# Patient Record
Sex: Female | Born: 1955 | Race: Black or African American | Hispanic: No | Smoking: Current every day smoker
Health system: Southern US, Community
[De-identification: ages and names within clinical notes are randomized; demographics above are authoritative.]

## PROBLEM LIST (undated history)

## (undated) ENCOUNTER — Inpatient Hospital Stay (HOSPITAL_COMMUNITY): Payer: Self-pay

## (undated) ENCOUNTER — Emergency Department (HOSPITAL_BASED_OUTPATIENT_CLINIC_OR_DEPARTMENT_OTHER): Admission: EM | Payer: PRIVATE HEALTH INSURANCE

## (undated) DIAGNOSIS — G4730 Sleep apnea, unspecified: Secondary | ICD-10-CM

## (undated) DIAGNOSIS — M1990 Unspecified osteoarthritis, unspecified site: Secondary | ICD-10-CM

## (undated) DIAGNOSIS — K5900 Constipation, unspecified: Secondary | ICD-10-CM

## (undated) DIAGNOSIS — K219 Gastro-esophageal reflux disease without esophagitis: Secondary | ICD-10-CM

## (undated) DIAGNOSIS — D649 Anemia, unspecified: Secondary | ICD-10-CM

## (undated) DIAGNOSIS — R1310 Dysphagia, unspecified: Secondary | ICD-10-CM

## (undated) DIAGNOSIS — E119 Type 2 diabetes mellitus without complications: Secondary | ICD-10-CM

## (undated) DIAGNOSIS — M351 Other overlap syndromes: Secondary | ICD-10-CM

## (undated) DIAGNOSIS — IMO0002 Reserved for concepts with insufficient information to code with codable children: Secondary | ICD-10-CM

## (undated) DIAGNOSIS — E559 Vitamin D deficiency, unspecified: Secondary | ICD-10-CM

## (undated) DIAGNOSIS — F419 Anxiety disorder, unspecified: Secondary | ICD-10-CM

## (undated) DIAGNOSIS — M810 Age-related osteoporosis without current pathological fracture: Secondary | ICD-10-CM

## (undated) DIAGNOSIS — F329 Major depressive disorder, single episode, unspecified: Secondary | ICD-10-CM

## (undated) DIAGNOSIS — G40909 Epilepsy, unspecified, not intractable, without status epilepticus: Secondary | ICD-10-CM

## (undated) DIAGNOSIS — R079 Chest pain, unspecified: Secondary | ICD-10-CM

## (undated) DIAGNOSIS — K259 Gastric ulcer, unspecified as acute or chronic, without hemorrhage or perforation: Secondary | ICD-10-CM

## (undated) DIAGNOSIS — M549 Dorsalgia, unspecified: Secondary | ICD-10-CM

## (undated) DIAGNOSIS — E079 Disorder of thyroid, unspecified: Secondary | ICD-10-CM

## (undated) DIAGNOSIS — R600 Localized edema: Secondary | ICD-10-CM

## (undated) DIAGNOSIS — M2550 Pain in unspecified joint: Secondary | ICD-10-CM

## (undated) DIAGNOSIS — E785 Hyperlipidemia, unspecified: Secondary | ICD-10-CM

## (undated) DIAGNOSIS — T7840XA Allergy, unspecified, initial encounter: Secondary | ICD-10-CM

## (undated) DIAGNOSIS — I10 Essential (primary) hypertension: Secondary | ICD-10-CM

## (undated) DIAGNOSIS — F32A Depression, unspecified: Secondary | ICD-10-CM

## (undated) HISTORY — DX: Hyperlipidemia, unspecified: E78.5

## (undated) HISTORY — DX: Anemia, unspecified: D64.9

## (undated) HISTORY — DX: Chest pain, unspecified: R07.9

## (undated) HISTORY — DX: Epilepsy, unspecified, not intractable, without status epilepticus: G40.909

## (undated) HISTORY — DX: Age-related osteoporosis without current pathological fracture: M81.0

## (undated) HISTORY — DX: Major depressive disorder, single episode, unspecified: F32.9

## (undated) HISTORY — DX: Localized edema: R60.0

## (undated) HISTORY — DX: Disorder of thyroid, unspecified: E07.9

## (undated) HISTORY — DX: Type 2 diabetes mellitus without complications: E11.9

## (undated) HISTORY — DX: Other overlap syndromes: M35.1

## (undated) HISTORY — DX: Dorsalgia, unspecified: M54.9

## (undated) HISTORY — DX: Allergy, unspecified, initial encounter: T78.40XA

## (undated) HISTORY — DX: Sleep apnea, unspecified: G47.30

## (undated) HISTORY — PX: CERVICAL SPINE SURGERY: SHX589

## (undated) HISTORY — DX: Dysphagia, unspecified: R13.10

## (undated) HISTORY — DX: Reserved for concepts with insufficient information to code with codable children: IMO0002

## (undated) HISTORY — DX: Pain in unspecified joint: M25.50

## (undated) HISTORY — DX: Gastric ulcer, unspecified as acute or chronic, without hemorrhage or perforation: K25.9

## (undated) HISTORY — DX: Essential (primary) hypertension: I10

## (undated) HISTORY — DX: Constipation, unspecified: K59.00

## (undated) HISTORY — DX: Depression, unspecified: F32.A

## (undated) HISTORY — DX: Vitamin D deficiency, unspecified: E55.9

## (undated) HISTORY — DX: Anxiety disorder, unspecified: F41.9

## (undated) HISTORY — DX: Unspecified osteoarthritis, unspecified site: M19.90

## (undated) HISTORY — PX: CARPAL TUNNEL RELEASE: SHX101

## (undated) HISTORY — PX: LEG SURGERY: SHX1003

## (undated) HISTORY — DX: Gastro-esophageal reflux disease without esophagitis: K21.9

---

## 1998-06-06 ENCOUNTER — Ambulatory Visit (HOSPITAL_COMMUNITY): Admission: RE | Admit: 1998-06-06 | Discharge: 1998-06-06 | Payer: Self-pay | Admitting: Endocrinology

## 1998-08-01 ENCOUNTER — Ambulatory Visit (HOSPITAL_COMMUNITY): Admission: RE | Admit: 1998-08-01 | Discharge: 1998-08-01 | Payer: Self-pay

## 1998-08-03 ENCOUNTER — Observation Stay (HOSPITAL_COMMUNITY): Admission: RE | Admit: 1998-08-03 | Discharge: 1998-08-04 | Payer: Self-pay

## 1999-07-11 ENCOUNTER — Ambulatory Visit (HOSPITAL_COMMUNITY): Admission: RE | Admit: 1999-07-11 | Discharge: 1999-07-11 | Payer: Self-pay | Admitting: Orthopedic Surgery

## 1999-07-11 ENCOUNTER — Encounter: Payer: Self-pay | Admitting: Orthopedic Surgery

## 1999-08-17 ENCOUNTER — Encounter: Payer: Self-pay | Admitting: Neurosurgery

## 1999-08-17 ENCOUNTER — Ambulatory Visit (HOSPITAL_COMMUNITY): Admission: RE | Admit: 1999-08-17 | Discharge: 1999-08-17 | Payer: Self-pay | Admitting: Neurosurgery

## 1999-09-25 ENCOUNTER — Observation Stay (HOSPITAL_COMMUNITY): Admission: RE | Admit: 1999-09-25 | Discharge: 1999-09-26 | Payer: Self-pay | Admitting: Neurosurgery

## 1999-09-25 ENCOUNTER — Encounter: Payer: Self-pay | Admitting: Neurosurgery

## 1999-10-30 ENCOUNTER — Ambulatory Visit (HOSPITAL_COMMUNITY): Admission: RE | Admit: 1999-10-30 | Discharge: 1999-10-30 | Payer: Self-pay | Admitting: Neurosurgery

## 1999-10-30 ENCOUNTER — Encounter: Payer: Self-pay | Admitting: Neurosurgery

## 1999-12-03 ENCOUNTER — Ambulatory Visit (HOSPITAL_COMMUNITY): Admission: RE | Admit: 1999-12-03 | Discharge: 1999-12-03 | Payer: Self-pay | Admitting: Neurosurgery

## 1999-12-03 ENCOUNTER — Encounter: Payer: Self-pay | Admitting: Neurosurgery

## 2000-01-03 ENCOUNTER — Other Ambulatory Visit: Admission: RE | Admit: 2000-01-03 | Discharge: 2000-01-03 | Payer: Self-pay | Admitting: Family Medicine

## 2000-04-07 ENCOUNTER — Other Ambulatory Visit: Admission: RE | Admit: 2000-04-07 | Discharge: 2000-04-07 | Payer: Self-pay | Admitting: Family Medicine

## 2000-05-27 ENCOUNTER — Other Ambulatory Visit: Admission: RE | Admit: 2000-05-27 | Discharge: 2000-05-27 | Payer: Self-pay | Admitting: Obstetrics and Gynecology

## 2000-05-27 ENCOUNTER — Encounter (INDEPENDENT_AMBULATORY_CARE_PROVIDER_SITE_OTHER): Payer: Self-pay | Admitting: Specialist

## 2000-08-09 ENCOUNTER — Ambulatory Visit (HOSPITAL_COMMUNITY): Admission: RE | Admit: 2000-08-09 | Discharge: 2000-08-09 | Payer: Self-pay | Admitting: Obstetrics and Gynecology

## 2000-08-09 ENCOUNTER — Encounter (INDEPENDENT_AMBULATORY_CARE_PROVIDER_SITE_OTHER): Payer: Self-pay

## 2000-12-26 ENCOUNTER — Other Ambulatory Visit: Admission: RE | Admit: 2000-12-26 | Discharge: 2000-12-26 | Payer: Self-pay | Admitting: Family Medicine

## 2001-02-04 ENCOUNTER — Other Ambulatory Visit: Admission: RE | Admit: 2001-02-04 | Discharge: 2001-02-04 | Payer: Self-pay | Admitting: Obstetrics and Gynecology

## 2001-03-11 ENCOUNTER — Encounter: Payer: Self-pay | Admitting: Gastroenterology

## 2001-03-11 ENCOUNTER — Encounter: Admission: RE | Admit: 2001-03-11 | Discharge: 2001-03-11 | Payer: Self-pay | Admitting: Otolaryngology

## 2001-03-11 ENCOUNTER — Encounter: Payer: Self-pay | Admitting: Otolaryngology

## 2001-03-13 ENCOUNTER — Encounter: Admission: RE | Admit: 2001-03-13 | Discharge: 2001-03-13 | Payer: Self-pay | Admitting: Neurosurgery

## 2001-03-13 ENCOUNTER — Encounter: Payer: Self-pay | Admitting: Neurosurgery

## 2002-06-19 ENCOUNTER — Ambulatory Visit (HOSPITAL_COMMUNITY): Admission: RE | Admit: 2002-06-19 | Discharge: 2002-06-19 | Payer: Self-pay | Admitting: Family Medicine

## 2002-06-19 ENCOUNTER — Encounter: Payer: Self-pay | Admitting: Family Medicine

## 2003-03-10 ENCOUNTER — Encounter: Admission: RE | Admit: 2003-03-10 | Discharge: 2003-03-10 | Payer: Self-pay | Admitting: Family Medicine

## 2003-03-10 ENCOUNTER — Encounter: Payer: Self-pay | Admitting: Family Medicine

## 2003-05-11 ENCOUNTER — Encounter: Payer: Self-pay | Admitting: Orthopedic Surgery

## 2003-05-12 ENCOUNTER — Inpatient Hospital Stay (HOSPITAL_COMMUNITY): Admission: RE | Admit: 2003-05-12 | Discharge: 2003-05-13 | Payer: Self-pay | Admitting: Orthopedic Surgery

## 2004-05-18 ENCOUNTER — Encounter: Admission: RE | Admit: 2004-05-18 | Discharge: 2004-08-16 | Payer: Self-pay | Admitting: Orthopaedic Surgery

## 2004-10-03 ENCOUNTER — Ambulatory Visit (HOSPITAL_COMMUNITY): Admission: RE | Admit: 2004-10-03 | Discharge: 2004-10-03 | Payer: Self-pay | Admitting: Neurosurgery

## 2004-10-05 ENCOUNTER — Ambulatory Visit (HOSPITAL_COMMUNITY): Admission: RE | Admit: 2004-10-05 | Discharge: 2004-10-05 | Payer: Self-pay | Admitting: Neurosurgery

## 2004-10-09 ENCOUNTER — Ambulatory Visit: Payer: Self-pay | Admitting: Family Medicine

## 2004-10-15 ENCOUNTER — Ambulatory Visit: Payer: Self-pay | Admitting: Family Medicine

## 2004-11-02 ENCOUNTER — Ambulatory Visit: Payer: Self-pay | Admitting: Family Medicine

## 2004-11-13 ENCOUNTER — Ambulatory Visit (HOSPITAL_COMMUNITY): Admission: RE | Admit: 2004-11-13 | Discharge: 2004-11-13 | Payer: Self-pay | Admitting: Neurosurgery

## 2004-12-12 ENCOUNTER — Ambulatory Visit (HOSPITAL_COMMUNITY): Admission: RE | Admit: 2004-12-12 | Discharge: 2004-12-12 | Payer: Self-pay | Admitting: Neurosurgery

## 2004-12-25 ENCOUNTER — Ambulatory Visit: Payer: Self-pay | Admitting: Family Medicine

## 2004-12-26 ENCOUNTER — Ambulatory Visit (HOSPITAL_BASED_OUTPATIENT_CLINIC_OR_DEPARTMENT_OTHER): Admission: RE | Admit: 2004-12-26 | Discharge: 2004-12-26 | Payer: Self-pay | Admitting: General Surgery

## 2004-12-26 ENCOUNTER — Encounter (INDEPENDENT_AMBULATORY_CARE_PROVIDER_SITE_OTHER): Payer: Self-pay | Admitting: Specialist

## 2004-12-26 ENCOUNTER — Ambulatory Visit (HOSPITAL_COMMUNITY): Admission: RE | Admit: 2004-12-26 | Discharge: 2004-12-26 | Payer: Self-pay | Admitting: General Surgery

## 2005-03-14 ENCOUNTER — Ambulatory Visit: Payer: Self-pay | Admitting: Family Medicine

## 2005-03-22 ENCOUNTER — Ambulatory Visit: Payer: Self-pay | Admitting: Family Medicine

## 2005-04-22 ENCOUNTER — Ambulatory Visit: Payer: Self-pay | Admitting: Family Medicine

## 2005-05-22 ENCOUNTER — Inpatient Hospital Stay (HOSPITAL_COMMUNITY): Admission: RE | Admit: 2005-05-22 | Discharge: 2005-05-26 | Payer: Self-pay | Admitting: Neurosurgery

## 2005-08-29 ENCOUNTER — Encounter: Admission: RE | Admit: 2005-08-29 | Discharge: 2005-08-29 | Payer: Self-pay | Admitting: Neurosurgery

## 2005-09-18 ENCOUNTER — Ambulatory Visit: Payer: Self-pay | Admitting: Family Medicine

## 2005-10-31 ENCOUNTER — Ambulatory Visit: Payer: Self-pay | Admitting: Family Medicine

## 2005-11-11 ENCOUNTER — Encounter: Admission: RE | Admit: 2005-11-11 | Discharge: 2005-11-11 | Payer: Self-pay | Admitting: Family Medicine

## 2005-11-15 ENCOUNTER — Encounter: Admission: RE | Admit: 2005-11-15 | Discharge: 2005-12-31 | Payer: Self-pay | Admitting: Neurosurgery

## 2005-11-29 ENCOUNTER — Encounter: Admission: RE | Admit: 2005-11-29 | Discharge: 2005-11-29 | Payer: Self-pay | Admitting: Family Medicine

## 2006-01-16 ENCOUNTER — Encounter: Admission: RE | Admit: 2006-01-16 | Discharge: 2006-04-16 | Payer: Self-pay | Admitting: *Deleted

## 2006-01-23 ENCOUNTER — Ambulatory Visit: Payer: Self-pay | Admitting: Family Medicine

## 2006-02-06 ENCOUNTER — Encounter: Admission: RE | Admit: 2006-02-06 | Discharge: 2006-02-20 | Payer: Self-pay | Admitting: Family Medicine

## 2006-02-17 ENCOUNTER — Ambulatory Visit: Payer: Self-pay | Admitting: Family Medicine

## 2006-03-31 ENCOUNTER — Ambulatory Visit: Payer: Self-pay | Admitting: Family Medicine

## 2006-07-25 ENCOUNTER — Ambulatory Visit: Payer: Self-pay | Admitting: Family Medicine

## 2006-08-15 ENCOUNTER — Emergency Department (HOSPITAL_COMMUNITY): Admission: EM | Admit: 2006-08-15 | Discharge: 2006-08-15 | Payer: Self-pay | Admitting: Emergency Medicine

## 2006-10-17 ENCOUNTER — Ambulatory Visit: Payer: Self-pay | Admitting: Family Medicine

## 2006-10-20 ENCOUNTER — Ambulatory Visit: Payer: Self-pay | Admitting: Family Medicine

## 2006-10-20 LAB — CONVERTED CEMR LAB
BUN: 8 mg/dL (ref 6–23)
Basophils Absolute: 0 10*3/uL (ref 0.0–0.1)
Basophils Relative: 0.5 % (ref 0.0–1.0)
CO2: 30 meq/L (ref 19–32)
Calcium: 9.2 mg/dL (ref 8.4–10.5)
Chloride: 111 meq/L (ref 96–112)
Creatinine, Ser: 0.7 mg/dL (ref 0.4–1.2)
Eosinophil percent: 1.6 % (ref 0.0–5.0)
GFR calc non Af Amer: 94 mL/min
Glomerular Filtration Rate, Af Am: 113 mL/min/{1.73_m2}
Glucose, Bld: 90 mg/dL (ref 70–99)
HCT: 38.9 % (ref 36.0–46.0)
Hemoglobin: 12.7 g/dL (ref 12.0–15.0)
Lymphocytes Relative: 37.8 % (ref 12.0–46.0)
MCHC: 32.6 g/dL (ref 30.0–36.0)
MCV: 85.6 fL (ref 78.0–100.0)
Monocytes Absolute: 0.6 10*3/uL (ref 0.2–0.7)
Monocytes Relative: 7.9 % (ref 3.0–11.0)
Neutro Abs: 3.8 10*3/uL (ref 1.4–7.7)
Neutrophils Relative %: 52.2 % (ref 43.0–77.0)
Platelets: 312 10*3/uL (ref 150–400)
Potassium: 3.5 meq/L (ref 3.5–5.1)
RBC: 4.54 M/uL (ref 3.87–5.11)
RDW: 13.4 % (ref 11.5–14.6)
Sodium: 145 meq/L (ref 135–145)
WBC: 7.3 10*3/uL (ref 4.5–10.5)

## 2006-11-06 ENCOUNTER — Observation Stay (HOSPITAL_COMMUNITY): Admission: EM | Admit: 2006-11-06 | Discharge: 2006-11-07 | Payer: Self-pay | Admitting: Emergency Medicine

## 2006-11-06 ENCOUNTER — Ambulatory Visit: Payer: Self-pay | Admitting: Endocrinology

## 2006-11-12 ENCOUNTER — Ambulatory Visit: Payer: Self-pay | Admitting: Family Medicine

## 2006-11-14 ENCOUNTER — Ambulatory Visit: Payer: Self-pay

## 2006-12-25 ENCOUNTER — Ambulatory Visit: Payer: Self-pay | Admitting: Pulmonary Disease

## 2006-12-31 ENCOUNTER — Ambulatory Visit (HOSPITAL_COMMUNITY): Admission: RE | Admit: 2006-12-31 | Discharge: 2006-12-31 | Payer: Self-pay | Admitting: Pulmonary Disease

## 2007-01-06 ENCOUNTER — Ambulatory Visit: Payer: Self-pay | Admitting: Internal Medicine

## 2007-01-22 ENCOUNTER — Ambulatory Visit: Payer: Self-pay | Admitting: Family Medicine

## 2007-01-22 LAB — CONVERTED CEMR LAB: TSH: 0.69 microintl units/mL (ref 0.35–5.50)

## 2007-02-12 ENCOUNTER — Ambulatory Visit: Payer: Self-pay | Admitting: Family Medicine

## 2007-02-20 ENCOUNTER — Encounter: Payer: Self-pay | Admitting: Family Medicine

## 2007-02-20 ENCOUNTER — Ambulatory Visit: Payer: Self-pay | Admitting: Pulmonary Disease

## 2007-04-30 ENCOUNTER — Ambulatory Visit: Payer: Self-pay | Admitting: Family Medicine

## 2007-04-30 DIAGNOSIS — G4700 Insomnia, unspecified: Secondary | ICD-10-CM

## 2007-04-30 DIAGNOSIS — R1011 Right upper quadrant pain: Secondary | ICD-10-CM

## 2007-04-30 DIAGNOSIS — H81399 Other peripheral vertigo, unspecified ear: Secondary | ICD-10-CM | POA: Insufficient documentation

## 2007-04-30 DIAGNOSIS — Z8679 Personal history of other diseases of the circulatory system: Secondary | ICD-10-CM | POA: Insufficient documentation

## 2007-04-30 HISTORY — DX: Insomnia, unspecified: G47.00

## 2007-04-30 HISTORY — DX: Right upper quadrant pain: R10.11

## 2007-04-30 HISTORY — DX: Other peripheral vertigo, unspecified ear: H81.399

## 2007-05-08 ENCOUNTER — Encounter: Admission: RE | Admit: 2007-05-08 | Discharge: 2007-05-08 | Payer: Self-pay | Admitting: Family Medicine

## 2007-05-08 ENCOUNTER — Encounter: Payer: Self-pay | Admitting: Family Medicine

## 2007-05-11 ENCOUNTER — Telehealth (INDEPENDENT_AMBULATORY_CARE_PROVIDER_SITE_OTHER): Payer: Self-pay | Admitting: *Deleted

## 2007-05-15 ENCOUNTER — Encounter: Admission: RE | Admit: 2007-05-15 | Discharge: 2007-05-21 | Payer: Self-pay | Admitting: Family Medicine

## 2007-05-15 ENCOUNTER — Encounter: Payer: Self-pay | Admitting: Family Medicine

## 2007-07-16 ENCOUNTER — Ambulatory Visit: Payer: Self-pay | Admitting: Family Medicine

## 2007-07-16 DIAGNOSIS — M545 Low back pain, unspecified: Secondary | ICD-10-CM | POA: Insufficient documentation

## 2007-07-16 DIAGNOSIS — K219 Gastro-esophageal reflux disease without esophagitis: Secondary | ICD-10-CM

## 2007-07-16 DIAGNOSIS — M25569 Pain in unspecified knee: Secondary | ICD-10-CM

## 2007-07-16 DIAGNOSIS — R634 Abnormal weight loss: Secondary | ICD-10-CM

## 2007-07-16 DIAGNOSIS — K449 Diaphragmatic hernia without obstruction or gangrene: Secondary | ICD-10-CM

## 2007-07-16 DIAGNOSIS — M359 Systemic involvement of connective tissue, unspecified: Secondary | ICD-10-CM

## 2007-07-16 HISTORY — DX: Diaphragmatic hernia without obstruction or gangrene: K44.9

## 2007-07-16 HISTORY — DX: Pain in unspecified knee: M25.569

## 2007-07-16 HISTORY — DX: Gastro-esophageal reflux disease without esophagitis: K21.9

## 2007-07-16 HISTORY — DX: Low back pain, unspecified: M54.50

## 2007-07-17 ENCOUNTER — Ambulatory Visit: Payer: Self-pay | Admitting: Family Medicine

## 2007-07-20 ENCOUNTER — Encounter (INDEPENDENT_AMBULATORY_CARE_PROVIDER_SITE_OTHER): Payer: Self-pay | Admitting: *Deleted

## 2007-07-20 ENCOUNTER — Telehealth: Payer: Self-pay | Admitting: Family Medicine

## 2007-07-20 LAB — CONVERTED CEMR LAB
ALT: 15 units/L (ref 0–35)
AST: 20 units/L (ref 0–37)
Albumin: 4.1 g/dL (ref 3.5–5.2)
Alkaline Phosphatase: 81 units/L (ref 39–117)
BUN: 9 mg/dL (ref 6–23)
Basophils Absolute: 0 10*3/uL (ref 0.0–0.1)
Basophils Relative: 0.4 % (ref 0.0–1.0)
Bilirubin, Direct: 0.1 mg/dL (ref 0.0–0.3)
CO2: 31 meq/L (ref 19–32)
Calcium: 9.8 mg/dL (ref 8.4–10.5)
Chloride: 105 meq/L (ref 96–112)
Creatinine, Ser: 0.6 mg/dL (ref 0.4–1.2)
Eosinophils Absolute: 0.2 10*3/uL (ref 0.0–0.6)
Eosinophils Relative: 3.8 % (ref 0.0–5.0)
GFR calc Af Amer: 136 mL/min
GFR calc non Af Amer: 112 mL/min
Glucose, Bld: 83 mg/dL (ref 70–99)
HCT: 33.4 % — ABNORMAL LOW (ref 36.0–46.0)
Hemoglobin: 11.7 g/dL — ABNORMAL LOW (ref 12.0–15.0)
Lymphocytes Relative: 43.8 % (ref 12.0–46.0)
MCHC: 34.9 g/dL (ref 30.0–36.0)
MCV: 80.3 fL (ref 78.0–100.0)
Monocytes Absolute: 0.5 10*3/uL (ref 0.2–0.7)
Monocytes Relative: 9.5 % (ref 3.0–11.0)
Neutro Abs: 2 10*3/uL (ref 1.4–7.7)
Neutrophils Relative %: 42.5 % — ABNORMAL LOW (ref 43.0–77.0)
Platelets: 299 10*3/uL (ref 150–400)
Potassium: 4.5 meq/L (ref 3.5–5.1)
RBC: 4.16 M/uL (ref 3.87–5.11)
RDW: 12.6 % (ref 11.5–14.6)
Sodium: 143 meq/L (ref 135–145)
TSH: 1.32 microintl units/mL (ref 0.35–5.50)
Total Bilirubin: 0.7 mg/dL (ref 0.3–1.2)
Total Protein: 7.6 g/dL (ref 6.0–8.3)
WBC: 4.8 10*3/uL (ref 4.5–10.5)

## 2007-08-14 ENCOUNTER — Encounter: Payer: Self-pay | Admitting: Family Medicine

## 2007-08-21 ENCOUNTER — Encounter: Payer: Self-pay | Admitting: Family Medicine

## 2007-09-03 ENCOUNTER — Telehealth (INDEPENDENT_AMBULATORY_CARE_PROVIDER_SITE_OTHER): Payer: Self-pay | Admitting: *Deleted

## 2007-09-03 ENCOUNTER — Encounter: Admission: RE | Admit: 2007-09-03 | Discharge: 2007-10-01 | Payer: Self-pay | Admitting: Orthopedic Surgery

## 2007-10-01 ENCOUNTER — Ambulatory Visit: Payer: Self-pay | Admitting: Family Medicine

## 2007-10-01 ENCOUNTER — Telehealth: Payer: Self-pay | Admitting: Family Medicine

## 2008-01-08 ENCOUNTER — Telehealth (INDEPENDENT_AMBULATORY_CARE_PROVIDER_SITE_OTHER): Payer: Self-pay | Admitting: *Deleted

## 2008-01-26 ENCOUNTER — Telehealth (INDEPENDENT_AMBULATORY_CARE_PROVIDER_SITE_OTHER): Payer: Self-pay | Admitting: *Deleted

## 2008-01-29 ENCOUNTER — Ambulatory Visit: Payer: Self-pay | Admitting: Family Medicine

## 2008-01-29 DIAGNOSIS — F411 Generalized anxiety disorder: Secondary | ICD-10-CM

## 2008-02-12 ENCOUNTER — Ambulatory Visit: Payer: Self-pay | Admitting: Family Medicine

## 2008-02-12 ENCOUNTER — Telehealth (INDEPENDENT_AMBULATORY_CARE_PROVIDER_SITE_OTHER): Payer: Self-pay | Admitting: *Deleted

## 2008-02-12 DIAGNOSIS — R002 Palpitations: Secondary | ICD-10-CM | POA: Insufficient documentation

## 2008-02-12 HISTORY — DX: Palpitations: R00.2

## 2008-02-15 ENCOUNTER — Encounter (INDEPENDENT_AMBULATORY_CARE_PROVIDER_SITE_OTHER): Payer: Self-pay | Admitting: *Deleted

## 2008-02-18 ENCOUNTER — Ambulatory Visit: Payer: Self-pay

## 2008-03-02 ENCOUNTER — Encounter: Payer: Self-pay | Admitting: Family Medicine

## 2008-03-21 ENCOUNTER — Encounter: Payer: Self-pay | Admitting: Family Medicine

## 2008-03-24 ENCOUNTER — Encounter: Payer: Self-pay | Admitting: Family Medicine

## 2008-04-04 ENCOUNTER — Encounter (INDEPENDENT_AMBULATORY_CARE_PROVIDER_SITE_OTHER): Payer: Self-pay | Admitting: *Deleted

## 2008-05-06 ENCOUNTER — Ambulatory Visit: Payer: Self-pay | Admitting: Family Medicine

## 2008-05-06 DIAGNOSIS — J069 Acute upper respiratory infection, unspecified: Secondary | ICD-10-CM

## 2008-05-06 DIAGNOSIS — B070 Plantar wart: Secondary | ICD-10-CM

## 2008-05-06 LAB — CONVERTED CEMR LAB: Rapid Strep: NEGATIVE

## 2008-06-09 ENCOUNTER — Telehealth (INDEPENDENT_AMBULATORY_CARE_PROVIDER_SITE_OTHER): Payer: Self-pay | Admitting: *Deleted

## 2008-06-09 ENCOUNTER — Encounter: Payer: Self-pay | Admitting: Family Medicine

## 2008-10-14 ENCOUNTER — Telehealth (INDEPENDENT_AMBULATORY_CARE_PROVIDER_SITE_OTHER): Payer: Self-pay | Admitting: *Deleted

## 2008-10-20 ENCOUNTER — Telehealth (INDEPENDENT_AMBULATORY_CARE_PROVIDER_SITE_OTHER): Payer: Self-pay | Admitting: *Deleted

## 2008-11-17 ENCOUNTER — Ambulatory Visit: Payer: Self-pay | Admitting: Family Medicine

## 2008-11-17 DIAGNOSIS — M79609 Pain in unspecified limb: Secondary | ICD-10-CM

## 2008-11-17 HISTORY — DX: Pain in unspecified limb: M79.609

## 2008-12-01 ENCOUNTER — Ambulatory Visit: Payer: Self-pay | Admitting: Family Medicine

## 2008-12-01 DIAGNOSIS — G2581 Restless legs syndrome: Secondary | ICD-10-CM

## 2008-12-01 DIAGNOSIS — Z720 Tobacco use: Secondary | ICD-10-CM | POA: Insufficient documentation

## 2008-12-01 HISTORY — DX: Restless legs syndrome: G25.81

## 2008-12-08 ENCOUNTER — Encounter: Payer: Self-pay | Admitting: Family Medicine

## 2008-12-08 ENCOUNTER — Ambulatory Visit: Payer: Self-pay

## 2008-12-13 ENCOUNTER — Telehealth (INDEPENDENT_AMBULATORY_CARE_PROVIDER_SITE_OTHER): Payer: Self-pay | Admitting: *Deleted

## 2008-12-13 ENCOUNTER — Telehealth: Payer: Self-pay | Admitting: Family Medicine

## 2008-12-13 LAB — CONVERTED CEMR LAB
BUN: 9 mg/dL (ref 6–23)
Basophils Absolute: 0.1 10*3/uL (ref 0.0–0.1)
Basophils Relative: 2 % (ref 0.0–3.0)
CO2: 0 meq/L — CL (ref 19–32)
Calcium: 9.3 mg/dL (ref 8.4–10.5)
Chloride: 112 meq/L (ref 96–112)
Creatinine, Ser: 0.6 mg/dL (ref 0.4–1.2)
Eosinophils Absolute: 0.1 10*3/uL (ref 0.0–0.7)
Eosinophils Relative: 2.9 % (ref 0.0–5.0)
GFR calc Af Amer: 134 mL/min
GFR calc non Af Amer: 111 mL/min
Glucose, Bld: 108 mg/dL — ABNORMAL HIGH (ref 70–99)
HCT: 35.5 % — ABNORMAL LOW (ref 36.0–46.0)
Hemoglobin: 12 g/dL (ref 12.0–15.0)
Lymphocytes Relative: 40.4 % (ref 12.0–46.0)
MCHC: 33.7 g/dL (ref 30.0–36.0)
MCV: 84.3 fL (ref 78.0–100.0)
Monocytes Absolute: 0.3 10*3/uL (ref 0.1–1.0)
Monocytes Relative: 6 % (ref 3.0–12.0)
Neutro Abs: 2.2 10*3/uL (ref 1.4–7.7)
Neutrophils Relative %: 48.7 % (ref 43.0–77.0)
Platelets: 229 10*3/uL (ref 150–400)
Potassium: 4.6 meq/L (ref 3.5–5.1)
RBC: 4.22 M/uL (ref 3.87–5.11)
RDW: 12.6 % (ref 11.5–14.6)
Sodium: 143 meq/L (ref 135–145)
TSH: 2.28 microintl units/mL (ref 0.35–5.50)
WBC: 4.5 10*3/uL (ref 4.5–10.5)

## 2008-12-15 ENCOUNTER — Telehealth (INDEPENDENT_AMBULATORY_CARE_PROVIDER_SITE_OTHER): Payer: Self-pay | Admitting: *Deleted

## 2009-01-25 ENCOUNTER — Telehealth (INDEPENDENT_AMBULATORY_CARE_PROVIDER_SITE_OTHER): Payer: Self-pay | Admitting: *Deleted

## 2009-06-01 ENCOUNTER — Telehealth: Payer: Self-pay | Admitting: Family Medicine

## 2009-06-02 ENCOUNTER — Encounter: Payer: Self-pay | Admitting: Family Medicine

## 2009-06-08 ENCOUNTER — Ambulatory Visit: Payer: Self-pay | Admitting: Family Medicine

## 2009-06-08 DIAGNOSIS — R079 Chest pain, unspecified: Secondary | ICD-10-CM | POA: Insufficient documentation

## 2009-06-08 DIAGNOSIS — R0789 Other chest pain: Secondary | ICD-10-CM

## 2009-06-09 ENCOUNTER — Encounter (INDEPENDENT_AMBULATORY_CARE_PROVIDER_SITE_OTHER): Payer: Self-pay | Admitting: *Deleted

## 2009-07-13 ENCOUNTER — Ambulatory Visit: Payer: Self-pay | Admitting: Gastroenterology

## 2009-07-13 DIAGNOSIS — R198 Other specified symptoms and signs involving the digestive system and abdomen: Secondary | ICD-10-CM | POA: Insufficient documentation

## 2009-07-24 ENCOUNTER — Telehealth: Payer: Self-pay | Admitting: Family Medicine

## 2009-07-27 ENCOUNTER — Encounter: Payer: Self-pay | Admitting: Gastroenterology

## 2009-07-27 ENCOUNTER — Ambulatory Visit: Payer: Self-pay | Admitting: Gastroenterology

## 2009-07-31 ENCOUNTER — Encounter: Payer: Self-pay | Admitting: Gastroenterology

## 2009-10-30 ENCOUNTER — Telehealth: Payer: Self-pay | Admitting: Family Medicine

## 2009-11-02 ENCOUNTER — Telehealth (INDEPENDENT_AMBULATORY_CARE_PROVIDER_SITE_OTHER): Payer: Self-pay | Admitting: *Deleted

## 2009-11-24 ENCOUNTER — Telehealth: Payer: Self-pay | Admitting: Family Medicine

## 2009-11-27 ENCOUNTER — Encounter (INDEPENDENT_AMBULATORY_CARE_PROVIDER_SITE_OTHER): Payer: Self-pay | Admitting: *Deleted

## 2009-11-27 ENCOUNTER — Telehealth (INDEPENDENT_AMBULATORY_CARE_PROVIDER_SITE_OTHER): Payer: Self-pay | Admitting: *Deleted

## 2010-01-11 ENCOUNTER — Encounter: Payer: Self-pay | Admitting: Family Medicine

## 2010-01-23 ENCOUNTER — Telehealth (INDEPENDENT_AMBULATORY_CARE_PROVIDER_SITE_OTHER): Payer: Self-pay | Admitting: *Deleted

## 2010-01-25 ENCOUNTER — Ambulatory Visit: Payer: Self-pay | Admitting: Family Medicine

## 2010-01-25 DIAGNOSIS — M5410 Radiculopathy, site unspecified: Secondary | ICD-10-CM | POA: Insufficient documentation

## 2010-01-25 DIAGNOSIS — R5383 Other fatigue: Secondary | ICD-10-CM

## 2010-01-25 DIAGNOSIS — R5381 Other malaise: Secondary | ICD-10-CM

## 2010-01-25 DIAGNOSIS — IMO0002 Reserved for concepts with insufficient information to code with codable children: Secondary | ICD-10-CM | POA: Insufficient documentation

## 2010-01-25 HISTORY — DX: Radiculopathy, site unspecified: M54.10

## 2010-01-25 HISTORY — DX: Other fatigue: R53.83

## 2010-02-09 ENCOUNTER — Encounter: Payer: Self-pay | Admitting: Family Medicine

## 2010-02-15 ENCOUNTER — Ambulatory Visit: Payer: Self-pay | Admitting: Cardiology

## 2010-02-15 ENCOUNTER — Encounter: Payer: Self-pay | Admitting: Family Medicine

## 2010-02-15 ENCOUNTER — Ambulatory Visit: Payer: Self-pay

## 2010-02-15 ENCOUNTER — Ambulatory Visit (HOSPITAL_COMMUNITY): Admission: RE | Admit: 2010-02-15 | Discharge: 2010-02-15 | Payer: Self-pay | Admitting: Family Medicine

## 2010-02-15 DIAGNOSIS — I519 Heart disease, unspecified: Secondary | ICD-10-CM | POA: Insufficient documentation

## 2010-02-15 HISTORY — DX: Heart disease, unspecified: I51.9

## 2010-02-22 ENCOUNTER — Encounter (INDEPENDENT_AMBULATORY_CARE_PROVIDER_SITE_OTHER): Payer: Self-pay | Admitting: *Deleted

## 2010-04-16 ENCOUNTER — Telehealth (INDEPENDENT_AMBULATORY_CARE_PROVIDER_SITE_OTHER): Payer: Self-pay | Admitting: *Deleted

## 2010-04-19 ENCOUNTER — Telehealth (INDEPENDENT_AMBULATORY_CARE_PROVIDER_SITE_OTHER): Payer: Self-pay | Admitting: *Deleted

## 2010-04-19 ENCOUNTER — Telehealth: Payer: Self-pay | Admitting: Family Medicine

## 2010-04-20 ENCOUNTER — Ambulatory Visit: Payer: Self-pay | Admitting: Family Medicine

## 2010-04-24 LAB — CONVERTED CEMR LAB
ALT: 16 units/L (ref 0–35)
AST: 25 units/L (ref 0–37)
Albumin: 4.2 g/dL (ref 3.5–5.2)
Alkaline Phosphatase: 77 units/L (ref 39–117)
BUN: 14 mg/dL (ref 6–23)
Basophils Absolute: 0 10*3/uL (ref 0.0–0.1)
Basophils Relative: 0.7 % (ref 0.0–3.0)
Bilirubin, Direct: 0.1 mg/dL (ref 0.0–0.3)
CO2: 29 meq/L (ref 19–32)
Calcium: 9.5 mg/dL (ref 8.4–10.5)
Chloride: 110 meq/L (ref 96–112)
Cholesterol: 201 mg/dL — ABNORMAL HIGH (ref 0–200)
Creatinine, Ser: 0.7 mg/dL (ref 0.4–1.2)
Direct LDL: 112 mg/dL
Eosinophils Absolute: 0.1 10*3/uL (ref 0.0–0.7)
Eosinophils Relative: 2.5 % (ref 0.0–5.0)
GFR calc non Af Amer: 97.22 mL/min (ref >60)
Glucose, Bld: 115 mg/dL — ABNORMAL HIGH (ref 70–99)
HCT: 35.2 % — ABNORMAL LOW (ref 36.0–46.0)
HDL: 73 mg/dL (ref >39.00)
Hemoglobin: 11.9 g/dL — ABNORMAL LOW (ref 12.0–15.0)
Lymphocytes Relative: 33.9 % (ref 12.0–46.0)
Lymphs Abs: 1.8 10*3/uL (ref 0.7–4.0)
MCHC: 33.7 g/dL (ref 30.0–36.0)
MCV: 84.6 fL (ref 78.0–100.0)
Monocytes Absolute: 0.4 10*3/uL (ref 0.1–1.0)
Monocytes Relative: 7.7 % (ref 3.0–12.0)
Neutro Abs: 2.9 10*3/uL (ref 1.4–7.7)
Neutrophils Relative %: 55.2 % (ref 43.0–77.0)
Platelets: 239 10*3/uL (ref 150.0–400.0)
Potassium: 4.6 meq/L (ref 3.5–5.1)
RBC: 4.16 M/uL (ref 3.87–5.11)
RDW: 14.2 % (ref 11.5–14.6)
Sodium: 143 meq/L (ref 135–145)
TSH: 2.34 microintl units/mL (ref 0.35–5.50)
Total Bilirubin: 0.6 mg/dL (ref 0.3–1.2)
Total CHOL/HDL Ratio: 3
Total Protein: 7.4 g/dL (ref 6.0–8.3)
Triglycerides: 81 mg/dL (ref 0.0–149.0)
VLDL: 16.2 mg/dL (ref 0.0–40.0)
WBC: 5.3 10*3/uL (ref 4.5–10.5)

## 2010-04-26 ENCOUNTER — Encounter: Payer: Self-pay | Admitting: Family Medicine

## 2010-05-04 ENCOUNTER — Ambulatory Visit: Payer: Self-pay | Admitting: Family Medicine

## 2010-05-04 DIAGNOSIS — D649 Anemia, unspecified: Secondary | ICD-10-CM | POA: Insufficient documentation

## 2010-05-04 DIAGNOSIS — R7309 Other abnormal glucose: Secondary | ICD-10-CM | POA: Insufficient documentation

## 2010-05-04 DIAGNOSIS — R413 Other amnesia: Secondary | ICD-10-CM

## 2010-05-04 HISTORY — DX: Other abnormal glucose: R73.09

## 2010-05-07 LAB — CONVERTED CEMR LAB
BUN: 12 mg/dL (ref 6–23)
CO2: 28 meq/L (ref 19–32)
Calcium: 9.5 mg/dL (ref 8.4–10.5)
Chloride: 113 meq/L — ABNORMAL HIGH (ref 96–112)
Creatinine, Ser: 0.6 mg/dL (ref 0.4–1.2)
GFR calc non Af Amer: 110.41 mL/min (ref >60)
Glucose, Bld: 109 mg/dL — ABNORMAL HIGH (ref 70–99)
Hgb A1c MFr Bld: 6.3 % (ref 4.6–6.5)
Potassium: 4.7 meq/L (ref 3.5–5.1)
Sodium: 145 meq/L (ref 135–145)

## 2010-05-31 ENCOUNTER — Ambulatory Visit: Payer: Self-pay | Admitting: Internal Medicine

## 2010-05-31 DIAGNOSIS — R072 Precordial pain: Secondary | ICD-10-CM | POA: Insufficient documentation

## 2010-06-21 ENCOUNTER — Ambulatory Visit (HOSPITAL_COMMUNITY): Admission: RE | Admit: 2010-06-21 | Discharge: 2010-06-21 | Payer: Self-pay | Admitting: Internal Medicine

## 2010-06-21 ENCOUNTER — Ambulatory Visit: Payer: Self-pay | Admitting: Internal Medicine

## 2010-06-21 ENCOUNTER — Ambulatory Visit: Payer: Self-pay

## 2010-06-21 ENCOUNTER — Encounter: Payer: Self-pay | Admitting: Internal Medicine

## 2010-06-21 ENCOUNTER — Ambulatory Visit: Payer: Self-pay | Admitting: Cardiology

## 2010-07-10 ENCOUNTER — Telehealth: Payer: Self-pay | Admitting: Internal Medicine

## 2010-08-31 ENCOUNTER — Encounter: Payer: Self-pay | Admitting: Family Medicine

## 2010-08-31 ENCOUNTER — Ambulatory Visit: Payer: Self-pay | Admitting: Family Medicine

## 2010-08-31 DIAGNOSIS — I10 Essential (primary) hypertension: Secondary | ICD-10-CM | POA: Insufficient documentation

## 2010-08-31 HISTORY — DX: Essential (primary) hypertension: I10

## 2010-10-05 ENCOUNTER — Ambulatory Visit: Payer: Self-pay | Admitting: Family Medicine

## 2010-11-02 ENCOUNTER — Other Ambulatory Visit: Payer: Self-pay | Admitting: Family Medicine

## 2010-11-02 ENCOUNTER — Ambulatory Visit
Admission: RE | Admit: 2010-11-02 | Discharge: 2010-11-02 | Payer: Self-pay | Source: Home / Self Care | Attending: Family Medicine | Admitting: Family Medicine

## 2010-11-02 DIAGNOSIS — E039 Hypothyroidism, unspecified: Secondary | ICD-10-CM | POA: Insufficient documentation

## 2010-11-02 DIAGNOSIS — M546 Pain in thoracic spine: Secondary | ICD-10-CM | POA: Insufficient documentation

## 2010-11-02 HISTORY — DX: Hypothyroidism, unspecified: E03.9

## 2010-11-02 LAB — HEPATIC FUNCTION PANEL
ALT: 16 U/L (ref 0–35)
AST: 27 U/L (ref 0–37)
Albumin: 3.9 g/dL (ref 3.5–5.2)
Alkaline Phosphatase: 79 U/L (ref 39–117)
Bilirubin, Direct: 0.1 mg/dL (ref 0.0–0.3)
Total Bilirubin: 0.8 mg/dL (ref 0.3–1.2)
Total Protein: 6.8 g/dL (ref 6.0–8.3)

## 2010-11-02 LAB — BASIC METABOLIC PANEL
BUN: 9 mg/dL (ref 6–23)
CO2: 25 mEq/L (ref 19–32)
Calcium: 9.3 mg/dL (ref 8.4–10.5)
Chloride: 109 mEq/L (ref 96–112)
Creatinine, Ser: 0.7 mg/dL (ref 0.4–1.2)
GFR: 97.03 mL/min (ref >60.00)
Glucose, Bld: 93 mg/dL (ref 70–99)
Potassium: 4.2 mEq/L (ref 3.5–5.1)
Sodium: 143 mEq/L (ref 135–145)

## 2010-11-02 LAB — CBC WITH DIFFERENTIAL/PLATELET
Basophils Absolute: 0 10*3/uL (ref 0.0–0.1)
Basophils Relative: 0.2 % (ref 0.0–3.0)
Eosinophils Absolute: 0.1 10*3/uL (ref 0.0–0.7)
Eosinophils Relative: 2.4 % (ref 0.0–5.0)
HCT: 35 % — ABNORMAL LOW (ref 36.0–46.0)
Hemoglobin: 11.9 g/dL — ABNORMAL LOW (ref 12.0–15.0)
Lymphocytes Relative: 33 % (ref 12.0–46.0)
Lymphs Abs: 1.7 10*3/uL (ref 0.7–4.0)
MCHC: 34 g/dL (ref 30.0–36.0)
MCV: 84.3 fl (ref 78.0–100.0)
Monocytes Absolute: 0.4 10*3/uL (ref 0.1–1.0)
Monocytes Relative: 7.8 % (ref 3.0–12.0)
Neutro Abs: 2.9 10*3/uL (ref 1.4–7.7)
Neutrophils Relative %: 56.6 % (ref 43.0–77.0)
Platelets: 228 10*3/uL (ref 150.0–400.0)
RBC: 4.15 Mil/uL (ref 3.87–5.11)
RDW: 14.2 % (ref 11.5–14.6)
WBC: 5.1 10*3/uL (ref 4.5–10.5)

## 2010-11-02 LAB — LIPID PANEL
Cholesterol: 197 mg/dL (ref 0–200)
HDL: 59.8 mg/dL (ref >39.00)
LDL Cholesterol: 123 mg/dL — ABNORMAL HIGH (ref 0–99)
Total CHOL/HDL Ratio: 3
Triglycerides: 69 mg/dL (ref 0.0–149.0)
VLDL: 13.8 mg/dL (ref 0.0–40.0)

## 2010-11-02 LAB — IBC PANEL
Iron: 62 ug/dL (ref 42–145)
Saturation Ratios: 17.2 % — ABNORMAL LOW (ref 20.0–50.0)
Transferrin: 257.1 mg/dL (ref 212.0–360.0)

## 2010-11-02 LAB — TSH: TSH: 1.26 u[IU]/mL (ref 0.35–5.50)

## 2010-11-25 LAB — CONVERTED CEMR LAB
BUN: 15 mg/dL (ref 6–23)
Basophils Absolute: 0 10*3/uL (ref 0.0–0.1)
Basophils Relative: 0 % (ref 0–1)
CO2: 26 meq/L (ref 19–32)
Calcium: 9.8 mg/dL (ref 8.4–10.5)
Chloride: 103 meq/L (ref 96–112)
Creatinine, Ser: 0.69 mg/dL (ref 0.40–1.20)
Eosinophils Absolute: 0.1 10*3/uL (ref 0.0–0.7)
Eosinophils Relative: 2 % (ref 0–5)
Glucose, Bld: 81 mg/dL (ref 70–99)
Glucose, Bld: 87 mg/dL
HCT: 34.8 % — ABNORMAL LOW (ref 36.0–46.0)
Hemoglobin: 11.5 g/dL — ABNORMAL LOW (ref 12.0–15.0)
Hemoglobin: 12.5 g/dL
Lymphocytes Relative: 37 % (ref 12–46)
Lymphs Abs: 1.7 10*3/uL (ref 0.7–4.0)
MCHC: 33 g/dL (ref 30.0–36.0)
MCV: 83.1 fL (ref 78.0–100.0)
Monocytes Absolute: 0.4 10*3/uL (ref 0.1–1.0)
Monocytes Relative: 8 % (ref 3–12)
Neutro Abs: 2.5 10*3/uL (ref 1.7–7.7)
Neutrophils Relative %: 53 % (ref 43–77)
Platelets: 274 10*3/uL (ref 150–400)
Potassium: 4.4 meq/L (ref 3.5–5.3)
RBC: 4.19 M/uL (ref 3.87–5.11)
RDW: 13.4 % (ref 11.5–15.5)
Sodium: 143 meq/L (ref 135–145)
TSH: 1.004 microintl units/mL (ref 0.350–5.50)
WBC: 4.7 10*3/uL (ref 4.0–10.5)

## 2010-11-27 NOTE — Progress Notes (Signed)
Summary: Refill Request  Phone Note Refill Request Message from:  Pharmacy on [address] fax # [phone]  Refills Requested: Medication #1:  SYNTHROID 75 MCG TABS take 1 tab once daily   Dosage confirmed as above?Dosage Confirmed   Brand Name Necessary? No   Last Refilled: 09/19/2009   Notes: Levothyroxine 0.075mg  Initial call taken by: Harold Barban,  November 27, 2009 11:04 AM    New/Updated Medications: SYNTHROID 75 MCG TABS (LEVOTHYROXINE SODIUM) take 1 tab once daily. NEEDS LABWORK BEFORE ADDITIONAL REFILLS. Prescriptions: SYNTHROID 75 MCG TABS (LEVOTHYROXINE SODIUM) take 1 tab once daily. NEEDS LABWORK BEFORE ADDITIONAL REFILLS.  #30 x 0   Entered by:   Army Fossa CMA   Authorized by:   Loreen Freud DO   Signed by:   Army Fossa CMA on 11/27/2009   Method used:   Electronically to        [facility] [phone]* (retail)       [address]       [place], [place]  [address]       Ph: [phone]       Fax: [phone]   RxID:   [id number]

## 2010-11-27 NOTE — Assessment & Plan Note (Signed)
Summary: f3y/diastolic dysfunction/jml  Medications Added RANITIDINE HCL 150 MG  CAPS (RANITIDINE HCL) 1 by mouth two times a day -- OUT NEURONTIN 300 MG CAPS (GABAPENTIN) TAKE ONE TABLET THREE TIMES DAILY. as needed SYNTHROID 75 MCG TABS (LEVOTHYROXINE SODIUM) take 1 tab once daily. FISH OIL   OIL (FISH OIL) 1200mg  once daily ACAI BERRY 500 MG CAPS (ACAI) once daily GINKGO BILOBA   EXTR (GINKGO BILOBA) once daily CALCIUM CARBONATE-VITAMIN D 600-400 MG-UNIT  TABS (CALCIUM CARBONATE-VITAMIN D) once daily VITAMIN D 400 UNIT  TABS (CHOLECALCIFEROL) once daily FLAX   OIL (FLAXSEED (LINSEED)) once daily      Allergies Added:   Visit Type:  follow-up 3 yrs Referring Provider:  Loreen Freud, DO  Primary Provider:  Loreen Freud, DO   CC:  numb in L arm -- palpatations.  History of Present Illness: Debra [name]  is a 55 year old woman with a history of mixed connective tissue disorder, tobacco use (quit Jan 2011), hypothyroidism and chest pain.  We saw her back in early 2008 for chest pain. Had a CT scan which was negative for PE.  Had Myoview test in 2008  This showed an EF of 61%, no diagnostic perfusion abnormalities, although there was a small fixed distal anterior defect, which was thought to be breast attenuation.   Now referred back by Dr. Laury Axon for re-evaluation of palpitations and recurrent CP.   Has been having multiple somatic complaints (anxiety, neck/back pain, memory problems, fatigue, CP, palpitation, poor slep, etc). and seeing Dr. Laury Axon almost every month. Had echo in April 2011 normal LV function no wall motion abnormalities. Grade 1 diastolic dysfx. RV normal. LA size normal.  Has frequent palpitations. Probably every few days. Mostly come with stress. Says they can last for hours. No syncope. Occasionally lightheaded. Says she has pain in her chest all the time. Sometimes mild sometimes worse.    Dyspepsia History:      There is a prior history of GERD.  An H-2 blocker  medication is currently being taken.  A prior EGD has been done.     Current Medications (verified): 1)  Tramadol Hcl 50 Mg  Tabs (Tramadol Hcl) .... Take 1 To 2 Tablet By Mouth Every 6 Hours As Needed 2)  Ranitidine Hcl 150 Mg  Caps (Ranitidine Hcl) .Marland Kitchen.. 1 By Mouth Two Times A Day -- Out 3)  Paxil 20 Mg  Tabs (Paroxetine Hcl) .Marland Kitchen.. 1 By Mouth Once Daily 4)  Neurontin 300 Mg Caps (Gabapentin) .... Take One Tablet Three Times Daily. As Needed 5)  Hydroxychloroquine Sulfate 200 Mg Tabs (Hydroxychloroquine Sulfate) .... Take 1 Tab Once Daily 6)  Synthroid 75 Mcg Tabs (Levothyroxine Sodium) .... Take 1 Tab Once Daily. 7)  Prenatal Multivit-Iron  Tabs (Prenatal Vit-Fe Sulfate-Fa) .Marland Kitchen.. 1 By Mouth Once Daily 8)  Slow Fe 160 (50 Fe) Mg Cr-Tabs (Ferrous Sulfate Dried) .Marland Kitchen.. 1 By Mouth Once Daily 9)  Fish Oil   Oil (Fish Oil) .... 1200mg  Once Daily 10)  Acai Berry 500 Mg Caps (Acai) .... Once Daily 11)  Ginkgo Biloba   Extr (Ginkgo Biloba) .... Once Daily 12)  Calcium Carbonate-Vitamin D 600-400 Mg-Unit  Tabs (Calcium Carbonate-Vitamin D) .... Once Daily 13)  Vitamin D 400 Unit  Tabs (Cholecalciferol) .... Once Daily 14)  Flax   Oil (Flaxseed (Linseed)) .... Once Daily  Allergies (verified): 1)  ! Codeine  Past History:  Past Surgical History: Last updated: 07/13/2009 C spine surgery x 3 Lower Back  Right Leg Surgery Carpal  Tunnel Release Bilateral   Family History: Last updated: 07/13/2009 Family History of Colon Cancer:MGF  Family History of Breast Cancer:Mother  Family History of Heart Disease: Father   Social History: Last updated: 07/13/2009 Occupation: Engineer, materials Seperated 3 boys  Patient currently smokes.  Alcohol Use - yes: 2 weekly Daily Caffeine Use: 1 daily  Illicit Drug Use - no Patient does not get regular exercise.   Risk Factors: Exercise: no (05/04/2010)  Risk Factors: Smoking Status: quit > 6 months (05/04/2010)  Past Medical History: 1. Mixed  connective tissue disease     with Raynaud's 2. GERD 3. Anxiety 4. Degenerative disc disease 5. Chest pain      -Myoview 2008 negative 6. Palpatations 7. Dizziness 8. Hypothyroidism  Family History: Reviewed history from 07/13/2009 and no changes required. Family History of Colon Cancer:MGF  Family History of Breast Cancer:Mother  Family History of Heart Disease: Father   Social History: Reviewed history from 07/13/2009 and no changes required. 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  Alcohol Use - yes: 2 weekly Daily Caffeine Use: 1 daily  Illicit Drug Use - no Patient does not get regular exercise.   Review of Systems       As per HPI and past medical history; otherwise all systems negative.   Vital Signs:  Patient profile:   56 year old female Height:      64 inches Weight:      179 pounds BMI:     30.84 Pulse rate:   89 / minute BP sitting:   100 / 70  (right arm) Cuff size:   regular  Vitals Entered By: Hardin Negus, RMA (May 31, 2010 11:41 AM)  Physical Exam  General:  Gen: well appearing. no resp difficulty HEENT: normal Neck: supple. no JVD. Carotids 2+ bilat; no bruits. No lymphadenopathy or thryomegaly appreciated. Cor: PMI nondisplaced. Regular rate & rhythm. No rubs, gallops, murmur. Lungs: clear Abdomen: soft, nontender, nondistended. No hepatosplenomegaly. No bruits or masses. Good bowel sounds. Extremities: no cyanosis, clubbing, rash, edema Neuro: alert & orientedx3, cranial nerves grossly intact. moves all 4 extremities w/o difficulty. affect pleasant    Impression & Recommendations:  Problem # 1:  CHEST PAIN, ATYPICAL (ICD-786.59) I suspect this is non-cardiac but given risk factors will proceed with stress echo. Suspect she may have somatization disorder.   Problem # 2:  PALPITATIONS, RECURRENT (ICD-785.1) Likely due to anxiety. Will place event monitor to exclude atrial arrhythmias.  Other  Orders: EKG w/ Interpretation (93000) Event (Event) Stress Echo (Stress Echo)  Patient Instructions: 1)  Your physician has requested that you have a stress echocardiogram. For further information please visit [email or web address]  Please follow instruction sheet as given. 2)  Your physician has recommended that you wear an event monitor.  Event monitors are medical devices that record the heart's electrical activity. Doctors most often use these monitors to diagnose arrhythmias. Arrhythmias are problems with the speed or rhythm of the heartbeat. The monitor is a small, portable device. You can wear one while you do your normal daily activities. This is usually used to diagnose what is causing palpitations/syncope (passing out).

## 2010-11-27 NOTE — Assessment & Plan Note (Signed)
Summary: per dr request/cbs   Vital Signs:  Patient profile:   55 year old female Height:      64 inches Weight:      179 pounds BMI:     30.84 Temp:     97.8 degrees F oral Pulse rate:   80 / minute BP sitting:   118 / 90  (left arm)  Vitals Entered By: Jeremy Johann CMA (May 04, 2010 8:57 AM) CC: refills, discuss labs Comments REVIEWED MED LIST, PATIENT AGREED DOSE AND INSTRUCTION CORRECT    History of Present Illness: Pt here to review labs and get refills.   Pt is seeing Dr Ethelene Hal for pain and he has sent her to neuro for further eval.   Pt is taking MVI with iron.   Pt only other c/o is memory problems and fatigue-- pt believes this may be all hormonal.    Preventive Screening-Counseling & Management  Alcohol-Tobacco     Smoking Status: quit > 6 months     Smoking Cessation Counseling: yes     Smoke Cessation Stage: quit     Year Quit: 10/2009  Caffeine-Diet-Exercise     Does Patient Exercise: no  Current Medications (verified): 1)  Tramadol Hcl 50 Mg  Tabs (Tramadol Hcl) .... Take 1 To 2 Tablet By Mouth Every 6 Hours As Needed 2)  Ranitidine Hcl 150 Mg  Caps (Ranitidine Hcl) .Marland Kitchen.. 1 By Mouth Two Times A Day 3)  Paxil 20 Mg  Tabs (Paroxetine Hcl) .Marland Kitchen.. 1 By Mouth Once Daily 4)  Neurontin 300 Mg Caps (Gabapentin) .... Take One Tablet Three Times Daily. 5)  Hydroxychloroquine Sulfate 200 Mg Tabs (Hydroxychloroquine Sulfate) .... Take 1 Tab Once Daily 6)  Synthroid 75 Mcg Tabs (Levothyroxine Sodium) .... Take 1 Tab Once Daily. Needs Labwork Before Additional Refills. 7)  Prenatal Multivit-Iron  Tabs (Prenatal Vit-Fe Sulfate-Fa) .Marland Kitchen.. 1 By Mouth Once Daily 8)  Slow Fe 160 (50 Fe) Mg Cr-Tabs (Ferrous Sulfate Dried) .Marland Kitchen.. 1 By Mouth Once Daily  Allergies: 1)  ! Codeine  Past History:  Past medical, surgical, family and social histories (including risk factors) reviewed for relevance to current acute and chronic problems.  Past Medical History: Reviewed history from  07/13/2009 and no changes required. mixed connective tissue disease GERD Anxiety Degenerative disc disease  Past Surgical History: Reviewed history from 07/13/2009 and no changes required. C spine surgery x 3 Lower Back  Right Leg Surgery Carpal Tunnel Release Bilateral   Family History: Reviewed history from 07/13/2009 and no changes required. Family History of Colon Cancer:MGF  Family History of Breast Cancer:Mother  Family History of Heart Disease: Father   Social History: Reviewed history from 07/13/2009 and no changes required. 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  Alcohol Use - yes: 2 weekly Daily Caffeine Use: 1 daily  Illicit Drug Use - no Patient does not get regular exercise.  Smoking Status:  quit > 6 months  Review of Systems      See HPI  Physical Exam  General:  Well-developed,well-nourished,in no acute distress; alert,appropriate and cooperative throughout examination Lungs:  Normal respiratory effort, chest expands symmetrically. Lungs are clear to auscultation, no crackles or wheezes. Heart:  normal rate and no murmur.   Extremities:  No clubbing, cyanosis, edema, or deformity noted with normal full range of motion of all joints.   Psych:  Oriented X3, normally interactive, good eye contact, not anxious appearing, and not depressed appearing.     Impression &  Recommendations:  Problem # 1:  HYPERGLYCEMIA, FASTING (ICD-790.29) watch simple sugars and starches Orders: Venipuncture (16109) TLB-BMP (Basic Metabolic Panel-BMET) (80048-METABOL) TLB-A1C / Hgb A1C (Glycohemoglobin) (83036-A1C)  Labs Reviewed: Creat: 0.7 (04/20/2010)     Problem # 2:  FATIGUE (ICD-780.79)  Problem # 3:  DIASTOLIC DYSFUNCTION (ICD-429.9) Assessment: Unchanged per cardio  Problem # 4:  LEG PAIN, BILATERAL (ICD-729.5)  Problem # 5:  BACK PAIN WITH RADICULOPATHY (ICD-729.2) pt seeing ortho and Neuro  Problem # 6:  PALPITATIONS,  RECURRENT (ICD-785.1) Assessment: Unchanged cardio pending--- pt had to reschedule appointment encouraged pt to keep cardio appointment  Problem # 7:  ANXIETY (ICD-300.00)  The following medications were removed from the medication list:    Klonopin 0.5 Mg Tabs (Clonazepam) .Marland Kitchen... 1 by mouth two times a day Her updated medication list for this problem includes:    Paxil 20 Mg Tabs (Paroxetine hcl) .Marland Kitchen... 1 by mouth once daily  Discussed medication use and relaxation techniques.   Problem # 8:  MIXED CONNECTIVE TISSUE DISEASE (ICD-710.9)  Problem # 9:  UNSPECIFIED ANEMIA (ICD-285.9)  Her updated medication list for this problem includes:    Slow Fe 160 (50 Fe) Mg Cr-tabs (Ferrous sulfate dried) .Marland Kitchen... 1 by mouth once daily  Hgb: 11.9 (04/20/2010)   Hct: 35.2 (04/20/2010)   Platelets: 239.0 (04/20/2010) RBC: 4.16 (04/20/2010)   RDW: 14.2 (04/20/2010)   WBC: 5.3 (04/20/2010) MCV: 84.6 (04/20/2010)   MCHC: 33.7 (04/20/2010) TSH: 2.34 (04/20/2010)  Problem # 10:  MEMORY LOSS (ICD-780.93) may be hormonal related-- she should d/w GYN pt has appointment with Neuro  Problem # 11:  HYPERGLYCEMIA, FASTING (ICD-790.29)  Orders: Venipuncture (60454) TLB-BMP (Basic Metabolic Panel-BMET) (80048-METABOL) TLB-A1C / Hgb A1C (Glycohemoglobin) (83036-A1C)  Complete Medication List: 1)  Tramadol Hcl 50 Mg Tabs (Tramadol hcl) .... Take 1 to 2 tablet by mouth every 6 hours as needed 2)  Ranitidine Hcl 150 Mg Caps (Ranitidine hcl) .Marland Kitchen.. 1 by mouth two times a day 3)  Paxil 20 Mg Tabs (Paroxetine hcl) .Marland Kitchen.. 1 by mouth once daily 4)  Neurontin 300 Mg Caps (Gabapentin) .... Take one tablet three times daily. 5)  Hydroxychloroquine Sulfate 200 Mg Tabs (Hydroxychloroquine sulfate) .... Take 1 tab once daily 6)  Synthroid 75 Mcg Tabs (Levothyroxine sodium) .... Take 1 tab once daily. needs labwork before additional refills. 7)  Prenatal Multivit-iron Tabs (Prenatal vit-fe sulfate-fa) .Marland Kitchen.. 1 by mouth once  daily 8)  Slow Fe 160 (50 Fe) Mg Cr-tabs (Ferrous sulfate dried) .Marland Kitchen.. 1 by mouth once daily  Patient Instructions: 1)  Please schedule a follow-up appointment in 6 months .

## 2010-11-27 NOTE — Assessment & Plan Note (Signed)
Summary: FOLLOW UP AND FLU SHOT/KB   Vital Signs:  Patient profile:   55 year old female Weight:      175.6 pounds Temp:     97.3 degrees F oral Pulse rate:   108 / minute Pulse rhythm:   regular BP sitting:   150 / 94  (left arm) Cuff size:   regular  Vitals Entered By: Almeta Monas CMA Duncan Dull) (August 31, 2010 3:57 PM) CC: c/o headache, chest pain, rapid heartbeat and increased stress---pt discontinued some of her meds Flu Vaccine Consent Questions     Do you have a history of severe allergic reactions to this vaccine? no    Any prior history of allergic reactions to egg and/or gelatin? no    Do you have a sensitivity to the preservative Thimersol? no    Do you have a past history of Guillan-Barre Syndrome? no    Do you currently have an acute febrile illness? no    Have you ever had a severe reaction to latex? no    Vaccine information given and explained to patient? yes    Are you currently pregnant? no    Lot Number:[id number]   Exp Date:04/27/2011   Site Given  Left Deltoid IM   History of Present Illness: Pt here c/o increased stress at home and chest pains---she needs her ranitidine.  She has been having palpatations too.  Pt is in school and has a very heavy schedule and her family is stressing her out.    Current Medications (verified): 1)  Tramadol Hcl 50 Mg  Tabs (Tramadol Hcl) .... Take 1 To 2 Tablet By Mouth Every 6 Hours As Needed 2)  Ranitidine Hcl 150 Mg  Caps (Ranitidine Hcl) .Marland Kitchen.. 1 By Mouth Two Times A Day 3)  Paxil 20 Mg  Tabs (Paroxetine Hcl) .Marland Kitchen.. 1 By Mouth Once Daily 4)  Neurontin 300 Mg Caps (Gabapentin) .... Take One Tablet Three Times Daily. As Needed 5)  Synthroid 75 Mcg Tabs (Levothyroxine Sodium) .... Take 1 Tab Once Daily. 6)  Prenatal Multivit-Iron  Tabs (Prenatal Vit-Fe Sulfate-Fa) .Marland Kitchen.. 1 By Mouth Once Daily 7)  Slow Fe 160 (50 Fe) Mg Cr-Tabs (Ferrous Sulfate Dried) .Marland Kitchen.. 1 By Mouth Once Daily 8)  Fish Oil   Oil (Fish Oil) .... 1200mg  Once  Daily 9)  Acai Berry 500 Mg Caps (Acai) .... Once Daily 10)  Ginkgo Biloba   Extr (Ginkgo Biloba) .... Once Daily 11)  Calcium Carbonate-Vitamin D 600-400 Mg-Unit  Tabs (Calcium Carbonate-Vitamin D) .... Once Daily 12)  Vitamin D 400 Unit  Tabs (Cholecalciferol) .... Once Daily 13)  Flax   Oil (Flaxseed (Linseed)) .... Once Daily 14)  Toprol Xl 50 Mg Xr24h-Tab (Metoprolol Succinate) .Marland Kitchen.. 1 By Mouth Two Times A Day 15)  Ativan 0.5 Mg Tabs (Lorazepam) .Marland Kitchen.. 1 By Mouth Three Times A Day As Needed 16)  Chantix Starting Month Pak 0.5 Mg X 11 & 1 Mg X 42 Tabs (Varenicline Tartrate) .... As Directed  Allergies (verified): 1)  ! Codeine  Past History:  Past Medical History: Last updated: 05/31/2010 1. Mixed connective tissue disease     with Raynaud's 2. GERD 3. Anxiety 4. Degenerative disc disease 5. Chest pain      -Myoview 2008 negative 6. Palpatations 7. Dizziness 8. 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  Alcohol Use - yes: 2 weekly Daily Caffeine Use: 1 daily  Illicit Drug Use - no Patient does not get regular exercise.   Risk Factors: Exercise: no (05/04/2010)  Risk Factors: Smoking Status: quit > 6 months (05/04/2010)  Family History: Reviewed history from 07/13/2009 and no changes required. Family History of Colon Cancer:MGF  Family History of Breast Cancer:Mother  Family History of Heart Disease: Father   Social History: Reviewed history from 07/13/2009 and no changes required. 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  Alcohol Use - yes: 2  weekly Daily Caffeine Use: 1 daily  Illicit Drug Use - no Patient does not get regular exercise.   Review of Systems      See HPI  Physical Exam  General:  Well-developed,well-nourished,in no acute distress; alert,appropriate and cooperative throughout examination Neck:  No deformities, masses, or tenderness noted. Lungs:  Normal respiratory effort, chest expands symmetrically. Lungs are clear to auscultation, no crackles or wheezes. Heart:  normal rate and no murmur.   Extremities:  No clubbing, cyanosis, edema, or deformity noted with normal full range of motion of all joints.   Neurologic:  No cranial nerve deficits noted. Station and gait are normal. Plantar reflexes are down-going bilaterally. DTRs are symmetrical throughout. Sensory, motor and coordinative functions appear intact. Psych:  Oriented X3 and severely anxious.     Impression & Recommendations:  Problem # 1:  CHEST PAIN, PRECORDIAL (ICD-786.51)  Go to ER if symptoms return  Reviewed EKG (see interpretation) and treatment options. Patient instructed to call for worsening pain, or new symptoms.   Orders: EKG w/ Interpretation (93000)  Problem # 2:  TOBACCO USER (ICD-305.1)  Her updated medication list for this problem includes:    Chantix Starting Month Pak 0.5 Mg X 11 & 1 Mg X 42 Tabs (Varenicline tartrate) .Marland Kitchen... As directed  Encouraged smoking cessation and discussed different methods for smoking cessation.   Problem # 3:  ANXIETY (ICD-300.00)  Her updated medication list for this problem includes:    Paxil 20 Mg Tabs (Paroxetine hcl) .Marland Kitchen... 1 by mouth once daily    Ativan 0.5 Mg Tabs (Lorazepam) .Marland Kitchen... 1 by mouth three times a day as needed  Orders: EKG w/ Interpretation (93000)  Discussed medication use and relaxation techniques.   Problem # 4:  PALPITATIONS, RECURRENT (ICD-785.1)  Her updated medication list for this problem includes:    Toprol Xl 50 Mg Xr24h-tab (Metoprolol succinate) .Marland Kitchen... 1 by  mouth two times a day  Avoid caffeine, chocolate, and stimulants. Stress reduction as well as medication options discussed.   Orders: EKG w/ Interpretation (93000)  Complete Medication List: 1)  Tramadol Hcl 50 Mg Tabs (Tramadol hcl) .... Take 1 to 2 tablet by mouth every 6 hours as needed 2)  Ranitidine Hcl 150 Mg Caps (Ranitidine hcl) .Marland Kitchen.. 1 by mouth two times a day 3)  Paxil 20 Mg Tabs (Paroxetine hcl) .Marland Kitchen.. 1 by mouth once daily 4)  Neurontin 300 Mg Caps (Gabapentin) .... Take one tablet three times daily. as needed 5)  Synthroid 75 Mcg Tabs (Levothyroxine sodium) .... Take 1 tab once daily. 6)  Prenatal Multivit-iron Tabs (Prenatal vit-fe sulfate-fa) .Marland Kitchen.. 1 by mouth once daily 7)  Slow Fe 160 (50 Fe) Mg Cr-tabs (Ferrous sulfate dried) .Marland Kitchen.. 1 by mouth once daily  8)  Fish Oil Oil (Fish oil) .... 1200mg  once daily 9)  Acai Berry 500 Mg Caps (Acai) .... Once daily 10)  Ginkgo Biloba Extr (Ginkgo biloba) .... Once daily 11)  Calcium Carbonate-vitamin D 600-400 Mg-unit Tabs (Calcium carbonate-vitamin d) .... Once daily 12)  Vitamin D 400 Unit Tabs (Cholecalciferol) .... Once daily 13)  Flax Oil (Flaxseed (linseed)) .... Once daily 14)  Toprol Xl 50 Mg Xr24h-tab (Metoprolol succinate) .Marland Kitchen.. 1 by mouth two times a day 15)  Ativan 0.5 Mg Tabs (Lorazepam) .Marland Kitchen.. 1 by mouth three times a day as needed 16)  Chantix Starting Month Pak 0.5 Mg X 11 & 1 Mg X 42 Tabs (Varenicline tartrate) .... As directed  Other Orders: Admin 1st Vaccine (59563) Flu Vaccine 74yrs + (87564)  Patient Instructions: 1)  Please schedule a follow-up appointment in 1 month.  Prescriptions: CHANTIX STARTING MONTH PAK 0.5 MG X 11 & 1 MG X 42 TABS (VARENICLINE TARTRATE) as directed  #1 x 0   Entered and Authorized by:   Loreen Freud DO   Signed by:   Loreen Freud DO on 08/31/2010   Method used:   Print then Give to Patient   RxID:   [id number] RANITIDINE HCL 150 MG  CAPS (RANITIDINE HCL) 1 by mouth two times a day   #60 x 5   Entered and Authorized by:   Loreen Freud DO   Signed by:   Loreen Freud DO on 08/31/2010   Method used:   Electronically to        [facility] [address] (retail)       [phone]       [place], [place]  [address]       Ph: [phone]       Fax: [phone]   RxID:   [id number] ATIVAN 0.5 MG TABS (LORAZEPAM) 1 by mouth three times a day as needed  #60 x 0   Entered and Authorized by:   Loreen Freud DO   Signed by:   Loreen Freud DO on 08/31/2010   Method used:   Print then Give to Patient   RxID:   [id number] TOPROL XL 50 MG XR24H-TAB (METOPROLOL SUCCINATE) 1 by mouth two times a day  #30 x 2   Entered and Authorized by:   Loreen Freud DO   Signed by:   Loreen Freud DO on 08/31/2010   Method used:   Electronically to        [facility] [address] (retail)       [id number]       [place], [place]  [address]       Ph: [phone]       Fax: [phone]   RxID:   [id number]    Orders Added: 1)  Admin 1st Vaccine [90471] 2)  Flu Vaccine 68yrs + [40086] 3)  Est. Patient Level IV [76195] 4)  EKG w/ Interpretation [93000]

## 2010-11-27 NOTE — Progress Notes (Signed)
Summary: renew letter  Phone Note Call from Patient Call back at Home Phone [phone]   Caller: Patient Summary of Call: pt states that her son needs another letter (see letter (06-02-09) for her son school stating that he has to help her sometime due to her condition. dr Laury Axon pls advise if ok to renew letter...............Marland KitchenFelecia Deloach CMA  November 24, 2009 3:38 PM   Follow-up for Phone Call        ok to reprint Follow-up by: Loreen Freud DO,  November 24, 2009 5:15 PM  Additional Follow-up for Phone Call Additional follow up Details #1::        left pt detail message letter ready for pick-up...............Marland KitchenFelecia Deloach CMA  November 27, 2009 8:19 AM

## 2010-11-27 NOTE — Letter (Signed)
Summary: Primary Care Consult Scheduled Letter  [facility]  [address] [place], [place] [address]   Phone: [phone]  Fax: [phone]      02/22/2010 MRN: [id number]  Debra [name] [address] [place], [place]  [address]    Dear Ms. Sweetland,    We have scheduled an appointment for you.  At the recommendation of Dr. Loreen Freud, we have scheduled you a consult with Dr. Arvilla Meres of [facility] on 03-20-2010 at 11:00am.  Their address is [address]rd floor, [place] [address]. The office phone number is [phone].  If this appointment day and time is not convenient for you, please feel free to call the office of the doctor you are being referred to at the number listed above and reschedule the appointment.    It is important for you to keep your scheduled appointments. We are here to make sure you are given good patient care.   Thank you,    Renee, Patient Care Coordinator [facility]

## 2010-11-27 NOTE — Progress Notes (Signed)
Summary: Refill Request  Phone Note Refill Request Message from:  Patient on April 19, 2010 2:23 PM  Refills Requested: Medication #1:  SYNTHROID 75 MCG TABS take 1 tab once daily. NEEDS LABWORK BEFORE ADDITIONAL REFILLS.   Dosage confirmed as above?Dosage Confirmed   Supply Requested: 1 month [facility] on [address]   Next Appointment Scheduled: 6.24.11 (labwork) Initial call taken by: Harold Barban,  April 19, 2010 2:23 PM    New/Updated Medications: SYNTHROID 75 MCG TABS (LEVOTHYROXINE SODIUM) take 1 tab once daily. NEEDS LABWORK BEFORE ADDITIONAL REFILLS. Prescriptions: SYNTHROID 75 MCG TABS (LEVOTHYROXINE SODIUM) take 1 tab once daily. NEEDS LABWORK BEFORE ADDITIONAL REFILLS.  #30 x 0   Entered by:   Army Fossa CMA   Authorized by:   Loreen Freud DO   Signed by:   Army Fossa CMA on 04/19/2010   Method used:   Electronically to        [facility] [phone]* (retail)       [address]       [place], [place]  [address]       Ph: [phone]       Fax: [phone]   RxID:   [id number]

## 2010-11-27 NOTE — Assessment & Plan Note (Signed)
Summary: fatigue, sleepiness, ? reaction to med//fd   Vital Signs:  Patient profile:   55 year old female Weight:      179 pounds O2 Sat:      97 % on Room air Pulse rate:   86 / minute Pulse rhythm:   regular BP sitting:   122 / 80  (left arm) Cuff size:   regular  Vitals Entered By: Army Fossa CMA (January 25, 2010 1:59 PM)  O2 Flow:  Room air CC: Pt here to discuss possible reaction to chantix, has a nagging pain in her chest.    History of Present Illness:       [name] who presents with Chest Pain.  The symptoms began 4 weeks ago.  The patient reports resting chest pain and palpitations, but denies exertional chest pain, nausea, vomiting, diaphoresis, shortness of breath, dizziness, light headedness, syncope, and indigestion.  The pain is described as constant and sharp.  The pain is located in the left anterior chest and the pain does not radiate.  The pain is brought on or made worse by lying down.  The pain is relieved or improved with eating.  Pt also c/o pain in legs  and R ear.  Pt is also so fatigued.     Preventive Screening-Counseling & Management  Alcohol-Tobacco     Smoking Status: current  Caffeine-Diet-Exercise     Does Patient Exercise: no  Current Medications (verified): 1)  Tramadol Hcl 50 Mg  Tabs (Tramadol Hcl) .... Take 1 To 2 Tablet By Mouth Every 6 Hours As Needed 2)  Ranitidine Hcl 150 Mg  Caps (Ranitidine Hcl) .Marland Kitchen.. 1 By Mouth Two Times A Day 3)  Paxil 20 Mg  Tabs (Paroxetine Hcl) .Marland Kitchen.. 1 By Mouth Once Daily 4)  Neurontin 300 Mg Caps (Gabapentin) .... Take One Tablet Three Times Daily. 5)  Hydroxychloroquine Sulfate 200 Mg Tabs (Hydroxychloroquine Sulfate) .... Take 1 Tab Once Daily 6)  Synthroid 75 Mcg Tabs (Levothyroxine Sodium) .... Take 1 Tab Once Daily. Needs Labwork Before Additional Refills. 7)  Chantix Starting Month Pak 0.5 Mg X 11 & 1 Mg X 42 Tabs (Varenicline Tartrate) .... As Directed 8)  Klonopin 0.5 Mg Tabs  (Clonazepam) .Marland Kitchen.. 1 By Mouth Two Times A Day  Allergies: 1)  ! Codeine  Past History:  Past medical, surgical, family and social histories (including risk factors) reviewed for relevance to current acute and chronic problems.  Past Medical History: Reviewed history from 07/13/2009 and no changes required. mixed connective tissue disease GERD Anxiety Degenerative disc disease  Past Surgical History: Reviewed history from 07/13/2009 and no changes required. C spine surgery x 3 Lower Back  Right Leg Surgery Carpal Tunnel Release Bilateral   Family History: Reviewed history from 07/13/2009 and no changes required. Family History of Colon Cancer:MGF  Family History of Breast Cancer:Mother  Family History of Heart Disease: Father   Social History: Reviewed history from 07/13/2009 and no changes required. 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  Alcohol Use - yes: 2 weekly Daily Caffeine Use: 1 daily  Illicit Drug Use - no Patient does not get regular exercise.   Review of Systems      See HPI  Physical Exam  General:  Well-developed,well-nourished,in no acute distress; alert,appropriate and cooperative throughout examination Chest Wall:  + tenderness L chest  Lungs:  Normal respiratory effort, chest expands symmetrically. Lungs are clear to auscultation, no crackles or wheezes. Heart:  normal rate and no murmur.   Msk:  normal ROM, no joint swelling, no joint warmth, no redness over joints, and no joint deformities.   Extremities:  No clubbing, cyanosis, edema, or deformity noted with normal full range of motion of all joints.   Neurologic:  strength normal in all extremities, gait normal, and DTRs symmetrical and normal.   Skin:  Intact without suspicious lesions or rashes Cervical Nodes:  No lymphadenopathy noted Psych:  Oriented X3, normally interactive, and moderately anxious.     Impression & Recommendations:  Problem # 1:   FATIGUE (ICD-780.79)  Orders: EKG w/ Interpretation (93000)  Problem # 2:  CHEST PAIN, ATYPICAL (ICD-786.59)  Problem # 3:  ANXIETY (ICD-300.00)  rto 4-6 weeks or sooner as needed  Her updated medication list for this problem includes:    Paxil 20 Mg Tabs (Paroxetine hcl) .Marland Kitchen... 1 by mouth once daily    Klonopin 0.5 Mg Tabs (Clonazepam) .Marland Kitchen... 1 by mouth two times a day  Orders: EKG w/ Interpretation (93[address])  Problem # 4:  PALPITATIONS, RECURRENT (ICD-785.1) pt seen by cardio=== echo not done they felt it was anxiety Orders: Echo Referral (Echo) EKG w/ Interpretation (93[address])  Problem # 5:  BACK PAIN WITH RADICULOPATHY (ICD-729.2)  Orders: Orthopedic Surgeon Referral (Ortho Surgeon)  Complete Medication List: 1)  Tramadol Hcl 50 Mg Tabs (Tramadol hcl) .... Take 1 to 2 tablet by mouth every 6 hours as needed 2)  Ranitidine Hcl 150 Mg Caps (Ranitidine hcl) .Marland Kitchen.. 1 by mouth two times a day 3)  Paxil 20 Mg Tabs (Paroxetine hcl) .Marland Kitchen.. 1 by mouth once daily 4)  Neurontin 300 Mg Caps (Gabapentin) .... Take one tablet three times daily. 5)  Hydroxychloroquine Sulfate 200 Mg Tabs (Hydroxychloroquine sulfate) .... Take 1 tab once daily 6)  Synthroid 75 Mcg Tabs (Levothyroxine sodium) .... Take 1 tab once daily. needs labwork before additional refills. 7)  Chantix Starting Month Pak 0.5 Mg X 11 & 1 Mg X 42 Tabs (Varenicline tartrate) .... As directed 8)  Klonopin 0.5 Mg Tabs (Clonazepam) .Marland Kitchen.. 1 by mouth two times a day  Other Orders: Venipuncture ([address]) TLB-Cardiac Panel ([id number])  Patient Instructions: 1)  IF SYMPTOMS WORSEN--GO TO ER 2)  RTO 4-6 WEEKS Prescriptions: KLONOPIN 0.5 MG TABS (CLONAZEPAM) 1 by mouth two times a day  #60 x 0   Entered and Authorized by:   Loreen Freud DO   Signed by:   Loreen Freud DO on 01/25/2010   Method used:   Print then Give to Patient   RxID:   [id number]    EKG  Procedure date:  01/25/2010  Findings:      Normal sinus  rhythm with rate of:  73

## 2010-11-27 NOTE — Consult Note (Signed)
Summary: [facility]   Imported By: Lanelle Bal 02/28/2010 08:37:43  _____________________________________________________________________  External Attachment:    Type:   Image     Comment:   External Document

## 2010-11-27 NOTE — Consult Note (Signed)
Summary: [facility]  [facility]   Imported By: Lanelle Bal 05/11/2010 11:17:26  _____________________________________________________________________  External Attachment:    Type:   Image     Comment:   External Document

## 2010-11-27 NOTE — Letter (Signed)
Summary: Work Dietitian at [employer]  [address] [place], [place] [address]   Phone: [phone]  Fax: [phone]    Today's Date: November 27, 2009  Name of Patient: Debra [name]  .  Please take this into consideration when reviewing the time away from work/school.    Special Instructions:  [  ] None  [  ] To be off the remainder of today, returning to the normal work / school schedule tomorrow.  [  ] To be off until the next scheduled appointment on ______________________.  [X]  Other ________The patient has mixed connective disorder that causes a lot of pain.  She frequently needs help at home.  ________________________________________________________________ ________________________________________________________________________   Sincerely yours,        Loreen Freud, DO

## 2010-11-27 NOTE — Progress Notes (Signed)
Summary: REFILL  Phone Note Refill Request Message from:  Fax from Pharmacy on [facility] [address] FAX [phone]  Refills Requested: Medication #1:  RANITIDINE HCL 150 MG  CAPS 1 by mouth two times a day last filled 10/31/08, last ov-05/2009. okay to fill?   Initial call taken by: Barb Merino,  October 30, 2009 10:18 AM  Follow-up for Phone Call        ok to refill x1  5 refills Follow-up by: Loreen Freud DO,  October 30, 2009 12:03 PM    Prescriptions: RANITIDINE HCL 150 MG  CAPS (RANITIDINE HCL) 1 by mouth two times a day  #60 Each x 5   Entered by:   Army Fossa CMA   Authorized by:   Loreen Freud DO   Signed by:   Army Fossa CMA on 10/30/2009   Method used:   Electronically to        [facility] [address] (retail)       [phone]       [place], [place]  [address]       Ph: [phone]       Fax: [phone]   RxID:   [id number]

## 2010-11-27 NOTE — Progress Notes (Signed)
Summary: Fasting OV - 811914  Phone Note Outgoing Call   Call placed by: Army Fossa CMA,  April 16, 2010 8:05 AM Summary of Call: Pt needs to make an appt for Fasting OV. Army Fossa CMA  April 16, 2010 8:05 AM   Follow-up for Phone Call        appt scheduled [phone] .Marland KitchenOkey Regal [place]  April 16, 2010 3:47 PM

## 2010-11-27 NOTE — Progress Notes (Signed)
Summary: referral  Phone Note Call from Patient   Caller: Patient Summary of Call: Pt left VM that she would like for dr lowne to give her a referral to dr Nolene Bernheim. pt states that she has a appt with him today @2pm  and is unsure if insurance will cover it without referral. dr Laury Axon are you ok with refering  pt..............Marland KitchenFelecia Deloach CMA  November 02, 2009 3:20 PM   left message to call office to see if pt still needs referral.................Marland KitchenFelecia Deloach CMA  November 02, 2009 3:20 PM    Follow-up for Phone Call        yes Follow-up by: Loreen Freud DO,  November 02, 2009 3:25 PM  Additional Follow-up for Phone Call Additional follow up Details #1::        Pt called back and stated she already had appt but thank you for calling her back. Army Fossa CMA  November 03, 2009 12:04 PM

## 2010-11-27 NOTE — Assessment & Plan Note (Signed)
Summary: rto 1 month/cbs-RESCD CBS   Vital Signs:  Patient profile:   55 year old female Weight:      176.6 pounds Pulse rate:   72 / minute Pulse rhythm:   regular BP sitting:   140 / 90  (right arm) Cuff size:   regular  Vitals Entered By: Almeta Monas CMA Duncan Dull) (October 04, 2010 2:33 PM) CC: 1 mo f/u---needs refill on Tramadol   History of Present Illness: Pt here f/u anxiety and bp.  Pt is feeling better but admits to no being consistent with BP meds.    Preventive Screening-Counseling & Management  Alcohol-Tobacco     Smoking Status: quit > 6 months     Smoking Cessation Counseling: yes     Smoke Cessation Stage: quit     Year Quit: 10/2009  Caffeine-Diet-Exercise     Does Patient Exercise: no  Current Medications (verified): 1)  Tramadol Hcl 50 Mg  Tabs (Tramadol Hcl) .... Take 1 To 2 Tablet By Mouth Every 6 Hours As Needed 2)  Ranitidine Hcl 150 Mg  Caps (Ranitidine Hcl) .Marland Kitchen.. 1 By Mouth Two Times A Day 3)  Paxil 20 Mg  Tabs (Paroxetine Hcl) .Marland Kitchen.. 1 By Mouth Once Daily 4)  Neurontin 300 Mg Caps (Gabapentin) .... Take One Tablet Three Times Daily. As Needed 5)  Synthroid 75 Mcg Tabs (Levothyroxine Sodium) .... Take 1 Tab Once Daily. 6)  Prenatal Multivit-Iron  Tabs (Prenatal Vit-Fe Sulfate-Fa) .Marland Kitchen.. 1 By Mouth Once Daily 7)  Slow Fe 160 (50 Fe) Mg Cr-Tabs (Ferrous Sulfate Dried) .Marland Kitchen.. 1 By Mouth Once Daily 8)  Fish Oil   Oil (Fish Oil) .... 1200mg  Once Daily 9)  Acai Berry 500 Mg Caps (Acai) .... Once Daily 10)  Ginkgo Biloba   Extr (Ginkgo Biloba) .... Once Daily 11)  Calcium Carbonate-Vitamin D 600-400 Mg-Unit  Tabs (Calcium Carbonate-Vitamin D) .... Once Daily 12)  Vitamin D 400 Unit  Tabs (Cholecalciferol) .... Once Daily 13)  Flax   Oil (Flaxseed (Linseed)) .... Once Daily 14)  Toprol Xl 50 Mg Xr24h-Tab (Metoprolol Succinate) .Marland Kitchen.. 1 By Mouth Two Times A Day 15)  Ativan 0.5 Mg Tabs (Lorazepam) .Marland Kitchen.. 1 By Mouth Three Times A Day As Needed 16)  Chantix Starting  Month Pak 0.5 Mg X 11 & 1 Mg X 42 Tabs (Varenicline Tartrate) .... As Directed  Allergies (verified): 1)  ! Codeine  Past History:  Past Medical History: Last updated: 05/31/2010 1. Mixed connective tissue disease     with Raynaud's 2. GERD 3. Anxiety 4. Degenerative disc disease 5. Chest pain      -Myoview 2008 negative 6. Palpatations 7. Dizziness 8. 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  Alcohol Use - yes: 2 weekly Daily Caffeine Use: 1 daily  Illicit Drug Use - no Patient does not get regular exercise.   Risk Factors: Exercise: no (10/04/2010)  Risk Factors: Smoking Status: quit > 6 months (10/04/2010)  Family History: Reviewed history from 07/13/2009 and no changes required. Family History of Colon Cancer:MGF  Family History of Breast Cancer:Mother  Family History of Heart Disease: Father   Social History: Reviewed history from 07/13/2009  and no changes required. 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  Alcohol Use - yes: 2 weekly Daily Caffeine Use: 1 daily  Illicit Drug Use - no Patient does not get regular exercise.   Review of Systems      See HPI  Physical Exam  General:  Well-developed,well-nourished,in no acute distress; alert,appropriate and cooperative throughout examination Lungs:  Normal respiratory effort, chest expands symmetrically. Lungs are clear to auscultation, no crackles or wheezes. Heart:  normal rate and no murmur.   Extremities:  No clubbing, cyanosis, edema, or deformity noted with normal full range of  motion of all joints.   Psych:  Oriented X3, normally interactive, good eye contact, and slightly anxious.     Impression & Recommendations:  Problem # 1:  ESSENTIAL HYPERTENSION (ICD-401.9)  Her updated medication list for this problem includes:    Toprol Xl 50 Mg Xr24h-tab (Metoprolol succinate) .Marland Kitchen... 1 by mouth two times a day  BP today: 140/90 Prior BP: 150/94 (08/31/2010)  Labs Reviewed: K+: 4.7 (05/04/2010) Creat: : 0.6 (05/04/2010)   Chol: 201 (04/20/2010)   HDL: 73.00 (04/20/2010)   TG: 81.0 (04/20/2010)  Problem # 2:  ANXIETY (ICD-300.00)  Her updated medication list for this problem includes:    Paxil 20 Mg Tabs (Paroxetine hcl) .Marland Kitchen... 1 by mouth once daily    Ativan 0.5 Mg Tabs (Lorazepam) .Marland Kitchen... 1 by mouth three times a day as needed  Discussed medication use and relaxation techniques.   Complete Medication List: 1)  Tramadol Hcl 50 Mg Tabs (Tramadol hcl) .... Take 1 to 2 tablet by mouth every 6 hours as needed 2)  Ranitidine Hcl 150 Mg Caps (Ranitidine hcl) .Marland Kitchen.. 1 by mouth two times a day 3)  Paxil 20 Mg Tabs (Paroxetine hcl) .Marland Kitchen.. 1 by mouth once daily 4)  Neurontin 300 Mg Caps (Gabapentin) .... Take one tablet three times daily. as needed 5)  Synthroid 75 Mcg Tabs (Levothyroxine sodium) .... Take 1 tab once daily. 6)  Prenatal Multivit-iron Tabs (Prenatal vit-fe sulfate-fa) .Marland Kitchen.. 1 by mouth once daily 7)  Slow Fe 160 (50 Fe) Mg Cr-tabs (Ferrous sulfate dried) .Marland Kitchen.. 1 by mouth once daily 8)  Fish Oil Oil (Fish oil) .... 1200mg  once daily 9)  Acai Berry 500 Mg Caps (Acai) .... Once daily 10)  Ginkgo Biloba Extr (Ginkgo biloba) .... Once daily 11)  Calcium Carbonate-vitamin D 600-400 Mg-unit Tabs (Calcium carbonate-vitamin d) .... Once daily 12)  Vitamin D 400 Unit Tabs (Cholecalciferol) .... Once daily 13)  Flax Oil (Flaxseed (linseed)) .... Once daily 14)  Toprol Xl 50 Mg Xr24h-tab (Metoprolol succinate) .Marland Kitchen.. 1 by mouth two times a day 15)  Ativan 0.5 Mg Tabs  (Lorazepam) .Marland Kitchen.. 1 by mouth three times a day as needed 16)  Chantix Starting Month Pak 0.5 Mg X 11 & 1 Mg X 42 Tabs (Varenicline tartrate) .... As directed  Patient Instructions: 1)  Please schedule a follow-up appointment in 3 months .  Prescriptions: TRAMADOL HCL 50 MG  TABS (TRAMADOL HCL) TAKE 1 TO 2 TABLET BY MOUTH EVERY 6 HOURS AS NEEDED  #60 Each x 1   Entered and Authorized by:   Loreen Freud DO   Signed by:   Loreen Freud DO on 10/04/2010   Method used:   Electronically to        [facility] [address] (retail)       [phone]       [facility]  [place], [place]  [address]       Ph: [phone]       Fax: [phone]   RxID:   [id number]    Orders Added: 1)  Est. Patient Level III [[address]]

## 2010-11-27 NOTE — Progress Notes (Signed)
Summary: test results   Phone Note Outgoing Call   Call placed by: Meredith Staggers, RN,  July 10, 2010 4:37 PM Summary of Call: called pt w/test results, monitor showed NSR w/no arrhymias per Dr Gala Romney, pt is aware

## 2010-11-27 NOTE — Progress Notes (Signed)
Summary: fatigue, sleepy  Phone Note Call from Patient   Caller: Patient Summary of Call: pt state that she was taken chantix but has now d/c this med. pt now c/o constant fatigue, and sleepiness since stopping med. pt denies any dizziness or nausea. pt has pending OV to come in to discuss fatigue. pt advise if any changes in condition prior to appt to give [place] a call, pt ok............Marland KitchenFelecia Deloach CMA  January 23, 2010 10:34 AM

## 2010-11-27 NOTE — Progress Notes (Signed)
Summary: Blood Work  Phone Note [facility] back at [employer] [phone]   Caller: Patient Call placed by: Harold Barban,  April 19, 2010 2:22 PM Summary of Call: Called patient in refrence to her message she left. What blood work would you like order.  Initial call taken by: Harold Barban,  April 19, 2010 2:22 PM  Follow-up for Phone Call        v70.0  244.9  TSH, bmp,hep, lipid, cbcd Follow-up by: Loreen Freud DO,  April 19, 2010 2:36 PM

## 2010-11-27 NOTE — Letter (Signed)
Summary: [facility]   Imported By: Lanelle Bal 01/18/2010 11:13:13  _____________________________________________________________________  External Attachment:    Type:   Image     Comment:   External Document

## 2010-11-29 NOTE — Assessment & Plan Note (Signed)
Summary: RTO 6 MONTHS/CBS   Vital Signs:  Patient profile:   55 year old female Weight:      174.6 pounds Pulse rate:   72 / minute Pulse rhythm:   regular BP sitting:   136 / 84  (left arm) Cuff size:   regular  Vitals Entered By: Almeta Monas CMA Duncan Dull) (November 02, 2010 9:47 AM) CC: 2 week f/u---x6days ago pt fell and hit her head and has had headaches   History of Present Illness: Pt here because she fell 6 days ago.   She tripped over a rug and hit her head. she had a headache that day in front.   She then had pain in back of head but now it is gone.  She does have some upper back pain but she also has slept in a different bed when she went to her mothers , who is in hospice.    Pt also needs meds refilled and handicap sticker form.     Hypertension follow-up      [name] who presents for Hypertension follow-up.  The patient denies lightheadedness, urinary frequency, headaches, edema, impotence, rash, and fatigue.  The patient denies the following associated symptoms: chest pain, chest pressure, exercise intolerance, dyspnea, palpitations, syncope, leg edema, and pedal edema.  Compliance with medications (by patient report) has been sporadic.  The patient reports that dietary compliance has been good.  Adjunctive measures currently used by the patient include salt restriction.    Preventive Screening-Counseling & Management  Alcohol-Tobacco     Smoking Status: quit > 6 months     Smoking Cessation Counseling: yes     Smoke Cessation Stage: quit     Year Quit: 10/2009  Caffeine-Diet-Exercise     Does Patient Exercise: no  Current Medications (verified): 1)  Tramadol Hcl 50 Mg  Tabs (Tramadol Hcl) .... Take 1 To 2 Tablet By Mouth Every 6 Hours As Needed 2)  Ranitidine Hcl 150 Mg  Caps (Ranitidine Hcl) .Marland Kitchen.. 1 By Mouth Two Times A Day 3)  Paxil 20 Mg  Tabs (Paroxetine Hcl) .Marland Kitchen.. 1 By Mouth Once Daily 4)  Neurontin 300 Mg Caps (Gabapentin) .... Take One  Tablet Three Times Daily. As Needed 5)  Synthroid 75 Mcg Tabs (Levothyroxine Sodium) .... Take 1 Tab Once Daily. 6)  Prenatal Multivit-Iron  Tabs (Prenatal Vit-Fe Sulfate-Fa) .Marland Kitchen.. 1 By Mouth Once Daily 7)  Slow Fe 160 (50 Fe) Mg Cr-Tabs (Ferrous Sulfate Dried) .Marland Kitchen.. 1 By Mouth Once Daily 8)  Fish Oil   Oil (Fish Oil) .... 1200mg  Once Daily 9)  Acai Berry 500 Mg Caps (Acai) .... Once Daily 10)  Ginkgo Biloba   Extr (Ginkgo Biloba) .... Once Daily 11)  Calcium Carbonate-Vitamin D 600-400 Mg-Unit  Tabs (Calcium Carbonate-Vitamin D) .... Once Daily 12)  Vitamin D 400 Unit  Tabs (Cholecalciferol) .... Once Daily 13)  Flax   Oil (Flaxseed (Linseed)) .... Once Daily 14)  Toprol Xl 50 Mg Xr24h-Tab (Metoprolol Succinate) .Marland Kitchen.. 1 By Mouth Two Times A Day 15)  Ativan 0.5 Mg Tabs (Lorazepam) .Marland Kitchen.. 1 By Mouth Three Times A Day As Needed 16)  Chantix Starting Month Pak 0.5 Mg X 11 & 1 Mg X 42 Tabs (Varenicline Tartrate) .... As Directed  Allergies (verified): 1)  ! Codeine  Past History:  Past medical, surgical, family and social histories (including risk factors) reviewed for relevance to current acute and chronic problems.  Past Medical History: Reviewed history from  05/31/2010 and no changes required. 1. Mixed connective tissue disease     with Raynaud's 2. GERD 3. Anxiety 4. Degenerative disc disease 5. Chest pain      -Myoview 2008 negative 6. Palpatations 7. Dizziness 8. Hypothyroidism  Past Surgical History: Reviewed history from 07/13/2009 and no changes required. C spine surgery x 3 Lower Back  Right Leg Surgery Carpal Tunnel Release Bilateral   Family History: Reviewed history from 07/13/2009 and no changes required. Family History of Colon Cancer:MGF  Family History of Breast Cancer:Mother  Family History of Heart Disease: Father   Social History: Reviewed history from 07/13/2009 and no changes required. Occupation: Advice worker 3 boys  Patient currently  smokes.  Alcohol Use - yes: 2 weekly Daily Caffeine Use: 1 daily  Illicit Drug Use - no Patient does not get regular exercise.   Review of Systems      See HPI  Physical Exam  General:  Well-developed,well-nourished,in no acute distress; alert,appropriate and cooperative throughout examination Eyes:  pupils equal, pupils round, and pupils reactive to light.  pupils equal, pupils round, and pupils reactive to light.   Neck:  No deformities, masses, or tenderness noted. Lungs:  Normal respiratory effort, chest expands symmetrically. Lungs are clear to auscultation, no crackles or wheezes. Msk:  normal ROM, no joint swelling, no joint warmth, no redness over joints, no joint deformities, no joint instability, and no crepitation.  normal ROM, no joint swelling, no joint warmth, no redness over joints, no joint deformities, no joint instability, and no crepitation.   Extremities:  No clubbing, cyanosis, edema, or deformity noted with normal full range of motion of all joints.   Neurologic:  strength normal in all extremities, gait normal, and DTRs symmetrical and normal.  strength normal in all extremities, gait normal, and DTRs symmetrical and normal.   Cervical Nodes:  No lymphadenopathy noted Psych:  Cognition and judgment appear intact. Alert and cooperative with normal attention span and concentration. No apparent delusions, illusions, hallucinations   Impression & Recommendations:  Problem # 1:  ESSENTIAL HYPERTENSION (ICD-401.9) d/w pt importance of taking med everyday Her updated medication list for this problem includes:    Toprol Xl 50 Mg Xr24h-tab (Metoprolol succinate) .Marland Kitchen... 1 by mouth two times a day  BP today: 136/84 Prior BP: 140/90 (10/04/2010)  Labs Reviewed: K+: 4.7 (05/04/2010) Creat: : 0.6 (05/04/2010)   Chol: 201 (04/20/2010)   HDL: 73.00 (04/20/2010)   TG: 81.0 (04/20/2010)  Orders: Venipuncture (32355) TLB-IBC Pnl (Iron/FE;Transferrin) (83550-IBC) TLB-Lipid  Panel (80061-LIPID) TLB-BMP (Basic Metabolic Panel-BMET) (80048-METABOL) TLB-CBC Platelet - w/Differential (85025-CBCD) TLB-Hepatic/Liver Function Pnl (80076-HEPATIC) TLB-TSH (Thyroid Stimulating Hormone) (84443-TSH) Specimen Handling (73220)  Problem # 2:  BACK PAIN, THORACIC REGION (ICD-724.1)  Her updated medication list for this problem includes:    Tramadol Hcl 50 Mg Tabs (Tramadol hcl) .Marland Kitchen... Take 1 to 2 tablet by mouth every 6 hours as needed    Flexeril 10 Mg Tabs (Cyclobenzaprine hcl) .Marland Kitchen... 1 by mouth three times a day  Discussed use of moist heat or ice, modified activities, medications, and stretching/strengthening exercises. Back care instructions given. To be seen in 2 weeks if no improvement; sooner if worsening of symptoms.   Orders: Venipuncture (25427) TLB-IBC Pnl (Iron/FE;Transferrin) (83550-IBC) TLB-Lipid Panel (80061-LIPID) TLB-BMP (Basic Metabolic Panel-BMET) (80048-METABOL) TLB-CBC Platelet - w/Differential (85025-CBCD) TLB-Hepatic/Liver Function Pnl (80076-HEPATIC) TLB-TSH (Thyroid Stimulating Hormone) (84443-TSH) Specimen Handling (06237)  Problem # 3:  ANXIETY (ICD-300.00)  Her updated medication list for this problem includes:  Paxil 20 Mg Tabs (Paroxetine hcl) .Marland Kitchen... 1 by mouth once daily    Ativan 0.5 Mg Tabs (Lorazepam) .Marland Kitchen... 1 by mouth three times a day as needed  Discussed medication use and relaxation techniques.   Problem # 4:  HYPERGLYCEMIA, FASTING (ICD-790.29)  Orders: Venipuncture (16109) TLB-IBC Pnl (Iron/FE;Transferrin) (83550-IBC) TLB-Lipid Panel (80061-LIPID) TLB-BMP (Basic Metabolic Panel-BMET) (80048-METABOL) TLB-CBC Platelet - w/Differential (85025-CBCD) TLB-Hepatic/Liver Function Pnl (80076-HEPATIC) TLB-TSH (Thyroid Stimulating Hormone) (84443-TSH) Specimen Handling (60454)  Labs Reviewed: Creat: 0.6 (05/04/2010)     Problem # 5:  UNSPECIFIED ANEMIA (ICD-285.9)  Her updated medication list for this problem includes:     Slow Fe 160 (50 Fe) Mg Cr-tabs (Ferrous sulfate dried) .Marland Kitchen... 1 by mouth once daily  Hgb: 11.9 (04/20/2010)   Hct: 35.2 (04/20/2010)   Platelets: 239.0 (04/20/2010) RBC: 4.16 (04/20/2010)   RDW: 14.2 (04/20/2010)   WBC: 5.3 (04/20/2010) MCV: 84.6 (04/20/2010)   MCHC: 33.7 (04/20/2010) TSH: 2.34 (04/20/2010)  Orders: Venipuncture (09811) TLB-IBC Pnl (Iron/FE;Transferrin) (83550-IBC) TLB-Lipid Panel (80061-LIPID) TLB-BMP (Basic Metabolic Panel-BMET) (80048-METABOL) TLB-CBC Platelet - w/Differential (85025-CBCD) TLB-Hepatic/Liver Function Pnl (80076-HEPATIC) TLB-TSH (Thyroid Stimulating Hormone) (84443-TSH) Specimen Handling (91478)  Problem # 6:  GERD (ICD-530.81)  Her updated medication list for this problem includes:    Ranitidine Hcl 150 Mg Caps (Ranitidine hcl) .Marland Kitchen... 1 by mouth two times a day  Diagnostics Reviewed:  EGD: Location: [facility]   (07/27/2009) Discussed lifestyle modifications, diet, antacids/medications, and preventive measures. Handout provided.   Problem # 7:  HYPOTHYROIDISM (ICD-244.9)  Her updated medication list for this problem includes:    Synthroid 75 Mcg Tabs (Levothyroxine sodium) .Marland Kitchen... Take 1 tab once daily.  Labs Reviewed: TSH: 2.34 (04/20/2010)    HgBA1c: 6.3 (05/04/2010) Chol: 201 (04/20/2010)   HDL: 73.00 (04/20/2010)   TG: 81.0 (04/20/2010)  Her updated medication list for this problem includes:    Synthroid 75 Mcg Tabs (Levothyroxine sodium) .Marland Kitchen... Take 1 tab once daily.  Complete Medication List: 1)  Tramadol Hcl 50 Mg Tabs (Tramadol hcl) .... Take 1 to 2 tablet by mouth every 6 hours as needed 2)  Ranitidine Hcl 150 Mg Caps (Ranitidine hcl) .Marland Kitchen.. 1 by mouth two times a day 3)  Paxil 20 Mg Tabs (Paroxetine hcl) .Marland Kitchen.. 1 by mouth once daily 4)  Neurontin 300 Mg Caps (Gabapentin) .... Take one tablet three times daily. as needed 5)  Synthroid 75 Mcg Tabs (Levothyroxine sodium) .... Take 1 tab once daily. 6)  Prenatal  Multivit-iron Tabs (Prenatal vit-fe sulfate-fa) .Marland Kitchen.. 1 by mouth once daily 7)  Slow Fe 160 (50 Fe) Mg Cr-tabs (Ferrous sulfate dried) .Marland Kitchen.. 1 by mouth once daily 8)  Fish Oil Oil (Fish oil) .... 1200mg  once daily 9)  Acai Berry 500 Mg Caps (Acai) .... Once daily 10)  Ginkgo Biloba Extr (Ginkgo biloba) .... Once daily 11)  Calcium Carbonate-vitamin D 600-400 Mg-unit Tabs (Calcium carbonate-vitamin d) .... Once daily 12)  Vitamin D 400 Unit Tabs (Cholecalciferol) .... Once daily 13)  Flax Oil (Flaxseed (linseed)) .... Once daily 14)  Toprol Xl 50 Mg Xr24h-tab (Metoprolol succinate) .Marland Kitchen.. 1 by mouth two times a day 15)  Ativan 0.5 Mg Tabs (Lorazepam) .Marland Kitchen.. 1 by mouth three times a day as needed 16)  Chantix Starting Month Pak 0.5 Mg X 11 & 1 Mg X 42 Tabs (Varenicline tartrate) .... As directed 17)  Flexeril 10 Mg Tabs (Cyclobenzaprine hcl) .Marland Kitchen.. 1 by mouth three times a day  Other Orders: Tdap => 46yrs IM (29562) Admin 1st  Vaccine (56213)  Patient Instructions: 1)  Please schedule a follow-up appointment in 6 months -- cpe Prescriptions: FLEXERIL 10 MG TABS (CYCLOBENZAPRINE HCL) 1 by mouth three times a day  #30 x 0   Entered and Authorized by:   Loreen Freud DO   Signed by:   Loreen Freud DO on 11/02/2010   Method used:   Electronically to        [facility] [address] (retail)       [phone]       [place], [place]  [address]       Ph: [phone]       Fax: [phone]   RxID:   [id number] TOPROL XL 50 MG XR24H-TAB (METOPROLOL SUCCINATE) 1 by mouth two times a day  #60 x 2   Entered and Authorized by:   Loreen Freud DO   Signed by:   Loreen Freud DO on 11/02/2010   Method used:   Electronically to        [facility] [address] (retail)       [phone]       [place], [place]  [address]       Ph: [phone]       Fax: [phone]   RxID:   [id number]    Orders Added: 1)  Venipuncture  [76283] 2)  TLB-IBC Pnl (Iron/FE;Transferrin) [83550-IBC] 3)  TLB-Lipid Panel [80061-LIPID] 4)  TLB-BMP (Basic Metabolic Panel-BMET) [80048-METABOL] 5)  TLB-CBC Platelet - w/Differential [85025-CBCD] 6)  TLB-Hepatic/Liver Function Pnl [80076-HEPATIC] 7)  TLB-TSH (Thyroid Stimulating Hormone) [84443-TSH] 8)  Specimen Handling [99000] 9)  Est. Patient Level IV [15176] 10)  Tdap => 53yrs IM [90715] 11)  Admin 1st Vaccine [16073]   Immunizations Administered:  Tetanus Vaccine:    Vaccine Type: Tdap    Site: left deltoid    Mfr: Merck    Dose: 0.5 ml    Route: IM    Given by: Almeta Monas CMA (AAMA)    Exp. Date: 08/16/2012    Lot #: [id number]    VIS given: 09/14/08 version given November 02, 2010.   Immunizations Administered:  Tetanus Vaccine:    Vaccine Type: Tdap    Site: left deltoid    Mfr: Merck    Dose: 0.5 ml    Route: IM    Given by: Almeta Monas CMA (AAMA)    Exp. Date: 08/16/2012    Lot #: [id number]    VIS given: 09/14/08 version given November 02, 2010.

## 2011-01-03 ENCOUNTER — Ambulatory Visit (INDEPENDENT_AMBULATORY_CARE_PROVIDER_SITE_OTHER): Payer: Self-pay | Admitting: Family Medicine

## 2011-01-03 ENCOUNTER — Other Ambulatory Visit: Payer: Self-pay | Admitting: Family Medicine

## 2011-01-03 ENCOUNTER — Encounter: Payer: Self-pay | Admitting: Family Medicine

## 2011-01-03 DIAGNOSIS — R7309 Other abnormal glucose: Secondary | ICD-10-CM

## 2011-01-03 DIAGNOSIS — D649 Anemia, unspecified: Secondary | ICD-10-CM

## 2011-01-03 DIAGNOSIS — I10 Essential (primary) hypertension: Secondary | ICD-10-CM

## 2011-01-03 DIAGNOSIS — F17200 Nicotine dependence, unspecified, uncomplicated: Secondary | ICD-10-CM

## 2011-01-03 DIAGNOSIS — E039 Hypothyroidism, unspecified: Secondary | ICD-10-CM

## 2011-01-03 LAB — CONVERTED CEMR LAB
Ketones, urine, test strip: NEGATIVE
Nitrite: NEGATIVE
Urobilinogen, UA: NEGATIVE
WBC Urine, dipstick: NEGATIVE

## 2011-01-03 LAB — HEPATIC FUNCTION PANEL
ALT: 17 U/L (ref 0–35)
AST: 23 U/L (ref 0–37)
Albumin: 4.1 g/dL (ref 3.5–5.2)
Alkaline Phosphatase: 80 U/L (ref 39–117)
Bilirubin, Direct: 0.1 mg/dL (ref 0.0–0.3)
Total Protein: 7.3 g/dL (ref 6.0–8.3)

## 2011-01-03 LAB — BASIC METABOLIC PANEL
CO2: 29 mEq/L (ref 19–32)
Calcium: 9.2 mg/dL (ref 8.4–10.5)
Chloride: 107 mEq/L (ref 96–112)
Creatinine, Ser: 0.6 mg/dL (ref 0.4–1.2)
Sodium: 142 mEq/L (ref 135–145)

## 2011-01-03 LAB — CBC WITH DIFFERENTIAL/PLATELET
Basophils Relative: 0.2 % (ref 0.0–3.0)
Eosinophils Absolute: 0.1 10*3/uL (ref 0.0–0.7)
Eosinophils Relative: 1.7 % (ref 0.0–5.0)
Hemoglobin: 12.4 g/dL (ref 12.0–15.0)
Lymphocytes Relative: 40.1 % (ref 12.0–46.0)
MCHC: 33.8 g/dL (ref 30.0–36.0)
Neutro Abs: 2.4 10*3/uL (ref 1.4–7.7)
Neutrophils Relative %: 50.3 % (ref 43.0–77.0)
RBC: 4.34 Mil/uL (ref 3.87–5.11)
WBC: 4.9 10*3/uL (ref 4.5–10.5)

## 2011-01-03 LAB — LIPID PANEL
Cholesterol: 200 mg/dL (ref 0–200)
LDL Cholesterol: 128 mg/dL — ABNORMAL HIGH (ref 0–99)
Triglycerides: 77 mg/dL (ref 0.0–149.0)
VLDL: 15.4 mg/dL (ref 0.0–40.0)

## 2011-01-04 ENCOUNTER — Encounter: Payer: Self-pay | Admitting: Family Medicine

## 2011-01-08 NOTE — Assessment & Plan Note (Signed)
Summary: RTO 3 MONTHS///CBS   Vital Signs:  Patient profile:   55 year old female Weight:      177.4 pounds Pulse rate:   72 / minute Pulse rhythm:   regular BP sitting:   140 / 86  (left arm) Cuff size:   regular  Vitals Entered By: Almeta Monas CMA Duncan Dull) (January 03, 2011 11:18 AM) CC: F/U OV---pt admitted to not taking her meds regularly   History of Present Illness:  Hypertension follow-up      [name] who presents for Hypertension follow-up.  The patient denies lightheadedness, urinary frequency, headaches, edema, impotence, rash, and fatigue.  The patient denies the following associated symptoms: chest pain, chest pressure, exercise intolerance, dyspnea, palpitations, syncope, leg edema, and pedal edema.  Compliance with medications (by patient report) has been sporadic.  The patient reports that dietary compliance has been good.  The patient reports no exercise.  Adjunctive measures currently used by the patient include relaxation.      Problems Prior to Update: 1)  Hypothyroidism  (ICD-244.9) 2)  Back Pain, Thoracic Region  (ICD-724.1) 3)  Essential Hypertension  (ICD-401.9) 4)  Chest Pain, Precordial  (ICD-786.51) 5)  Memory Loss  (ICD-780.93) 6)  Unspecified Anemia  (ICD-285.9) 7)  Hyperglycemia, Fasting  (ICD-790.29) 8)  Diastolic Dysfunction  (ICD-429.9) 9)  Fatigue  (ICD-780.79) 10)  Back Pain With Radiculopathy  (ICD-729.2) 11)  Change in Bowels  (ICD-787.99) 12)  Chest Pain, Atypical  (ICD-786.59) 13)  Tobacco User  (ICD-305.1) 14)  Restless Legs Syndrome  (ICD-333.94) 15)  Leg Pain, Bilateral  (ICD-729.5) 16)  Plantar Wart  (ICD-078.12) 17)  Uri  (ICD-465.9) 18)  Palpitations, Recurrent  (ICD-785.1) 19)  Anxiety  (ICD-300.00) 20)  Gerd  (ICD-530.81) 21)  Weight Loss  (ICD-783.21) 22)  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 Take 1 To 2 Tablet By Mouth Every 6 Hours As Needed 2)  Ranitidine Hcl 150 Mg  Caps (Ranitidine Hcl) .Marland Kitchen.. 1 By Mouth Two Times A Day 3)  Paxil 20 Mg  Tabs (Paroxetine Hcl) .Marland Kitchen.. 1 By Mouth Once Daily 4)  Neurontin 300 Mg Caps (Gabapentin) .... Take One Tablet Three Times Daily. As Needed 5)  Synthroid 75 Mcg Tabs (Levothyroxine Sodium) .... Take 1 Tab Once Daily. 6)  Prenatal Multivit-Iron  Tabs (Prenatal Vit-Fe Sulfate-Fa) .Marland Kitchen.. 1 By Mouth Once Daily 7)  Slow Fe 160 (50 Fe) Mg Cr-Tabs (Ferrous Sulfate Dried) .Marland Kitchen.. 1 By Mouth Once Daily 8)  Fish Oil   Oil (Fish Oil) .... 1200mg  Once Daily 9)  Acai Berry 500 Mg Caps (Acai) .... Once Daily 10)  Ginkgo Biloba   Extr (Ginkgo Biloba) .... Once Daily 11)  Calcium Carbonate-Vitamin D 600-400 Mg-Unit  Tabs (Calcium Carbonate-Vitamin D) .... Once Daily 12)  Vitamin D 400 Unit  Tabs (Cholecalciferol) .... Once Daily 13)  Flax   Oil (Flaxseed (Linseed)) .... Once Daily 14)  Toprol Xl 50 Mg Xr24h-Tab (Metoprolol Succinate) .Marland Kitchen.. 1 By Mouth Two Times A Day 15)  Ativan 0.5 Mg Tabs (Lorazepam) .Marland Kitchen.. 1 By Mouth Three Times A Day As Needed 16)  Chantix Starting Month Pak 0.5  Mg X 11 & 1 Mg X 42 Tabs (Varenicline Tartrate) .... As Directed 17)  Flexeril 10 Mg Tabs (Cyclobenzaprine Hcl) .Marland Kitchen.. 1 By Mouth Three Times A Day  Current Medications (verified): 1)  Tramadol Hcl 50 Mg  Tabs (Tramadol Hcl) .... Take 1 To 2 Tablet By Mouth Every 6 Hours As Needed 2)  Ranitidine Hcl 150 Mg  Caps (Ranitidine Hcl) .Marland Kitchen.. 1 By Mouth Two Times A Day 3)  Paxil 20 Mg  Tabs (Paroxetine Hcl) .Marland Kitchen.. 1 By Mouth Once Daily 4)  Neurontin 300 Mg Caps (Gabapentin) .... Take One Tablet Three Times Daily. As Needed 5)  Synthroid 75 Mcg Tabs (Levothyroxine Sodium) .... Take 1 Tab Once Daily. 6)  Slow  Fe 160 (50 Fe) Mg Cr-Tabs (Ferrous Sulfate Dried) .Marland Kitchen.. 1 By Mouth Once Daily 7)  Fish Oil   Oil (Fish Oil) .... 1200mg  Once Daily 8)  Ginkgo Biloba   Extr (Ginkgo Biloba) .... Once Daily 9)  Vitamin D 400 Unit  Tabs (Cholecalciferol) .... Once Daily 10)  Toprol Xl 50 Mg Xr24h-Tab (Metoprolol Succinate) .Marland Kitchen.. 1 By Mouth Two Times A Day 11)  Ativan 0.5 Mg Tabs (Lorazepam) .Marland Kitchen.. 1 By Mouth Three Times A Day As Needed 12)  Flexeril 10 Mg Tabs (Cyclobenzaprine Hcl) .Marland Kitchen.. 1 By Mouth Three Times A Day 13)  Gnp Resveratrol Red Wine Ext  Caps 125mg  14)  Bacopa--Mental Alterness  Allergies (verified): 1)  ! Codeine  Past History:  Family History: Last updated: 07/13/2009 Family History of Colon Cancer:MGF  Family History of Breast Cancer:Mother  Family History of Heart Disease: Father   Social History: Last updated: 07/13/2009 Occupation: Engineer, materials Seperated 3 boys  Patient currently smokes.  Alcohol Use - yes: 2 weekly Daily Caffeine Use: 1 daily  Illicit Drug Use - no Patient does not get regular exercise.   Risk Factors: Exercise: no (11/02/2010)  Risk Factors: Smoking Status: quit > 6 months (11/02/2010)  Past medical, surgical, family and social histories (including risk factors) reviewed for relevance to current acute and chronic problems.  Past Medical History: Reviewed history from 05/31/2010 and no changes required. 1. Mixed connective tissue disease     with Raynaud's 2. GERD 3. Anxiety 4. Degenerative disc disease 5. Chest pain      -Myoview 2008 negative 6. Palpatations 7. Dizziness 8. Hypothyroidism  Past Surgical History: Reviewed history from 07/13/2009 and no changes required. C spine surgery x 3 Lower Back  Right Leg Surgery Carpal Tunnel Release Bilateral   Family History: Reviewed history from 07/13/2009 and no changes required. Family History of Colon Cancer:MGF  Family History of Breast Cancer:Mother  Family History of Heart Disease:  Father   Social History: Reviewed history from 07/13/2009 and no changes required. 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  Alcohol Use - yes: 2 weekly Daily Caffeine Use: 1 daily  Illicit Drug Use - no Patient does not get regular exercise.   Review of Systems      See HPI  Physical Exam  General:  Well-developed,well-nourished,in no acute distress; alert,appropriate and cooperative throughout examination Lungs:  Normal respiratory effort, chest expands symmetrically. Lungs are clear to auscultation, no crackles or wheezes. Heart:  normal rate and no murmur.   Extremities:  No clubbing, cyanosis, edema, or deformity noted with normal full range of motion of all joints.   Psych:  Cognition and judgment appear intact. Alert and cooperative with normal attention span and concentration. No apparent delusions, illusions, hallucinations  Impression & Recommendations:  Problem # 1:  HYPOTHYROIDISM (ICD-244.9)  Her updated medication list for this problem includes:    Synthroid 75 Mcg Tabs (Levothyroxine sodium) .Marland Kitchen... Take 1 tab once daily.  Orders: Venipuncture (41324) TLB-Lipid Panel (80061-LIPID) TLB-BMP (Basic Metabolic Panel-BMET) (80048-METABOL) TLB-CBC Platelet - w/Differential (85025-CBCD) TLB-Hepatic/Liver Function Pnl (80076-HEPATIC) TLB-TSH (Thyroid Stimulating Hormone) (84443-TSH) TLB-A1C / Hgb A1C (Glycohemoglobin) (83036-A1C) Specimen Handling (40102) UA Dipstick W/ Micro (manual) (81000)  Labs Reviewed: TSH: 1.26 (11/02/2010)    HgBA1c: 6.3 (05/04/2010) Chol: 197 (11/02/2010)   HDL: 59.80 (11/02/2010)   LDL: 123 (11/02/2010)   TG: 69.0 (11/02/2010)  Problem # 2:  ESSENTIAL HYPERTENSION (ICD-401.9)  Her updated medication list for this problem includes:    Toprol Xl 50 Mg Xr24h-tab (Metoprolol succinate) .Marland Kitchen... 1 by mouth two times a day  Orders: Venipuncture (72536) TLB-Lipid Panel (80061-LIPID) TLB-BMP (Basic Metabolic  Panel-BMET) (80048-METABOL) TLB-CBC Platelet - w/Differential (85025-CBCD) TLB-Hepatic/Liver Function Pnl (80076-HEPATIC) TLB-TSH (Thyroid Stimulating Hormone) (84443-TSH) TLB-A1C / Hgb A1C (Glycohemoglobin) (83036-A1C) Specimen Handling (64403) UA Dipstick W/ Micro (manual) (81000)  BP today: 140/86 Prior BP: 136/84 (11/02/2010)  Labs Reviewed: K+: 4.2 (11/02/2010) Creat: : 0.7 (11/02/2010)   Chol: 197 (11/02/2010)   HDL: 59.80 (11/02/2010)   LDL: 123 (11/02/2010)   TG: 69.0 (11/02/2010)  Problem # 3:  HYPERGLYCEMIA, FASTING (ICD-790.29)  Orders: Venipuncture (47425) TLB-Lipid Panel (80061-LIPID) TLB-BMP (Basic Metabolic Panel-BMET) (80048-METABOL) TLB-CBC Platelet - w/Differential (85025-CBCD) TLB-Hepatic/Liver Function Pnl (80076-HEPATIC) TLB-TSH (Thyroid Stimulating Hormone) (84443-TSH) TLB-A1C / Hgb A1C (Glycohemoglobin) (83036-A1C) Specimen Handling (95638)  Labs Reviewed: Creat: 0.7 (11/02/2010)     Problem # 4:  DIASTOLIC DYSFUNCTION (ICD-429.9)  Problem # 5:  TOBACCO USER (ICD-305.1)  The following medications were removed from the medication list:    Chantix Starting Month Pak 0.5 Mg X 11 & 1 Mg X 42 Tabs (Varenicline tartrate) .Marland Kitchen... As directed  Encouraged smoking cessation and discussed different methods for smoking cessation.   Orders: Tobacco use cessation intermediate 3-10 minutes (99406)  Complete Medication List: 1)  Tramadol Hcl 50 Mg Tabs (Tramadol hcl) .... Take 1 to 2 tablet by mouth every 6 hours as needed 2)  Ranitidine Hcl 150 Mg Caps (Ranitidine hcl) .Marland Kitchen.. 1 by mouth two times a day 3)  Paxil 20 Mg Tabs (Paroxetine hcl) .Marland Kitchen.. 1 by mouth once daily 4)  Neurontin 300 Mg Caps (Gabapentin) .... Take one tablet three times daily. as needed 5)  Synthroid 75 Mcg Tabs (Levothyroxine sodium) .... Take 1 tab once daily. 6)  Slow Fe 160 (50 Fe) Mg Cr-tabs (Ferrous sulfate dried) .Marland Kitchen.. 1 by mouth once daily 7)  Fish Oil Oil (Fish oil) .... 1200mg  once  daily 8)  Ginkgo Biloba Extr (Ginkgo biloba) .... Once daily 9)  Vitamin D 400 Unit Tabs (Cholecalciferol) .... Once daily 10)  Toprol Xl 50 Mg Xr24h-tab (Metoprolol succinate) .Marland Kitchen.. 1 by mouth two times a day 11)  Ativan 0.5 Mg Tabs (Lorazepam) .Marland Kitchen.. 1 by mouth three times a day as needed 12)  Flexeril 10 Mg Tabs (Cyclobenzaprine hcl) .Marland Kitchen.. 1 by mouth three times a day 13)  Gnp Resveratrol Red Wine Ext Caps 125mg   14)  Bacopa--mental Alterness   Patient Instructions: 1)  Please schedule a follow-up appointment in 6 months .  Prescriptions: TOPROL XL 50 MG XR24H-TAB (METOPROLOL SUCCINATE) 1 by mouth two times a day  #60 x 5   Entered and Authorized by:   Loreen Freud DO   Signed by:   Loreen Freud DO on 01/03/2011  Method used:   Electronically to        [facility] (retail)       [phone]       [place], [place]  [address]       Ph: [phone]       Fax: [phone]   RxID:   [id number] SYNTHROID 75 MCG TABS (LEVOTHYROXINE SODIUM) take 1 tab once daily.  #30 Each x 5   Entered and Authorized by:   Loreen Freud DO   Signed by:   Loreen Freud DO on 01/03/2011   Method used:   Electronically to        [facility] [address] (retail)       [phone]       [place], [place]  [address]       Ph: [phone]       Fax: [phone]   RxID:   [id number] NEURONTIN 300 MG CAPS (GABAPENTIN) TAKE ONE TABLET THREE TIMES DAILY. as needed  #90 x 3   Entered and Authorized by:   Loreen Freud DO   Signed by:   Loreen Freud DO on 01/03/2011   Method used:   Electronically to        [facility] [address] (retail)       [phone]       [place], [place]  [address]       Ph: [phone]       Fax: [phone]   RxID:   [id number] PAXIL 20 MG  TABS (PAROXETINE HCL) 1 by mouth once daily  #90 Each x 3   Entered and Authorized by:   Loreen Freud DO   Signed  by:   Loreen Freud DO on 01/03/2011   Method used:   Electronically to        [facility] [address] (retail)       [phone]       [place], [place]  [address]       Ph: [phone]       Fax: [phone]   RxID:   [id number] RANITIDINE HCL 150 MG  CAPS (RANITIDINE HCL) 1 by mouth two times a day  #60 x 11   Entered and Authorized by:   Loreen Freud DO   Signed by:   Loreen Freud DO on 01/03/2011   Method used:   Electronically to        [facility] [address] (retail)       [phone]       [place], [place]  [address]       Ph: [phone]       Fax: [phone]   RxID:   [id number]    Orders Added: 1)  Venipuncture [88502] 2)  TLB-Lipid Panel [80061-LIPID] 3)  TLB-BMP (Basic Metabolic Panel-BMET) [80048-METABOL] 4)  TLB-CBC Platelet - w/Differential [85025-CBCD] 5)  TLB-Hepatic/Liver Function Pnl [80076-HEPATIC] 6)  TLB-TSH (Thyroid Stimulating Hormone) [84443-TSH] 7)  TLB-A1C / Hgb A1C (Glycohemoglobin) [83036-A1C] 8)  Specimen Handling [99000] 9)  UA Dipstick W/ Micro (manual) [81000] 10)  Est. Patient Level IV [77412] 11)  Tobacco use cessation intermediate 3-10 minutes [99406]    Laboratory Results   Urine Tests   Date/Time Reported: January 03, 2011 1:36 PM   Routine Urinalysis   Color: yellow Appearance: Clear Glucose: negative   (Normal Range: Negative) Bilirubin: negative   (Normal Range: Negative) Ketone: negative   (Normal Range: Negative) Spec. Gravity: 1.020   (Normal Range: 1.003-1.035) Blood: negative   (Normal Range: Negative) pH: 5.0   (Normal Range: 5.0-8.0) Protein: negative   (Normal Range: Negative) Urobilinogen: negative   (Normal Range: 0-1) Nitrite: negative   (Normal Range: Negative) Leukocyte Esterace: negative   (Normal Range: Negative)    Comments: Floydene Flock  January 03, 2011 1:36 PM

## 2011-03-15 NOTE — Op Note (Signed)
Debra [name], SOLECKI               ACCOUNT NO.:  [id number]   MEDICAL RECORD NO.:  [id number]          PATIENT TYPE:  OIB   LOCATION:  2899                         FACILITY:  [facility]   PHYSICIAN:  Hilda Lias, M.D.   DATE OF BIRTH:  20-Oct-1956   DATE OF PROCEDURE:  12/12/2004  DATE OF DISCHARGE:                                 OPERATIVE REPORT   PREOPERATIVE DIAGNOSIS:  Left carpal tunnel syndrome.   POSTOPERATIVE DIAGNOSIS:  Left carpal tunnel syndrome.   PROCEDURE:  Decompression of the left median nerve.   SURGEON:  Hilda Lias, M.D.   ANESTHESIA:  IV sedation plus local.   CLINICAL HISTORY:  The patient is being admitted because of weakness and  pain of the left hand.  About six weeks ago, she underwent decompression of  the right median nerve.  Now she is coming to have the left one  decompressed.  The risks were explained to her, including the possibility of  infection, no improvement whatsoever and need for further surgery.   DESCRIPTION OF PROCEDURE:  The patient was taken to the OR and the left arm  and hand were prepped with Betadine.  Drapes were applied.  Infiltration  with Xylocaine was made.  IV sedation was accomplished.  Then an incision  following the base of the thumb through the skin, volar ligament and a thick  carpal ligament was achieved.  Decompression distal and proximal along the  ulnar aspect of the nerve was achieved.  At the end, we had good  decompression and the patient able to move the fingers, including the thumb.  From the on, hemostasis was done.  __________ was done.  The wound was  closed with nylon.      EB/MEDQ  D:  12/12/2004  T:  12/12/2004  Job:  [id number]

## 2011-03-15 NOTE — H&P (Signed)
[facility]  Patient:    Debra [name], Debra [name]                        MRN: [id number] Adm. Date:  08/09/00 Attending:  Janine Limbo, M.D.                         History and Physical  HISTORY OF PRESENT ILLNESS:   The patient is a 55 year old female, para 3-0-2-3, who presents for loop electrosurgical excision procedure.  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  On May 27, 2000, the patient underwent colposcopy.  No lesions were seen on the ectocervix.  we were unable to visualize the transition zone.  An ECC was performed and showed benign endocervical tissue.  koilocytes were noted.  HPV testing was done and the patient was found to have both high risk and low risk HPV.  The patient reports that, many years ago, she did have colposcopy, and she could not remember what was done at that time.  ALLERGIES:                    PENICILLIN.  PAST OBSTETRIC HISTORY:       The patient has had three term deliveries and two miscarriages.  PAST MEDICAL HISTORY:         The patient reports that she has anxiety and is currently being treated with Paxil and Nortriptyline.  She also has hypothyroidism and is being treated with Synthroid.  The patient also takes Vioxx as needed for pain.  The patient also reports that she has had seizures in the past, migraine headaches, bladder infections, anemia, intestinal problems, epilepsy, kidney infections, asthma, bleeding problems, and varicose veins.  She also says that she was told of lupus and rheumatoid arthritis. The patient says that she has had four operations on her neck.  SOCIAL HISTORY:               The patient is separated.  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  She smokes 1/2 to 1 pack of cigarettes each day.  She drinks socially.  She denies recreational drug use.  REVIEW OF SYSTEMS:            Please see the HPI.  FAMILY HISTORY:               The patient reports a family history of  hear disease, mitral valve prolapse, varicose veins, asthma, cancer, bowel problems, high blood pressure, diabetes, emotional problems and joint problems.  PHYSICAL EXAMINATION:  VITAL SIGNS:                  Weight 170 pounds, height 5 feet 4 inches.  HEENT:                        Within normal limits.  CHEST:                        Clear.  HEART:                        Regular rate and rhythm.  ABDOMEN:                      Nontender.  EXTREMITIES:  Within normal limits.  NEUROLOGIC:                   Normal.  PELVIC:                       External genitalia normal.  The vagina is normal.  The cervix is nontender.  The uterus is normal size, shape and consistency.  Adnexa no masses.  ASSESSMENT:                   1. 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                               2. Inadequate colposcopy because the transition                                  zone could not be adequately visualized.                               3. 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                               4. Anxiety disorder.  PLAN:                         The patient will undergo a loop electrosurgical excision procedure.  She understands the indications for her procedure and she accepts the risks of, but not limited to anesthesia complications; bleeding; infections and possible damage to surrounding organs. DD:  08/08/00 TD:  08/08/00 Job: [id number]

## 2011-03-15 NOTE — Op Note (Signed)
NAMENAVEAH, BRAVE               ACCOUNT NO.:  [id number]   MEDICAL RECORD NO.:  [id number]          PATIENT TYPE:  OIB   LOCATION:  2899                         FACILITY:  [facility]   PHYSICIAN:  Hilda Lias, M.D.   DATE OF BIRTH:  03-11-1956   DATE OF PROCEDURE:  11/13/2004  DATE OF DISCHARGE:                                 OPERATIVE REPORT   PREOPERATIVE DIAGNOSIS:  Bilateral carpal tunnel syndrome probably worse on  the left one.   POSTOPERATIVE DIAGNOSIS:  Bilateral carpal tunnel syndrome probably worse on  the left one.   PROCEDURE:  Decompression of the right median nerve.   ANESTHESIA:  Local plus intravenous sedation.   CLINICAL HISTORY:  The patient has an anterior and posterior cervical  fusion.  She had been complaining for pain both hands, right worse than left  one.  A nerve conduction test showed that she has a severe case of carpal  tunnel syndrome, right worse than the left one.  Patient wanted to proceed  with surgery, and the surgery was fully explained to her including the  possibility of no improvement, infection, __________  damage to the nerve.   PROCEDURE:  The patient was taken to the OR, and the right hand and arm were  prepped with Betadine.  Drapes were applied.  Infiltration with Xylocaine  was made.  An incision was along the base of the thumb was made through the  skin, volar ligament, through a thick, calcified carpal ligament.  Decompressed it proximal and distal out of the ulnar aspect of the nerve was  done.  The nerve was found to be flat and yellowish.  Having good  decompression, the area was irrigated.  The patient was able to move her  fingers including the thumb.  Hemostasis was done with bipolar.  Then, the  wound was closed with Vicryl.  The patient will be going home once she is  stable and to be seen by me in my office tomorrow.       EB/MEDQ  D:  11/13/2004  T:  11/13/2004  Job:  [id number]   cc:   Marne A. Milinda Antis, M.D. [facility]

## 2011-03-15 NOTE — H&P (Signed)
NAMEEARLYNE, Debra [name]               ACCOUNT NO.:  [id number]   MEDICAL RECORD NO.:  [id number]          PATIENT TYPE:  INP   LOCATION:  4707                         FACILITY:  [facility]   PHYSICIAN:  Debra Amel, MD    DATE OF BIRTH:  [birth year]   DATE OF ADMISSION:  11/06/2006  DATE OF DISCHARGE:                              HISTORY & PHYSICAL   PRIMARY CARE PHYSICIAN:  Dr. Debby [name].   CHIEF COMPLAINT:  Chest pain.   HISTORY OF PRESENT ILLNESS:  Patient is a 55 year old African American  female with a past medical history notable for mixed connective tissue  disease who presents for evaluation of chest pain.  The patient states  that the pain began yesterday while at work.  She states she was lifting  some heavy boxes when the pain onset.  She endorses associated shortness  of breath and dyspnea, but denies any nausea, vomiting, diaphoresis or  palpitations.  She characterizes this discomfort as sharp and substernal  in location.  She denies any palpable component, but does note that it  is worse with deep inspiration and cough.  The patient denies any  fevers; however, on presentation this evening, she does have a low grade  temp.  She denies any recent sick contacts.  Her initial cardiac markers  are negative x1.  The EKG reveals normal sinus rhythm with no evidence  of acute injury or ischemia and, otherwise, no changes compared with  previous study.   PAST MEDICAL HISTORY:  1. Mixed connective tissue disease, for which she takes Plaquenil      daily.  2. Childhood epilepsy.  3. History of neck surgery approximately 15 years ago.   ALLERGIES:  PENICILLIN.   MEDICATIONS:  Plaquenil 200 mg once daily.   SOCIAL HISTORY:  Patient lives in [place] with her 3 sons.  The  company she works for is in [place] and it is [employer].  She  has a history of smoking and is currently using 1 pack per day.  She has  occasional alcohol intake, but denies any illicit  substances.   FAMILY HISTORY:  Notable for a mother who died secondary to  complications of lupus and rheumatoid arthritis.  Her father is alive  and well, but does have coronary disease and is status post percutaneous  coronary interventions.  She has 4 siblings who are all alive and well  with no major medical problems.   REVIEW OF SYSTEMS:  CONSTITUTIONAL:  No fevers, chills, sweats or  retinopathy.  HEENT:  No headaches, sore throat, nasal discharge, change  in visual or auditory acuity.  SKIN:  No rashes or lesions.  CARDIAC/PULMONARY:  As per HPI.  GU:  No frequency, urgency or dysuria.  NEURO/PSYCH:  No weakness, numbness or mood disturbance.  MUSCULOSKELETAL:  No myalgias, arthralgias, joint swelling or deformity.  GI:  No nausea, vomiting, diarrhea, bright red blood per rectum, melena,  hematemesis, dysphagia or GERD symptoms.  ENDOCRINE:  No polyuria.  No  polydipsia.  No heat or cold intolerance.   PHYSICAL EXAMINATION:  VITAL SIGNS:  Blood pressure  is 121/84.  Pulse is  95.  O2 saturations are 98% on room air.  Temperature is 100.1.  GENERAL:  The patient is lethargic, but easily aroused after receiving  Ativan.  HEENT:  Normocephalic, atraumatic.  EOMI.  PERRL.  Nares patent.  OMP is  clear without erythema or exudate.  NECK:  Supple.  Full range of motion.  No JVD.  There is no palpable  thyromegaly.  LYMPHADENOPATHY:  There is a shotty  lymphadenopathy palpable in her  neck region.  CARDIOVASCULAR:  Normal S1 and S2 without audible murmurs, rubs or  gallops.  Her peripheral pulses are 2+ and symmetric bilaterally.  Carotid upstrokes are equal and symmetric bilaterally without audible  bruit.  LUNGS:  Clear to auscultation bilaterally.  SKIN:  Reveals no rashes or lesions.  ABDOMEN:  Soft, nontender, nondistended.  Positive bowel sounds.  No  hepatosplenomegaly.  GU:  Normal female genitalia.  EXTREMITIES:  No clubbing, cyanosis or edema.  MUSCULOSKELETAL:  No  joint deformity or effusions.  NEUROLOGIC:  Strength and sensation grossly intact throughout.  Otherwise, exam is not focal.   Chest x-ray:  No acute cardiopulmonary process.  CT:  No evidence of  pulmonary embolism; however, there was mention of right hilar adenopathy  and scattered small axillary and subpectoral lymph nodes noted.  She did  have changes consistent with emphysema as well.   EKG:  Normal sinus rhythm with no evidence of acute injury or ischemia.  There is no changes compared with previous examination.   IMPRESSION:  1. Chest pain.  Rule out myocardial infarction.  2. Mixed connective tissue disease.  3. Lymphadenopathy noted on chest CT.   PLAN:  From a cardiovascular standpoint, [name] is hemodynamically  stable and the chest pain has improved significantly.  The features of  her discomfort are somewhat atypical for cardiac origin and, therefore,  I feel acute coronary syndrome is very unlikely.  Her initial cardiac  markers are negative x1.  Her EKG is unremarkable for any acute injury  or ischemia.  The patient will be admitted for further observation and  to rule out for myocardial infarction.  Consideration could be given to  noninvasive imaging for further risk stratification if clinically  indicated.  With regards to her chest CT, there is no evidence of  pulmonary embolism; however, it is somewhat concerning that she has  hilar and subpectoral  lymphadenopathy and a low grade fever.  This may  be a manifestation of a nonspecific viral illness or perhaps related to  her mixed connective tissue disease; however, close followup is  warranted.      Debra Amel, MD  Electronically Signed     SHG/MEDQ  D:  11/06/2006  T:  11/07/2006  Job:  [id number]

## 2011-03-15 NOTE — Assessment & Plan Note (Signed)
[facility]                        [facility] OFFICE NOTE   NAME:Debra [name], Debra [name]                      MRN:          [id number]  DATE:11/12/2006                            DOB:          12-27-1955    CHIEF COMPLAINT:  The patient here for hospital followup and refill of  Lexapro.  The patient was in [facility] on January 10 and  January 11 with atypical chest pain.  A PE and MI were ruled out.  The  patient did have a CAT scan of the chest while she was in the hospital,  which showed right hilar adenopathy and scattered axillary subpectoral  lymph notes, and a hypodense lesion on the liver on CT.  She is a 55-  year-old female who was admitted on November 10 with chest pain.  She  had complained of a 1 day history of pain while at work, while she was  lifting.  It was worse with deep inspiration and cough.  She was  admitted on that date for further evaluation.  The patient states the  pain has subsided, but she is concerned because she does not know why it  happened to begin with and is very concerned about the abnormality found  on the CAT scan.   PAST MEDICAL HISTORY:  Cervical neck surgery in 2006, decompression of  carpal tunnel on the left wrist in February 2006 and the right side on  January 2006.  She has a history of mixed connective tissue disease and  she is followed by Dr. Jimmy Footman, and she also has a history of  hypothyroidism.   MEDICATIONS:  1. She is on Synthroid 0.075 mg a day.  2. Prednisone 5 mg 1-and-a-half a day.  3. Plaquenil twice a day.  4. She had run out of her Lexapro, but was taking 10 mg a day.  5. Multivitamin.  6. Fish oil.  7. Tramadol for pain.   SOCIAL HISTORY:  The patient does smoke a half a pack a day or more for  several years, and drinks occasionally.   PAST FAMILY HISTORY:  Mother passed away from breast cancer.  She also  has a maternal aunt with breast cancer and diabetes.  Her  father has  hypertension and heart disease.  Her mother also had rheumatoid  arthritis and depression.   PHYSICAL EXAM:  Weight is 146.4, pulse 72, blood pressure 124/78.  The patient is awake, alert, and oriented, in no acute distress.  HEART:  Positive S1, S2.  No murmurs appreciated.  LUNGS:  Clear bilaterally.  No rhonchi, rales, or wheezing.  ABDOMEN:  Soft and nontender.  PSYCH:  The patient is extremely anxious today and is requesting to go  back on her Lexapro.   LABS:  X-ray results from the hospital admission were reviewed with the  patient.   ASSESSMENT AND PLAN:  1. Right hilar adenopathy and scattered axillary and subpectoral lymph      nodes on CT.  We will refer the patient to pulmonary for further      evaluation.  The patient is asymptomatic at  this time.  2. Atypical chest pain.  The patient is set up for a Cardiolite stress      test this Friday.  She was instructed to go to the emergency room      with any further pains or shortness of breath.  3. Anxiety.  Probably secondary to recent admission.  Will start the      patient back on her Lexapro 10 mg a day.  She does have Klonopin,      but she will hold off on starting that at this time.  4. TOB abuse-- Discussed with patient the need to stop smoking.  I      offered Chantix to the patient, but she wishes to wait at this      time.     Lelon Perla, DO  Electronically Signed    Shawnie Dapper  DD: 11/12/2006  DT: 11/12/2006  Job #: [id number]
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
## 2011-03-15 NOTE — Assessment & Plan Note (Signed)
[facility]                             PULMONARY OFFICE NOTE   NAME:Debra [name], Debra [name]                      MRN:          [id number]  DATE:12/25/2006                            DOB:          1955-11-04    This is a 55 year old female who presented to the office as a follow up  from the hospital; however, we have never evaluated this patient  previously.  Subsequent investigation revealed that the patient was  actually in the office not for a follow up but for a pulmonary  consultation and that the patient was to be evaluated for right hilar  adenopathy.   The patient apparently had been admitted to [facility] on the  10th of January, 2008, subsequently discharged on the 11th, due to  atypical chest pain.  A CT scan of the chest was obtained to rule out a  PE and incidental right hilar adenopathy and scattered axillary  subpectoral lymph nodes were noted on CT.  The right hilar adenopathy  was believed to be either reactive vs. Potential malignancy but,  however, the characteristics were indefinite in nature.  The patient is  a former smoker, having quit 3 weeks ago.  She denies any dyspnea, has  had no chest pain since her admission in January, denies any cough,  hemoptysis or sputum production.  In addition, she denies any  constitutional symptoms of weight loss.  She has had, however, increased  difficulties with gastroesophageal reflux.  She has discontinued her  medication that was given to her at the hospital; she is uncertain of  what this is, but thought it was Protonix.  She is uncertain why she  stopped the medication.  She believe she had some sort of side effect  but she cannot tell me.   REVIEW OF SYSTEMS:  We did a thorough review of systems.  She had marked  almost every single item in her sheet positive, but when further delved  the most salient symptoms are as noted above.   PAST MEDICAL HISTORY:  Remarkable for:  1.  Asthma.  2. Patient also is noted to have hyperlipidemia.  3. Issues with seasonal allergies.  4. Chronic headaches.  5. History of back surgery.  6. History of hypothyroidism.  7. History of mixed connective tissue disease.   ALLERGIES:  1. PENICILLIN.  2. ASPIRIN.   CURRENT MEDICATIONS:  1. Synthroid 0.075 mg daily.  2. Prednisone 5 mg daily.  3. Tramadol, undisclosed amount daily.  4. Plaquenil, also undisclosed amount twice a day.   SOCIAL HISTORY:  1. She smoked 1 pack of cigarettes per day for 15 years.  She quit      smoking approximately 3 weeks ago  2. She has a rare alcoholic beverage.  3. She is separated.  4. She has 3 children that live with her.  5. 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  6. She has no military nor occupational exposure.  7. She has had no exotic travel.   FAMILY HISTORY:  Noncontributory.   PHYSICAL EXAM:  Blood pressure was  104/68, pulse was 100, oxygen  saturation 99% on room air, weight of 149 pounds, temperature 98.4.  GENERAL:  This is a well-developed, well-nourished female who is in no  acute distress.  HEENT:  Unremarkable.  NECK:  Supple.  No adenopathy noted, no JVD.  LUNGS:  Clear to auscultation bilaterally.  CARDIAC:  Regular rate, rhythm.  No rub, murmurs or gallops heard.  EXTREMITIES:  Patient has no cyanosis, no clubbing, no edema noted.  NEUROLOGIC:  Grossly nonfocal.   We did review the patient's CT scan.  She does indeed have an  approximately 2 cm or less right hilar node, she also has some shotty  adenopathy elsewhere.  None of these are amenable for diagnosis by  bronchoscopy.   IMPRESSION:  1. Hilar adenopathy of uncertain etiology due to the indeterminate      size of the nodes, none of which are amenable to diagnosis by      bronchoscopy without use of subspecialized procedure such as      endobronchial ultrasound.  2. Tobacco abuse and dependence, now in remission.  3. Gastroesophageal reflux with poor  control of the same.   PLAN:  1. To obtain a PET scan to better delineate the nature of the      patient's adenopathy.  This will be obtained on the 5th of March at      9 a.m. at [facility].  2. The patient will be placed on Aciphex 20 mg twice a day to see if      this controls her symptoms.  3. Followup will be in 2-3 weeks' time.  She is to contact us prior to      that time should any new problems arise.     Gailen Shelter, MD  Electronically Signed    CLG/MedQ  DD: 12/25/2006  DT: 12/25/2006  Job #: [id number]   cc:   Lelon Perla, DO

## 2011-03-15 NOTE — Op Note (Signed)
[name], [name]                         ACCOUNT NO.:  [id number]   MEDICAL RECORD NO.:  [id number]                   PATIENT TYPE:  OBV   LOCATION:  0445                                 FACILITY:  [facility]   PHYSICIAN:  Georges Lynch. Darrelyn Hillock, M.D.             DATE OF BIRTH:  Feb 15, 1956   DATE OF PROCEDURE:  05/11/2003  DATE OF DISCHARGE:                                 OPERATIVE REPORT   SURGEON:  Georges Lynch. Darrelyn Hillock, M.D.   ASSISTANT:  Ronnell Guadalajara, M.D.   PREOPERATIVE DIAGNOSES:  1. Large herniated lumbar disk at L4-5, laterally in the foramen.  2. Partial foot drop, preoperatively.  3. Lateral recess stenosis, L4-5, left.   POSTOPERATIVE DIAGNOSES:  1. Large herniated lumbar disk at L4-5, laterally in the foramen.  2. Partial foot drop, preoperatively.  3. Lateral recess stenosis, L4-5, left.   OPERATION/PROCEDURE:  1. Decompression lateral recess, L4-5 on the left.  2. Hemilaminectomy and microdissection at L4-5 on the left.   DESCRIPTION OF PROCEDURE:  The patient was given 1 g of IV Ancef.  Sterile  prep and draping of the lower back was carried out.  Two needles were placed  in the back for localization purposes.  X-rays were taken in the __________  positions.  Following this, I then went on and I made a incision over the L4-  5 interspace.  Bleeders were identified and cauterized.  Self-retaining  retractors were inserted.  The incision was carried down through to the  spinous processes of L4-L5 region and down to the lamina.  Another x-ray was  taken to verify the position.  I then went down and separated muscle from  the lamina. The bur was used to thin out the lamina at L4-5.  Then this was  done with the [facility] retractors in place.  I thoroughly irrigated off  the area and then I carried my hemilaminectomy in the usual fashion.  The  recesses were quite tight so I utilized the bur to go out laterally as well  and we then did a partial facetectomy, cleaned  out the lateral recess,  cauterized all the lateral recessed veins after we identified to nerve root.  We cauterized the veins with the bipolar.  We then identified the L4-5 disk  on the left.  Cruciate incision was made at the disk space and a large  amount of disk material extruded.  We then out with the nerve hook as well  as the Epstein curets and out into the foramen, cleaned the foraminal disk  material out as well and then went back into the space on multiple passes.  We were now easily able to free to root up.  We had good motion of the root.  There was no tension on the root in the foramen.  As I mentioned we made  multiple passes in the disk space to clean out the space.  Thoroughly  irrigated out the area.  Hemostasis was then maintained with Gelfoam that  was lightly applied to the area.  The wound was closed in layers using  __________ the deep superior part of the wound.  A portion was left open for  drainage purposes to avoid any compression on the dura.  The subcu was  closed with 0 Vicryl.  Skin was closed with metal staples.  Sterile  Neosporin dressing was applied.   Note:  She was a difficult intubation preoperatively.  The anesthesiologist  had to use a fiberoptic intubation because of her previous cervical fusion.  She had no problems during the surgical procedure.   The patient left the operating room in satisfactory condition.                                               Ronald A. Darrelyn Hillock, M.D.    RAG/MEDQ  D:  05/11/2003  T:  05/11/2003  Job:  [id number]

## 2011-03-15 NOTE — Op Note (Signed)
[facility]  Patient:    Debra [name], Debra [name]                      MRN: [id number] Adm. Date:  [id number] Attending:  Leonard Schwartz                           Operative Report  PREOPERATIVE DIAGNOSES:       1. 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                               2. Inadequate colposcopy with a transition zone                                  that could not be seen.                               3. 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  POSTOPERATIVE DIAGNOSES:      1. 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                               2. Inadequate colposcopy with a transition zone                                  that could not be seen.                               3. 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  PROCEDURE:                    Loop electrosurgical excision procedure.  SURGEON:                      Janine Limbo, M.D.  ANESTHESIA:                   Local.  DISPOSITION:                  The patient is a 55 year old with the above mentioned diagnoses.  She understands the indications for her procedure and she accepts the associated risks.  FINDINGS:                     The cervix appeared normal.  PROCEDURE:                    The patient was taken to the operating room where she was placed in a lithotomy position.  The patient had previously voided.  The vagina was prepped with multiple layers of acetic acid.  The cervix was injected  with 20 cc of a diluted solution of Pitressin, saline, and lidocaine.  A conization specimen was obtained using the LEEP machine without difficulty.  Hemostasis was adequate.  The patient was returned to the supine position and then taken to the recovery room in stable condition. DD:  08/09/00 TD:  08/10/00 Job: [id number]

## 2011-03-15 NOTE — Op Note (Signed)
Debra [name], Debra [name]               ACCOUNT NO.:  [id number]   MEDICAL RECORD NO.:  [id number]          PATIENT TYPE:  OIB   LOCATION:  3007                         FACILITY:  [facility]   PHYSICIAN:  Hilda Lias, M.D.   DATE OF BIRTH:  June 23, 1956   DATE OF PROCEDURE:  05/22/2005  DATE OF DISCHARGE:                                 OPERATIVE REPORT   PREOPERATIVE DIAGNOSES:  1.  Cervical 6, cervical 7 herniated disk.  2.  Status post fusion at 4-5 and 5-6 and, posterior for C1, C3.   POSTOPERATIVE DIAGNOSES:  1.  Cervical 6, cervical 7 herniated disk.  2.  Status post fusion at 4-5 and 5-6, and posterior for C1, C3.   OPERATION PERFORMED:  1.  Video cervical 6, cervical 7 diskectomy.  2.  Decompression of the spinal cord and cervical 7 nerve root.  3.  Interbody fusion with allograft.  4.  Microscopic surgery.   SURGEON:  Hilda Lias, M.D.   ASSISTANT:  Danae Orleans. Venetia Maxon, M.D.   ANESTHESIA:   BRIEF HISTORY:  The patient was admitted because of neck pain with radiation  to both upper extremity right worse than left.  Previously this lady had a  fusion by another neurosurgeon at the level of 4-5 and 5-6, and a fusion  posteriorly at the level of C1, C2 and C3 in [place].  A myelogram  shows that she has a herniated disk at the level of C6 and C7 extending to  the right.  Surgery was advised.  The patient knew about the risks, which  were explained in the history and physical.  The patient also knew that we  would attempt to remove the old plate and put in a new plate, but that was  not the case.  The fusion would be done without any hardware.   DESCRIPTION OF OPERATION:  The patient was brought to the OR and she was  positioned supine.  The neck was prepped with Betadine.  We knew by ENT that  the vocal cords were normal.  So, to prevent making a second incision we  used the one on the left where she had surgery for her thyroid.  This  incision was carried down through  skin and subcutaneous tissue.  Retraction  was done.  It was difficult to see the plate because there was bone  overgrown over the plate.  Finally, we drilled with __________  plate and  immediately the disk was right there.  There was no space between C6 to put  a new plate.  Because of the calcification of the plate we decided to leave  the plate intact.   We opened the anterior ligament and with the microscope we did a total gross  diskectomy.  We opened the posterior ligament and, indeed, there was quite a  bit of herniated disk, semirigid on the right side.  Decompression of the  7th nerve root as well as the spinal cord was done. Then we drilled the  endplate and an allograft of 7 mm in height with  BMX inside, plus the patient's  own bone  was introduced .  Hemostasis was  done, but nevertheless since this was the third surgery on her neck  anteriorly we left in a drain.   The wound was closed with Vicryl and Steri-strips.       EB/MEDQ  D:  05/22/2005  T:  05/23/2005  Job:  [id number]

## 2011-03-15 NOTE — Discharge Summary (Signed)
NAMEELDA, DUNKERSON               ACCOUNT NO.:  [id number]   MEDICAL RECORD NO.:  [id number]          PATIENT TYPE:  INP   LOCATION:  4707                         FACILITY:  [facility]   PHYSICIAN:  Sean A. Everardo All, MD    DATE OF BIRTH:  Mar 13, 1956   DATE OF ADMISSION:  11/06/2006  DATE OF DISCHARGE:  11/07/2006                               DISCHARGE SUMMARY   DISCHARGE DIAGNOSES:  1. Atypical chest pain.  2. Right hilar adenopathy and scattered axillary subpectoral lymph      nodes noted on CT.  3. Hypodense lesions noted in the liver on CT.   HISTORY OF PRESENT ILLNESS:  Ms. Dehaas is a 55 year old female who was  admitted on September 06, 2006, with a chief complaint of chest pain..  On admission  she described developing chest pain on the day prior to  admission while at work.  This occurred while she was lifting.  She  noted that the chest pain was worse with deep inspiration and with  cough.  She was admitted for further evaluation and treatment.   PAST MEDICAL HISTORY:  1. Cervical neck surgery, 2006.  2. Decompression of carpal tunnel:  Left side, February 2006.  And,      right side, January 2006.  3. Mixed connective tissue disease.  4. History of hypothyroid.   COURSE OF HOSPITALIZATION:  Atypical chest pain.  The patient was  admitted.  She underwent serial cardiac enzymes which were negative.  She maintained normal sinus rhythm on telemetry.  Her chest pain has  resolved.  She was noted to have an elevated D-dimer with a value of  0.85.  A CT angio of the chest was negative for PE.  However, it was  noted that the patient had a right hilar adenopathy and scattered small  axillary and subpectoral left nodes.  It was felt by radiology that  these findings were nonspecific but could possibly be reactive or due to  malignancy such as lymphoma.  They recommended careful followup.  In  addition, some hypodense lesions were noted in the liver which were  thought to  statistically be benign.  A chest x-ray showed no evidence of  pneumonia.  No active cardiopulmonary disease. The patient's temperature  on admission was 100.1.  She has had no further low grade temp and  reports resolution of her chest pain.   We have arranged followup on November 12, 2006, with Dr. Loreen Freud.  We have also asked cardiology to assist in setting up an outpatient  Cardiolite.   PERTINENT LABORATORY DATA:  At time of discharge, cardiac enzymes  negative x4.  D-dimer 0.85.  Hemoglobin 11.4, hematocrit 33.8, platelets  246.  BUN 6, creatinine 0.68.   MEDICATIONS:  At time of discharge, the patient is to continue all  medications as prior to discharge which per medical record  reconciliation sheet are tramadol, Plaquenil, prednisone, Synthroid, and  Allegra.  These medications are to be continued at the same doses as  prior to admission.   She is to follow up with Dr. Loreen Freud on November 12, 2006, at 12:15  p.m. She is instructed to call Dr. Laury Axon, should she develop weakness,  shortness of breath, fever over 101, cough, recurrent chest pain, or to  go directly to the emergency room __________ .      Sandford Craze, NP      Gregary Signs A. Everardo All, MD  Electronically Signed    MO/MEDQ  D:  11/07/2006  T:  11/07/2006  Job:  [id number]   cc:   Loreen Freud, M.D.

## 2011-03-15 NOTE — Assessment & Plan Note (Signed)
[facility]                            CARDIOLOGY OFFICE NOTE   NAME:[name], [name]                      MRN:          [id number]  DATE:01/06/2007                            DOB:          18-Oct-1956    REASON FOR CONSULTATION:  Chest pain and palpitations.   HISTORY OF PRESENT ILLNESS:  Debra [name] is a 55 year old woman with a  history of mixed connective tissue disorder, tobacco use and  hypothyroidism.  She is referred today by Dr. Laury Axon for further  evaluation of chest pain and palpitations.  She was admitted in January  of 2008 with atypical chest pain, had a CT scan of the chest which was  negative for pulmonary embolus; however, there was some evidence of  right hilar adenopathy and scattered lymphadenopathy.  She has been  referred to pulmonary for further evaluation.  During that  hospitalization, she ruled out for myocardial infarction with serial  cardiac markers.  She was scheduled for an outpatient nuclear study  which was performed on November 14, 2006. This showed an EF of 61%, no  diagnostic perfusion abnormalities, although there was a small fixed  distal anterior defect, which was thought to be breast attenuation.   Since that time, she continues to have episodes of vague chest pain;  these happen every day.  She feels it is more like a soreness and may be  related to her muscles.  She is fairly active, working as a Engineer, site and says moving around does not make the pain worse.  She denies  any significant shortness of breath.  She also notes that her heart  feels like it is occasionally racing when she is active but can also  occur when she is sitting down, depending on her stress level. She  occasionally feels lightheaded but denies any syncopal episodes. She  also feels like she has episodes of hyperventilation.   REVIEW OF SYSTEMS:  Diffusely positive, including Raynaud's phenomenon.  Also notes occasional abdominal pain  and reflux disease.  She has  chronic muscle pain and joint pain, anxiety, as well as dental problems,  nasal congestion, depression.  She denies any orthopnea, PND, or lower  extremity edema.  Remaining review of systems is negative, except for  HPI and problem list.   PROBLEM LIST:  1. History of mixed connective tissue disorder with associated      Raynaud's phenomenon.  2. History of tobacco abuse, quit in January 2008.  3. Atypical chest pain in January 2008.      a.     CT scan of the chest negative for pulmonary embolism, but       did show a hilar adenopathy.      b.     Negative Myoview in January of 2008.  4. Hypothyroidism.  5. Cervical neck surgery in 2006.   CURRENT MEDICATIONS:  1. Synthroid 75 mcg a day.  2. Tramadol.  3. Plaquenil b.i.d.  4. AcipHex 20 a day.  5. Fish oil.  6. Multivitamins.   ALLERGIES:  PENICILLIN.   SOCIAL HISTORY:  She lives  in [place] with her 2 sons. She is  separated. She works as a Electrical engineer, has a history of tobacco 1  pack a day for many years, quit in January.  Denies any significant  alcohol use.   FAMILY HISTORY:  Mother died at age 54; she had a history of rheumatoid  arthritis, and she also thinks she had a heart condition but is unsure.  Father is alive at 60; he has had a history of stroke and she is unsure  if he has coronary disease.   PHYSICAL EXAMINATION:  She is well-appearing, in no acute distress,  ambulates around the clinic without any respiratory difficultly. Blood  pressure is 110/76.  Her pulse is 74.  HEENT:  Sclerae anicteric.  EOMI.  There is no xanthelasma.  Mucous  membranes are moist.  Oropharynx is clear.  NECK:  Supple.  No JVD.  Carotids are 2 + bilaterally without any  bruits.  There is no lymphadenopathy or thyromegaly.  CARDIAC:  Regular rate and rhythm.  No murmurs, rubs, or gallops.  LUNGS:  Clear.  ABDOMEN:  Soft, nontender, nondistended.  No hepatosplenomegaly, no  bruits, no masses.   Good bowel sounds.  EXTREMITIES:  Warm with no cyanosis, clubbing, or edema. There are no  rashes or arthropathies.  NEURO:  She is alert and oriented x3.  Cranial nerves II-XII are intact.  Moves all 4 extremities without difficultly. Affect is a bit eccentric.   EKG shows a normal sinus rhythm at a rate of 74, with no ST-T wave  abnormalities.   ASSESSMENT/PLAN:  1. Chest pain.  I suspect this is noncardiac, and I do not believe we      need to work this up further.  2. Palpitations and dizziness. We will place a 40 hour Holter monitor      on her to further evaluate and get a 2D echocardiogram to make sure      she has a structurally normal heart.  I suspect these symptoms are      probably related to her anxiety and will turn out benign.   DISPOSITION:  We will await the results of her tests.  Should these be  normal, she can follow up with [place] just on a p.r.n. basis.     Bevelyn Buckles. Bensimhon, MD  Electronically Signed   DRB/MedQ  DD: 01/06/2007  DT: 01/08/2007  Job #: [id number]   cc:   Lelon Perla, DO

## 2011-03-15 NOTE — Assessment & Plan Note (Signed)
[facility]                             PULMONARY OFFICE NOTE   NAME:Debra [name], Debra [name]                      MRN:          [id number]  DATE:02/20/2007                            DOB:          1956/04/03    This is a very pleasant 55 year old African-American female who follows  here for mediastinal adenopathy.  We first evaluated her on the 20th of  February, please refer to the details of that visit previously dictated.   The patient underwent PET CT to evaluate her mediastinal adenopathy and  this was entirely negative.  She had no areas that were suspicious.  She  has been having some difficulties with chest discomfort and this is  better with Aciphex twice a day;this is likely related to reflux.  The  patient has a known history of connective tissue disease.  She had some  concerns with regards to this and I recommended that she discuss this  with her rheumatologist.  I suspect that her low grade mediastinal  adenopathy noted on CT is related to her mixed connective tissue  disease.   CURRENT MEDICATIONS:  Are as noted on the intake sheet.  These have been  reviewed and are accurate.   PHYSICAL EXAM:  VITALS:  Are as noted.  Oxygen saturation is 98% on room  air.  GENERAL:  This is a well-developed, well-nourished African-American  female who is in no acute distress.  HEENT EXAMINATION:  Unremarkable.  NECK:  Supple.  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  CARDIAC EXAMINATION:  Regular rate, rhythm.  No rubs, murmurs or gallops  heard.  EXTREMITIES:  The patient has no cyanosis, no clubbing, no edema noted.   We did review the results of a PET scan done on the 5th of March, 2008.  This was entirely negative with no areas of increased uptake.   IMPRESSION:  1. 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  2. Gastroesophageal  reflux.   PLAN:  1. For the patient to continue Aciphex 20 mg twice a day.  2. Continue management of her mixed connective tissue disease as      directed by her rheumatologist.  3. She will require followup in 3 months' time x2 and then      subsequently after that every 6 months x2 and then yearly after      that to monitor her mediastinal process.  Plan therefore will be to      have her set up an appointment with Dr. Levy Pupa in 3 months'      time, with a chest      x-ray at that time.  She will establish with Dr. Delton Coombes, as I will      be leaving the practice.  Patient is to contact us prior to that      time should any new problems arise.     Gailen Shelter, MD  Electronically Signed    CLG/MedQ  DD:  02/20/2007  DT: 02/20/2007  Job #: [id number]   cc:   Lelon Perla, DO

## 2011-03-15 NOTE — Op Note (Signed)
[name], [name]               ACCOUNT NO.:  [id number]   MEDICAL RECORD NO.:  [id number]          PATIENT TYPE:  AMB   LOCATION:  DSC                          FACILITY:  [facility]   PHYSICIAN:  Angelia Mould. Derrell Lolling, M.D.DATE OF BIRTH:  1956-08-16   DATE OF PROCEDURE:  12/26/2004  DATE OF DISCHARGE:                                 OPERATIVE REPORT   PREOPERATIVE DIAGNOSIS:  Myositis.   POSTOPERATIVE DIAGNOSIS:  Myositis.   OPERATION PERFORMED:  Left thigh quadriceps muscle biopsy.   SURGEON:  Angelia Mould. Derrell Lolling, MD   OPERATIVE INDICATIONS:  This is a 55 year old female who has a 60-month  history of swelling in her hands and knees and feet with painful joints and  some motor weakness and she was found to have elevated CPK.  She is been on  prednisone for over 1 month.  She was sent to me by Dr. Syliva Overman for a  muscle biopsy to clarify her diagnosis.   OPERATIVE TECHNIQUE:  The patient was brought to operating room and placed  supine on the operating table.  She was monitored and sedated by anesthesia  department.  The left thigh was prepped and draped in sterile fashion.  Xylocaine 1% with epinephrine was used as a local-infiltration anesthetic.  A longitudinal incision was made in the very proximal left thigh lateral to  the midline of the leg.  Dissection was carried down through the  subcutaneous tissue.  We encountered a sensory nerve on top of the fascia  which we were able to avoid.  We went more lateral and incised the muscle  fascia.  Self-retaining retractors were placed.  We dissected out some of  the quadriceps muscle.  We isolated 3 separate segments of quadriceps  muscle, each about 8 mm in width and about 2.5 centimeters in length.  These  were clamped proximally and distally and the muscle excised.  The muscle was  affixed to a tongue blade using a stapler and placed on moistened saline  gauze.  After all 3 of these specimens were obtained, they were wrapped up  in a saline-moistened gauze and sent immediately to the lab.  I spoke with  Dr. Kieth Brightly to let her know the specimen was coming and she will  process the specimen appropriately.  The ends of the muscle were tied off  with 2-0 Vicryl ties.  Hemostasis was excellent.  The wound was irrigated  with saline.  Subcutaneous tissue was  closed with interrupted sutures of 3-0 Vicryl.  The skin was closed with  interrupted sutures of 3-0 nylon.  A clean bandage was placed and the  patient taken the recovery room in stable condition.  Estimated blood loss  was about 5 mL.  Complications -- none.  Sponge, needle and instrument  counts were correct.      HMI/MEDQ  D:  12/26/2004  T:  12/26/2004  Job:  [id number]   cc:   Lemmie Evens, M.D.  [address]  [place]  [place] [address]  Fax: [phone]   Idamae Schuller A. Milinda Antis, M.D. [facility]

## 2011-03-28 ENCOUNTER — Other Ambulatory Visit: Payer: Self-pay | Admitting: Family Medicine

## 2011-03-28 NOTE — Telephone Encounter (Signed)
Last seen 01/03/11 and filled 01/03/11 please advise    KP

## 2011-05-10 ENCOUNTER — Ambulatory Visit (INDEPENDENT_AMBULATORY_CARE_PROVIDER_SITE_OTHER): Payer: PRIVATE HEALTH INSURANCE | Admitting: Family Medicine

## 2011-05-10 ENCOUNTER — Encounter: Payer: Self-pay | Admitting: Family Medicine

## 2011-05-10 ENCOUNTER — Ambulatory Visit (HOSPITAL_BASED_OUTPATIENT_CLINIC_OR_DEPARTMENT_OTHER)
Admission: RE | Admit: 2011-05-10 | Discharge: 2011-05-10 | Disposition: A | Discharge status: Home / Self Care | Payer: PRIVATE HEALTH INSURANCE | Source: Ambulatory Visit | Attending: Family Medicine | Admitting: Family Medicine

## 2011-05-10 VITALS — BP 126/84 | HR 63 | Temp 98.1°F | Wt 168.8 lb

## 2011-05-10 DIAGNOSIS — Z720 Tobacco use: Secondary | ICD-10-CM

## 2011-05-10 DIAGNOSIS — M25561 Pain in right knee: Secondary | ICD-10-CM

## 2011-05-10 DIAGNOSIS — F17200 Nicotine dependence, unspecified, uncomplicated: Secondary | ICD-10-CM

## 2011-05-10 DIAGNOSIS — M25569 Pain in unspecified knee: Secondary | ICD-10-CM

## 2011-05-10 DIAGNOSIS — M949 Disorder of cartilage, unspecified: Secondary | ICD-10-CM | POA: Insufficient documentation

## 2011-05-10 DIAGNOSIS — IMO0002 Reserved for concepts with insufficient information to code with codable children: Secondary | ICD-10-CM

## 2011-05-10 DIAGNOSIS — M1710 Unilateral primary osteoarthritis, unspecified knee: Secondary | ICD-10-CM

## 2011-05-10 DIAGNOSIS — G8929 Other chronic pain: Secondary | ICD-10-CM

## 2011-05-10 DIAGNOSIS — M899 Disorder of bone, unspecified: Secondary | ICD-10-CM | POA: Insufficient documentation

## 2011-05-10 MED ORDER — GABAPENTIN 600 MG PO TABS: 600.0000 mg | ORAL_TABLET | Freq: Three times a day (TID) | ORAL | Status: DC

## 2011-05-10 MED ORDER — VARENICLINE TARTRATE 0.5 MG X 11 & 1 MG X 42 PO MISC: ORAL | Status: AC

## 2011-05-10 MED ORDER — TRAMADOL HCL 50 MG PO TABS: ORAL_TABLET | ORAL | Status: DC

## 2011-05-10 NOTE — Progress Notes (Signed)
  Subjective:    Patient ID: Debra [name], female    DOB: Oct 16, 1956, 55 y.o.   MRN: [id number]  HPI Pt here c/o R knee and leg pain for 3 months.  She thinks she may have injured it at the gym. Pain is mostly with walking up and down steps.   Review of Systems as above   Objective:   Physical Exam  Constitutional: She appears well-developed and well-nourished.  Musculoskeletal: She exhibits tenderness. She exhibits no edema.       Pain with full ext of R knee No pain with palpation of knee  No calf pain Pain with weight bearing   Psychiatric: She has a normal mood and affect. Her behavior is normal. Judgment and thought content normal.          Assessment & Plan:

## 2011-05-10 NOTE — Assessment & Plan Note (Signed)
Check xray prob DJD Knee sleeve Consider ortho

## 2011-05-10 NOTE — Patient Instructions (Signed)
Knee Pain, General The knee is the complex joint between your thigh and your lower leg. It is made up of bones, tendons, ligaments, and cartilage. The bones that make up the knee are:  The femur in the thigh.   The tibia and fibula in the lower leg.   The patella or kneecap riding in the groove on the lower femur.  CAUSES Knee pain is a common complaint with many causes.  A few of these causes are:  Injury, such as:   A ruptured ligament or tendon injury.   Torn cartilage.    Medical conditions, such as:   Gout   Arthritis   Infections   Overuse, over training or overdoing a physical activity.  Knee pain can be minor or severe. Knee pain can accompany debilitating injury. Minor knee problems often respond well to self-care measures or get well on their own. More serious injuries may need medical intervention or even surgery. Symptoms The knee is complex. Symptoms of knee problems can vary widely. Some of the problems are:  Pain with movement and weight bearing.  Swelling and tenderness.   Buckling of the knee.   Inability to straighten or extend your knee.   Your knee locks and you cannot straighten it.  Warmth and redness with pain and fever.   Deformity or dislocation of the kneecap.   DIAGNOSIS Determining what is wrong may be very straight forward such as when there is an injury. It can also be challenging because of the complexity of the knee. Tests to make a diagnosis may include:  Your caregiver taking a history and doing a physical exam.   Routine X-rays can be used to rule out other problems. X-rays will not reveal a cartilage tear. Some injuries of the knee can be diagnosed by:   Arthroscopy a surgical technique by which a small video camera is inserted through tiny incisions on the sides of the knee. This procedure is used to examine and repair internal knee joint problems. Tiny instruments can be used during arthroscopy to repair the torn knee cartilage  (meniscus).   Arthrography is a radiology technique. A contrast liquid is directly injected into the knee joint. Internal structures of the knee joint then become visible on X-ray film.   An MRI scan is a non x-ray radiology procedure in which magnetic fields and a computer produce two- or three-dimensional images of the inside of the knee. Cartilage tears are often visible using an MRI scanner. MRI scans have largely replaced arthrography in diagnosing cartilage tears of the knee.   Blood work.   Examination of the fluid that helps to lubricate the knee joint (synovial fluid). This is done by taking a sample out using a needle and a syringe.  treatment The treatment of knee problems depends on the cause. Some of these treatments are:  Depending on the injury, proper casting, splinting, surgery or physical therapy care will be needed.   Give yourself adequate recovery time. Do not overuse your joints. If you begin to get sore during workout routines, back off. Slow down or do fewer repetitions.   For repetitive activities such as cycling or running, maintain your strength and nutrition.   Alternate muscle groups. For example if you are a weight lifter, work the upper body on one day and the lower body the next.   Either tight or weak muscles do not give the proper support for your knee. Tight or weak muscles do not absorb the stress placed   on the knee joint. Keep the muscles surrounding the knee strong.   Take care of mechanical problems.   If you have flat feet, orthotics or special shoes may help. See your caregiver if you need help.   Arch supports, sometimes with wedges on the inner or outer aspect of the heel, can help. These can shift pressure away from the side of the knee most bothered by osteoarthritis.   A brace called an "unloader" brace also may be used to help ease the pressure on the most arthritic side of the knee.   If your caregiver has prescribed crutches, braces,  wraps or ice, use as directed. The acronym for this is PRICE. This means protection, rest, ice, compression and elevation.   Nonsteroidal anti-inflammatory drugs (NSAID's), can help relieve pain. But if taken immediately after an injury, they may actually increase swelling. Take NSAID's with food in your stomach. Stop them if you develop stomach problems. Do not take these if you have a history of ulcers, stomach pain or bleeding from the bowel. Do not take without your caregiver's approval if you have problems with fluid retention, heart failure, or kidney problems.   For ongoing knee problems, physical therapy may be helpful.   Glucosamine and chondroitin are over-the-counter dietary supplements. Both may help relieve the pain of osteoarthritis in the knee. These medicines are different from the usual anti-inflammatory drugs. Glucosamine may decrease the rate of cartilage destruction.   Injections of a corticosteroid drug into your knee joint may help reduce the symptoms of an arthritis flare-up. They may provide pain relief that lasts a few months. You may have to wait a few months between injections. The injections do have a small increased risk of infection, water retention and elevated blood sugar levels.   Hyaluronic acid injected into damaged joints may ease pain and provide lubrication. These injections may work by reducing inflammation. A series of shots may give relief for as long as 6 months.   Topical painkillers. Applying certain ointments to your skin may help relieve the pain and stiffness of osteoarthritis. Ask your pharmacist for suggestions. Many over the-counter products are approved for temporary relief of arthritis pain.   In some countries, doctors often prescribe topical NSAID's for relief of chronic conditions such as arthritis and tendinitis. A review of treatment with NSAID creams found that they worked as well as oral medications but without the serious side effects.    Prevention  Maintain a healthy weight. Extra pounds put more strain on your joints.   Get strong, stay limber. Weak muscles are a common cause of knee injuries. Stretching is important. Include flexibility exercises in your workouts.   Be smart about exercise. If you have osteoarthritis, chronic knee pain or recurring injuries, you may need to change the way you exercise. This does not mean you have to stop being active. If your knees ache after jogging or playing basketball, consider switching to swimming, water aerobics or other low-impact activities, at least for a few days a week. Sometimes limiting high-impact activities will provide relief.   Make sure your shoes fit well. Choose footwear that is right for your sport.   Protect your knees. Use the proper gear for knee-sensitive activities. Use kneepads when playing volleyball or laying carpet. Buckle your seat belt every time you drive. Most shattered kneecaps occur in car accidents.   Rest when you are tired.  SEEK MEDICAL CARE IF: You have knee pain that is continual and does not seem   to be getting better.  Seek IMMEDIATE MEDICAL CARE IF:  Your knee joint feels hot to the touch and you have a high fever. MAKE SURE YOU:   Understand these instructions.   Will watch your condition.   Will get help right away if you are not doing well or get worse.  Document Released: 08/11/2007 Document Re-Released: 01/08/2010 [employer] Patient Information 2011 [employer]. 

## 2011-05-10 NOTE — Assessment & Plan Note (Signed)
Restart chantix 

## 2011-06-06 ENCOUNTER — Ambulatory Visit (INDEPENDENT_AMBULATORY_CARE_PROVIDER_SITE_OTHER): Payer: PRIVATE HEALTH INSURANCE | Admitting: Family Medicine

## 2011-06-06 ENCOUNTER — Encounter: Payer: Self-pay | Admitting: Family Medicine

## 2011-06-06 DIAGNOSIS — E039 Hypothyroidism, unspecified: Secondary | ICD-10-CM

## 2011-06-06 DIAGNOSIS — I10 Essential (primary) hypertension: Secondary | ICD-10-CM

## 2011-06-06 DIAGNOSIS — F411 Generalized anxiety disorder: Secondary | ICD-10-CM

## 2011-06-06 DIAGNOSIS — F419 Anxiety disorder, unspecified: Secondary | ICD-10-CM

## 2011-06-06 DIAGNOSIS — Z0000 Encounter for general adult medical examination without abnormal findings: Secondary | ICD-10-CM

## 2011-06-06 DIAGNOSIS — M549 Dorsalgia, unspecified: Secondary | ICD-10-CM

## 2011-06-06 DIAGNOSIS — E785 Hyperlipidemia, unspecified: Secondary | ICD-10-CM

## 2011-06-06 DIAGNOSIS — Z1231 Encounter for screening mammogram for malignant neoplasm of breast: Secondary | ICD-10-CM

## 2011-06-06 LAB — POCT URINALYSIS DIPSTICK
Bilirubin, UA: NEGATIVE
Blood, UA: NEGATIVE
Ketones, UA: NEGATIVE
Protein, UA: NEGATIVE
Spec Grav, UA: 1.015
pH, UA: 5

## 2011-06-06 MED ORDER — LEVOTHYROXINE SODIUM 75 MCG PO TABS: 75.0000 ug | ORAL_TABLET | Freq: Every day | ORAL | Status: DC

## 2011-06-06 MED ORDER — PAROXETINE HCL 20 MG PO TABS: 20.0000 mg | ORAL_TABLET | ORAL | Status: DC

## 2011-06-06 NOTE — Progress Notes (Signed)
Subjective:     Debra [name] is a 55 y.o. female and is here for a comprehensive physical exam. The patient reports problems - back pain--no new injury---pt fell several months ago.  History   Social History  . Marital Status: Legally Separated    Spouse Name: N/A    Number of Children: 3  . Years of Education: N/A   Occupational History  . SECURITY GUARD    Social History Main Topics  . Smoking status: Current Everyday Smoker -- 1.0 packs/day    Types: Cigarettes  . Smokeless tobacco: Not on file  . Alcohol Use: 1.0 oz/week    2 drink(s) per week  . Drug Use: No  . Sexually Active: Not Currently -- Female partner(s)   Other Topics Concern  . Not on file   Social History Narrative  . No narrative on file   Health Maintenance  Topic Date Due  . Mammogram  07/25/2005  . Influenza Vaccine  07/29/2011  . Pap Smear  06/05/2013  . Colonoscopy  07/28/2019  . Tetanus/tdap  11/02/2020    The following portions of the patient's history were reviewed and updated as appropriate: allergies, current medications, past family history, past medical history, past social history, past surgical history and problem list.  Review of Systems Review of Systems  Constitutional: Negative for activity change, appetite change and fatigue.  HENT: Negative for hearing loss, congestion, tinnitus and ear discharge.  dentist --due Eyes: Negative for visual disturbance (see optho q1y -- vision corrected to 20/20 with glasses).  Respiratory: Negative for cough, chest tightness and shortness of breath.   Cardiovascular: Negative for chest pain, palpitations and leg swelling.  Gastrointestinal: Negative for abdominal pain, diarrhea, constipation and abdominal distention.  Genitourinary: Negative for urgency, frequency, decreased urine volume and difficulty urinating.  Musculoskeletal: + back pain, arthralgias and gait problem.  Skin: Negative for color change, pallor and rash.  Neurological: Negative for  dizziness, light-headedness, numbness and headaches.  Hematological: Negative for adenopathy. Does not bruise/bleed easily.  Psychiatric/Behavioral: Negative for suicidal ideas, confusion, sleep disturbance, self-injury, dysphoric mood, decreased concentration and agitation.       Objective:    BP 120/80  Pulse 66  Ht 5' 4.75" (1.645 m)  Wt 168 lb 3.2 oz (76.295 kg)  BMI 28.21 kg/m2 General appearance: alert, cooperative, appears stated age and no distress Head: Normocephalic, without obvious abnormality, atraumatic Eyes: conjunctivae/corneas clear. PERRL, EOM's intact. Fundi benign. Ears: normal TM's and external ear canals both ears Nose: Nares normal. Septum midline. Mucosa normal. No drainage or sinus tenderness. Throat: lips, mucosa, and tongue normal; teeth and gums normal Neck: no adenopathy, no carotid bruit, no JVD, supple, symmetrical, trachea midline and thyroid not enlarged, symmetric, no tenderness/mass/nodules Back: symmetric, no curvature. ROM normal. No CVA tenderness. Lungs: clear to auscultation bilaterally Breasts: normal appearance, no masses or tenderness Heart: regular rate and rhythm, S1, S2 normal, no murmur, click, rub or gallop Abdomen: soft, non-tender; bowel sounds normal; no masses,  no organomegaly Pelvic: gyn Extremities: extremities normal, atraumatic, no cyanosis or edema Pulses: 2+ and symmetric Skin: Skin color, texture, turgor normal. No rashes or lesions Lymph nodes: Cervical, supraclavicular, and axillary nodes normal. Neurologic: Alert and oriented X 3, normal strength and tone. Normal symmetric reflexes. Normal coordination and gait   Assessment:    Healthy female exam.   Low back pain Htn Hypothyroid Hyperglycemia Mixed connective tissue disease   Plan:  ghm utd rto for pap Check labs  See After Visit Summary for Counseling Recommendations

## 2011-06-06 NOTE — Patient Instructions (Signed)

## 2011-06-07 ENCOUNTER — Encounter: Payer: Self-pay | Admitting: Family Medicine

## 2011-06-07 LAB — CBC WITH DIFFERENTIAL/PLATELET
Basophils Relative: 0.2 % (ref 0.0–3.0)
Eosinophils Absolute: 0.1 10*3/uL (ref 0.0–0.7)
HCT: 35.8 % — ABNORMAL LOW (ref 36.0–46.0)
Lymphs Abs: 2.4 10*3/uL (ref 0.7–4.0)
MCHC: 33.3 g/dL (ref 30.0–36.0)
MCV: 84.3 fl (ref 78.0–100.0)
Monocytes Absolute: 0.4 10*3/uL (ref 0.1–1.0)
Neutrophils Relative %: 38.2 % — ABNORMAL LOW (ref 43.0–77.0)
RBC: 4.25 Mil/uL (ref 3.87–5.11)

## 2011-06-07 LAB — BASIC METABOLIC PANEL
BUN: 10 mg/dL (ref 6–23)
CO2: 30 mEq/L (ref 19–32)
Chloride: 108 mEq/L (ref 96–112)
Creatinine, Ser: 0.6 mg/dL (ref 0.4–1.2)

## 2011-06-07 LAB — HEPATIC FUNCTION PANEL
Albumin: 4.1 g/dL (ref 3.5–5.2)
Bilirubin, Direct: 0 mg/dL (ref 0.0–0.3)
Total Protein: 7.5 g/dL (ref 6.0–8.3)

## 2011-06-07 LAB — LIPID PANEL
Cholesterol: 195 mg/dL (ref 0–200)
HDL: 51.5 mg/dL (ref >39.00)
Triglycerides: 209 mg/dL — ABNORMAL HIGH (ref 0.0–149.0)

## 2011-06-10 ENCOUNTER — Telehealth: Payer: Self-pay | Admitting: *Deleted

## 2011-06-10 NOTE — Telephone Encounter (Signed)
Cholesterol--- LDL goal < 100, HDL >40, TG < 150. Diet and exercise will increase HDL and decrease LDL and TG. Fish, Fish Oil, Flaxseed oil will also help increase the HDL and decrease Triglycerides. It has increased since last visit. hgb is low as well---take MVI with iron . Recheck labs in 3 months.285.9 272.4 Lipid, hep, cbcd, ibc, ferritin   Left message to call back

## 2011-06-14 ENCOUNTER — Ambulatory Visit
Admission: RE | Admit: 2011-06-14 | Discharge: 2011-06-14 | Disposition: A | Discharge status: Home / Self Care | Payer: PRIVATE HEALTH INSURANCE | Source: Ambulatory Visit | Attending: Family Medicine | Admitting: Family Medicine

## 2011-06-14 DIAGNOSIS — Z1231 Encounter for screening mammogram for malignant neoplasm of breast: Secondary | ICD-10-CM

## 2011-06-24 NOTE — Telephone Encounter (Signed)
Copy of labs mailed to the patient     KP

## 2011-06-26 ENCOUNTER — Other Ambulatory Visit: Payer: Self-pay | Admitting: Family Medicine

## 2011-06-26 DIAGNOSIS — R928 Other abnormal and inconclusive findings on diagnostic imaging of breast: Secondary | ICD-10-CM

## 2011-07-11 ENCOUNTER — Ambulatory Visit (INDEPENDENT_AMBULATORY_CARE_PROVIDER_SITE_OTHER): Payer: PRIVATE HEALTH INSURANCE | Admitting: Family Medicine

## 2011-07-11 ENCOUNTER — Ambulatory Visit
Admission: RE | Admit: 2011-07-11 | Discharge: 2011-07-11 | Disposition: A | Discharge status: Home / Self Care | Payer: PRIVATE HEALTH INSURANCE | Source: Ambulatory Visit | Attending: Family Medicine | Admitting: Family Medicine

## 2011-07-11 ENCOUNTER — Encounter: Payer: Self-pay | Admitting: Family Medicine

## 2011-07-11 DIAGNOSIS — M25569 Pain in unspecified knee: Secondary | ICD-10-CM

## 2011-07-11 DIAGNOSIS — M549 Dorsalgia, unspecified: Secondary | ICD-10-CM

## 2011-07-11 DIAGNOSIS — R5381 Other malaise: Secondary | ICD-10-CM

## 2011-07-11 DIAGNOSIS — I10 Essential (primary) hypertension: Secondary | ICD-10-CM

## 2011-07-11 DIAGNOSIS — R928 Other abnormal and inconclusive findings on diagnostic imaging of breast: Secondary | ICD-10-CM

## 2011-07-11 DIAGNOSIS — M25561 Pain in right knee: Secondary | ICD-10-CM

## 2011-07-11 DIAGNOSIS — D649 Anemia, unspecified: Secondary | ICD-10-CM

## 2011-07-11 DIAGNOSIS — R5383 Other fatigue: Secondary | ICD-10-CM

## 2011-07-11 LAB — CBC WITH DIFFERENTIAL/PLATELET
Eosinophils Relative: 2.2 % (ref 0.0–5.0)
Lymphocytes Relative: 40.7 % (ref 12.0–46.0)
MCV: 84.5 fl (ref 78.0–100.0)
Monocytes Absolute: 0.3 10*3/uL (ref 0.1–1.0)
Neutrophils Relative %: 49.2 % (ref 43.0–77.0)
Platelets: 229 10*3/uL (ref 150.0–400.0)
RBC: 4.37 Mil/uL (ref 3.87–5.11)
WBC: 4.4 10*3/uL — ABNORMAL LOW (ref 4.5–10.5)

## 2011-07-11 LAB — IBC PANEL
Iron: 68 ug/dL (ref 42–145)
Saturation Ratios: 20.8 % (ref 20.0–50.0)
Transferrin: 233.5 mg/dL (ref 212.0–360.0)

## 2011-07-11 MED ORDER — FE FUM-VIT C-VIT B12-FA 460-60-0.01-1 MG PO CAPS: 1.0000 | ORAL_CAPSULE | Freq: Every day | ORAL | Status: DC

## 2011-07-11 NOTE — Progress Notes (Signed)
  Subjective:    Patient here for follow-up of elevated blood pressure.  She is not exercising and is adherent to a low-salt diet.  Blood pressure is well controlled at home. Cardiac symptoms: none. Patient denies: chest pain, dyspnea, exertional chest pressure/discomfort, irregular heart beat, lower extremity edema, near-syncope, orthopnea, palpitations, paroxysmal nocturnal dyspnea, syncope and tachypnea. Cardiovascular risk factors: hypertension and sedentary lifestyle. Use of agents associated with hypertension: none. History of target organ damage: none.  Pt c/o extreme fatigue.  She has a hx of anemia but is not taking her iron. The following portions of the patient's history were reviewed and updated as appropriate: allergies, current medications, past family history, past medical history, past social history, past surgical history and problem list.  Review of Systems Pertinent items are noted in HPI.     Objective:    BP 138/96  Pulse 72  Temp(Src) 98.4 F (36.9 C) (Oral)  Wt 170 lb 9.6 oz (77.384 kg)  SpO2 96% General appearance: alert, cooperative, appears stated age and no distress Head: Normocephalic, without obvious abnormality, atraumatic Eyes: conjunctivae/corneas clear. PERRL, EOM's intact. Fundi benign. Ears: normal TM's and external ear canals both ears Nose: Nares normal. Septum midline. Mucosa normal. No drainage or sinus tenderness. Throat: lips, mucosa, and tongue normal; teeth and gums normal Neck: no adenopathy, no carotid bruit, no JVD, supple, symmetrical, trachea midline and thyroid not enlarged, symmetric, no tenderness/mass/nodules Lungs: clear to auscultation bilaterally Heart: S1, S2 normal Extremities: extremities normal, atraumatic, no cyanosis or edema   MS--R knee---no swelling, pain with weight bearing  Assessment:    Hypertension, stage 1 . Evidence of target organ damage: none.  Fatigue-- check labs R knee pain--OA---- rec ortho-- pt wants to  wait Back pain--hx bulging disc --- r/u NS--- pt has seen Dr Jeral Fruit in [place] Plan:    Medication: no change. Dietary sodium restriction. Regular aerobic exercise. Check blood pressures 2-3 times weekly and record. Follow up: 3 months and as needed.

## 2011-07-11 NOTE — Patient Instructions (Addendum)
Fatigue Fatigue is a feeling of tiredness, lack of energy, lack of motivation, or feeling tired all the time. Having enough rest, good nutrition, and reducing stress will normally reduce fatigue. Consult your caregiver if it persists. The nature of your fatigue will help your caregiver to find out its cause. The treatment is based on the cause.  CAUSES There are many causes for fatigue. Most of the time, fatigue can be traced to one or more of your habits or routines. Most causes fit into one or more of three general areas. They are: Lifestyle problems  Sleep disturbances.  Overwork.   Physical exertion.   Unhealthy habits  Poor eating habits or eating disorders   Alcohol and/or drug use   Lack of proper nutrition (malnutrition).   Psychological problems  Stress and/or anxiety problems.  Depression.   Grief.  Boredom.   Medical Problems or Conditions  Anemia.  Pregnancy.   Thyroid gland problems.   Recovery from major surgery.   Continuous pain.   Emphysema or asthma that is not well controlled   Allergic conditions.   Diabetes.   Infections (such as mononucleosis).   Obesity.   Sleep disorders, such as sleep apnea.  Heart failure or other heart-related problems.   Cancer.   Kidney disease.   Liver disease.   Effects of certain medicines such as antihistamines, cough and cold remedies, prescription pain medicines, heart and blood pressure medicines, drugs used for treatment of cancer, and some antidepressants.   SYMPTOMS The symptoms of fatigue include:   Lack of energy.  Lack of drive (motivation).   Drowsiness.  Feeling of indifference to the surroundings.   DIAGNOSIS The details of how you feel help guide your caregiver in finding out what is causing the fatigue. You will be asked about your present and past health condition. It is important to review all medicines that you take, including prescription and non-prescription items. A thorough exam  will be done. You will be questioned about your feelings, habits, and normal lifestyle. Your caregiver may suggest blood tests, urine tests, or other tests to look for common medical causes of fatigue.  TREATMENT Fatigue is treated by correcting the underlying cause. For example, if you have continuous pain or depression, treating these causes will improve how you feel. Similarly, adjusting the dose of certain medicines will help in reducing fatigue.  HOME CARE INSTRUCTIONS  Try to get the required amount of good sleep every night.   Eat a healthy and nutritious diet, and drink enough water throughout the day.   Practice ways of relaxing (including yoga or meditation).   Exercise regularly.   Make plans to change situations that cause stress. Act on those plans so that stresses decrease over time. Keep your work and personal routine reasonable.   Avoid street drugs and minimize use of alcohol.   Start taking a daily multivitamin after consulting your caregiver.  SEEK MEDICAL CARE IF:  You have persistent tiredness, which cannot be accounted for.   You have fever.   You have unintentional weight loss.   You have headaches.   You have disturbed sleep throughout the night.   You are feeling sad.   You have constipation.   You have dry skin.   You have gained weight.   You are taking any new or different medicines that you suspect are causing fatigue.   You are unable to sleep at night.   You develop any unusual swelling of your legs or other parts  of your body.  SEEK IMMEDIATE MEDICAL CARE IF:  You are feeling confused.   Your vision is blurred.   You feel faint or pass out.   You develop severe headache.   You develop severe abdominal, pelvic, or back pain.   You develop chest pain, shortness of breath, or an irregular or fast heartbeat.   You are unable to pass a normal amount of urine.   You develop abnormal bleeding such as bleeding from the rectum or you  vomit blood.   You have thoughts about harming yourself or committing suicide.   You are worried that you might harm someone else.  MAKE SURE YOU:   Understand these instructions.   Will watch your condition.   Will get help right away if you are not doing well or get worse.  REFERENCES   [employer]  http://[email or web address]-martin.[email or web address]/  [employer] [facility]  medicalance.com Document Released: 08/11/2007 Document Re-Released: 09/26/2008 [facility] Patient Information 2011 [place], [place]. Back Pain & Injury Your back pain is most likely caused by a strain of the muscles or ligaments supporting the spine. Back strains cause pain and trouble moving because of muscle spasms. They may take several weeks to heal. Usually they are better in days.  Treatment for back pain includes:  Rest - Get bed rest as needed over the next day or two. Use a firm mattress and lie on your side with your knees slightly bent. If you lie on your back, put a pillow under your knees.   Early movement - Back pain improves most rapidly if you remain active. It is much more stressful on the back to sit or stand in one place. Do not sit, drive or stand in one place for more than 30 minutes at a time. Take short walks on level surfaces as soon as pain allows.   Limit bending and lifting - Do not bend over or lift anything over 20 pounds until instructed otherwise. Lift by bending your knees. Use your leg muscles to help. Keep the load close to your body and avoid twisting. Do not reach or do overhead work.   Medicines - Medicine to reduce pain and inflammation are helpful. Muscle-relaxing drugs may be prescribed.   Therapy - Put ice packs on your back every few hours for the first 2-3 days after your injury or as instructed. After that ice or heat may be alternated to reduce pain and spasm. Back exercises and gentle  massage may be of some benefit. You should be examined again if your back pain is not better in one week.  SEEK IMMEDIATE MEDICAL CARE IF:  You have pain that radiates from your back into your legs.   You develop new bowel or bladder control problems.   You have unusual weakness or numbness in your arms or legs.   You develop nausea or vomiting.   You develop abdominal pain.   You feel faint.  Document Released: 10/14/2005 Document Re-Released: 07/23/2008 [facility] Patient Information 2011 [place], [place].

## 2011-08-16 ENCOUNTER — Other Ambulatory Visit: Payer: Self-pay | Admitting: Neurosurgery

## 2011-08-16 DIAGNOSIS — M545 Low back pain, unspecified: Secondary | ICD-10-CM

## 2011-09-07 ENCOUNTER — Other Ambulatory Visit: Payer: PRIVATE HEALTH INSURANCE

## 2011-09-07 ENCOUNTER — Ambulatory Visit
Admission: RE | Admit: 2011-09-07 | Discharge: 2011-09-07 | Disposition: A | Discharge status: Home / Self Care | Payer: PRIVATE HEALTH INSURANCE | Source: Ambulatory Visit | Attending: Neurosurgery | Admitting: Neurosurgery

## 2011-09-07 DIAGNOSIS — M545 Low back pain, unspecified: Secondary | ICD-10-CM

## 2012-01-22 ENCOUNTER — Other Ambulatory Visit: Payer: Self-pay | Admitting: Family Medicine

## 2012-01-22 NOTE — Telephone Encounter (Signed)
Last seen 07/11/11 and filled 05/10/11 # [phone] with 2 refills. Please advise    KP

## 2012-01-23 MED ORDER — RANITIDINE HCL 150 MG PO CAPS: 150.0000 mg | ORAL_CAPSULE | Freq: Two times a day (BID) | ORAL | Status: DC

## 2012-01-23 NOTE — Telephone Encounter (Signed)
Addended by: Arnette Norris on: 01/23/2012 09:55 AM   Modules accepted: Orders

## 2012-01-27 ENCOUNTER — Encounter: Payer: Self-pay | Admitting: Family Medicine

## 2012-01-27 ENCOUNTER — Ambulatory Visit (INDEPENDENT_AMBULATORY_CARE_PROVIDER_SITE_OTHER): Payer: PRIVATE HEALTH INSURANCE | Admitting: Family Medicine

## 2012-01-27 VITALS — BP 134/82 | HR 60 | Temp 98.3°F | Wt 182.2 lb

## 2012-01-27 DIAGNOSIS — R739 Hyperglycemia, unspecified: Secondary | ICD-10-CM

## 2012-01-27 DIAGNOSIS — R5383 Other fatigue: Secondary | ICD-10-CM

## 2012-01-27 DIAGNOSIS — H539 Unspecified visual disturbance: Secondary | ICD-10-CM

## 2012-01-27 DIAGNOSIS — R7309 Other abnormal glucose: Secondary | ICD-10-CM

## 2012-01-27 DIAGNOSIS — R5381 Other malaise: Secondary | ICD-10-CM

## 2012-01-27 LAB — POCT URINALYSIS DIPSTICK
Ketones, UA: NEGATIVE
Protein, UA: NEGATIVE
Spec Grav, UA: 1.025
pH, UA: 6

## 2012-01-27 NOTE — Patient Instructions (Signed)

## 2012-01-27 NOTE — Progress Notes (Signed)
  Subjective:    Patient ID: Debra [name], female    DOB: 1956-06-16, 56 y.o.   MRN: [id number]  HPI Pt here because she is worried about diabetes. She has been very tired and is complaining about dec vision.  She has an eye doctor.   No polydypsia, polyuria.       Review of Systems As above    Objective:   Physical Exam  Constitutional: She is oriented to person, place, and time. She appears well-developed and well-nourished.  Eyes: Conjunctivae are normal. Pupils are equal, round, and reactive to light.  Neck: Normal range of motion. Neck supple.  Cardiovascular: Normal rate, regular rhythm and normal heart sounds.   No murmur heard. Pulmonary/Chest: Effort normal and breath sounds normal. No respiratory distress. She has no wheezes. She has no rales. She exhibits no tenderness.  Abdominal: Soft. Bowel sounds are normal. She exhibits no distension. There is no tenderness. There is no rebound.  Musculoskeletal: Normal range of motion. She exhibits no edema.  Lymphadenopathy:    She has no cervical adenopathy.  Neurological: She is alert and oriented to person, place, and time.  Skin: Skin is warm and dry. No rash noted.  Psychiatric: She has a normal mood and affect. Her behavior is normal. Judgment and thought content normal.          Assessment & Plan:  Fatigue---  Check labs                   MVI Dec vision---  Pt will make appointment with opth.,  Pt was told she had early cataracts

## 2012-01-28 LAB — CBC WITH DIFFERENTIAL/PLATELET
Basophils Absolute: 0 10*3/uL (ref 0.0–0.1)
Lymphocytes Relative: 44.8 % (ref 12.0–46.0)
Monocytes Relative: 7.8 % (ref 3.0–12.0)
Platelets: 168 10*3/uL (ref 150.0–400.0)
RDW: 13.9 % (ref 11.5–14.6)
WBC: 7 10*3/uL (ref 4.5–10.5)

## 2012-01-28 LAB — BASIC METABOLIC PANEL
BUN: 11 mg/dL (ref 6–23)
Calcium: 9.3 mg/dL (ref 8.4–10.5)
GFR: 101.84 mL/min (ref >60.00)
Glucose, Bld: 100 mg/dL — ABNORMAL HIGH (ref 70–99)
Sodium: 139 mEq/L (ref 135–145)

## 2012-01-28 LAB — LIPID PANEL
HDL: 51.7 mg/dL (ref >39.00)
LDL Cholesterol: 98 mg/dL (ref 0–99)
Total CHOL/HDL Ratio: 4
VLDL: 34.6 mg/dL (ref 0.0–40.0)

## 2012-01-28 LAB — HEPATIC FUNCTION PANEL
Alkaline Phosphatase: 84 U/L (ref 39–117)
Bilirubin, Direct: 0.1 mg/dL (ref 0.0–0.3)
Total Bilirubin: 0.3 mg/dL (ref 0.3–1.2)

## 2012-01-28 LAB — MICROALBUMIN / CREATININE URINE RATIO: Microalb, Ur: 3.6 mg/dL — ABNORMAL HIGH (ref 0.0–1.9)

## 2012-01-28 LAB — TSH: TSH: 2.05 u[IU]/mL (ref 0.35–5.50)

## 2012-01-28 LAB — HEMOGLOBIN A1C: Hgb A1c MFr Bld: 6.3 % (ref 4.6–6.5)

## 2012-01-30 LAB — VITAMIN D 1,25 DIHYDROXY
Vitamin D2 1, 25 (OH)2: <8 pg/mL
Vitamin D3 1, 25 (OH)2: 60 pg/mL

## 2012-02-04 ENCOUNTER — Telehealth: Payer: Self-pay | Admitting: Family Medicine

## 2012-02-04 NOTE — Telephone Encounter (Signed)
Patient states that she went on MyChart to view her test results and she has questions regarding results. She states that she does not understand what some of the results mean.

## 2012-02-04 NOTE — Telephone Encounter (Signed)
Left message to call office

## 2012-02-07 NOTE — Telephone Encounter (Signed)
Pt had question about glucose level results and clarifying what Dr Laury Axon comments mean. .Discuss with patient results and Dr Laury Axon comments.

## 2012-02-18 ENCOUNTER — Other Ambulatory Visit: Payer: Self-pay | Admitting: Family Medicine

## 2012-07-01 ENCOUNTER — Telehealth: Payer: Self-pay | Admitting: Family Medicine

## 2012-07-01 NOTE — Telephone Encounter (Signed)
Please advise in Dr.Lowne's absence.   KP 

## 2012-07-01 NOTE — Telephone Encounter (Signed)
Caller: Debra [name]/Patient; Patient Name: Debra [name]; PCP: Lelon Perla.; Best Callback Phone Number: [phone]; Reports shortness of breath when laying flat, dry cough, nasal congestion. Reports "problems with my ears and sinuses." Reports heaviness to the chest at times during the night. Has had problems with increasing fatigue during the day not related to any breathing issues. Onset approximately 1 month. Patient sounds mildly short of breath on the telephone during this conversation. Emergent symptom of "New or worsening breathing problems not responding to treatment or no treatment plan" positive per Breathing Problems guideline. Disposition: See provider within 4 hours. No appointments available at this time during the 4 hour time frame. PLEASE CALL THE PATIENT BACK REGARDING A WORK IN APPOINTMENT OR FURTHER INSTRUCTIONS.

## 2012-07-01 NOTE — Telephone Encounter (Signed)
noted 

## 2012-07-01 NOTE — Telephone Encounter (Signed)
Discussed with patient and she stated she is not having any shortness of breathe she is having issue with her Hiatal Hernia and it bothers her worst at night when she lays down to sleep. She requested to have a follow up for the Hernia next Thursday because it was her day off, she denied being in any distress and I made her aware if that changes she can call in an we definitely see her sooner, she voiced understanding an agreed.    KP

## 2012-07-01 NOTE — Telephone Encounter (Signed)
If pt is short of breath on phone needs to be seen at [facility]

## 2012-07-09 ENCOUNTER — Encounter: Payer: Self-pay | Admitting: Family Medicine

## 2012-07-09 ENCOUNTER — Ambulatory Visit (INDEPENDENT_AMBULATORY_CARE_PROVIDER_SITE_OTHER): Payer: PRIVATE HEALTH INSURANCE | Admitting: Family Medicine

## 2012-07-09 VITALS — BP 130/82 | HR 79 | Temp 98.3°F | Wt 174.0 lb

## 2012-07-09 DIAGNOSIS — R079 Chest pain, unspecified: Secondary | ICD-10-CM

## 2012-07-09 DIAGNOSIS — H9190 Unspecified hearing loss, unspecified ear: Secondary | ICD-10-CM

## 2012-07-09 DIAGNOSIS — F17200 Nicotine dependence, unspecified, uncomplicated: Secondary | ICD-10-CM

## 2012-07-09 DIAGNOSIS — Z87891 Personal history of nicotine dependence: Secondary | ICD-10-CM

## 2012-07-09 DIAGNOSIS — K449 Diaphragmatic hernia without obstruction or gangrene: Secondary | ICD-10-CM

## 2012-07-09 MED ORDER — ESOMEPRAZOLE MAGNESIUM 40 MG PO CPDR: 40.0000 mg | DELAYED_RELEASE_CAPSULE | Freq: Every day | ORAL | Status: DC

## 2012-07-09 MED ORDER — VARENICLINE TARTRATE 0.5 MG X 11 & 1 MG X 42 PO MISC: ORAL | Status: AC

## 2012-07-09 NOTE — Patient Instructions (Addendum)
Diet for GERD or PUD Nutrition therapy can help ease the discomfort of gastroesophageal reflux disease (GERD) and peptic ulcer disease (PUD).  HOME CARE INSTRUCTIONS   Eat your meals slowly, in a relaxed setting.   Eat 5 to 6 small meals per day.   If a food causes distress, stop eating it for a period of time.  FOODS TO AVOID  Coffee, regular or decaffeinated.   Cola beverages, regular or low calorie.   Tea, regular or decaffeinated.   Pepper.   Cocoa.   High fat foods, including meats.   Butter, margarine, hydrogenated oil (trans fats).   Peppermint or spearmint (if you have GERD).   Fruits and vegetables if not tolerated.   Alcohol.   Nicotine (smoking or chewing). This is one of the most potent stimulants to acid production in the gastrointestinal tract.   Any food that seems to aggravate your condition.  If you have questions regarding your diet, ask your caregiver or a registered dietitian. TIPS  Lying flat may make symptoms worse. Keep the head of your bed raised 6 to 9 inches (15 to 23 cm) by using a foam wedge or blocks under the legs of the bed.   Do not lay down until 3 hours after eating a meal.   Daily physical activity may help reduce symptoms.  MAKE SURE YOU:   Understand these instructions.   Will watch your condition.   Will get help right away if you are not doing well or get worse.  Document Released: 10/14/2005 Document Revised: 10/03/2011 Document Reviewed: 08/30/2011 [facility] Patient Information 2012 [place], [place].  Smoking Cessation, Tips for Success YOU CAN QUIT SMOKING If you are ready to quit smoking, congratulations! You have chosen to help yourself be healthier. Cigarettes bring nicotine, tar, carbon monoxide, and other irritants into your body. Your lungs, heart, and blood vessels will be able to work better without these poisons. There are many different ways to quit smoking. Nicotine gum, nicotine patches, a nicotine inhaler, or  nicotine nasal spray can help with physical craving. Hypnosis, support groups, and medicines help break the habit of smoking. Here are some tips to help you quit for good.  Throw away all cigarettes.   Clean and remove all ashtrays from your home, work, and car.   On a card, write down your reasons for quitting. Carry the card with you and read it when you get the urge to smoke.   Cleanse your body of nicotine. Drink enough water and fluids to keep your urine clear or pale yellow. Do this after quitting to flush the nicotine from your body.   Learn to predict your moods. Do not let a bad situation be your excuse to have a cigarette. Some situations in your life might tempt you into wanting a cigarette.   Never have "just one" cigarette. It leads to wanting another and another. Remind yourself of your decision to quit.   Change habits associated with smoking. If you smoked while driving or when feeling stressed, try other activities to replace smoking. Stand up when drinking your coffee. Brush your teeth after eating. Sit in a different chair when you read the paper. Avoid alcohol while trying to quit, and try to drink fewer caffeinated beverages. Alcohol and caffeine may urge you to smoke.   Avoid foods and drinks that can trigger a desire to smoke, such as sugary or spicy foods and alcohol.   Ask people who smoke not to smoke around you.  Have something planned to do right after eating or having a cup of coffee. Take a walk or exercise to perk you up. This will help to keep you from overeating.   Try a relaxation exercise to calm you down and decrease your stress. Remember, you may be tense and nervous for the first 2 weeks after you quit, but this will pass.   Find new activities to keep your hands busy. Play with a pen, coin, or rubber band. Doodle or draw things on paper.   Brush your teeth right after eating. This will help cut down on the craving for the taste of tobacco after meals.  You can try mouthwash, too.   Use oral substitutes, such as lemon drops, carrots, a cinnamon stick, or chewing gum, in place of cigarettes. Keep them handy so they are available when you have the urge to smoke.   When you have the urge to smoke, try deep breathing.   Designate your home as a nonsmoking area.   If you are a heavy smoker, ask your caregiver about a prescription for nicotine chewing gum. It can ease your withdrawal from nicotine.   Reward yourself. Set aside the cigarette money you save and buy yourself something nice.   Look for support from others. Join a support group or smoking cessation program. Ask someone at home or at work to help you with your plan to quit smoking.   Always ask yourself, "Do I need this cigarette or is this just a reflex?" Tell yourself, "Today, I choose not to smoke," or "I do not want to smoke." You are reminding yourself of your decision to quit, even if you do smoke a cigarette.  HOW WILL I FEEL WHEN I QUIT SMOKING?  The benefits of not smoking start within days of quitting.   You may have symptoms of withdrawal because your body is used to nicotine (the addictive substance in cigarettes). You may crave cigarettes, be irritable, feel very hungry, cough often, get headaches, or have difficulty concentrating.   The withdrawal symptoms are only temporary. They are strongest when you first quit but will go away within 10 to 14 days.   When withdrawal symptoms occur, stay in control. Think about your reasons for quitting. Remind yourself that these are signs that your body is healing and getting used to being without cigarettes.   Remember that withdrawal symptoms are easier to treat than the major diseases that smoking can cause.   Even after the withdrawal is over, expect periodic urges to smoke. However, these cravings are generally short-lived and will go away whether you smoke or not. Do not smoke!   If you relapse and smoke again, do not lose  hope. Most smokers quit 3 times before they are successful.   If you relapse, do not give up! Plan ahead and think about what you will do the next time you get the urge to smoke.  LIFE AS A NONSMOKER: MAKE IT FOR A MONTH, MAKE IT FOR LIFE Day 1: Hang this page where you will see it every day. Day 2: Get rid of all ashtrays, matches, and lighters. Day 3: Drink water. Breathe deeply between sips. Day 4: Avoid places with smoke-filled air, such as bars, clubs, or the smoking section of restaurants. Day 5: Keep track of how much money you save by not smoking. Day 6: Avoid boredom. Keep a good book with you or go to the movies. Day 7: Reward yourself! One week without smoking!  Day 8: Make a dental appointment to get your teeth cleaned. Day 9: Decide how you will turn down a cigarette before it is offered to you. Day 10: Review your reasons for quitting. Day 11: Distract yourself. Stay active to keep your mind off smoking and to relieve tension. Take a walk, exercise, read a book, do a crossword puzzle, or try a new hobby. Day 12: Exercise. Get off the bus before your stop or use stairs instead of escalators. Day 13: Call on friends for support and encouragement. Day 14: Reward yourself! Two weeks without smoking! Day 15: Practice deep breathing exercises. Day 16: Bet a friend that you can stay a nonsmoker. Day 17: Ask to sit in nonsmoking sections of restaurants. Day 18: Hang up "No Smoking" signs. Day 19: Think of yourself as a nonsmoker. Day 20: Each morning, tell yourself you will not smoke. Day 21: Reward yourself! Three weeks without smoking! Day 22: Think of smoking in negative ways. Remember how it stains your teeth, gives you bad breath, and leaves you short of breath. Day 23: Eat a nutritious breakfast. Day 24:Do not relive your days as a smoker. Day 25: Hold a pencil in your hand when talking on the telephone. Day 26: Tell all your friends you do not smoke. Day 27: Think about how  much better food tastes. Day 28: Remember, one cigarette is one too many. Day 29: Take up a hobby that will keep your hands busy. Day 30: Congratulations! One month without smoking! Give yourself a big reward. Your caregiver can direct you to community resources or hospitals for support, which may include:  Group support.   Education.   Hypnosis.   Subliminal therapy.  Document Released: 07/12/2004 Document Revised: 10/03/2011 Document Reviewed: 07/31/2009 [facility] Patient Information 2012 [place], [place].

## 2012-07-09 NOTE — Progress Notes (Signed)
  Subjective:    Debra [name] is a 56 y.o. female who presents for evaluation of chest pain. Onset was several weeks ago. Symptoms have been unchanged since that time. The patient describes the pain as pressure and radiates to the left arm. Patient rates pain as a 4/10 in intensity. Associated symptoms are: chest pain and exertional chest pressure/discomfort. Aggravating factors are: coughing, emotional stress, exercise and food. Alleviating factors are: rest and ppi . Patient's cardiac risk factors are: obesity (BMI >= 30 kg/m2), sedentary lifestyle and smoking/ tobacco exposure. Patient's risk factors for DVT/PE: none. Previous cardiac testing: electrocardiogram (ECG).  Pt also complains of decreased hearing.  Her entire family is telling her to get her hearing checked .  TV is always loud, etc.   The following portions of the patient's history were reviewed and updated as appropriate: allergies, current medications, past family history, past medical history, past social history, past surgical history and problem list.  Review of Systems Pertinent items are noted in HPI.    Objective:    BP 130/82  Pulse 79  Temp 98.3 F (36.8 C) (Oral)  Wt 174 lb (78.926 kg)  SpO2 96% General appearance: alert, cooperative, appears stated age and no distress Eyes: conjunctivae/corneas clear. PERRL, EOM's intact. Fundi benign. Ears: normal TM's and external ear canals both ears Nose: Nares normal. Septum midline. Mucosa normal. No drainage or sinus tenderness. Throat: lips, mucosa, and tongue normal; teeth and gums normal Neck: no adenopathy, supple, symmetrical, trachea midline and thyroid not enlarged, symmetric, no tenderness/mass/nodules Lungs: clear to auscultation bilaterally Heart: S1, S2 normal Abdomen: soft, non-tender; bowel sounds normal; no masses,  no organomegaly Extremities: extremities normal, atraumatic, no cyanosis or edema Psych-- pt is very anxious---had stopped all  meds---recently started back on paxil  Cardiographics ECG: occassional pacs  Imaging Chest x-ray: normal chest x-ray and not done    Assessment:    Chest pain, suspected etiology: GERD ---nexium   hearing loss---refer to ENT Anxiety--- con't paxil and ativan  Plan:    Patient history and exam consistent with non-cardiac cause of chest pain. Conservative measures indicated. OTC analgesics as needed. Worsening signs and symptoms discussed and patient verbalized understanding. Follow up with me in few weeks  -.   At the end of the visit pt started to co memory loss ---she will make f/u appointment for this

## 2012-07-09 NOTE — Assessment & Plan Note (Signed)
chantix rx See AVS

## 2012-07-10 ENCOUNTER — Other Ambulatory Visit (INDEPENDENT_AMBULATORY_CARE_PROVIDER_SITE_OTHER): Payer: PRIVATE HEALTH INSURANCE

## 2012-07-10 DIAGNOSIS — M549 Dorsalgia, unspecified: Secondary | ICD-10-CM

## 2012-07-10 LAB — CBC WITH DIFFERENTIAL/PLATELET
Basophils Absolute: 0 10*3/uL (ref 0.0–0.1)
Basophils Relative: 0 % (ref 0–1)
HCT: 33 % — ABNORMAL LOW (ref 36.0–46.0)
Hemoglobin: 11.5 g/dL — ABNORMAL LOW (ref 12.0–15.0)
Lymphocytes Relative: 46 % (ref 12–46)
Monocytes Absolute: 0.5 10*3/uL (ref 0.1–1.0)
Neutro Abs: 2.5 10*3/uL (ref 1.7–7.7)
Neutrophils Relative %: 43 % (ref 43–77)
RDW: 13.7 % (ref 11.5–15.5)
WBC: 5.8 10*3/uL (ref 4.0–10.5)

## 2012-07-10 LAB — HEMOGLOBIN A1C
Hgb A1c MFr Bld: 6.1 % — ABNORMAL HIGH (ref <5.7)
Mean Plasma Glucose: 128 mg/dL — ABNORMAL HIGH (ref <117)

## 2012-07-11 LAB — HEPATIC FUNCTION PANEL
Albumin: 4.2 g/dL (ref 3.5–5.2)
Alkaline Phosphatase: 83 U/L (ref 39–117)
Total Bilirubin: 0.4 mg/dL (ref 0.3–1.2)
Total Protein: 7.4 g/dL (ref 6.0–8.3)

## 2012-07-11 LAB — BASIC METABOLIC PANEL
Calcium: 9.2 mg/dL (ref 8.4–10.5)
Creat: 0.8 mg/dL (ref 0.50–1.10)
Glucose, Bld: 86 mg/dL (ref 70–99)
Sodium: 139 mEq/L (ref 135–145)

## 2012-07-11 LAB — LIPID PANEL
Cholesterol: 201 mg/dL — ABNORMAL HIGH (ref 0–200)
Total CHOL/HDL Ratio: 4.8 Ratio

## 2012-07-13 LAB — H. PYLORI ANTIBODY, IGG: H Pylori IgG: <0.4 {ISR}

## 2012-07-24 ENCOUNTER — Encounter: Payer: Self-pay | Admitting: Family Medicine

## 2012-07-24 ENCOUNTER — Ambulatory Visit (INDEPENDENT_AMBULATORY_CARE_PROVIDER_SITE_OTHER): Payer: PRIVATE HEALTH INSURANCE | Admitting: Family Medicine

## 2012-07-24 VITALS — BP 146/90 | HR 70 | Temp 98.2°F | Wt 177.2 lb

## 2012-07-24 DIAGNOSIS — K219 Gastro-esophageal reflux disease without esophagitis: Secondary | ICD-10-CM

## 2012-07-24 DIAGNOSIS — H9190 Unspecified hearing loss, unspecified ear: Secondary | ICD-10-CM

## 2012-07-24 DIAGNOSIS — R413 Other amnesia: Secondary | ICD-10-CM

## 2012-07-24 HISTORY — DX: Unspecified hearing loss, unspecified ear: H91.90

## 2012-07-24 MED ORDER — DONEPEZIL HCL 5 MG PO TABS: 5.0000 mg | ORAL_TABLET | Freq: Every evening | ORAL | Status: DC | PRN

## 2012-07-24 MED ORDER — PANTOPRAZOLE SODIUM 40 MG PO TBEC: 40.0000 mg | DELAYED_RELEASE_TABLET | Freq: Every day | ORAL | Status: DC

## 2012-07-24 NOTE — Progress Notes (Signed)
  Subjective:    Patient ID: Debra [name], female    DOB: [birth year], 56 y.o.   MRN: [id number]  HPI Pt here c/o memory loss and hearing loss---worsening over the last several months.  Family members are even noticing. Pt also needs something other than her prevacid -----nexium too expensive.  No other complaints   Review of Systems As above    Objective:   Physical Exam  Constitutional: She is oriented to person, place, and time. She appears well-developed and well-nourished.  HENT:  Right Ear: External ear normal.  Left Ear: External ear normal.  Neck: Normal range of motion. Neck supple.  Cardiovascular: Normal rate, regular rhythm and normal heart sounds.   Pulmonary/Chest: Effort normal and breath sounds normal.  Musculoskeletal: Normal range of motion. She exhibits no edema and no tenderness.  Neurological: She is alert and oriented to person, place, and time. No cranial nerve deficit. Coordination normal.  Psychiatric: She has a normal mood and affect. Her behavior is normal.       MMSE 26/30          Assessment & Plan:

## 2012-07-24 NOTE — Assessment & Plan Note (Signed)
Refer to neuro MRI brain Check labs aricept 5 mg  rto prn

## 2012-07-24 NOTE — Patient Instructions (Addendum)
Dementia  Dementia is a general term for problems with brain function. A person with dementia has memory loss and a hard time with at least one other brain function such as thinking, speaking, or problem solving. Dementia can affect social functioning, how you do your job, your mood, or your personality. The changes may be hidden for a long time. The earliest forms of this disease are usually not detected by family or friends.  Dementia can be:   Irreversible.   Potentially reversible.   Partially reversible.   Progressive. This means it can get worse over time.  CAUSES   Irreversible dementia causes may include:   Degeneration of brain cells (Alzheimer's disease or lewy body dementia).   Multiple small strokes (vascular dementia).   Infection (chronic meningitis or Creutzfelt-Jakob disease).   Frontotemporal dementia. This affects younger people, age 40 to 70, compared to those who have Alzheimer's disease.   Dementia associated with other disorders like Parkinson's disease, Huntington's disease, or HIV-associated dementia.  Potentially or partially reversible dementia causes may include:   Medicines.   Metabolic causes such as excessive alcohol intake, vitamin B12 deficiency, or thyroid disease.   Masses or pressure in the brain such as a tumor, blood clot, or hydrocephalus.  SYMPTOMS   Symptoms are often hard to detect. Family members or coworkers may not notice them early in the disease process. Different people with dementia may have different symptoms. Symptoms can include:   A hard time with memory, especially recent memory. Long-term memory may not be impaired.   Asking the same question multiple times or forgetting something someone just said.   A hard time speaking your thoughts or finding certain words.   A hard time solving problems or performing familiar tasks (such as how to use a telephone).   Sudden changes in mood.   Changes in personality, especially increasing moodiness or  mistrust.   Depression.   A hard time understanding complex ideas that were never a problem in the past.  DIAGNOSIS   There are no specific tests for dementia.    Your caregiver may recommend a thorough evaluation. This is because some forms of dementia can be reversible. The evaluation will likely include a physical exam and getting a detailed history from you and a family member. The history often gives the best clues and suggestions for a diagnosis.   Memory testing may be done. A detailed brain function evaluation called neuropsychologic testing may be helpful.   Lab tests and brain imaging (such as a CT scan or MRI scan) are sometimes important.   Sometimes observation and re-evaluation over time is very helpful.  TREATMENT   Treatment depends on the cause.    If the problem is a vitamin deficiency, it may be helped or cured with supplements.   For dementias such as Alzheimer's disease, medicines are available to stabilize or slow the course of the disease. There are no cures for this type of dementia.   Your caregiver can help direct you to groups, organizations, and other caregivers to help with decisions in the care of you or your loved one.  HOME CARE INSTRUCTIONS  The care of individuals with dementia is varied and dependent upon the progression of the dementia. The following suggestions are intended for the person living with, or caring for, the person with dementia.   Create a safe environment.   Remove the locks on bathroom doors to prevent the person from accidentally locking himself or herself in.     Use childproof latches on kitchen cabinets and any place where cleaning supplies, chemicals, or alcohol are kept.   Use childproof covers in unused electrical outlets.   Install childproof devices to keep doors and windows secured.   Remove stove knobs or install safety knobs and an automatic shut-off on the stove.   Lower the temperature on water heaters.   Label medicines and keep them  locked up.   Secure knives, lighters, matches, power tools, and guns, and keep these items out of reach.   Keep the house free from clutter. Remove rugs or anything that might contribute to a fall.   Remove objects that might break and hurt the person.   Make sure lighting is good, both inside and outside.   Install grab rails as needed.   Use a monitoring device to alert you to falls or other needs for help.   Reduce confusion.   Keep familiar objects and people around.   Use night lights or dim lights at night.   Label items or areas.   Use reminders, notes, or directions for daily activities or tasks.   Keep a simple, consistent routine for waking, meals, bathing, dressing, and bedtime.   Create a calm, quiet environment.   Place large clocks and calendars prominently.   Display emergency numbers and home address near all telephones.   Use cues to establish different times of the day. An example is to open curtains to let the natural light in during the day.    Use effective communication.   Choose simple words and short sentences.   Use a gentle, calm tone of voice.   Be careful not to interrupt.   If the person is struggling to find a word or communicate a thought, try to provide the word or thought.   Ask one question at a time. Allow the person ample time to answer questions. Repeat the question again if the person does not respond.   Reduce nighttime restlessness.   Provide a comfortable bed.   Have a consistent nighttime routine.   Ensure a regular walking or physical activity schedule. Involve the person in daily activities as much as possible.   Limit napping during the day.   Limit caffeine.   Attend social events that stimulate rather than overwhelm the senses.   Encourage good nutrition and hydration.   Reduce distractions during meal times and snacks.   Avoid foods that are too hot or too cold.   Monitor chewing and swallowing ability.   Continue with routine vision,  hearing, dental, and medical screenings.   Only give over-the-counter or prescription medicines as directed by the caregiver.   Monitor driving abilities. Do not allow the person to drive when safe driving is no longer possible.   Register with an identification program which could provide location assistance in the event of a missing person situation.  SEEK MEDICAL CARE IF:    New behavioral problems start such as moodiness, aggressiveness, or seeing things that are not there (hallucinations).   Any new problem with brain function happens. This includes problems with balance, speech, or falling a lot.   Problems with swallowing develop.   Any symptoms of other illness happen.  Small changes or worsening in any aspect of brain function can be a sign that the illness is getting worse. It can also be a sign of another medical illness such as infection. Seeing a caregiver right away is important.  SEEK IMMEDIATE MEDICAL CARE IF:      A fever develops.   New or worsened confusion develops.   New or worsened sleepiness develops.   Staying awake becomes hard to do.  Document Released: 04/09/2001 Document Revised: 10/03/2011 Document Reviewed: 03/11/2011  [employer] Patient Information 2012 [employer].

## 2012-07-24 NOTE — Assessment & Plan Note (Signed)
Refer to hearing clinic

## 2012-07-25 LAB — VITAMIN B12: Vitamin B-12: >2000 pg/mL — ABNORMAL HIGH (ref 211–911)

## 2012-07-27 ENCOUNTER — Other Ambulatory Visit: Payer: Self-pay | Admitting: Family Medicine

## 2012-07-27 NOTE — Telephone Encounter (Signed)
Need okay.   MW  

## 2012-09-09 ENCOUNTER — Telehealth: Payer: Self-pay | Admitting: Family Medicine

## 2012-09-09 NOTE — Telephone Encounter (Signed)
Error. BC °

## 2012-09-11 ENCOUNTER — Encounter: Payer: PRIVATE HEALTH INSURANCE | Admitting: Family Medicine

## 2012-09-11 ENCOUNTER — Telehealth: Payer: Self-pay

## 2012-09-11 NOTE — Telephone Encounter (Signed)
Please advise if the DHEA would interact with the Aricept. KP

## 2012-09-11 NOTE — Telephone Encounter (Signed)
Message copied by Arnette Norris on Fri Sep 11, 2012  9:57 AM ------      Message from: Arvilla Meres P      Created: Wed Sep 09, 2012  3:22 PM      Contact: [phone]       Patient called to cancel her appointment. She would like Dr. Ernst Spell assistant to call her back. She would not say what it was regarding.

## 2012-09-11 NOTE — Telephone Encounter (Signed)
I would really like her to see Neuro----she can hold off on MRI until Neuro sees her to see if they want it but I am concerned about her too and Neuro is important

## 2012-09-11 NOTE — Telephone Encounter (Signed)
Spoke with patient and she is concerned about her memory loss, but her bills are increasing due to themedical tests and things that she has had done. She currently is not taking the Aricept, has not seen Neuro and cancelled the brain MRI. I advised the patient without her following Dr.Lowne's orders it would be difficult for [place] to know what is going on, she stated she could not take the medication because it was expensive and she wanted to know if she did take it would it interact with her DHEA? I advised I would ask and told her to contact [facility] and find out if she can do a payment arrangement but it is important to have these procedures done.   Please advise if the DHEA would interact with the Aricept.    KP

## 2012-09-11 NOTE — Telephone Encounter (Signed)
Discussed with patient and she said she would call the Neurologist on Monday and she is aware there is no interaction between the two. She voiced understanding.     KP

## 2012-09-11 NOTE — Telephone Encounter (Signed)
No interactions

## 2012-12-12 ENCOUNTER — Other Ambulatory Visit: Payer: Self-pay

## 2012-12-31 ENCOUNTER — Ambulatory Visit (INDEPENDENT_AMBULATORY_CARE_PROVIDER_SITE_OTHER): Payer: Self-pay | Admitting: Family Medicine

## 2012-12-31 ENCOUNTER — Encounter: Payer: Self-pay | Admitting: Family Medicine

## 2012-12-31 VITALS — BP 154/96 | HR 87 | Temp 98.1°F | Wt 164.4 lb

## 2012-12-31 DIAGNOSIS — F411 Generalized anxiety disorder: Secondary | ICD-10-CM

## 2012-12-31 DIAGNOSIS — R5381 Other malaise: Secondary | ICD-10-CM

## 2012-12-31 DIAGNOSIS — M549 Dorsalgia, unspecified: Secondary | ICD-10-CM

## 2012-12-31 DIAGNOSIS — N390 Urinary tract infection, site not specified: Secondary | ICD-10-CM

## 2012-12-31 DIAGNOSIS — B354 Tinea corporis: Secondary | ICD-10-CM

## 2012-12-31 DIAGNOSIS — F419 Anxiety disorder, unspecified: Secondary | ICD-10-CM

## 2012-12-31 DIAGNOSIS — E119 Type 2 diabetes mellitus without complications: Secondary | ICD-10-CM

## 2012-12-31 DIAGNOSIS — R413 Other amnesia: Secondary | ICD-10-CM

## 2012-12-31 DIAGNOSIS — I10 Essential (primary) hypertension: Secondary | ICD-10-CM

## 2012-12-31 DIAGNOSIS — R5383 Other fatigue: Secondary | ICD-10-CM

## 2012-12-31 DIAGNOSIS — G8929 Other chronic pain: Secondary | ICD-10-CM

## 2012-12-31 LAB — CBC WITH DIFFERENTIAL/PLATELET
Basophils Absolute: 0 10*3/uL (ref 0.0–0.1)
Eosinophils Absolute: 0.1 10*3/uL (ref 0.0–0.7)
Hemoglobin: 11.4 g/dL — ABNORMAL LOW (ref 12.0–15.0)
Lymphocytes Relative: 26.5 % (ref 12.0–46.0)
MCHC: 33.4 g/dL (ref 30.0–36.0)
Monocytes Relative: 6.4 % (ref 3.0–12.0)
Neutro Abs: 3.6 10*3/uL (ref 1.4–7.7)
Platelets: 259 10*3/uL (ref 150.0–400.0)
RDW: 14.2 % (ref 11.5–14.6)

## 2012-12-31 LAB — POCT URINALYSIS DIPSTICK
Nitrite, UA: NEGATIVE
pH, UA: 6

## 2012-12-31 LAB — BASIC METABOLIC PANEL
BUN: 9 mg/dL (ref 6–23)
CO2: 26 mEq/L (ref 19–32)
Calcium: 9.3 mg/dL (ref 8.4–10.5)
Creatinine, Ser: 0.7 mg/dL (ref 0.4–1.2)
GFR: 97.96 mL/min (ref >60.00)
Glucose, Bld: 95 mg/dL (ref 70–99)
Sodium: 141 mEq/L (ref 135–145)

## 2012-12-31 LAB — HEPATIC FUNCTION PANEL
ALT: 12 U/L (ref 0–35)
AST: 19 U/L (ref 0–37)
Alkaline Phosphatase: 72 U/L (ref 39–117)
Bilirubin, Direct: 0.1 mg/dL (ref 0.0–0.3)
Total Protein: 7.5 g/dL (ref 6.0–8.3)

## 2012-12-31 LAB — MICROALBUMIN / CREATININE URINE RATIO
Microalb Creat Ratio: 1.6 mg/g (ref 0.0–30.0)
Microalb, Ur: 5.4 mg/dL — ABNORMAL HIGH (ref 0.0–1.9)

## 2012-12-31 LAB — LIPID PANEL: Total CHOL/HDL Ratio: 3

## 2012-12-31 MED ORDER — NYSTATIN 100000 UNIT/GM EX POWD: Freq: Four times a day (QID) | CUTANEOUS | Status: DC

## 2012-12-31 MED ORDER — GABAPENTIN 600 MG PO TABS: 600.0000 mg | ORAL_TABLET | Freq: Three times a day (TID) | ORAL | Status: DC

## 2012-12-31 MED ORDER — GABAPENTIN 300 MG PO CAPS: 300.0000 mg | ORAL_CAPSULE | Freq: Three times a day (TID) | ORAL | Status: DC

## 2012-12-31 MED ORDER — PAROXETINE HCL 20 MG PO TABS: 20.0000 mg | ORAL_TABLET | ORAL | Status: DC

## 2012-12-31 MED ORDER — LORAZEPAM 0.5 MG PO TABS: 0.5000 mg | ORAL_TABLET | Freq: Three times a day (TID) | ORAL | Status: DC | PRN

## 2012-12-31 NOTE — Patient Instructions (Addendum)
Back Pain, Adult Low back pain is very common. About 1 in 5 people have back pain.The cause of low back pain is rarely dangerous. The pain often gets better over time.About half of people with a sudden onset of back pain feel better in just 2 weeks. About 8 in 10 people feel better by 6 weeks.  CAUSES Some common causes of back pain include:  Strain of the muscles or ligaments supporting the spine.  Wear and tear (degeneration) of the spinal discs.  Arthritis.  Direct injury to the back. DIAGNOSIS Most of the time, the direct cause of low back pain is not known.However, back pain can be treated effectively even when the exact cause of the pain is unknown.Answering your caregiver's questions about your overall health and symptoms is one of the most accurate ways to make sure the cause of your pain is not dangerous. If your caregiver needs more information, he or she may order lab work or imaging tests (X-rays or MRIs).However, even if imaging tests show changes in your back, this usually does not require surgery. HOME CARE INSTRUCTIONS For many people, back pain returns.Since low back pain is rarely dangerous, it is often a condition that people can learn to manageon their own.   Remain active. It is stressful on the back to sit or stand in one place. Do not sit, drive, or stand in one place for more than 30 minutes at a time. Take short walks on level surfaces as soon as pain allows.Try to increase the length of time you walk each day.  Do not stay in bed.Resting more than 1 or 2 days can delay your recovery.  Do not avoid exercise or work.Your body is made to move.It is not dangerous to be active, even though your back may hurt.Your back will likely heal faster if you return to being active before your pain is gone.  Pay attention to your body when you bend and lift. Many people have less discomfortwhen lifting if they bend their knees, keep the load close to their bodies,and  avoid twisting. Often, the most comfortable positions are those that put less stress on your recovering back.  Find a comfortable position to sleep. Use a firm mattress and lie on your side with your knees slightly bent. If you lie on your back, put a pillow under your knees.  Only take over-the-counter or prescription medicines as directed by your caregiver. Over-the-counter medicines to reduce pain and inflammation are often the most helpful.Your caregiver may prescribe muscle relaxant drugs.These medicines help dull your pain so you can more quickly return to your normal activities and healthy exercise.  Put ice on the injured area.  Put ice in a plastic bag.  Place a towel between your skin and the bag.  Leave the ice on for 15 to 20 minutes, 3 to 4 times a day for the first 2 to 3 days. After that, ice and heat may be alternated to reduce pain and spasms.  Ask your caregiver about trying back exercises and gentle massage. This may be of some benefit.  Avoid feeling anxious or stressed.Stress increases muscle tension and can worsen back pain.It is important to recognize when you are anxious or stressed and learn ways to manage it.Exercise is a great option. SEEK MEDICAL CARE IF:  You have pain that is not relieved with rest or medicine.  You have pain that does not improve in 1 week.  You have new symptoms.  You are generally   not feeling well. SEEK IMMEDIATE MEDICAL CARE IF:   You have pain that radiates from your back into your legs.  You develop new bowel or bladder control problems.  You have unusual weakness or numbness in your arms or legs.  You develop nausea or vomiting.  You develop abdominal pain.  You feel faint. Document Released: 10/14/2005 Document Revised: 04/14/2012 Document Reviewed: 03/04/2011 Exit[employer] Patient Information 2013 [employer].  

## 2012-12-31 NOTE — Progress Notes (Signed)
  Subjective:    Debra [name] is a 57 y.o. female who presents for follow up of low back problems. Current symptoms include: numbness in R leg, pain in R leg (aching, burning and numbing in character; 8/10 in severity) and weakness in R leg. Symptoms have significantly worsened from the previous visit. Exacerbating factors identified by the patient are bending forwards and walking.  The following portions of the patient's history were reviewed and updated as appropriate: allergies, current medications, past family history, past medical history, past social history, past surgical history and problem list.    Objective:    BP 154/96  Pulse 87  Temp(Src) 98.1 F (36.7 C) (Oral)  Wt 164 lb 6.4 oz (74.571 kg)  BMI 27.56 kg/m2  SpO2 97% General appearance: alert, cooperative, appears stated age and no distress Extremities: extremities normal, atraumatic, no cyanosis or edema Skin: + hyperpigmented skin under breast c/w tinea Neurologic: Motor: weakness with dorsiflexion R foot Reflexes: 2+ and symmetric Gait: Antalgic    Assessment:    Nonspecific acute low back pain   tinea corporis-- nystatin powder Plan:    Natural history and expected course discussed. Questions answered. Agricultural engineer distributed. Proper lifting, bending technique discussed. Stretching exercises discussed. Regular aerobic and trunk strengthening exercises discussed. Short (2-4 day) period of relative rest recommended until acute symptoms improve. Ice to affected area as needed for local pain relief. Heat to affected area as needed for local pain relief. Muscle relaxants per medication orders. xray and see orders

## 2013-01-02 LAB — URINE CULTURE
Colony Count: NO GROWTH
Organism ID, Bacteria: NO GROWTH

## 2013-01-13 ENCOUNTER — Encounter: Payer: Self-pay | Admitting: Lab

## 2013-01-14 ENCOUNTER — Ambulatory Visit (INDEPENDENT_AMBULATORY_CARE_PROVIDER_SITE_OTHER): Payer: PRIVATE HEALTH INSURANCE | Admitting: Family Medicine

## 2013-01-14 ENCOUNTER — Ambulatory Visit (HOSPITAL_BASED_OUTPATIENT_CLINIC_OR_DEPARTMENT_OTHER)
Admission: RE | Admit: 2013-01-14 | Discharge: 2013-01-14 | Disposition: A | Discharge status: Home / Self Care | Payer: PRIVATE HEALTH INSURANCE | Source: Ambulatory Visit | Attending: Family Medicine | Admitting: Family Medicine

## 2013-01-14 ENCOUNTER — Encounter: Payer: Self-pay | Admitting: Family Medicine

## 2013-01-14 VITALS — BP 130/82 | HR 62 | Wt 165.0 lb

## 2013-01-14 DIAGNOSIS — R7309 Other abnormal glucose: Secondary | ICD-10-CM

## 2013-01-14 DIAGNOSIS — R1011 Right upper quadrant pain: Secondary | ICD-10-CM | POA: Insufficient documentation

## 2013-01-14 DIAGNOSIS — N390 Urinary tract infection, site not specified: Secondary | ICD-10-CM

## 2013-01-14 DIAGNOSIS — R739 Hyperglycemia, unspecified: Secondary | ICD-10-CM

## 2013-01-14 DIAGNOSIS — M549 Dorsalgia, unspecified: Secondary | ICD-10-CM

## 2013-01-14 DIAGNOSIS — E785 Hyperlipidemia, unspecified: Secondary | ICD-10-CM

## 2013-01-14 LAB — CBC WITH DIFFERENTIAL/PLATELET
Eosinophils Absolute: 0.1 10*3/uL (ref 0.0–0.7)
Eosinophils Relative: 2.1 % (ref 0.0–5.0)
Lymphocytes Relative: 35.9 % (ref 12.0–46.0)
MCHC: 33.4 g/dL (ref 30.0–36.0)
MCV: 81.9 fl (ref 78.0–100.0)
Monocytes Absolute: 0.4 10*3/uL (ref 0.1–1.0)
Neutrophils Relative %: 53.5 % (ref 43.0–77.0)
Platelets: 243 10*3/uL (ref 150.0–400.0)
RBC: 4.08 Mil/uL (ref 3.87–5.11)
WBC: 4.5 10*3/uL (ref 4.5–10.5)

## 2013-01-14 LAB — BASIC METABOLIC PANEL
BUN: 10 mg/dL (ref 6–23)
Calcium: 9.3 mg/dL (ref 8.4–10.5)
Chloride: 107 mEq/L (ref 96–112)
Creatinine, Ser: 0.7 mg/dL (ref 0.4–1.2)

## 2013-01-14 LAB — HEPATIC FUNCTION PANEL
Bilirubin, Direct: 0.1 mg/dL (ref 0.0–0.3)
Total Bilirubin: 0.8 mg/dL (ref 0.3–1.2)

## 2013-01-14 LAB — LIPID PANEL
Cholesterol: 180 mg/dL (ref 0–200)
LDL Cholesterol: 119 mg/dL — ABNORMAL HIGH (ref 0–99)
Total CHOL/HDL Ratio: 4
Triglycerides: 76 mg/dL (ref 0.0–149.0)
VLDL: 15.2 mg/dL (ref 0.0–40.0)

## 2013-01-14 LAB — H. PYLORI ANTIBODY, IGG: H Pylori IgG: NEGATIVE

## 2013-01-14 LAB — LIPASE: Lipase: 27 U/L (ref 11.0–59.0)

## 2013-01-14 MED ORDER — FLUCONAZOLE 150 MG PO TABS: 150.0000 mg | ORAL_TABLET | Freq: Once | ORAL | Status: DC

## 2013-01-14 MED ORDER — CIPROFLOXACIN HCL 500 MG PO TABS: 500.0000 mg | ORAL_TABLET | Freq: Two times a day (BID) | ORAL | Status: DC

## 2013-01-14 NOTE — Addendum Note (Signed)
Addended by: Silvio Pate D on: 01/14/2013 12:27 PM   Modules accepted: Orders

## 2013-01-14 NOTE — Patient Instructions (Addendum)
Abdominal Pain  Abdominal pain can be caused by many things. Your caregiver decides the seriousness of your pain by an examination and possibly blood tests and X-rays. Many cases can be observed and treated at home. Most abdominal pain is not caused by a disease and will probably improve without treatment. However, in many cases, more time must pass before a clear cause of the pain can be found. Before that point, it may not be known if you need more testing, or if hospitalization or surgery is needed.  HOME CARE INSTRUCTIONS   · Do not take laxatives unless directed by your caregiver.  · Take pain medicine only as directed by your caregiver.  · Only take over-the-counter or prescription medicines for pain, discomfort, or fever as directed by your caregiver.  · Try a clear liquid diet (broth, tea, or water) for as long as directed by your caregiver. Slowly move to a bland diet as tolerated.  SEEK IMMEDIATE MEDICAL CARE IF:   · The pain does not go away.  · You have a fever.  · You keep throwing up (vomiting).  · The pain is felt only in portions of the abdomen. Pain in the right side could possibly be appendicitis. In an adult, pain in the left lower portion of the abdomen could be colitis or diverticulitis.  · You pass bloody or black tarry stools.  MAKE SURE YOU:   · Understand these instructions.  · Will watch your condition.  · Will get help right away if you are not doing well or get worse.  Document Released: 07/24/2005 Document Revised: 01/06/2012 Document Reviewed: 06/01/2008  Exit[employer]® Patient Information ©2013 [employer].

## 2013-01-14 NOTE — Progress Notes (Signed)
  Subjective:     Debra [name] is a 57 y.o. female who presents for evaluation of abdominal pain. Onset was several weeks ago. Symptoms have been unchanged. The pain is described as sharp.. Pain is located in the RUQ with radiation to right back.  Aggravating factors: none.  Alleviating factors: none. Associated symptoms: none. The patient denies anorexia, arthralagias, belching, chills, constipation, diarrhea, dysuria, fever, flatus, frequency, headache, hematochezia, hematuria, melena, myalgias, nausea, sweats and vomiting.  The patient's history has been marked as reviewed and updated as appropriate.  Review of Systems Pertinent items are noted in HPI.     Objective:    BP 130/82  Pulse 62  Wt 165 lb (74.844 kg)  BMI 27.66 kg/m2  SpO2 96% General appearance: alert, cooperative, appears stated age and no distress Lungs: clear to auscultation bilaterally Heart: S1, S2 normal Abdomen: abnormal findings:  moderate tenderness in the RUQ    Assessment:    Abdominal pain,---RUQ  --? GB    Urine ? uti ---culture and microscopic ordered .    Plan:    The diagnosis was discussed with the patient and evaluation and treatment plans outlined. See orders for lab and imaging studies. Adhere to simple, bland diet. Further follow-up plans will be based on outcome of lab/imaging studies; see orders. Follow up as needed.

## 2013-01-15 LAB — URINALYSIS, MICROSCOPIC ONLY
Bacteria, UA: NONE SEEN
Casts: NONE SEEN
Crystals: NONE SEEN

## 2013-01-16 ENCOUNTER — Ambulatory Visit (HOSPITAL_BASED_OUTPATIENT_CLINIC_OR_DEPARTMENT_OTHER)
Admission: RE | Admit: 2013-01-16 | Discharge: 2013-01-16 | Disposition: A | Discharge status: Home / Self Care | Payer: PRIVATE HEALTH INSURANCE | Source: Ambulatory Visit | Attending: Family Medicine | Admitting: Family Medicine

## 2013-01-16 DIAGNOSIS — Z981 Arthrodesis status: Secondary | ICD-10-CM | POA: Insufficient documentation

## 2013-01-16 DIAGNOSIS — R51 Headache: Secondary | ICD-10-CM | POA: Insufficient documentation

## 2013-01-16 DIAGNOSIS — R413 Other amnesia: Secondary | ICD-10-CM

## 2013-01-16 DIAGNOSIS — R42 Dizziness and giddiness: Secondary | ICD-10-CM | POA: Insufficient documentation

## 2013-01-16 LAB — URINE CULTURE
Colony Count: NO GROWTH
Organism ID, Bacteria: NO GROWTH

## 2013-01-21 ENCOUNTER — Telehealth: Payer: Self-pay

## 2013-01-21 DIAGNOSIS — M5136 Other intervertebral disc degeneration, lumbar region: Secondary | ICD-10-CM

## 2013-01-21 NOTE — Telephone Encounter (Signed)
Message copied by Arnette Norris on Thu Jan 21, 2013  2:57 PM ------      Message from: Lelon Perla      Created: Thu Jan 14, 2013  4:36 PM       + disc space narrowing ----  My need MRI LS spine no contrast ------

## 2013-01-21 NOTE — Telephone Encounter (Signed)
Discussed with patient and she voiced understanding. Orders put in      KP

## 2013-02-08 ENCOUNTER — Other Ambulatory Visit: Payer: Self-pay | Admitting: Family Medicine

## 2013-04-27 ENCOUNTER — Other Ambulatory Visit: Payer: Self-pay | Admitting: Family Medicine

## 2013-06-21 ENCOUNTER — Telehealth: Payer: Self-pay | Admitting: Family Medicine

## 2013-06-21 NOTE — Telephone Encounter (Signed)
Patient Information:  Caller Name: Eliyanah  Phone: [phone]  Patient: Debra [name], Debra [name]  Gender: Female  DOB: [birth year]  Age: 57 Years  PCP: Lelon Perla.  Office Follow Up:  Does the office need to follow up with this patient?: No  Instructions For The Office: N/A   Symptoms  Reason For Call & Symptoms: Patient calling about having dizzy episodes, worse with movement estimated 2 weeks..  Back and leg pain for estimated a month. Eyes/ vision problems estimated one month.  Reports rash on chest onset "after I tried something different".   Emergent symptoms ruled out. See Today in Office per Dizziness guideline due to "Patient wants to be seen.".  Reviewed Health History In EMR: Yes  Reviewed Medications In EMR: Yes  Reviewed Allergies In EMR: Yes  Reviewed Surgeries / Procedures: Yes  Date of Onset of Symptoms: 06/07/2013  Treatments Tried: Cinnamon supplements (thought it may be due to elevated sugar) - some relief at times.  OTC meds for arthritis pain, then Tramadol, then muscle rub with some relief. Eye drops, OTC meds to clean eyes,  Treatments Tried Worked: No  Guideline(s) Used:  Dizziness  Disposition Per Guideline:   See Today in Office  Reason For Disposition Reached:   Patient wants to be seen  Advice Given:  Temporary Dizziness  is usually a harmless symptom. It can be caused by not drinking enough water during sports or hot weather. It can also be caused by skipping a meal, too much sun exposure, standing up suddenly, standing too long in one place or even a viral illness.  Drink Fluids:  Drink several glasses of fruit juice, other clear fluids, or water. This will improve hydration and blood glucose. If you have a fever or have had heat exposure, make sure the fluids are cold.  Cool Off:  If the weather is hot, apply a cold compress to the forehead or take a cool shower or bath.  Rest for 1-2 Hours:  Lie down with feet elevated for 1 hour. This will improve  blood flow and increase blood flow to the brain.  Call Back If:  Still feel dizzy after 2 hours of rest and fluids  Passes out (faints)  You become worse.  Patient Refused Recommendation:  Patient Refused Appt, Patient Requests Appt At Later Date  Wants to make appointment for another date.  Transferred to office for assistance.

## 2013-06-21 NOTE — Telephone Encounter (Signed)
Apt scheduled for 9/2      KP

## 2013-06-29 ENCOUNTER — Encounter: Payer: Self-pay | Admitting: Family Medicine

## 2013-06-29 ENCOUNTER — Ambulatory Visit (INDEPENDENT_AMBULATORY_CARE_PROVIDER_SITE_OTHER): Payer: PRIVATE HEALTH INSURANCE | Admitting: Family Medicine

## 2013-06-29 VITALS — BP 142/90 | HR 73 | Temp 98.2°F | Wt 152.0 lb

## 2013-06-29 DIAGNOSIS — R42 Dizziness and giddiness: Secondary | ICD-10-CM

## 2013-06-29 DIAGNOSIS — R002 Palpitations: Secondary | ICD-10-CM

## 2013-06-29 LAB — CBC WITH DIFFERENTIAL/PLATELET
Basophils Relative: 1.3 % (ref 0.0–3.0)
Eosinophils Relative: 1.9 % (ref 0.0–5.0)
HCT: 33.5 % — ABNORMAL LOW (ref 36.0–46.0)
Hemoglobin: 11.4 g/dL — ABNORMAL LOW (ref 12.0–15.0)
Lymphs Abs: 1.6 10*3/uL (ref 0.7–4.0)
Monocytes Relative: 7.4 % (ref 3.0–12.0)
Platelets: 257 10*3/uL (ref 150.0–400.0)
RBC: 4.1 Mil/uL (ref 3.87–5.11)
WBC: 5.5 10*3/uL (ref 4.5–10.5)

## 2013-06-29 LAB — BASIC METABOLIC PANEL
BUN: 10 mg/dL (ref 6–23)
GFR: 120.43 mL/min (ref >60.00)
Potassium: 3.7 mEq/L (ref 3.5–5.1)
Sodium: 137 mEq/L (ref 135–145)

## 2013-06-29 LAB — LIPID PANEL
LDL Cholesterol: 113 mg/dL — ABNORMAL HIGH (ref 0–99)
VLDL: 15.4 mg/dL (ref 0.0–40.0)

## 2013-06-29 LAB — HEPATIC FUNCTION PANEL
ALT: 13 U/L (ref 0–35)
AST: 18 U/L (ref 0–37)
Total Bilirubin: 0.5 mg/dL (ref 0.3–1.2)

## 2013-06-29 LAB — TSH: TSH: 0.68 u[IU]/mL (ref 0.35–5.50)

## 2013-06-29 LAB — HEMOGLOBIN A1C: Hgb A1c MFr Bld: 6.2 % (ref 4.6–6.5)

## 2013-06-29 NOTE — Progress Notes (Signed)
  Subjective:     Debra [name] is a 57 y.o. female who presents for evaluation of dizziness. The symptoms started 1 month ago and are improved. The attacks occur infrequently and last a few seconds. Positions that worsen symptoms: any motion. Previous workup/treatments: none. Associated ear symptoms: none. Associated CNS symptoms: none. Recent infections: none. Head trauma: denied. Drug ingestion: none. Noise exposure: no occupational exposure. Family history: non-contributory.  The following portions of the patient's history were reviewed and updated as appropriate: allergies, current medications, past family history, past medical history, past social history, past surgical history and problem list.  Review of Systems Pertinent items are noted in HPI.    Objective:    BP 142/90  Pulse 73  Temp(Src) 98.2 F (36.8 C) (Oral)  Wt 152 lb (68.947 kg)  BMI 25.48 kg/m2  SpO2 97% General appearance: alert, cooperative, appears stated age and no distress Eyes: conjunctivae/corneas clear. PERRL, EOM's intact. Fundi benign. Ears: normal TM's and external ear canals both ears Nose: Nares normal. Septum midline. Mucosa normal. No drainage or sinus tenderness. Throat: lips, mucosa, and tongue normal; teeth and gums normal Neck: no adenopathy, no carotid bruit, no JVD, supple, symmetrical, trachea midline and thyroid not enlarged, symmetric, no tenderness/mass/nodules Lungs: clear to auscultation bilaterally Heart: S1, S2 normal Neurologic: Alert and oriented X 3, normal strength and tone. Normal symmetric reflexes. Normal coordination and gait      Assessment:    Vertigo    Plan:    The nature of vertigo syndromes were discussed along with their usual course and treatment. symptoms resolved with PO fluids and cinnamon

## 2013-06-29 NOTE — Patient Instructions (Signed)

## 2013-09-02 ENCOUNTER — Other Ambulatory Visit: Payer: Self-pay

## 2013-09-06 ENCOUNTER — Other Ambulatory Visit: Payer: Self-pay | Admitting: Family Medicine

## 2013-09-17 ENCOUNTER — Encounter: Payer: Self-pay | Admitting: Family Medicine

## 2013-09-28 ENCOUNTER — Ambulatory Visit: Payer: PRIVATE HEALTH INSURANCE | Admitting: Family Medicine

## 2013-09-28 DIAGNOSIS — Z0289 Encounter for other administrative examinations: Secondary | ICD-10-CM

## 2013-10-06 ENCOUNTER — Ambulatory Visit: Payer: PRIVATE HEALTH INSURANCE | Admitting: Family Medicine

## 2013-10-09 ENCOUNTER — Other Ambulatory Visit: Payer: Self-pay | Admitting: Family Medicine

## 2013-10-11 NOTE — Telephone Encounter (Signed)
Last seen 06/29/13 and filled 12/31/12 #30 with 1 refill. Please advise     KP

## 2013-10-12 ENCOUNTER — Ambulatory Visit (INDEPENDENT_AMBULATORY_CARE_PROVIDER_SITE_OTHER): Payer: PRIVATE HEALTH INSURANCE | Admitting: Family Medicine

## 2013-10-12 ENCOUNTER — Encounter: Payer: Self-pay | Admitting: Family Medicine

## 2013-10-12 VITALS — BP 140/90 | HR 78 | Temp 97.9°F | Wt 151.0 lb

## 2013-10-12 DIAGNOSIS — Z23 Encounter for immunization: Secondary | ICD-10-CM

## 2013-10-12 DIAGNOSIS — H81399 Other peripheral vertigo, unspecified ear: Secondary | ICD-10-CM

## 2013-10-12 DIAGNOSIS — F411 Generalized anxiety disorder: Secondary | ICD-10-CM

## 2013-10-12 DIAGNOSIS — R42 Dizziness and giddiness: Secondary | ICD-10-CM

## 2013-10-12 MED ORDER — PAROXETINE HCL 30 MG PO TABS: 30.0000 mg | ORAL_TABLET | Freq: Every day | ORAL | Status: DC

## 2013-10-12 NOTE — Assessment & Plan Note (Signed)
Resolved  rto prn 

## 2013-10-12 NOTE — Progress Notes (Signed)
Pre visit review using our clinic review tool, if applicable. No additional management support is needed unless otherwise documented below in the visit note. 

## 2013-10-12 NOTE — Patient Instructions (Signed)

## 2013-10-12 NOTE — Progress Notes (Signed)
  Subjective:     Debra [name] is a 57 y.o. female who presents for follow up of anxiety disorder and panic attacks. She has the following anxiety symptoms: difficulty concentrating, fatigue, insomnia and panic attacks. Onset of symptoms was approximately several years ago. Symptoms have been gradually worsening since that time. She denies current suicidal and homicidal ideation. Family history significant for no psychiatric illness. Risk factors: negative life event work/school stress. Previous treatment includes Ativan and Celexa. She complains of the following medication side effects: none. The following portions of the patient's history were reviewed and updated as appropriate: allergies, current medications, past family history, past medical history, past social history, past surgical history and problem list.  Review of Systems Pertinent items are noted in HPI.    Objective:    BP 140/90  Pulse 78  Temp(Src) 97.9 F (36.6 C) (Oral)  Wt 151 lb (68.493 kg)  SpO2 96% General appearance: alert, cooperative, appears stated age and no distress Neurologic: Alert and oriented X 3, normal strength and tone. Normal symmetric reflexes. Normal coordination and gait Mental status: Alert, oriented, thought content appropriate, affect: mood-congruent, when questioned about suicide, the patient expresses no suicidal ideation, no homicidal ideation    Assessment:    anxiety disorder and panic attacks. Possible organic contributing causes are: none.   Plan:    Medications: Ativan and Celexa. Labs: no labs indicated at this time. Recommended counseling. Handouts describing disease, natural history, and treatment were given to the patient. Follow up: 3 months. Spent 25 minutes (>50% of visit) discussing the risks of anxiety disorder and panic attacks, the  pathophysiology, etiology, risks, and principles of treatment.

## 2013-12-01 ENCOUNTER — Other Ambulatory Visit: Payer: Self-pay | Admitting: Family Medicine

## 2013-12-07 ENCOUNTER — Telehealth: Payer: Self-pay

## 2013-12-07 MED ORDER — TRAMADOL HCL 50 MG PO TABS: ORAL_TABLET | ORAL | Status: DC

## 2013-12-07 NOTE — Telephone Encounter (Signed)
Rx faxed.    KP 

## 2013-12-07 NOTE — Telephone Encounter (Signed)
Last seen 10/12/13 and filled 04/27/13 #60. Please advise      KP

## 2013-12-07 NOTE — Telephone Encounter (Signed)
Refill x1  3 refills 

## 2014-01-11 ENCOUNTER — Ambulatory Visit (INDEPENDENT_AMBULATORY_CARE_PROVIDER_SITE_OTHER): Payer: PRIVATE HEALTH INSURANCE | Admitting: Family Medicine

## 2014-01-11 ENCOUNTER — Telehealth: Payer: Self-pay | Admitting: Family Medicine

## 2014-01-11 ENCOUNTER — Encounter: Payer: Self-pay | Admitting: Family Medicine

## 2014-01-11 VITALS — BP 140/92 | HR 67 | Temp 97.9°F | Wt 147.0 lb

## 2014-01-11 DIAGNOSIS — Z1239 Encounter for other screening for malignant neoplasm of breast: Secondary | ICD-10-CM

## 2014-01-11 DIAGNOSIS — D649 Anemia, unspecified: Secondary | ICD-10-CM

## 2014-01-11 DIAGNOSIS — R5383 Other fatigue: Secondary | ICD-10-CM

## 2014-01-11 DIAGNOSIS — I10 Essential (primary) hypertension: Secondary | ICD-10-CM

## 2014-01-11 DIAGNOSIS — R5381 Other malaise: Secondary | ICD-10-CM

## 2014-01-11 DIAGNOSIS — M810 Age-related osteoporosis without current pathological fracture: Secondary | ICD-10-CM

## 2014-01-11 DIAGNOSIS — N390 Urinary tract infection, site not specified: Secondary | ICD-10-CM

## 2014-01-11 DIAGNOSIS — G8929 Other chronic pain: Secondary | ICD-10-CM

## 2014-01-11 LAB — HEPATIC FUNCTION PANEL
ALT: 15 U/L (ref 0–35)
AST: 23 U/L (ref 0–37)
Albumin: 4.1 g/dL (ref 3.5–5.2)
Alkaline Phosphatase: 77 U/L (ref 39–117)
BILIRUBIN DIRECT: 0.1 mg/dL (ref 0.0–0.3)
BILIRUBIN TOTAL: 0.7 mg/dL (ref 0.3–1.2)
Total Protein: 7.6 g/dL (ref 6.0–8.3)

## 2014-01-11 LAB — CBC WITH DIFFERENTIAL/PLATELET
BASOS ABS: 0 10*3/uL (ref 0.0–0.1)
Basophils Relative: 0.4 % (ref 0.0–3.0)
EOS ABS: 0.1 10*3/uL (ref 0.0–0.7)
EOS PCT: 1.6 % (ref 0.0–5.0)
HEMATOCRIT: 34.8 % — AB (ref 36.0–46.0)
Hemoglobin: 11.6 g/dL — ABNORMAL LOW (ref 12.0–15.0)
LYMPHS ABS: 1.9 10*3/uL (ref 0.7–4.0)
Lymphocytes Relative: 36.8 % (ref 12.0–46.0)
MCHC: 33.4 g/dL (ref 30.0–36.0)
MCV: 82.3 fl (ref 78.0–100.0)
MONO ABS: 0.4 10*3/uL (ref 0.1–1.0)
Monocytes Relative: 7.3 % (ref 3.0–12.0)
NEUTROS PCT: 53.9 % (ref 43.0–77.0)
Neutro Abs: 2.7 10*3/uL (ref 1.4–7.7)
PLATELETS: 238 10*3/uL (ref 150.0–400.0)
RBC: 4.23 Mil/uL (ref 3.87–5.11)
RDW: 14 % (ref 11.5–14.6)
WBC: 5.1 10*3/uL (ref 4.5–10.5)

## 2014-01-11 LAB — LIPID PANEL
CHOL/HDL RATIO: 3
CHOLESTEROL: 166 mg/dL (ref 0–200)
HDL: 48.8 mg/dL (ref >39.00)
LDL CALC: 103 mg/dL — AB (ref 0–99)
TRIGLYCERIDES: 69 mg/dL (ref 0.0–149.0)
VLDL: 13.8 mg/dL (ref 0.0–40.0)

## 2014-01-11 LAB — BASIC METABOLIC PANEL
BUN: 11 mg/dL (ref 6–23)
CHLORIDE: 104 meq/L (ref 96–112)
CO2: 27 mEq/L (ref 19–32)
CREATININE: 0.6 mg/dL (ref 0.4–1.2)
Calcium: 9.4 mg/dL (ref 8.4–10.5)
GFR: 129.35 mL/min (ref >60.00)
GLUCOSE: 77 mg/dL (ref 70–99)
POTASSIUM: 4 meq/L (ref 3.5–5.1)
Sodium: 139 mEq/L (ref 135–145)

## 2014-01-11 LAB — POCT URINALYSIS DIPSTICK
Bilirubin, UA: NEGATIVE
GLUCOSE UA: NEGATIVE
Ketones, UA: NEGATIVE
NITRITE UA: NEGATIVE
PH UA: 6
Protein, UA: NEGATIVE
RBC UA: NEGATIVE
SPEC GRAV UA: 1.025
UROBILINOGEN UA: 0.2

## 2014-01-11 LAB — IBC PANEL
IRON: 44 ug/dL (ref 42–145)
Saturation Ratios: 14.6 % — ABNORMAL LOW (ref 20.0–50.0)
TRANSFERRIN: 215 mg/dL (ref 212.0–360.0)

## 2014-01-11 LAB — TSH: TSH: 0.3 u[IU]/mL — ABNORMAL LOW (ref 0.35–5.50)

## 2014-01-11 MED ORDER — GABAPENTIN 600 MG PO TABS: 600.0000 mg | ORAL_TABLET | Freq: Three times a day (TID) | ORAL | Status: DC

## 2014-01-11 MED ORDER — TRAMADOL HCL 50 MG PO TABS: ORAL_TABLET | ORAL | Status: DC

## 2014-01-11 MED ORDER — METOPROLOL SUCCINATE ER 50 MG PO TB24: ORAL_TABLET | ORAL | Status: DC

## 2014-01-11 MED ORDER — FERROUS SULFATE CR 160 (50 FE) MG PO TBCR: 160.0000 mg | EXTENDED_RELEASE_TABLET | Freq: Every day | ORAL | Status: DC

## 2014-01-11 NOTE — Patient Instructions (Signed)

## 2014-01-11 NOTE — Progress Notes (Signed)
Patient ID: Debra [name], female   DOB: 1956/03/05, 58 y.o.   MRN: [id number]   Subjective:    Patient ID: Debra [name], female    DOB: [birth year], 58 y.o.   MRN: [id number] HPI Pt here for f/u anxiety, chronic pain         Objective:     BP 140/92  Pulse 67  Temp(Src) 97.9 F (36.6 C) (Oral)  Wt 147 lb (66.679 kg)  SpO2 98% General appearance: alert, cooperative, appears stated age and no distress Ears: normal TM's and external ear canals both ears Nose: Nares normal. Septum midline. Mucosa normal. No drainage or sinus tenderness. Throat: lips, mucosa, and tongue normal; teeth and gums normal Neck: no adenopathy, supple, symmetrical, trachea midline and thyroid not enlarged, symmetric, no tenderness/mass/nodules Lungs: clear to auscultation bilaterally Heart: regular rate and rhythm, S1, S2 normal, no murmur, click, rub or gallop Extremities: extremities normal, atraumatic, no cyanosis or edema       Assessment & Plan:  1. Chronic pain con't meds - gabapentin (NEURONTIN) 600 MG tablet; Take 1 tablet (600 mg total) by mouth 3 (three) times daily.  Dispense: 90 tablet; Refill: 3 - traMADol (ULTRAM) 50 MG tablet; TAKE ONE TO TWO TABLETS BY MOUTH EVERY 6 HOURS AS NEEDED  Dispense: 60 tablet; Refill: 3  2. Anemia Check labs - ferrous sulfate dried (SLOW FE) 160 (50 FE) MG TBCR SR tablet; Take 1 tablet (160 mg total) by mouth daily.  Dispense: 90 tablet; Refill: 3 - CBC with Differential - POCT urinalysis dipstick - TSH - IBC panel  3. HTN (hypertension) stable - metoprolol succinate (TOPROL-XL) 50 MG 24 hr tablet; TAKE ONE TABLET BY MOUTH TWICE DAILY  Dispense: 60 tablet; Refill: 5 - Basic metabolic panel - Hepatic function panel - Lipid panel - POCT urinalysis dipstick - TSH  4. Osteoporosis, unspecified Check bmd - DG Bone Density; Future  5. Other screening breast examination  - MM DIGITAL SCREENING BILATERAL; Future  6. Fatigue Check labs -  TSH  7. Urinary tract infection, site not specified  - Urine Culture

## 2014-01-11 NOTE — Progress Notes (Signed)
Pre visit review using our clinic review tool, if applicable. No additional management support is needed unless otherwise documented below in the visit note. 

## 2014-01-11 NOTE — Telephone Encounter (Signed)
Relevant patient education assigned to patient using Emmi. ° °

## 2014-01-12 ENCOUNTER — Telehealth: Payer: Self-pay | Admitting: Family Medicine

## 2014-01-12 NOTE — Telephone Encounter (Signed)
Relevant patient education assigned to patient using Emmi. ° °

## 2014-01-13 LAB — URINE CULTURE

## 2014-01-14 MED ORDER — CIPROFLOXACIN HCL 500 MG PO TABS: 500.0000 mg | ORAL_TABLET | Freq: Two times a day (BID) | ORAL | Status: DC

## 2014-02-08 ENCOUNTER — Encounter: Payer: Self-pay | Admitting: Family Medicine

## 2014-06-13 ENCOUNTER — Other Ambulatory Visit: Payer: Self-pay | Admitting: Family Medicine

## 2014-07-08 ENCOUNTER — Telehealth: Payer: Self-pay | Admitting: Family Medicine

## 2014-07-08 NOTE — Telephone Encounter (Signed)
Caller name: Ghalia Relation to pt: self Call back number: [phone] Pharmacy:  Reason for call:   Patient wants to know what her blood type

## 2014-07-08 NOTE — Telephone Encounter (Signed)
I made the patient aware that we do not do blood typing, she would need to contact her GYN, previous surgeon or [employer] [facility] to donate blood and they will give her the blood type. She voiced understanding.      KP

## 2014-08-31 ENCOUNTER — Emergency Department (HOSPITAL_BASED_OUTPATIENT_CLINIC_OR_DEPARTMENT_OTHER): Payer: PRIVATE HEALTH INSURANCE

## 2014-08-31 ENCOUNTER — Ambulatory Visit (INDEPENDENT_AMBULATORY_CARE_PROVIDER_SITE_OTHER): Payer: PRIVATE HEALTH INSURANCE | Admitting: Medical

## 2014-08-31 ENCOUNTER — Encounter: Payer: Self-pay | Admitting: Medical

## 2014-08-31 ENCOUNTER — Encounter (HOSPITAL_BASED_OUTPATIENT_CLINIC_OR_DEPARTMENT_OTHER): Payer: Self-pay

## 2014-08-31 ENCOUNTER — Emergency Department (HOSPITAL_BASED_OUTPATIENT_CLINIC_OR_DEPARTMENT_OTHER)
Admission: EM | Admit: 2014-08-31 | Discharge: 2014-08-31 | Disposition: A | Discharge status: Home / Self Care | Payer: PRIVATE HEALTH INSURANCE | Attending: Emergency Medicine | Admitting: Emergency Medicine

## 2014-08-31 VITALS — BP 165/97 | HR 96 | Temp 98.2°F | Ht 64.2 in | Wt 149.4 lb

## 2014-08-31 DIAGNOSIS — F419 Anxiety disorder, unspecified: Secondary | ICD-10-CM | POA: Insufficient documentation

## 2014-08-31 DIAGNOSIS — Z638 Other specified problems related to primary support group: Secondary | ICD-10-CM | POA: Diagnosis not present

## 2014-08-31 DIAGNOSIS — R079 Chest pain, unspecified: Secondary | ICD-10-CM | POA: Diagnosis not present

## 2014-08-31 DIAGNOSIS — E039 Hypothyroidism, unspecified: Secondary | ICD-10-CM | POA: Diagnosis not present

## 2014-08-31 DIAGNOSIS — M62838 Other muscle spasm: Secondary | ICD-10-CM | POA: Diagnosis not present

## 2014-08-31 DIAGNOSIS — Z9889 Other specified postprocedural states: Secondary | ICD-10-CM | POA: Insufficient documentation

## 2014-08-31 DIAGNOSIS — K219 Gastro-esophageal reflux disease without esophagitis: Secondary | ICD-10-CM | POA: Insufficient documentation

## 2014-08-31 DIAGNOSIS — M62830 Muscle spasm of back: Secondary | ICD-10-CM

## 2014-08-31 DIAGNOSIS — R0789 Other chest pain: Secondary | ICD-10-CM

## 2014-08-31 DIAGNOSIS — Z792 Long term (current) use of antibiotics: Secondary | ICD-10-CM | POA: Diagnosis not present

## 2014-08-31 DIAGNOSIS — M546 Pain in thoracic spine: Secondary | ICD-10-CM | POA: Diagnosis not present

## 2014-08-31 DIAGNOSIS — M549 Dorsalgia, unspecified: Secondary | ICD-10-CM | POA: Diagnosis present

## 2014-08-31 DIAGNOSIS — R29898 Other symptoms and signs involving the musculoskeletal system: Secondary | ICD-10-CM

## 2014-08-31 DIAGNOSIS — Z79899 Other long term (current) drug therapy: Secondary | ICD-10-CM | POA: Insufficient documentation

## 2014-08-31 DIAGNOSIS — M542 Cervicalgia: Secondary | ICD-10-CM

## 2014-08-31 DIAGNOSIS — Z720 Tobacco use: Secondary | ICD-10-CM | POA: Insufficient documentation

## 2014-08-31 DIAGNOSIS — F439 Reaction to severe stress, unspecified: Secondary | ICD-10-CM

## 2014-08-31 HISTORY — DX: Other chest pain: R07.89

## 2014-08-31 LAB — CBC WITH DIFFERENTIAL/PLATELET
BASOS ABS: 0 10*3/uL (ref 0.0–0.1)
Basophils Relative: 0 % (ref 0–1)
EOS ABS: 0 10*3/uL (ref 0.0–0.7)
EOS PCT: 1 % (ref 0–5)
HEMATOCRIT: 34.2 % — AB (ref 36.0–46.0)
Hemoglobin: 11.4 g/dL — ABNORMAL LOW (ref 12.0–15.0)
LYMPHS ABS: 2.1 10*3/uL (ref 0.7–4.0)
LYMPHS PCT: 31 % (ref 12–46)
MCH: 27.9 pg (ref 26.0–34.0)
MCHC: 33.3 g/dL (ref 30.0–36.0)
MCV: 83.8 fL (ref 78.0–100.0)
MONO ABS: 0.6 10*3/uL (ref 0.1–1.0)
Monocytes Relative: 8 % (ref 3–12)
Neutro Abs: 4 10*3/uL (ref 1.7–7.7)
Neutrophils Relative %: 60 % (ref 43–77)
Platelets: 246 10*3/uL (ref 150–400)
RBC: 4.08 MIL/uL (ref 3.87–5.11)
RDW: 13 % (ref 11.5–15.5)
WBC: 6.7 10*3/uL (ref 4.0–10.5)

## 2014-08-31 LAB — COMPREHENSIVE METABOLIC PANEL
ALT: 13 U/L (ref 0–35)
AST: 18 U/L (ref 0–37)
Albumin: 3.7 g/dL (ref 3.5–5.2)
Alkaline Phosphatase: 99 U/L (ref 39–117)
Anion gap: 11 (ref 5–15)
BUN: 8 mg/dL (ref 6–23)
CALCIUM: 9.4 mg/dL (ref 8.4–10.5)
CO2: 25 mEq/L (ref 19–32)
CREATININE: 0.5 mg/dL (ref 0.50–1.10)
Chloride: 106 mEq/L (ref 96–112)
GFR calc non Af Amer: >90 mL/min (ref >90)
GLUCOSE: 78 mg/dL (ref 70–99)
Potassium: 3.8 mEq/L (ref 3.7–5.3)
Sodium: 142 mEq/L (ref 137–147)
TOTAL PROTEIN: 7.6 g/dL (ref 6.0–8.3)
Total Bilirubin: 0.2 mg/dL — ABNORMAL LOW (ref 0.3–1.2)

## 2014-08-31 LAB — TROPONIN I

## 2014-08-31 MED ORDER — OXYCODONE-ACETAMINOPHEN 5-325 MG PO TABS: 1.0000 | ORAL_TABLET | ORAL | Status: DC | PRN

## 2014-08-31 MED ORDER — KETOROLAC TROMETHAMINE 30 MG/ML IJ SOLN
30.0000 mg | Freq: Once | INTRAMUSCULAR | Status: AC
  Administered 2014-08-31: 30 mg via INTRAVENOUS
  Filled 2014-08-31: qty 1

## 2014-08-31 MED ORDER — DIAZEPAM 5 MG PO TABS: 5.0000 mg | ORAL_TABLET | Freq: Four times a day (QID) | ORAL | Status: DC | PRN

## 2014-08-31 NOTE — Assessment & Plan Note (Signed)
Actually of lt gip per pt and this is barely appreciable on exam Initially I thought would order CT of her head but on review of her chart I decided to refer her back to neurosurgeon.

## 2014-08-31 NOTE — ED Notes (Signed)
Pt c/o upper back pain that "comes through to my chest" x 1 month-was sent from PCP in building-denies CP at this time-back pain 7/10

## 2014-08-31 NOTE — Assessment & Plan Note (Signed)
Pt has back pain with some chest pain today. Active so sent down to ED. The work up was negative. I reviewed note and they discharge her.

## 2014-08-31 NOTE — Assessment & Plan Note (Signed)
Described to me and then told ED as well. ED gave her valium and will see how she responds.

## 2014-08-31 NOTE — Assessment & Plan Note (Signed)
Some in her neck as well. ED gave pt percocet. When she runs out of this, I would be willing to give tramadol and low dose diclofenac. But seeing that on percocet will defer writing meds until she is finished with percocet.

## 2014-08-31 NOTE — Patient Instructions (Addendum)
For your back pain and upper thorax pain/chest pain, I want you to go down to ED for probable cxr and troponin. This is necessary since pain is active and I do not have reason for the pain. I can't do a troponin study in our office.  I can write diclofenac for pain and tramadol  if pain is related to your osteoarthritis but you still need troponin.Note I did review ED note and they prescribed percocet and valium. After she finishes with these then I would call in tramadol and diclofenac.  Also for your slight rt upper extremity weakness which you state happened 2 wks ago. This may be related to your neck surgery. But I will go ahead and try to schedule outpatient ct of head over next 3-5 days.(after reviewing the chart decided to refer pt to neurosurgeon) If you get any other neurologic symptoms that are new then ED evaluation.  Take your bp medication daily.  Follow up in 7 days or as needed.

## 2014-08-31 NOTE — ED Notes (Signed)
MD at bedside. 

## 2014-08-31 NOTE — Discharge Instructions (Signed)
Take motrin 600 mg every 6 hrs for pain.   Take percocet for severe pain. Take valium for spasms. Do NOT drive with these meds.   Follow up with your neurosurgeon and cardiologist.   Return to ER if you have severe pain, worse numbness, weakness.

## 2014-08-31 NOTE — Progress Notes (Signed)
Pre visit review using our clinic review tool, if applicable. No additional management support is needed unless otherwise documented below in the visit note. 

## 2014-08-31 NOTE — ED Provider Notes (Signed)
CSN: [id number]     Arrival date & time 08/31/14  1630 History   First MD Initiated Contact with Patient 08/31/14 1704     Chief Complaint  Patient presents with  . Back Pain     (Consider location/radiation/quality/duration/timing/severity/associated sxs/prior Treatment) The history is provided by the patient.  Debra [name] is a 58 y.o. female hx of GERD, anxiety here with back pain, chest pain. She has been feeling anxious and has been very stressed out at work for the last several weeks. Has been having intermittent back pain as well as left-sided chest pain for the last month. Described the pain as cramping and constant. Not associated with shortness of breath. Not exertional or worse with movement. Denies hx of DVT or PE or CAD.    Past Medical History  Diagnosis Date  . GERD (gastroesophageal reflux disease)   . Anxiety   . Thyroid disease     Hypothyroidism  . Mixed connective tissue disease     with Raynaud's  . DDD (degenerative disc disease)    Past Surgical History  Procedure Laterality Date  . Carpal tunnel release      Bilateral  . Leg surgery      Right   . Cervical spine surgery      x3   Family History  Problem Relation Age of Onset  . Colon cancer Maternal Grandfather   . Breast cancer Mother   . Arthritis Mother     rheumatoid  . Cancer Mother     breast  . Heart disease Mother 36    MI  . Heart disease Father     MI  . Hypertension Father   . Stroke Father   . Diabetes Sister   . Hypertension Sister   . Hyperlipidemia Sister    History  Substance Use Topics  . Smoking status: Current Every Day Smoker -- 1.00 packs/day    Types: Cigarettes  . Smokeless tobacco: Not on file  . Alcohol Use: Yes   OB History    No data available     Review of Systems  Cardiovascular: Positive for chest pain.  Musculoskeletal: Positive for back pain.  All other systems reviewed and are negative.     Allergies  Codeine  Home Medications    Prior to Admission medications   Medication Sig Start Date End Date Taking? Authorizing Provider  ciprofloxacin (CIPRO) 500 MG tablet Take 1 tablet (500 mg total) by mouth 2 (two) times daily. 01/14/14   Rosalita Chessman, DO  donepezil (ARICEPT) 5 MG tablet TAKE ONE TABLET BY MOUTH AT BEDTIME AS NEEDED 09/06/13   Rosalita Chessman, DO  Fe Fum-Vit C-Vit B12-FA (TRIGELS-F) 460-60-0.01-1 MG CAPS Take 1 capsule by mouth daily. 07/11/11   Rosalita Chessman, DO  ferrous sulfate dried (SLOW FE) 160 (50 FE) MG TBCR SR tablet Take 1 tablet (160 mg total) by mouth daily. 01/11/14   Rosalita Chessman, DO  gabapentin (NEURONTIN) 600 MG tablet Take 1 tablet (600 mg total) by mouth 3 (three) times daily. 01/11/14 04/11/16  Rosalita Chessman, DO  levothyroxine (SYNTHROID, LEVOTHROID) 75 MCG tablet 1 tab by mouth once daily--labs are due now 06/13/14   Rosalita Chessman, DO  LORazepam (ATIVAN) 0.5 MG tablet TAKE ONE TABLET BY MOUTH THREE TIMES DAILY AS NEEDED 10/09/13   Rosalita Chessman, DO  metoprolol succinate (TOPROL-XL) 50 MG 24 hr tablet TAKE ONE TABLET BY MOUTH TWICE DAILY 01/11/14   Rosalita Chessman,  DO  Misc Natural Products (7-KETO LEAN) CAPS Take 1 capsule by mouth daily.    Historical Provider, MD  PARoxetine (PAXIL) 30 MG tablet Take 1 tablet (30 mg total) by mouth daily. 10/12/13   Rosalita Chessman, DO  ranitidine (ZANTAC) 150 MG capsule TAKE ONE CAPSULE BY MOUTH TWICE DAILY 04/27/13   Rosalita Chessman, DO  traMADol (ULTRAM) 50 MG tablet TAKE ONE TO TWO TABLETS BY MOUTH EVERY 6 HOURS AS NEEDED 01/11/14   Alferd Apa Lowne, DO   BP 174/100 mmHg  Pulse 71  Temp(Src) 98.5 F (36.9 C) (Oral)  Resp 18  Ht 5\' 5"  (1.651 m)  Wt 149 lb (67.586 kg)  BMI 24.79 kg/m2  SpO2 100% Physical Exam  Constitutional: She is oriented to person, place, and time.  Anxious   HENT:  Head: Normocephalic.  Mouth/Throat: Oropharynx is clear and moist.  Eyes: Conjunctivae and EOM are normal. Pupils are equal, round, and reactive to light.  Neck: Neck  supple.  + diffuse paracervical tenderness and spasms   Cardiovascular: Normal rate, regular rhythm and normal heart sounds.   Pulmonary/Chest: Effort normal and breath sounds normal. No respiratory distress. She has no wheezes. She has no rales. She exhibits no tenderness.  Abdominal: Soft. Bowel sounds are normal. She exhibits no distension. There is no tenderness. There is no rebound.  Musculoskeletal: Normal range of motion. She exhibits no edema or tenderness.  + diffuse parathoracic tenderness   Neurological: She is alert and oriented to person, place, and time. No cranial nerve deficit. Coordination normal.  Skin: Skin is warm and dry.  Psychiatric: She has a normal mood and affect. Her behavior is normal. Judgment and thought content normal.  Nursing note and vitals reviewed.   ED Course  Procedures (including critical care time) Labs Review Labs Reviewed  CBC WITH DIFFERENTIAL - Abnormal; Notable for the following:    Hemoglobin 11.4 (*)    HCT 34.2 (*)    All other components within normal limits  COMPREHENSIVE METABOLIC PANEL - Abnormal; Notable for the following:    Total Bilirubin 0.2 (*)    All other components within normal limits  TROPONIN I    Imaging Review Dg Chest 2 View  08/31/2014   CLINICAL DATA:  Upper back pain for 1 month. Pain radiates to chest with right arm numbness.  EXAM: CHEST  2 VIEW  COMPARISON:  11/06/2006.  FINDINGS: Trachea is midline. Surgical clips are seen in the expected location of the left thyroid. Heart size normal. Lungs are clear. No pleural fluid. Osseous structures are grossly unremarkable.  IMPRESSION: No acute findings.   Electronically Signed   By: Lorin Picket M.D.   On: 08/31/2014 17:47     EKG Interpretation   Date/Time:  Wednesday August 31 2014 16:49:52 EST Ventricular Rate:  67 PR Interval:  170 QRS Duration: 82 QT Interval:  400 QTC Calculation: 422 R Axis:   -11 Text Interpretation:  Normal sinus rhythm Septal  infarct , age  undetermined Abnormal ECG No significant change since last tracing  Confirmed by Camara Renstrom  MD, Eleaner Dibartolo ([id number]) on 08/31/2014 5:05:49 PM      MDM   Final diagnoses:  Chest pain   Debra [name] is a 58 y.o. female here with chest pain for a month. i think likely muscle spasms. I doubt ACS or PE. Will get cxr, labs. Will prescribe pain meds and muscle relaxants.   6:35 PM Xray unremarkable. Trop neg x 1 (symptoms for  a month). She drove here so I prescribed percocet, valium. She also mentioned that she has intermittent R arm numbness and dec grip strength and had previous cervical surgery. On exam currently, nl grip strength, nl sensation. + paracervical tenderness but no midline tenderness. I doubt spinal compression. Can try meds first and will refer back to neurosurgery. No need for MRI emergently.     Wandra Arthurs, MD 08/31/14 [phone]

## 2014-08-31 NOTE — Progress Notes (Signed)
Subjective:    Patient ID: Debra [name], female    DOB: September 24, 1956, 58 y.o.   MRN: [id number]  HPI   Pt in states she has diffuse upper thorax pain. She points to upper and lower thorax. The worse pain is in upper back. Pt states hurts to breath and moving also hurts. Pt states she had this in past but she this pain is all the sudden worse.  Some recent belching.   Pt does have history of osteoarthritis.  Pt clarifies that she has had pain in her back but never had pain that extended to anterior thorax.  Pain in both front and back present for 2 weeks.   Pt not reporting any jaw pain or lt arm pain. No diaphoresis. No sob.   Pt also has weakness of her rt arm that started for 2 weeks. Pt does have neck pain chronically. Hx of neck surgery. Pt feels that he neck has stiff feeling. Pt does not remember who did surgery for her neck.  Pt moves her rt arm normally. No ha or gross motor function deficits.  Pt did not take her bp medication(metoprolol) until a few minutes ago before.   Past Medical History  Diagnosis Date  . GERD (gastroesophageal reflux disease)   . Anxiety   . Thyroid disease     Hypothyroidism  . Mixed connective tissue disease     with Raynaud's  . DDD (degenerative disc disease)     History   Social History  . Marital Status: Legally Separated    Spouse Name: N/A    Number of Children: 3  . Years of Education: N/A   Occupational History  . SECURITY GUARD    Social History Main Topics  . Smoking status: Current Every Day Smoker -- 1.00 packs/day    Types: Cigarettes  . Smokeless tobacco: Not on file  . Alcohol Use: Yes  . Drug Use: No  . Sexual Activity:    Partners: Male    Birth Control/ Protection: Post-menopausal   Other Topics Concern  . Not on file   Social History Narrative    Past Surgical History  Procedure Laterality Date  . Carpal tunnel release      Bilateral  . Leg surgery      Right   . Cervical spine surgery      x3     Family History  Problem Relation Age of Onset  . Colon cancer Maternal Grandfather   . Breast cancer Mother   . Arthritis Mother     rheumatoid  . Cancer Mother     breast  . Heart disease Mother 69    MI  . Heart disease Father     MI  . Hypertension Father   . Stroke Father   . Diabetes Sister   . Hypertension Sister   . Hyperlipidemia Sister     Allergies  Allergen Reactions  . Codeine     Current Outpatient Prescriptions on File Prior to Visit  Medication Sig Dispense Refill  . ferrous sulfate dried (SLOW FE) 160 (50 FE) MG TBCR SR tablet Take 1 tablet (160 mg total) by mouth daily. 90 tablet 3  . gabapentin (NEURONTIN) 600 MG tablet Take 1 tablet (600 mg total) by mouth 3 (three) times daily. 90 tablet 3  . levothyroxine (SYNTHROID, LEVOTHROID) 75 MCG tablet 1 tab by mouth once daily--labs are due now 90 tablet 0  . metoprolol succinate (TOPROL-XL) 50 MG 24 hr tablet TAKE  ONE TABLET BY MOUTH TWICE DAILY 60 tablet 5  . Misc Natural Products (7-KETO LEAN) CAPS Take 1 capsule by mouth daily.    . ranitidine (ZANTAC) 150 MG capsule TAKE ONE CAPSULE BY MOUTH TWICE DAILY 60 capsule 5  . ciprofloxacin (CIPRO) 500 MG tablet Take 1 tablet (500 mg total) by mouth 2 (two) times daily. 10 tablet 0  . donepezil (ARICEPT) 5 MG tablet TAKE ONE TABLET BY MOUTH AT BEDTIME AS NEEDED 30 tablet 5  . Fe Fum-Vit C-Vit B12-FA (TRIGELS-F) 460-60-0.01-1 MG CAPS Take 1 capsule by mouth daily. 30 capsule 5  . LORazepam (ATIVAN) 0.5 MG tablet TAKE ONE TABLET BY MOUTH THREE TIMES DAILY AS NEEDED 30 tablet 0  . PARoxetine (PAXIL) 30 MG tablet Take 1 tablet (30 mg total) by mouth daily. 90 tablet 3  . traMADol (ULTRAM) 50 MG tablet TAKE ONE TO TWO TABLETS BY MOUTH EVERY 6 HOURS AS NEEDED 60 tablet 3   No current facility-administered medications on file prior to visit.    BP 165/97 mmHg  Pulse 96  Temp(Src) 98.2 F (36.8 C) (Oral)  Ht 5' 4.2" (1.631 m)  Wt 149 lb 6.4 oz (67.767 kg)  BMI  25.47 kg/m2  SpO2 99%       Review of Systems  Constitutional: Negative for fever, chills and diaphoresis.  HENT: Negative.   Respiratory: Negative for cough, chest tightness, shortness of breath and wheezing.   Cardiovascular: Positive for chest pain and palpitations.  Gastrointestinal: Negative.   Musculoskeletal:       Diffuse upper posterior thorax and anterior thorax pain. At time pain left side of chest shoots to her back. This is transient and rare but she reported this while she was in our office.  Some neck pain as well.  Neurological: Negative for dizziness, syncope, facial asymmetry, speech difficulty, weakness, light-headedness, numbness and headaches.       Rt upper extremity grip strength feels weaker slightly over last 2 weeks.  Hematological: Negative for adenopathy. Does not bruise/bleed easily.  Psychiatric/Behavioral: Negative for behavioral problems, sleep disturbance, self-injury, dysphoric mood, decreased concentration and agitation. The patient is nervous/anxious.        Describes feeling very tense and stressed.       Objective:   Physical Exam  Constitutional: She is oriented to person, place, and time. She appears well-developed and well-nourished. No distress.  Seems generally stressed.   HENT:  Head: Normocephalic and atraumatic.  Eyes: Conjunctivae and EOM are normal. Pupils are equal, round, and reactive to light.  Neck: Normal range of motion. Neck supple.  Cardiovascular: Normal rate, regular rhythm and normal heart sounds.  Exam reveals no gallop and no friction rub.   No murmur heard. Pulmonary/Chest: Effort normal and breath sounds normal. No respiratory distress. She has no wheezes. She has no rales. She exhibits no tenderness.  Abdominal: Soft. Bowel sounds are normal. She exhibits no distension and no mass. There is no tenderness. There is no rebound and no guarding.  Musculoskeletal:  Back- upper back mild tenderness to palpation trapezius  and area between the spine.   Lower ext- negative homans signs.   Anterior chest- no pain on palpation.  Neurological: She is alert and oriented to person, place, and time. No cranial nerve deficit. Coordination normal.  CN III- XII grossly intact. Neg romberg. No gross motor or sensory function obvious deficits. rt grip strength compared to lt maybe mild decrease.  Skin: She is not diaphoretic.  Psychiatric: She has a  normal mood and affect. Her behavior is normal. Judgment and thought content normal.    Lt breast- she wanted to show me faint small bruised area abve nipple. Barely noticeable. No masses or lumbs. Axillary area clear from lymph nodes(Got Janett Billow MA to come in quickly before pt showed me the area)          Assessment & Plan:  Regarding pt breast. No obvious abnormality. I did advise schedule wellness exam in one month. Then we would order screening mammogram. Pt agreed that she would make that appointment.

## 2014-09-01 ENCOUNTER — Telehealth: Payer: Self-pay | Admitting: Family Medicine

## 2014-09-01 NOTE — Telephone Encounter (Signed)
Caller name: Esma Relation to pt: self Call back number: [phone] Pharmacy:  Reason for call:   Patient states that she was sent to the ED from our office yesterday and is supposed to be following up with [facility] next week. She wants to know if we can place referral

## 2014-09-01 NOTE — Telephone Encounter (Signed)
Error. Referral has already been placed to [place] [facility]

## 2014-09-16 ENCOUNTER — Telehealth: Payer: Self-pay | Admitting: Family Medicine

## 2014-09-16 DIAGNOSIS — E0590 Thyrotoxicosis, unspecified without thyrotoxic crisis or storm: Secondary | ICD-10-CM

## 2014-09-16 DIAGNOSIS — I10 Essential (primary) hypertension: Secondary | ICD-10-CM

## 2014-09-16 MED ORDER — LEVOTHYROXINE SODIUM 75 MCG PO TABS: ORAL_TABLET | ORAL | Status: DC

## 2014-09-16 MED ORDER — METOPROLOL SUCCINATE ER 50 MG PO TB24: ORAL_TABLET | ORAL | Status: DC

## 2014-09-16 NOTE — Telephone Encounter (Signed)
Caller name: [name], [name] Relation to pt: self  Call back number: [phone] Pharmacy: [facility] [phone]  Reason for call:   Pt states she may have dropped a couple of  levothyroxine tablets and states she is completley out, requesting a refill today in addition to metoprolol succinate (TOPROL-XL) 50 MG 24 hr tablet.

## 2014-09-16 NOTE — Telephone Encounter (Signed)
Patient needs a lab visit to recheck her Thyroid. Please schedule      KP

## 2014-09-20 ENCOUNTER — Telehealth: Payer: Self-pay

## 2014-09-20 ENCOUNTER — Other Ambulatory Visit (INDEPENDENT_AMBULATORY_CARE_PROVIDER_SITE_OTHER): Payer: PRIVATE HEALTH INSURANCE

## 2014-09-20 DIAGNOSIS — E0590 Thyrotoxicosis, unspecified without thyrotoxic crisis or storm: Secondary | ICD-10-CM

## 2014-09-20 LAB — TSH: TSH: 0.72 u[IU]/mL (ref 0.35–4.50)

## 2014-09-20 LAB — T4, FREE: Free T4: 1 ng/dL (ref 0.60–1.60)

## 2014-09-20 LAB — T3, FREE: T3, Free: 2.2 pg/mL — ABNORMAL LOW (ref 2.3–4.2)

## 2014-09-20 NOTE — Telephone Encounter (Signed)
Orders in..     KP 

## 2014-09-20 NOTE — Telephone Encounter (Signed)
Pt scheduled lab work today 09/20/14  At 1:15pm, requesting orders

## 2014-09-20 NOTE — Telephone Encounter (Signed)
Kriti Katayama [phone]  Laelle is needing to know where she was referred to for counciling several months ago.

## 2014-09-21 NOTE — Telephone Encounter (Signed)
Spoke with patient and made her aware that I am not sure who she seen in the past b/c we do not have any records, she will need to either look at home for documentation or she can try to schedule with one of the providers on the list. She voiced understanding.     KP

## 2014-10-15 ENCOUNTER — Other Ambulatory Visit: Payer: Self-pay | Admitting: Family Medicine

## 2014-10-19 ENCOUNTER — Telehealth: Payer: Self-pay | Admitting: Family Medicine

## 2014-10-19 NOTE — Telephone Encounter (Signed)
Caller name: Abella Relation to pt: Call back number: [phone] Pharmacy: [facility] on [address]  Reason for call:   Patient called in stating that she received a mychart message from [place] stating that she had a uti and that something was going to be called in. The pharmacy is telling her that nothing has been called in.

## 2014-10-20 NOTE — Telephone Encounter (Signed)
Pt seen a mychart message from March and was worried she never received the medications. Pt was advised that the medication had been mailed to her house at that time. Pt states she has been drinking cranberry juice since she read the message but does not have any painful urination or discharge. Pt advised if any symptoms develop she could call our office on Monday to schedule an appt or Saturday clinic.

## 2014-11-01 ENCOUNTER — Telehealth: Payer: Self-pay | Admitting: Family Medicine

## 2014-11-01 NOTE — Telephone Encounter (Signed)
Caller name: Othella Relation to pt: Call back [place]:  Reason for call: Pt called in stating had an urine infection ever since a few months ago and needs rx for cipro, pt also states has not been able to sleep for months and needs to have rx. Please advice

## 2014-11-01 NOTE — Telephone Encounter (Signed)
She needs an apt.     KP

## 2014-11-02 NOTE — Telephone Encounter (Signed)
Called pt and scheduled appt 11-07-14.

## 2014-11-07 ENCOUNTER — Ambulatory Visit (INDEPENDENT_AMBULATORY_CARE_PROVIDER_SITE_OTHER): Payer: 59 | Admitting: Family Medicine

## 2014-11-07 ENCOUNTER — Encounter: Payer: Self-pay | Admitting: Family Medicine

## 2014-11-07 VITALS — BP 132/82 | HR 90 | Temp 98.5°F | Wt 144.0 lb

## 2014-11-07 DIAGNOSIS — N390 Urinary tract infection, site not specified: Secondary | ICD-10-CM

## 2014-11-07 DIAGNOSIS — M2550 Pain in unspecified joint: Secondary | ICD-10-CM

## 2014-11-07 DIAGNOSIS — Z0000 Encounter for general adult medical examination without abnormal findings: Secondary | ICD-10-CM

## 2014-11-07 LAB — RHEUMATOID FACTOR

## 2014-11-07 LAB — POCT URINALYSIS DIPSTICK
BILIRUBIN UA: NEGATIVE
Blood, UA: NEGATIVE
Glucose, UA: NEGATIVE
Ketones, UA: NEGATIVE
LEUKOCYTES UA: NEGATIVE
NITRITE UA: NEGATIVE
Protein, UA: NEGATIVE
Spec Grav, UA: >=1.03
Urobilinogen, UA: 1
pH, UA: 6

## 2014-11-07 NOTE — Patient Instructions (Signed)

## 2014-11-07 NOTE — Progress Notes (Signed)
[id number] Lowne, DO Chief Complaint  Patient presents with  . Dysuria    and cloudy urine x's a few months (Never took the Cipro)  . Follow-up    Numerous complaints and the patient has been made aware to schedule a CPE    Current Issues:  Presents with several  days of dysuria, urinary urgency and urinary frequency Associated symptoms include:  chills, cloudy urine, flank pain bilaterally, lower abdominal pain, urinary frequency and urinary hesitancy  There is a previous history of of similar symptoms. Sexually active:  No   No concern for STI. She is also c/o weakness in hands and legs.  Fatigue. And mult joint  Pains.  Pt has not seen rheum in several year.  Prior to Admission medications   Medication Sig Start Date End Date Taking? Authorizing Provider  diazepam (VALIUM) 5 MG tablet Take 1 tablet (5 mg total) by mouth every 6 (six) hours as needed for anxiety (spasms). 08/31/14  Yes Wandra Arthurs, MD  donepezil (ARICEPT) 5 MG tablet TAKE ONE TABLET BY MOUTH AT BEDTIME AS NEEDED 09/06/13  Yes Yvonne R Lowne, DO  Fe Fum-Vit C-Vit B12-FA (TRIGELS-F) 460-60-0.01-1 MG CAPS Take 1 capsule by mouth daily. 07/11/11  Yes Alferd Apa Lowne, DO  ferrous sulfate dried (SLOW FE) 160 (50 FE) MG TBCR SR tablet Take 1 tablet (160 mg total) by mouth daily. 01/11/14  Yes Yvonne R Lowne, DO  gabapentin (NEURONTIN) 600 MG tablet Take 1 tablet (600 mg total) by mouth 3 (three) times daily. 01/11/14 04/11/16 Yes Rosalita Chessman, DO  levothyroxine (SYNTHROID, LEVOTHROID) 75 MCG tablet Take 1 tablet (75 mcg total) by mouth daily before breakfast. 10/17/14  Yes Yvonne R Lowne, DO  LORazepam (ATIVAN) 0.5 MG tablet TAKE ONE TABLET BY MOUTH THREE TIMES DAILY AS NEEDED 10/09/13  Yes Alferd Apa Lowne, DO  metoprolol succinate (TOPROL-XL) 50 MG 24 hr tablet TAKE ONE TABLET BY MOUTH TWICE DAILY 09/16/14  Yes Rosalita Chessman, DO  Misc Natural Products (7-KETO LEAN) CAPS Take 1 capsule by mouth daily.   Yes Historical Provider,  MD  oxyCODONE-acetaminophen (PERCOCET) 5-325 MG per tablet Take 1-2 tablets by mouth every 4 (four) hours as needed. 08/31/14  Yes Wandra Arthurs, MD  PARoxetine (PAXIL) 30 MG tablet Take 1 tablet (30 mg total) by mouth daily. 10/12/13  Yes Yvonne R Lowne, DO  ranitidine (ZANTAC) 150 MG capsule TAKE ONE CAPSULE BY MOUTH TWICE DAILY 04/27/13  Yes Yvonne R Lowne, DO  traMADol (ULTRAM) 50 MG tablet TAKE ONE TO TWO TABLETS BY MOUTH EVERY 6 HOURS AS NEEDED 01/11/14  Yes Rosalita Chessman, DO    Review of Systems:Review of Systems - General ROS: negative Cardiovascular ROS: no chest pain or dyspnea on exertion Gastrointestinal ROS: no abdominal pain, change in bowel habits, or black or bloody stools MS--+ mult joint pains and swelling, weakness PE:  BP 132/82 mmHg  Pulse 90  Temp(Src) 98.5 F (36.9 C) (Oral)  Wt 144 lb (65.318 kg)  SpO2 96% Constitutional- no fever, chills , nv  Heart--+ S1S2 Lungs-  Ctab/l   Back--+ flank pain Abdomen- low abd pain, soft   Results for orders placed or performed in visit on 11/07/14  POCT Urinalysis Dipstick  Result Value Ref Range   Color, UA Dark Yellow    Clarity, UA Clear    Glucose, UA Neg    Bilirubin, UA Neg    Ketones, UA Neg    Spec Grav, UA >=1.030  Blood, UA Neg    pH, UA 6.0    Protein, UA Neg    Urobilinogen, UA 1.0    Nitrite, UA Neg    Leukocytes, UA Negative     Assessment and Plan:  1. Urinary tract infection without hematuria, site unspecified Check culture cipro - POCT Urinalysis Dipstick - Urine Culture  1. Urinary tract infection without hematuria, site unspecified  - POCT Urinalysis Dipstick - Urine Culture - Basic metabolic panel - CBC with Differential - Hepatic function panel - Lipid panel - Hemoglobin A1c - Microalbumin / creatinine urine ratio - TSH - Vitamin D 1,25 dihydroxy - Vitamin B12 - Rheumatoid factor - ANA - Sedimentation rate   2. Joint pain Check labs--consider rheum f/u - Basic metabolic  panel - CBC with Differential - Hepatic function panel - Lipid panel - Hemoglobin A1c - Microalbumin / creatinine urine ratio - TSH - Vitamin D 1,25 dihydroxy - Vitamin B12 - Rheumatoid factor - ANA - Sedimentation rate  3. Preventative health care Schedule cpe - Basic metabolic panel - CBC with Differential - Hepatic function panel - Lipid panel - Hemoglobin A1c - Microalbumin / creatinine urine ratio - TSH - Vitamin D 1,25 dihydroxy - Vitamin B12 - Rheumatoid factor - ANA - Sedimentation rate  4.  Back pain--weakness---- pt rescheduled neurosurgery to later this week

## 2014-11-07 NOTE — Progress Notes (Signed)
Pre visit review using our clinic review tool, if applicable. No additional management support is needed unless otherwise documented below in the visit note. 

## 2014-11-08 ENCOUNTER — Other Ambulatory Visit: Payer: Self-pay | Admitting: Family Medicine

## 2014-11-08 ENCOUNTER — Telehealth: Payer: Self-pay | Admitting: Family Medicine

## 2014-11-08 DIAGNOSIS — R809 Proteinuria, unspecified: Secondary | ICD-10-CM

## 2014-11-08 LAB — CBC WITH DIFFERENTIAL/PLATELET
BASOS ABS: 0 10*3/uL (ref 0.0–0.1)
BASOS PCT: 0.7 % (ref 0.0–3.0)
EOS ABS: 0.1 10*3/uL (ref 0.0–0.7)
Eosinophils Relative: 1 % (ref 0.0–5.0)
HCT: 35.9 % — ABNORMAL LOW (ref 36.0–46.0)
Hemoglobin: 11.7 g/dL — ABNORMAL LOW (ref 12.0–15.0)
Lymphocytes Relative: 32.9 % (ref 12.0–46.0)
Lymphs Abs: 1.6 10*3/uL (ref 0.7–4.0)
MCHC: 32.7 g/dL (ref 30.0–36.0)
MCV: 83.4 fl (ref 78.0–100.0)
MONO ABS: 0.3 10*3/uL (ref 0.1–1.0)
Monocytes Relative: 5.6 % (ref 3.0–12.0)
NEUTROS PCT: 59.8 % (ref 43.0–77.0)
Neutro Abs: 3 10*3/uL (ref 1.4–7.7)
Platelets: 249 10*3/uL (ref 150.0–400.0)
RBC: 4.3 Mil/uL (ref 3.87–5.11)
RDW: 13.9 % (ref 11.5–15.5)
WBC: 4.9 10*3/uL (ref 4.0–10.5)

## 2014-11-08 LAB — TSH: TSH: 0.23 u[IU]/mL — ABNORMAL LOW (ref 0.35–4.50)

## 2014-11-08 LAB — BASIC METABOLIC PANEL
BUN: 10 mg/dL (ref 6–23)
CHLORIDE: 105 meq/L (ref 96–112)
CO2: 27 mEq/L (ref 19–32)
CREATININE: 0.6 mg/dL (ref 0.4–1.2)
Calcium: 9.2 mg/dL (ref 8.4–10.5)
GFR: 126.58 mL/min (ref >60.00)
Glucose, Bld: 86 mg/dL (ref 70–99)
POTASSIUM: 3.9 meq/L (ref 3.5–5.1)
Sodium: 137 mEq/L (ref 135–145)

## 2014-11-08 LAB — ANA: Anti Nuclear Antibody(ANA): POSITIVE — AB

## 2014-11-08 LAB — VITAMIN B12: Vitamin B-12: 500 pg/mL (ref 211–911)

## 2014-11-08 LAB — MICROALBUMIN / CREATININE URINE RATIO
Creatinine,U: 169.1 mg/dL
Microalb Creat Ratio: 1.8 mg/g (ref 0.0–30.0)
Microalb, Ur: 3.1 mg/dL — ABNORMAL HIGH (ref 0.0–1.9)

## 2014-11-08 LAB — HEPATIC FUNCTION PANEL
ALT: 17 U/L (ref 0–35)
AST: 26 U/L (ref 0–37)
Albumin: 4.4 g/dL (ref 3.5–5.2)
Alkaline Phosphatase: 79 U/L (ref 39–117)
BILIRUBIN TOTAL: 0.8 mg/dL (ref 0.2–1.2)
Bilirubin, Direct: 0.1 mg/dL (ref 0.0–0.3)
Total Protein: 8 g/dL (ref 6.0–8.3)

## 2014-11-08 LAB — ANTI-NUCLEAR AB-TITER (ANA TITER)

## 2014-11-08 LAB — URINE CULTURE
Colony Count: NO GROWTH
Organism ID, Bacteria: NO GROWTH

## 2014-11-08 LAB — SEDIMENTATION RATE: Sed Rate: 20 mm/hr (ref 0–22)

## 2014-11-08 LAB — LIPID PANEL
Cholesterol: 196 mg/dL (ref 0–200)
HDL: 61.3 mg/dL (ref >39.00)
LDL Cholesterol: 127 mg/dL — ABNORMAL HIGH (ref 0–99)
NonHDL: 134.7
Total CHOL/HDL Ratio: 3
Triglycerides: 41 mg/dL (ref 0.0–149.0)
VLDL: 8.2 mg/dL (ref 0.0–40.0)

## 2014-11-08 LAB — HEMOGLOBIN A1C: HEMOGLOBIN A1C: 6.4 % (ref 4.6–6.5)

## 2014-11-08 NOTE — Telephone Encounter (Signed)
emmi emailed °

## 2014-11-09 ENCOUNTER — Telehealth: Payer: Self-pay

## 2014-11-09 DIAGNOSIS — R768 Other specified abnormal immunological findings in serum: Secondary | ICD-10-CM

## 2014-11-09 MED ORDER — LEVOTHYROXINE SODIUM 50 MCG PO TABS: 50.0000 ug | ORAL_TABLET | Freq: Every day | ORAL | Status: DC

## 2014-11-09 NOTE — Telephone Encounter (Signed)
Hyperthyroid--- dec synthroid to 50 mcg #30 2 refills Recheck TSh in 2 months  Ana abnormal-- needs rheum referral + microalbumin--- need 24 hour Cholesterol--- LDL goal < 100, HDL >40, TG < 150. Diet and exercise will increase HDL and decrease LDL and TG. Fish, Fish Oil, Flaxseed oil will also help increase the HDL and decrease Triglycerides.  Recheck labs in 3 months. Vita d pending Hyperlipidemia---lipid, hep   Spoke with patient and made her aware of the abnormal TSH, she has verbalized understanding and agreed to decrease her Synthroid to 50 mcg and the Rx has been faxed. The patient agreed to seeing Rheumatology and will come by the office to pick up her 24 hour urine container. Ref has been placed.      KP

## 2014-11-09 NOTE — Telephone Encounter (Signed)
-----   Message from Rosalita Chessman, DO sent at 11/08/2014  9:23 PM EST ----- Hyperthyroid--- dec synthroid to 50 mcg #30 2 refills Recheck TSh in 2 months

## 2014-11-10 ENCOUNTER — Other Ambulatory Visit: Payer: Self-pay | Admitting: Family Medicine

## 2014-11-10 ENCOUNTER — Telehealth: Payer: Self-pay | Admitting: Family Medicine

## 2014-11-10 DIAGNOSIS — G4700 Insomnia, unspecified: Secondary | ICD-10-CM

## 2014-11-10 DIAGNOSIS — G4733 Obstructive sleep apnea (adult) (pediatric): Secondary | ICD-10-CM

## 2014-11-10 LAB — VITAMIN D 1,25 DIHYDROXY
Vitamin D 1, 25 (OH)2 Total: 46 pg/mL (ref 18–72)
Vitamin D2 1, 25 (OH)2: <8 pg/mL
Vitamin D3 1, 25 (OH)2: 46 pg/mL

## 2014-11-10 NOTE — Telephone Encounter (Signed)
Discussed with patient the exact information that we discussed yesterday, she wanted to get a referral because the therapist thinks she has sleep apnea due to her insomnia.  Please advise.     KP

## 2014-11-10 NOTE — Telephone Encounter (Signed)
Caller name: Semiyah Relation to pt: self Call back number: [phone] Pharmacy:  Reason for call:   Patient has additional questions regarding labs and directions that she is supposed to do

## 2014-11-12 ENCOUNTER — Encounter: Payer: Self-pay | Admitting: Family Medicine

## 2014-11-24 ENCOUNTER — Other Ambulatory Visit: Payer: Self-pay | Admitting: Neurosurgery

## 2014-11-24 DIAGNOSIS — M5412 Radiculopathy, cervical region: Secondary | ICD-10-CM

## 2014-12-05 ENCOUNTER — Telehealth: Payer: Self-pay | Admitting: Family Medicine

## 2014-12-05 NOTE — Telephone Encounter (Signed)
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  [place]

## 2014-12-05 NOTE — Telephone Encounter (Signed)
What drs appointments-?  She has not been here a lot?  If she is seeing another specialist a lot they should write the note.

## 2014-12-05 NOTE — Telephone Encounter (Signed)
Please advise      KP 

## 2014-12-05 NOTE — Telephone Encounter (Signed)
Tried contacting the patient/VM full.    KP

## 2014-12-06 NOTE — Telephone Encounter (Signed)
Caller name: Darianny, Momon  Relation to pt: self  Call back number: [phone] Pharmacy:  Reason for call:  Pt wanted to inform you the following doctors she has seen  11/14/14 DR. bOTERO 11/29/14 Dr. Lou Miner  12/06/14 Dr. Charlestine Night  12/06/14 [facility]

## 2014-12-06 NOTE — Telephone Encounter (Signed)
She said she needs something stating she is having medical issues. She said she is having multiple apt's and could not focus on class. She is concerned about her well being, she said she seen one at the end of Jan, seen Dr.Botero, said she has seen someone else whose name she can not remember. Said she seen [facility] today, No diagnosis at this time just doing test and imaging.  Please advise.      KP

## 2014-12-07 NOTE — Telephone Encounter (Signed)
We need office notes from all docs if we don't have them

## 2014-12-08 NOTE — Telephone Encounter (Signed)
Patient has been made aware and verbalized understanding.     KP

## 2014-12-08 NOTE — Telephone Encounter (Signed)
Caller name:Tatem Prabhakar Relationship to patient:self Can be reached: [phone]   Reason for call: PT states returning call regarding doctors note for school- wanting to speak with Maudie Mercury

## 2014-12-13 ENCOUNTER — Ambulatory Visit
Admission: RE | Admit: 2014-12-13 | Discharge: 2014-12-13 | Disposition: A | Discharge status: Home / Self Care | Payer: 59 | Source: Ambulatory Visit | Attending: Neurosurgery | Admitting: Neurosurgery

## 2014-12-13 DIAGNOSIS — M5412 Radiculopathy, cervical region: Secondary | ICD-10-CM

## 2014-12-13 MED ORDER — DIAZEPAM 5 MG PO TABS: 10.0000 mg | ORAL_TABLET | Freq: Once | ORAL | Status: DC

## 2014-12-13 MED ORDER — ONDANSETRON HCL 4 MG/2ML IJ SOLN: 4.0000 mg | Freq: Four times a day (QID) | INTRAMUSCULAR | Status: DC | PRN

## 2014-12-13 NOTE — Discharge Instructions (Signed)
Myelogram Discharge Instructions  1. Go home and rest quietly for the next 24 hours.  It is important to lie flat for the next 24 hours.  Get up only to go to the restroom.  You may lie in the bed or on a couch on your back, your stomach, your left side or your right side.  You may have one pillow under your head.  You may have pillows between your knees while you are on your side or under your knees while you are on your back.  2. DO NOT drive today.  Recline the seat as far back as it will go, while still wearing your seat belt, on the way home.  3. You may get up to go to the bathroom as needed.  You may sit up for 10 minutes to eat.  You may resume your normal diet and medications unless otherwise indicated.  Drink lots of extra fluids today and tomorrow.  4. The incidence of headache, nausea, or vomiting is about 5% (one in 20 patients).  If you develop a headache, lie flat and drink plenty of fluids until the headache goes away.  Caffeinated beverages may be helpful.  If you develop severe nausea and vomiting or a headache that does not go away with flat bed rest, call [phone].  5. You may resume normal activities after your 24 hours of bed rest is over; however, do not exert yourself strongly or do any heavy lifting tomorrow. If when you get up you have a headache when standing, go back to bed and force fluids for another 24 hours.  6. Call your physician for a follow-up appointment.  The results of your myelogram will be sent directly to your physician by the following day.  7. If you have any questions or if complications develop after you arrive home, please call [phone].  Discharge instructions have been explained to the patient.  The patient, or the person responsible for the patient, fully understands these instructions.      May resume Paroxetine / Paxil and Tramadol on Feb. 17, 2016, after 11:00 am.

## 2014-12-15 ENCOUNTER — Telehealth: Payer: Self-pay | Admitting: Family Medicine

## 2014-12-15 NOTE — Telephone Encounter (Signed)
Spoke with patient and she stated she has a lot going on and she really needs a note to get out of school at this time. The patient has seen multiple specialist that we have referred her too and has had some death in her family.  She just wants to be out the rest of the semester.   Please advise     KP

## 2014-12-15 NOTE — Telephone Encounter (Signed)
Give note

## 2014-12-15 NOTE — Telephone Encounter (Signed)
Caller name: Jaylissa Relation to pt: Call back number: [phone] Pharmacy:  Reason for call: Pt request to speak with Maudie Mercury about requesting Medical Records. Please advise

## 2014-12-16 NOTE — Telephone Encounter (Signed)
Patient aware note ready for pick up.      KP

## 2014-12-20 ENCOUNTER — Ambulatory Visit
Admission: RE | Admit: 2014-12-20 | Discharge: 2014-12-20 | Disposition: A | Discharge status: Home / Self Care | Payer: 59 | Source: Ambulatory Visit | Attending: Neurosurgery | Admitting: Neurosurgery

## 2014-12-20 DIAGNOSIS — R29898 Other symptoms and signs involving the musculoskeletal system: Secondary | ICD-10-CM

## 2014-12-20 MED ORDER — DIAZEPAM 5 MG PO TABS
10.0000 mg | ORAL_TABLET | Freq: Once | ORAL | Status: AC
  Administered 2014-12-20: 10 mg via ORAL

## 2014-12-20 MED ORDER — MEPERIDINE HCL 100 MG/ML IJ SOLN
75.0000 mg | Freq: Once | INTRAMUSCULAR | Status: AC
  Administered 2014-12-20: 75 mg via INTRAMUSCULAR

## 2014-12-20 MED ORDER — ONDANSETRON HCL 4 MG/2ML IJ SOLN
4.0000 mg | Freq: Once | INTRAMUSCULAR | Status: AC
  Administered 2014-12-20: 4 mg via INTRAMUSCULAR

## 2014-12-20 MED ORDER — IOHEXOL 300 MG/ML  SOLN
10.0000 mL | Freq: Once | INTRAMUSCULAR | Status: AC | PRN
  Administered 2014-12-20: 10 mL via INTRATHECAL

## 2014-12-20 NOTE — Discharge Instructions (Signed)
Myelogram Discharge Instructions  1. Go home and rest quietly for the next 24 hours.  It is important to lie flat for the next 24 hours.  Get up only to go to the restroom.  You may lie in the bed or on a couch on your back, your stomach, your left side or your right side.  You may have one pillow under your head.  You may have pillows between your knees while you are on your side or under your knees while you are on your back.  2. DO NOT drive today.  Recline the seat as far back as it will go, while still wearing your seat belt, on the way home.  3. You may get up to go to the bathroom as needed.  You may sit up for 10 minutes to eat.  You may resume your normal diet and medications unless otherwise indicated.  Drink plenty of extra fluids today and tomorrow.  4. The incidence of a spinal headache with nausea and/or vomiting is about 5% (one in 20 patients).  If you develop a headache, lie flat and drink plenty of fluids until the headache goes away.  Caffeinated beverages may be helpful.  If you develop severe nausea and vomiting or a headache that does not go away with flat bed rest, call [phone].  5. You may resume normal activities after your 24 hours of bed rest is over; however, do not exert yourself strongly or do any heavy lifting tomorrow.  6. Call your physician for a follow-up appointment.   You may resume Paroxetine and Tramadol on Wednesday, December 21, 2014 after 11:00a.m.

## 2014-12-20 NOTE — Progress Notes (Signed)
Patient states she has been off Tramadol and Paxil for at least the past two days.  jkl

## 2014-12-26 ENCOUNTER — Institutional Professional Consult (permissible substitution): Payer: 59 | Admitting: Pulmonary Disease

## 2014-12-27 ENCOUNTER — Telehealth: Payer: Self-pay | Admitting: Family Medicine

## 2014-12-27 DIAGNOSIS — M542 Cervicalgia: Secondary | ICD-10-CM

## 2014-12-27 DIAGNOSIS — R29898 Other symptoms and signs involving the musculoskeletal system: Secondary | ICD-10-CM

## 2014-12-27 NOTE — Telephone Encounter (Signed)
Referral has been placed.     KP 

## 2014-12-27 NOTE — Telephone Encounter (Signed)
Caller name: Shayana, Hornstein Relation to pt: self  Call back number: [phone]   Reason for call:  Pt requesting a referral for [facility]. Dr. Angelena Form office advised pt every time she is seen she needs a referral. Please advise   rnesto Andres Shad, MD

## 2015-01-16 ENCOUNTER — Other Ambulatory Visit: Payer: Self-pay | Admitting: Family Medicine

## 2015-01-16 DIAGNOSIS — E0590 Thyrotoxicosis, unspecified without thyrotoxic crisis or storm: Secondary | ICD-10-CM

## 2015-01-17 ENCOUNTER — Other Ambulatory Visit: Payer: Self-pay | Admitting: Neurosurgery

## 2015-01-17 DIAGNOSIS — M5412 Radiculopathy, cervical region: Secondary | ICD-10-CM

## 2015-01-17 NOTE — Telephone Encounter (Signed)
Spoke with patient and advised her that she is due for labs, she stated she had a CPE in April and waned to have thew labs done at that time. Per Dr.Lowne this was ok. Rx faxed. She also had some billing issues with Dr.Truslow's office and I deferred her back to them and her insurance company. New handicapped placard request, form completed and the patient will pick up today.     KP

## 2015-01-24 ENCOUNTER — Telehealth: Payer: Self-pay | Admitting: Family Medicine

## 2015-01-24 NOTE — Telephone Encounter (Signed)
Pre visit letter sent  °

## 2015-01-25 ENCOUNTER — Ambulatory Visit
Admission: RE | Admit: 2015-01-25 | Discharge: 2015-01-25 | Disposition: A | Discharge status: Home / Self Care | Payer: 59 | Source: Ambulatory Visit | Attending: Neurosurgery | Admitting: Neurosurgery

## 2015-01-25 DIAGNOSIS — M5412 Radiculopathy, cervical region: Secondary | ICD-10-CM

## 2015-01-25 MED ORDER — TRIAMCINOLONE ACETONIDE 40 MG/ML IJ SUSP (RADIOLOGY)
60.0000 mg | Freq: Once | INTRAMUSCULAR | Status: AC
  Administered 2015-01-25: 60 mg via EPIDURAL

## 2015-01-25 MED ORDER — IOHEXOL 300 MG/ML  SOLN
1.0000 mL | Freq: Once | INTRAMUSCULAR | Status: AC | PRN
  Administered 2015-01-25: 1 mL via EPIDURAL

## 2015-01-25 NOTE — Discharge Instructions (Signed)

## 2015-02-01 ENCOUNTER — Telehealth: Payer: Self-pay | Admitting: Family Medicine

## 2015-02-01 NOTE — Telephone Encounter (Signed)
Caller name: Dessire Relation to pt: self Call back number: [phone] Pharmacy:  Reason for call:   Needs to talk to [facility] regarding her visit with Dr. Charlestine Night. Patient would not go into detail

## 2015-02-02 NOTE — Telephone Encounter (Signed)
To MD for review     KP 

## 2015-02-02 NOTE — Telephone Encounter (Signed)
Call from the patient and she stated Dr.Truslow's office stated we never referred the patient to them however the referral information was faxed to them in January. The patient said she received a bill and they are making her pay it because we did not refer her.  I spoke with Crystal and the patient has [facility] compass and the authorization was not approved for the patient to be seen through the insurance company prior to the visit and the patient is responsible for the bill. Please advise if the precert with the insurance company was complete prior to her visit so the information can be forward to the specialist so they can reprocess the payment information.     KP

## 2015-02-02 NOTE — Telephone Encounter (Signed)
Insurance referral was not done, [facility] faxed records over for referral. Our office was never made aware of appt. That is the referring office responsibility to contact us with appointment info, as well as patients.

## 2015-02-02 NOTE — Telephone Encounter (Signed)
Debra [name], Debra [name]----  Not sure what else if there is anything we can do

## 2015-02-13 ENCOUNTER — Telehealth: Payer: Self-pay | Admitting: *Deleted

## 2015-02-13 NOTE — Telephone Encounter (Signed)
Unable to reach patient at time of Pre-Visit Call.  Left message for patient to return call when available.    

## 2015-02-14 ENCOUNTER — Encounter: Payer: 59 | Admitting: Family Medicine

## 2015-02-16 ENCOUNTER — Telehealth: Payer: Self-pay | Admitting: Family Medicine

## 2015-02-16 NOTE — Telephone Encounter (Signed)
Pt was no show for cpe on 02/14/15- has rescheduled - charge pt?

## 2015-02-16 NOTE — Telephone Encounter (Signed)
No---not this time-- yes next time

## 2015-02-18 ENCOUNTER — Other Ambulatory Visit: Payer: Self-pay | Admitting: Family Medicine

## 2015-02-20 ENCOUNTER — Telehealth: Payer: Self-pay | Admitting: *Deleted

## 2015-02-20 NOTE — Telephone Encounter (Signed)
Unable to reach patient at time of Pre-Visit Call.  Left message for patient to return call when available.    

## 2015-02-21 ENCOUNTER — Other Ambulatory Visit (HOSPITAL_COMMUNITY): Admission: RE | Admit: 2015-02-21 | Payer: 59 | Source: Ambulatory Visit | Admitting: Family Medicine

## 2015-02-21 ENCOUNTER — Encounter: Payer: Self-pay | Admitting: Family Medicine

## 2015-02-21 ENCOUNTER — Ambulatory Visit (INDEPENDENT_AMBULATORY_CARE_PROVIDER_SITE_OTHER): Payer: 59 | Admitting: Pulmonary Disease

## 2015-02-21 ENCOUNTER — Other Ambulatory Visit (HOSPITAL_COMMUNITY)
Admission: RE | Admit: 2015-02-21 | Discharge: 2015-02-21 | Disposition: A | Discharge status: Home / Self Care | Payer: 59 | Source: Ambulatory Visit | Attending: Family Medicine | Admitting: Family Medicine

## 2015-02-21 ENCOUNTER — Ambulatory Visit (INDEPENDENT_AMBULATORY_CARE_PROVIDER_SITE_OTHER): Payer: 59 | Admitting: Family Medicine

## 2015-02-21 ENCOUNTER — Other Ambulatory Visit (INDEPENDENT_AMBULATORY_CARE_PROVIDER_SITE_OTHER): Payer: 59

## 2015-02-21 ENCOUNTER — Encounter: Payer: Self-pay | Admitting: Pulmonary Disease

## 2015-02-21 VITALS — BP 122/94 | HR 68 | Ht 64.5 in | Wt 152.4 lb

## 2015-02-21 VITALS — BP 123/78 | HR 76 | Temp 98.8°F | Ht 64.5 in | Wt 151.4 lb

## 2015-02-21 DIAGNOSIS — F411 Generalized anxiety disorder: Secondary | ICD-10-CM

## 2015-02-21 DIAGNOSIS — Z01419 Encounter for gynecological examination (general) (routine) without abnormal findings: Secondary | ICD-10-CM | POA: Diagnosis not present

## 2015-02-21 DIAGNOSIS — Z1231 Encounter for screening mammogram for malignant neoplasm of breast: Secondary | ICD-10-CM

## 2015-02-21 DIAGNOSIS — G4710 Hypersomnia, unspecified: Secondary | ICD-10-CM | POA: Diagnosis not present

## 2015-02-21 DIAGNOSIS — E2839 Other primary ovarian failure: Secondary | ICD-10-CM

## 2015-02-21 DIAGNOSIS — D649 Anemia, unspecified: Secondary | ICD-10-CM

## 2015-02-21 DIAGNOSIS — I10 Essential (primary) hypertension: Secondary | ICD-10-CM

## 2015-02-21 DIAGNOSIS — Z0000 Encounter for general adult medical examination without abnormal findings: Secondary | ICD-10-CM

## 2015-02-21 DIAGNOSIS — E039 Hypothyroidism, unspecified: Secondary | ICD-10-CM | POA: Diagnosis not present

## 2015-02-21 LAB — BASIC METABOLIC PANEL
BUN: 10 mg/dL (ref 6–23)
CO2: 31 meq/L (ref 19–32)
Calcium: 10 mg/dL (ref 8.4–10.5)
Chloride: 105 mEq/L (ref 96–112)
Creatinine, Ser: 0.6 mg/dL (ref 0.40–1.20)
GFR: 131.33 mL/min (ref >60.00)
Glucose, Bld: 73 mg/dL (ref 70–99)
Potassium: 4 mEq/L (ref 3.5–5.1)
SODIUM: 139 meq/L (ref 135–145)

## 2015-02-21 LAB — IRON AND TIBC
%SAT: 24 % (ref 20–55)
Iron: 69 ug/dL (ref 42–145)
TIBC: 290 ug/dL (ref 250–470)
UIBC: 221 ug/dL (ref 125–400)

## 2015-02-21 LAB — COMPREHENSIVE METABOLIC PANEL
ALBUMIN: 4 g/dL (ref 3.5–5.2)
ALK PHOS: 101 U/L (ref 39–117)
ALT: 16 U/L (ref 0–35)
AST: 22 U/L (ref 0–37)
BILIRUBIN TOTAL: 0.3 mg/dL (ref 0.2–1.2)
BUN: 11 mg/dL (ref 6–23)
CALCIUM: 9.6 mg/dL (ref 8.4–10.5)
CO2: 30 mEq/L (ref 19–32)
Chloride: 106 mEq/L (ref 96–112)
Creatinine, Ser: 0.62 mg/dL (ref 0.40–1.20)
GFR: 126.46 mL/min (ref >60.00)
Glucose, Bld: 70 mg/dL (ref 70–99)
POTASSIUM: 4.1 meq/L (ref 3.5–5.1)
Sodium: 139 mEq/L (ref 135–145)
TOTAL PROTEIN: 7.1 g/dL (ref 6.0–8.3)

## 2015-02-21 LAB — CBC WITH DIFFERENTIAL/PLATELET
BASOS ABS: 0 10*3/uL (ref 0.0–0.1)
BASOS PCT: 0.3 % (ref 0.0–3.0)
Basophils Absolute: 0 10*3/uL (ref 0.0–0.1)
Basophils Relative: 0.4 % (ref 0.0–3.0)
EOS ABS: 0.1 10*3/uL (ref 0.0–0.7)
EOS PCT: 2.1 % (ref 0.0–5.0)
Eosinophils Absolute: 0.1 10*3/uL (ref 0.0–0.7)
Eosinophils Relative: 2 % (ref 0.0–5.0)
HCT: 36 % (ref 36.0–46.0)
HEMATOCRIT: 35.4 % — AB (ref 36.0–46.0)
HEMOGLOBIN: 11.9 g/dL — AB (ref 12.0–15.0)
Hemoglobin: 12.1 g/dL (ref 12.0–15.0)
LYMPHS ABS: 2.2 10*3/uL (ref 0.7–4.0)
LYMPHS PCT: 38.8 % (ref 12.0–46.0)
Lymphocytes Relative: 33.6 % (ref 12.0–46.0)
Lymphs Abs: 2 10*3/uL (ref 0.7–4.0)
MCHC: 33.6 g/dL (ref 30.0–36.0)
MCHC: 33.6 g/dL (ref 30.0–36.0)
MCV: 82.9 fl (ref 78.0–100.0)
MCV: 82.8 fl (ref 78.0–100.0)
MONOS PCT: 5.8 % (ref 3.0–12.0)
Monocytes Absolute: 0.4 10*3/uL (ref 0.1–1.0)
Monocytes Absolute: 0.3 10*3/uL (ref 0.1–1.0)
Monocytes Relative: 7.8 % (ref 3.0–12.0)
NEUTROS ABS: 3.5 10*3/uL (ref 1.4–7.7)
NEUTROS PCT: 50.9 % (ref 43.0–77.0)
Neutro Abs: 2.9 10*3/uL (ref 1.4–7.7)
Neutrophils Relative %: 58.3 % (ref 43.0–77.0)
Platelets: 245 10*3/uL (ref 150.0–400.0)
Platelets: 251 10*3/uL (ref 150.0–400.0)
RBC: 4.35 Mil/uL (ref 3.87–5.11)
RBC: 4.27 Mil/uL (ref 3.87–5.11)
RDW: 14.1 % (ref 11.5–15.5)
RDW: 14.3 % (ref 11.5–15.5)
WBC: 5.7 10*3/uL (ref 4.0–10.5)
WBC: 6 10*3/uL (ref 4.0–10.5)

## 2015-02-21 LAB — HEPATIC FUNCTION PANEL
ALBUMIN: 4.1 g/dL (ref 3.5–5.2)
ALT: 17 U/L (ref 0–35)
AST: 23 U/L (ref 0–37)
Alkaline Phosphatase: 105 U/L (ref 39–117)
BILIRUBIN TOTAL: 0.3 mg/dL (ref 0.2–1.2)
Bilirubin, Direct: 0.1 mg/dL (ref 0.0–0.3)
TOTAL PROTEIN: 7.1 g/dL (ref 6.0–8.3)

## 2015-02-21 LAB — FERRITIN: FERRITIN: 78.8 ng/mL (ref 10.0–291.0)

## 2015-02-21 LAB — LIPID PANEL
CHOLESTEROL: 180 mg/dL (ref 0–200)
HDL: 59.8 mg/dL (ref >39.00)
LDL CALC: 98 mg/dL (ref 0–99)
NonHDL: 120.2
TRIGLYCERIDES: 110 mg/dL (ref 0.0–149.0)
Total CHOL/HDL Ratio: 3
VLDL: 22 mg/dL (ref 0.0–40.0)

## 2015-02-21 LAB — TSH: TSH: 0.93 u[IU]/mL (ref 0.35–4.50)

## 2015-02-21 MED ORDER — FERROUS SULFATE DRIED ER 160 (50 FE) MG PO TBCR: 1.0000 | EXTENDED_RELEASE_TABLET | Freq: Every day | ORAL | Status: DC

## 2015-02-21 MED ORDER — PAROXETINE HCL 30 MG PO TABS: 30.0000 mg | ORAL_TABLET | Freq: Every day | ORAL | Status: DC

## 2015-02-21 MED ORDER — METOPROLOL SUCCINATE ER 50 MG PO TB24: ORAL_TABLET | ORAL | Status: DC

## 2015-02-21 NOTE — Progress Notes (Signed)
Chief Complaint  Patient presents with  . SLEEP CONSULT    Referred by Dr Etter Sjogren. Epworth Score:     History of Present Illness: Debra [name] is a 59 y.o. female for evaluation of sleep problems.  She has been followed by behavioral therapist for insomnia, depression, and anxiety.  There was concern that she could have organic medical problem contributing to her sleep difficulties.  She has trouble falling asleep, staying asleep, and feeling sleepy during the day.  She sleeps alone, and is not aware if she snores or stops breathing while asleep.  She has trouble sleeping on her back, and her mouth gets very dry at night.  She will also get neck and arm pains at night, and this can cause her trouble with her sleep at times.  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  It takes her several hours to wind down frm work, and she sometimes feels anxious about her children.  She goes to sleep between 3 and 4 am.  She falls asleep quickly sometimes, but often can take up to an hour.  She wakes up a few times to use the bathroom.  She gets out of bed between 11 am and 1 pm.  She feels tired in the morning.  She denies morning headache.  She will sometimes drink alcohol sometimes to help her sleep, especially when she feels depressed.  She will drink 4 to 5 cups of coffee during the day.  She has been prescribed paxil, but does not take this every day.  She will forget to take her medications, and does have trouble with her memory and concentration.  She gets funny feelings in her legs.  This happens at night.  She has to wiggle her legs to get comfortable.  She has noticed that using gabapentin helps with these symptoms.  She has been prescribed iron pills, but does not take these.  She denies sleep walking, sleep talking, bruxism, or nightmares.  She denies sleep hallucinations, sleep paralysis, or cataplexy.  The Epworth score is 5 out of 24.  Tests:   Debra [name]   has a past medical history of GERD (gastroesophageal reflux disease); Anxiety; Thyroid disease; Mixed connective tissue disease; and DDD (degenerative disc disease).  Debra [name]  has past surgical history that includes Carpal tunnel release; Leg Surgery; and Cervical spine surgery.  Prior to Admission medications   Medication Sig Start Date End Date Taking? Authorizing Provider  levothyroxine (SYNTHROID, LEVOTHROID) 50 MCG tablet Take 1 tablet (50 mcg total) by mouth daily before breakfast. Repeat labs are due now 02/20/15  Yes Alferd Apa Lowne, DO  metoprolol succinate (TOPROL-XL) 50 MG 24 hr tablet TAKE ONE TABLET BY MOUTH TWICE DAILY 09/16/14  Yes Yvonne R Lowne, DO  PARoxetine (PAXIL) 30 MG tablet Take 1 tablet (30 mg total) by mouth daily. 10/12/13  Yes Yvonne R Lowne, DO  ranitidine (ZANTAC) 150 MG capsule TAKE ONE CAPSULE BY MOUTH TWICE DAILY 04/27/13  Yes Yvonne R Lowne, DO  diazepam (VALIUM) 5 MG tablet Take 1 tablet (5 mg total) by mouth every 6 (six) hours as needed for anxiety (spasms). Patient not taking: Reported on 02/21/2015 08/31/14   Wandra Arthurs, MD  Fe Fum-Vit C-Vit B12-FA (TRIGELS-F) 460-60-0.01-1 MG CAPS Take 1 capsule by mouth daily. Patient not taking: Reported on 02/21/2015 07/11/11   Rosalita Chessman, DO  ferrous sulfate dried (SLOW FE) 160 (50 FE)  MG TBCR SR tablet Take 1 tablet (160 mg total) by mouth daily. Patient not taking: Reported on 02/21/2015 01/11/14   Rosalita Chessman, DO  gabapentin (NEURONTIN) 600 MG tablet Take 1 tablet (600 mg total) by mouth 3 (three) times daily. Patient not taking: Reported on 02/21/2015 01/11/14 04/11/16  Rosalita Chessman, DO    Allergies  Allergen Reactions  . Codeine Rash and Other (See Comments)    dizziness    Her family history includes Arthritis in her mother; Breast cancer in her mother; Cancer in her mother; Colon cancer in her maternal grandfather; Diabetes in her sister; Heart disease in her father; Heart disease (age of onset: 68)  in her mother; Hyperlipidemia in her sister; Hypertension in her father and sister; Stroke in her father.  She  reports that she has been smoking Cigarettes.  She has a 20 pack-year smoking history. She does not have any smokeless tobacco history on file. She reports that she drinks alcohol. She reports that she does not use illicit drugs.  Review of Systems  Constitutional: Negative for fever and unexpected weight change.  HENT: Positive for congestion. Negative for dental problem, ear pain, nosebleeds, postnasal drip, rhinorrhea, sinus pressure, sneezing, sore throat and trouble swallowing.        Allergies  Eyes: Positive for itching. Negative for redness.  Respiratory: Positive for shortness of breath ( pt very active with job). Negative for cough, chest tightness and wheezing.   Cardiovascular: Positive for palpitations and leg swelling.  Gastrointestinal: Positive for nausea. Negative for vomiting.  Genitourinary: Negative for dysuria.  Musculoskeletal: Negative for joint swelling.  Skin: Negative for rash.  Neurological: Negative for headaches.  Hematological: Does not bruise/bleed easily.  Psychiatric/Behavioral: Negative for dysphoric mood.   Physical Exam: Blood pressure 122/94, pulse 68, height 5' 4.5" (1.638 m), weight 152 lb 6.4 oz (69.128 kg), SpO2 98 %. Body mass index is 25.76 kg/(m^2).  General - No distress ENT - No sinus tenderness, no oral exudate, no LAN, no thyromegaly, TM clear, pupils equal/reactive, MP 4, scalloped tongue, over bite Cardiac - s1s2 regular, no murmur, pulses symmetric Chest - No wheeze/rales/dullness, good air entry, normal respiratory excursion Back - No focal tenderness Abd - Soft, non-tender, no organomegaly, + bowel sounds Ext - No edema Neuro - Normal strength, cranial nerves intact Skin - No rashes Psych - poor concentration, difficulty with memory recall, flat affect, seems distracted  Discussion: She has trouble with falling asleep,  and staying asleep.  She feels sleepy during the day.  She has trouble with her anxiety, and depression.  She has been inconsistent with her medications, and sometimes has been self medicating with alcohol.  She has been followed by behavioral therapist for depression/anxiety and insomnia.  There is concern she could also have organic medical condition contributing to her sleep difficulties.  She does have some symptoms suggestive of sleep apnea, and has upper airway physical findings that are suggestive of risk for sleep apnea.  She has symptoms suggestive of restless leg syndrome.  Assessment/plan:  Hypersomnia with concern for sleep apnea. Plan: - will arrange for in lab sleep study to further assess  Symptoms suggestive of restless leg syndrome. 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 Plan: - advised her to continue follow up with behavioral therapist >> explained that medications are part of therapy and she needs to consistently take her medications - explained  that cognitive behavioral interventions for insomnia are a slow process and she needs to be patient/persistent with these efforts - advised her to not use alcohol as "therapy" when she is feeling down/depressed - discussed how excessive caffeine intake can contribute to difficulty falling asleep   Chesley Mires, M.D. Pager [phone]

## 2015-02-21 NOTE — Progress Notes (Signed)
Pre visit review using our clinic review tool, if applicable. No additional management support is needed unless otherwise documented below in the visit note. 

## 2015-02-21 NOTE — Patient Instructions (Signed)
Lab tests today Will arrange for sleep study Will call to arrange for follow up after sleep study reviewed

## 2015-02-21 NOTE — Patient Instructions (Addendum)
Your Mammogram and Bone Density Scans are scheduled for Mar 07, 2015 at 10:40 am Address: [place], [place],  [address]  [phone] Preventive Care for Adults A healthy lifestyle and preventive care can promote health and wellness. Preventive health guidelines for women include the following key practices.  A routine yearly physical is a good way to check with your health care provider about your health and preventive screening. It is a chance to share any concerns and updates on your health and to receive a thorough exam.  Visit your dentist for a routine exam and preventive care every 6 months. Brush your teeth twice a day and floss once a day. Good oral hygiene prevents tooth decay and gum disease.  The frequency of eye exams is based on your age, health, family medical history, use of contact lenses, and other factors. Follow your health care provider's recommendations for frequency of eye exams.  Eat a healthy diet. Foods like vegetables, fruits, whole grains, low-fat dairy products, and lean protein foods contain the nutrients you need without too many calories. Decrease your intake of foods high in solid fats, added sugars, and salt. Eat the right amount of calories for you.Get information about a proper diet from your health care provider, if necessary.  Regular physical exercise is one of the most important things you can do for your health. Most adults should get at least 150 minutes of moderate-intensity exercise (any activity that increases your heart rate and causes you to sweat) each week. In addition, most adults need muscle-strengthening exercises on 2 or more days a week.  Maintain a healthy weight. The body mass index (BMI) is a screening tool to identify possible weight problems. It provides an estimate of body fat based on height and weight. Your health care provider can find your BMI and can help you achieve or maintain a healthy weight.For adults 20  years and older:  A BMI below 18.5 is considered underweight.  A BMI of 18.5 to 24.9 is normal.  A BMI of 25 to 29.9 is considered overweight.  A BMI of 30 and above is considered obese.  Maintain normal blood lipids and cholesterol levels by exercising and minimizing your intake of saturated fat. Eat a balanced diet with plenty of fruit and vegetables. Blood tests for lipids and cholesterol should begin at age 50 and be repeated every 5 years. If your lipid or cholesterol levels are high, you are over 50, or you are at high risk for heart disease, you may need your cholesterol levels checked more frequently.Ongoing high lipid and cholesterol levels should be treated with medicines if diet and exercise are not working.  If you smoke, find out from your health care provider how to quit. If you do not use tobacco, do not start.  Lung cancer screening is recommended for adults aged 23-80 years who are at high risk for developing lung cancer because of a history of smoking. A yearly low-dose CT scan of the lungs is recommended for people who have at least a 30-pack-year history of smoking and are a current smoker or have quit within the past 15 years. A pack year of smoking is smoking an average of 1 pack of cigarettes a day for 1 year (for example: 1 pack a day for 30 years or 2 packs a day for 15 years). Yearly screening should continue until the smoker has stopped smoking for at least 15 years. Yearly screening should be stopped for people  who develop a health problem that would prevent them from having lung cancer treatment.  If you are pregnant, do not drink alcohol. If you are breastfeeding, be very cautious about drinking alcohol. If you are not pregnant and choose to drink alcohol, do not have more than 1 drink per day. One drink is considered to be 12 ounces (355 mL) of beer, 5 ounces (148 mL) of wine, or 1.5 ounces (44 mL) of liquor.  Avoid use of street drugs. Do not share needles with  anyone. Ask for help if you need support or instructions about stopping the use of drugs.  High blood pressure causes heart disease and increases the risk of stroke. Your blood pressure should be checked at least every 1 to 2 years. Ongoing high blood pressure should be treated with medicines if weight loss and exercise do not work.  If you are 41-65 years old, ask your health care provider if you should take aspirin to prevent strokes.  Diabetes screening involves taking a blood sample to check your fasting blood sugar level. This should be done once every 3 years, after age 24, if you are within normal weight and without risk factors for diabetes. Testing should be considered at a younger age or be carried out more frequently if you are overweight and have at least 1 risk factor for diabetes.  Breast cancer screening is essential preventive care for women. You should practice "breast self-awareness." This means understanding the normal appearance and feel of your breasts and may include breast self-examination. Any changes detected, no matter how small, should be reported to a health care provider. Women in their 42s and 30s should have a clinical breast exam (CBE) by a health care provider as part of a regular health exam every 1 to 3 years. After age 22, women should have a CBE every year. Starting at age 53, women should consider having a mammogram (breast X-ray test) every year. Women who have a family history of breast cancer should talk to their health care provider about genetic screening. Women at a high risk of breast cancer should talk to their health care providers about having an MRI and a mammogram every year.  Breast cancer gene (BRCA)-related cancer risk assessment is recommended for women who have family members with BRCA-related cancers. BRCA-related cancers include breast, ovarian, tubal, and peritoneal cancers. Having family members with these cancers may be associated with an  increased risk for harmful changes (mutations) in the breast cancer genes BRCA1 and BRCA2. Results of the assessment will determine the need for genetic counseling and BRCA1 and BRCA2 testing.  Routine pelvic exams to screen for cancer are no longer recommended for nonpregnant women who are considered low risk for cancer of the pelvic organs (ovaries, uterus, and vagina) and who do not have symptoms. Ask your health care provider if a screening pelvic exam is right for you.  If you have had past treatment for cervical cancer or a condition that could lead to cancer, you need Pap tests and screening for cancer for at least 20 years after your treatment. If Pap tests have been discontinued, your risk factors (such as having a new sexual partner) need to be reassessed to determine if screening should be resumed. Some women have medical problems that increase the chance of getting cervical cancer. In these cases, your health care provider may recommend more frequent screening and Pap tests.  The HPV test is an additional test that may be used for cervical  cancer screening. The HPV test looks for the virus that can cause the cell changes on the cervix. The cells collected during the Pap test can be tested for HPV. The HPV test could be used to screen women aged 21 years and older, and should be used in women of any age who have unclear Pap test results. After the age of 18, women should have HPV testing at the same frequency as a Pap test.  Colorectal cancer can be detected and often prevented. Most routine colorectal cancer screening begins at the age of 58 years and continues through age 89 years. However, your health care provider may recommend screening at an earlier age if you have risk factors for colon cancer. On a yearly basis, your health care provider may provide home test kits to check for hidden blood in the stool. Use of a small camera at the end of a tube, to directly examine the colon  (sigmoidoscopy or colonoscopy), can detect the earliest forms of colorectal cancer. Talk to your health care provider about this at age 31, when routine screening begins. Direct exam of the colon should be repeated every 5-10 years through age 37 years, unless early forms of pre-cancerous polyps or small growths are found.  People who are at an increased risk for hepatitis B should be screened for this virus. You are considered at high risk for hepatitis B if:  You were born in a country where hepatitis B occurs often. Talk with your health care provider about which countries are considered high risk.  Your parents were born in a high-risk country and you have not received a shot to protect against hepatitis B (hepatitis B vaccine).  You have HIV or AIDS.  You use needles to inject street drugs.  You live with, or have sex with, someone who has hepatitis B.  You get hemodialysis treatment.  You take certain medicines for conditions like cancer, organ transplantation, and autoimmune conditions.  Hepatitis C blood testing is recommended for all people born from [birth year] through 1965 and any individual with known risks for hepatitis C.  Practice safe sex. Use condoms and avoid high-risk sexual practices to reduce the spread of sexually transmitted infections (STIs). STIs include gonorrhea, chlamydia, syphilis, trichomonas, herpes, HPV, and human immunodeficiency virus (HIV). Herpes, HIV, and HPV are viral illnesses that have no cure. They can result in disability, cancer, and death.  You should be screened for sexually transmitted illnesses (STIs) including gonorrhea and chlamydia if:  You are sexually active and are younger than 24 years.  You are older than 24 years and your health care provider tells you that you are at risk for this type of infection.  Your sexual activity has changed since you were last screened and you are at an increased risk for chlamydia or gonorrhea. Ask your health  care provider if you are at risk.  If you are at risk of being infected with HIV, it is recommended that you take a prescription medicine daily to prevent HIV infection. This is called preexposure prophylaxis (PrEP). You are considered at risk if:  You are a heterosexual woman, are sexually active, and are at increased risk for HIV infection.  You take drugs by injection.  You are sexually active with a partner who has HIV.  Talk with your health care provider about whether you are at high risk of being infected with HIV. If you choose to begin PrEP, you should first be tested for HIV. You should  then be tested every 3 months for as long as you are taking PrEP.  Osteoporosis is a disease in which the bones lose minerals and strength with aging. This can result in serious bone fractures or breaks. The risk of osteoporosis can be identified using a bone density scan. Women ages 22 years and over and women at risk for fractures or osteoporosis should discuss screening with their health care providers. Ask your health care provider whether you should take a calcium supplement or vitamin D to reduce the rate of osteoporosis.  Menopause can be associated with physical symptoms and risks. Hormone replacement therapy is available to decrease symptoms and risks. You should talk to your health care provider about whether hormone replacement therapy is right for you.  Use sunscreen. Apply sunscreen liberally and repeatedly throughout the day. You should seek shade when your shadow is shorter than you. Protect yourself by wearing long sleeves, pants, a wide-brimmed hat, and sunglasses year round, whenever you are outdoors.  Once a month, do a whole body skin exam, using a mirror to look at the skin on your back. Tell your health care provider of new moles, moles that have irregular borders, moles that are larger than a pencil eraser, or moles that have changed in shape or color.  Stay current with required  vaccines (immunizations).  Influenza vaccine. All adults should be immunized every year.  Tetanus, diphtheria, and acellular pertussis (Td, Tdap) vaccine. Pregnant women should receive 1 dose of Tdap vaccine during each pregnancy. The dose should be obtained regardless of the length of time since the last dose. Immunization is preferred during the 27th-36th week of gestation. An adult who has not previously received Tdap or who does not know her vaccine status should receive 1 dose of Tdap. This initial dose should be followed by tetanus and diphtheria toxoids (Td) booster doses every 10 years. Adults with an unknown or incomplete history of completing a 3-dose immunization series with Td-containing vaccines should begin or complete a primary immunization series including a Tdap dose. Adults should receive a Td booster every 10 years.  Varicella vaccine. An adult without evidence of immunity to varicella should receive 2 doses or a second dose if she has previously received 1 dose. Pregnant females who do not have evidence of immunity should receive the first dose after pregnancy. This first dose should be obtained before leaving the health care facility. The second dose should be obtained 4-8 weeks after the first dose.  Human papillomavirus (HPV) vaccine. Females aged 13-26 years who have not received the vaccine previously should obtain the 3-dose series. The vaccine is not recommended for use in pregnant females. However, pregnancy testing is not needed before receiving a dose. If a female is found to be pregnant after receiving a dose, no treatment is needed. In that case, the remaining doses should be delayed until after the pregnancy. Immunization is recommended for any person with an immunocompromised condition through the age of 69 years if she did not get any or all doses earlier. During the 3-dose series, the second dose should be obtained 4-8 weeks after the first dose. The third dose should be  obtained 24 weeks after the first dose and 16 weeks after the second dose.  Zoster vaccine. One dose is recommended for adults aged 69 years or older unless certain conditions are present.  Measles, mumps, and rubella (MMR) vaccine. Adults born before [birth year] generally are considered immune to measles and mumps. Adults born in [birth year]  or later should have 1 or more doses of MMR vaccine unless there is a contraindication to the vaccine or there is laboratory evidence of immunity to each of the three diseases. A routine second dose of MMR vaccine should be obtained at least 28 days after the first dose for students attending postsecondary schools, health care workers, or international travelers. People who received inactivated measles vaccine or an unknown type of measles vaccine during 1963-1967 should receive 2 doses of MMR vaccine. People who received inactivated mumps vaccine or an unknown type of mumps vaccine before 1979 and are at high risk for mumps infection should consider immunization with 2 doses of MMR vaccine. For females of childbearing age, rubella immunity should be determined. If there is no evidence of immunity, females who are not pregnant should be vaccinated. If there is no evidence of immunity, females who are pregnant should delay immunization until after pregnancy. Unvaccinated health care workers born before [birth year] who lack laboratory evidence of measles, mumps, or rubella immunity or laboratory confirmation of disease should consider measles and mumps immunization with 2 doses of MMR vaccine or rubella immunization with 1 dose of MMR vaccine.  Pneumococcal 13-valent conjugate (PCV13) vaccine. When indicated, a person who is uncertain of her immunization history and has no record of immunization should receive the PCV13 vaccine. An adult aged 101 years or older who has certain medical conditions and has not been previously immunized should receive 1 dose of PCV13 vaccine. This PCV13 should be  followed with a dose of pneumococcal polysaccharide (PPSV23) vaccine. The PPSV23 vaccine dose should be obtained at least 8 weeks after the dose of PCV13 vaccine. An adult aged 1 years or older who has certain medical conditions and previously received 1 or more doses of PPSV23 vaccine should receive 1 dose of PCV13. The PCV13 vaccine dose should be obtained 1 or more years after the last PPSV23 vaccine dose.  Pneumococcal polysaccharide (PPSV23) vaccine. When PCV13 is also indicated, PCV13 should be obtained first. All adults aged 65 years and older should be immunized. An adult younger than age 75 years who has certain medical conditions should be immunized. Any person who resides in a nursing home or long-term care facility should be immunized. An adult smoker should be immunized. People with an immunocompromised condition and certain other conditions should receive both PCV13 and PPSV23 vaccines. People with human immunodeficiency virus (HIV) infection should be immunized as soon as possible after diagnosis. Immunization during chemotherapy or radiation therapy should be avoided. Routine use of PPSV23 vaccine is not recommended for American Indians, [place]atives, or people younger than 65 years unless there are medical conditions that require PPSV23 vaccine. When indicated, people who have unknown immunization and have no record of immunization should receive PPSV23 vaccine. One-time revaccination 5 years after the first dose of PPSV23 is recommended for people aged 19-64 years who have chronic kidney failure, nephrotic syndrome, asplenia, or immunocompromised conditions. People who received 1-2 doses of PPSV23 before age 12 years should receive another dose of PPSV23 vaccine at age 61 years or later if at least 5 years have passed since the previous dose. Doses of PPSV23 are not needed for people immunized with PPSV23 at or after age 52 years.  Meningococcal vaccine. Adults with asplenia or persistent  complement component deficiencies should receive 2 doses of quadrivalent meningococcal conjugate (MenACWY-D) vaccine. The doses should be obtained at least 2 months apart. Microbiologists working with certain meningococcal bacteria, [employer] recruits, people at risk during  an outbreak, and people who travel to or live in countries with a high rate of meningitis should be immunized. A first-year college student up through age 62 years who is living in a residence hall should receive a dose if she did not receive a dose on or after her 16th birthday. Adults who have certain high-risk conditions should receive one or more doses of vaccine.  Hepatitis A vaccine. Adults who wish to be protected from this disease, have certain high-risk conditions, work with hepatitis A-infected animals, work in hepatitis A research labs, or travel to or work in countries with a high rate of hepatitis A should be immunized. Adults who were previously unvaccinated and who anticipate close contact with an international adoptee during the first 60 days after arrival in the [place] from a country with a high rate of hepatitis A should be immunized.  Hepatitis B vaccine. Adults who wish to be protected from this disease, have certain high-risk conditions, may be exposed to blood or other infectious body fluids, are household contacts or sex partners of hepatitis B positive people, are clients or workers in certain care facilities, or travel to or work in countries with a high rate of hepatitis B should be immunized.  Haemophilus influenzae type b (Hib) vaccine. A previously unvaccinated person with asplenia or sickle cell disease or having a scheduled splenectomy should receive 1 dose of Hib vaccine. Regardless of previous immunization, a recipient of a hematopoietic stem cell transplant should receive a 3-dose series 6-12 months after her successful transplant. Hib vaccine is not recommended for adults with HIV  infection. Preventive Services / Frequency Ages 83 to 57 years  Blood pressure check.** / Every 1 to 2 years.  Lipid and cholesterol check.** / Every 5 years beginning at age 40.  Clinical breast exam.** / Every 3 years for women in their 66s and 76s.  BRCA-related cancer risk assessment.** / For women who have family members with a BRCA-related cancer (breast, ovarian, tubal, or peritoneal cancers).  Pap test.** / Every 2 years from ages 57 through 44. Every 3 years starting at age 40 through age 70 or 65 with a history of 3 consecutive normal Pap tests.  HPV screening.** / Every 3 years from ages 39 through ages 23 to 22 with a history of 3 consecutive normal Pap tests.  Hepatitis C blood test.** / For any individual with known risks for hepatitis C.  Skin self-exam. / Monthly.  Influenza vaccine. / Every year.  Tetanus, diphtheria, and acellular pertussis (Tdap, Td) vaccine.** / Consult your health care provider. Pregnant women should receive 1 dose of Tdap vaccine during each pregnancy. 1 dose of Td every 10 years.  Varicella vaccine.** / Consult your health care provider. Pregnant females who do not have evidence of immunity should receive the first dose after pregnancy.  HPV vaccine. / 3 doses over 6 months, if 53 and younger. The vaccine is not recommended for use in pregnant females. However, pregnancy testing is not needed before receiving a dose.  Measles, mumps, rubella (MMR) vaccine.** / You need at least 1 dose of MMR if you were born in 1957 or later. You may also need a 2nd dose. For females of childbearing age, rubella immunity should be determined. If there is no evidence of immunity, females who are not pregnant should be vaccinated. If there is no evidence of immunity, females who are pregnant should delay immunization until after pregnancy.  Pneumococcal 13-valent conjugate (PCV13) vaccine.** / Consult  your health care provider.  Pneumococcal polysaccharide  (PPSV23) vaccine.** / 1 to 2 doses if you smoke cigarettes or if you have certain conditions.  Meningococcal vaccine.** / 1 dose if you are age 91 to 21 years and a Market researcher living in a residence hall, or have one of several medical conditions, you need to get vaccinated against meningococcal disease. You may also need additional booster doses.  Hepatitis A vaccine.** / Consult your health care provider.  Hepatitis B vaccine.** / Consult your health care provider.  Haemophilus influenzae type b (Hib) vaccine.** / Consult your health care provider. Ages 36 to 72 years  Blood pressure check.** / Every 1 to 2 years.  Lipid and cholesterol check.** / Every 5 years beginning at age 50 years.  Lung cancer screening. / Every year if you are aged 45-80 years and have a 30-pack-year history of smoking and currently smoke or have quit within the past 15 years. Yearly screening is stopped once you have quit smoking for at least 15 years or develop a health problem that would prevent you from having lung cancer treatment.  Clinical breast exam.** / Every year after age 55 years.  BRCA-related cancer risk assessment.** / For women who have family members with a BRCA-related cancer (breast, ovarian, tubal, or peritoneal cancers).  Mammogram.** / Every year beginning at age 18 years and continuing for as long as you are in good health. Consult with your health care provider.  Pap test.** / Every 3 years starting at age 24 years through age 88 or 57 years with a history of 3 consecutive normal Pap tests.  HPV screening.** / Every 3 years from ages 50 years through ages 32 to 50 years with a history of 3 consecutive normal Pap tests.  Fecal occult blood test (FOBT) of stool. / Every year beginning at age 40 years and continuing until age 60 years. You may not need to do this test if you get a colonoscopy every 10 years.  Flexible sigmoidoscopy or colonoscopy.** / Every 5 years for a  flexible sigmoidoscopy or every 10 years for a colonoscopy beginning at age 16 years and continuing until age 80 years.  Hepatitis C blood test.** / For all people born from [birth year] through 1966 and any individual with known risks for hepatitis C.  Skin self-exam. / Monthly.  Influenza vaccine. / Every year.  Tetanus, diphtheria, and acellular pertussis (Tdap/Td) vaccine.** / Consult your health care provider. Pregnant women should receive 1 dose of Tdap vaccine during each pregnancy. 1 dose of Td every 10 years.  Varicella vaccine.** / Consult your health care provider. Pregnant females who do not have evidence of immunity should receive the first dose after pregnancy.  Zoster vaccine.** / 1 dose for adults aged 53 years or older.  Measles, mumps, rubella (MMR) vaccine.** / You need at least 1 dose of MMR if you were born in 1958 or later. You may also need a 2nd dose. For females of childbearing age, rubella immunity should be determined. If there is no evidence of immunity, females who are not pregnant should be vaccinated. If there is no evidence of immunity, females who are pregnant should delay immunization until after pregnancy.  Pneumococcal 13-valent conjugate (PCV13) vaccine.** / Consult your health care provider.  Pneumococcal polysaccharide (PPSV23) vaccine.** / 1 to 2 doses if you smoke cigarettes or if you have certain conditions.  Meningococcal vaccine.** / Consult your health care provider.  Hepatitis A vaccine.** / Consult your  health care provider.  Hepatitis B vaccine.** / Consult your health care provider.  Haemophilus influenzae type b (Hib) vaccine.** / Consult your health care provider. Ages 40 years and over  Blood pressure check.** / Every 1 to 2 years.  Lipid and cholesterol check.** / Every 5 years beginning at age 28 years.  Lung cancer screening. / Every year if you are aged 53-80 years and have a 30-pack-year history of smoking and currently smoke or have  quit within the past 15 years. Yearly screening is stopped once you have quit smoking for at least 15 years or develop a health problem that would prevent you from having lung cancer treatment.  Clinical breast exam.** / Every year after age 38 years.  BRCA-related cancer risk assessment.** / For women who have family members with a BRCA-related cancer (breast, ovarian, tubal, or peritoneal cancers).  Mammogram.** / Every year beginning at age 30 years and continuing for as long as you are in good health. Consult with your health care provider.  Pap test.** / Every 3 years starting at age 33 years through age 6 or 70 years with 3 consecutive normal Pap tests. Testing can be stopped between 65 and 70 years with 3 consecutive normal Pap tests and no abnormal Pap or HPV tests in the past 10 years.  HPV screening.** / Every 3 years from ages 46 years through ages 72 or 45 years with a history of 3 consecutive normal Pap tests. Testing can be stopped between 65 and 70 years with 3 consecutive normal Pap tests and no abnormal Pap or HPV tests in the past 10 years.  Fecal occult blood test (FOBT) of stool. / Every year beginning at age 9 years and continuing until age 35 years. You may not need to do this test if you get a colonoscopy every 10 years.  Flexible sigmoidoscopy or colonoscopy.** / Every 5 years for a flexible sigmoidoscopy or every 10 years for a colonoscopy beginning at age 60 years and continuing until age 67 years.  Hepatitis C blood test.** / For all people born from [birth year] through 1966 and any individual with known risks for hepatitis C.  Osteoporosis screening.** / A one-time screening for women ages 70 years and over and women at risk for fractures or osteoporosis.  Skin self-exam. / Monthly.  Influenza vaccine. / Every year.  Tetanus, diphtheria, and acellular pertussis (Tdap/Td) vaccine.** / 1 dose of Td every 10 years.  Varicella vaccine.** / Consult your health care  provider.  Zoster vaccine.** / 1 dose for adults aged 20 years or older.  Pneumococcal 13-valent conjugate (PCV13) vaccine.** / Consult your health care provider.  Pneumococcal polysaccharide (PPSV23) vaccine.** / 1 dose for all adults aged 41 years and older.  Meningococcal vaccine.** / Consult your health care provider.  Hepatitis A vaccine.** / Consult your health care provider.  Hepatitis B vaccine.** / Consult your health care provider.  Haemophilus influenzae type b (Hib) vaccine.** / Consult your health care provider. ** Family history and personal history of risk and conditions may change your health care provider's recommendations. Document Released: 12/10/2001 Document Revised: 02/28/2014 Document Reviewed: 03/11/2011 [facility] Patient Information 2015 [place], [place]. This information is not intended to replace advice given to you by your health care provider. Make sure you discuss any questions you have with your health care provider.

## 2015-02-21 NOTE — Progress Notes (Deleted)
   Subjective:    Patient ID: Debra [name], female    DOB: 03/30/1956, 59 y.o.   MRN: [id number]  HPI    Review of Systems  Constitutional: Negative for fever and unexpected weight change.  HENT: Positive for congestion. Negative for dental problem, ear pain, nosebleeds, postnasal drip, rhinorrhea, sinus pressure, sneezing, sore throat and trouble swallowing.        Allergies  Eyes: Positive for itching. Negative for redness.  Respiratory: Positive for shortness of breath ( pt very active with job). Negative for cough, chest tightness and wheezing.   Cardiovascular: Positive for palpitations and leg swelling.  Gastrointestinal: Positive for nausea. Negative for vomiting.  Genitourinary: Negative for dysuria.  Musculoskeletal: Negative for joint swelling.  Skin: Negative for rash.  Neurological: Negative for headaches.  Hematological: Does not bruise/bleed easily.  Psychiatric/Behavioral: Negative for dysphoric mood.       Objective:   Physical Exam        Assessment & Plan:

## 2015-02-21 NOTE — Progress Notes (Signed)
Subjective:     Debra [name] is a 59 y.o. female and is here for a comprehensive physical exam. The patient reports she needs f/u cholesterol , thyroid, anxiety and bp..  History   Social History  . Marital Status: Legally Separated    Spouse Name: N/A  . Number of Children: 3  . Years of Education: N/A   Occupational History  . SECURITY GUARD    Social History Main Topics  . Smoking status: Current Every Day Smoker -- 1.00 packs/day for 20 years    Types: Cigarettes  . Smokeless tobacco: Not on file  . Alcohol Use: 0.0 oz/week    0 Standard drinks or equivalent per week     Comment: 2-3 drinks per week (beer/wine)  . Drug Use: No  . Sexual Activity:    Partners: Male    Birth Control/ Protection: Post-menopausal   Other Topics Concern  . Not on file   Social History Narrative   Health Maintenance  Topic Date Due  . PAP SMEAR  06/05/2013  . MAMMOGRAM  03/23/2015 (Originally 07/10/2013)  . DEXA SCAN  03/23/2015 (Originally 06-Sep-1956)  . INFLUENZA VACCINE  11/08/2015 (Originally 05/29/2015)  . HIV Screening  02/21/2016 (Originally 07/25/1970)  . COLONOSCOPY  07/28/2019  . TETANUS/TDAP  11/02/2020    The following portions of the patient's history were reviewed and updated as appropriate:  She  has a past medical history of GERD (gastroesophageal reflux disease); Anxiety; Thyroid disease; Mixed connective tissue disease; and DDD (degenerative disc disease). She  does not have any pertinent problems on file. She  has past surgical history that includes Carpal tunnel release; Leg Surgery; and Cervical spine surgery. Her family history includes Arthritis in her mother; Breast cancer in her mother; Cancer in her mother; Colon cancer in her maternal grandfather; Diabetes in her sister; Heart disease in her father; Heart disease (age of onset: 25) in her mother; Hyperlipidemia in her sister; Hypertension in her father and sister; Stroke in her father. She  reports that she has  been smoking Cigarettes.  She has a 20 pack-year smoking history. She does not have any smokeless tobacco history on file. She reports that she drinks alcohol. She reports that she does not use illicit drugs. She has a current medication list which includes the following prescription(s): diazepam, gabapentin, levothyroxine, metoprolol succinate, paroxetine, ranitidine, and ferrous sulfate. Current Outpatient Prescriptions on File Prior to Visit  Medication Sig Dispense Refill  . diazepam (VALIUM) 5 MG tablet Take 1 tablet (5 mg total) by mouth every 6 (six) hours as needed for anxiety (spasms). 15 tablet 0  . gabapentin (NEURONTIN) 600 MG tablet Take 1 tablet (600 mg total) by mouth 3 (three) times daily. 90 tablet 3  . levothyroxine (SYNTHROID, LEVOTHROID) 50 MCG tablet Take 1 tablet (50 mcg total) by mouth daily before breakfast. Repeat labs are due now 30 tablet 0  . ranitidine (ZANTAC) 150 MG capsule TAKE ONE CAPSULE BY MOUTH TWICE DAILY 60 capsule 5   No current facility-administered medications on file prior to visit.   She is allergic to codeine..  Review of Systems Review of Systems  Constitutional: Negative for activity change, appetite change and fatigue.  HENT: Negative for hearing loss, congestion, tinnitus and ear discharge.  dentist q72m Eyes: Negative for visual disturbance (see optho -due) Respiratory: Negative for cough, chest tightness and shortness of breath.   Cardiovascular: Negative for chest pain, palpitations and leg swelling.  Gastrointestinal: Negative for abdominal pain, diarrhea, constipation  and abdominal distention.  Genitourinary: Negative for urgency, frequency, decreased urine volume and difficulty urinating.  Musculoskeletal: Negative for back pain, arthralgias and gait problem.  Skin: Negative for color change, pallor and rash.  Neurological: Negative for dizziness, light-headedness, numbness and headaches.  Hematological: Negative for adenopathy. Does not  bruise/bleed easily.  Psychiatric/Behavioral: Negative for suicidal ideas, confusion, sleep disturbance, self-injury, dysphoric mood, decreased concentration and agitation.       Objective:    BP 123/78 mmHg  Pulse 76  Temp(Src) 98.8 F (37.1 C) (Oral)  Ht 5' 4.5" (1.638 m)  Wt 151 lb 6.4 oz (68.675 kg)  BMI 25.60 kg/m2  SpO2 96% General appearance: alert, cooperative, appears stated age and no distress Head: Normocephalic, without obvious abnormality, atraumatic Eyes: conjunctivae/corneas clear. PERRL, EOM's intact. Fundi benign. Ears: normal TM's and external ear canals both ears Nose: Nares normal. Septum midline. Mucosa normal. No drainage or sinus tenderness. Throat: lips, mucosa, and tongue normal; teeth and gums normal Neck: no adenopathy, no carotid bruit, no JVD, supple, symmetrical, trachea midline and thyroid not enlarged, symmetric, no tenderness/mass/nodules Back: symmetric, no curvature. ROM normal. No CVA tenderness. Lungs: clear to auscultation bilaterally Breasts: normal appearance, no masses or tenderness Heart: regular rate and rhythm, S1, S2 normal, no murmur, click, rub or gallop Abdomen: soft, non-tender; bowel sounds normal; no masses,  no organomegaly Pelvic: cervix normal in appearance, external genitalia normal, no adnexal masses or tenderness, no cervical motion tenderness, rectovaginal septum normal, uterus normal size, shape, and consistency, vagina normal without discharge and pap done, rectal heme neg brown stool Extremities: extremities normal, atraumatic, no cyanosis or edema Pulses: 2+ and symmetric Skin: Skin color, texture, turgor normal. No rashes or lesions Lymph nodes: Cervical, supraclavicular, and axillary nodes normal. Neurologic: Alert and oriented X 3, normal strength and tone. Normal symmetric reflexes. Normal coordination and gait Psych-- no depression, no anxiety      Assessment:    Healthy female exam.      Plan:    ghm  utd Check labs See After Visit Summary for Counseling Recommendations    1. Generalized anxiety disorder   - PARoxetine (PAXIL) 30 MG tablet; Take 1 tablet (30 mg total) by mouth daily.  Dispense: 90 tablet; Refill: 3  2. Essential hypertension  - metoprolol succinate (TOPROL-XL) 50 MG 24 hr tablet; TAKE ONE TABLET BY MOUTH TWICE DAILY  Dispense: 60 tablet; Refill: 5 - Basic metabolic panel - CBC with Differential/Platelet - Hepatic function panel - Lipid panel - POCT urinalysis dipstick - TSH - HIV antibod  3. Estrogen deficiency  - DG Bone Density; Future  4. Encounter for screening mammogram for breast cancer  - MM DIGITAL SCREENING BILATERAL; Future  5. Anemia, unspecified anemia type  - CBC with Differential/Platelet  6. Preventative health care  - Basic metabolic panel - CBC with Differential/Platelet - Hepatic function panel - Lipid panel - POCT urinalysis dipstick - TSH - HIV antibody - Fecal occult blood, imunochemical; Future  7. Hypothyroidism, unspecified hypothyroidism type  - TSH

## 2015-02-22 LAB — HIV ANTIBODY (ROUTINE TESTING W REFLEX): HIV 1&2 Ab, 4th Generation: NONREACTIVE

## 2015-02-22 LAB — CYTOLOGY - PAP

## 2015-02-23 ENCOUNTER — Other Ambulatory Visit (HOSPITAL_COMMUNITY): Admission: RE | Admit: 2015-02-23 | Payer: 59 | Source: Ambulatory Visit | Admitting: Family Medicine

## 2015-02-24 ENCOUNTER — Telehealth: Payer: Self-pay | Admitting: Pulmonary Disease

## 2015-02-24 NOTE — Telephone Encounter (Signed)
Lab Results  Component Value Date   TSH 0.93 02/21/2015    Lab Results  Component Value Date   ALT 17 02/21/2015   AST 23 02/21/2015   ALKPHOS 105 02/21/2015   BILITOT 0.3 02/21/2015    BMET    Component Value Date/Time   NA 139 02/21/2015 1417   K 4.0 02/21/2015 1417   CL 105 02/21/2015 1417   CO2 31 02/21/2015 1417   GLUCOSE 73 02/21/2015 1417   GLUCOSE 90 10/20/2006 1125   BUN 10 02/21/2015 1417   CREATININE 0.60 02/21/2015 1417   CREATININE 0.80 07/10/2012 1715   CALCIUM 10.0 02/21/2015 1417   GFRNONAA >90 08/31/2014 1720   GFRAA >90 08/31/2014 1720    CBC    Component Value Date/Time   WBC 6.0 02/21/2015 1417   RBC 4.27 02/21/2015 1417   HGB 11.9* 02/21/2015 1417   HCT 35.4* 02/21/2015 1417   PLT 251.0 02/21/2015 1417   MCV 82.8 02/21/2015 1417   MCH 27.9 08/31/2014 1720   MCHC 33.6 02/21/2015 1417   RDW 14.3 02/21/2015 1417   LYMPHSABS 2.0 02/21/2015 1417   MONOABS 0.3 02/21/2015 1417   EOSABS 0.1 02/21/2015 1417   BASOSABS 0.0 02/21/2015 1417    Iron/TIBC/Ferritin/ %Sat    Component Value Date/Time   IRON 69 02/21/2015 1225   TIBC 290 02/21/2015 1225   FERRITIN 78.8 02/21/2015 1225   IRONPCTSAT 24 02/21/2015 1225   IRONPCTSAT 14.6* 01/11/2014 1159    Will have my nurse inform pt that lab tests were normal.    No change to current tx plan.

## 2015-02-24 NOTE — Telephone Encounter (Signed)
Results have been explained to patient, pt expressed understanding.  Pt is scheduled for Sleep Study on May 15th but is unable to d/t work conflict - needing to reschedule appt.  Pt states that she has been working with sleep center to find another day - will call if anything needed from our office.  Nothing further needed.

## 2015-02-27 NOTE — Telephone Encounter (Signed)
Looks like message was created in error Will close message.

## 2015-03-03 ENCOUNTER — Telehealth: Payer: Self-pay | Admitting: Pulmonary Disease

## 2015-03-03 DIAGNOSIS — G4710 Hypersomnia, unspecified: Secondary | ICD-10-CM

## 2015-03-03 NOTE — Telephone Encounter (Signed)
Pt's insurance requiring pt to do home sleep study instead of in lab sleep study.  I have placed order for home sleep study.  Will have my nurse inform pt about change in testing due to insurance restrictions.

## 2015-03-03 NOTE — Telephone Encounter (Signed)
Pt returned call - [phone]

## 2015-03-03 NOTE — Telephone Encounter (Signed)
-----   Message from Joellen Jersey sent at 03/02/2015  4:33 PM EDT ----- Dr Halford Chessman this pt's [place] wants her to do a HST 1st i need an order for it thanks Joellen Jersey

## 2015-03-03 NOTE — Telephone Encounter (Signed)
Called and spoke to pt. Informed pt of the change. Pt verbalized understanding and aware [facility] will contact them. Pt will need a week notice prior to sleep study to take off work.   [facility]'s please advise on HST. Thanks.

## 2015-03-03 NOTE — Telephone Encounter (Signed)
lmtcb x1 for pt. 

## 2015-03-06 ENCOUNTER — Encounter: Payer: 59 | Admitting: Family Medicine

## 2015-03-07 ENCOUNTER — Other Ambulatory Visit: Payer: 59

## 2015-03-07 ENCOUNTER — Ambulatory Visit: Payer: 59

## 2015-03-12 ENCOUNTER — Encounter (HOSPITAL_BASED_OUTPATIENT_CLINIC_OR_DEPARTMENT_OTHER): Payer: 59

## 2015-03-13 ENCOUNTER — Telehealth: Payer: Self-pay | Admitting: Pulmonary Disease

## 2015-03-13 NOTE — Telephone Encounter (Signed)
Spoke with pt. She is needing to schedule home sleep study.  [facility] - please advise. Thanks.

## 2015-03-14 NOTE — Telephone Encounter (Signed)
Attempted to contact patient back on HST that has been scheduled to be pick up today between 2:00 - 3:30. LMOAM for pt to return my call if she needs to r/s this test or if she has any questions to [phone]. Rhonda J Cobb

## 2015-03-14 NOTE — Telephone Encounter (Signed)
Called and spoke with patient to confirm pick up for HST Device for today between 2:30 -4:30. Pt advised to ask for Rodena Piety as she will be setting patient up. Pt voiced understanding and stated that she would be there today to pick up device. Nothing else needed at this time. Rhonda J Cobb

## 2015-03-15 DIAGNOSIS — G4730 Sleep apnea, unspecified: Secondary | ICD-10-CM | POA: Diagnosis not present

## 2015-03-16 ENCOUNTER — Telehealth: Payer: Self-pay | Admitting: Pulmonary Disease

## 2015-03-16 DIAGNOSIS — G4730 Sleep apnea, unspecified: Secondary | ICD-10-CM | POA: Diagnosis not present

## 2015-03-16 NOTE — Telephone Encounter (Signed)
HST 03/15/15 >> AHI 5.2, SaO2 low 92%.  Will have my nurse inform pt that sleep study shows very mild sleep apnea.  Please schedule ROV to discuss in more detail.

## 2015-03-17 NOTE — Telephone Encounter (Signed)
lmtcb for pt.  

## 2015-03-21 ENCOUNTER — Other Ambulatory Visit: Payer: Self-pay | Admitting: Family Medicine

## 2015-03-21 DIAGNOSIS — N6320 Unspecified lump in the left breast, unspecified quadrant: Secondary | ICD-10-CM

## 2015-03-21 NOTE — Telephone Encounter (Signed)
Spoke with pt. She is aware of her results and agrees to come back in for ROV.  VS - are you okay with double booking your schedule?

## 2015-03-21 NOTE — Telephone Encounter (Signed)
Pt returned call - [phone]

## 2015-03-22 NOTE — Telephone Encounter (Signed)
Yes

## 2015-03-22 NOTE — Telephone Encounter (Signed)
lmtcb x1 for pt. 

## 2015-03-27 ENCOUNTER — Other Ambulatory Visit: Payer: Self-pay | Admitting: Family Medicine

## 2015-03-28 ENCOUNTER — Other Ambulatory Visit: Payer: Self-pay | Admitting: *Deleted

## 2015-03-28 DIAGNOSIS — G4710 Hypersomnia, unspecified: Secondary | ICD-10-CM

## 2015-04-03 NOTE — Telephone Encounter (Signed)
Pt scheduled for appt with VS 04/05/15 at 3:15 Nothing further needed.

## 2015-04-04 ENCOUNTER — Ambulatory Visit
Admission: RE | Admit: 2015-04-04 | Discharge: 2015-04-04 | Disposition: A | Discharge status: Home / Self Care | Payer: 59 | Source: Ambulatory Visit | Attending: Family Medicine | Admitting: Family Medicine

## 2015-04-04 DIAGNOSIS — N6320 Unspecified lump in the left breast, unspecified quadrant: Secondary | ICD-10-CM

## 2015-04-04 DIAGNOSIS — E2839 Other primary ovarian failure: Secondary | ICD-10-CM

## 2015-04-05 ENCOUNTER — Ambulatory Visit (INDEPENDENT_AMBULATORY_CARE_PROVIDER_SITE_OTHER): Payer: 59 | Admitting: Pulmonary Disease

## 2015-04-05 ENCOUNTER — Encounter: Payer: Self-pay | Admitting: Pulmonary Disease

## 2015-04-05 ENCOUNTER — Telehealth: Payer: Self-pay | Admitting: Family Medicine

## 2015-04-05 VITALS — BP 122/86 | HR 99 | Ht 64.5 in | Wt 160.0 lb

## 2015-04-05 DIAGNOSIS — G4733 Obstructive sleep apnea (adult) (pediatric): Secondary | ICD-10-CM | POA: Diagnosis not present

## 2015-04-05 NOTE — Progress Notes (Signed)
Chief Complaint  Patient presents with  . Follow-up    Discuss sleep study results    History of Present Illness: Debra [name] is a 59 y.o. female with mild OSA.  She is here to review her sleep study.  This showed mild sleep apnea.  She is still having trouble with her sleep.  She has more trouble with back and leg pain.  She has not f/u with behavioral therapy.  She still sometimes drinks wine before going to bed.   TESTS: HST 03/15/15 >> AHI 5.2, SaO2 low 92%.  Past medical hx >> GERD, Hypothyroidism, MCTD, Anxiety, Depression  Past surgical hx, Medications, Allergies, Family hx, Social hx all reviewed.   Physical Exam: Blood pressure 122/86, pulse 99, height 5' 4.5" (1.638 m), weight 160 lb (72.576 kg), SpO2 99 %. Body mass index is 27.05 kg/(m^2).  General - No distress ENT - No sinus tenderness, no oral exudate, no LAN, over bite, MP 4, scalloped tongue Cardiac - s1s2 regular, no murmur Chest - No wheeze/rales/dullness Back - No focal tenderness Abd - Soft, non-tender Ext - No edema Neuro - Normal strength Skin - No rashes Psych - normal mood, and behavior   Assessment/Plan:  Obstructive sleep apnea. Plan: - will arrange for auto CPAP set up  Insomnia Plan: - she is to follow up with behavioral therapy - advised her to avoid drinking alcohol too close to bedtime   Chesley Mires, MD [place] Pager:  [phone]

## 2015-04-05 NOTE — Telephone Encounter (Signed)
Caller name: Cicley Ganesh Relationship to patient: self Can be reached: [phone] Pharmacy:  Reason for call: Pt states that she is still having the same pains in neck and arms and legs. She states she had a pain shot and she thinks she needs to go back to [facility]. Please enter referral or let me know if pt needs to be scheduled for another appt here first.

## 2015-04-05 NOTE — Patient Instructions (Signed)
Will arrange for CPAP set up  Follow up in 2 months after CPAP set up 

## 2015-04-06 ENCOUNTER — Encounter: Payer: Self-pay | Admitting: Family Medicine

## 2015-04-06 NOTE — Telephone Encounter (Signed)
Spoke with patient and made her aware that she will need to call Dr.Bortero's office and I gave her the number, she has agreed to do so.       KP

## 2015-04-06 NOTE — Telephone Encounter (Signed)
A referral was put in in January for Dr Joya Salm---  If she has been seen in last year she should not need a referral

## 2015-04-06 NOTE — Telephone Encounter (Signed)
Please advise      KP 

## 2015-04-10 ENCOUNTER — Telehealth: Payer: Self-pay | Admitting: Pulmonary Disease

## 2015-04-10 NOTE — Telephone Encounter (Signed)
Order was sent to [facility] on 04/05/15. Called [facility] and spoke with Jeani Hawking. They have order and is working on it.  Pt is aware. Nothing further needed

## 2015-05-05 ENCOUNTER — Other Ambulatory Visit: Payer: Self-pay | Admitting: Neurosurgery

## 2015-05-05 DIAGNOSIS — M5416 Radiculopathy, lumbar region: Secondary | ICD-10-CM

## 2015-05-16 ENCOUNTER — Other Ambulatory Visit: Payer: 59

## 2015-05-18 ENCOUNTER — Telehealth: Payer: Self-pay | Admitting: Family Medicine

## 2015-05-18 DIAGNOSIS — G8929 Other chronic pain: Secondary | ICD-10-CM

## 2015-05-18 MED ORDER — GABAPENTIN 600 MG PO TABS: 600.0000 mg | ORAL_TABLET | Freq: Three times a day (TID) | ORAL | Status: DC

## 2015-05-18 NOTE — Telephone Encounter (Signed)
Relation to pt: self Call back number:[phone] Pharmacy: [facility] [place], [place]. [phone] (Phone) [phone] (Fax)          Reason for call:  Patient requesting a refill gabapentin (NEURONTIN) 600 MG tablet   *pt wanted to inform MD that urgent care recommended worked out, patient did not know the name of urgent care*

## 2015-05-18 NOTE — Telephone Encounter (Signed)
Spoke with patient who states she is on her way to [facility].

## 2015-05-18 NOTE — Telephone Encounter (Signed)
[facility] - Client [place] Patient Name: Debra [name] DOB: 01-01-1956 Initial Comment Caller states fell out of bed last night, got a cut on the bridge of her nose, wants to know if she should be seen Nurse Assessment Nurse: Mechele Dawley, RN, Amy Date/Time (Eastern Time): 05/18/2015 1:19:09 PM Confirm and document reason for call. If symptomatic, describe symptoms. ---CALLER STATES THAT SHE FELL OUT OF THE BED LAST NIGHT AND SHE WAS ON THE EDGE OF THE BED LAST NIGHT AND SHE TURNED ALL THE WAY AND HER HEAD AND THE BRIDGE OF HER NOSE GOT CUT ON THE EDGE OF HER DESK. SHE STOPPED IT BLEEDING. AND SHE HIT THE EDGE OF THE DESK AND HER HEAD ALSO. SHE HAS A KNOT ON HER HEAD. SHE STATES IT IS STILL SWOLLEN AND HER NOSE IS STILL TRYING TO BLEED THROUGH THE BANDAID THAT SHE HAS ON IT. SHE IS NOT ON ANY BLOOD THINNERS. Has the patient traveled out of the country within the last 30 days? ---Not Applicable Does the patient require triage? ---Yes Related visit to physician within the last 2 weeks? ---No Does the PT have any chronic conditions? (i.e. diabetes, asthma, etc.) ---Yes List chronic conditions. ---[facility] DIAB[employer] Guidelines Guideline Title Affirmed Question Affirmed Notes Head Injury Skin is split open or gaping (or length > 1/2 inch or 12 mm) cut that she is worried about Final Disposition User Go to ED Now [place], Therapist, sports, Amy Comments CALLER HAS A CUT ON THE BRIDGE OF HER NOSE AND IT WAS STOPPED BLEEDING, THEN SHE STATES THAT IT STARTED BLEEDING AGAIN. WAS GOING TO GET IN HER WITH WITH PCP BUT INSTRUCTED SHE NEEDS TO [place] INTO THE ED. Referrals GO TO FACILITY UNDECIDED GO TO FACILITY UNDECIDED Disagree/Comply: Comply

## 2015-06-13 ENCOUNTER — Encounter: Payer: Self-pay | Admitting: Family Medicine

## 2015-06-13 ENCOUNTER — Ambulatory Visit (INDEPENDENT_AMBULATORY_CARE_PROVIDER_SITE_OTHER): Payer: 59 | Admitting: Family Medicine

## 2015-06-13 ENCOUNTER — Ambulatory Visit: Payer: 59 | Admitting: Pulmonary Disease

## 2015-06-13 VITALS — BP 124/72 | HR 61 | Temp 98.1°F | Wt 164.0 lb

## 2015-06-13 DIAGNOSIS — S3992XA Unspecified injury of lower back, initial encounter: Secondary | ICD-10-CM

## 2015-06-13 MED ORDER — TRAMADOL HCL 50 MG PO TABS: 50.0000 mg | ORAL_TABLET | Freq: Four times a day (QID) | ORAL | Status: DC | PRN

## 2015-06-13 NOTE — Patient Instructions (Signed)
Back Pain, Adult Low back pain is very common. About 1 in 5 people have back pain.The cause of low back pain is rarely dangerous. The pain often gets better over time.About half of people with a sudden onset of back pain feel better in just 2 weeks. About 8 in 10 people feel better by 6 weeks.  CAUSES Some common causes of back pain include:  Strain of the muscles or ligaments supporting the spine.  Wear and tear (degeneration) of the spinal discs.  Arthritis.  Direct injury to the back. DIAGNOSIS Most of the time, the direct cause of low back pain is not known.However, back pain can be treated effectively even when the exact cause of the pain is unknown.Answering your caregiver's questions about your overall health and symptoms is one of the most accurate ways to make sure the cause of your pain is not dangerous. If your caregiver needs more information, he or she may order lab work or imaging tests (X-rays or MRIs).However, even if imaging tests show changes in your back, this usually does not require surgery. HOME CARE INSTRUCTIONS For many people, back pain returns.Since low back pain is rarely dangerous, it is often a condition that people can learn to manageon their own.   Remain active. It is stressful on the back to sit or stand in one place. Do not sit, drive, or stand in one place for more than 30 minutes at a time. Take short walks on level surfaces as soon as pain allows.Try to increase the length of time you walk each day.  Do not stay in bed.Resting more than 1 or 2 days can delay your recovery.  Do not avoid exercise or work.Your body is made to move.It is not dangerous to be active, even though your back may hurt.Your back will likely heal faster if you return to being active before your pain is gone.  Pay attention to your body when you bend and lift. Many people have less discomfortwhen lifting if they bend their knees, keep the load close to their bodies,and  avoid twisting. Often, the most comfortable positions are those that put less stress on your recovering back.  Find a comfortable position to sleep. Use a firm mattress and lie on your side with your knees slightly bent. If you lie on your back, put a pillow under your knees.  Only take over-the-counter or prescription medicines as directed by your caregiver. Over-the-counter medicines to reduce pain and inflammation are often the most helpful.Your caregiver may prescribe muscle relaxant drugs.These medicines help dull your pain so you can more quickly return to your normal activities and healthy exercise.  Put ice on the injured area.  Put ice in a plastic bag.  Place a towel between your skin and the bag.  Leave the ice on for 15-20 minutes, 03-04 times a day for the first 2 to 3 days. After that, ice and heat may be alternated to reduce pain and spasms.  Ask your caregiver about trying back exercises and gentle massage. This may be of some benefit.  Avoid feeling anxious or stressed.Stress increases muscle tension and can worsen back pain.It is important to recognize when you are anxious or stressed and learn ways to manage it.Exercise is a great option. SEEK MEDICAL CARE IF:  You have pain that is not relieved with rest or medicine.  You have pain that does not improve in 1 week.  You have new symptoms.  You are generally not feeling well. SEEK   IMMEDIATE MEDICAL CARE IF:   You have pain that radiates from your back into your legs.  You develop new bowel or bladder control problems.  You have unusual weakness or numbness in your arms or legs.  You develop nausea or vomiting.  You develop abdominal pain.  You feel faint. Document Released: 10/14/2005 Document Revised: 04/14/2012 Document Reviewed: 02/15/2014 [employer] Patient Information 2015 [employer]. This information is not intended to replace advice given to you by your health care provider. Make sure you  discuss any questions you have with your health care provider.  

## 2015-06-13 NOTE — Progress Notes (Signed)
Pre visit review using our clinic review tool, if applicable. No additional management support is needed unless otherwise documented below in the visit note. 

## 2015-06-13 NOTE — Progress Notes (Signed)
Patient ID: Debra [name], female   DOB: 20-Feb-1956, 59 y.o.   MRN: [id number]   Subjective:    Patient ID: Debra [name], female    DOB: [birth year], 59 y.o.   MRN: [id number]  Chief Complaint  Patient presents with  . Back Pain    C/o low Back and Left Hip pain that is gradually getting worst  . Hip Pain  . Suture / Staple Removal    From the face. Sutures have been in since 05/18/15    HPI Patient is in today for f/u fall about 3 weeks ago.  She had a laceration to her nose and was supposed to return for suture removal 5 days later but says she didn't know.  The patient is now also c/o worsening low back pain radiates to R hip.  + weakness in legs .   Past Medical History  Diagnosis Date  . GERD (gastroesophageal reflux disease)   . Anxiety   . Thyroid disease     Hypothyroidism  . Mixed connective tissue disease     with Raynaud's  . DDD (degenerative disc disease)     Past Surgical History  Procedure Laterality Date  . Carpal tunnel release      Bilateral  . Leg surgery      Right   . Cervical spine surgery      x3    Family History  Problem Relation Age of Onset  . Colon cancer Maternal Grandfather   . Breast cancer Mother   . Arthritis Mother     rheumatoid  . Cancer Mother     breast  . Heart disease Mother 41    MI  . Heart disease Father     MI  . Hypertension Father   . Stroke Father   . Diabetes Sister   . Hypertension Sister   . Hyperlipidemia Sister     Social History   Social History  . Marital Status: Legally Separated    Spouse Name: N/A  . Number of Children: 3  . Years of Education: N/A   Occupational History  . SECURITY GUARD    Social History Main Topics  . Smoking status: Current Every Day Smoker -- 1.00 packs/day for 20 years    Types: Cigarettes  . Smokeless tobacco: Not on file  . Alcohol Use: 0.0 oz/week    0 Standard drinks or equivalent per week     Comment: 2-3 drinks per week (beer/wine)  . Drug Use: No  .  Sexual Activity:    Partners: Male    Birth Control/ Protection: Post-menopausal   Other Topics Concern  . Not on file   Social History Narrative    Outpatient Prescriptions Prior to Visit  Medication Sig Dispense Refill  . ferrous sulfate (SLOW FE) 160 (50 FE) MG TBCR SR tablet Take 1 tablet (160 mg total) by mouth daily. 90 each 3  . gabapentin (NEURONTIN) 600 MG tablet Take 1 tablet (600 mg total) by mouth 3 (three) times daily. 90 tablet 3  . levothyroxine (SYNTHROID, LEVOTHROID) 50 MCG tablet Take 1 tablet (50 mcg total) by mouth daily before breakfast. 30 tablet 5  . metoprolol succinate (TOPROL-XL) 50 MG 24 hr tablet TAKE ONE TABLET BY MOUTH TWICE DAILY 60 tablet 5  . PARoxetine (PAXIL) 30 MG tablet Take 1 tablet (30 mg total) by mouth daily. 90 tablet 3  . ranitidine (ZANTAC) 150 MG capsule TAKE ONE CAPSULE BY MOUTH TWICE DAILY (Patient taking differently: TAKE  ONE CAPSULE BY MOUTH PRN) 60 capsule 5   No facility-administered medications prior to visit.    Allergies  Allergen Reactions  . Codeine Rash and Other (See Comments)    dizziness    Review of Systems  Constitutional: Negative for fever and malaise/fatigue.  HENT: Negative for congestion.   Eyes: Negative for discharge.  Respiratory: Negative for shortness of breath.   Cardiovascular: Negative for chest pain, palpitations and leg swelling.  Gastrointestinal: Negative for nausea and abdominal pain.  Genitourinary: Negative for dysuria.  Musculoskeletal: Positive for back pain. Negative for falls.  Skin: Negative for rash.  Neurological: Negative for loss of consciousness and headaches.  Endo/Heme/Allergies: Negative for environmental allergies.  Psychiatric/Behavioral: Negative for depression. The patient is not nervous/anxious.        Objective:    Physical Exam  BP 124/72 mmHg  Pulse 61  Temp(Src) 98.1 F (36.7 C) (Oral)  Wt 164 lb (74.39 kg)  SpO2 96% Wt Readings from Last 3 Encounters:    06/13/15 164 lb (74.39 kg)  04/05/15 160 lb (72.576 kg)  02/21/15 151 lb 6.4 oz (68.675 kg)     Lab Results  Component Value Date   WBC 6.0 02/21/2015   HGB 11.9* 02/21/2015   HCT 35.4* 02/21/2015   PLT 251.0 02/21/2015   GLUCOSE 73 02/21/2015   CHOL 180 02/21/2015   TRIG 110.0 02/21/2015   HDL 59.80 02/21/2015   LDLDIRECT 126.3 06/06/2011   LDLCALC 98 02/21/2015   ALT 17 02/21/2015   AST 23 02/21/2015   NA 139 02/21/2015   K 4.0 02/21/2015   CL 105 02/21/2015   CREATININE 0.60 02/21/2015   BUN 10 02/21/2015   CO2 31 02/21/2015   TSH 0.93 02/21/2015   HGBA1C 6.4 11/07/2014   MICROALBUR 3.1* 11/07/2014    Lab Results  Component Value Date   TSH 0.93 02/21/2015   Lab Results  Component Value Date   WBC 6.0 02/21/2015   HGB 11.9* 02/21/2015   HCT 35.4* 02/21/2015   MCV 82.8 02/21/2015   PLT 251.0 02/21/2015   Lab Results  Component Value Date   NA 139 02/21/2015   K 4.0 02/21/2015   CO2 31 02/21/2015   GLUCOSE 73 02/21/2015   BUN 10 02/21/2015   CREATININE 0.60 02/21/2015   BILITOT 0.3 02/21/2015   ALKPHOS 105 02/21/2015   AST 23 02/21/2015   ALT 17 02/21/2015   PROT 7.1 02/21/2015   ALBUMIN 4.1 02/21/2015   CALCIUM 10.0 02/21/2015   ANIONGAP 11 08/31/2014   GFR 131.33 02/21/2015   Lab Results  Component Value Date   CHOL 180 02/21/2015   Lab Results  Component Value Date   HDL 59.80 02/21/2015   Lab Results  Component Value Date   LDLCALC 98 02/21/2015   Lab Results  Component Value Date   TRIG 110.0 02/21/2015   Lab Results  Component Value Date   CHOLHDL 3 02/21/2015   Lab Results  Component Value Date   HGBA1C 6.4 11/07/2014       Assessment & Plan:   Problem List Items Addressed This Visit    None      I am having [name] maintain her ranitidine, PARoxetine, metoprolol succinate, ferrous sulfate, levothyroxine, and gabapentin.  No orders of the defined types were placed in this encounter.     Elizabeth Sauer,  LPN

## 2015-06-14 NOTE — Progress Notes (Signed)
Patient ID: Debra [name], female    DOB: July 25, 1956  Age: 59 y.o. MRN: [id number]    Subjective:  Subjective HPI Debra [name] presents with c/o low back pain  Review of Systems  Constitutional: Negative for diaphoresis, appetite change, fatigue and unexpected weight change.  Eyes: Negative for pain, redness and visual disturbance.  Respiratory: Negative for cough, chest tightness, shortness of breath and wheezing.   Cardiovascular: Negative for chest pain, palpitations and leg swelling.  Endocrine: Negative for cold intolerance, heat intolerance, polydipsia, polyphagia and polyuria.  Genitourinary: Negative for dysuria, frequency and difficulty urinating.  Musculoskeletal: Positive for back pain, joint swelling and arthralgias. Negative for gait problem, neck pain and neck stiffness.  Neurological: Negative for dizziness, light-headedness, numbness and headaches.    History Past Medical History  Diagnosis Date  . GERD (gastroesophageal reflux disease)   . Anxiety   . Thyroid disease     Hypothyroidism  . Mixed connective tissue disease     with Raynaud's  . DDD (degenerative disc disease)     She has past surgical history that includes Carpal tunnel release; Leg Surgery; and Cervical spine surgery.   Her family history includes Arthritis in her mother; Breast cancer in her mother; Cancer in her mother; Colon cancer in her maternal grandfather; Diabetes in her sister; Heart disease in her father; Heart disease (age of onset: 12) in her mother; Hyperlipidemia in her sister; Hypertension in her father and sister; Stroke in her father.She reports that she has been smoking Cigarettes.  She has a 20 pack-year smoking history. She does not have any smokeless tobacco history on file. She reports that she drinks alcohol. She reports that she does not use illicit drugs.  Current Outpatient Prescriptions on File Prior to Visit  Medication Sig Dispense Refill  . ferrous sulfate (SLOW  FE) 160 (50 FE) MG TBCR SR tablet Take 1 tablet (160 mg total) by mouth daily. 90 each 3  . gabapentin (NEURONTIN) 600 MG tablet Take 1 tablet (600 mg total) by mouth 3 (three) times daily. 90 tablet 3  . levothyroxine (SYNTHROID, LEVOTHROID) 50 MCG tablet Take 1 tablet (50 mcg total) by mouth daily before breakfast. 30 tablet 5  . metoprolol succinate (TOPROL-XL) 50 MG 24 hr tablet TAKE ONE TABLET BY MOUTH TWICE DAILY 60 tablet 5  . PARoxetine (PAXIL) 30 MG tablet Take 1 tablet (30 mg total) by mouth daily. 90 tablet 3  . ranitidine (ZANTAC) 150 MG capsule TAKE ONE CAPSULE BY MOUTH TWICE DAILY (Patient taking differently: TAKE ONE CAPSULE BY MOUTH PRN) 60 capsule 5   No current facility-administered medications on file prior to visit.     Objective:  Objective Physical Exam  Constitutional: She is oriented to person, place, and time. She appears well-developed and well-nourished.  HENT:  Head: Normocephalic and atraumatic.  Eyes: Conjunctivae and EOM are normal.  Neck: Normal range of motion. Neck supple. No JVD present. Carotid bruit is not present. No thyromegaly present.  Cardiovascular: Normal rate, regular rhythm and normal heart sounds.   No murmur heard. Pulmonary/Chest: Effort normal and breath sounds normal. No respiratory distress. She has no wheezes. She has no rales. She exhibits no tenderness.  Musculoskeletal: She exhibits tenderness. She exhibits no edema.       Lumbar back: She exhibits decreased range of motion, tenderness, pain and spasm. She exhibits no bony tenderness, no swelling, no edema, no deformity, no laceration and normal pulse.  Neurological: She is alert and oriented to  person, place, and time.  Psychiatric: She has a normal mood and affect.   BP 124/72 mmHg  Pulse 61  Temp(Src) 98.1 F (36.7 C) (Oral)  Wt 164 lb (74.39 kg)  SpO2 96% Wt Readings from Last 3 Encounters:  06/13/15 164 lb (74.39 kg)  04/05/15 160 lb (72.576 kg)  02/21/15 151 lb 6.4 oz  (68.675 kg)     Lab Results  Component Value Date   WBC 6.0 02/21/2015   HGB 11.9* 02/21/2015   HCT 35.4* 02/21/2015   PLT 251.0 02/21/2015   GLUCOSE 73 02/21/2015   CHOL 180 02/21/2015   TRIG 110.0 02/21/2015   HDL 59.80 02/21/2015   LDLDIRECT 126.3 06/06/2011   LDLCALC 98 02/21/2015   ALT 17 02/21/2015   AST 23 02/21/2015   NA 139 02/21/2015   K 4.0 02/21/2015   CL 105 02/21/2015   CREATININE 0.60 02/21/2015   BUN 10 02/21/2015   CO2 31 02/21/2015   TSH 0.93 02/21/2015   HGBA1C 6.4 11/07/2014   MICROALBUR 3.1* 11/07/2014    No results found.   Assessment & Plan:  Plan I am having Debra [name] start on traMADol. I am also having her maintain her ranitidine, PARoxetine, metoprolol succinate, ferrous sulfate, levothyroxine, and gabapentin.  Meds ordered this encounter  Medications  . traMADol (ULTRAM) 50 MG tablet    Sig: Take 1 tablet (50 mg total) by mouth every 6 (six) hours as needed.    Dispense:  60 tablet    Refill:  0    Problem List Items Addressed This Visit    None    Visit Diagnoses    Lower back injury, initial encounter    -  Primary    Relevant Orders    MR Lumbar Spine W Wo Contrast       Follow-up: Return in about 6 months (around 12/14/2015), or if symptoms worsen or fail to improve.  Garnet Koyanagi, DO

## 2015-08-08 ENCOUNTER — Ambulatory Visit: Payer: 59 | Admitting: Pulmonary Disease

## 2015-08-11 ENCOUNTER — Ambulatory Visit (INDEPENDENT_AMBULATORY_CARE_PROVIDER_SITE_OTHER): Payer: 59 | Admitting: Pulmonary Disease

## 2015-08-11 ENCOUNTER — Encounter: Payer: Self-pay | Admitting: Pulmonary Disease

## 2015-08-11 VITALS — BP 140/82 | HR 64 | Temp 98.7°F | Ht 64.0 in | Wt 167.4 lb

## 2015-08-11 DIAGNOSIS — G4733 Obstructive sleep apnea (adult) (pediatric): Secondary | ICD-10-CM

## 2015-08-11 DIAGNOSIS — Z9989 Dependence on other enabling machines and devices: Secondary | ICD-10-CM

## 2015-08-11 NOTE — Patient Instructions (Signed)
Can look up following company web sites for CPAP mask options: [employer], [employer], [employer], [employer]  Follow up in 6 months

## 2015-08-11 NOTE — Progress Notes (Signed)
Chief Complaint  Patient presents with  . Follow-up    pt follows for OSA. pt states using the CPAP its not really doing to good. pt using the full face mask and its been bothering her. pt currently not using CPAP everynight due to mask.  when using CPAP pt sees a difference in her sleep. DME: [facility]    History of Present Illness: Debra [name] is a 59 y.o. female with mild OSA.  She has been struggling with her CPAP mask.  She has a full face mask.  She has not tried any other mask type.  She fell and had a cut on her nose, requiring stitches.  This also contributed to mask discomfort.  She feels like her sleep and breathing are better when she can use CPAP.  TESTS: HST 03/15/15 >> AHI 5.2, SaO2 low 92%. Auto CPAP 07/10/15 to 08/08/15 >> used on 5 of 30 nights with average 2 hrs and 27 min.  Average AHI is 0.2 with median CPAP 5 cm H2O and 95 th percentile CPAP 7 cm H20.   Past medical hx >> GERD, Hypothyroidism, MCTD, Anxiety, Depression  Past surgical hx, Medications, Allergies, Family hx, Social hx all reviewed.   Physical Exam: BP 140/82 mmHg  Pulse 64  Temp(Src) 98.7 F (37.1 C) (Oral)  Ht 5\' 4"  (1.626 m)  Wt 167 lb 6.4 oz (75.932 kg)  BMI 28.72 kg/m2  SpO2 98%  General - No distress ENT - No sinus tenderness, no oral exudate, no LAN, over bite, MP 4, scalloped tongue Cardiac - s1s2 regular, no murmur Chest - No wheeze/rales/dullness Back - No focal tenderness Abd - Soft, non-tender Ext - No edema Neuro - Normal strength Skin - No rashes Psych - normal mood, and behavior   Assessment/Plan:  Obstructive sleep apnea. Her main issue with compliance is related to mask fit. Plan: - will have her DME re-assess her mask fit - if she is not able to adjust to CPAP, then might need to consider oral appliance  Insomnia Plan: - she is to follow up with behavioral therapy   Chesley Mires, MD [place] Pager:  [phone]

## 2015-08-29 ENCOUNTER — Ambulatory Visit (INDEPENDENT_AMBULATORY_CARE_PROVIDER_SITE_OTHER): Payer: 59 | Admitting: Family Medicine

## 2015-08-29 ENCOUNTER — Encounter: Payer: Self-pay | Admitting: Family Medicine

## 2015-08-29 VITALS — BP 190/120 | HR 71 | Temp 98.3°F | Wt 165.6 lb

## 2015-08-29 DIAGNOSIS — I10 Essential (primary) hypertension: Secondary | ICD-10-CM | POA: Diagnosis not present

## 2015-08-29 DIAGNOSIS — Z87898 Personal history of other specified conditions: Secondary | ICD-10-CM

## 2015-08-29 DIAGNOSIS — G8929 Other chronic pain: Secondary | ICD-10-CM | POA: Diagnosis not present

## 2015-08-29 DIAGNOSIS — Z23 Encounter for immunization: Secondary | ICD-10-CM

## 2015-08-29 DIAGNOSIS — E039 Hypothyroidism, unspecified: Secondary | ICD-10-CM

## 2015-08-29 DIAGNOSIS — Z1159 Encounter for screening for other viral diseases: Secondary | ICD-10-CM

## 2015-08-29 LAB — COMPREHENSIVE METABOLIC PANEL
ALT: 12 U/L (ref 0–35)
AST: 20 U/L (ref 0–37)
Albumin: 3.9 g/dL (ref 3.5–5.2)
Alkaline Phosphatase: 84 U/L (ref 39–117)
BILIRUBIN TOTAL: 0.7 mg/dL (ref 0.2–1.2)
BUN: 8 mg/dL (ref 6–23)
CALCIUM: 9.6 mg/dL (ref 8.4–10.5)
CO2: 30 meq/L (ref 19–32)
CREATININE: 0.72 mg/dL (ref 0.40–1.20)
Chloride: 108 mEq/L (ref 96–112)
GFR: 106.23 mL/min (ref >60.00)
GLUCOSE: 87 mg/dL (ref 70–99)
Potassium: 4.4 mEq/L (ref 3.5–5.1)
Sodium: 142 mEq/L (ref 135–145)
Total Protein: 7.6 g/dL (ref 6.0–8.3)

## 2015-08-29 LAB — THYROID PANEL WITH TSH
FREE THYROXINE INDEX: 1.1 — AB (ref 1.4–3.8)
T3 Uptake: 38 % — ABNORMAL HIGH (ref 22–35)
T4, Total: 3 ug/dL — ABNORMAL LOW (ref 4.5–12.0)
TSH: 1.512 u[IU]/mL (ref 0.350–4.500)

## 2015-08-29 LAB — POCT URINALYSIS DIPSTICK
Bilirubin, UA: NEGATIVE
Glucose, UA: NEGATIVE
Ketones, UA: NEGATIVE
Leukocytes, UA: NEGATIVE
Nitrite, UA: NEGATIVE
PH UA: 6
RBC UA: NEGATIVE
Spec Grav, UA: >=1.03
UROBILINOGEN UA: 0.2

## 2015-08-29 LAB — LIPID PANEL
CHOL/HDL RATIO: 3
Cholesterol: 181 mg/dL (ref 0–200)
HDL: 57.5 mg/dL (ref >39.00)
LDL Cholesterol: 111 mg/dL — ABNORMAL HIGH (ref 0–99)
NONHDL: 123.39
Triglycerides: 62 mg/dL (ref 0.0–149.0)
VLDL: 12.4 mg/dL (ref 0.0–40.0)

## 2015-08-29 LAB — HEPATITIS C ANTIBODY: HCV AB: NEGATIVE

## 2015-08-29 MED ORDER — TRAMADOL HCL 50 MG PO TABS: 50.0000 mg | ORAL_TABLET | Freq: Four times a day (QID) | ORAL | Status: DC | PRN

## 2015-08-29 MED ORDER — LOSARTAN POTASSIUM 50 MG PO TABS: 50.0000 mg | ORAL_TABLET | Freq: Every day | ORAL | Status: DC

## 2015-08-29 MED ORDER — METOPROLOL SUCCINATE ER 50 MG PO TB24: ORAL_TABLET | ORAL | Status: DC

## 2015-08-29 MED ORDER — GABAPENTIN 600 MG PO TABS: 600.0000 mg | ORAL_TABLET | Freq: Three times a day (TID) | ORAL | Status: DC

## 2015-08-29 NOTE — Patient Instructions (Signed)
Hypertension Hypertension, commonly called high blood pressure, is when the force of blood pumping through your arteries is too strong. Your arteries are the blood vessels that carry blood from your heart throughout your body. A blood pressure reading consists of a higher number over a lower number, such as 110/72. The higher number (systolic) is the pressure inside your arteries when your heart pumps. The lower number (diastolic) is the pressure inside your arteries when your heart relaxes. Ideally you want your blood pressure below 120/80. Hypertension forces your heart to work harder to pump blood. Your arteries may become narrow or stiff. Having untreated or uncontrolled hypertension can cause heart attack, stroke, kidney disease, and other problems. RISK FACTORS Some risk factors for high blood pressure are controllable. Others are not.  Risk factors you cannot control include:   Race. You may be at higher risk if you are African American.  Age. Risk increases with age.  Gender. Men are at higher risk than women before age 45 years. After age 65, women are at higher risk than men. Risk factors you can control include:  Not getting enough exercise or physical activity.  Being overweight.  Getting too much fat, sugar, calories, or salt in your diet.  Drinking too much alcohol. SIGNS AND SYMPTOMS Hypertension does not usually cause signs or symptoms. Extremely high blood pressure (hypertensive crisis) may cause headache, anxiety, shortness of breath, and nosebleed. DIAGNOSIS To check if you have hypertension, your health care provider will measure your blood pressure while you are seated, with your arm held at the level of your heart. It should be measured at least twice using the same arm. Certain conditions can cause a difference in blood pressure between your right and left arms. A blood pressure reading that is higher than normal on one occasion does not mean that you need treatment. If  it is not clear whether you have high blood pressure, you may be asked to return on a different day to have your blood pressure checked again. Or, you may be asked to monitor your blood pressure at home for 1 or more weeks. TREATMENT Treating high blood pressure includes making lifestyle changes and possibly taking medicine. Living a healthy lifestyle can help lower high blood pressure. You may need to change some of your habits. Lifestyle changes may include:  Following the DASH diet. This diet is high in fruits, vegetables, and whole grains. It is low in salt, red meat, and added sugars.  Keep your sodium intake below 2,300 mg per day.  Getting at least 30-45 minutes of aerobic exercise at least 4 times per week.  Losing weight if necessary.  Not smoking.  Limiting alcoholic beverages.  Learning ways to reduce stress. Your health care provider may prescribe medicine if lifestyle changes are not enough to get your blood pressure under control, and if one of the following is true:  You are 18-59 years of age and your systolic blood pressure is above 140.  You are 60 years of age or older, and your systolic blood pressure is above 150.  Your diastolic blood pressure is above 90.  You have diabetes, and your systolic blood pressure is over 140 or your diastolic blood pressure is over 90.  You have kidney disease and your blood pressure is above 140/90.  You have heart disease and your blood pressure is above 140/90. Your personal target blood pressure may vary depending on your medical conditions, your age, and other factors. HOME CARE INSTRUCTIONS    Have your blood pressure rechecked as directed by your health care provider.   Take medicines only as directed by your health care provider. Follow the directions carefully. Blood pressure medicines must be taken as prescribed. The medicine does not work as well when you skip doses. Skipping doses also puts you at risk for  problems.  Do not smoke.   Monitor your blood pressure at home as directed by your health care provider. SEEK MEDICAL CARE IF:   You think you are having a reaction to medicines taken.  You have recurrent headaches or feel dizzy.  You have swelling in your ankles.  You have trouble with your vision. SEEK IMMEDIATE MEDICAL CARE IF:  You develop a severe headache or confusion.  You have unusual weakness, numbness, or feel faint.  You have severe chest or abdominal pain.  You vomit repeatedly.  You have trouble breathing. MAKE SURE YOU:   Understand these instructions.  Will watch your condition.  Will get help right away if you are not doing well or get worse.   This information is not intended to replace advice given to you by your health care provider. Make sure you discuss any questions you have with your health care provider.   Document Released: 10/14/2005 Document Revised: 02/28/2015 Document Reviewed: 08/06/2013 Elsevier Interactive Patient Education 2016 [employer]  

## 2015-08-29 NOTE — Progress Notes (Signed)
Pre visit review using our clinic review tool, if applicable. No additional management support is needed unless otherwise documented below in the visit note. 

## 2015-08-29 NOTE — Progress Notes (Signed)
Patient ID: Debra [name], female    DOB: February 19, 1956  Age: 59 y.o. MRN: [id number]    Subjective:  Subjective HPI Debra [name] presents for f/u bp.  Was feeling nauseous yesterday.  Feeling slightly better today.  Pt had GI bug last week.    Review of Systems  Constitutional: Negative for diaphoresis, appetite change, fatigue and unexpected weight change.  Eyes: Negative for pain, redness and visual disturbance.  Respiratory: Negative for cough, chest tightness, shortness of breath and wheezing.   Cardiovascular: Negative for chest pain, palpitations and leg swelling.  Endocrine: Negative for cold intolerance, heat intolerance, polydipsia, polyphagia and polyuria.  Genitourinary: Negative for dysuria, frequency and difficulty urinating.  Neurological: Negative for dizziness, light-headedness, numbness and headaches.    History Past Medical History  Diagnosis Date  . GERD (gastroesophageal reflux disease)   . Anxiety   . Thyroid disease     Hypothyroidism  . 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 DDD (degenerative disc disease)     She has past surgical history that includes Carpal tunnel release; Leg Surgery; and Cervical spine surgery.   Her family history includes Arthritis in her mother; Breast cancer in her mother; Cancer in her mother; Colon cancer in her maternal grandfather; Diabetes in her sister; Heart disease in her father; Heart disease (age of onset: 31) in her mother; Hyperlipidemia in her sister; Hypertension in her father and sister; Stroke in her father.She reports that she has been smoking Cigarettes.  She has a 20 pack-year smoking history. She does not have any smokeless tobacco history on file. She reports that she drinks alcohol. She reports that she does not use illicit drugs.  Current Outpatient Prescriptions on File Prior to Visit  Medication Sig Dispense Refill  . levothyroxine (SYNTHROID, LEVOTHROID) 50 MCG tablet Take 1 tablet  (50 mcg total) by mouth daily before breakfast. 30 tablet 5  . Omega-3 Fatty Acids (FISH OIL PO) Take 1 tablet by mouth as needed.    Marland Kitchen PARoxetine (PAXIL) 30 MG tablet Take 1 tablet (30 mg total) by mouth daily. 90 tablet 3  . ferrous sulfate (SLOW FE) 160 (50 FE) MG TBCR SR tablet Take 1 tablet (160 mg total) by mouth daily. (Patient not taking: Reported on 08/29/2015) 90 each 3  . ranitidine (ZANTAC) 150 MG capsule TAKE ONE CAPSULE BY MOUTH TWICE DAILY (Patient not taking: Reported on 08/11/2015) 60 capsule 5   No current facility-administered medications on file prior to visit.     Objective:  Objective Physical Exam  Constitutional: She is oriented to person, place, and time. She appears well-developed and well-nourished.  HENT:  Head: Normocephalic and atraumatic.  Eyes: Conjunctivae and EOM are normal.  Neck: Normal range of motion. Neck supple. No JVD present. Carotid bruit is not present. No thyromegaly present.  Cardiovascular: Normal rate and regular rhythm.   No murmur heard. Pulmonary/Chest: Effort normal and breath sounds normal. No respiratory distress. She has no wheezes. She has no rales. She exhibits no tenderness.  Musculoskeletal: She exhibits no edema.  Neurological: She is alert and oriented to person, place, and time.  Psychiatric: She has a normal mood and affect. Her behavior is normal.   BP 190/120 mmHg  Pulse 71  Temp(Src) 98.3 F (36.8 C) (Oral)  Wt 165 lb 9.6 oz (75.116 kg)  SpO2 98% Wt Readings from Last 3 Encounters:  08/29/15 165 lb 9.6 oz (75.116 kg)  08/11/15 167 lb 6.4 oz (  75.932 kg)  06/13/15 164 lb (74.39 kg)     Lab Results  Component Value Date   WBC 6.0 02/21/2015   HGB 11.9* 02/21/2015   HCT 35.4* 02/21/2015   PLT 251.0 02/21/2015   GLUCOSE 73 02/21/2015   CHOL 180 02/21/2015   TRIG 110.0 02/21/2015   HDL 59.80 02/21/2015   LDLDIRECT 126.3 06/06/2011   LDLCALC 98 02/21/2015   ALT 17 02/21/2015   AST 23 02/21/2015   NA 139  02/21/2015   K 4.0 02/21/2015   CL 105 02/21/2015   CREATININE 0.60 02/21/2015   BUN 10 02/21/2015   CO2 31 02/21/2015   TSH 0.93 02/21/2015   HGBA1C 6.4 11/07/2014   MICROALBUR 3.1* 11/07/2014    No results found.   Assessment & Plan:  Plan I am having Ms. Haverstick start on losartan. I am also having her maintain her ranitidine, PARoxetine, ferrous sulfate, levothyroxine, Omega-3 Fatty Acids (FISH OIL PO), gabapentin, metoprolol succinate, and traMADol.  Meds ordered this encounter  Medications  . gabapentin (NEURONTIN) 600 MG tablet    Sig: Take 1 tablet (600 mg total) by mouth 3 (three) times daily.    Dispense:  90 tablet    Refill:  5  . metoprolol succinate (TOPROL-XL) 50 MG 24 hr tablet    Sig: TAKE ONE TABLET BY MOUTH TWICE DAILY    Dispense:  60 tablet    Refill:  5    D/C PREVIOUS SCRIPTS FOR THIS MEDICATION  . traMADol (ULTRAM) 50 MG tablet    Sig: Take 1 tablet (50 mg total) by mouth every 6 (six) hours as needed.    Dispense:  60 tablet    Refill:  0  . losartan (COZAAR) 50 MG tablet    Sig: Take 1 tablet (50 mg total) by mouth daily.    Dispense:  30 tablet    Refill:  2    Problem List Items Addressed This Visit    None    Visit Diagnoses    Chronic pain    -  Primary    Relevant Medications    gabapentin (NEURONTIN) 600 MG tablet    traMADol (ULTRAM) 50 MG tablet    Essential hypertension        Relevant Medications    metoprolol succinate (TOPROL-XL) 50 MG 24 hr tablet    losartan (COZAAR) 50 MG tablet    Other Relevant Orders    Comp Met (CMET)    Lipid panel    POCT urinalysis dipstick    Need for immunization against influenza        Relevant Orders    Flu Vaccine QUAD 36+ mos IM (Fluarix) (Completed)    Hypothyroidism, unspecified hypothyroidism type        Relevant Medications    metoprolol succinate (TOPROL-XL) 50 MG 24 hr tablet    Other Relevant Orders    Thyroid Panel With TSH    Need for hepatitis C screening test        Relevant  Orders    Hepatitis C antibody    History of itching           Follow-up: Return in about 2 weeks (around 09/12/2015), or if symptoms worsen or fail to improve, for bp check.  Garnet Koyanagi, DO     ++

## 2015-09-01 ENCOUNTER — Other Ambulatory Visit: Payer: Self-pay

## 2015-09-01 DIAGNOSIS — R7989 Other specified abnormal findings of blood chemistry: Secondary | ICD-10-CM

## 2015-09-01 MED ORDER — LEVOTHYROXINE SODIUM 75 MCG PO TABS: 75.0000 ug | ORAL_TABLET | Freq: Every day | ORAL | Status: DC

## 2015-09-12 ENCOUNTER — Encounter: Payer: Self-pay | Admitting: Family Medicine

## 2015-09-12 ENCOUNTER — Ambulatory Visit (INDEPENDENT_AMBULATORY_CARE_PROVIDER_SITE_OTHER): Payer: 59 | Admitting: Family Medicine

## 2015-09-12 VITALS — BP 144/88 | HR 73 | Temp 98.2°F | Wt 166.4 lb

## 2015-09-12 DIAGNOSIS — I10 Essential (primary) hypertension: Secondary | ICD-10-CM

## 2015-09-12 DIAGNOSIS — D509 Iron deficiency anemia, unspecified: Secondary | ICD-10-CM | POA: Diagnosis not present

## 2015-09-12 LAB — CBC WITH DIFFERENTIAL/PLATELET
Basophils Absolute: 0 10*3/uL (ref 0.0–0.1)
Basophils Relative: 0.1 % (ref 0.0–3.0)
EOS ABS: 0.1 10*3/uL (ref 0.0–0.7)
Eosinophils Relative: 2.9 % (ref 0.0–5.0)
HCT: 36.4 % (ref 36.0–46.0)
Hemoglobin: 11.9 g/dL — ABNORMAL LOW (ref 12.0–15.0)
LYMPHS ABS: 1.6 10*3/uL (ref 0.7–4.0)
LYMPHS PCT: 32.3 % (ref 12.0–46.0)
MCHC: 32.8 g/dL (ref 30.0–36.0)
MCV: 83.4 fl (ref 78.0–100.0)
Monocytes Absolute: 0.5 10*3/uL (ref 0.1–1.0)
Monocytes Relative: 10.8 % (ref 3.0–12.0)
NEUTROS ABS: 2.6 10*3/uL (ref 1.4–7.7)
NEUTROS PCT: 53.9 % (ref 43.0–77.0)
PLATELETS: 245 10*3/uL (ref 150.0–400.0)
RBC: 4.36 Mil/uL (ref 3.87–5.11)
RDW: 14.1 % (ref 11.5–15.5)
WBC: 4.8 10*3/uL (ref 4.0–10.5)

## 2015-09-12 MED ORDER — LOSARTAN POTASSIUM 100 MG PO TABS: 100.0000 mg | ORAL_TABLET | Freq: Every day | ORAL | Status: DC

## 2015-09-12 NOTE — Patient Instructions (Signed)
Hypertension Hypertension, commonly called high blood pressure, is when the force of blood pumping through your arteries is too strong. Your arteries are the blood vessels that carry blood from your heart throughout your body. A blood pressure reading consists of a higher number over a lower number, such as 110/72. The higher number (systolic) is the pressure inside your arteries when your heart pumps. The lower number (diastolic) is the pressure inside your arteries when your heart relaxes. Ideally you want your blood pressure below 120/80. Hypertension forces your heart to work harder to pump blood. Your arteries may become narrow or stiff. Having untreated or uncontrolled hypertension can cause heart attack, stroke, kidney disease, and other problems. RISK FACTORS Some risk factors for high blood pressure are controllable. Others are not.  Risk factors you cannot control include:   Race. You may be at higher risk if you are African American.  Age. Risk increases with age.  Gender. Men are at higher risk than women before age 45 years. After age 65, women are at higher risk than men. Risk factors you can control include:  Not getting enough exercise or physical activity.  Being overweight.  Getting too much fat, sugar, calories, or salt in your diet.  Drinking too much alcohol. SIGNS AND SYMPTOMS Hypertension does not usually cause signs or symptoms. Extremely high blood pressure (hypertensive crisis) may cause headache, anxiety, shortness of breath, and nosebleed. DIAGNOSIS To check if you have hypertension, your health care provider will measure your blood pressure while you are seated, with your arm held at the level of your heart. It should be measured at least twice using the same arm. Certain conditions can cause a difference in blood pressure between your right and left arms. A blood pressure reading that is higher than normal on one occasion does not mean that you need treatment. If  it is not clear whether you have high blood pressure, you may be asked to return on a different day to have your blood pressure checked again. Or, you may be asked to monitor your blood pressure at home for 1 or more weeks. TREATMENT Treating high blood pressure includes making lifestyle changes and possibly taking medicine. Living a healthy lifestyle can help lower high blood pressure. You may need to change some of your habits. Lifestyle changes may include:  Following the DASH diet. This diet is high in fruits, vegetables, and whole grains. It is low in salt, red meat, and added sugars.  Keep your sodium intake below 2,300 mg per day.  Getting at least 30-45 minutes of aerobic exercise at least 4 times per week.  Losing weight if necessary.  Not smoking.  Limiting alcoholic beverages.  Learning ways to reduce stress. Your health care provider may prescribe medicine if lifestyle changes are not enough to get your blood pressure under control, and if one of the following is true:  You are 18-59 years of age and your systolic blood pressure is above 140.  You are 60 years of age or older, and your systolic blood pressure is above 150.  Your diastolic blood pressure is above 90.  You have diabetes, and your systolic blood pressure is over 140 or your diastolic blood pressure is over 90.  You have kidney disease and your blood pressure is above 140/90.  You have heart disease and your blood pressure is above 140/90. Your personal target blood pressure may vary depending on your medical conditions, your age, and other factors. HOME CARE INSTRUCTIONS    Have your blood pressure rechecked as directed by your health care provider.   Take medicines only as directed by your health care provider. Follow the directions carefully. Blood pressure medicines must be taken as prescribed. The medicine does not work as well when you skip doses. Skipping doses also puts you at risk for  problems.  Do not smoke.   Monitor your blood pressure at home as directed by your health care provider. SEEK MEDICAL CARE IF:   You think you are having a reaction to medicines taken.  You have recurrent headaches or feel dizzy.  You have swelling in your ankles.  You have trouble with your vision. SEEK IMMEDIATE MEDICAL CARE IF:  You develop a severe headache or confusion.  You have unusual weakness, numbness, or feel faint.  You have severe chest or abdominal pain.  You vomit repeatedly.  You have trouble breathing. MAKE SURE YOU:   Understand these instructions.  Will watch your condition.  Will get help right away if you are not doing well or get worse.   This information is not intended to replace advice given to you by your health care provider. Make sure you discuss any questions you have with your health care provider.   Document Released: 10/14/2005 Document Revised: 02/28/2015 Document Reviewed: 08/06/2013 Elsevier Interactive Patient Education 2016 [employer]  

## 2015-09-12 NOTE — Progress Notes (Signed)
Pre visit review using our clinic review tool, if applicable. No additional management support is needed unless otherwise documented below in the visit note. 

## 2015-09-12 NOTE — Assessment & Plan Note (Signed)
Inc cozaar 100 mg 1 po qd

## 2015-09-12 NOTE — Progress Notes (Signed)
Patient ID: Debra [name], female    DOB: 1956/02/20  Age: 59 y.o. MRN: [id number]    Subjective:  Subjective HPI Debra [name] presents for bp f/u and c/o neck pain.  She is saving money for mri so she can go back to Dr Joya Salm.  Pt also states she needs to save to see psych again.    Review of Systems  Constitutional: Negative for diaphoresis, appetite change, fatigue and unexpected weight change.  Eyes: Negative for pain, redness and visual disturbance.  Respiratory: Negative for cough, chest tightness, shortness of breath and wheezing.   Cardiovascular: Negative for chest pain, palpitations and leg swelling.  Endocrine: Negative for cold intolerance, heat intolerance, polydipsia, polyphagia and polyuria.  Genitourinary: Negative for dysuria, frequency and difficulty urinating.  Musculoskeletal: Positive for neck pain and neck stiffness. Negative for back pain.  Neurological: Negative for dizziness, light-headedness, numbness and headaches.    History Past Medical History  Diagnosis Date  . GERD (gastroesophageal reflux disease)   . Anxiety   . Thyroid disease     Hypothyroidism  . 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 DDD (degenerative disc disease)     She has past surgical history that includes Carpal tunnel release; Leg Surgery; and Cervical spine surgery.   Her family history includes Arthritis in her mother; Breast cancer in her mother; Cancer in her mother; Colon cancer in her maternal grandfather; Diabetes in her sister; Heart disease in her father; Heart disease (age of onset: 56) in her mother; Hyperlipidemia in her sister; Hypertension in her father and sister; Stroke in her father.She reports that she has been smoking Cigarettes.  She has a 20 pack-year smoking history. She does not have any smokeless tobacco history on file. She reports that she drinks alcohol. She reports that she does not use illicit drugs.  Current Outpatient Prescriptions  on File Prior to Visit  Medication Sig Dispense Refill  . ferrous sulfate (SLOW FE) 160 (50 FE) MG TBCR SR tablet Take 1 tablet (160 mg total) by mouth daily. 90 each 3  . gabapentin (NEURONTIN) 600 MG tablet Take 1 tablet (600 mg total) by mouth 3 (three) times daily. 90 tablet 5  . levothyroxine (SYNTHROID, LEVOTHROID) 75 MCG tablet Take 1 tablet (75 mcg total) by mouth daily before breakfast. 30 tablet 0  . metoprolol succinate (TOPROL-XL) 50 MG 24 hr tablet TAKE ONE TABLET BY MOUTH TWICE DAILY 60 tablet 5  . Omega-3 Fatty Acids (FISH OIL PO) Take 1 tablet by mouth as needed.    Marland Kitchen PARoxetine (PAXIL) 30 MG tablet Take 1 tablet (30 mg total) by mouth daily. 90 tablet 3  . traMADol (ULTRAM) 50 MG tablet Take 1 tablet (50 mg total) by mouth every 6 (six) hours as needed. 60 tablet 0   No current facility-administered medications on file prior to visit.     Objective:  Objective Physical Exam  Constitutional: She is oriented to person, place, and time. She appears well-developed and well-nourished.  HENT:  Head: Normocephalic and atraumatic.  Eyes: Conjunctivae and EOM are normal.  Neck: Normal range of motion. Neck supple. No JVD present. Carotid bruit is not present. No thyromegaly present.  Cardiovascular: Normal rate, regular rhythm and normal heart sounds.   No murmur heard. Pulmonary/Chest: Effort normal and breath sounds normal. No respiratory distress. She has no wheezes. She has no rales. She exhibits no tenderness.  Musculoskeletal: She exhibits tenderness. She exhibits no edema.  Neurological: She is alert and oriented to person, place, and time.  Psychiatric: She has a normal mood and affect. Her behavior is normal.  Nursing note and vitals reviewed.  BP 144/88 mmHg  Pulse 73  Temp(Src) 98.2 F (36.8 C) (Oral)  Wt 166 lb 6.4 oz (75.479 kg)  SpO2 98% Wt Readings from Last 3 Encounters:  09/12/15 166 lb 6.4 oz (75.479 kg)  08/29/15 165 lb 9.6 oz (75.116 kg)  08/11/15 167  lb 6.4 oz (75.932 kg)     Lab Results  Component Value Date   WBC 4.8 09/12/2015   HGB 11.9* 09/12/2015   HCT 36.4 09/12/2015   PLT 245.0 09/12/2015   GLUCOSE 87 08/29/2015   CHOL 181 08/29/2015   TRIG 62.0 08/29/2015   HDL 57.50 08/29/2015   LDLDIRECT 126.3 06/06/2011   LDLCALC 111* 08/29/2015   ALT 12 08/29/2015   AST 20 08/29/2015   NA 142 08/29/2015   K 4.4 08/29/2015   CL 108 08/29/2015   CREATININE 0.72 08/29/2015   BUN 8 08/29/2015   CO2 30 08/29/2015   TSH 1.512 08/29/2015   HGBA1C 6.4 11/07/2014   MICROALBUR 3.1* 11/07/2014    No results found.   Assessment & Plan:  Plan I have discontinued Ms. Glogowski's ranitidine and losartan. I am also having her start on losartan. Additionally, I am having her maintain her PARoxetine, ferrous sulfate, Omega-3 Fatty Acids (FISH OIL PO), gabapentin, metoprolol succinate, traMADol, and levothyroxine.  Meds ordered this encounter  Medications  . losartan (COZAAR) 100 MG tablet    Sig: Take 1 tablet (100 mg total) by mouth daily.    Dispense:  90 tablet    Refill:  3    Problem List Items Addressed This Visit    None    Visit Diagnoses    Essential hypertension    -  Primary    Relevant Medications    losartan (COZAAR) 100 MG tablet    Iron deficiency anemia        Relevant Orders    CBC with Differential/Platelet (Completed)       Follow-up: Return in about 6 months (around 03/11/2016), or if symptoms worsen or fail to improve, for hypertension, annual exam, fasting.  Garnet Koyanagi, DO

## 2015-10-02 HISTORY — PX: WRIST FRACTURE SURGERY: SHX121

## 2015-10-31 ENCOUNTER — Other Ambulatory Visit: Payer: Self-pay | Admitting: Family Medicine

## 2015-10-31 DIAGNOSIS — R7989 Other specified abnormal findings of blood chemistry: Secondary | ICD-10-CM

## 2015-11-01 ENCOUNTER — Other Ambulatory Visit (INDEPENDENT_AMBULATORY_CARE_PROVIDER_SITE_OTHER): Payer: Self-pay

## 2015-11-01 DIAGNOSIS — R7989 Other specified abnormal findings of blood chemistry: Secondary | ICD-10-CM

## 2015-11-01 DIAGNOSIS — R946 Abnormal results of thyroid function studies: Secondary | ICD-10-CM

## 2015-11-01 LAB — THYROID PANEL WITH TSH
Free Thyroxine Index: 1.2 — ABNORMAL LOW (ref 1.4–3.8)
T3 Uptake: 40 % — ABNORMAL HIGH (ref 22–35)
T4 TOTAL: 3.1 ug/dL — AB (ref 4.5–12.0)
TSH: 0.204 u[IU]/mL — ABNORMAL LOW (ref 0.350–4.500)

## 2015-11-02 ENCOUNTER — Telehealth: Payer: Self-pay | Admitting: Family Medicine

## 2015-11-02 DIAGNOSIS — F411 Generalized anxiety disorder: Secondary | ICD-10-CM

## 2015-11-02 MED ORDER — PAROXETINE HCL 30 MG PO TABS: 30.0000 mg | ORAL_TABLET | Freq: Every day | ORAL | Status: DC

## 2015-11-02 MED ORDER — LEVOTHYROXINE SODIUM 50 MCG PO TABS: 50.0000 ug | ORAL_TABLET | Freq: Every day | ORAL | Status: DC

## 2015-11-02 NOTE — Addendum Note (Signed)
Addended by: Ewing Schlein on: 11/02/2015 03:06 PM   Modules accepted: Orders

## 2015-11-02 NOTE — Telephone Encounter (Signed)
Please review labs that was done yesterday and advise, the patient is out of med's and has been out for 4 days      KP

## 2015-11-02 NOTE — Addendum Note (Signed)
Addended by: Ewing Schlein on: 11/02/2015 10:57 AM   Modules accepted: Orders

## 2015-11-02 NOTE — Telephone Encounter (Signed)
Notes Recorded by Rosalita Chessman, DO on 11/02/2015 at 10:45 AM Lower synthroid to 50 mcg and refer to endo  Discussed with patient and she verbalized understanding. Rx faxed.     KP

## 2015-11-02 NOTE — Telephone Encounter (Signed)
Caller name: Arieon  Relationship to patient:   Can be reached: [phone]  Reason for call: Pt called in to check the status of her Rx refill on her levothyroxine. ALSO, pt would like to know the results of her labs.    Please assist further.

## 2015-12-04 ENCOUNTER — Telehealth: Payer: Self-pay | Admitting: Family Medicine

## 2015-12-04 NOTE — Telephone Encounter (Addendum)
C/o:  Feels like heart is racing in chest.  Feels tired.  She's stressed out (did not provide details).  Had surgery on wrist last Friday.  Denies chest pain or dizziness.  States mild short of breath some before, but not today.  States drinking plenty of fluids.  Eating ok.  States taking all medications as prescribed.  States taking losartan and paxil and does not know if that's the cause or not.  No drug drug interaction noted via interaction checker.    Hx.  Anxiety, palpitations, hypothyroidism and atypical chest pain.    Advise:  Office is closing.  Pt advised to go to ER.  Pt states she does not have anyone to drive her.  Advised to call EMS.  Pt states that she would try to rest and see how she feels and then go.  Pt was strongly advised to go to ER or call EMS.  Pt declined.    Will follow up with patient.

## 2015-12-04 NOTE — Telephone Encounter (Signed)
Please check on pt in am

## 2015-12-04 NOTE — Telephone Encounter (Signed)
Pt called in stating that the last few days she is not feeling well and her heart is racing. She states taking percocet and BP meds and is wondering if they are making her feel this way. Transferred to [employer].

## 2015-12-05 NOTE — Telephone Encounter (Addendum)
Called to follow up with patient.  Pt states she feels a little better today.  States she ate soup last night and rested some and that helped.  She still feels tired and only feels heart racing whenever she lays on left side.  Pt sounds lethargic and speech slightly slurred over the phone.  Pt states she's is taking Percocet for pain.  States taking 1-2 tablets once or twice a day depending on pain.  Says medication is somewhat effective at treating pain, but she does not like taking medication because it hurts her stomach.  Pt was encouraged to make sure she eats and drink plenty of fluids.    Pt was again encouraged to be seen.  Offered appts for today.  Pt declined.  States she could come in on Thursday.  Appt scheduled for Thursday at 11:30 am with Dr. Etter Sjogren.  Pt was encouraged if symptoms worsen or new symptoms develop to call EMS.  Pt stated understanding agreed.  Message routed to PCP for FYI.

## 2015-12-05 NOTE — Telephone Encounter (Signed)
noted 

## 2015-12-06 NOTE — Telephone Encounter (Signed)
Pt called in stating she had hand surgery recently and called that doctor. They changed her meds and she is feeling better. Gave her med for nausea. She does not want to keep appt for 12/07/15.

## 2015-12-07 ENCOUNTER — Ambulatory Visit: Payer: Self-pay | Admitting: Family Medicine

## 2015-12-07 NOTE — Telephone Encounter (Signed)
noted 

## 2015-12-13 ENCOUNTER — Encounter: Payer: Self-pay | Admitting: Endocrinology

## 2015-12-13 ENCOUNTER — Ambulatory Visit (INDEPENDENT_AMBULATORY_CARE_PROVIDER_SITE_OTHER): Payer: BLUE CROSS/BLUE SHIELD | Admitting: Endocrinology

## 2015-12-13 VITALS — BP 124/78 | HR 71 | Temp 98.7°F | Resp 14 | Ht 64.0 in | Wt 165.0 lb

## 2015-12-13 DIAGNOSIS — R5382 Chronic fatigue, unspecified: Secondary | ICD-10-CM

## 2015-12-13 DIAGNOSIS — N951 Menopausal and female climacteric states: Secondary | ICD-10-CM

## 2015-12-13 DIAGNOSIS — E038 Other specified hypothyroidism: Secondary | ICD-10-CM

## 2015-12-13 LAB — TSH: TSH: 0.69 u[IU]/mL (ref 0.35–4.50)

## 2015-12-13 LAB — FOLLICLE STIMULATING HORMONE: FSH: 51.7 m[IU]/mL

## 2015-12-13 LAB — T4, FREE: Free T4: 1.06 ng/dL (ref 0.60–1.60)

## 2015-12-13 NOTE — Progress Notes (Signed)
Patient ID: Debra [name], female   DOB: 1956-02-13, 60 y.o.   MRN: [id number]            Reason for Appointment:  Hypothyroidism, new visit    History of Present Illness:   She was first started on thyroid supplements about 20 years ago At that time she had surgery on her left thyroid lobe and she does not know why but was told that she had a benign outcome  She does not remember whether she had any hypothyroid symptoms when starting the medication Not clear if she was diagnosed to have hypothyroidism at any point  She has been treated with mostly 75 g of levothyroxine since then.  Review of her labs indicate that her TSH level has always been normal as far as the records are available, has had a slightly low TSH on 2 occasions but never high Free T4 level was normal in 11/15 but her free T3 was slightly lower at that time along with TSH of 0.3 No other free T4 levels are available  She said that for several years she has had symptoms of fatigue and cold intolerance, difficulty concentrating and difficulty losing weight. In the last few months she has been having some hair loss.  In 1/17 when her TSH level was relatively low she was told to reduce her dose to 50 g She thinks she is feeling more tired since then         Patient's weight history is as follows:  Wt Readings from Last 3 Encounters:  12/13/15 165 lb (74.844 kg)  09/12/15 166 lb 6.4 oz (75.479 kg)  08/29/15 165 lb 9.6 oz (75.116 kg)    Thyroid function results have been as follows:  Lab Results  Component Value Date   FREET4 1.00 09/20/2014   TSH 0.204* 11/01/2015   TSH 1.512 08/29/2015   TSH 0.93 02/21/2015     Past Medical History  Diagnosis Date  . GERD (gastroesophageal reflux disease)   . Anxiety   . Thyroid disease     Hypothyroidism  . 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 DDD (degenerative disc disease)     Past Surgical History  Procedure Laterality Date  .  Carpal tunnel release      Bilateral  . Leg surgery      Right   . Cervical spine surgery      x3    Family History  Problem Relation Age of Onset  . Colon cancer Maternal Grandfather   . Breast cancer Mother   . Arthritis Mother     rheumatoid  . Cancer Mother     breast  . Heart disease Mother 44    MI  . Heart disease Father     MI  . Hypertension Father   . Stroke Father   . Diabetes Sister   . Hypertension Sister   . Hyperlipidemia Sister     Social History:  reports that she has been smoking Cigarettes.  She has a 20 pack-year smoking history. She does not have any smokeless tobacco history on file. She reports that she drinks alcohol. She reports that she does not use illicit drugs.  Allergies:  Allergies  Allergen Reactions  . Penicillins   . Codeine Rash and Other (See Comments)    dizziness      Medication List       This list is accurate as of: 12/13/15  1:00 PM.  Always  use your most recent med list.               ferrous sulfate 160 (50 Fe) MG Tbcr SR tablet  Commonly known as:  SLOW FE  Take 1 tablet (160 mg total) by mouth daily.     FISH OIL PO  Take 1 tablet by mouth as needed. Reported on 12/13/2015     gabapentin 600 MG tablet  Commonly known as:  NEURONTIN  Take 1 tablet (600 mg total) by mouth 3 (three) times daily.     levothyroxine 50 MCG tablet  Commonly known as:  SYNTHROID, LEVOTHROID  Take 1 tablet (50 mcg total) by mouth daily.     losartan 100 MG tablet  Commonly known as:  COZAAR  Take 1 tablet (100 mg total) by mouth daily.     metoprolol succinate 50 MG 24 hr tablet  Commonly known as:  TOPROL-XL  TAKE ONE TABLET BY MOUTH TWICE DAILY     oxyCODONE-acetaminophen 7.5-325 MG tablet  Commonly known as:  PERCOCET  Take 1 tablet by mouth every 4 (four) hours as needed for severe pain.     PARoxetine 30 MG tablet  Commonly known as:  PAXIL  Take 1 tablet (30 mg total) by mouth daily.        Review of  Systems:  Review of Systems  Constitutional: Positive for weight gain.  HENT: Negative for trouble swallowing.   Eyes: Negative for blurred vision.  Respiratory: Negative for shortness of breath.   Gastrointestinal: Positive for constipation.  Endocrine: Positive for cold intolerance.       Menopause age ?Marland Kitchen  She does not know whether it was age 60 or 7.  She did have significant hot flashes for some time, minimal now  Musculoskeletal: Positive for joint pain.       She has had Raynaud's phenomena with fingers turning white in cold  Psychiatric/Behavioral: Positive for depressed mood and nervousness.       On treatment with Paxil off and on, 30 years.  She thinks this is for anxiety.  Does get depressed at times because of her social situation                Examination:    BP 124/78 mmHg  Pulse 71  Temp(Src) 98.7 F (37.1 C)  Resp 14  Ht 5\' 4"  (1.626 m)  Wt 165 lb (74.844 kg)  BMI 28.31 kg/m2  SpO2 98%  GENERAL:  Average build.  She has a relatively flat affect.  Has no puffiness of the face  No pallor, clubbing, lymphadenopathy or edema.  Skin:  no rash or pigmentation.  EYES:  No prominence of the eyes  ENT: Oral mucosa and tongue normal.  Voice is slightly hoarse  THYROID:  Not palpable.  HEART:  Normal  S1 and S2; no murmur or click.  CHEST:    Lungs: Vescicular breath sounds heard equally.  No crepitations/ wheeze.  ABDOMEN:  No distention.  Liver and spleen not palpable.  No other mass or tenderness.  NEUROLOGICAL: Reflexes are bilaterally normal at biceps and ankles.  JOINTS:  Normal.   Assessment:  HYPOTHYROIDISM, likely to be secondary with her T4 level being low and also a history of low free T3 level associated with normal to low TSH level Although she has nonspecific symptoms she does have significant fatigue, difficulty concentrating and cold intolerance suggestive of hypothyroidism; she does have some depression also and is on treatment Unless  she has  had Hashimoto's thyroiditis she is unlikely to be having primary hypothyroidism because of having a hemithyroidectomy  History of anxiety and depression, not completely controlled  PLAN:   Since her thyroid hormone dose was reduced last month will recheck her labs today including free T4 level Also check [place] level to rule out hypopituitarism; if this is inappropriately low may also do IGF-1 level  Most likely will need to treat her with adequate levothyroxine dose to get her free T4 back to normal regardless of day at this level This was explained to the patient  Follow-up in 6 weeks   [place] Ke 12/13/2015, 1:00 PM

## 2015-12-14 NOTE — Progress Notes (Signed)
Quick Note:  Please let patient know that all the lab results are normal including test for the pituitary hormone function and to continue same thyroid dose. Need to discuss her symptoms with PCP ______

## 2016-01-08 ENCOUNTER — Telehealth: Payer: Self-pay | Admitting: Pulmonary Disease

## 2016-01-08 NOTE — Telephone Encounter (Signed)
Patient states that she has not been able to wear CPAP machine. Patient had her wrist broken.  She has been out of work for months.  She was only able to use 1 hand, and worried she would not be able to clean it well enough.  Her sister obtained an infection because she was not able to clean hers well.  She said that she received a new hose for the CPAP machine, so she said that she is going to try to start using the machine again next week.  But she is worried about using it because the machine has been exposed to dust and she is worried that it will get into her lungs.  She wants to know how to clean the debris from her machine.   Called [facility] and spoke with Jeani Hawking.  She said that patient can clean machine as follows: Use damp rag, no soap, just wipe out with damp rag, connect hose, mask, etc.. Let machine run for 1 hour to blow out debris.  Advised patient that she should keep her appointment with Dr. Halford Chessman and just start back on her CPAP.  Patient says that she will clean it out and try to start back on it again either tonight or tomorrow night. Nothing further needed.

## 2016-02-02 ENCOUNTER — Other Ambulatory Visit: Payer: Self-pay | Admitting: Family Medicine

## 2016-02-07 ENCOUNTER — Ambulatory Visit: Payer: Self-pay | Admitting: Pulmonary Disease

## 2016-03-05 ENCOUNTER — Encounter: Payer: Self-pay | Admitting: Gastroenterology

## 2016-03-12 ENCOUNTER — Ambulatory Visit (INDEPENDENT_AMBULATORY_CARE_PROVIDER_SITE_OTHER): Payer: BLUE CROSS/BLUE SHIELD | Admitting: Family Medicine

## 2016-03-12 ENCOUNTER — Encounter: Payer: Self-pay | Admitting: Family Medicine

## 2016-03-12 VITALS — BP 140/100 | HR 82 | Temp 98.2°F | Ht 64.0 in | Wt 164.4 lb

## 2016-03-12 DIAGNOSIS — Z0001 Encounter for general adult medical examination with abnormal findings: Secondary | ICD-10-CM

## 2016-03-12 DIAGNOSIS — R8299 Other abnormal findings in urine: Secondary | ICD-10-CM | POA: Diagnosis not present

## 2016-03-12 DIAGNOSIS — R82998 Other abnormal findings in urine: Secondary | ICD-10-CM

## 2016-03-12 DIAGNOSIS — Z0000 Encounter for general adult medical examination without abnormal findings: Secondary | ICD-10-CM

## 2016-03-12 DIAGNOSIS — I10 Essential (primary) hypertension: Secondary | ICD-10-CM

## 2016-03-12 DIAGNOSIS — E2839 Other primary ovarian failure: Secondary | ICD-10-CM

## 2016-03-12 DIAGNOSIS — Z23 Encounter for immunization: Secondary | ICD-10-CM | POA: Diagnosis not present

## 2016-03-12 DIAGNOSIS — H538 Other visual disturbances: Secondary | ICD-10-CM

## 2016-03-12 DIAGNOSIS — R5382 Chronic fatigue, unspecified: Secondary | ICD-10-CM

## 2016-03-12 DIAGNOSIS — R739 Hyperglycemia, unspecified: Secondary | ICD-10-CM | POA: Diagnosis not present

## 2016-03-12 DIAGNOSIS — Z1239 Encounter for other screening for malignant neoplasm of breast: Secondary | ICD-10-CM

## 2016-03-12 LAB — POCT URINALYSIS DIPSTICK
BILIRUBIN UA: NEGATIVE
Glucose, UA: NEGATIVE
KETONES UA: NEGATIVE
Nitrite, UA: NEGATIVE
PH UA: 6
PROTEIN UA: NEGATIVE
RBC UA: NEGATIVE
Urobilinogen, UA: 0.2

## 2016-03-12 MED ORDER — LOSARTAN POTASSIUM 100 MG PO TABS: 100.0000 mg | ORAL_TABLET | Freq: Every day | ORAL | Status: DC

## 2016-03-12 MED ORDER — METOPROLOL SUCCINATE ER 50 MG PO TB24: ORAL_TABLET | ORAL | Status: DC

## 2016-03-12 NOTE — Patient Instructions (Signed)
Preventive Care for Adults, Female A healthy lifestyle and preventive care can promote health and wellness. Preventive health guidelines for women include the following key practices.  A routine yearly physical is a good way to check with your health care provider about your health and preventive screening. It is a chance to share any concerns and updates on your health and to receive a thorough exam.  Visit your dentist for a routine exam and preventive care every 6 months. Brush your teeth twice a day and floss once a day. Good oral hygiene prevents tooth decay and gum disease.  The frequency of eye exams is based on your age, health, family medical history, use of contact lenses, and other factors. Follow your health care provider's recommendations for frequency of eye exams.  Eat a healthy diet. Foods like vegetables, fruits, whole grains, low-fat dairy products, and lean protein foods contain the nutrients you need without too many calories. Decrease your intake of foods high in solid fats, added sugars, and salt. Eat the right amount of calories for you.Get information about a proper diet from your health care provider, if necessary.  Regular physical exercise is one of the most important things you can do for your health. Most adults should get at least 150 minutes of moderate-intensity exercise (any activity that increases your heart rate and causes you to sweat) each week. In addition, most adults need muscle-strengthening exercises on 2 or more days a week.  Maintain a healthy weight. The body mass index (BMI) is a screening tool to identify possible weight problems. It provides an estimate of body fat based on height and weight. Your health care provider can find your BMI and can help you achieve or maintain a healthy weight.For adults 20 years and older:  A BMI below 18.5 is considered underweight.  A BMI of 18.5 to 24.9 is normal.  A BMI of 25 to 29.9 is considered overweight.  A  BMI of 30 and above is considered obese.  Maintain normal blood lipids and cholesterol levels by exercising and minimizing your intake of saturated fat. Eat a balanced diet with plenty of fruit and vegetables. Blood tests for lipids and cholesterol should begin at age 45 and be repeated every 5 years. If your lipid or cholesterol levels are high, you are over 50, or you are at high risk for heart disease, you may need your cholesterol levels checked more frequently.Ongoing high lipid and cholesterol levels should be treated with medicines if diet and exercise are not working.  If you smoke, find out from your health care provider how to quit. If you do not use tobacco, do not start.  Lung cancer screening is recommended for adults aged 45-80 years who are at high risk for developing lung cancer because of a history of smoking. A yearly low-dose CT scan of the lungs is recommended for people who have at least a 30-pack-year history of smoking and are a current smoker or have quit within the past 15 years. A pack year of smoking is smoking an average of 1 pack of cigarettes a day for 1 year (for example: 1 pack a day for 30 years or 2 packs a day for 15 years). Yearly screening should continue until the smoker has stopped smoking for at least 15 years. Yearly screening should be stopped for people who develop a health problem that would prevent them from having lung cancer treatment.  If you are pregnant, do not drink alcohol. If you are  breastfeeding, be very cautious about drinking alcohol. If you are not pregnant and choose to drink alcohol, do not have more than 1 drink per day. One drink is considered to be 12 ounces (355 mL) of beer, 5 ounces (148 mL) of wine, or 1.5 ounces (44 mL) of liquor.  Avoid use of street drugs. Do not share needles with anyone. Ask for help if you need support or instructions about stopping the use of drugs.  High blood pressure causes heart disease and increases the risk  of stroke. Your blood pressure should be checked at least every 1 to 2 years. Ongoing high blood pressure should be treated with medicines if weight loss and exercise do not work.  If you are 98-65 years old, ask your health care provider if you should take aspirin to prevent strokes.  Diabetes screening is done by taking a blood sample to check your blood glucose level after you have not eaten for a certain period of time (fasting). If you are not overweight and you do not have risk factors for diabetes, you should be screened once every 3 years starting at age 28. If you are overweight or obese and you are 80-26 years of age, you should be screened for diabetes every year as part of your cardiovascular risk assessment.  Breast cancer screening is essential preventive care for women. You should practice "breast self-awareness." This means understanding the normal appearance and feel of your breasts and may include breast self-examination. Any changes detected, no matter how small, should be reported to a health care provider. Women in their 55s and 30s should have a clinical breast exam (CBE) by a health care provider as part of a regular health exam every 1 to 3 years. After age 59, women should have a CBE every year. Starting at age 65, women should consider having a mammogram (breast X-ray test) every year. Women who have a family history of breast cancer should talk to their health care provider about genetic screening. Women at a high risk of breast cancer should talk to their health care providers about having an MRI and a mammogram every year.  Breast cancer gene (BRCA)-related cancer risk assessment is recommended for women who have family members with BRCA-related cancers. BRCA-related cancers include breast, ovarian, tubal, and peritoneal cancers. Having family members with these cancers may be associated with an increased risk for harmful changes (mutations) in the breast cancer genes BRCA1 and  BRCA2. Results of the assessment will determine the need for genetic counseling and BRCA1 and BRCA2 testing.  Your health care provider may recommend that you be screened regularly for cancer of the pelvic organs (ovaries, uterus, and vagina). This screening involves a pelvic examination, including checking for microscopic changes to the surface of your cervix (Pap test). You may be encouraged to have this screening done every 3 years, beginning at age 25.  For women ages 75-65, health care providers may recommend pelvic exams and Pap testing every 3 years, or they may recommend the Pap and pelvic exam, combined with testing for human papilloma virus (HPV), every 5 years. Some types of HPV increase your risk of cervical cancer. Testing for HPV may also be done on women of any age with unclear Pap test results.  Other health care providers may not recommend any screening for nonpregnant women who are considered low risk for pelvic cancer and who do not have symptoms. Ask your health care provider if a screening pelvic exam is right for  you.  If you have had past treatment for cervical cancer or a condition that could lead to cancer, you need Pap tests and screening for cancer for at least 20 years after your treatment. If Pap tests have been discontinued, your risk factors (such as having a new sexual partner) need to be reassessed to determine if screening should resume. Some women have medical problems that increase the chance of getting cervical cancer. In these cases, your health care provider may recommend more frequent screening and Pap tests.  Colorectal cancer can be detected and often prevented. Most routine colorectal cancer screening begins at the age of 50 years and continues through age 75 years. However, your health care provider may recommend screening at an earlier age if you have risk factors for colon cancer. On a yearly basis, your health care provider may provide home test kits to check  for hidden blood in the stool. Use of a small camera at the end of a tube, to directly examine the colon (sigmoidoscopy or colonoscopy), can detect the earliest forms of colorectal cancer. Talk to your health care provider about this at age 50, when routine screening begins. Direct exam of the colon should be repeated every 5-10 years through age 75 years, unless early forms of precancerous polyps or small growths are found.  People who are at an increased risk for hepatitis B should be screened for this virus. You are considered at high risk for hepatitis B if:  You were born in a country where hepatitis B occurs often. Talk with your health care provider about which countries are considered high risk.  Your parents were born in a high-risk country and you have not received a shot to protect against hepatitis B (hepatitis B vaccine).  You have HIV or AIDS.  You use needles to inject street drugs.  You live with, or have sex with, someone who has hepatitis B.  You get hemodialysis treatment.  You take certain medicines for conditions like cancer, organ transplantation, and autoimmune conditions.  Hepatitis C blood testing is recommended for all people born from 1945 through 1965 and any individual with known risks for hepatitis C.  Practice safe sex. Use condoms and avoid high-risk sexual practices to reduce the spread of sexually transmitted infections (STIs). STIs include gonorrhea, chlamydia, syphilis, trichomonas, herpes, HPV, and human immunodeficiency virus (HIV). Herpes, HIV, and HPV are viral illnesses that have no cure. They can result in disability, cancer, and death.  You should be screened for sexually transmitted illnesses (STIs) including gonorrhea and chlamydia if:  You are sexually active and are younger than 24 years.  You are older than 24 years and your health care provider tells you that you are at risk for this type of infection.  Your sexual activity has changed  since you were last screened and you are at an increased risk for chlamydia or gonorrhea. Ask your health care provider if you are at risk.  If you are at risk of being infected with HIV, it is recommended that you take a prescription medicine daily to prevent HIV infection. This is called preexposure prophylaxis (PrEP). You are considered at risk if:  You are sexually active and do not regularly use condoms or know the HIV status of your partner(s).  You take drugs by injection.  You are sexually active with a partner who has HIV.  Talk with your health care provider about whether you are at high risk of being infected with HIV. If   you choose to begin PrEP, you should first be tested for HIV. You should then be tested every 3 months for as long as you are taking PrEP.  Osteoporosis is a disease in which the bones lose minerals and strength with aging. This can result in serious bone fractures or breaks. The risk of osteoporosis can be identified using a bone density scan. Women ages 16 years and over and women at risk for fractures or osteoporosis should discuss screening with their health care providers. Ask your health care provider whether you should take a calcium supplement or vitamin D to reduce the rate of osteoporosis.  Menopause can be associated with physical symptoms and risks. Hormone replacement therapy is available to decrease symptoms and risks. You should talk to your health care provider about whether hormone replacement therapy is right for you.  Use sunscreen. Apply sunscreen liberally and repeatedly throughout the day. You should seek shade when your shadow is shorter than you. Protect yourself by wearing long sleeves, pants, a wide-brimmed hat, and sunglasses year round, whenever you are outdoors.  Once a month, do a whole body skin exam, using a mirror to look at the skin on your back. Tell your health care provider of new moles, moles that have irregular borders, moles that  are larger than a pencil eraser, or moles that have changed in shape or color.  Stay current with required vaccines (immunizations).  Influenza vaccine. All adults should be immunized every year.  Tetanus, diphtheria, and acellular pertussis (Td, Tdap) vaccine. Pregnant women should receive 1 dose of Tdap vaccine during each pregnancy. The dose should be obtained regardless of the length of time since the last dose. Immunization is preferred during the 27th-36th week of gestation. An adult who has not previously received Tdap or who does not know her vaccine status should receive 1 dose of Tdap. This initial dose should be followed by tetanus and diphtheria toxoids (Td) booster doses every 10 years. Adults with an unknown or incomplete history of completing a 3-dose immunization series with Td-containing vaccines should begin or complete a primary immunization series including a Tdap dose. Adults should receive a Td booster every 10 years.  Varicella vaccine. An adult without evidence of immunity to varicella should receive 2 doses or a second dose if she has previously received 1 dose. Pregnant females who do not have evidence of immunity should receive the first dose after pregnancy. This first dose should be obtained before leaving the health care facility. The second dose should be obtained 4-8 weeks after the first dose.  Human papillomavirus (HPV) vaccine. Females aged 13-26 years who have not received the vaccine previously should obtain the 3-dose series. The vaccine is not recommended for use in pregnant females. However, pregnancy testing is not needed before receiving a dose. If a female is found to be pregnant after receiving a dose, no treatment is needed. In that case, the remaining doses should be delayed until after the pregnancy. Immunization is recommended for any person with an immunocompromised condition through the age of 78 years if she did not get any or all doses earlier. During the  3-dose series, the second dose should be obtained 4-8 weeks after the first dose. The third dose should be obtained 24 weeks after the first dose and 16 weeks after the second dose.  Zoster vaccine. One dose is recommended for adults aged 55 years or older unless certain conditions are present.  Measles, mumps, and rubella (MMR) vaccine. Adults born  before 1957 generally are considered immune to measles and mumps. Adults born in [birth year] or later should have 1 or more doses of MMR vaccine unless there is a contraindication to the vaccine or there is laboratory evidence of immunity to each of the three diseases. A routine second dose of MMR vaccine should be obtained at least 28 days after the first dose for students attending postsecondary schools, health care workers, or international travelers. People who received inactivated measles vaccine or an unknown type of measles vaccine during 1963-1967 should receive 2 doses of MMR vaccine. People who received inactivated mumps vaccine or an unknown type of mumps vaccine before 1979 and are at high risk for mumps infection should consider immunization with 2 doses of MMR vaccine. For females of childbearing age, rubella immunity should be determined. If there is no evidence of immunity, females who are not pregnant should be vaccinated. If there is no evidence of immunity, females who are pregnant should delay immunization until after pregnancy. Unvaccinated health care workers born before [birth year] who lack laboratory evidence of measles, mumps, or rubella immunity or laboratory confirmation of disease should consider measles and mumps immunization with 2 doses of MMR vaccine or rubella immunization with 1 dose of MMR vaccine.  Pneumococcal 13-valent conjugate (PCV13) vaccine. When indicated, a person who is uncertain of his immunization history and has no record of immunization should receive the PCV13 vaccine. All adults 28 years of age and older should receive this  vaccine. An adult aged 105 years or older who has certain medical conditions and has not been previously immunized should receive 1 dose of PCV13 vaccine. This PCV13 should be followed with a dose of pneumococcal polysaccharide (PPSV23) vaccine. Adults who are at high risk for pneumococcal disease should obtain the PPSV23 vaccine at least 8 weeks after the dose of PCV13 vaccine. Adults older than 61 years of age who have normal immune system function should obtain the PPSV23 vaccine dose at least 1 year after the dose of PCV13 vaccine.  Pneumococcal polysaccharide (PPSV23) vaccine. When PCV13 is also indicated, PCV13 should be obtained first. All adults aged 61 years and older should be immunized. An adult younger than age 50 years who has certain medical conditions should be immunized. Any person who resides in a nursing home or long-term care facility should be immunized. An adult smoker should be immunized. People with an immunocompromised condition and certain other conditions should receive both PCV13 and PPSV23 vaccines. People with human immunodeficiency virus (HIV) infection should be immunized as soon as possible after diagnosis. Immunization during chemotherapy or radiation therapy should be avoided. Routine use of PPSV23 vaccine is not recommended for American Indians, [place] Natives, or people younger than 65 years unless there are medical conditions that require PPSV23 vaccine. When indicated, people who have unknown immunization and have no record of immunization should receive PPSV23 vaccine. One-time revaccination 5 years after the first dose of PPSV23 is recommended for people aged 19-64 years who have chronic kidney failure, nephrotic syndrome, asplenia, or immunocompromised conditions. People who received 1-2 doses of PPSV23 before age 62 years should receive another dose of PPSV23 vaccine at age 27 years or later if at least 5 years have passed since the previous dose. Doses of PPSV23 are not  needed for people immunized with PPSV23 at or after age 110 years.  Meningococcal vaccine. Adults with asplenia or persistent complement component deficiencies should receive 2 doses of quadrivalent meningococcal conjugate (MenACWY-D) vaccine. The doses should be obtained  at least 2 months apart. Microbiologists working with certain meningococcal bacteria, [place] recruits, people at risk during an outbreak, and people who travel to or live in countries with a high rate of meningitis should be immunized. A first-year college student up through age 34 years who is living in a residence hall should receive a dose if she did not receive a dose on or after her 16th birthday. Adults who have certain high-risk conditions should receive one or more doses of vaccine.  Hepatitis A vaccine. Adults who wish to be protected from this disease, have certain high-risk conditions, work with hepatitis A-infected animals, work in hepatitis A research labs, or travel to or work in countries with a high rate of hepatitis A should be immunized. Adults who were previously unvaccinated and who anticipate close contact with an international adoptee during the first 60 days after arrival in the [place] from a country with a high rate of hepatitis A should be immunized.  Hepatitis B vaccine. Adults who wish to be protected from this disease, have certain high-risk conditions, may be exposed to blood or other infectious body fluids, are household contacts or sex partners of hepatitis B positive people, are clients or workers in certain care facilities, or travel to or work in countries with a high rate of hepatitis B should be immunized.  Haemophilus influenzae type b (Hib) vaccine. A previously unvaccinated person with asplenia or sickle cell disease or having a scheduled splenectomy should receive 1 dose of Hib vaccine. Regardless of previous immunization, a recipient of a hematopoietic stem cell transplant should receive a  3-dose series 6-12 months after her successful transplant. Hib vaccine is not recommended for adults with HIV infection. Preventive Services / Frequency Ages 35 to 4 years  Blood pressure check.** / Every 3-5 years.  Lipid and cholesterol check.** / Every 5 years beginning at age 60.  Clinical breast exam.** / Every 3 years for women in their 71s and 10s.  BRCA-related cancer risk assessment.** / For women who have family members with a BRCA-related cancer (breast, ovarian, tubal, or peritoneal cancers).  Pap test.** / Every 2 years from ages 76 through 26. Every 3 years starting at age 61 through age 76 or 93 with a history of 3 consecutive normal Pap tests.  HPV screening.** / Every 3 years from ages 37 through ages 60 to 51 with a history of 3 consecutive normal Pap tests.  Hepatitis C blood test.** / For any individual with known risks for hepatitis C.  Skin self-exam. / Monthly.  Influenza vaccine. / Every year.  Tetanus, diphtheria, and acellular pertussis (Tdap, Td) vaccine.** / Consult your health care provider. Pregnant women should receive 1 dose of Tdap vaccine during each pregnancy. 1 dose of Td every 10 years.  Varicella vaccine.** / Consult your health care provider. Pregnant females who do not have evidence of immunity should receive the first dose after pregnancy.  HPV vaccine. / 3 doses over 6 months, if 93 and younger. The vaccine is not recommended for use in pregnant females. However, pregnancy testing is not needed before receiving a dose.  Measles, mumps, rubella (MMR) vaccine.** / You need at least 1 dose of MMR if you were born in 1957 or later. You may also need a 2nd dose. For females of childbearing age, rubella immunity should be determined. If there is no evidence of immunity, females who are not pregnant should be vaccinated. If there is no evidence of immunity, females who are  pregnant should delay immunization until after pregnancy.  Pneumococcal  13-valent conjugate (PCV13) vaccine.** / Consult your health care provider.  Pneumococcal polysaccharide (PPSV23) vaccine.** / 1 to 2 doses if you smoke cigarettes or if you have certain conditions.  Meningococcal vaccine.** / 1 dose if you are age 60 to 34 years and a Market researcher living in a residence hall, or have one of several medical conditions, you need to get vaccinated against meningococcal disease. You may also need additional booster doses.  Hepatitis A vaccine.** / Consult your health care provider.  Hepatitis B vaccine.** / Consult your health care provider.  Haemophilus influenzae type b (Hib) vaccine.** / Consult your health care provider. Ages 68 to 41 years  Blood pressure check.** / Every year.  Lipid and cholesterol check.** / Every 5 years beginning at age 12 years.  Lung cancer screening. / Every year if you are aged 35-80 years and have a 30-pack-year history of smoking and currently smoke or have quit within the past 15 years. Yearly screening is stopped once you have quit smoking for at least 15 years or develop a health problem that would prevent you from having lung cancer treatment.  Clinical breast exam.** / Every year after age 72 years.  BRCA-related cancer risk assessment.** / For women who have family members with a BRCA-related cancer (breast, ovarian, tubal, or peritoneal cancers).  Mammogram.** / Every year beginning at age 13 years and continuing for as long as you are in good health. Consult with your health care provider.  Pap test.** / Every 3 years starting at age 76 years through age 76 or 39 years with a history of 3 consecutive normal Pap tests.  HPV screening.** / Every 3 years from ages 49 years through ages 41 to 32 years with a history of 3 consecutive normal Pap tests.  Fecal occult blood test (FOBT) of stool. / Every year beginning at age 73 years and continuing until age 60 years. You may not need to do this test if you get  a colonoscopy every 10 years.  Flexible sigmoidoscopy or colonoscopy.** / Every 5 years for a flexible sigmoidoscopy or every 10 years for a colonoscopy beginning at age 69 years and continuing until age 38 years.  Hepatitis C blood test.** / For all people born from [birth year] through 1966 and any individual with known risks for hepatitis C.  Skin self-exam. / Monthly.  Influenza vaccine. / Every year.  Tetanus, diphtheria, and acellular pertussis (Tdap/Td) vaccine.** / Consult your health care provider. Pregnant women should receive 1 dose of Tdap vaccine during each pregnancy. 1 dose of Td every 10 years.  Varicella vaccine.** / Consult your health care provider. Pregnant females who do not have evidence of immunity should receive the first dose after pregnancy.  Zoster vaccine.** / 1 dose for adults aged 33 years or older.  Measles, mumps, rubella (MMR) vaccine.** / You need at least 1 dose of MMR if you were born in 1957 or later. You may also need a second dose. For females of childbearing age, rubella immunity should be determined. If there is no evidence of immunity, females who are not pregnant should be vaccinated. If there is no evidence of immunity, females who are pregnant should delay immunization until after pregnancy.  Pneumococcal 13-valent conjugate (PCV13) vaccine.** / Consult your health care provider.  Pneumococcal polysaccharide (PPSV23) vaccine.** / 1 to 2 doses if you smoke cigarettes or if you have certain conditions.  Meningococcal vaccine.** /  Consult your health care provider.  Hepatitis A vaccine.** / Consult your health care provider.  Hepatitis B vaccine.** / Consult your health care provider.  Haemophilus influenzae type b (Hib) vaccine.** / Consult your health care provider. Ages 25 years and over  Blood pressure check.** / Every year.  Lipid and cholesterol check.** / Every 5 years beginning at age 27 years.  Lung cancer screening. / Every year if you  are aged 19-80 years and have a 30-pack-year history of smoking and currently smoke or have quit within the past 15 years. Yearly screening is stopped once you have quit smoking for at least 15 years or develop a health problem that would prevent you from having lung cancer treatment.  Clinical breast exam.** / Every year after age 86 years.  BRCA-related cancer risk assessment.** / For women who have family members with a BRCA-related cancer (breast, ovarian, tubal, or peritoneal cancers).  Mammogram.** / Every year beginning at age 60 years and continuing for as long as you are in good health. Consult with your health care provider.  Pap test.** / Every 3 years starting at age 32 years through age 81 or 61 years with 3 consecutive normal Pap tests. Testing can be stopped between 65 and 70 years with 3 consecutive normal Pap tests and no abnormal Pap or HPV tests in the past 10 years.  HPV screening.** / Every 3 years from ages 66 years through ages 63 or 4 years with a history of 3 consecutive normal Pap tests. Testing can be stopped between 65 and 70 years with 3 consecutive normal Pap tests and no abnormal Pap or HPV tests in the past 10 years.  Fecal occult blood test (FOBT) of stool. / Every year beginning at age 94 years and continuing until age 63 years. You may not need to do this test if you get a colonoscopy every 10 years.  Flexible sigmoidoscopy or colonoscopy.** / Every 5 years for a flexible sigmoidoscopy or every 10 years for a colonoscopy beginning at age 57 years and continuing until age 38 years.  Hepatitis C blood test.** / For all people born from [birth year] through 1966 and any individual with known risks for hepatitis C.  Osteoporosis screening.** / A one-time screening for women ages 29 years and over and women at risk for fractures or osteoporosis.  Skin self-exam. / Monthly.  Influenza vaccine. / Every year.  Tetanus, diphtheria, and acellular pertussis (Tdap/Td)  vaccine.** / 1 dose of Td every 10 years.  Varicella vaccine.** / Consult your health care provider.  Zoster vaccine.** / 1 dose for adults aged 54 years or older.  Pneumococcal 13-valent conjugate (PCV13) vaccine.** / Consult your health care provider.  Pneumococcal polysaccharide (PPSV23) vaccine.** / 1 dose for all adults aged 16 years and older.  Meningococcal vaccine.** / Consult your health care provider.  Hepatitis A vaccine.** / Consult your health care provider.  Hepatitis B vaccine.** / Consult your health care provider.  Haemophilus influenzae type b (Hib) vaccine.** / Consult your health care provider. ** Family history and personal history of risk and conditions may change your health care provider's recommendations.   This information is not intended to replace advice given to you by your health care provider. Make sure you discuss any questions you have with your health care provider.   Document Released: 12/10/2001 Document Revised: 11/04/2014 Document Reviewed: 03/11/2011 Elsevier Interactive Patient Education [employer].

## 2016-03-12 NOTE — Progress Notes (Signed)
Subjective:     Debra [name] is a 60 y.o. female and is here for a comprehensive physical exam. The patient reports problems - fatigue.  Social History   Social History  . Marital Status: Legally Separated    Spouse Name: N/A  . Number of Children: 3  . Years of Education: N/A   Occupational History  . SECURITY GUARD    Social History Main Topics  . Smoking status: Current Every Day Smoker -- 1.00 packs/day for 20 years    Types: Cigarettes  . Smokeless tobacco: Not on file  . Alcohol Use: 0.0 oz/week    0 Standard drinks or equivalent per week     Comment: 2-3 drinks per week (beer/wine)  . Drug Use: No  . Sexual Activity:    Partners: Male    Birth Control/ Protection: Post-menopausal   Other Topics Concern  . Not on file   Social History Narrative   Health Maintenance  Topic Date Due  . ZOSTAVAX  09/11/2016 (Originally 07/26/2015)  . INFLUENZA VACCINE  05/28/2016  . MAMMOGRAM  04/03/2017  . DEXA SCAN  04/03/2017  . PAP SMEAR  02/20/2018  . COLONOSCOPY  07/28/2019  . TETANUS/TDAP  11/02/2020  . Hepatitis C Screening  Completed  . HIV Screening  Completed    The following portions of the patient's history were reviewed and updated as appropriate:  She  has a past medical history of GERD (gastroesophageal reflux disease); Anxiety; Thyroid disease; Mixed connective tissue disease ([place]); and DDD (degenerative disc disease). She  does not have any pertinent problems on file. She  has past surgical history that includes Carpal tunnel release; Leg Surgery; Cervical spine surgery; and Wrist fracture surgery (10-02-15). Her family history includes Arthritis in her mother; Breast cancer in her mother; Cancer in her mother; Colon cancer in her maternal grandfather; Diabetes in her sister; Heart disease in her father; Heart disease (age of onset: 87) in her mother; Hyperlipidemia in her sister; Hypertension in her father and sister; Stroke in her father. She  reports that  she has been smoking Cigarettes.  She has a 20 pack-year smoking history. She does not have any smokeless tobacco history on file. She reports that she drinks alcohol. She reports that she does not use illicit drugs. She has a current medication list which includes the following prescription(s): ferrous sulfate, gabapentin, levothyroxine, losartan, metoprolol succinate, paroxetine, tramadol, omega-3 fatty acids, and oxycodone-acetaminophen. Current Outpatient Prescriptions on File Prior to Visit  Medication Sig Dispense Refill  . ferrous sulfate (SLOW FE) 160 (50 FE) MG TBCR SR tablet Take 1 tablet (160 mg total) by mouth daily. 90 each 3  . gabapentin (NEURONTIN) 600 MG tablet Take 1 tablet (600 mg total) by mouth 3 (three) times daily. 90 tablet 5  . levothyroxine (SYNTHROID, LEVOTHROID) 50 MCG tablet TAKE ONE TABLET BY MOUTH ONCE DAILY 30 tablet 5  . PARoxetine (PAXIL) 30 MG tablet Take 1 tablet (30 mg total) by mouth daily. 90 tablet 1  . Omega-3 Fatty Acids (FISH OIL PO) Take 1 tablet by mouth as needed. Reported on 03/12/2016    . oxyCODONE-acetaminophen (PERCOCET) 7.5-325 MG tablet Take 1 tablet by mouth every 4 (four) hours as needed for severe pain. Reported on 03/12/2016     No current facility-administered medications on file prior to visit.   She is allergic to penicillins and codeine..  Review of Systems Review of Systems  Constitutional: Negative for activity change, appetite change + fatigue HENT: Negative  for hearing loss, congestion, tinnitus and ear discharge.  dentist q74m Eyes: Negative for visual disturbance (see optho q1y -- vision corrected to 20/20 with glasses).  Respiratory: Negative for cough, chest tightness and shortness of breath.   Cardiovascular: Negative for chest pain, palpitations and leg swelling.  Gastrointestinal: Negative for abdominal pain, diarrhea, constipation and abdominal distention.  Genitourinary: Negative for urgency, frequency, decreased urine  volume and difficulty urinating.  Musculoskeletal: Negative for back pain, arthralgias and gait problem.  Skin: Negative for color change, pallor and rash.  Neurological: Negative for dizziness, light-headedness, numbness and headaches.  Hematological: Negative for adenopathy. Does not bruise/bleed easily.  Psychiatric/Behavioral: Negative for suicidal ideas, confusion, sleep disturbance, self-injury, dysphoric mood, decreased concentration and agitation.       Objective:    BP 140/100 mmHg  Pulse 82  Temp(Src) 98.2 F (36.8 C) (Oral)  Ht 5\' 4"  (1.626 m)  Wt 164 lb 6.4 oz (74.571 kg)  BMI 28.21 kg/m2  SpO2 96% General appearance: alert, cooperative, appears stated age and no distress Head: Normocephalic, without obvious abnormality, atraumatic Eyes: negative findings: lids and lashes normal, conjunctivae and sclerae normal and pupils equal, round, reactive to light and accomodation Ears: normal TM's and external ear canals both ears Nose: Nares normal. Septum midline. Mucosa normal. No drainage or sinus tenderness. Throat: lips, mucosa, and tongue normal; teeth and gums normal Neck: no adenopathy, supple, symmetrical, trachea midline and thyroid not enlarged, symmetric, no tenderness/mass/nodules Back: symmetric, no curvature. ROM normal. No CVA tenderness. Lungs: clear to auscultation bilaterally Breasts: normal appearance, no masses or tenderness Heart: S1, S2 normal Abdomen: soft, non-tender; bowel sounds normal; no masses,  no organomegaly Pelvic: deferred Extremities: extremities normal, atraumatic, no cyanosis or edema Pulses: 2+ and symmetric Skin: Skin color, texture, turgor normal. No rashes or lesions Lymph nodes: Cervical, supraclavicular, and axillary nodes normal. Neurologic: Alert and oriented X 3, normal strength and tone. Normal symmetric reflexes. Normal coordination and gait    Assessment:    Healthy female exam.      Plan:     ghm utd Check labs See  After Visit Summary for Counseling Recommendations    1. Essential hypertension Slightly elevated today--- pt admitted to not taking meds regularly Importance of taking meds regularly d/w pt  - metoprolol succinate (TOPROL-XL) 50 MG 24 hr tablet; TAKE ONE TABLET BY MOUTH TWICE DAILY  Dispense: 60 tablet; Refill: 5 - losartan (COZAAR) 100 MG tablet; Take 1 tablet (100 mg total) by mouth daily.  Dispense: 90 tablet; Refill: 3 - Comprehensive metabolic panel - Lipid panel - POCT urinalysis dipstick - TSH - Vitamin B12 - Vitamin D 1,25 dihydroxy  2. Chronic fatigue   - Comprehensive metabolic panel - Lipid panel - POCT urinalysis dipstick - TSH - Vitamin B12 - Vitamin D 1,25 dihydroxy  3. Hyperglycemia   - Hemoglobin A1c  4. Preventative health care    5. Estrogen deficiency   - DG Bone Density; Future  6. Breast cancer screening   - MM DIGITAL SCREENING BILATERAL; Future  7. Blurry vision   - Ambulatory referral to Ophthalmology  8. Other abnormal findings in urine   - Urine culture

## 2016-03-12 NOTE — Progress Notes (Signed)
Pre visit review using our clinic review tool, if applicable. No additional management support is needed unless otherwise documented below in the visit note. 

## 2016-03-12 NOTE — Addendum Note (Signed)
Addended by: Harl Bowie on: 03/12/2016 06:49 PM   Modules accepted: Orders

## 2016-03-13 LAB — COMPREHENSIVE METABOLIC PANEL
ALBUMIN: 4.2 g/dL (ref 3.5–5.2)
ALK PHOS: 91 U/L (ref 39–117)
ALT: 13 U/L (ref 0–35)
AST: 23 U/L (ref 0–37)
BILIRUBIN TOTAL: 0.5 mg/dL (ref 0.2–1.2)
BUN: 10 mg/dL (ref 6–23)
CALCIUM: 9.9 mg/dL (ref 8.4–10.5)
CO2: 28 meq/L (ref 19–32)
Chloride: 108 mEq/L (ref 96–112)
Creatinine, Ser: 0.64 mg/dL (ref 0.40–1.20)
GFR: 121.47 mL/min (ref >60.00)
Glucose, Bld: 119 mg/dL — ABNORMAL HIGH (ref 70–99)
Potassium: 4.6 mEq/L (ref 3.5–5.1)
Sodium: 141 mEq/L (ref 135–145)
TOTAL PROTEIN: 7.7 g/dL (ref 6.0–8.3)

## 2016-03-13 LAB — VITAMIN B12: Vitamin B-12: 342 pg/mL (ref 211–911)

## 2016-03-13 LAB — LIPID PANEL
CHOLESTEROL: 203 mg/dL — AB (ref 0–200)
HDL: 45.2 mg/dL (ref >39.00)
LDL Cholesterol: 144 mg/dL — ABNORMAL HIGH (ref 0–99)
NonHDL: 157.39
TRIGLYCERIDES: 68 mg/dL (ref 0.0–149.0)
Total CHOL/HDL Ratio: 4
VLDL: 13.6 mg/dL (ref 0.0–40.0)

## 2016-03-13 LAB — TSH: TSH: 0.76 u[IU]/mL (ref 0.35–4.50)

## 2016-03-13 LAB — HEMOGLOBIN A1C: HEMOGLOBIN A1C: 6.3 % (ref 4.6–6.5)

## 2016-03-14 LAB — URINE CULTURE
COLONY COUNT: NO GROWTH
Organism ID, Bacteria: NO GROWTH

## 2016-03-14 LAB — VITAMIN D 1,25 DIHYDROXY
VITAMIN D 1, 25 (OH) TOTAL: 34 pg/mL (ref 18–72)
VITAMIN D3 1, 25 (OH): 34 pg/mL
Vitamin D2 1, 25 (OH)2: <8 pg/mL

## 2016-03-18 MED ORDER — PRAVASTATIN SODIUM 20 MG PO TABS: 20.0000 mg | ORAL_TABLET | Freq: Every evening | ORAL | Status: DC

## 2016-03-18 NOTE — Addendum Note (Signed)
Addended by: Dorrene German on: 03/18/2016 11:52 AM   Modules accepted: Orders, SmartSet

## 2016-03-21 ENCOUNTER — Telehealth: Payer: Self-pay | Admitting: Family Medicine

## 2016-03-21 NOTE — Telephone Encounter (Signed)
Spoke with patient and she stated she has been taking Pravastatin for a few days and she stated she started to develop seeing lighting and different colors across her eyes. She said it happens when she turns her head or lays down and has been going on for 2 days, she has not had any symptoms in the last few hours but she does have an apt in the morning with Dr.Hecker. She is having blurred vision which has ongoing, she is not developed any other symptoms. I made her aware if she got any new symptoms developing, drooping to face, loss of vision, CP, or numbness on one side of the body to go to the ED and she had agreed to do so. I also advise I would follow up tomorrow with Dr.Lowne recommendations and she verbalized understanding.     KP

## 2016-03-21 NOTE — Telephone Encounter (Signed)
Can be reached: [phone] Pharmacy: [place], [place]  Reason for call: Pt states she has seen spots of light in her eyes since she started taking the pravastatin. She would like call back to see if this is a side effect of the med or something else might be going on.

## 2016-03-22 DIAGNOSIS — H40023 Open angle with borderline findings, high risk, bilateral: Secondary | ICD-10-CM | POA: Diagnosis not present

## 2016-03-22 DIAGNOSIS — H5319 Other subjective visual disturbances: Secondary | ICD-10-CM | POA: Diagnosis not present

## 2016-03-22 DIAGNOSIS — H40021 Open angle with borderline findings, high risk, right eye: Secondary | ICD-10-CM | POA: Diagnosis not present

## 2016-03-22 DIAGNOSIS — H43393 Other vitreous opacities, bilateral: Secondary | ICD-10-CM | POA: Diagnosis not present

## 2016-03-22 DIAGNOSIS — H40022 Open angle with borderline findings, high risk, left eye: Secondary | ICD-10-CM | POA: Diagnosis not present

## 2016-03-22 LAB — HM DIABETES EYE EXAM

## 2016-03-22 NOTE — Telephone Encounter (Signed)
Agree--- I don't think the pravachol is causing it but I would like to know what Dr Herbert Deaner tells pt about her eyes.   She may need ov

## 2016-03-22 NOTE — Telephone Encounter (Signed)
noted 

## 2016-03-22 NOTE — Telephone Encounter (Signed)
Exam was done and will be sent over, she said the Doctor said it could be age related. She was having floaters and strips and she does have a 3 week follow up, but if it increases to come back sooner. Other than that the eyes were fine he gave her systane eye drops and express concerns of a detachment and the importance of follow up. She will continue the pravastatin for now and if anything changes she will call for an appointment.     KP

## 2016-03-27 ENCOUNTER — Encounter: Payer: Self-pay | Admitting: Family Medicine

## 2016-04-09 ENCOUNTER — Ambulatory Visit
Admission: RE | Admit: 2016-04-09 | Discharge: 2016-04-09 | Disposition: A | Discharge status: Home / Self Care | Payer: BLUE CROSS/BLUE SHIELD | Source: Ambulatory Visit | Attending: Family Medicine | Admitting: Family Medicine

## 2016-04-09 DIAGNOSIS — Z1231 Encounter for screening mammogram for malignant neoplasm of breast: Secondary | ICD-10-CM | POA: Diagnosis not present

## 2016-04-09 DIAGNOSIS — Z1239 Encounter for other screening for malignant neoplasm of breast: Secondary | ICD-10-CM

## 2016-04-16 DIAGNOSIS — H43393 Other vitreous opacities, bilateral: Secondary | ICD-10-CM | POA: Diagnosis not present

## 2016-04-16 DIAGNOSIS — H43811 Vitreous degeneration, right eye: Secondary | ICD-10-CM | POA: Diagnosis not present

## 2016-04-16 DIAGNOSIS — H5319 Other subjective visual disturbances: Secondary | ICD-10-CM | POA: Diagnosis not present

## 2016-04-18 ENCOUNTER — Telehealth: Payer: Self-pay | Admitting: Family Medicine

## 2016-04-18 NOTE — Telephone Encounter (Signed)
°  Relation to [id number] Call back number: [phone] Pharmacy:  Reason for call:  Patient was seen 03/12/16 and states she forgot what PCP advised regarding patient sugar intake. Please advise

## 2016-04-18 NOTE — Telephone Encounter (Signed)
Notes Recorded by Ann Held, DO on 03/15/2016 at 8:44 PM Cholesterol--- LDL goal < 100, HDL >40, TG < 150. Diet and exercise will increase HDL and decrease LDL and TG. Fish, Fish Oil, Flaxseed oil will also help increase the HDL and decrease Triglycerides.  Recheck labs in 3 months----- Start pravachol 20 mg #30 1 each night, 2 refills Glucose elevated--- watch simple sugars and starches. Vita D -- low normal--- Vita D3 1000 u daily  Lipid, cmp, hgba1c   Discussed results with the patient and she stated she could not remember what was told to her, she asked for suggestions and I advised to watch her sugars intake as well as carb intake. She verbalized understanding, I offered to mail DM education material and she agreed. Information mailed to the patient.   KP

## 2016-05-14 ENCOUNTER — Ambulatory Visit (INDEPENDENT_AMBULATORY_CARE_PROVIDER_SITE_OTHER): Payer: BLUE CROSS/BLUE SHIELD | Admitting: Pulmonary Disease

## 2016-05-14 ENCOUNTER — Encounter: Payer: Self-pay | Admitting: Pulmonary Disease

## 2016-05-14 VITALS — BP 128/82 | HR 64 | Ht 64.5 in | Wt 163.0 lb

## 2016-05-14 DIAGNOSIS — G4733 Obstructive sleep apnea (adult) (pediatric): Secondary | ICD-10-CM

## 2016-05-14 NOTE — Progress Notes (Signed)
Current Outpatient Prescriptions on File Prior to Visit  Medication Sig  . ferrous sulfate (SLOW FE) 160 (50 FE) MG TBCR SR tablet Take 1 tablet (160 mg total) by mouth daily.  Marland Kitchen gabapentin (NEURONTIN) 600 MG tablet Take 1 tablet (600 mg total) by mouth 3 (three) times daily.  Marland Kitchen levothyroxine (SYNTHROID, LEVOTHROID) 50 MCG tablet TAKE ONE TABLET BY MOUTH ONCE DAILY  . losartan (COZAAR) 100 MG tablet Take 1 tablet (100 mg total) by mouth daily.  . metoprolol succinate (TOPROL-XL) 50 MG 24 hr tablet TAKE ONE TABLET BY MOUTH TWICE DAILY  . Omega-3 Fatty Acids (FISH OIL PO) Take 1 tablet by mouth as needed. Reported on 03/12/2016  . PARoxetine (PAXIL) 30 MG tablet Take 1 tablet (30 mg total) by mouth daily.  . pravastatin (PRAVACHOL) 20 MG tablet Take 1 tablet (20 mg total) by mouth every evening.  . traMADol (ULTRAM) 50 MG tablet Take 50 mg by mouth every 6 (six) hours as needed.  Marland Kitchen oxyCODONE-acetaminophen (PERCOCET) 7.5-325 MG tablet Take 1 tablet by mouth every 4 (four) hours as needed for severe pain. Reported on 05/14/2016   No current facility-administered medications on file prior to visit.    Chief Complaint  Patient presents with  . Follow-up    Pt has not used CPAP - feared getting infections from the machine. Pt has not used in several months. DME:     Sleep tests HST 03/15/15 >> AHI 5.2, SaO2 low 92%. Auto CPAP 07/10/15 to 08/08/15 >> used on 5 of 30 nights with average 2 hrs and 27 min.  Average AHI is 0.2 with median CPAP 5 cm H2O and 95 th percentile CPAP 7 cm H20.  Past medical history GERD, Hypothyroidism, MCTD, Anxiety, Depression  Past surgical hx, Medications, Allergies, Family hx, Social hx all reviewed.  Vital signs BP 128/82 mmHg  Pulse 64  Ht 5' 4.5" (1.638 m)  Wt 163 lb (73.936 kg)  BMI 27.56 kg/m2  SpO2 98%   History of Present Illness: Debra [name] is a 60 y.o. female with mild OSA.  She broke her arm and was not able to put her mask on.  She was also  worried about infection risk if her mask wasn't cleaned properly.  She wants to get back on CPAP because it made her sleep better.  Physical Exam:  General - No distress ENT - No sinus tenderness, no oral exudate, no LAN, over bite, MP 4, scalloped tongue Cardiac - s1s2 regular, no murmur Chest - No wheeze/rales/dullness Back - No focal tenderness Abd - Soft, non-tender Ext - No edema Neuro - Normal strength Skin - No rashes Psych - normal mood, and behavior   Assessment/Plan:  Obstructive sleep apnea. - discussed how to improve compliance with CPAP and cleaning techniques - if no better, then consider oral appliance  Insomnia - she is to follow up with behavioral therapy   Patient Instructions  Follow up in 3 months    Chesley Mires, MD  Pulmonary/Critical Care/Sleep Pager:  [phone] 05/14/2016, 12:04 PM

## 2016-05-14 NOTE — Patient Instructions (Signed)
Follow up in 3 months

## 2016-08-05 ENCOUNTER — Ambulatory Visit: Payer: BLUE CROSS/BLUE SHIELD

## 2016-08-06 ENCOUNTER — Other Ambulatory Visit: Payer: Self-pay | Admitting: Family Medicine

## 2016-08-13 ENCOUNTER — Ambulatory Visit (INDEPENDENT_AMBULATORY_CARE_PROVIDER_SITE_OTHER): Payer: BLUE CROSS/BLUE SHIELD

## 2016-08-13 DIAGNOSIS — Z23 Encounter for immunization: Secondary | ICD-10-CM

## 2016-08-20 ENCOUNTER — Ambulatory Visit: Payer: BLUE CROSS/BLUE SHIELD | Admitting: Pulmonary Disease

## 2016-08-27 ENCOUNTER — Encounter: Payer: Self-pay | Admitting: Family Medicine

## 2016-08-27 ENCOUNTER — Ambulatory Visit (INDEPENDENT_AMBULATORY_CARE_PROVIDER_SITE_OTHER): Payer: BLUE CROSS/BLUE SHIELD | Admitting: Family Medicine

## 2016-08-27 VITALS — BP 132/88 | Temp 97.9°F | Resp 16 | Ht 64.0 in | Wt 155.2 lb

## 2016-08-27 DIAGNOSIS — F17210 Nicotine dependence, cigarettes, uncomplicated: Secondary | ICD-10-CM

## 2016-08-27 DIAGNOSIS — K219 Gastro-esophageal reflux disease without esophagitis: Secondary | ICD-10-CM

## 2016-08-27 DIAGNOSIS — R1011 Right upper quadrant pain: Secondary | ICD-10-CM | POA: Diagnosis not present

## 2016-08-27 DIAGNOSIS — F411 Generalized anxiety disorder: Secondary | ICD-10-CM | POA: Diagnosis not present

## 2016-08-27 LAB — POCT URINALYSIS DIPSTICK
Bilirubin, UA: NEGATIVE
GLUCOSE UA: NEGATIVE
Ketones, UA: NEGATIVE
Leukocytes, UA: NEGATIVE
Nitrite, UA: NEGATIVE
Protein, UA: NEGATIVE
RBC UA: NEGATIVE
UROBILINOGEN UA: 0.2
pH, UA: 6

## 2016-08-27 LAB — CBC WITH DIFFERENTIAL/PLATELET
BASOS PCT: 0.4 % (ref 0.0–3.0)
Basophils Absolute: 0 10*3/uL (ref 0.0–0.1)
EOS PCT: 1.6 % (ref 0.0–5.0)
Eosinophils Absolute: 0.1 10*3/uL (ref 0.0–0.7)
HEMATOCRIT: 36.6 % (ref 36.0–46.0)
HEMOGLOBIN: 12.1 g/dL (ref 12.0–15.0)
LYMPHS PCT: 30 % (ref 12.0–46.0)
Lymphs Abs: 1.7 10*3/uL (ref 0.7–4.0)
MCHC: 33 g/dL (ref 30.0–36.0)
MCV: 83.5 fl (ref 78.0–100.0)
Monocytes Absolute: 0.5 10*3/uL (ref 0.1–1.0)
Monocytes Relative: 8.8 % (ref 3.0–12.0)
NEUTROS ABS: 3.4 10*3/uL (ref 1.4–7.7)
Neutrophils Relative %: 59.2 % (ref 43.0–77.0)
PLATELETS: 280 10*3/uL (ref 150.0–400.0)
RBC: 4.38 Mil/uL (ref 3.87–5.11)
RDW: 13.6 % (ref 11.5–15.5)
WBC: 5.8 10*3/uL (ref 4.0–10.5)

## 2016-08-27 LAB — COMPREHENSIVE METABOLIC PANEL
ALBUMIN: 4.2 g/dL (ref 3.5–5.2)
ALK PHOS: 85 U/L (ref 39–117)
ALT: 11 U/L (ref 0–35)
AST: 17 U/L (ref 0–37)
BUN: 11 mg/dL (ref 6–23)
CALCIUM: 9.9 mg/dL (ref 8.4–10.5)
CO2: 28 mEq/L (ref 19–32)
Chloride: 106 mEq/L (ref 96–112)
Creatinine, Ser: 0.58 mg/dL (ref 0.40–1.20)
GFR: 135.88 mL/min (ref >60.00)
Glucose, Bld: 88 mg/dL (ref 70–99)
POTASSIUM: 5.1 meq/L (ref 3.5–5.1)
SODIUM: 139 meq/L (ref 135–145)
TOTAL PROTEIN: 7.7 g/dL (ref 6.0–8.3)
Total Bilirubin: 0.6 mg/dL (ref 0.2–1.2)

## 2016-08-27 LAB — LIPASE: LIPASE: 21 U/L (ref 11.0–59.0)

## 2016-08-27 LAB — H. PYLORI ANTIBODY, IGG: H Pylori IgG: NEGATIVE

## 2016-08-27 LAB — AMYLASE: AMYLASE: 64 U/L (ref 27–131)

## 2016-08-27 MED ORDER — VARENICLINE TARTRATE 0.5 MG X 11 & 1 MG X 42 PO MISC: ORAL | 0 refills | Status: DC

## 2016-08-27 MED ORDER — OMEPRAZOLE 40 MG PO CPDR: 40.0000 mg | DELAYED_RELEASE_CAPSULE | Freq: Every day | ORAL | 3 refills | Status: DC

## 2016-08-27 MED ORDER — PAROXETINE HCL 30 MG PO TABS: 30.0000 mg | ORAL_TABLET | Freq: Every day | ORAL | 1 refills | Status: DC

## 2016-08-27 NOTE — Patient Instructions (Addendum)
Smoking Cessation, Tips for Success If you are ready to quit smoking, congratulations! You have chosen to help yourself be healthier. Cigarettes bring nicotine, tar, carbon monoxide, and other irritants into your body. Your lungs, heart, and blood vessels will be able to work better without these poisons. There are many different ways to quit smoking. Nicotine gum, nicotine patches, a nicotine inhaler, or nicotine nasal spray can help with physical craving. Hypnosis, support groups, and medicines help break the habit of smoking. WHAT THINGS CAN I DO TO MAKE QUITTING EASIER?  Here are some tips to help you quit for good:  Pick a date when you will quit smoking completely. Tell all of your friends and family about your plan to quit on that date.  Do not try to slowly cut down on the number of cigarettes you are smoking. Pick a quit date and quit smoking completely starting on that day.  Throw away all cigarettes.   Clean and remove all ashtrays from your home, work, and car.  On a card, write down your reasons for quitting. Carry the card with you and read it when you get the urge to smoke.  Cleanse your body of nicotine. Drink enough water and fluids to keep your urine clear or pale yellow. Do this after quitting to flush the nicotine from your body.  Learn to predict your moods. Do not let a bad situation be your excuse to have a cigarette. Some situations in your life might tempt you into wanting a cigarette.  Never have "just one" cigarette. It leads to wanting another and another. Remind yourself of your decision to quit.  Change habits associated with smoking. If you smoked while driving or when feeling stressed, try other activities to replace smoking. Stand up when drinking your coffee. Brush your teeth after eating. Sit in a different chair when you read the paper. Avoid alcohol while trying to quit, and try to drink fewer caffeinated beverages. Alcohol and caffeine may urge you to  smoke.  Avoid foods and drinks that can trigger a desire to smoke, such as sugary or spicy foods and alcohol.  Ask people who smoke not to smoke around you.  Have something planned to do right after eating or having a cup of coffee. For example, plan to take a walk or exercise.  Try a relaxation exercise to calm you down and decrease your stress. Remember, you may be tense and nervous for the first 2 weeks after you quit, but this will pass.  Find new activities to keep your hands busy. Play with a pen, coin, or rubber band. Doodle or draw things on paper.  Brush your teeth right after eating. This will help cut down on the craving for the taste of tobacco after meals. You can also try mouthwash.   Use oral substitutes in place of cigarettes. Try using lemon drops, carrots, cinnamon sticks, or chewing gum. Keep them handy so they are available when you have the urge to smoke.  When you have the urge to smoke, try deep breathing.  Designate your home as a nonsmoking area.  If you are a heavy smoker, ask your health care provider about a prescription for nicotine chewing gum. It can ease your withdrawal from nicotine.  Reward yourself. Set aside the cigarette money you save and buy yourself something nice.  Look for support from others. Join a support group or smoking cessation program. Ask someone at home or at work to help you with your plan   to quit smoking.  Always ask yourself, "Do I need this cigarette or is this just a reflex?" Tell yourself, "Today, I choose not to smoke," or "I do not want to smoke." You are reminding yourself of your decision to quit.  Do not replace cigarette smoking with electronic cigarettes (commonly called e-cigarettes). The safety of e-cigarettes is unknown, and some may contain harmful chemicals.  If you relapse, do not give up! Plan ahead and think about what you will do the next time you get the urge to smoke. HOW WILL I FEEL WHEN I QUIT SMOKING? You  may have symptoms of withdrawal because your body is used to nicotine (the addictive substance in cigarettes). You may crave cigarettes, be irritable, feel very hungry, cough often, get headaches, or have difficulty concentrating. The withdrawal symptoms are only temporary. They are strongest when you first quit but will go away within 10-14 days. When withdrawal symptoms occur, stay in control. Think about your reasons for quitting. Remind yourself that these are signs that your body is healing and getting used to being without cigarettes. Remember that withdrawal symptoms are easier to treat than the major diseases that smoking can cause.  Even after the withdrawal is over, expect periodic urges to smoke. However, these cravings are generally short lived and will go away whether you smoke or not. Do not smoke! WHAT RESOURCES ARE AVAILABLE TO HELP ME QUIT SMOKING? Your health care provider can direct you to community resources or hospitals for support, which may include:  Group support.  Education.  Hypnosis.  Therapy.   This information is not intended to replace advice given to you by your health care provider. Make sure you discuss any questions you have with your health care provider.   Document Released: 07/12/2004 Document Revised: 11/04/2014 Document Reviewed: 04/01/2013 Elsevier Interactive Patient Education 2016 [place] for Gastroesophageal Reflux Disease, Adult When you have gastroesophageal reflux disease (GERD), the foods you eat and your eating habits are very important. Choosing the right foods can help ease the discomfort of GERD. WHAT GENERAL GUIDELINES DO I NEED TO FOLLOW?  Choose fruits, vegetables, whole grains, low-fat dairy products, and low-fat meat, fish, and poultry.  Limit fats such as oils, salad dressings, butter, nuts, and avocado.  Keep a food diary to identify foods that cause symptoms.  Avoid foods that cause reflux. These may be  different for different people.  Eat frequent small meals instead of three large meals each day.  Eat your meals slowly, in a relaxed setting.  Limit fried foods.  Cook foods using methods other than frying.  Avoid drinking alcohol.  Avoid drinking large amounts of liquids with your meals.  Avoid bending over or lying down until 2-3 hours after eating. WHAT FOODS ARE NOT RECOMMENDED? The following are some foods and drinks that may worsen your symptoms: Vegetables Tomatoes. Tomato juice. Tomato and spaghetti sauce. Chili peppers. Onion and garlic. Horseradish. Fruits Oranges, grapefruit, and lemon (fruit and juice). Meats High-fat meats, fish, and poultry. This includes hot dogs, ribs, ham, sausage, salami, and bacon. Dairy Whole milk and chocolate milk. Sour cream. Cream. Butter. Ice cream. Cream cheese.  Beverages Coffee and tea, with or without caffeine. Carbonated beverages or energy drinks. Condiments Hot sauce. Barbecue sauce.  Sweets/Desserts Chocolate and cocoa. Donuts. Peppermint and spearmint. Fats and Oils High-fat foods, including [place] fries and potato chips. Other Vinegar. Strong spices, such as black pepper, white pepper, red pepper, cayenne, curry powder, cloves, ginger, and chili  powder. The items listed above may not be a complete list of foods and beverages to avoid. Contact your dietitian for more information.   This information is not intended to replace advice given to you by your health care provider. Make sure you discuss any questions you have with your health care provider.   Document Released: 10/14/2005 Document Revised: 11/04/2014 Document Reviewed: 08/18/2013 Elsevier Interactive Patient Education [employer].

## 2016-08-27 NOTE — Progress Notes (Signed)
Pre visit review using our clinic review tool, if applicable. No additional management support is needed unless otherwise documented below in the visit note. 

## 2016-08-27 NOTE — Progress Notes (Signed)
Patient ID: Debra [name], female    DOB: 1956-06-14  Age: 60 y.o. MRN: [id number]    Subjective:  Subjective  HPI Debra [name] presents for sneezing , coughing. Nausea + heartburn ---  Hurts to eat . She tried tums and other otc with little relief.  No vomiting.   Pt has been under a lot of stress with school / homework.     Review of Systems  Constitutional: Negative for activity change, appetite change, chills, diaphoresis, fatigue, fever and unexpected weight change.  Eyes: Negative for pain, redness and visual disturbance.  Respiratory: Negative for cough, chest tightness, shortness of breath and wheezing.   Cardiovascular: Negative for chest pain, palpitations and leg swelling.  Gastrointestinal: Positive for abdominal pain and nausea. Negative for abdominal distention.  Endocrine: Negative for cold intolerance, heat intolerance, polydipsia, polyphagia and polyuria.  Genitourinary: Negative for difficulty urinating, dyspareunia, dysuria, flank pain, frequency, genital sores, hematuria, menstrual problem, pelvic pain, urgency, vaginal discharge and vaginal pain.  Musculoskeletal: Negative for back pain.  Neurological: Negative for dizziness, light-headedness, numbness and headaches.  Psychiatric/Behavioral: Positive for sleep disturbance. The patient is nervous/anxious.     History Past Medical History:  Diagnosis Date  . Anxiety   . DDD (degenerative disc disease)   . GERD (gastroesophageal reflux disease)   . 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 Thyroid disease    Hypothyroidism    She has a past surgical history that includes Carpal tunnel release; Leg Surgery; Cervical spine surgery; and Wrist fracture surgery (10-02-15).   Her family history includes Arthritis in her mother; Breast cancer in her mother; Cancer in her mother; Colon cancer in her maternal grandfather; Diabetes in her sister; Heart disease in her father; Heart disease (age of onset:  75) in her mother; Hyperlipidemia in her sister; Hypertension in her father and sister; Stroke in her father.She reports that she has been smoking Cigarettes.  She has a 20.00 pack-year smoking history. She does not have any smokeless tobacco history on file. She reports that she drinks alcohol. She reports that she does not use drugs.  Current Outpatient Prescriptions on File Prior to Visit  Medication Sig Dispense Refill  . ferrous sulfate (SLOW FE) 160 (50 FE) MG TBCR SR tablet Take 1 tablet (160 mg total) by mouth daily. 90 each 3  . gabapentin (NEURONTIN) 600 MG tablet Take 1 tablet (600 mg total) by mouth 3 (three) times daily. 90 tablet 5  . levothyroxine (SYNTHROID, LEVOTHROID) 50 MCG tablet TAKE ONE TABLET BY MOUTH ONCE DAILY 30 tablet 5  . losartan (COZAAR) 100 MG tablet Take 1 tablet (100 mg total) by mouth daily. 90 tablet 3  . metoprolol succinate (TOPROL-XL) 50 MG 24 hr tablet TAKE ONE TABLET BY MOUTH TWICE DAILY 60 tablet 5  . Omega-3 Fatty Acids (FISH OIL PO) Take 1 tablet by mouth as needed. Reported on 03/12/2016    . traMADol (ULTRAM) 50 MG tablet Take 50 mg by mouth every 6 (six) hours as needed.    Marland Kitchen oxyCODONE-acetaminophen (PERCOCET) 7.5-325 MG tablet Take 1 tablet by mouth every 4 (four) hours as needed for severe pain. Reported on 05/14/2016    . pravastatin (PRAVACHOL) 20 MG tablet Take 1 tablet (20 mg total) by mouth every evening. (Patient not taking: Reported on 08/27/2016) 30 tablet 2   No current facility-administered medications on file prior to visit.      Objective:  Objective  Physical Exam  Constitutional:  She is oriented to person, place, and time. She appears well-developed and well-nourished.  HENT:  Head: Normocephalic and atraumatic.  Eyes: Conjunctivae and EOM are normal.  Neck: Normal range of motion. Neck supple. No JVD present. Carotid bruit is not present. No thyromegaly present.  Cardiovascular: Normal rate, regular rhythm and normal heart sounds.    No murmur heard. Pulmonary/Chest: Effort normal and breath sounds normal. No respiratory distress. She has no wheezes. She has no rales. She exhibits no tenderness.  Abdominal: Soft. Bowel sounds are normal. She exhibits no mass. There is tenderness. There is guarding. There is no rebound.    Musculoskeletal: She exhibits no edema.  Neurological: She is alert and oriented to person, place, and time.  Psychiatric: She has a normal mood and affect. Her behavior is normal. Judgment and thought content normal.  Nursing note and vitals reviewed.  BP 132/88 (BP Location: Left Arm, Patient Position: Sitting, Cuff Size: Normal)   Temp 97.9 F (36.6 C) (Oral)   Resp 16   Ht 5\' 4"  (1.626 m)   Wt 155 lb 3.2 oz (70.4 kg)   BMI 26.64 kg/m  Wt Readings from Last 3 Encounters:  08/27/16 155 lb 3.2 oz (70.4 kg)  05/14/16 163 lb (73.9 kg)  03/12/16 164 lb 6.4 oz (74.6 kg)     Lab Results  Component Value Date   WBC 5.8 08/27/2016   HGB 12.1 08/27/2016   HCT 36.6 08/27/2016   PLT 280.0 08/27/2016   GLUCOSE 88 08/27/2016   CHOL 203 (H) 03/12/2016   TRIG 68.0 03/12/2016   HDL 45.20 03/12/2016   LDLDIRECT 126.3 06/06/2011   LDLCALC 144 (H) 03/12/2016   ALT 11 08/27/2016   AST 17 08/27/2016   NA 139 08/27/2016   K 5.1 08/27/2016   CL 106 08/27/2016   CREATININE 0.58 08/27/2016   BUN 11 08/27/2016   CO2 28 08/27/2016   TSH 0.76 03/12/2016   HGBA1C 6.3 03/12/2016   MICROALBUR 3.1 (H) 11/07/2014    Mm Digital Screening Bilateral  Result Date: 04/10/2016 CLINICAL DATA:  Screening. EXAM: DIGITAL SCREENING BILATERAL MAMMOGRAM WITH CAD COMPARISON:  Previous exam(s). ACR Breast Density Category b: There are scattered areas of fibroglandular density. FINDINGS: There are no findings suspicious for malignancy. Images were processed with CAD. IMPRESSION: No mammographic evidence of malignancy. A result letter of this screening mammogram will be mailed directly to the patient. RECOMMENDATION:  Screening mammogram in one year. (Code:SM-B-01Y) BI-RADS CATEGORY  1: Negative. Electronically Signed   By: Claudie Revering M.D.   On: 04/10/2016 09:09     Assessment & Plan:  Plan  I am having Ms. Lichtenwalner start on varenicline and omeprazole. I am also having her maintain her ferrous sulfate, Omega-3 Fatty Acids (FISH OIL PO), gabapentin, oxyCODONE-acetaminophen, traMADol, metoprolol succinate, losartan, pravastatin, levothyroxine, and PARoxetine.  Meds ordered this encounter  Medications  . varenicline (CHANTIX STARTING MONTH PAK) 0.5 MG X 11 & 1 MG X 42 tablet    Sig: Take one 0.5 mg tablet by mouth once daily for 3 days, then increase to one 0.5 mg tablet twice daily for 4 days, then increase to one 1 mg tablet twice daily.    Dispense:  53 tablet    Refill:  0  . omeprazole (PRILOSEC) 40 MG capsule    Sig: Take 1 capsule (40 mg total) by mouth daily.    Dispense:  30 capsule    Refill:  3  . PARoxetine (PAXIL) 30 MG tablet  Sig: Take 1 tablet (30 mg total) by mouth daily.    Dispense:  90 tablet    Refill:  1    D/C PREVIOUS SCRIPTS FOR THIS MEDICATION    Problem List Items Addressed This Visit      Unprioritized   GERD   Relevant Medications   omeprazole (PRILOSEC) 40 MG capsule   Other Relevant Orders   POCT urinalysis dipstick (Completed)    Other Visit Diagnoses    RUQ pain    -  Primary   Relevant Orders   US Abdomen Limited RUQ   CBC with Differential/Platelet (Completed)   H. pylori antibody, IgG (Completed)   Comprehensive metabolic panel (Completed)   Amylase (Completed)   Lipase (Completed)   POCT urinalysis dipstick (Completed)   Smoking greater than 40 pack years       Relevant Medications   varenicline (CHANTIX STARTING MONTH PAK) 0.5 MG X 11 & 1 MG X 42 tablet   Other Relevant Orders   Ambulatory Referral for Lung Cancer Scre   POCT urinalysis dipstick (Completed)   Generalized anxiety disorder       Relevant Medications   PARoxetine (PAXIL) 30 MG  tablet      Follow-up: Return in about 2 weeks (around 09/10/2016).  Ann Held, DO

## 2016-08-29 MED ORDER — GI COCKTAIL ~~LOC~~
30.0000 mL | Freq: Once | ORAL | Status: AC
  Administered 2016-08-29: 30 mL via ORAL

## 2016-08-29 NOTE — Addendum Note (Signed)
Addended by: Marjory Lies on: 08/29/2016 04:13 PM   Modules accepted: Orders

## 2016-09-03 ENCOUNTER — Ambulatory Visit (HOSPITAL_BASED_OUTPATIENT_CLINIC_OR_DEPARTMENT_OTHER)
Admission: RE | Admit: 2016-09-03 | Discharge: 2016-09-03 | Disposition: A | Discharge status: Home / Self Care | Payer: BLUE CROSS/BLUE SHIELD | Source: Ambulatory Visit | Attending: Family Medicine | Admitting: Family Medicine

## 2016-09-03 DIAGNOSIS — R1011 Right upper quadrant pain: Secondary | ICD-10-CM

## 2016-09-10 ENCOUNTER — Encounter: Payer: Self-pay | Admitting: Family Medicine

## 2016-09-10 ENCOUNTER — Ambulatory Visit (INDEPENDENT_AMBULATORY_CARE_PROVIDER_SITE_OTHER): Payer: BLUE CROSS/BLUE SHIELD | Admitting: Family Medicine

## 2016-09-10 VITALS — BP 158/84 | HR 70 | Temp 98.2°F | Ht 64.0 in | Wt 154.6 lb

## 2016-09-10 DIAGNOSIS — R42 Dizziness and giddiness: Secondary | ICD-10-CM

## 2016-09-10 DIAGNOSIS — F4320 Adjustment disorder, unspecified: Secondary | ICD-10-CM

## 2016-09-10 DIAGNOSIS — F4321 Adjustment disorder with depressed mood: Secondary | ICD-10-CM

## 2016-09-10 DIAGNOSIS — E039 Hypothyroidism, unspecified: Secondary | ICD-10-CM

## 2016-09-10 DIAGNOSIS — I10 Essential (primary) hypertension: Secondary | ICD-10-CM

## 2016-09-10 DIAGNOSIS — E785 Hyperlipidemia, unspecified: Secondary | ICD-10-CM

## 2016-09-10 LAB — CBC WITH DIFFERENTIAL/PLATELET
BASOS PCT: 0.3 % (ref 0.0–3.0)
Basophils Absolute: 0 10*3/uL (ref 0.0–0.1)
EOS PCT: 2 % (ref 0.0–5.0)
Eosinophils Absolute: 0.1 10*3/uL (ref 0.0–0.7)
HCT: 35.7 % — ABNORMAL LOW (ref 36.0–46.0)
Hemoglobin: 11.9 g/dL — ABNORMAL LOW (ref 12.0–15.0)
LYMPHS ABS: 1.6 10*3/uL (ref 0.7–4.0)
Lymphocytes Relative: 29.7 % (ref 12.0–46.0)
MCHC: 33.2 g/dL (ref 30.0–36.0)
MCV: 83.3 fl (ref 78.0–100.0)
MONOS PCT: 8.9 % (ref 3.0–12.0)
Monocytes Absolute: 0.5 10*3/uL (ref 0.1–1.0)
NEUTROS ABS: 3.3 10*3/uL (ref 1.4–7.7)
NEUTROS PCT: 59.1 % (ref 43.0–77.0)
PLATELETS: 260 10*3/uL (ref 150.0–400.0)
RBC: 4.29 Mil/uL (ref 3.87–5.11)
RDW: 14 % (ref 11.5–15.5)
WBC: 5.6 10*3/uL (ref 4.0–10.5)

## 2016-09-10 LAB — COMPREHENSIVE METABOLIC PANEL
ALT: 11 U/L (ref 0–35)
AST: 17 U/L (ref 0–37)
Albumin: 4.1 g/dL (ref 3.5–5.2)
Alkaline Phosphatase: 80 U/L (ref 39–117)
BUN: 10 mg/dL (ref 6–23)
CO2: 27 meq/L (ref 19–32)
Calcium: 9.7 mg/dL (ref 8.4–10.5)
Chloride: 107 mEq/L (ref 96–112)
Creatinine, Ser: 0.67 mg/dL (ref 0.40–1.20)
GFR: 115.03 mL/min (ref >60.00)
GLUCOSE: 82 mg/dL (ref 70–99)
POTASSIUM: 4.1 meq/L (ref 3.5–5.1)
Sodium: 140 mEq/L (ref 135–145)
Total Bilirubin: 0.7 mg/dL (ref 0.2–1.2)
Total Protein: 7.6 g/dL (ref 6.0–8.3)

## 2016-09-10 LAB — LIPID PANEL
Cholesterol: 184 mg/dL (ref 0–200)
HDL: 57.9 mg/dL (ref >39.00)
LDL Cholesterol: 116 mg/dL — ABNORMAL HIGH (ref 0–99)
NONHDL: 125.61
Total CHOL/HDL Ratio: 3
Triglycerides: 50 mg/dL (ref 0.0–149.0)
VLDL: 10 mg/dL (ref 0.0–40.0)

## 2016-09-10 LAB — TSH: TSH: 1.88 u[IU]/mL (ref 0.35–4.50)

## 2016-09-10 LAB — VITAMIN B12: Vitamin B-12: 334 pg/mL (ref 211–911)

## 2016-09-10 MED ORDER — ALPRAZOLAM 0.25 MG PO TABS: 0.2500 mg | ORAL_TABLET | Freq: Two times a day (BID) | ORAL | 0 refills | Status: DC | PRN

## 2016-09-10 NOTE — Progress Notes (Signed)
Pre visit review using our clinic review tool, if applicable. No additional management support is needed unless otherwise documented below in the visit note. 

## 2016-09-10 NOTE — Progress Notes (Signed)
Patient ID: Debra [name], female    DOB: 31-May-1956  Age: 60 y.o. MRN: [id number]    Subjective:  Subjective  HPI AVONELL DOLAN presents for 6 month f/u but her father passed away suddenly yesterday.  Pt is very upset, crying.  No other complaints.    Review of Systems  Constitutional: Negative for activity change, appetite change, fatigue and unexpected weight change.  Respiratory: Negative for cough and shortness of breath.   Cardiovascular: Negative for chest pain and palpitations.  Psychiatric/Behavioral: Positive for dysphoric mood and sleep disturbance. Negative for behavioral problems. The patient is nervous/anxious.     History Past Medical History:  Diagnosis Date  . Anxiety   . DDD (degenerative disc disease)   . GERD (gastroesophageal reflux disease)   . 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 Thyroid disease    Hypothyroidism    She has a past surgical history that includes Carpal tunnel release; Leg Surgery; Cervical spine surgery; and Wrist fracture surgery (10-02-15).   Her family history includes Arthritis in her mother; Breast cancer in her mother; Cancer in her mother; Colon cancer in her maternal grandfather; Diabetes in her sister; Heart disease in her father; Heart disease (age of onset: 13) in her mother; Hyperlipidemia in her sister; Hypertension in her father and sister; Stroke in her father.She reports that she has been smoking Cigarettes.  She has a 20.00 pack-year smoking history. She does not have any smokeless tobacco history on file. She reports that she drinks alcohol. She reports that she does not use drugs.  Current Outpatient Prescriptions on File Prior to Visit  Medication Sig Dispense Refill  . ferrous sulfate (SLOW FE) 160 (50 FE) MG TBCR SR tablet Take 1 tablet (160 mg total) by mouth daily. 90 each 3  . gabapentin (NEURONTIN) 600 MG tablet Take 1 tablet (600 mg total) by mouth 3 (three) times daily. 90 tablet 5  .  levothyroxine (SYNTHROID, LEVOTHROID) 50 MCG tablet TAKE ONE TABLET BY MOUTH ONCE DAILY 30 tablet 5  . losartan (COZAAR) 100 MG tablet Take 1 tablet (100 mg total) by mouth daily. 90 tablet 3  . metoprolol succinate (TOPROL-XL) 50 MG 24 hr tablet TAKE ONE TABLET BY MOUTH TWICE DAILY 60 tablet 5  . Omega-3 Fatty Acids (FISH OIL PO) Take 1 tablet by mouth as needed. Reported on 03/12/2016    . omeprazole (PRILOSEC) 40 MG capsule Take 1 capsule (40 mg total) by mouth daily. 30 capsule 3  . oxyCODONE-acetaminophen (PERCOCET) 7.5-325 MG tablet Take 1 tablet by mouth every 4 (four) hours as needed for severe pain. Reported on 05/14/2016    . PARoxetine (PAXIL) 30 MG tablet Take 1 tablet (30 mg total) by mouth daily. 90 tablet 1  . pravastatin (PRAVACHOL) 20 MG tablet Take 1 tablet (20 mg total) by mouth every evening. 30 tablet 2  . traMADol (ULTRAM) 50 MG tablet Take 50 mg by mouth every 6 (six) hours as needed.    . varenicline (CHANTIX STARTING MONTH PAK) 0.5 MG X 11 & 1 MG X 42 tablet Take one 0.5 mg tablet by mouth once daily for 3 days, then increase to one 0.5 mg tablet twice daily for 4 days, then increase to one 1 mg tablet twice daily. 53 tablet 0   No current facility-administered medications on file prior to visit.      Objective:  Objective  Physical Exam  Constitutional: She is oriented to person, place, and  time. She appears well-developed and well-nourished.  HENT:  Head: Normocephalic and atraumatic.  Eyes: Conjunctivae and EOM are normal.  Neck: Normal range of motion. Neck supple. No JVD present. Carotid bruit is not present. No thyromegaly present.  Cardiovascular: Normal rate, regular rhythm and normal heart sounds.   No murmur heard. Pulmonary/Chest: Effort normal and breath sounds normal. No respiratory distress. She has no wheezes. She has no rales. She exhibits no tenderness.  Musculoskeletal: She exhibits no edema.  Neurological: She is alert and oriented to person, place,  and time.  Psychiatric: Her speech is normal and behavior is normal. Judgment and thought content normal. Her mood appears anxious. Cognition and memory are normal. She exhibits a depressed mood.  Pt crying uncontrollably in exam room   Nursing note and vitals reviewed.  BP (!) 158/84   Pulse 70   Temp 98.2 F (36.8 C) (Oral)   Ht 5\' 4"  (1.626 m)   Wt 154 lb 9.6 oz (70.1 kg)   SpO2 100%   BMI 26.54 kg/m  Wt Readings from Last 3 Encounters:  09/10/16 154 lb 9.6 oz (70.1 kg)  08/27/16 155 lb 3.2 oz (70.4 kg)  05/14/16 163 lb (73.9 kg)     Lab Results  Component Value Date   WBC 5.6 09/10/2016   HGB 11.9 (L) 09/10/2016   HCT 35.7 (L) 09/10/2016   PLT 260.0 09/10/2016   GLUCOSE 82 09/10/2016   CHOL 184 09/10/2016   TRIG 50.0 09/10/2016   HDL 57.90 09/10/2016   LDLDIRECT 126.3 06/06/2011   LDLCALC 116 (H) 09/10/2016   ALT 11 09/10/2016   AST 17 09/10/2016   NA 140 09/10/2016   K 4.1 09/10/2016   CL 107 09/10/2016   CREATININE 0.67 09/10/2016   BUN 10 09/10/2016   CO2 27 09/10/2016   TSH 1.88 09/10/2016   HGBA1C 6.3 03/12/2016   MICROALBUR 3.1 (H) 11/07/2014    US Abdomen Limited Ruq  Result Date: 09/03/2016 CLINICAL DATA:  Right upper quadrant pain for the last several months. EXAM: US ABDOMEN LIMITED - RIGHT UPPER QUADRANT COMPARISON:  05/08/2007 FINDINGS: Gallbladder: No gallstones or wall thickening visualized. No sonographic Murphy sign noted by sonographer. Common bile duct: Diameter: 2.1 mm, normal Liver: No focal lesion identified. Within normal limits in parenchymal echogenicity. IMPRESSION: Normal right upper quadrant ultrasound. No abnormality seen to explain pain. Electronically Signed   By: Trager Chimes M.D.   On: 09/03/2016 15:30     Assessment & Plan:  Plan  I am having Ms. Sar start on ALPRAZolam. I am also having her maintain her ferrous sulfate, Omega-3 Fatty Acids (FISH OIL PO), gabapentin, oxyCODONE-acetaminophen, traMADol, metoprolol succinate,  losartan, pravastatin, levothyroxine, varenicline, omeprazole, and PARoxetine.  Meds ordered this encounter  Medications  . ALPRAZolam (XANAX) 0.25 MG tablet    Sig: Take 1 tablet (0.25 mg total) by mouth 2 (two) times daily as needed for anxiety.    Dispense:  20 tablet    Refill:  0    Problem List Items Addressed This Visit      Unprioritized   Essential hypertension   Relevant Orders   Comprehensive metabolic panel (Completed)   CBC with Differential/Platelet (Completed)   Lipid panel (Completed)    Other Visit Diagnoses    Grief reaction    -  Primary   Relevant Medications   ALPRAZolam (XANAX) 0.25 MG tablet   Hyperlipidemia LDL goal <100       Relevant Orders   Comprehensive metabolic panel (  Completed)   CBC with Differential/Platelet (Completed)   Lipid panel (Completed)   Hypothyroidism, unspecified type       Relevant Orders   TSH (Completed)   Dizzy       Relevant Orders   Vitamin B12 (Completed)      Follow-up: Return in about 6 months (around 03/10/2017) for annual exam, fasting.  Ann Held, DO

## 2016-09-10 NOTE — Progress Notes (Signed)
Pre visit review using our clinic tool,if applicable. No additional management support is needed unless otherwise documented below in the visit note.  

## 2016-09-10 NOTE — Patient Instructions (Signed)
Complicated Grieving Introduction Grief is a normal response to the death of someone close to you. Feelings of fear, anger, and guilt can affect almost everyone who loses a loved one. It is also common to have symptoms of depression while you are grieving. These include problems with sleep, loss of appetite, and lack of energy. They may last for weeks or months after a loss. Complicated grief is different from normal grief or depression. Normal grieving involves sadness and feelings of loss, but these feelings are not constant. Complicated grief is a constant and severe type of grief. It interferes with your ability to function normally. It may last for several months to a year or longer. Complicated grief may require treatment from a mental health care provider. What are the causes? It is not known why some people continue to struggle with grief and others do not. You may be at higher risk for complicated grief if:  The death of your loved one was sudden or unexpected.  The death of your loved one was due to a violent event.  Your loved one committed suicide.  Your loved one was a child or a young person.  You were very close to or dependent on the loved one.  You have a history of depression. What are the signs or symptoms? Signs and symptoms of complicated grief may include:  Feeling disbelief or numbness.  Being unable to enjoy good memories of your loved one.  Needing to avoid anything that reminds you of your loved one.  Being unable to stop thinking about the death.  Feeling intense anger or guilt.  Feeling alone and hopeless.  Feeling that your life is meaningless and empty.  Losing the desire to live. How is this diagnosed? Your health care provider may diagnose complicated grief if:  You have constant symptoms of grief for 6-12 months or longer.  Your symptoms are interfering with your ability to live your life. Your health care provider may want you to see a  mental health care provider. Many symptoms of depression are similar to the symptoms of complicated grief. It is important to be evaluated for complicated grief along with other mental health conditions. How is this treated? Talk therapy with a mental health provider is the most common treatment for complicated grief. During therapy, you will learn healthy ways to cope with the loss of your loved one. In some cases, your mental health care provider may also recommend antidepressant medicines. Follow these instructions at home:  Take care of yourself.  Eat regular meals and maintain a healthy diet. Eat plenty of fruits, vegetables, and whole grains.  Try to get some exercise each day.  Keep regular hours for sleep. Try to get at least 8 hours of sleep each night.  Do not use drugs or alcohol to ease your symptoms.  Take medicines only as directed by your health care provider.  Spend time with friends and loved ones.  Consider joining a grief (bereavement) support group to help you deal with your loss.  Keep all follow-up visits as directed by your health care provider. This is important. Contact a health care provider if:  Your symptoms keep you from functioning normally.  Your symptoms do not get better with treatment. Get help right away if:  You have serious thoughts of hurting yourself or someone else.  You have suicidal feelings. This information is not intended to replace advice given to you by your health care provider. Make sure you discuss any   questions you have with your health care provider. Document Released: 10/14/2005 Document Revised: 03/21/2016 Document Reviewed: 03/24/2014  2017 Elsevier  

## 2016-09-12 ENCOUNTER — Encounter: Payer: Self-pay | Admitting: Family Medicine

## 2016-09-14 ENCOUNTER — Other Ambulatory Visit: Payer: Self-pay | Admitting: Family Medicine

## 2016-09-16 NOTE — Telephone Encounter (Signed)
Refill sent per [facility] refill protocol/SLS  

## 2016-09-30 ENCOUNTER — Ambulatory Visit: Payer: BLUE CROSS/BLUE SHIELD | Admitting: Pulmonary Disease

## 2016-10-16 ENCOUNTER — Other Ambulatory Visit: Payer: Self-pay | Admitting: Family Medicine

## 2016-10-16 DIAGNOSIS — G8929 Other chronic pain: Secondary | ICD-10-CM

## 2016-12-06 ENCOUNTER — Telehealth: Payer: Self-pay | Admitting: Family Medicine

## 2016-12-06 DIAGNOSIS — F17210 Nicotine dependence, cigarettes, uncomplicated: Secondary | ICD-10-CM

## 2016-12-06 NOTE — Telephone Encounter (Signed)
Patient   Would like to discuss medication, patient didn't elaborate please advise

## 2016-12-10 MED ORDER — VARENICLINE TARTRATE 0.5 MG X 11 & 1 MG X 42 PO MISC: ORAL | 0 refills | Status: DC

## 2016-12-10 NOTE — Addendum Note (Signed)
Addended by: Sharon Seller B on: 12/10/2016 06:29 PM   Modules accepted: Orders

## 2016-12-10 NOTE — Telephone Encounter (Signed)
Printed and faxed to [facility]

## 2016-12-10 NOTE — Telephone Encounter (Signed)
Ok to send chantix starter pack

## 2016-12-10 NOTE — Telephone Encounter (Signed)
Spoke to the patient and she would like a prescription for chantix sent in to [facility].

## 2017-02-27 ENCOUNTER — Telehealth: Payer: Self-pay | Admitting: Family Medicine

## 2017-02-27 DIAGNOSIS — E2839 Other primary ovarian failure: Secondary | ICD-10-CM

## 2017-02-27 NOTE — Telephone Encounter (Signed)
Latika from [facility] called in in regards to bone density order in epic. She says that pt isn't scheduled but by the time that she is due the order would be old. She is just wanting someone to place a new order into the system.   No call back is needed.   CB: [phone]

## 2017-02-28 NOTE — Telephone Encounter (Signed)
Order for bone density redone.

## 2017-03-06 ENCOUNTER — Telehealth: Payer: Self-pay | Admitting: Family Medicine

## 2017-03-06 NOTE — Telephone Encounter (Signed)
Patient Name: Debra [name]  DOB: 02-03-1956    Initial Comment Caller is having numbness in arms and legs.    Nurse Assessment  Nurse: Christel Mormon, RN, Levada Dy Date/Time Eilene Ghazi Time): 03/06/2017 5:04:43 PM  Confirm and document reason for call. If symptomatic, describe symptoms. ---Caller is able to walk and currently driving. She was having a hard time yesterday. She states she has some "disk's" that need to be worked on. Yesterday, she was trying to move things and clean and may have done too much. She states she started feeling numbness in her arms and legs. She had it again a few minutes ago which reminded her to call. She is not having any at this moment. She does admit to some back pain right now.  Does the patient have any new or worsening symptoms? ---Yes  Will a triage be completed? ---Yes  Related visit to physician within the last 2 weeks? ---No  Does the PT have any chronic conditions? (i.e. diabetes, asthma, etc.) ---Yes  List chronic conditions. ---chronic back pain, HTN, osteoarthritis, hypothyroidism, GERD  Is this a behavioral health or substance abuse call? ---No     Guidelines    Guideline Title Affirmed Question Affirmed Notes  Back Pain [1] MODERATE back pain (e.g., interferes with normal activities) AND [2] present > 3 days    Final Disposition User   See PCP When Office is Open (within 3 days) [place], Therapist, sports, Levada Dy    Comments  Attempted appt- no appts showing. The office has already closed. Instructed pt to call in am to get an appt.   Referrals  REFERRED TO PCP OFFICE   Disagree/Comply: Comply

## 2017-03-07 ENCOUNTER — Other Ambulatory Visit: Payer: Self-pay | Admitting: Family Medicine

## 2017-03-07 ENCOUNTER — Ambulatory Visit (INDEPENDENT_AMBULATORY_CARE_PROVIDER_SITE_OTHER): Payer: BLUE CROSS/BLUE SHIELD | Admitting: Internal Medicine

## 2017-03-07 ENCOUNTER — Ambulatory Visit (HOSPITAL_BASED_OUTPATIENT_CLINIC_OR_DEPARTMENT_OTHER)
Admission: RE | Admit: 2017-03-07 | Discharge: 2017-03-07 | Disposition: A | Discharge status: Home / Self Care | Payer: BLUE CROSS/BLUE SHIELD | Source: Ambulatory Visit | Attending: Internal Medicine | Admitting: Internal Medicine

## 2017-03-07 ENCOUNTER — Encounter: Payer: Self-pay | Admitting: Internal Medicine

## 2017-03-07 VITALS — BP 132/68 | HR 74 | Temp 97.5°F | Resp 14 | Ht 64.0 in | Wt 147.1 lb

## 2017-03-07 DIAGNOSIS — M546 Pain in thoracic spine: Secondary | ICD-10-CM

## 2017-03-07 DIAGNOSIS — M47896 Other spondylosis, lumbar region: Secondary | ICD-10-CM | POA: Insufficient documentation

## 2017-03-07 DIAGNOSIS — I700 Atherosclerosis of aorta: Secondary | ICD-10-CM | POA: Insufficient documentation

## 2017-03-07 DIAGNOSIS — M549 Dorsalgia, unspecified: Secondary | ICD-10-CM | POA: Diagnosis not present

## 2017-03-07 DIAGNOSIS — M545 Low back pain: Secondary | ICD-10-CM | POA: Diagnosis not present

## 2017-03-07 MED ORDER — CYCLOBENZAPRINE HCL 10 MG PO TABS: 10.0000 mg | ORAL_TABLET | Freq: Every evening | ORAL | 0 refills | Status: DC | PRN

## 2017-03-07 NOTE — Telephone Encounter (Signed)
Appointment scheduled for 03/07/17 at 1:00 PM with Dr. Larose Kells.

## 2017-03-07 NOTE — Progress Notes (Signed)
Pre visit review using our clinic review tool, if applicable. No additional management support is needed unless otherwise documented below in the visit note. 

## 2017-03-07 NOTE — Progress Notes (Signed)
Subjective:    Patient ID: Debra [name], female    DOB: [birth year], 61 y.o.   MRN: [id number]  DOS:  03/07/2017 Type of visit - description : CC pain Interval history: She is trying to move to her father's house so last week she did some work, standing all day trying to organize things, denies heavy lifting. Since then is having more aches and pains. History taking is challenging, symptoms are vague but to the best of my ability she reports the following: Upper and lower back pain, around upper thoracic spine and lumbar spine. Last week she has leg and arm numbness, symptoms are gone. She has chronic neck pain and that has not changed much. Listed in her medicines are oxycodone and Ultram but states that she is not taking that.   Review of Systems  denies fever chills No radiation of the pain to the extremities No major problems with headaches. Gait is at baseline and denies any problem moving her legs.   Past Medical History:  Diagnosis Date  . Anxiety   . DDD (degenerative disc disease)   . GERD (gastroesophageal reflux disease)   . 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 Thyroid disease    Hypothyroidism    Past Surgical History:  Procedure Laterality Date  . CARPAL TUNNEL RELEASE     Bilateral  . CERVICAL SPINE SURGERY     x3  . LEG SURGERY     Right   . WRIST FRACTURE SURGERY  10-02-15   Pt have surgery twice on the same wrist.    Social History   Social History  . Marital status: Legally Separated    Spouse name: N/A  . Number of children: 3  . Years of education: N/A   Occupational History  . 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 Smoking status: Current Every Day Smoker    Packs/day: 1.00    Years: 20.00    Types: Cigarettes  . Smokeless tobacco: Never Used  . Alcohol use 0.0 oz/week     Comment: 2-3 drinks per week (beer/wine)  . Drug use: No  . Sexual activity: Yes    Partners: Male   Birth control/ protection: Post-menopausal   Other Topics Concern  . Not on file   Social History Narrative  . No narrative on file      Allergies as of 03/07/2017      Reactions   Penicillins    Codeine Rash, Other (See Comments)   dizziness      Medication List       Accurate as of 03/07/17 11:59 PM. Always use your most recent med list.          ALPRAZolam 0.25 MG tablet Commonly known as:  XANAX Take 1 tablet (0.25 mg total) by mouth 2 (two) times daily as needed for anxiety.   cyclobenzaprine 10 MG tablet Commonly known as:  FLEXERIL Take 1 tablet (10 mg total) by mouth at bedtime as needed for muscle spasms.   ferrous sulfate 160 (50 Fe) MG Tbcr SR tablet Commonly known as:  SLOW FE Take 1 tablet (160 mg total) by mouth daily.   FISH OIL PO Take 1 tablet by mouth as needed. Reported on 03/12/2016   gabapentin 600 MG tablet Commonly known as:  NEURONTIN TAKE ONE TABLET BY MOUTH THREE TIMES DAILY   levothyroxine 50 MCG tablet Commonly known as:  SYNTHROID, LEVOTHROID TAKE ONE TABLET  BY MOUTH ONCE DAILY   losartan 100 MG tablet Commonly known as:  COZAAR Take 1 tablet (100 mg total) by mouth daily.   metoprolol succinate 50 MG 24 hr tablet Commonly known as:  TOPROL-XL TAKE ONE TABLET BY MOUTH TWICE DAILY   omeprazole 40 MG capsule Commonly known as:  PRILOSEC Take 1 capsule (40 mg total) by mouth daily.   oxyCODONE-acetaminophen 7.5-325 MG tablet Commonly known as:  PERCOCET Take 1 tablet by mouth every 4 (four) hours as needed for severe pain. Reported on 05/14/2016   PARoxetine 30 MG tablet Commonly known as:  PAXIL Take 1 tablet (30 mg total) by mouth daily.   pravastatin 20 MG tablet Commonly known as:  PRAVACHOL TAKE ONE TABLET BY MOUTH IN THE EVENING   traMADol 50 MG tablet Commonly known as:  ULTRAM Take 50 mg by mouth every 6 (six) hours as needed.   varenicline 0.5 MG X 11 & 1 MG X 42 tablet Commonly known as:  CHANTIX STARTING  MONTH PAK Take one 0.5 mg tablet by mouth once daily for 3 days, then increase to one 0.5 mg tablet twice daily for 4 days, then increase to one 1 mg tablet twice daily.          Objective:   Physical Exam  Musculoskeletal:       Arms:  BP 132/68 (BP Location: Right Arm, Patient Position: Sitting, Cuff Size: Small)   Pulse 74   Temp 97.5 F (36.4 C) (Oral)   Resp 14   Ht 5\' 4"  (1.626 m)   Wt 147 lb 2 oz (66.7 kg)   SpO2 97%   BMI 25.25 kg/m  General:   Well developed, well nourished . NAD.  HEENT:  Normocephalic . Face symmetric, atraumatic Neck: Range of motion quite limited, history of neck surgery, reports that is her baseline Lungs:  CTA B Normal respiratory effort, no intercostal retractions, no accessory muscle use. Heart: RRR,  no murmur.  No pretibial edema bilaterally  Skin: Not pale. Not jaundice Neurologic:  alert & oriented X3.  Speech normal, gait appropriate for age and unassisted. DTRs symmetric Psych--  Cognition and judgment appear intact.  Cooperative with normal attention span and concentration.  Behavior appropriate. No anxious or depressed appearing.      Assessment & Plan:    61 year old female with history of thyroid disease, anxiety, DJD, connective tissue disease,   presents with the following: Thoracic and lumbar spine: History taking is challenging, currently with back pain with no radicular symptoms, DTRs and motor symmetric. To be sure we will get x-rays, recommend Flexeril, Tylenol, heating pads, we also talk about labs (sed rate, CBC) but states she needs to go to work and is short in time. Eventually agreed on doing XRs Come back and see PCP next week if not improving. ER if symptoms severe

## 2017-03-07 NOTE — Patient Instructions (Signed)
Gets your x-rays downstairs  Tylenol 500 mg 2 tablets every 8 hours as needed  Heating pad  Flexeril at night  Call if not gradually better in the next few days, no heavy lifting or prolonged standing.

## 2017-04-08 ENCOUNTER — Ambulatory Visit (HOSPITAL_BASED_OUTPATIENT_CLINIC_OR_DEPARTMENT_OTHER)
Admission: RE | Admit: 2017-04-08 | Discharge: 2017-04-08 | Disposition: A | Discharge status: Home / Self Care | Payer: BLUE CROSS/BLUE SHIELD | Source: Ambulatory Visit | Attending: Family Medicine | Admitting: Family Medicine

## 2017-04-08 DIAGNOSIS — M85851 Other specified disorders of bone density and structure, right thigh: Secondary | ICD-10-CM | POA: Insufficient documentation

## 2017-04-08 DIAGNOSIS — E2839 Other primary ovarian failure: Secondary | ICD-10-CM | POA: Insufficient documentation

## 2017-04-09 ENCOUNTER — Other Ambulatory Visit: Payer: Self-pay | Admitting: Family Medicine

## 2017-04-09 DIAGNOSIS — I10 Essential (primary) hypertension: Secondary | ICD-10-CM

## 2017-04-17 ENCOUNTER — Other Ambulatory Visit: Payer: Self-pay | Admitting: Family Medicine

## 2017-04-17 ENCOUNTER — Ambulatory Visit (HOSPITAL_BASED_OUTPATIENT_CLINIC_OR_DEPARTMENT_OTHER)
Admission: RE | Admit: 2017-04-17 | Discharge: 2017-04-17 | Disposition: A | Discharge status: Home / Self Care | Payer: BLUE CROSS/BLUE SHIELD | Source: Ambulatory Visit | Attending: Family Medicine | Admitting: Family Medicine

## 2017-04-17 ENCOUNTER — Ambulatory Visit (INDEPENDENT_AMBULATORY_CARE_PROVIDER_SITE_OTHER): Payer: BLUE CROSS/BLUE SHIELD | Admitting: Family Medicine

## 2017-04-17 VITALS — BP 128/82 | HR 71 | Temp 98.2°F | Resp 16 | Ht 64.0 in | Wt 148.6 lb

## 2017-04-17 DIAGNOSIS — M545 Low back pain, unspecified: Secondary | ICD-10-CM

## 2017-04-17 DIAGNOSIS — R2241 Localized swelling, mass and lump, right lower limb: Secondary | ICD-10-CM

## 2017-04-17 DIAGNOSIS — I10 Essential (primary) hypertension: Secondary | ICD-10-CM

## 2017-04-17 DIAGNOSIS — E785 Hyperlipidemia, unspecified: Secondary | ICD-10-CM

## 2017-04-17 DIAGNOSIS — D509 Iron deficiency anemia, unspecified: Secondary | ICD-10-CM

## 2017-04-17 DIAGNOSIS — F17200 Nicotine dependence, unspecified, uncomplicated: Secondary | ICD-10-CM

## 2017-04-17 DIAGNOSIS — Z23 Encounter for immunization: Secondary | ICD-10-CM | POA: Diagnosis not present

## 2017-04-17 DIAGNOSIS — R296 Repeated falls: Secondary | ICD-10-CM

## 2017-04-17 DIAGNOSIS — F430 Acute stress reaction: Secondary | ICD-10-CM

## 2017-04-17 DIAGNOSIS — M47897 Other spondylosis, lumbosacral region: Secondary | ICD-10-CM | POA: Diagnosis not present

## 2017-04-17 DIAGNOSIS — I700 Atherosclerosis of aorta: Secondary | ICD-10-CM | POA: Insufficient documentation

## 2017-04-17 DIAGNOSIS — F17210 Nicotine dependence, cigarettes, uncomplicated: Secondary | ICD-10-CM

## 2017-04-17 HISTORY — DX: Iron deficiency anemia, unspecified: D50.9

## 2017-04-17 HISTORY — DX: Hyperlipidemia, unspecified: E78.5

## 2017-04-17 LAB — COMPREHENSIVE METABOLIC PANEL
ALBUMIN: 4.2 g/dL (ref 3.5–5.2)
ALT: 11 U/L (ref 0–35)
AST: 20 U/L (ref 0–37)
Alkaline Phosphatase: 70 U/L (ref 39–117)
BUN: 14 mg/dL (ref 6–23)
CALCIUM: 9.8 mg/dL (ref 8.4–10.5)
CHLORIDE: 105 meq/L (ref 96–112)
CO2: 30 mEq/L (ref 19–32)
CREATININE: 0.72 mg/dL (ref 0.40–1.20)
GFR: 105.65 mL/min (ref >60.00)
Glucose, Bld: 97 mg/dL (ref 70–99)
POTASSIUM: 4 meq/L (ref 3.5–5.1)
Sodium: 138 mEq/L (ref 135–145)
Total Bilirubin: 0.5 mg/dL (ref 0.2–1.2)
Total Protein: 7.5 g/dL (ref 6.0–8.3)

## 2017-04-17 LAB — LIPID PANEL
CHOLESTEROL: 180 mg/dL (ref 0–200)
HDL: 54.5 mg/dL (ref >39.00)
LDL Cholesterol: 109 mg/dL — ABNORMAL HIGH (ref 0–99)
NonHDL: 125.16
TRIGLYCERIDES: 81 mg/dL (ref 0.0–149.0)
Total CHOL/HDL Ratio: 3
VLDL: 16.2 mg/dL (ref 0.0–40.0)

## 2017-04-17 LAB — CBC WITH DIFFERENTIAL/PLATELET
BASOS PCT: 0.3 % (ref 0.0–3.0)
Basophils Absolute: 0 10*3/uL (ref 0.0–0.1)
EOS ABS: 0.1 10*3/uL (ref 0.0–0.7)
Eosinophils Relative: 2 % (ref 0.0–5.0)
HEMATOCRIT: 35.7 % — AB (ref 36.0–46.0)
Hemoglobin: 11.8 g/dL — ABNORMAL LOW (ref 12.0–15.0)
LYMPHS ABS: 2.2 10*3/uL (ref 0.7–4.0)
LYMPHS PCT: 34.2 % (ref 12.0–46.0)
MCHC: 33 g/dL (ref 30.0–36.0)
MCV: 84.1 fl (ref 78.0–100.0)
Monocytes Absolute: 0.6 10*3/uL (ref 0.1–1.0)
Monocytes Relative: 9.4 % (ref 3.0–12.0)
NEUTROS ABS: 3.5 10*3/uL (ref 1.4–7.7)
Neutrophils Relative %: 54.1 % (ref 43.0–77.0)
PLATELETS: 256 10*3/uL (ref 150.0–400.0)
RBC: 4.25 Mil/uL (ref 3.87–5.11)
RDW: 13.5 % (ref 11.5–15.5)
WBC: 6.4 10*3/uL (ref 4.0–10.5)

## 2017-04-17 MED ORDER — VARENICLINE TARTRATE 0.5 MG X 11 & 1 MG X 42 PO MISC: ORAL | 0 refills | Status: DC

## 2017-04-17 MED ORDER — PRAVASTATIN SODIUM 20 MG PO TABS: 20.0000 mg | ORAL_TABLET | Freq: Every evening | ORAL | 1 refills | Status: DC

## 2017-04-17 MED ORDER — METOPROLOL SUCCINATE ER 50 MG PO TB24: ORAL_TABLET | ORAL | 5 refills | Status: DC

## 2017-04-17 NOTE — Assessment & Plan Note (Signed)
Tolerating statin, encouraged heart healthy diet, avoid trans fats, minimize simple carbs and saturated fats. Increase exercise as tolerated 

## 2017-04-17 NOTE — Assessment & Plan Note (Signed)
Well controlled, no changes to meds. Encouraged heart healthy diet such as the DASH diet and exercise as tolerated.  °

## 2017-04-17 NOTE — Progress Notes (Signed)
Patient ID: Debra [name], female   DOB: 1956/09/28, 61 y.o.   MRN: [id number]    Subjective:  I acted as a Education administrator for Dr. Carollee Herter.  [name], [place]   Patient ID: LETTICIA [name], female    DOB: 03/10/1956, 61 y.o.   MRN: [id number]  Chief Complaint  Patient presents with  . Hyperlipidemia  . Hypertension  . Back Pain    HPI  Patient is in today for knot on right foot.  She noticed it about 2-3 weeks ago.  She has a mole on her left upper arm that she is concerned bout.  It has been there for a while. She also needs f/u htn, and  cholesterol  She also c/o low back pain-- she slid down steel steps -- 2-3 weeks ago---- pain is in low back -- no radiation of pain She also c/o know on bottom of R foot.  0---- x 3 weeks, not painful but she is concerned about it.    She also is concerned about spot on L arm x years --- no change but she is concerned about it Patient Care Team: Carollee Herter, Alferd Apa, DO as PCP - General Leeroy Cha, MD as Consulting Physician (Neurosurgery) Chesley Mires, MD as Consulting Physician (Pulmonary Disease)   Past Medical History:  Diagnosis Date  . Anxiety   . DDD (degenerative disc disease)   . GERD (gastroesophageal reflux disease)   . 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 Thyroid disease    Hypothyroidism    Past Surgical History:  Procedure Laterality Date  . CARPAL TUNNEL RELEASE     Bilateral  . CERVICAL SPINE SURGERY     x3  . LEG SURGERY     Right   . WRIST FRACTURE SURGERY  10-02-15   Pt have surgery twice on the same wrist.    Family History  Problem Relation Age of Onset  . Breast cancer Mother   . Arthritis Mother        rheumatoid  . Cancer Mother        breast  . Heart disease Mother 12       MI  . Heart disease Father        MI  . Hypertension Father   . Stroke Father   . Diabetes Sister   . Hypertension Sister   . Hyperlipidemia Sister   . Colon cancer Maternal Grandfather     Social  History   Social History  . Marital status: Legally Separated    Spouse name: N/A  . Number of children: 3  . Years of education: N/A   Occupational History  . 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 Smoking status: Current Every Day Smoker    Packs/day: 1.00    Years: 20.00    Types: Cigarettes  . Smokeless tobacco: Never Used  . Alcohol use 0.0 oz/week     Comment: 2-3 drinks per week (beer/wine)  . Drug use: No  . Sexual activity: Yes    Partners: Male    Birth control/ protection: Post-menopausal   Other Topics Concern  . Not on file   Social History Narrative  . No narrative on file    Outpatient Medications Prior to Visit  Medication Sig Dispense Refill  . ALPRAZolam (XANAX) 0.25 MG tablet Take 1 tablet (0.25 mg total) by mouth 2 (two) times daily as needed for anxiety. 20 tablet 0  .  cyclobenzaprine (FLEXERIL) 10 MG tablet Take 1 tablet (10 mg total) by mouth at bedtime as needed for muscle spasms. 21 tablet 0  . ferrous sulfate (SLOW FE) 160 (50 FE) MG TBCR SR tablet Take 1 tablet (160 mg total) by mouth daily. 90 each 3  . gabapentin (NEURONTIN) 600 MG tablet TAKE ONE TABLET BY MOUTH THREE TIMES DAILY 90 tablet 5  . levothyroxine (SYNTHROID, LEVOTHROID) 50 MCG tablet TAKE ONE TABLET BY MOUTH ONCE DAILY 87 tablet 0  . losartan (COZAAR) 100 MG tablet Take 1 tablet (100 mg total) by mouth daily. 90 tablet 3  . Omega-3 Fatty Acids (FISH OIL PO) Take 1 tablet by mouth as needed. Reported on 03/12/2016    . omeprazole (PRILOSEC) 40 MG capsule Take 1 capsule (40 mg total) by mouth daily. 30 capsule 3  . oxyCODONE-acetaminophen (PERCOCET) 7.5-325 MG tablet Take 1 tablet by mouth every 4 (four) hours as needed for severe pain. Reported on 05/14/2016    . PARoxetine (PAXIL) 30 MG tablet Take 1 tablet (30 mg total) by mouth daily. 90 tablet 1  . traMADol (ULTRAM) 50 MG tablet Take 50 mg by mouth every 6 (six) hours as needed.    . metoprolol  succinate (TOPROL-XL) 50 MG 24 hr tablet TAKE ONE TABLET BY MOUTH TWICE DAILY 60 tablet 5  . pravastatin (PRAVACHOL) 20 MG tablet TAKE ONE TABLET BY MOUTH IN THE EVENING 90 tablet 0  . varenicline (CHANTIX STARTING MONTH PAK) 0.5 MG X 11 & 1 MG X 42 tablet Take one 0.5 mg tablet by mouth once daily for 3 days, then increase to one 0.5 mg tablet twice daily for 4 days, then increase to one 1 mg tablet twice daily. 53 tablet 0   No facility-administered medications prior to visit.     Allergies  Allergen Reactions  . Penicillins   . Codeine Rash and Other (See Comments)    dizziness    Review of Systems  Constitutional: Negative for fever and malaise/fatigue.  HENT: Negative for congestion.   Eyes: Negative for blurred vision.  Respiratory: Negative for cough and shortness of breath.   Cardiovascular: Negative for chest pain, palpitations and leg swelling.  Gastrointestinal: Negative for vomiting.  Musculoskeletal: Positive for back pain, falls and joint pain.  Skin: Negative for rash.       Knot on right foot and mole on left arm  Neurological: Negative for loss of consciousness and headaches.  Psychiatric/Behavioral: Negative for depression. The patient is nervous/anxious.        Objective:    Physical Exam  Musculoskeletal:       Feet:  Neurological: She is alert. She has normal strength. She displays no atrophy. She exhibits normal muscle tone. Coordination and gait normal.  Skin:     Nursing note and vitals reviewed.   There were no vitals taken for this visit. Wt Readings from Last 3 Encounters:  03/07/17 147 lb 2 oz (66.7 kg)  09/10/16 154 lb 9.6 oz (70.1 kg)  08/27/16 155 lb 3.2 oz (70.4 kg)   BP Readings from Last 3 Encounters:  03/07/17 132/68  09/10/16 (!) 158/84  08/27/16 132/88     Immunization History  Administered Date(s) Administered  . Influenza Whole 10/01/2007, 08/31/2010  . Influenza,inj,Quad PF,36+ Mos 10/12/2013, 08/29/2015, 08/13/2016  .  Td 11/02/2010  . Zoster 03/12/2016  . Zoster Recombinat (Shingrix) 04/17/2017    Health Maintenance  Topic Date Due  . INFLUENZA VACCINE  05/28/2017  . PAP SMEAR  02/20/2018  . MAMMOGRAM  04/09/2018  . DEXA SCAN  04/09/2019  . COLONOSCOPY  07/28/2019  . TETANUS/TDAP  11/02/2020  . Hepatitis C Screening  Completed  . HIV Screening  Completed    Lab Results  Component Value Date   WBC 6.4 04/17/2017   HGB 11.8 (L) 04/17/2017   HCT 35.7 (L) 04/17/2017   PLT 256.0 04/17/2017   GLUCOSE 97 04/17/2017   CHOL 180 04/17/2017   TRIG 81.0 04/17/2017   HDL 54.50 04/17/2017   LDLDIRECT 126.3 06/06/2011   LDLCALC 109 (H) 04/17/2017   ALT 11 04/17/2017   AST 20 04/17/2017   NA 138 04/17/2017   K 4.0 04/17/2017   CL 105 04/17/2017   CREATININE 0.72 04/17/2017   BUN 14 04/17/2017   CO2 30 04/17/2017   TSH 1.88 09/10/2016   HGBA1C 6.3 03/12/2016   MICROALBUR 3.1 (H) 11/07/2014    Lab Results  Component Value Date   TSH 1.88 09/10/2016   Lab Results  Component Value Date   WBC 6.4 04/17/2017   HGB 11.8 (L) 04/17/2017   HCT 35.7 (L) 04/17/2017   MCV 84.1 04/17/2017   PLT 256.0 04/17/2017   Lab Results  Component Value Date   NA 138 04/17/2017   K 4.0 04/17/2017   CO2 30 04/17/2017   GLUCOSE 97 04/17/2017   BUN 14 04/17/2017   CREATININE 0.72 04/17/2017   BILITOT 0.5 04/17/2017   ALKPHOS 70 04/17/2017   AST 20 04/17/2017   ALT 11 04/17/2017   PROT 7.5 04/17/2017   ALBUMIN 4.2 04/17/2017   CALCIUM 9.8 04/17/2017   ANIONGAP 11 08/31/2014   GFR 105.65 04/17/2017   Lab Results  Component Value Date   CHOL 180 04/17/2017   Lab Results  Component Value Date   HDL 54.50 04/17/2017   Lab Results  Component Value Date   LDLCALC 109 (H) 04/17/2017   Lab Results  Component Value Date   TRIG 81.0 04/17/2017   Lab Results  Component Value Date   CHOLHDL 3 04/17/2017   Lab Results  Component Value Date   HGBA1C 6.3 03/12/2016         Assessment & Plan:     Problem List Items Addressed This Visit      Unprioritized   TOBACCO USER   Relevant Orders   Ambulatory Referral for Lung Cancer Scre   Anemia, iron deficiency   Essential hypertension - Primary    Well controlled, no changes to meds. Encouraged heart healthy diet such as the DASH diet and exercise as tolerated.       Relevant Orders   Comprehensive metabolic panel (Completed)   Lipid panel (Completed)   CBC with Differential/Platelet (Completed)   Hyperlipidemia    Tolerating statin, encouraged heart healthy diet, avoid trans fats, minimize simple carbs and saturated fats. Increase exercise as tolerated      Relevant Orders   Comprehensive metabolic panel (Completed)   Lipid panel (Completed)   CBC with Differential/Platelet (Completed)   Low back pain   Relevant Orders   DG Lumbar Spine Complete (Completed)    Other Visit Diagnoses    Foot mass, right       Relevant Orders   Ambulatory referral to Podiatry   Frequent falls       Stress reaction       Relevant Orders   Ambulatory referral to Psychology   Need for shingles vaccine       Relevant Orders   Varicella-zoster vaccine  IM (Shingrix) (Completed)      I am having Ms. Schermerhorn maintain her ferrous sulfate, Omega-3 Fatty Acids (FISH OIL PO), oxyCODONE-acetaminophen, traMADol, losartan, omeprazole, PARoxetine, ALPRAZolam, gabapentin, cyclobenzaprine, and levothyroxine.  No orders of the defined types were placed in this encounter.   CMA served as Education administrator during this visit. History, Physical and Plan performed by medical provider. Documentation and orders reviewed and attested to.  Ann Held, DO

## 2017-04-17 NOTE — Telephone Encounter (Signed)
Faxed hardcopy for chantix to [facility]

## 2017-04-17 NOTE — Patient Instructions (Signed)

## 2017-04-17 NOTE — Telephone Encounter (Signed)
Advise on this refill 

## 2017-04-21 ENCOUNTER — Telehealth: Payer: Self-pay | Admitting: Family Medicine

## 2017-04-21 ENCOUNTER — Encounter: Payer: Self-pay | Admitting: Family Medicine

## 2017-04-21 NOTE — Telephone Encounter (Signed)
Probably 81 mg

## 2017-04-21 NOTE — Telephone Encounter (Signed)
Do you remember what you told her to get?  Did not see in chart.

## 2017-04-21 NOTE — Telephone Encounter (Signed)
Caller name: Relationship to patient: Self Can be reached: [phone]  Pharmacy:  Reason for call: Request call back to find out what type of asprin provider is requesting her to take

## 2017-04-22 DIAGNOSIS — H40023 Open angle with borderline findings, high risk, bilateral: Secondary | ICD-10-CM | POA: Diagnosis not present

## 2017-04-22 DIAGNOSIS — H40022 Open angle with borderline findings, high risk, left eye: Secondary | ICD-10-CM | POA: Diagnosis not present

## 2017-04-22 DIAGNOSIS — H40021 Open angle with borderline findings, high risk, right eye: Secondary | ICD-10-CM | POA: Diagnosis not present

## 2017-04-22 DIAGNOSIS — E119 Type 2 diabetes mellitus without complications: Secondary | ICD-10-CM | POA: Diagnosis not present

## 2017-04-22 DIAGNOSIS — H25013 Cortical age-related cataract, bilateral: Secondary | ICD-10-CM | POA: Diagnosis not present

## 2017-04-22 DIAGNOSIS — H2513 Age-related nuclear cataract, bilateral: Secondary | ICD-10-CM | POA: Diagnosis not present

## 2017-04-22 LAB — HM DIABETES EYE EXAM

## 2017-04-22 NOTE — Telephone Encounter (Signed)
Patient notified

## 2017-04-29 ENCOUNTER — Encounter: Payer: Self-pay | Admitting: *Deleted

## 2017-05-06 ENCOUNTER — Telehealth: Payer: Self-pay | Admitting: *Deleted

## 2017-05-06 NOTE — Telephone Encounter (Signed)
Received request for Medical records from [place] [facility] [place], forwarded to [place] for email/scan/SLS 07/10

## 2017-05-13 ENCOUNTER — Ambulatory Visit: Payer: BLUE CROSS/BLUE SHIELD | Admitting: Podiatry

## 2017-05-20 ENCOUNTER — Encounter: Payer: Self-pay | Admitting: Podiatry

## 2017-05-20 ENCOUNTER — Ambulatory Visit (INDEPENDENT_AMBULATORY_CARE_PROVIDER_SITE_OTHER): Payer: BLUE CROSS/BLUE SHIELD | Admitting: Podiatry

## 2017-05-20 DIAGNOSIS — M722 Plantar fascial fibromatosis: Secondary | ICD-10-CM

## 2017-05-20 HISTORY — DX: Plantar fascial fibromatosis: M72.2

## 2017-05-20 NOTE — Progress Notes (Signed)
   Subjective:    Patient ID: [name], female    DOB: Feb 21, 1956, 61 y.o.   MRN: [id number]  HPI  Ms. Jim Like the office today for concerns of a mass in the bottom of the right foot which is been ongoing that she just noticed the last couple weeks. She states it is not hurting she is not is a change in size. She states that showing noticed it when she was taking toenail polish off. She denies any recent injury or trauma. Denies any swelling or redness or feet. She had no recent treatment. She has no other concerns.   Review of Systems  All other systems reviewed and are negative.      Objective:   Physical Exam General: AAO x3, NAD  Dermatological: Skin is warm, dry and supple bilateral. Nails x 10 are well manicured; remaining integument appears unremarkable at this time. There are no open sores, no preulcerative lesions, no rash or signs of infection present.  Vascular: Dorsalis Pedis artery and Posterior Tibial artery pedal pulses are 2/4 bilateral with immedate capillary fill time. There is no pain with calf compression, swelling, warmth, erythema.   Neruologic: Grossly intact via light touch bilateral. Vibratory intact via tuning fork bilateral. Protective threshold with Semmes Wienstein monofilament intact to all pedal sites bilateral.   Musculoskeletal: On the plantar aspect of the right foot along the medial band of plantar fascial within the arch of the foot is a firm not mobile soft tissue mass consistent with a plantar fibroma. There is a small area on the left foot in the same spot. There is no overlying edema, erythema, increase in warmth. No other area tenderness identified this time. Muscular strength 5/5 in all groups tested bilateral.  Gait: Unassisted, Nonantalgic.      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 No acute changes since last appointment, and no other complaints at this time.   Objective: AAO x3, NAD DP/PT pulses palpable bilaterally, CRT less than 3 seconds Protective sensation intact with Simms Weinstein monofilament, vibratory sensation intact, Achilles tendon reflex intact No areas of pinpoint bony tenderness or pain with vibratory sensation. MMT 5/5, ROM WNL. No edema, erythema, increase in warmth to bilateral lower extremities.  No open lesions or pre-ulcerative lesions.  No pain with calf compression, swelling, warmth, erythema  Assessment: Plantar fibroma  Plan: -All treatment options discussed with the patient including all alternatives, risks, complications.  -Etiology of symptoms were discussed -Discussed both conservative and surgical treatment options. We will continue with conservative treatment. There is not painful and she wishes to hold off on steroid injection. I did order compound cream verapamil that she continues daily to the area. Recommend stretching exercises daily. -Discussed MRI as well but she would like to see other cream does before proceeding with this. -RTC 6 weeks or sooner if needed. This any worsening of symptoms before undergoing call the office for sooner appointment.  Celesta Gentile, DPM

## 2017-05-20 NOTE — Patient Instructions (Signed)
You do NOT have plantar fasciitis like below. However, I do want you to do these exercises daily to help.   I have ordered a cream for you that will come from [employer]. They will mail this to you.   It was nice to meet you today. If you have any questions, please feel free to call us  ----   Plantar Fasciitis Rehab Ask your health care provider which exercises are safe for you. Do exercises exactly as told by your health care provider and adjust them as directed. It is normal to feel mild stretching, pulling, tightness, or discomfort as you do these exercises, but you should stop right away if you feel sudden pain or your pain gets worse. Do not begin these exercises until told by your health care provider. Stretching and range of motion exercises These exercises warm up your muscles and joints and improve the movement and flexibility of your foot. These exercises also help to relieve pain. Exercise A: Plantar fascia stretch  1. Sit with your left / right leg crossed over your opposite knee. 2. Hold your heel with one hand with that thumb near your arch. With your other hand, hold your toes and gently pull them back toward the top of your foot. You should feel a stretch on the bottom of your toes or your foot or both. 3. Hold this stretch for__________ seconds. 4. Slowly release your toes and return to the starting position. Repeat __________ times. Complete this exercise __________ times a day. Exercise B: Gastroc, standing  1. Stand with your hands against a wall. 2. Extend your left / right leg behind you, and bend your front knee slightly. 3. Keeping your heels on the floor and keeping your back knee straight, shift your weight toward the wall without arching your back. You should feel a gentle stretch in your left / right calf. 4. Hold this position for __________ seconds. Repeat __________ times. Complete this exercise __________ times a day. Exercise C: Soleus,  standing 1. Stand with your hands against a wall. 2. Extend your left / right leg behind you, and bend your front knee slightly. 3. Keeping your heels on the floor, bend your back knee and slightly shift your weight over the back leg. You should feel a gentle stretch deep in your calf. 4. Hold this position for __________ seconds. Repeat __________ times. Complete this exercise __________ times a day. Exercise D: Gastrocsoleus, standing 1. Stand with the ball of your left / right foot on a step. The ball of your foot is on the walking surface, right under your toes. 2. Keep your other foot firmly on the same step. 3. Hold onto the wall or a railing for balance. 4. Slowly lift your other foot, allowing your body weight to press your heel down over the edge of the step. You should feel a stretch in your left / right calf. 5. Hold this position for __________ seconds. 6. Return both feet to the step. 7. Repeat this exercise with a slight bend in your left / right knee. Repeat __________ times with your left / right knee straight and __________ times with your left / right knee bent. Complete this exercise __________ times a day. Balance exercise This exercise builds your balance and strength control of your arch to help take pressure off your plantar fascia. Exercise E: Single leg stand 1. Without shoes, stand near a railing or in a doorway. You may hold onto the railing or door  frame as needed. 2. Stand on your left / right foot. Keep your big toe down on the floor and try to keep your arch lifted. Do not let your foot roll inward. 3. Hold this position for __________ seconds. 4. If this exercise is too easy, you can try it with your eyes closed or while standing on a pillow. Repeat __________ times. Complete this exercise __________ times a day. This information is not intended to replace advice given to you by your health care provider. Make sure you discuss any questions you have with your  health care provider. Document Released: 10/14/2005 Document Revised: 06/18/2016 Document Reviewed: 08/28/2015 Elsevier Interactive Patient Education  2018 [employer].

## 2017-06-07 ENCOUNTER — Other Ambulatory Visit: Payer: Self-pay | Admitting: Family Medicine

## 2017-06-17 ENCOUNTER — Ambulatory Visit (INDEPENDENT_AMBULATORY_CARE_PROVIDER_SITE_OTHER): Payer: BLUE CROSS/BLUE SHIELD | Admitting: Behavioral Health

## 2017-06-17 ENCOUNTER — Ambulatory Visit: Payer: BLUE CROSS/BLUE SHIELD

## 2017-06-17 DIAGNOSIS — Z23 Encounter for immunization: Secondary | ICD-10-CM

## 2017-06-17 NOTE — Progress Notes (Signed)
Pre visit review using our clinic review tool, if applicable. No additional management support is needed unless otherwise documented below in the visit note.  Patient came in clinic for 2nd Shingrix vaccination. IM injection was given in the left deltoid. Patient tolerated injection well.  

## 2017-08-05 ENCOUNTER — Telehealth: Payer: Self-pay | Admitting: *Deleted

## 2017-08-05 NOTE — Telephone Encounter (Signed)
Received request for New Referral for lung cancer screening from Pulmonology d/t previous referral expired; forwarded to provider/SLS 10/09

## 2017-08-07 NOTE — Telephone Encounter (Signed)
Provider would like to know if patient wants her to place new referral/order for Lung cancer screening; [facility] with contact name and number for return call RE: her decision per provider instructions/SLS 10/11

## 2017-08-12 ENCOUNTER — Ambulatory Visit (INDEPENDENT_AMBULATORY_CARE_PROVIDER_SITE_OTHER): Payer: BLUE CROSS/BLUE SHIELD | Admitting: Family Medicine

## 2017-08-12 ENCOUNTER — Encounter: Payer: Self-pay | Admitting: Family Medicine

## 2017-08-12 VITALS — BP 140/90 | HR 80 | Temp 98.3°F | Ht 63.75 in | Wt 158.0 lb

## 2017-08-12 DIAGNOSIS — D649 Anemia, unspecified: Secondary | ICD-10-CM | POA: Diagnosis not present

## 2017-08-12 DIAGNOSIS — Z23 Encounter for immunization: Secondary | ICD-10-CM

## 2017-08-12 DIAGNOSIS — I10 Essential (primary) hypertension: Secondary | ICD-10-CM | POA: Diagnosis not present

## 2017-08-12 DIAGNOSIS — E785 Hyperlipidemia, unspecified: Secondary | ICD-10-CM | POA: Diagnosis not present

## 2017-08-12 DIAGNOSIS — F411 Generalized anxiety disorder: Secondary | ICD-10-CM

## 2017-08-12 DIAGNOSIS — K219 Gastro-esophageal reflux disease without esophagitis: Secondary | ICD-10-CM | POA: Diagnosis not present

## 2017-08-12 DIAGNOSIS — F17200 Nicotine dependence, unspecified, uncomplicated: Secondary | ICD-10-CM

## 2017-08-12 DIAGNOSIS — E039 Hypothyroidism, unspecified: Secondary | ICD-10-CM | POA: Diagnosis not present

## 2017-08-12 DIAGNOSIS — G8929 Other chronic pain: Secondary | ICD-10-CM

## 2017-08-12 LAB — CBC WITH DIFFERENTIAL/PLATELET
BASOS PCT: 0.3 % (ref 0.0–3.0)
Basophils Absolute: 0 10*3/uL (ref 0.0–0.1)
EOS ABS: 0 10*3/uL (ref 0.0–0.7)
EOS PCT: 0.5 % (ref 0.0–5.0)
HCT: 37.8 % (ref 36.0–46.0)
HEMOGLOBIN: 12.5 g/dL (ref 12.0–15.0)
LYMPHS PCT: 32.2 % (ref 12.0–46.0)
Lymphs Abs: 2.3 10*3/uL (ref 0.7–4.0)
MCHC: 33.1 g/dL (ref 30.0–36.0)
MCV: 84.6 fl (ref 78.0–100.0)
MONOS PCT: 6.1 % (ref 3.0–12.0)
Monocytes Absolute: 0.4 10*3/uL (ref 0.1–1.0)
Neutro Abs: 4.4 10*3/uL (ref 1.4–7.7)
Neutrophils Relative %: 60.9 % (ref 43.0–77.0)
Platelets: 263 10*3/uL (ref 150.0–400.0)
RBC: 4.47 Mil/uL (ref 3.87–5.11)
RDW: 13.7 % (ref 11.5–15.5)
WBC: 7.2 10*3/uL (ref 4.0–10.5)

## 2017-08-12 LAB — COMPREHENSIVE METABOLIC PANEL
ALT: 15 U/L (ref 0–35)
AST: 23 U/L (ref 0–37)
Albumin: 4.3 g/dL (ref 3.5–5.2)
Alkaline Phosphatase: 88 U/L (ref 39–117)
BUN: 13 mg/dL (ref 6–23)
CALCIUM: 10.1 mg/dL (ref 8.4–10.5)
CHLORIDE: 106 meq/L (ref 96–112)
CO2: 28 meq/L (ref 19–32)
Creatinine, Ser: 0.67 mg/dL (ref 0.40–1.20)
GFR: 114.68 mL/min (ref >60.00)
GLUCOSE: 93 mg/dL (ref 70–99)
Potassium: 4.9 mEq/L (ref 3.5–5.1)
Sodium: 141 mEq/L (ref 135–145)
Total Bilirubin: 0.5 mg/dL (ref 0.2–1.2)
Total Protein: 8.3 g/dL (ref 6.0–8.3)

## 2017-08-12 LAB — POC URINALSYSI DIPSTICK (AUTOMATED)
BILIRUBIN UA: NEGATIVE
Blood, UA: NEGATIVE
GLUCOSE UA: NEGATIVE
Ketones, UA: NEGATIVE
Leukocytes, UA: NEGATIVE
Nitrite, UA: NEGATIVE
Protein, UA: NEGATIVE
SPEC GRAV UA: 1.01 (ref 1.010–1.025)
UROBILINOGEN UA: 0.2 U/dL
pH, UA: 6 (ref 5.0–8.0)

## 2017-08-12 LAB — LIPID PANEL
CHOL/HDL RATIO: 3
Cholesterol: 191 mg/dL (ref 0–200)
HDL: 65.5 mg/dL (ref >39.00)
LDL CALC: 107 mg/dL — AB (ref 0–99)
NONHDL: 125.18
TRIGLYCERIDES: 91 mg/dL (ref 0.0–149.0)
VLDL: 18.2 mg/dL (ref 0.0–40.0)

## 2017-08-12 LAB — TSH: TSH: 1.44 u[IU]/mL (ref 0.35–4.50)

## 2017-08-12 MED ORDER — PRAVASTATIN SODIUM 20 MG PO TABS: 20.0000 mg | ORAL_TABLET | Freq: Every evening | ORAL | 1 refills | Status: DC

## 2017-08-12 MED ORDER — METOPROLOL SUCCINATE ER 50 MG PO TB24: ORAL_TABLET | ORAL | 1 refills | Status: DC

## 2017-08-12 MED ORDER — PAROXETINE HCL 30 MG PO TABS: 30.0000 mg | ORAL_TABLET | Freq: Every day | ORAL | 1 refills | Status: DC

## 2017-08-12 MED ORDER — VARENICLINE TARTRATE 1 MG PO TABS: 1.0000 mg | ORAL_TABLET | Freq: Two times a day (BID) | ORAL | 2 refills | Status: DC

## 2017-08-12 MED ORDER — LOSARTAN POTASSIUM 100 MG PO TABS: 100.0000 mg | ORAL_TABLET | Freq: Every day | ORAL | 3 refills | Status: DC

## 2017-08-12 MED ORDER — GABAPENTIN 600 MG PO TABS: 600.0000 mg | ORAL_TABLET | Freq: Three times a day (TID) | ORAL | 3 refills | Status: DC

## 2017-08-12 MED ORDER — LEVOTHYROXINE SODIUM 50 MCG PO TABS: 50.0000 ug | ORAL_TABLET | Freq: Every day | ORAL | 3 refills | Status: DC

## 2017-08-12 MED ORDER — OMEPRAZOLE 40 MG PO CPDR: 40.0000 mg | DELAYED_RELEASE_CAPSULE | Freq: Every day | ORAL | 3 refills | Status: DC

## 2017-08-12 MED ORDER — FERROUS SULFATE DRIED ER 160 (50 FE) MG PO TBCR: 1.0000 | EXTENDED_RELEASE_TABLET | Freq: Every day | ORAL | 3 refills | Status: DC

## 2017-08-12 NOTE — Progress Notes (Signed)
Patient ID: Debra [name], female    DOB: 10-15-1956  Age: 61 y.o. MRN: [id number]    Subjective:  Subjective  HPI MARAE [name] presents for f/u bp, cholesterol  And thyroid  She is under a lot of stress at home and received a hurtful text from her son prior to arriving to the office so she was visibly upset -- we talked about it for a few min before moving on to the reason for the visit. Review of Systems  Constitutional: Negative for appetite change, diaphoresis, fatigue and unexpected weight change.  Eyes: Negative for pain, redness and visual disturbance.  Respiratory: Negative for cough, chest tightness, shortness of breath and wheezing.   Cardiovascular: Negative for chest pain, palpitations and leg swelling.  Endocrine: Negative for cold intolerance, heat intolerance, polydipsia, polyphagia and polyuria.  Genitourinary: Negative for difficulty urinating, dysuria and frequency.  Neurological: Negative for dizziness, light-headedness, numbness and headaches.    History Past Medical History:  Diagnosis Date  . Anxiety   . DDD (degenerative disc disease)   . GERD (gastroesophageal reflux disease)   . 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 Thyroid disease    Hypothyroidism    She has a past surgical history that includes Carpal tunnel release; Leg Surgery; Cervical spine surgery; and Wrist fracture surgery (10-02-15).   Her family history includes Arthritis in her mother; Breast cancer in her mother; Cancer in her mother; Colon cancer in her maternal grandfather; Diabetes in her sister; Heart disease in her father; Heart disease (age of onset: 55) in her mother; Hyperlipidemia in her sister; Hypertension in her father and sister; Stroke in her father.She reports that she has been smoking Cigarettes.  She has a 20.00 pack-year smoking history. She has never used smokeless tobacco. She reports that she drinks alcohol. She reports that she does not use  drugs.  Current Outpatient Prescriptions on File Prior to Visit  Medication Sig Dispense Refill  . ALPRAZolam (XANAX) 0.25 MG tablet Take 1 tablet (0.25 mg total) by mouth 2 (two) times daily as needed for anxiety. 20 tablet 0  . cyclobenzaprine (FLEXERIL) 10 MG tablet Take 1 tablet (10 mg total) by mouth at bedtime as needed for muscle spasms. 21 tablet 0  . NON FORMULARY [facility]  Scar Cream -  Verapamil 10%, Pentoxifylline 5% Apply 1-2 grams to affected area 3-4 times daily Qty. 120 gm 3 refills    . Omega-3 Fatty Acids (FISH OIL PO) Take 1 tablet by mouth as needed. Reported on 03/12/2016    . oxyCODONE-acetaminophen (PERCOCET) 7.5-325 MG tablet Take 1 tablet by mouth every 4 (four) hours as needed for severe pain. Reported on 05/14/2016    . traMADol (ULTRAM) 50 MG tablet Take 50 mg by mouth every 6 (six) hours as needed.     No current facility-administered medications on file prior to visit.      Objective:  Objective  Physical Exam  Constitutional: She is oriented to person, place, and time. She appears well-developed and well-nourished.  HENT:  Head: Normocephalic and atraumatic.  Eyes: Conjunctivae and EOM are normal.  Neck: Normal range of motion. Neck supple. No JVD present. Carotid bruit is not present. No thyromegaly present.  Cardiovascular: Normal rate, regular rhythm and normal heart sounds.   No murmur heard. Pulmonary/Chest: Effort normal and breath sounds normal. No respiratory distress. She has no wheezes. She has no rales. She exhibits no tenderness.  Musculoskeletal: She exhibits no edema.  Neurological: She is alert and oriented to person, place, and time.  Psychiatric: She has a normal mood and affect.  Nursing note and vitals reviewed.  BP 140/90   Pulse 80   Temp 98.3 F (36.8 C) (Oral)   Ht 5' 3.75" (1.619 m)   Wt 158 lb (71.7 kg)   SpO2 98%   BMI 27.33 kg/m  Wt Readings from Last 3 Encounters:  08/12/17 158 lb (71.7 kg)  04/21/17 148  lb 9.6 oz (67.4 kg)  03/07/17 147 lb 2 oz (66.7 kg)     Lab Results  Component Value Date   WBC 7.2 08/12/2017   HGB 12.5 08/12/2017   HCT 37.8 08/12/2017   PLT 263.0 08/12/2017   GLUCOSE 93 08/12/2017   CHOL 191 08/12/2017   TRIG 91.0 08/12/2017   HDL 65.50 08/12/2017   LDLDIRECT 126.3 06/06/2011   LDLCALC 107 (H) 08/12/2017   ALT 15 08/12/2017   AST 23 08/12/2017   NA 141 08/12/2017   K 4.9 08/12/2017   CL 106 08/12/2017   CREATININE 0.67 08/12/2017   BUN 13 08/12/2017   CO2 28 08/12/2017   TSH 1.44 08/12/2017   HGBA1C 6.3 03/12/2016   MICROALBUR 3.1 (H) 11/07/2014    Dg Lumbar Spine Complete  Result Date: 04/17/2017 CLINICAL DATA:  Lower back pain after fall 2 weeks ago. EXAM: LUMBAR SPINE - COMPLETE 4+ VIEW COMPARISON:  Radiographs of Mar 07, 2017. FINDINGS: No fracture or spondylolisthesis is noted. Disc spaces are well-maintained. Mild anterior osteophyte formation is noted at L1-2. Atherosclerosis of abdominal aorta is noted. Degenerative changes seen involving posterior facet joints of L5-S1. IMPRESSION: Mild degenerative changes as described above. Aortic atherosclerosis. No acute abnormality seen in the lumbar spine. Electronically Signed   By: Marijo Conception, M.D.   On: 04/17/2017 12:43     Assessment & Plan:  Plan  I have discontinued [name]'s varenicline. I have also changed her levothyroxine and gabapentin. Additionally, I am having her start on varenicline. Lastly, I am having her maintain her Omega-3 Fatty Acids (FISH OIL PO), oxyCODONE-acetaminophen, traMADol, ALPRAZolam, cyclobenzaprine, NON FORMULARY, pravastatin, PARoxetine, omeprazole, metoprolol succinate, losartan, and ferrous sulfate.  Meds ordered this encounter  Medications  . pravastatin (PRAVACHOL) 20 MG tablet    Sig: Take 1 tablet (20 mg total) by mouth every evening.    Dispense:  90 tablet    Refill:  1    Please consider 90 day supplies to promote better adherence  . PARoxetine  (PAXIL) 30 MG tablet    Sig: Take 1 tablet (30 mg total) by mouth daily.    Dispense:  90 tablet    Refill:  1    D/C PREVIOUS SCRIPTS FOR THIS MEDICATION  . omeprazole (PRILOSEC) 40 MG capsule    Sig: Take 1 capsule (40 mg total) by mouth daily.    Dispense:  90 capsule    Refill:  3  . metoprolol succinate (TOPROL-XL) 50 MG 24 hr tablet    Sig: TAKE ONE TABLET BY MOUTH TWICE DAILY    Dispense:  180 tablet    Refill:  1  . losartan (COZAAR) 100 MG tablet    Sig: Take 1 tablet (100 mg total) by mouth daily.    Dispense:  90 tablet    Refill:  3  . levothyroxine (SYNTHROID, LEVOTHROID) 50 MCG tablet    Sig: Take 1 tablet (50 mcg total) by mouth daily.    Dispense:  90 tablet  Refill:  3    Please consider 90 day supplies to promote better adherence  . gabapentin (NEURONTIN) 600 MG tablet    Sig: Take 1 tablet (600 mg total) by mouth 3 (three) times daily.    Dispense:  270 tablet    Refill:  3    Please consider 90 day supplies to promote better adherence  . ferrous sulfate (SLOW FE) 160 (50 Fe) MG TBCR SR tablet    Sig: Take 1 tablet (160 mg total) by mouth daily.    Dispense:  90 each    Refill:  3  . varenicline (CHANTIX CONTINUING MONTH PAK) 1 MG tablet    Sig: Take 1 tablet (1 mg total) by mouth 2 (two) times daily.    Dispense:  60 tablet    Refill:  2    Problem List Items Addressed This Visit      Unprioritized   Essential hypertension    Well controlled, no changes to meds. Encouraged heart healthy diet such as the DASH diet and exercise as tolerated.       Relevant Medications   pravastatin (PRAVACHOL) 20 MG tablet   metoprolol succinate (TOPROL-XL) 50 MG 24 hr tablet   losartan (COZAAR) 100 MG tablet   Other Relevant Orders   Comprehensive metabolic panel (Completed)   CBC with Differential/Platelet (Completed)   POCT Urinalysis Dipstick (Automated) (Completed)   GERD   Relevant Medications   omeprazole (PRILOSEC) 40 MG capsule   Hyperlipidemia     Tolerating statin, encouraged heart healthy diet, avoid trans fats, minimize simple carbs and saturated fats. Increase exercise as tolerated      Relevant Medications   pravastatin (PRAVACHOL) 20 MG tablet   metoprolol succinate (TOPROL-XL) 50 MG 24 hr tablet   losartan (COZAAR) 100 MG tablet   Other Relevant Orders   Lipid panel (Completed)   Comprehensive metabolic panel (Completed)   Hypothyroidism   Relevant Medications   metoprolol succinate (TOPROL-XL) 50 MG 24 hr tablet   levothyroxine (SYNTHROID, LEVOTHROID) 50 MCG tablet   Other Relevant Orders   TSH (Completed)   TOBACCO USER   Relevant Medications   varenicline (CHANTIX CONTINUING MONTH PAK) 1 MG tablet   Other Relevant Orders   Ambulatory Referral for Lung Cancer Scre   UNSPECIFIED ANEMIA   Relevant Medications   ferrous sulfate (SLOW FE) 160 (50 Fe) MG TBCR SR tablet    Other Visit Diagnoses    Need for immunization against influenza    -  Primary   Relevant Orders   Flu Vaccine QUAD 6+ mos IM (Fluarix) (Completed)   Generalized anxiety disorder       Relevant Medications   PARoxetine (PAXIL) 30 MG tablet   Other chronic pain       Relevant Medications   PARoxetine (PAXIL) 30 MG tablet   gabapentin (NEURONTIN) 600 MG tablet      Follow-up: Return in about 6 months (around 02/10/2018) for annual exam, fasting.  Ann Held, DO

## 2017-08-12 NOTE — Patient Instructions (Signed)

## 2017-08-13 ENCOUNTER — Other Ambulatory Visit: Payer: Self-pay | Admitting: Acute Care

## 2017-08-13 DIAGNOSIS — Z122 Encounter for screening for malignant neoplasm of respiratory organs: Secondary | ICD-10-CM

## 2017-08-13 DIAGNOSIS — F17210 Nicotine dependence, cigarettes, uncomplicated: Secondary | ICD-10-CM

## 2017-08-13 NOTE — Assessment & Plan Note (Signed)
Well controlled, no changes to meds. Encouraged heart healthy diet such as the DASH diet and exercise as tolerated.  °

## 2017-08-13 NOTE — Assessment & Plan Note (Signed)
Tolerating statin, encouraged heart healthy diet, avoid trans fats, minimize simple carbs and saturated fats. Increase exercise as tolerated 

## 2017-08-25 ENCOUNTER — Ambulatory Visit
Admission: RE | Admit: 2017-08-25 | Discharge: 2017-08-25 | Disposition: A | Discharge status: Home / Self Care | Payer: BLUE CROSS/BLUE SHIELD | Source: Ambulatory Visit | Attending: Acute Care | Admitting: Acute Care

## 2017-08-25 ENCOUNTER — Encounter: Payer: Self-pay | Admitting: Acute Care

## 2017-08-25 ENCOUNTER — Ambulatory Visit (INDEPENDENT_AMBULATORY_CARE_PROVIDER_SITE_OTHER): Payer: BLUE CROSS/BLUE SHIELD | Admitting: Acute Care

## 2017-08-25 DIAGNOSIS — Z87891 Personal history of nicotine dependence: Secondary | ICD-10-CM | POA: Diagnosis not present

## 2017-08-25 DIAGNOSIS — F17210 Nicotine dependence, cigarettes, uncomplicated: Secondary | ICD-10-CM

## 2017-08-25 DIAGNOSIS — Z122 Encounter for screening for malignant neoplasm of respiratory organs: Secondary | ICD-10-CM

## 2017-08-25 DIAGNOSIS — R918 Other nonspecific abnormal finding of lung field: Secondary | ICD-10-CM | POA: Diagnosis not present

## 2017-08-25 NOTE — Progress Notes (Signed)
Shared Decision Making Visit Lung Cancer Screening Program [phone])   Eligibility:  Age 61 y.o.  Pack Years Smoking History Calculation  (# packs/per year x # years smoked)  Recent History of coughing up blood  no  Unexplained weight loss? no ( >Than 15 pounds within the last 6 months )  Prior History Lung / other cancer no (Diagnosis within the last 5 years already requiring surveillance chest CT Scans).  Smoking Status Current Smoker  Former Smokers: Years since quit: NA  Quit Date: NA   Visit Components:  Discussion included one or more decision making aids. yes  Discussion included risk/benefits of screening. yes  Discussion included potential follow up diagnostic testing for abnormal scans. yes  Discussion included meaning and risk of over diagnosis. yes  Discussion included meaning and risk of False Positives. yes  Discussion included meaning of total radiation exposure. yes  Counseling Included:  Importance of adherence to annual lung cancer LDCT screening. yes  Impact of comorbidities on ability to participate in the program. yes  Ability and willingness to under diagnostic treatment. yes  Smoking Cessation Counseling:  Current Smokers:   Discussed importance of smoking cessation. yes  Information about tobacco cessation classes and interventions provided to patient. yes  Patient provided with "ticket" for LDCT Scan. yes  Symptomatic Patient.No   Counseling  Diagnosis Code: Tobacco Use Z72.0  Asymptomatic Patient yes  Counseling (Intermediate counseling: > three minutes counseling) Z3086  Former Smokers:   Discussed the importance of maintaining cigarette abstinence. yes  Diagnosis Code: Personal History of Nicotine Dependence. V78.469  Information about tobacco cessation classes and interventions provided to patient. Yes  Patient provided with "ticket" for LDCT Scan. yes  Written Order for Lung Cancer Screening with LDCT placed in  Epic. Yes (CT Chest Lung Cancer Screening Low Dose W/O CM) [id number] Z12.2-Screening of respiratory organs Z87.891-Personal history of nicotine dependence  I have spent 25 minutes of face to face time with Elmer Ramp discussing the risks and benefits of lung cancer screening. We viewed a power point together that explained in detail the above noted topics. We paused at intervals to allow for questions to be asked and answered to ensure understanding.We discussed that the single most powerful action that she can take to decrease her risk of developing lung cancer is to quit smoking. We discussed whether or not she is ready to commit to setting a quit date. We discussed options for tools to aid in quitting smoking including nicotine replacement therapy, non-nicotine medications, support groups, Quit Smart classes, and behavior modification. We discussed that often times setting smaller, more achievable goals, such as eliminating 1 cigarette a day for a week and then 2 cigarettes a day for a week can be helpful in slowly decreasing the number of cigarettes smoked. This allows for a sense of accomplishment as well as providing a clinical benefit. I gave her the " Be Stronger Than Your Excuses" card with contact information for community resources, classes, free nicotine replacement therapy, and access to mobile apps, text messaging, and on-line smoking cessation help. I have also given her my card and contact information in the event she needs to contact me. We discussed the time and location of the scan, and that either Doroteo Glassman RN or I will call with the results within 24-48 hours of receiving them. I have offered Latera Mclin a copy of the power point we viewed  as a resource in the event they need reinforcement of the concepts we  discussed today in the office. The patient verbalized understanding of all of  the above and had no further questions upon leaving the office. They have my contact information  in the event they have any further questions.  I spent 4 minutes counseling on smoking cessation and the health risks of continued tobacco abuse.  I explained to the patient that there has been a high incidence of coronary artery disease noted on these exams. I explained that this is a non-gated exam therefore degree or severity cannot be determined. This patient is currently on statin therapy, however verbalized that she is not compliant with medication daily.. I have asked the patient to follow-up with their PCP regarding any incidental finding of coronary artery disease and management with diet or medication as their PCP  feels is clinically indicated. The patient verbalized understanding of the above and had no further questions upon completion of the visit.      Magdalen Spatz, NP 08/25/2017

## 2017-08-26 ENCOUNTER — Telehealth: Payer: Self-pay | Admitting: Family Medicine

## 2017-08-26 NOTE — Telephone Encounter (Signed)
Patient wants more meds, patient was unable to provide any info such as name doze and etc.  [phone]

## 2017-08-27 ENCOUNTER — Other Ambulatory Visit: Payer: Self-pay

## 2017-08-27 NOTE — Telephone Encounter (Signed)
Returned Pt's call to touch base on what Rx she had called in and requested before. Note stated that she didn't leave a note or dosage for what she was calling for. Upon returning Pt's call she asked me to "send in something for ADD attention deficit disorder". Pt has no history of taking anything for ADD nor is there a Dx. Pt stated that she may have mentioned wanting it in the past to provider and thought we could send her "something in to help". I advised Pt to come in and be seen by provider. Appointment set for Friday 08/29/17

## 2017-08-28 ENCOUNTER — Telehealth: Payer: Self-pay | Admitting: Acute Care

## 2017-08-28 DIAGNOSIS — Z122 Encounter for screening for malignant neoplasm of respiratory organs: Secondary | ICD-10-CM

## 2017-08-28 DIAGNOSIS — F17210 Nicotine dependence, cigarettes, uncomplicated: Secondary | ICD-10-CM

## 2017-08-29 ENCOUNTER — Encounter: Payer: Self-pay | Admitting: Family Medicine

## 2017-08-29 ENCOUNTER — Ambulatory Visit (INDEPENDENT_AMBULATORY_CARE_PROVIDER_SITE_OTHER): Payer: BLUE CROSS/BLUE SHIELD | Admitting: Family Medicine

## 2017-08-29 VITALS — BP 144/71 | HR 64 | Temp 98.4°F | Resp 16 | Ht 64.0 in | Wt 161.8 lb

## 2017-08-29 DIAGNOSIS — R41840 Attention and concentration deficit: Secondary | ICD-10-CM

## 2017-08-29 DIAGNOSIS — F988 Other specified behavioral and emotional disorders with onset usually occurring in childhood and adolescence: Secondary | ICD-10-CM | POA: Diagnosis not present

## 2017-08-29 MED ORDER — ATOMOXETINE HCL 40 MG PO CAPS: ORAL_CAPSULE | ORAL | 0 refills | Status: DC

## 2017-08-29 NOTE — Patient Instructions (Signed)
Take 40 mg qd with food x 3 days then increase to 80 mg daily Follow up here in 1 month

## 2017-08-29 NOTE — Progress Notes (Signed)
Patient ID: Debra [name], female    DOB: 1956/04/21  Age: 61 y.o. MRN: [id number]    Subjective:  Subjective  HPI MAKIAH [name] presents for her depression and thinks she may have ADD.  She has trouble focusing and her son had add.    Review of Systems  Constitutional: Positive for fatigue. Negative for activity change, appetite change and unexpected weight change.  Respiratory: Negative for cough and shortness of breath.   Cardiovascular: Negative for chest pain and palpitations.  Psychiatric/Behavioral: Positive for decreased concentration and dysphoric mood. Negative for behavioral problems, confusion, self-injury, sleep disturbance and suicidal ideas. The patient is not nervous/anxious and is not hyperactive.     History Past Medical History:  Diagnosis Date  . Anxiety   . DDD (degenerative disc disease)   . GERD (gastroesophageal reflux disease)   . 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 Thyroid disease    Hypothyroidism    She has a past surgical history that includes Carpal tunnel release; Leg Surgery; Cervical spine surgery; and Wrist fracture surgery (10-02-15).   Her family history includes Arthritis in her mother; Breast cancer in her mother; Cancer in her mother; Colon cancer in her maternal grandfather; Diabetes in her sister; Heart disease in her father; Heart disease (age of onset: 76) in her mother; Hyperlipidemia in her sister; Hypertension in her father and sister; Stroke in her father.She reports that she has been smoking Cigarettes.  She has a 33.00 pack-year smoking history. She has never used smokeless tobacco. She reports that she drinks alcohol. She reports that she does not use drugs.  Current Outpatient Prescriptions on File Prior to Visit  Medication Sig Dispense Refill  . cyclobenzaprine (FLEXERIL) 10 MG tablet Take 1 tablet (10 mg total) by mouth at bedtime as needed for muscle spasms. 21 tablet 0  . ferrous sulfate (SLOW FE) 160  (50 Fe) MG TBCR SR tablet Take 1 tablet (160 mg total) by mouth daily. 90 each 3  . gabapentin (NEURONTIN) 600 MG tablet Take 1 tablet (600 mg total) by mouth 3 (three) times daily. 270 tablet 3  . levothyroxine (SYNTHROID, LEVOTHROID) 50 MCG tablet Take 1 tablet (50 mcg total) by mouth daily. 90 tablet 3  . losartan (COZAAR) 100 MG tablet Take 1 tablet (100 mg total) by mouth daily. 90 tablet 3  . metoprolol succinate (TOPROL-XL) 50 MG 24 hr tablet TAKE ONE TABLET BY MOUTH TWICE DAILY 180 tablet 1  . NON FORMULARY [facility]  Scar Cream -  Verapamil 10%, Pentoxifylline 5% Apply 1-2 grams to affected area 3-4 times daily Qty. 120 gm 3 refills    . Omega-3 Fatty Acids (FISH OIL PO) Take 1 tablet by mouth as needed. Reported on 03/12/2016    . omeprazole (PRILOSEC) 40 MG capsule Take 1 capsule (40 mg total) by mouth daily. 90 capsule 3  . PARoxetine (PAXIL) 30 MG tablet Take 1 tablet (30 mg total) by mouth daily. 90 tablet 1  . pravastatin (PRAVACHOL) 20 MG tablet Take 1 tablet (20 mg total) by mouth every evening. 90 tablet 1  . varenicline (CHANTIX CONTINUING MONTH PAK) 1 MG tablet Take 1 tablet (1 mg total) by mouth 2 (two) times daily. 60 tablet 2   No current facility-administered medications on file prior to visit.      Objective:  Objective  Physical Exam  Constitutional: She is oriented to person, place, and time. She appears well-developed and well-nourished.  HENT:  Head: Normocephalic and atraumatic.  Eyes: Conjunctivae and EOM are normal.  Neck: Normal range of motion. Neck supple. No JVD present. Carotid bruit is not present. No thyromegaly present.  Cardiovascular: Normal rate, regular rhythm and normal heart sounds.   No murmur heard. Pulmonary/Chest: Effort normal and breath sounds normal. No respiratory distress. She has no wheezes. She has no rales. She exhibits no tenderness.  Musculoskeletal: She exhibits no edema.  Neurological: She is alert and oriented to  person, place, and time.  Psychiatric: Her speech is normal. Judgment and thought content normal. Her mood appears anxious. She is not agitated, not hyperactive, not slowed, not withdrawn, not actively hallucinating and not combative. Cognition and memory are normal. She exhibits a depressed mood.  Add adult questions---  + for ADHD She is inattentive.  Nursing note and vitals reviewed.  BP (!) 144/71   Pulse 64   Temp 98.4 F (36.9 C) (Oral)   Resp 16   Ht 5\' 4"  (1.626 m)   Wt 161 lb 12.8 oz (73.4 kg)   SpO2 95%   BMI 27.77 kg/m  Wt Readings from Last 3 Encounters:  08/29/17 161 lb 12.8 oz (73.4 kg)  08/12/17 158 lb (71.7 kg)  04/21/17 148 lb 9.6 oz (67.4 kg)     Lab Results  Component Value Date   WBC 7.2 08/12/2017   HGB 12.5 08/12/2017   HCT 37.8 08/12/2017   PLT 263.0 08/12/2017   GLUCOSE 93 08/12/2017   CHOL 191 08/12/2017   TRIG 91.0 08/12/2017   HDL 65.50 08/12/2017   LDLDIRECT 126.3 06/06/2011   LDLCALC 107 (H) 08/12/2017   ALT 15 08/12/2017   AST 23 08/12/2017   NA 141 08/12/2017   K 4.9 08/12/2017   CL 106 08/12/2017   CREATININE 0.67 08/12/2017   BUN 13 08/12/2017   CO2 28 08/12/2017   TSH 1.44 08/12/2017   HGBA1C 6.3 03/12/2016   MICROALBUR 3.1 (H) 11/07/2014    Ct Chest Lung Ca Screen Low Dose W/o Cm  Result Date: 08/26/2017 CLINICAL DATA:  Lung cancer screening. Current asymptomatic smoker. Thirty-three pack-year history. EXAM: CT CHEST WITHOUT CONTRAST LOW-DOSE FOR LUNG CANCER SCREENING TECHNIQUE: Multidetector CT imaging of the chest was performed following the standard protocol without IV contrast. COMPARISON:  CT chest from 11/06/2016. FINDINGS: Cardiovascular: The heart size is normal. No pericardial effusion. Calcification in the LAD coronary artery noted. Mediastinum/Nodes: The trachea appears patent and is midline. Normal appearance of the esophagus. Prominent axillary lymph nodes are identified. Index left axillary node measures 1.3 cm, image  19 of series 2. No enlarged mediastinal or hilar lymph nodes. Lungs/Pleura: No pleural effusion.  No pulmonary nodules identified. Upper Abdomen: No acute abnormality. Musculoskeletal: No chest wall mass or suspicious bone lesions identified. IMPRESSION: 1. Lung-RADS 1, negative. Continue annual screening with low-dose chest CT without contrast in 12 months. 2. Coronary artery calcifications. 3. Prominent axillary lymph nodes identified. The left axillary node measures up to 1.3 cm. Electronically Signed   By: Kerby Moors M.D.   On: 08/26/2017 09:36     Assessment & Plan:  Plan  I have discontinued Ms. Wehner's oxyCODONE-acetaminophen, traMADol, and ALPRAZolam. I am also having her start on atomoxetine. Additionally, I am having her maintain her Omega-3 Fatty Acids (FISH OIL PO), cyclobenzaprine, NON FORMULARY, pravastatin, PARoxetine, omeprazole, metoprolol succinate, losartan, levothyroxine, gabapentin, ferrous sulfate, and varenicline.  Meds ordered this encounter  Medications  . atomoxetine (STRATTERA) 40 MG capsule    Sig: 1 po  qd for 3 days then increase to 2 a day    Dispense:  60 capsule    Refill:  0    Problem List Items Addressed This Visit    None    Visit Diagnoses    Attention deficit disorder (ADD) without hyperactivity    -  Primary   Relevant Medications   atomoxetine (STRATTERA) 40 MG capsule   Other Relevant Orders   Ambulatory referral to Psychology      Follow-up: Return in about 4 weeks (around 09/26/2017), or add.  Ann Held, DO

## 2017-09-01 NOTE — Telephone Encounter (Signed)
Will forward to the lung nodule pool 

## 2017-09-02 NOTE — Telephone Encounter (Signed)
Pt informed of CT results per Sarah Groce, NP.  PT verbalized understanding.  Copy sent to PCP.  Order placed for 1 yr f/u CT.  

## 2017-09-17 ENCOUNTER — Ambulatory Visit: Payer: Self-pay | Admitting: *Deleted

## 2017-09-17 NOTE — Telephone Encounter (Signed)
  Reason for Disposition . [1] Sinus congestion as part of a cold AND [2] present < 10 days  Answer Assessment - Initial Assessment Questions 1. LOCATION: "Where does it hurt?"      nose 2. ONSET: "When did the sinus pain start?"  (e.g., hours, days)      For a about a week 3. SEVERITY: "How bad is the pain?"   (Scale 1-10; mild, moderate or severe)   - MILD (1-3): doesn't interfere with normal activities    - MODERATE (4-7): interferes with normal activities (e.g., work or school) or awakens from sleep   - SEVERE (8-10): excruciating pain and patient unable to do any normal activities        7-intermittent burning sensation 4. RECURRENT SYMPTOM: "Have you ever had sinus problems before?" If so, ask: "When was the last time?" and "What happened that time?"      Yes, along time ago 5. NASAL CONGESTION: "Is the nose blocked?" If so, ask, "Can you open it or must you breathe through the mouth?"     Nose congested 6. NASAL DISCHARGE: "Do you have discharge from your nose?" If so ask, "What color?"     Yes discharge, clear 7. FEVER: "Do you have a fever?" If so, ask: "What is it, how was it measured, and when did it start?"      No 8. OTHER SYMPTOMS: "Do you have any other symptoms?" (e.g., sore throat, cough, earache, difficulty breathing)     Cough, throat hurts sometimes  Protocols used: SINUS PAIN OR CONGESTION-A-AH   Pt having complaints of runny nose with congestion for the past week. Pt states she does not have any energy and feels bloated sometimes with eating. Pt states she was recently started on Straterrra and is concerned that her symptoms of decreased appetite, nausea could be possibly from the medications. Pt made aware that what she is feeling are some of the side effects listed for the medications. Pt also having complaints of burning sensation in nose with generalized fatigue. Care advice given to pt per protocol and advised pt if not feeling better in the next couple of days  an appt could be scheduled. Pt verbalized understanding and plans to follow suggestions.

## 2017-09-30 ENCOUNTER — Ambulatory Visit: Payer: BLUE CROSS/BLUE SHIELD | Admitting: Family Medicine

## 2017-10-01 ENCOUNTER — Other Ambulatory Visit: Payer: Self-pay | Admitting: Family Medicine

## 2017-10-01 DIAGNOSIS — F988 Other specified behavioral and emotional disorders with onset usually occurring in childhood and adolescence: Secondary | ICD-10-CM

## 2017-10-03 ENCOUNTER — Encounter: Payer: Self-pay | Admitting: Family Medicine

## 2017-10-03 ENCOUNTER — Ambulatory Visit: Payer: BLUE CROSS/BLUE SHIELD | Admitting: Family Medicine

## 2017-10-03 VITALS — BP 147/87 | HR 85 | Temp 98.3°F | Resp 16 | Ht 64.0 in | Wt 167.6 lb

## 2017-10-03 DIAGNOSIS — F17200 Nicotine dependence, unspecified, uncomplicated: Secondary | ICD-10-CM | POA: Diagnosis not present

## 2017-10-03 DIAGNOSIS — K219 Gastro-esophageal reflux disease without esophagitis: Secondary | ICD-10-CM

## 2017-10-03 DIAGNOSIS — E039 Hypothyroidism, unspecified: Secondary | ICD-10-CM

## 2017-10-03 DIAGNOSIS — R41840 Attention and concentration deficit: Secondary | ICD-10-CM | POA: Diagnosis not present

## 2017-10-03 MED ORDER — RANITIDINE HCL 150 MG PO CAPS: 150.0000 mg | ORAL_CAPSULE | Freq: Two times a day (BID) | ORAL | 5 refills | Status: DC

## 2017-10-03 MED ORDER — ATOMOXETINE HCL 100 MG PO CAPS: 100.0000 mg | ORAL_CAPSULE | Freq: Every day | ORAL | 1 refills | Status: DC

## 2017-10-03 MED ORDER — LEVOTHYROXINE SODIUM 50 MCG PO TABS: 50.0000 ug | ORAL_TABLET | Freq: Every day | ORAL | 3 refills | Status: DC

## 2017-10-03 MED ORDER — VARENICLINE TARTRATE 1 MG PO TABS: 1.0000 mg | ORAL_TABLET | Freq: Two times a day (BID) | ORAL | 2 refills | Status: DC

## 2017-10-03 NOTE — Patient Instructions (Signed)

## 2017-10-03 NOTE — Assessment & Plan Note (Signed)
con't meds Lab Results  Component Value Date   TSH 1.44 08/12/2017

## 2017-10-03 NOTE — Progress Notes (Signed)
Patient ID: Debra [name], female   DOB: Aug 14, 1956, 61 y.o.   MRN: [id number]    Subjective:  I acted as a Education administrator for Dr. Carollee Herter.  Debra [name], Debra [name]   Patient ID: Debra [name], female    DOB: October 28, 1956, 61 y.o.   MRN: [id number]  Chief Complaint  Patient presents with  . ADD    HPI  Patient is in today for follow up ADD.  She started strattera one month ago.  She has a running nose and is not sure if the medication has anything to do with it.  Patient Care Team: Carollee Herter, Alferd Apa, DO as PCP - General Leeroy Cha, MD as Consulting Physician (Neurosurgery) Chesley Mires, MD as Consulting Physician (Pulmonary Disease)   Past Medical History:  Diagnosis Date  . Anxiety   . DDD (degenerative disc disease)   . GERD (gastroesophageal reflux disease)   . 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 Thyroid disease    Hypothyroidism    Past Surgical History:  Procedure Laterality Date  . CARPAL TUNNEL RELEASE     Bilateral  . CERVICAL SPINE SURGERY     x3  . LEG SURGERY     Right   . WRIST FRACTURE SURGERY  10-02-15   Pt have surgery twice on the same wrist.    Family History  Problem Relation Age of Onset  . Breast cancer Mother   . Arthritis Mother        rheumatoid  . Cancer Mother        breast  . Heart disease Mother 60       MI  . Heart disease Father        MI  . Hypertension Father   . Stroke Father   . Diabetes Sister   . Hypertension Sister   . Hyperlipidemia Sister   . Colon cancer Maternal Grandfather     Social History   Socioeconomic History  . Marital status: Legally Separated    Spouse name: Not on file  . Number of children: 3  . Years of education: Not on file  . Highest education level: Not on file  Social Needs  . Financial resource strain: Not on file  . Food insecurity - worry: Not on file  . Food insecurity - inability: Not on file  . Transportation needs - medical: Not on file  . Transportation needs -  non-medical: Not on file  Occupational History  . Occupation: SECURITY GUARD    Employer: [employer]  Tobacco Use  . Smoking status: Current Every Day Smoker    Packs/day: 0.75    Years: 44.00    Pack years: 33.00    Types: Cigarettes  . Smokeless tobacco: Never Used  . Tobacco comment: Started Chantix 08/24/2017  Substance and Sexual Activity  . Alcohol use: Yes    Alcohol/week: 0.0 oz    Comment: 2-3 drinks per week (beer/wine)  . Drug use: No  . Sexual activity: Yes    Partners: Male    Birth control/protection: Post-menopausal  Other Topics Concern  . Not on file  Social History Narrative  . Not on file    Outpatient Medications Prior to Visit  Medication Sig Dispense Refill  . cyclobenzaprine (FLEXERIL) 10 MG tablet Take 1 tablet (10 mg total) by mouth at bedtime as needed for muscle spasms. 21 tablet 0  . ferrous sulfate (SLOW FE) 160 (50 Fe) MG TBCR SR tablet Take 1 tablet (  160 mg total) by mouth daily. 90 each 3  . gabapentin (NEURONTIN) 600 MG tablet Take 1 tablet (600 mg total) by mouth 3 (three) times daily. 270 tablet 3  . losartan (COZAAR) 100 MG tablet Take 1 tablet (100 mg total) by mouth daily. 90 tablet 3  . metoprolol succinate (TOPROL-XL) 50 MG 24 hr tablet TAKE ONE TABLET BY MOUTH TWICE DAILY 180 tablet 1  . NON FORMULARY [facility]  Scar Cream -  Verapamil 10%, Pentoxifylline 5% Apply 1-2 grams to affected area 3-4 times daily Qty. 120 gm 3 refills    . Omega-3 Fatty Acids (FISH OIL PO) Take 1 tablet by mouth as needed. Reported on 03/12/2016    . omeprazole (PRILOSEC) 40 MG capsule Take 1 capsule (40 mg total) by mouth daily. 90 capsule 3  . PARoxetine (PAXIL) 30 MG tablet Take 1 tablet (30 mg total) by mouth daily. 90 tablet 1  . pravastatin (PRAVACHOL) 20 MG tablet Take 1 tablet (20 mg total) by mouth every evening. 90 tablet 1  . atomoxetine (STRATTERA) 40 MG capsule Take 1 capsule (40 mg total) by mouth 2 (two) times daily. 60  capsule 2  . levothyroxine (SYNTHROID, LEVOTHROID) 50 MCG tablet Take 1 tablet (50 mcg total) by mouth daily. 90 tablet 3  . varenicline (CHANTIX CONTINUING MONTH PAK) 1 MG tablet Take 1 tablet (1 mg total) by mouth 2 (two) times daily. 60 tablet 2   No facility-administered medications prior to visit.     Allergies  Allergen Reactions  . Penicillins   . Codeine Rash and Other (See Comments)    dizziness    ROS     Objective:    Physical Exam  Constitutional: She is oriented to person, place, and time. She appears well-developed and well-nourished.  HENT:  Head: Normocephalic and atraumatic.  Eyes: Conjunctivae and EOM are normal.  Neck: Normal range of motion. Neck supple. No JVD present. Carotid bruit is not present. No thyromegaly present.  Cardiovascular: Normal rate, regular rhythm and normal heart sounds.  No murmur heard. Pulmonary/Chest: Effort normal and breath sounds normal. No respiratory distress. She has no wheezes. She has no rales. She exhibits no tenderness.  Musculoskeletal: She exhibits no edema.  Neurological: She is alert and oriented to person, place, and time.  Psychiatric: Her speech is normal and behavior is normal. Judgment and thought content normal. Cognition and memory are normal. She exhibits a depressed mood.  Nursing note and vitals reviewed.   BP (!) 147/87   Pulse 85   Temp 98.3 F (36.8 C) (Oral)   Resp 16   Ht 5\' 4"  (1.626 m)   Wt 167 lb 9.6 oz (76 kg)   SpO2 100%   BMI 28.77 kg/m  Wt Readings from Last 3 Encounters:  10/03/17 167 lb 9.6 oz (76 kg)  08/29/17 161 lb 12.8 oz (73.4 kg)  08/12/17 158 lb (71.7 kg)   BP Readings from Last 3 Encounters:  10/03/17 (!) 147/87  08/29/17 (!) 144/71  08/12/17 140/90     Immunization History  Administered Date(s) Administered  . Influenza Whole 10/01/2007, 08/31/2010  . Influenza,inj,Quad PF,6+ Mos 10/12/2013, 08/29/2015, 08/13/2016, 08/12/2017  . Td 11/02/2010  . Zoster 03/12/2016    . Zoster Recombinat (Shingrix) 04/17/2017, 06/17/2017    Health Maintenance  Topic Date Due  . MAMMOGRAM  04/09/2017  . PAP SMEAR  02/20/2018  . DEXA SCAN  04/09/2019  . COLONOSCOPY  07/28/2019  . TETANUS/TDAP  11/02/2020  .  INFLUENZA VACCINE  Completed  . Hepatitis C Screening  Completed  . HIV Screening  Completed    Lab Results  Component Value Date   WBC 7.2 08/12/2017   HGB 12.5 08/12/2017   HCT 37.8 08/12/2017   PLT 263.0 08/12/2017   GLUCOSE 93 08/12/2017   CHOL 191 08/12/2017   TRIG 91.0 08/12/2017   HDL 65.50 08/12/2017   LDLDIRECT 126.3 06/06/2011   LDLCALC 107 (H) 08/12/2017   ALT 15 08/12/2017   AST 23 08/12/2017   NA 141 08/12/2017   K 4.9 08/12/2017   CL 106 08/12/2017   CREATININE 0.67 08/12/2017   BUN 13 08/12/2017   CO2 28 08/12/2017   TSH 1.44 08/12/2017   HGBA1C 6.3 03/12/2016   MICROALBUR 3.1 (H) 11/07/2014    Lab Results  Component Value Date   TSH 1.44 08/12/2017   Lab Results  Component Value Date   WBC 7.2 08/12/2017   HGB 12.5 08/12/2017   HCT 37.8 08/12/2017   MCV 84.6 08/12/2017   PLT 263.0 08/12/2017   Lab Results  Component Value Date   NA 141 08/12/2017   K 4.9 08/12/2017   CO2 28 08/12/2017   GLUCOSE 93 08/12/2017   BUN 13 08/12/2017   CREATININE 0.67 08/12/2017   BILITOT 0.5 08/12/2017   ALKPHOS 88 08/12/2017   AST 23 08/12/2017   ALT 15 08/12/2017   PROT 8.3 08/12/2017   ALBUMIN 4.3 08/12/2017   CALCIUM 10.1 08/12/2017   ANIONGAP 11 08/31/2014   GFR 114.68 08/12/2017   Lab Results  Component Value Date   CHOL 191 08/12/2017   Lab Results  Component Value Date   HDL 65.50 08/12/2017   Lab Results  Component Value Date   LDLCALC 107 (H) 08/12/2017   Lab Results  Component Value Date   TRIG 91.0 08/12/2017   Lab Results  Component Value Date   CHOLHDL 3 08/12/2017   Lab Results  Component Value Date   HGBA1C 6.3 03/12/2016         Assessment & Plan:   Problem List Items Addressed This  Visit      Unprioritized   Attention and concentration deficit    Inc straterra to 100 mg Gave pt numbers for counselors for add/ depression      GERD   Relevant Medications   ranitidine (ZANTAC) 150 MG capsule   Hypothyroidism    con't meds Lab Results  Component Value Date   TSH 1.44 08/12/2017         Relevant Medications   levothyroxine (SYNTHROID, LEVOTHROID) 50 MCG tablet   TOBACCO USER   Relevant Medications   varenicline (CHANTIX CONTINUING MONTH PAK) 1 MG tablet    Other Visit Diagnoses    Attention deficit    -  Primary   Relevant Medications   atomoxetine (STRATTERA) 100 MG capsule      I have discontinued Kayra R. Distel's atomoxetine. I am also having her start on atomoxetine and ranitidine. Additionally, I am having her maintain her Omega-3 Fatty Acids (FISH OIL PO), cyclobenzaprine, NON FORMULARY, pravastatin, PARoxetine, omeprazole, metoprolol succinate, losartan, gabapentin, ferrous sulfate, levothyroxine, and varenicline.  Meds ordered this encounter  Medications  . levothyroxine (SYNTHROID, LEVOTHROID) 50 MCG tablet    Sig: Take 1 tablet (50 mcg total) by mouth daily.    Dispense:  90 tablet    Refill:  3    Please consider 90 day supplies to promote better adherence  . atomoxetine (STRATTERA) 100 MG capsule  Sig: Take 1 capsule (100 mg total) by mouth daily.    Dispense:  90 capsule    Refill:  1  . varenicline (CHANTIX CONTINUING MONTH PAK) 1 MG tablet    Sig: Take 1 tablet (1 mg total) by mouth 2 (two) times daily.    Dispense:  60 tablet    Refill:  2  . ranitidine (ZANTAC) 150 MG capsule    Sig: Take 1 capsule (150 mg total) by mouth 2 (two) times daily.    Dispense:  60 capsule    Refill:  5    CMA served as scribe during this visit. History, Physical and Plan performed by medical provider. Documentation and orders reviewed and attested to.  Ann Held, DO

## 2017-10-03 NOTE — Assessment & Plan Note (Signed)
Inc straterra to 100 mg Gave pt numbers for counselors for add/ depression

## 2017-10-08 ENCOUNTER — Ambulatory Visit: Payer: Self-pay

## 2017-10-08 NOTE — Telephone Encounter (Signed)
   Reason for Disposition . [1] MODERATE back pain (e.g., interferes with normal activities) AND [2] present > 3 days  Answer Assessment - Initial Assessment Questions 1. ONSET: "When did the pain begin?"      Started Sunday 2. LOCATION: "Where does it hurt?" (upper, mid or lower back)     Low back and into legs 3. SEVERITY: "How bad is the pain?"  (e.g., Scale 1-10; mild, moderate, or severe)   - MILD (1-3): doesn\'t interfere with normal activities    - MODERATE (4-7): interferes with normal activities or awakens from sleep    - SEVERE (8-10): excruciating pain, unable to do any normal activities      10  4. PATTERN: "Is the pain constant?" (e.g., yes, no; constant, intermittent)      Constant 5. RADIATION: "Does the pain shoot into your legs or elsewhere?"     Yes - hurts into thigh area. 6. CAUSE:  "What do you think is causing the back pain?"      Had back surgery years ago. 7. BACK OVERUSE:  "Any recent lifting of heavy objects, strenuous work or exercise?"     No 8. MEDICATIONS: "What have you taken so far for the pain?" (e.g., nothing, acetaminophen, NSAIDS)     Topical OTC rubs; Tylenol 9. NEUROLOGIC SYMPTOMS: "Do you have any weakness, numbness, or problems with bowel/bladder control?"     No 10. OTHER SYMPTOMS: "Do you have any other symptoms?" (e.g., fever, abdominal pain, burning with urination, blood in urine)       No 11. PREGNANCY: "Is there any chance you are pregnant?" (e.g., yes, no; LMP)       No  Protocols used: BACK PAIN-A-AH

## 2017-10-08 NOTE — Telephone Encounter (Signed)
Pt. Thinks the weather has "aggrivated " her back. Appointment for tomorrow, but requests something for pain be called in.

## 2017-10-09 ENCOUNTER — Ambulatory Visit (HOSPITAL_BASED_OUTPATIENT_CLINIC_OR_DEPARTMENT_OTHER)
Admission: RE | Admit: 2017-10-09 | Discharge: 2017-10-09 | Disposition: A | Discharge status: Home / Self Care | Payer: BLUE CROSS/BLUE SHIELD | Source: Ambulatory Visit | Attending: Family Medicine | Admitting: Family Medicine

## 2017-10-09 ENCOUNTER — Encounter: Payer: Self-pay | Admitting: Family Medicine

## 2017-10-09 ENCOUNTER — Ambulatory Visit (INDEPENDENT_AMBULATORY_CARE_PROVIDER_SITE_OTHER): Payer: BLUE CROSS/BLUE SHIELD | Admitting: Family Medicine

## 2017-10-09 VITALS — BP 145/88 | HR 75 | Temp 97.9°F | Resp 16 | Ht 64.0 in | Wt 161.4 lb

## 2017-10-09 DIAGNOSIS — M545 Low back pain: Secondary | ICD-10-CM | POA: Diagnosis not present

## 2017-10-09 DIAGNOSIS — I700 Atherosclerosis of aorta: Secondary | ICD-10-CM | POA: Diagnosis not present

## 2017-10-09 DIAGNOSIS — M5441 Lumbago with sciatica, right side: Secondary | ICD-10-CM | POA: Diagnosis not present

## 2017-10-09 DIAGNOSIS — M5136 Other intervertebral disc degeneration, lumbar region: Secondary | ICD-10-CM | POA: Insufficient documentation

## 2017-10-09 DIAGNOSIS — Z79899 Other long term (current) drug therapy: Secondary | ICD-10-CM

## 2017-10-09 MED ORDER — HYDROCODONE-ACETAMINOPHEN 5-325 MG PO TABS: 1.0000 | ORAL_TABLET | Freq: Four times a day (QID) | ORAL | 0 refills | Status: DC | PRN

## 2017-10-09 MED ORDER — CYCLOBENZAPRINE HCL 10 MG PO TABS: 10.0000 mg | ORAL_TABLET | Freq: Every evening | ORAL | 0 refills | Status: DC | PRN

## 2017-10-09 NOTE — Telephone Encounter (Signed)
Patient was seen in office today.  

## 2017-10-09 NOTE — Progress Notes (Signed)
Patient ID: Debra [name], female   DOB: Jun 04, 1956, 61 y.o.   MRN: [id number]    Subjective:  I acted as a Education administrator for Dr. Carollee Herter.  [name], [place]   Patient ID: Debra [name], female    DOB: [birth year], 61 y.o.   MRN: [id number]  Chief Complaint  Patient presents with  . Back Pain    Back Pain  This is a new problem. Episode onset: satuday. The problem occurs constantly. The problem has been rapidly worsening since onset. The quality of the pain is described as aching, burning, shooting and stabbing. The pain radiates to the right thigh and right knee. The symptoms are aggravated by bending, lying down, sitting, position, standing and twisting. Associated symptoms include abdominal pain (low abdominal pain), leg pain, pelvic pain and weakness (right leg). Pertinent negatives include no bladder incontinence, bowel incontinence, chest pain, dysuria, fever, headaches, numbness, paresis, paresthesias, perianal numbness or tingling. She has tried heat, NSAIDs and analgesics (hot bath, tylenol and ibuprofen) for the symptoms.    Patient is in today for low back pain.  Patient Care Team: Carollee Herter, Alferd Apa, DO as PCP - General Leeroy Cha, MD as Consulting Physician (Neurosurgery) Chesley Mires, MD as Consulting Physician (Pulmonary Disease)   Past Medical History:  Diagnosis Date  . Anxiety   . DDD (degenerative disc disease)   . GERD (gastroesophageal reflux disease)   . 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 Thyroid disease    Hypothyroidism    Past Surgical History:  Procedure Laterality Date  . CARPAL TUNNEL RELEASE     Bilateral  . CERVICAL SPINE SURGERY     x3  . LEG SURGERY     Right   . WRIST FRACTURE SURGERY  10-02-15   Pt have surgery twice on the same wrist.    Family History  Problem Relation Age of Onset  . Breast cancer Mother   . Arthritis Mother        rheumatoid  . Cancer Mother        breast  . Heart disease Mother 37     MI  . Heart disease Father        MI  . Hypertension Father   . Stroke Father   . Diabetes Sister   . Hypertension Sister   . Hyperlipidemia Sister   . Colon cancer Maternal Grandfather     Social History   Socioeconomic History  . Marital status: Legally Separated    Spouse name: Not on file  . Number of children: 3  . Years of education: Not on file  . Highest education level: Not on file  Social Needs  . Financial resource strain: Not on file  . Food insecurity - worry: Not on file  . Food insecurity - inability: Not on file  . Transportation needs - medical: Not on file  . Transportation needs - non-medical: Not on file  Occupational History  . Occupation: SECURITY GUARD    Employer: [employer]  Tobacco Use  . Smoking status: Current Every Day Smoker    Packs/day: 0.75    Years: 44.00    Pack years: 33.00    Types: Cigarettes  . Smokeless tobacco: Never Used  . Tobacco comment: Started Chantix 08/24/2017  Substance and Sexual Activity  . Alcohol use: Yes    Alcohol/week: 0.0 oz    Comment: 2-3 drinks per week (beer/wine)  . Drug use: No  . Sexual activity: Yes  Partners: Male    Birth control/protection: Post-menopausal  Other Topics Concern  . Not on file  Social History Narrative  . Not on file    Outpatient Medications Prior to Visit  Medication Sig Dispense Refill  . atomoxetine (STRATTERA) 100 MG capsule Take 1 capsule (100 mg total) by mouth daily. 90 capsule 1  . ferrous sulfate (SLOW FE) 160 (50 Fe) MG TBCR SR tablet Take 1 tablet (160 mg total) by mouth daily. 90 each 3  . gabapentin (NEURONTIN) 600 MG tablet Take 1 tablet (600 mg total) by mouth 3 (three) times daily. 270 tablet 3  . levothyroxine (SYNTHROID, LEVOTHROID) 50 MCG tablet Take 1 tablet (50 mcg total) by mouth daily. 90 tablet 3  . losartan (COZAAR) 100 MG tablet Take 1 tablet (100 mg total) by mouth daily. 90 tablet 3  . metoprolol succinate (TOPROL-XL) 50 MG 24 hr  tablet TAKE ONE TABLET BY MOUTH TWICE DAILY 180 tablet 1  . NON FORMULARY [facility]  Scar Cream -  Verapamil 10%, Pentoxifylline 5% Apply 1-2 grams to affected area 3-4 times daily Qty. 120 gm 3 refills    . Omega-3 Fatty Acids (FISH OIL PO) Take 1 tablet by mouth as needed. Reported on 03/12/2016    . omeprazole (PRILOSEC) 40 MG capsule Take 1 capsule (40 mg total) by mouth daily. 90 capsule 3  . PARoxetine (PAXIL) 30 MG tablet Take 1 tablet (30 mg total) by mouth daily. 90 tablet 1  . pravastatin (PRAVACHOL) 20 MG tablet Take 1 tablet (20 mg total) by mouth every evening. 90 tablet 1  . ranitidine (ZANTAC) 150 MG capsule Take 1 capsule (150 mg total) by mouth 2 (two) times daily. 60 capsule 5  . varenicline (CHANTIX CONTINUING MONTH PAK) 1 MG tablet Take 1 tablet (1 mg total) by mouth 2 (two) times daily. 60 tablet 2  . cyclobenzaprine (FLEXERIL) 10 MG tablet Take 1 tablet (10 mg total) by mouth at bedtime as needed for muscle spasms. 21 tablet 0   No facility-administered medications prior to visit.     Allergies  Allergen Reactions  . Penicillins   . Codeine Rash and Other (See Comments)    dizziness    Review of Systems  Constitutional: Negative for fever and malaise/fatigue.  HENT: Negative for congestion.   Eyes: Negative for blurred vision.  Respiratory: Negative for cough and shortness of breath.   Cardiovascular: Negative for chest pain, palpitations and leg swelling.  Gastrointestinal: Positive for abdominal pain (low abdominal pain). Negative for bowel incontinence and vomiting.  Genitourinary: Positive for pelvic pain. Negative for bladder incontinence and dysuria.  Musculoskeletal: Positive for back pain.  Skin: Negative for rash.  Neurological: Positive for weakness (right leg). Negative for tingling, loss of consciousness, numbness, headaches and paresthesias.       Objective:    Physical Exam  Constitutional: She is oriented to person, place, and  time. She appears well-developed and well-nourished.  HENT:  Head: Normocephalic and atraumatic.  Eyes: Conjunctivae and EOM are normal.  Neck: Normal range of motion. Neck supple. No JVD present. Carotid bruit is not present. No thyromegaly present.  Cardiovascular: Normal rate, regular rhythm and normal heart sounds.  No murmur heard. Pulmonary/Chest: Effort normal and breath sounds normal. No respiratory distress. She has no wheezes. She has no rales. She exhibits no tenderness.  Musculoskeletal: She exhibits tenderness. She exhibits no edema.       Right hip: She exhibits decreased range of motion, decreased strength  and tenderness.       Arms:      Legs: Neurological: She is alert and oriented to person, place, and time.  Psychiatric: She has a normal mood and affect.  Nursing note and vitals reviewed.   BP (!) 145/88   Pulse 75   Temp 97.9 F (36.6 C) (Oral)   Resp 16   Ht 5\' 4"  (1.626 m)   Wt 161 lb 6.4 oz (73.2 kg)   SpO2 98%   BMI 27.70 kg/m  Wt Readings from Last 3 Encounters:  10/09/17 161 lb 6.4 oz (73.2 kg)  10/03/17 167 lb 9.6 oz (76 kg)  08/29/17 161 lb 12.8 oz (73.4 kg)   BP Readings from Last 3 Encounters:  10/09/17 (!) 145/88  10/03/17 (!) 147/87  08/29/17 (!) 144/71     Immunization History  Administered Date(s) Administered  . Influenza Whole 10/01/2007, 08/31/2010  . Influenza,inj,Quad PF,6+ Mos 10/12/2013, 08/29/2015, 08/13/2016, 08/12/2017  . Td 11/02/2010  . Zoster 03/12/2016  . Zoster Recombinat (Shingrix) 04/17/2017, 06/17/2017    Health Maintenance  Topic Date Due  . MAMMOGRAM  04/09/2017  . PAP SMEAR  02/20/2018  . DEXA SCAN  04/09/2019  . COLONOSCOPY  07/28/2019  . TETANUS/TDAP  11/02/2020  . INFLUENZA VACCINE  Completed  . Hepatitis C Screening  Completed  . HIV Screening  Completed    Lab Results  Component Value Date   WBC 7.2 08/12/2017   HGB 12.5 08/12/2017   HCT 37.8 08/12/2017   PLT 263.0 08/12/2017   GLUCOSE 93  08/12/2017   CHOL 191 08/12/2017   TRIG 91.0 08/12/2017   HDL 65.50 08/12/2017   LDLDIRECT 126.3 06/06/2011   LDLCALC 107 (H) 08/12/2017   ALT 15 08/12/2017   AST 23 08/12/2017   NA 141 08/12/2017   K 4.9 08/12/2017   CL 106 08/12/2017   CREATININE 0.67 08/12/2017   BUN 13 08/12/2017   CO2 28 08/12/2017   TSH 1.44 08/12/2017   HGBA1C 6.3 03/12/2016   MICROALBUR 3.1 (H) 11/07/2014    Lab Results  Component Value Date   TSH 1.44 08/12/2017   Lab Results  Component Value Date   WBC 7.2 08/12/2017   HGB 12.5 08/12/2017   HCT 37.8 08/12/2017   MCV 84.6 08/12/2017   PLT 263.0 08/12/2017   Lab Results  Component Value Date   NA 141 08/12/2017   K 4.9 08/12/2017   CO2 28 08/12/2017   GLUCOSE 93 08/12/2017   BUN 13 08/12/2017   CREATININE 0.67 08/12/2017   BILITOT 0.5 08/12/2017   ALKPHOS 88 08/12/2017   AST 23 08/12/2017   ALT 15 08/12/2017   PROT 8.3 08/12/2017   ALBUMIN 4.3 08/12/2017   CALCIUM 10.1 08/12/2017   ANIONGAP 11 08/31/2014   GFR 114.68 08/12/2017   Lab Results  Component Value Date   CHOL 191 08/12/2017   Lab Results  Component Value Date   HDL 65.50 08/12/2017   Lab Results  Component Value Date   LDLCALC 107 (H) 08/12/2017   Lab Results  Component Value Date   TRIG 91.0 08/12/2017   Lab Results  Component Value Date   CHOLHDL 3 08/12/2017   Lab Results  Component Value Date   HGBA1C 6.3 03/12/2016         Assessment & Plan:   Problem List Items Addressed This Visit      Unprioritized   Low back pain - Primary   Relevant Medications   cyclobenzaprine (FLEXERIL) 10  MG tablet   HYDROcodone-acetaminophen (NORCO/VICODIN) 5-325 MG tablet   Other Relevant Orders   DG Lumbar Spine Complete   Pain Mgmt, Profile 8 w/Conf, U    Other Visit Diagnoses    High risk medication use       Relevant Orders   Pain Mgmt, Profile 8 w/Conf, U      I am having [name] start on HYDROcodone-acetaminophen. I am also having her  maintain her Omega-3 Fatty Acids (FISH OIL PO), NON FORMULARY, pravastatin, PARoxetine, omeprazole, metoprolol succinate, losartan, gabapentin, ferrous sulfate, levothyroxine, atomoxetine, varenicline, ranitidine, and cyclobenzaprine.  Meds ordered this encounter  Medications  . cyclobenzaprine (FLEXERIL) 10 MG tablet    Sig: Take 1 tablet (10 mg total) by mouth at bedtime as needed for muscle spasms.    Dispense:  21 tablet    Refill:  0  . HYDROcodone-acetaminophen (NORCO/VICODIN) 5-325 MG tablet    Sig: Take 1 tablet by mouth every 6 (six) hours as needed for moderate pain.    Dispense:  20 tablet    Refill:  0    CMA served as scribe during this visit. History, Physical and Plan performed by medical provider. Documentation and orders reviewed and attested to.  Ann Held, DO

## 2017-10-09 NOTE — Patient Instructions (Signed)

## 2017-10-10 ENCOUNTER — Encounter: Payer: Self-pay | Admitting: Family Medicine

## 2017-10-10 DIAGNOSIS — I700 Atherosclerosis of aorta: Secondary | ICD-10-CM

## 2017-10-10 HISTORY — DX: Atherosclerosis of aorta: I70.0

## 2017-10-10 LAB — PAIN MGMT, PROFILE 8 W/CONF, U
6 ACETYLMORPHINE: NEGATIVE ng/mL (ref <10)
Alcohol Metabolites: NEGATIVE ng/mL (ref <500)
Amphetamines: NEGATIVE ng/mL (ref <500)
Benzodiazepines: NEGATIVE ng/mL (ref <100)
Buprenorphine, Urine: NEGATIVE ng/mL (ref <5)
COCAINE METABOLITE: NEGATIVE ng/mL (ref <150)
CREATININE: 121 mg/dL
MARIJUANA METABOLITE: NEGATIVE ng/mL (ref <20)
MDMA: NEGATIVE ng/mL (ref <500)
OPIATES: NEGATIVE ng/mL (ref <100)
Oxidant: NEGATIVE ug/mL (ref <200)
Oxycodone: NEGATIVE ng/mL (ref <100)
PH: 6.77 (ref 4.5–9.0)

## 2017-10-13 ENCOUNTER — Telehealth: Payer: Self-pay | Admitting: *Deleted

## 2017-10-13 NOTE — Telephone Encounter (Signed)
Copied from [place]. Topic: Quick Communication - See Telephone Encounter >> Oct 10, 2017  3:43 PM Antonieta Iba C wrote: CRM for notification. See Telephone encounter for: 10/10/17.   Pt called in to request a call back. Asked pt concern to make provider and CMA aware, pt says that she would just like a call back    CB: [phone] >> Oct 10, 2017  4:25 PM Bunnie Domino, LPN wrote: r Follow up call made to patient. States she look at her XR results in My Chart but she cannot tell what her problem is. States she had taken thre doses of the medication prescribed to her but it only makes her sleepy and does not ease the pain totally. Would like to know results of XR and oif she can have another medication.

## 2017-10-13 NOTE — Telephone Encounter (Signed)
Copied from [place]. Topic: Quick Communication - See Telephone Encounter >> Oct 10, 2017  3:43 PM Antonieta Iba C wrote: CRM for notification. See Telephone encounter for: 10/10/17.   Pt called in to request a call back. Asked pt concern to make provider and CMA aware, pt says that she would just like a call back    CB: [phone] >> Oct 10, 2017  4:25 PM Bunnie Domino, LPN wrote: r Follow up call made to patient. States she look at her XR results in My Chart but she cannot tell what her problem is. States she had taken thre doses of the medication prescribed to her but it only makes her sleepy and does not ease the pain totally. Would like to know results of XR and oif she can have another medication.

## 2017-10-13 NOTE — Telephone Encounter (Signed)
MRI ls spine no contrast  Refer to PT  She is on a strong pain med --- if that makes her sleepy anything stronger will be worse Can refer to ortho

## 2017-10-13 NOTE — Telephone Encounter (Signed)
Patient states that she is in pain.  The pain medication makes her real sleepy and takes it as needed.  Advised of results of xray.  Is there something else she can do?  She has not been out of the house.

## 2017-10-14 ENCOUNTER — Other Ambulatory Visit: Payer: Self-pay | Admitting: Family Medicine

## 2017-10-14 DIAGNOSIS — M545 Low back pain, unspecified: Secondary | ICD-10-CM

## 2017-10-14 DIAGNOSIS — M5440 Lumbago with sciatica, unspecified side: Principal | ICD-10-CM

## 2017-10-14 MED ORDER — PREDNISONE 10 MG PO TABS: ORAL_TABLET | ORAL | 0 refills | Status: DC

## 2017-10-14 NOTE — Telephone Encounter (Signed)
Patient will try the prednisone taper

## 2017-10-14 NOTE — Telephone Encounter (Signed)
Sent to [facility] ?

## 2017-10-15 DIAGNOSIS — H40023 Open angle with borderline findings, high risk, bilateral: Secondary | ICD-10-CM | POA: Diagnosis not present

## 2017-10-16 ENCOUNTER — Other Ambulatory Visit: Payer: Self-pay | Admitting: Family Medicine

## 2017-10-16 DIAGNOSIS — Z1231 Encounter for screening mammogram for malignant neoplasm of breast: Secondary | ICD-10-CM

## 2017-10-17 ENCOUNTER — Telehealth: Payer: Self-pay | Admitting: Family Medicine

## 2017-10-17 NOTE — Telephone Encounter (Signed)
Copied from [place] [id number]. Topic: Quick Communication - See Telephone Encounter >> Oct 17, 2017  4:49 PM Bea Graff, NT wrote: CRM for notification. See Telephone encounter for: Pt calling and states she is unable to get the atomoxetine at this time and wants to see what else she can do until she can afford the medicine. She is completely out of this medication.   10/17/17.

## 2017-10-20 NOTE — Telephone Encounter (Signed)
That is the only meds of its kind --- check [email or web address] for cheapest pharmacy  Check [email or web address]

## 2017-10-22 NOTE — Telephone Encounter (Signed)
Called Pt to inform her of the goodRx price and to inform her of coupon I've printed out for her to pick up to see if it help her much with the cost of atmoxetine. Pt did not answer. Left VM  For her to call the office back.

## 2017-10-22 NOTE — Telephone Encounter (Signed)
GoodRx has the lowest price os 92.26 for atomoxetine and [employer] has a Editor, commissioning [employer] off for Pt. It is unclear exactly how much Pt will save but the website states that it'll be "little to no cost out of pocket or as low as $25". Will call and advise Pt.

## 2017-10-22 NOTE — Telephone Encounter (Signed)
Called pharmacy and wasn't able to cut thet cost any lower than 92.26. After speaking with pharmacist for several minutes we both agreed it migh be beneficial for the Pt to enroll in the manufactures assistance. Called Pt and advised her of all of the above and she stated that she will call [facility] now to see what can be done to help with Rx cost.

## 2017-10-22 NOTE — Telephone Encounter (Signed)
Patient returned telephone call.  You can reach her at [phone]

## 2017-11-14 ENCOUNTER — Ambulatory Visit
Admission: RE | Admit: 2017-11-14 | Discharge: 2017-11-14 | Disposition: A | Discharge status: Home / Self Care | Payer: BLUE CROSS/BLUE SHIELD | Source: Ambulatory Visit | Attending: Family Medicine | Admitting: Family Medicine

## 2017-11-14 ENCOUNTER — Ambulatory Visit: Payer: Self-pay

## 2017-11-14 DIAGNOSIS — Z1231 Encounter for screening mammogram for malignant neoplasm of breast: Secondary | ICD-10-CM

## 2017-11-20 ENCOUNTER — Telehealth: Payer: Self-pay | Admitting: Family Medicine

## 2017-11-20 NOTE — Telephone Encounter (Signed)
Copied from [place] [phone]. Topic: Quick Communication - See Telephone Encounter >> Nov 20, 2017  3:18 PM Burnis Medin, NT wrote: CRM for notification. See Telephone encounter for: Patient calling to see if the doctor or nurse could give her a call regarding getting a note. Patient didn't want to give me anymore information.  11/20/17.

## 2017-11-24 ENCOUNTER — Ambulatory Visit: Payer: Self-pay | Admitting: *Deleted

## 2017-11-24 NOTE — Telephone Encounter (Signed)
Pt  Reports  Runny  Nose  /    Sneezing  For over  1  Month      Pt reports  otc  meds   Are  Making  Her  Congested     Pt  Reports  She  Is  Trying  Nasal  Spray   .  speeking in  Complete  sentances  And    Sounds  In no  Severe  Distress     appt  Made  tommorow   With [place]   Reason for Disposition . [1] Taking antihistamines > 2 days AND [2] nasal allergy symptoms interfere with sleep, school, or work  Answer Assessment - Initial Assessment Questions 1. SYMPTOM: "What's the main symptom you're concerned about?" (e.g., runny nose, stuffiness, sneezing, itching)     Runny  Nose     Sneezing    X   1   Month   2. SEVERITY: "How bad is it?" "What does it keep you from doing?" (e.g., sleeping, working)        Red   And irritated   Nose    Severe    3. EYES: "Are the eyes also red, watery, and itchy?"        Red   Watery  Itchy     4. TRIGGER: "What pollen or other allergic substance do you think is causing the symptoms?"        Pt  Has  A  History   Of  Allergies     5. TREATMENT: "What medicine are you using?" "What medicine worked best in the past?"        Coricedin     6. OTHER SYMPTOMS: "Do you have any other symptoms?" (e.g., coughing, difficulty breathing, wheezing)           No   Coughing    Congested    7. PREGNANCY: "Is there any chance you are pregnant?" "When was your last menstrual period?"          N/A  Protocols used: NASAL ALLERGIES (HAY FEVER)-A-AH

## 2017-11-24 NOTE — Telephone Encounter (Signed)
Left message on machine for patient to call back with more information.  What is the note needed for?

## 2017-11-25 ENCOUNTER — Ambulatory Visit: Payer: BLUE CROSS/BLUE SHIELD | Admitting: Medical

## 2017-11-25 ENCOUNTER — Encounter: Payer: Self-pay | Admitting: Medical

## 2017-11-25 VITALS — BP 150/80 | HR 80 | Temp 98.0°F | Resp 16 | Ht 64.0 in | Wt 177.8 lb

## 2017-11-25 DIAGNOSIS — R0981 Nasal congestion: Secondary | ICD-10-CM

## 2017-11-25 MED ORDER — AZELASTINE HCL 0.1 % NA SOLN: 2.0000 | Freq: Two times a day (BID) | NASAL | 2 refills | Status: DC

## 2017-11-25 MED ORDER — FLUTICASONE PROPIONATE 50 MCG/ACT NA SUSP: 2.0000 | Freq: Every day | NASAL | 1 refills | Status: DC

## 2017-11-25 NOTE — Progress Notes (Signed)
Subjective:    Patient ID: Debra [name], female    DOB: [birth year], 62 y.o.   MRN: [id number]  HPI  Pt had nasal congestion, runny nose on and off for a month. Pt describes on and off pattern. Pt has known nasal congestion and runny nose with rapid temp temperature changes.   Pt not reporting any sinus pressure.  No green or colored mucus when she blows her nose.  Pt has concern for maybe some mold in her basement. Someone came out and looked at and states nothing obvious. Then they mentioned to determine for sure she might need to see inside walls.  Pt has has using saline nasal sprays and corcidin.   Pt bp is up. She thinks took metoprolol. She thinks did not use losartan. Sometimes she skips losartan.   Review of Systems  Constitutional: Negative for appetite change, diaphoresis and fever.  HENT: Positive for congestion and sneezing. Negative for ear pain, postnasal drip, rhinorrhea, sinus pressure, sinus pain and sore throat.        Sneezing a lot on Monday.  Respiratory: Negative for cough, choking, shortness of breath and wheezing.   Cardiovascular: Negative for chest pain and palpitations.  Gastrointestinal: Negative for abdominal pain.  Musculoskeletal: Negative for back pain, gait problem, joint swelling and neck stiffness.  Skin: Negative for rash.  Neurological: Negative for dizziness, seizures, speech difficulty, weakness, light-headedness, numbness and headaches.  Hematological: Negative for adenopathy.  Psychiatric/Behavioral: Negative for behavioral problems and confusion.   Past Medical History:  Diagnosis Date  . Anxiety   . DDD (degenerative disc disease)   . GERD (gastroesophageal reflux disease)   . 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 Thyroid disease    Hypothyroidism     Social History   Socioeconomic History  . Marital status: Legally Separated    Spouse name: Not on file  . Number of children: 3  . Years of education:  Not on file  . Highest education level: Not on file  Social Needs  . Financial resource strain: Not on file  . Food insecurity - worry: Not on file  . Food insecurity - inability: Not on file  . Transportation needs - medical: Not on file  . Transportation needs - non-medical: Not on file  Occupational History  . Occupation: SECURITY GUARD    Employer: [employer]  Tobacco Use  . Smoking status: Current Every Day Smoker    Packs/day: 0.75    Years: 44.00    Pack years: 33.00    Types: Cigarettes  . Smokeless tobacco: Never Used  . Tobacco comment: Started Chantix 08/24/2017  Substance and Sexual Activity  . Alcohol use: Yes    Alcohol/week: 0.0 oz    Comment: 2-3 drinks per week (beer/wine)  . Drug use: No  . Sexual activity: Yes    Partners: Male    Birth control/protection: Post-menopausal  Other Topics Concern  . Not on file  Social History Narrative  . Not on file    Past Surgical History:  Procedure Laterality Date  . CARPAL TUNNEL RELEASE     Bilateral  . CERVICAL SPINE SURGERY     x3  . LEG SURGERY     Right   . WRIST FRACTURE SURGERY  10-02-15   Pt have surgery twice on the same wrist.    Family History  Problem Relation Age of Onset  . Breast cancer Mother  possibly 60's  . Arthritis Mother        rheumatoid  . Cancer Mother        breast  . Heart disease Mother 92       MI  . Heart disease Father        MI  . Hypertension Father   . Stroke Father   . Diabetes Sister   . Hypertension Sister   . Hyperlipidemia Sister   . Colon cancer Maternal Grandfather     Allergies  Allergen Reactions  . Penicillins   . Codeine Rash and Other (See Comments)    dizziness    Current Outpatient Medications on File Prior to Visit  Medication Sig Dispense Refill  . atomoxetine (STRATTERA) 100 MG capsule Take 1 capsule (100 mg total) by mouth daily. 90 capsule 1  . cyclobenzaprine (FLEXERIL) 10 MG tablet Take 1 tablet (10 mg total) by mouth  at bedtime as needed for muscle spasms. 21 tablet 0  . ferrous sulfate (SLOW FE) 160 (50 Fe) MG TBCR SR tablet Take 1 tablet (160 mg total) by mouth daily. 90 each 3  . gabapentin (NEURONTIN) 600 MG tablet Take 1 tablet (600 mg total) by mouth 3 (three) times daily. 270 tablet 3  . HYDROcodone-acetaminophen (NORCO/VICODIN) 5-325 MG tablet Take 1 tablet by mouth every 6 (six) hours as needed for moderate pain. 20 tablet 0  . levothyroxine (SYNTHROID, LEVOTHROID) 50 MCG tablet Take 1 tablet (50 mcg total) by mouth daily. 90 tablet 3  . losartan (COZAAR) 100 MG tablet Take 1 tablet (100 mg total) by mouth daily. 90 tablet 3  . metoprolol succinate (TOPROL-XL) 50 MG 24 hr tablet TAKE ONE TABLET BY MOUTH TWICE DAILY 180 tablet 1  . NON FORMULARY [facility]  Scar Cream -  Verapamil 10%, Pentoxifylline 5% Apply 1-2 grams to affected area 3-4 times daily Qty. 120 gm 3 refills    . Omega-3 Fatty Acids (FISH OIL PO) Take 1 tablet by mouth as needed. Reported on 03/12/2016    . omeprazole (PRILOSEC) 40 MG capsule Take 1 capsule (40 mg total) by mouth daily. 90 capsule 3  . PARoxetine (PAXIL) 30 MG tablet Take 1 tablet (30 mg total) by mouth daily. 90 tablet 1  . pravastatin (PRAVACHOL) 20 MG tablet Take 1 tablet (20 mg total) by mouth every evening. 90 tablet 1  . predniSONE (DELTASONE) 10 MG tablet TAKE 3 TABLETS PO QD FOR 3 DAYS THEN TAKE 2 TABLETS PO QD FOR 3 DAYS THEN TAKE 1 TABLET PO QD FOR 3 DAYS THEN TAKE 1/2 TAB PO QD FOR 3 DAYS 20 tablet 0  . ranitidine (ZANTAC) 150 MG capsule Take 1 capsule (150 mg total) by mouth 2 (two) times daily. 60 capsule 5  . varenicline (CHANTIX CONTINUING MONTH PAK) 1 MG tablet Take 1 tablet (1 mg total) by mouth 2 (two) times daily. 60 tablet 2   No current facility-administered medications on file prior to visit.     BP (!) 176/95   Pulse 80   Temp 98 F (36.7 C) (Oral)   Resp 16   Ht 5\' 4"  (1.626 m)   Wt 177 lb 12.8 oz (80.6 kg)   SpO2 97%   BMI  30.52 kg/m       Objective:   Physical Exam  General  Mental Status - Alert. General Appearance - Well groomed. Not in acute distress. Does sound nasal congested.  Skin Rashes- No Rashes.  HEENT Head- Normal. Ear  Auditory Canal - Left- Normal. Right - Normal.Tympanic Membrane- Left- Normal. Right- Normal. Eye Sclera/Conjunctiva- Left- Normal. Right- Normal. Nose & Sinuses Nasal Mucosa- Left-  Boggy and Congested. Right-  Boggy and  Congested.Bilateral maxillary and frontal sinus pressure. Mouth & Throat Lips: Upper Lip- Normal: no dryness, cracking, pallor, cyanosis, or vesicular eruption. Lower Lip-Normal: no dryness, cracking, pallor, cyanosis or vesicular eruption. Buccal Mucosa- Bilateral- No Aphthous ulcers. Oropharynx- No Discharge or Erythema. Tonsils: Characteristics- Bilateral- No Erythema or Congestion. Size/Enlargement- Bilateral- No enlargement. Discharge- bilateral-None.  Neck Neck- Supple. No Masses.   Chest and Lung Exam Auscultation: Breath Sounds:-Clear even and unlabored.  Cardiovascular Auscultation:Rythm- Regular, rate and rhythm. Murmurs & Other Heart Sounds:Ausculatation of the heart reveal- No Murmurs.  Lymphatic Head & Neck General Head & Neck Lymphatics: Bilateral: Description- No Localized lymphadenopathy.  Neuro-cranial nerves III through XII grossly intact.       Assessment & Plan:  You do have moderate to severe intermittent nasal congestion over the past month.  By your history and exam I am considering you have combination of allergic rhinitis and possibly some component of vasomotor rhinitis.  I do think it is a good idea to go ahead and start Flonase and Astelin nasal spray.  If your nasal congestion persist despite both medications then would consider Depo-Medrol 40 mg IM injection.  This is a steroid.  You have concerned about cost of this injection and you might touch base with your insurance on how they cover injections within  office visits.  Also you had one month of the nasal congestion but you do not have any sinus pressure or colored nasal drainage.  If you do develop sinus infection symptoms as we discussed please let me know and I will give you an antibiotic.  Regarding her blood pressure which is high today.  I did advise her best to get back on her losartan .Cautioned regarding short-term and long-term problems with high blood pressure.  Patient expressed understanding.  Follow-up in 7-10 days or as needed.  Mackie Pai, PA-C

## 2017-11-25 NOTE — Patient Instructions (Addendum)
You do have moderate to severe intermittent nasal congestion over the past month.  By your history and exam I am considering you have combination of allergic rhinitis and possibly some component of vasomotor rhinitis.  I do think it is a good idea to go ahead and start Flonase and Astelin nasal spray.  If your nasal congestion persist despite both medications then would consider Depo-Medrol 40 mg IM injection.  This is a steroid.  You have concerned about cost of this injection and you might touch base with your insurance on how they cover injections within office visits.  Also you had one month of the nasal congestion but you do not have any sinus pressure or colored nasal drainage.  If you do develop sinus infection symptoms as we discussed please let me know and I will give you an antibiotic.  Regarding her blood pressure which is high today.  I did advise her best to get back on her losartan.  Cautioned regarding short-term and long-term problems with high blood pressure.  Patient expressed understanding.  Follow-up in 7-10 days or as needed.

## 2017-11-26 NOTE — Telephone Encounter (Signed)
Patient was seen yesterday.

## 2017-11-27 ENCOUNTER — Ambulatory Visit (INDEPENDENT_AMBULATORY_CARE_PROVIDER_SITE_OTHER): Payer: BLUE CROSS/BLUE SHIELD | Admitting: Psychology

## 2017-11-27 DIAGNOSIS — F411 Generalized anxiety disorder: Secondary | ICD-10-CM | POA: Diagnosis not present

## 2017-11-27 DIAGNOSIS — F330 Major depressive disorder, recurrent, mild: Secondary | ICD-10-CM | POA: Diagnosis not present

## 2017-12-02 ENCOUNTER — Telehealth: Payer: Self-pay | Admitting: Family Medicine

## 2017-12-02 NOTE — Telephone Encounter (Signed)
The flonase can only be used 1 x a day  We can add singulair 10 mg #30  1 po qhs, 11 refills

## 2017-12-02 NOTE — Telephone Encounter (Signed)
Copied from [place] [id number]. Topic: Inquiry >> Dec 02, 2017  1:21 PM Hewitt Shorts wrote: Reason for CRM:pt is needing to know if she can increase the flonase, because once she uses the asterlin twice and It does not last that long and she feels like she needs to use something again  Best number  [phone]

## 2017-12-03 MED ORDER — MONTELUKAST SODIUM 10 MG PO TABS: 10.0000 mg | ORAL_TABLET | Freq: Every day | ORAL | 11 refills | Status: DC

## 2017-12-03 NOTE — Telephone Encounter (Signed)
Patient notified and rx sent in 

## 2017-12-29 ENCOUNTER — Ambulatory Visit (INDEPENDENT_AMBULATORY_CARE_PROVIDER_SITE_OTHER): Payer: BLUE CROSS/BLUE SHIELD | Admitting: Psychology

## 2017-12-29 DIAGNOSIS — F411 Generalized anxiety disorder: Secondary | ICD-10-CM | POA: Diagnosis not present

## 2017-12-29 DIAGNOSIS — F430 Acute stress reaction: Secondary | ICD-10-CM | POA: Diagnosis not present

## 2018-01-23 ENCOUNTER — Telehealth: Payer: Self-pay | Admitting: Family Medicine

## 2018-01-23 NOTE — Telephone Encounter (Signed)
Copied from [place] [id number]. Topic: Quick Communication - See Telephone Encounter >> Jan 23, 2018  8:11 AM Genella Rife H wrote: CRM for notification. See Telephone encounter for: 01/23/18.  Left voicemail appt needs to be rescheduled due to office being closed on 02/13/18

## 2018-01-26 ENCOUNTER — Ambulatory Visit (INDEPENDENT_AMBULATORY_CARE_PROVIDER_SITE_OTHER): Payer: BLUE CROSS/BLUE SHIELD | Admitting: Psychology

## 2018-01-26 DIAGNOSIS — F411 Generalized anxiety disorder: Secondary | ICD-10-CM

## 2018-01-26 DIAGNOSIS — F331 Major depressive disorder, recurrent, moderate: Secondary | ICD-10-CM

## 2018-01-26 DIAGNOSIS — F908 Attention-deficit hyperactivity disorder, other type: Secondary | ICD-10-CM

## 2018-01-30 ENCOUNTER — Ambulatory Visit: Payer: BLUE CROSS/BLUE SHIELD | Admitting: Family Medicine

## 2018-01-30 ENCOUNTER — Encounter: Payer: Self-pay | Admitting: Family Medicine

## 2018-01-30 VITALS — BP 138/86 | HR 84 | Temp 97.8°F | Resp 16 | Ht 64.0 in | Wt 182.4 lb

## 2018-01-30 DIAGNOSIS — E785 Hyperlipidemia, unspecified: Secondary | ICD-10-CM | POA: Diagnosis not present

## 2018-01-30 DIAGNOSIS — J324 Chronic pansinusitis: Secondary | ICD-10-CM | POA: Diagnosis not present

## 2018-01-30 DIAGNOSIS — R41840 Attention and concentration deficit: Secondary | ICD-10-CM

## 2018-01-30 DIAGNOSIS — F329 Major depressive disorder, single episode, unspecified: Secondary | ICD-10-CM | POA: Insufficient documentation

## 2018-01-30 DIAGNOSIS — E039 Hypothyroidism, unspecified: Secondary | ICD-10-CM

## 2018-01-30 DIAGNOSIS — I10 Essential (primary) hypertension: Secondary | ICD-10-CM

## 2018-01-30 DIAGNOSIS — F321 Major depressive disorder, single episode, moderate: Secondary | ICD-10-CM

## 2018-01-30 DIAGNOSIS — G4733 Obstructive sleep apnea (adult) (pediatric): Secondary | ICD-10-CM | POA: Diagnosis not present

## 2018-01-30 DIAGNOSIS — G4730 Sleep apnea, unspecified: Secondary | ICD-10-CM | POA: Insufficient documentation

## 2018-01-30 HISTORY — DX: Chronic pansinusitis: J32.4

## 2018-01-30 HISTORY — DX: Obstructive sleep apnea (adult) (pediatric): G47.33

## 2018-01-30 HISTORY — DX: Major depressive disorder, single episode, unspecified: F32.9

## 2018-01-30 LAB — CBC WITH DIFFERENTIAL/PLATELET
BASOS PCT: 0.3 % (ref 0.0–3.0)
Basophils Absolute: 0 10*3/uL (ref 0.0–0.1)
EOS PCT: 0.8 % (ref 0.0–5.0)
Eosinophils Absolute: 0 10*3/uL (ref 0.0–0.7)
HCT: 37.4 % (ref 36.0–46.0)
Hemoglobin: 12.6 g/dL (ref 12.0–15.0)
LYMPHS ABS: 2 10*3/uL (ref 0.7–4.0)
Lymphocytes Relative: 37.4 % (ref 12.0–46.0)
MCHC: 33.7 g/dL (ref 30.0–36.0)
MCV: 85.7 fl (ref 78.0–100.0)
MONOS PCT: 8.5 % (ref 3.0–12.0)
Monocytes Absolute: 0.4 10*3/uL (ref 0.1–1.0)
NEUTROS ABS: 2.8 10*3/uL (ref 1.4–7.7)
NEUTROS PCT: 53 % (ref 43.0–77.0)
Platelets: 282 10*3/uL (ref 150.0–400.0)
RBC: 4.36 Mil/uL (ref 3.87–5.11)
RDW: 13.6 % (ref 11.5–15.5)
WBC: 5.3 10*3/uL (ref 4.0–10.5)

## 2018-01-30 LAB — COMPREHENSIVE METABOLIC PANEL
ALT: 15 U/L (ref 0–35)
AST: 21 U/L (ref 0–37)
Albumin: 4.1 g/dL (ref 3.5–5.2)
Alkaline Phosphatase: 88 U/L (ref 39–117)
BUN: 12 mg/dL (ref 6–23)
CHLORIDE: 106 meq/L (ref 96–112)
CO2: 31 meq/L (ref 19–32)
Calcium: 9.6 mg/dL (ref 8.4–10.5)
Creatinine, Ser: 0.7 mg/dL (ref 0.40–1.20)
GFR: 108.86 mL/min (ref >60.00)
GLUCOSE: 101 mg/dL — AB (ref 70–99)
POTASSIUM: 5.3 meq/L — AB (ref 3.5–5.1)
SODIUM: 141 meq/L (ref 135–145)
Total Bilirubin: 0.5 mg/dL (ref 0.2–1.2)
Total Protein: 7.7 g/dL (ref 6.0–8.3)

## 2018-01-30 LAB — LIPID PANEL
CHOL/HDL RATIO: 3
Cholesterol: 158 mg/dL (ref 0–200)
HDL: 56.1 mg/dL (ref >39.00)
LDL CALC: 86 mg/dL (ref 0–99)
NONHDL: 102.32
Triglycerides: 83 mg/dL (ref 0.0–149.0)
VLDL: 16.6 mg/dL (ref 0.0–40.0)

## 2018-01-30 LAB — TSH: TSH: 0.81 u[IU]/mL (ref 0.35–4.50)

## 2018-01-30 MED ORDER — PREDNISONE 10 MG PO TABS: ORAL_TABLET | ORAL | 0 refills | Status: DC

## 2018-01-30 MED ORDER — METHYLPREDNISOLONE ACETATE 80 MG/ML IJ SUSP
80.0000 mg | Freq: Once | INTRAMUSCULAR | Status: AC
  Administered 2018-01-30: 80 mg via INTRAMUSCULAR

## 2018-01-30 MED ORDER — DOXYCYCLINE HYCLATE 100 MG PO TABS: 100.0000 mg | ORAL_TABLET | Freq: Two times a day (BID) | ORAL | 0 refills | Status: DC

## 2018-01-30 NOTE — Assessment & Plan Note (Signed)
Well controlled, no changes to meds. Encouraged heart healthy diet such as the DASH diet and exercise as tolerated.  °

## 2018-01-30 NOTE — Assessment & Plan Note (Signed)
Encouraged heart healthy diet, increase exercise, avoid trans fats, consider a krill oil cap daily 

## 2018-01-30 NOTE — Assessment & Plan Note (Signed)
con't meds F/u counselor

## 2018-01-30 NOTE — Patient Instructions (Signed)

## 2018-01-30 NOTE — Assessment & Plan Note (Signed)
F/u with pulm May be cause of some of her memory/ concentration problems

## 2018-01-30 NOTE — Assessment & Plan Note (Signed)
abx per orders depomedrol IM pred taper con't nasal sprays and singulair

## 2018-01-30 NOTE — Progress Notes (Signed)
Patient ID: Debra [name], female    DOB: 11-Nov-1955  Age: 62 y.o. MRN: [id number]    Subjective:  Subjective  HPI Debra [name] presents for f/u allergies-- she states it has gotten worse.  + facial pain and chills.  She is taking the nasal sprays, singulair and coricidan.   + nasal congestion.  +coughing -- non productive.   She is also c/o depression and add-- she saw [facility] and was told whe was borderline add--- they are wanting to do more testing.  She is not suicidal and is seeing counselor as well  She is under a lot of stress at school and is struggling to concentrate and sleep.  Admits to not using her cpap because she has been unable to clean it and has not f/u with pulm   Review of Systems  Constitutional: Positive for chills. Negative for fever.  HENT: Positive for congestion, postnasal drip, rhinorrhea, sinus pressure and sinus pain. Negative for sore throat.   Respiratory: Positive for cough. Negative for chest tightness, shortness of breath and wheezing.   Cardiovascular: Negative for chest pain, palpitations and leg swelling.  Gastrointestinal: Negative for nausea.  Allergic/Immunologic: Negative for environmental allergies.    History Past Medical History:  Diagnosis Date  . Anxiety   . DDD (degenerative disc disease)   . GERD (gastroesophageal reflux disease)   . 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 Thyroid disease    Hypothyroidism    She has a past surgical history that includes Carpal tunnel release; Leg Surgery; Cervical spine surgery; and Wrist fracture surgery (10-02-15).   Her family history includes Arthritis in her mother; Breast cancer in her mother; Cancer in her mother; Colon cancer in her maternal grandfather; Diabetes in her sister; Heart disease in her father; Heart disease (age of onset: 73) in her mother; Hyperlipidemia in her sister; Hypertension in her father and sister; Stroke in her father.She reports that she has  been smoking cigarettes.  She has a 33.00 pack-year smoking history. She has never used smokeless tobacco. She reports that she drinks alcohol. She reports that she does not use drugs.  Current Outpatient Medications on File Prior to Visit  Medication Sig Dispense Refill  . atomoxetine (STRATTERA) 100 MG capsule Take 1 capsule (100 mg total) by mouth daily. 90 capsule 1  . azelastine (ASTELIN) 0.1 % nasal spray Place 2 sprays into both nostrils 2 (two) times daily. Use in each nostril as directed 30 mL 2  . cyclobenzaprine (FLEXERIL) 10 MG tablet Take 1 tablet (10 mg total) by mouth at bedtime as needed for muscle spasms. 21 tablet 0  . ferrous sulfate (SLOW FE) 160 (50 Fe) MG TBCR SR tablet Take 1 tablet (160 mg total) by mouth daily. 90 each 3  . gabapentin (NEURONTIN) 600 MG tablet Take 1 tablet (600 mg total) by mouth 3 (three) times daily. 270 tablet 3  . levothyroxine (SYNTHROID, LEVOTHROID) 50 MCG tablet Take 1 tablet (50 mcg total) by mouth daily. 90 tablet 3  . losartan (COZAAR) 100 MG tablet Take 1 tablet (100 mg total) by mouth daily. 90 tablet 3  . metoprolol succinate (TOPROL-XL) 50 MG 24 hr tablet TAKE ONE TABLET BY MOUTH TWICE DAILY 180 tablet 1  . montelukast (SINGULAIR) 10 MG tablet Take 1 tablet (10 mg total) by mouth at bedtime. 30 tablet 11  . NON FORMULARY [facility]  Scar Cream -  Verapamil 10%, Pentoxifylline 5% Apply 1-2  grams to affected area 3-4 times daily Qty. 120 gm 3 refills    . Omega-3 Fatty Acids (FISH OIL PO) Take 1 tablet by mouth as needed. Reported on 03/12/2016    . PARoxetine (PAXIL) 30 MG tablet Take 1 tablet (30 mg total) by mouth daily. 90 tablet 1  . pravastatin (PRAVACHOL) 20 MG tablet Take 1 tablet (20 mg total) by mouth every evening. 90 tablet 1  . varenicline (CHANTIX CONTINUING MONTH PAK) 1 MG tablet Take 1 tablet (1 mg total) by mouth 2 (two) times daily. 60 tablet 2  . fluticasone (FLONASE) 50 MCG/ACT nasal spray Place 2 sprays into both  nostrils daily. (Patient not taking: Reported on 01/30/2018) 16 g 1   No current facility-administered medications on file prior to visit.      Objective:  Objective  Physical Exam  Constitutional: She is oriented to person, place, and time. She appears well-developed and well-nourished.  HENT:  Head: Normocephalic and atraumatic.  Eyes: Conjunctivae and EOM are normal.  Neck: Normal range of motion. Neck supple. No JVD present. Carotid bruit is not present. No thyromegaly present.  Cardiovascular: Normal rate, regular rhythm and normal heart sounds.  No murmur heard. Pulmonary/Chest: Effort normal and breath sounds normal. No respiratory distress. She has no wheezes. She has no rales. She exhibits no tenderness.  Musculoskeletal: She exhibits no edema.  Neurological: She is alert and oriented to person, place, and time.  Psychiatric: Judgment and thought content normal. She is not hyperactive. Cognition and memory are normal. She exhibits a depressed mood. She is inattentive.  Nursing note and vitals reviewed.  BP 138/86   Pulse 84   Temp 97.8 F (36.6 C) (Oral)   Resp 16   Ht 5\' 4"  (1.626 m)   Wt 182 lb 6.4 oz (82.7 kg)   SpO2 98%   BMI 31.31 kg/m  Wt Readings from Last 3 Encounters:  01/30/18 182 lb 6.4 oz (82.7 kg)  11/25/17 177 lb 12.8 oz (80.6 kg)  10/09/17 161 lb 6.4 oz (73.2 kg)     Lab Results  Component Value Date   WBC 5.3 01/30/2018   HGB 12.6 01/30/2018   HCT 37.4 01/30/2018   PLT 282.0 01/30/2018   GLUCOSE 101 (H) 01/30/2018   CHOL 158 01/30/2018   TRIG 83.0 01/30/2018   HDL 56.10 01/30/2018   LDLDIRECT 126.3 06/06/2011   LDLCALC 86 01/30/2018   ALT 15 01/30/2018   AST 21 01/30/2018   NA 141 01/30/2018   K 5.3 (H) 01/30/2018   CL 106 01/30/2018   CREATININE 0.70 01/30/2018   BUN 12 01/30/2018   CO2 31 01/30/2018   TSH 0.81 01/30/2018   HGBA1C 6.3 03/12/2016   MICROALBUR 3.1 (H) 11/07/2014    Mm Digital Screening Bilateral  Result Date:  11/14/2017 CLINICAL DATA:  Screening. EXAM: DIGITAL SCREENING BILATERAL MAMMOGRAM WITH CAD COMPARISON:  Previous exam(s). ACR Breast Density Category a: The breast tissue is almost entirely fatty. FINDINGS: There are no findings suspicious for malignancy. Images were processed with CAD. IMPRESSION: No mammographic evidence of malignancy. A result letter of this screening mammogram will be mailed directly to the patient. RECOMMENDATION: Screening mammogram in one year. (Code:SM-B-01Y) BI-RADS CATEGORY  1: Negative. Electronically Signed   By: Curlene Dolphin M.D.   On: 11/14/2017 16:12     Assessment & Plan:  Plan  I have discontinued Nyna R. Pavlovich's omeprazole, ranitidine, HYDROcodone-acetaminophen, and predniSONE. I am also having her start on predniSONE and doxycycline. Additionally,  I am having her maintain her Omega-3 Fatty Acids (FISH OIL PO), NON FORMULARY, pravastatin, PARoxetine, metoprolol succinate, losartan, gabapentin, ferrous sulfate, levothyroxine, atomoxetine, varenicline, cyclobenzaprine, fluticasone, azelastine, and montelukast. We administered methylPREDNISolone acetate.  Meds ordered this encounter  Medications  . predniSONE (DELTASONE) 10 MG tablet    Sig: TAKE 3 TABLETS PO QD FOR 3 DAYS THEN TAKE 2 TABLETS PO QD FOR 3 DAYS THEN TAKE 1 TABLET PO QD FOR 3 DAYS THEN TAKE 1/2 TAB PO QD FOR 3 DAYS    Dispense:  20 tablet    Refill:  0  . doxycycline (VIBRA-TABS) 100 MG tablet    Sig: Take 1 tablet (100 mg total) by mouth 2 (two) times daily.    Dispense:  20 tablet    Refill:  0  . methylPREDNISolone acetate (DEPO-MEDROL) injection 80 mg    Problem List Items Addressed This Visit      Unprioritized   Attention and concentration deficit    F/u [facility]      Depression, major, single episode, moderate ([place])    con't meds F/u counselor       Essential hypertension    Well controlled, no changes to meds. Encouraged heart healthy diet such as the DASH diet and exercise  as tolerated.       Relevant Orders   Lipid panel (Completed)   Comprehensive metabolic panel (Completed)   TSH (Completed)   CBC with Differential/Platelet (Completed)   CBC with Differential/Platelet   Hyperlipidemia    Encouraged heart healthy diet, increase exercise, avoid trans fats, consider a krill oil cap daily      Hypothyroidism - Primary   Relevant Orders   TSH (Completed)   CBC with Differential/Platelet   Pansinusitis    abx per orders depomedrol IM pred taper con't nasal sprays and singulair       Relevant Medications   predniSONE (DELTASONE) 10 MG tablet   doxycycline (VIBRA-TABS) 100 MG tablet   methylPREDNISolone acetate (DEPO-MEDROL) injection 80 mg (Completed)   Other Relevant Orders   CBC with Differential/Platelet   Sleep apnea    F/u with pulm May be cause of some of her memory/ concentration problems        Other Visit Diagnoses    OSA (obstructive sleep apnea)       Relevant Medications   methylPREDNISolone acetate (DEPO-MEDROL) injection 80 mg (Completed)   Other Relevant Orders   Ambulatory referral to Pulmonology      Follow-up: Return in about 6 months (around 08/01/2018), or if symptoms worsen or fail to improve, for fasting, annual exam.  Ann Held, DO

## 2018-01-30 NOTE — Assessment & Plan Note (Signed)
F/u [facility]

## 2018-02-10 ENCOUNTER — Ambulatory Visit: Payer: BLUE CROSS/BLUE SHIELD | Admitting: Family Medicine

## 2018-02-13 ENCOUNTER — Ambulatory Visit: Payer: BLUE CROSS/BLUE SHIELD | Admitting: Family Medicine

## 2018-02-18 ENCOUNTER — Ambulatory Visit (INDEPENDENT_AMBULATORY_CARE_PROVIDER_SITE_OTHER): Payer: BLUE CROSS/BLUE SHIELD | Admitting: Pulmonary Disease

## 2018-02-18 ENCOUNTER — Encounter: Payer: Self-pay | Admitting: Pulmonary Disease

## 2018-02-18 VITALS — BP 130/96 | HR 102 | Ht 63.0 in | Wt 183.4 lb

## 2018-02-18 DIAGNOSIS — G4733 Obstructive sleep apnea (adult) (pediatric): Secondary | ICD-10-CM

## 2018-02-18 NOTE — Addendum Note (Signed)
Addended by: Vivia Ewing on: 02/18/2018 05:19 PM   Modules accepted: Orders

## 2018-02-18 NOTE — Addendum Note (Signed)
Addended by: Vivia Ewing on: 02/18/2018 05:08 PM   Modules accepted: Orders

## 2018-02-18 NOTE — Patient Instructions (Signed)
Will get you set with your home care company again for new mask and supplies  Follow up in 6 months

## 2018-02-18 NOTE — Progress Notes (Signed)
[facility]  Chief Complaint  Patient presents with  . Follow-up    Reports she has not been sleeping well due to stress and a new medication.     Vital signs: BP (!) 130/96 (BP Location: Left Arm, Cuff Size: Normal)   Pulse (!) 102   Ht 5\' 3"  (1.6 m)   Wt 183 lb 6.4 oz (83.2 kg)   SpO2 97%   BMI 32.49 kg/m   History of Present Illness: Debra [name] is a 62 y.o. female with obstructive sleep apnea.  She needs new supplies.  She had an issue with her DME.  She feels CPAP helps.  She was confused about how to use SoClean.  Physical Exam:  General - pleasant Eyes - pupils reactive ENT - no sinus tenderness, no oral exudate, no LAN Cardiac - regular, no murmur Chest - no wheeze, rales Abd - soft, non tender Ext - no edema Skin - no rashes Neuro - normal strength Psych - normal mood   Assessment/Plan:  Obstructive sleep apnea. - will arrange for new CPAP mask and supplies - discussed how to use SoClean - continue auto CPAP   Patient Instructions  Will get you set with your home care company again for new mask and supplies  Follow up in 6 months    Chesley Mires, MD [place] 02/18/2018, 5:01 PM  Flow Sheet  Sleep tests: HST 03/15/15 >> AHI 5.2, SaO2 low 92%. Auto CPAP 07/10/15 to 08/08/15 >> used on 5 of 30 nights with average 2 hrs and 27 min.  Average AHI is 0.2 with median CPAP 5 cm H2O and 95 th percentile CPAP 7 cm H20.  Past Medical History: She  has a past medical history of Anxiety, DDD (degenerative disc disease), GERD (gastroesophageal reflux disease), Mixed connective tissue disease ([place]), and Thyroid disease.  Past Surgical History: She  has a past surgical history that includes Carpal tunnel release; Leg Surgery; Cervical spine surgery; and Wrist fracture surgery (10-02-15).  Family History: Her family history includes Arthritis in her mother; Breast cancer in her mother; Cancer in  her mother; Colon cancer in her maternal grandfather; Diabetes in her sister; Heart disease in her father; Heart disease (age of onset: 87) in her mother; Hyperlipidemia in her sister; Hypertension in her father and sister; Stroke in her father.  Social History: She  reports that she has been smoking cigarettes.  She has a 33.00 pack-year smoking history. She has never used smokeless tobacco. She reports that she drinks alcohol. She reports that she does not use drugs.  Medications: Allergies as of 02/18/2018      Reactions   Penicillins    Codeine Rash, Other (See Comments)   dizziness      Medication List        Accurate as of 02/18/18  5:01 PM. Always use your most recent med list.          atomoxetine 100 MG capsule Commonly known as:  STRATTERA Take 1 capsule (100 mg total) by mouth daily.   azelastine 0.1 % nasal spray Commonly known as:  ASTELIN Place 2 sprays into both nostrils 2 (two) times daily. Use in each nostril as directed   cyclobenzaprine 10 MG tablet Commonly known as:  FLEXERIL Take 1 tablet (10 mg total) by mouth at bedtime as needed for muscle spasms.   doxycycline 100 MG tablet Commonly known as:  VIBRA-TABS Take 1 tablet (100 mg total) by  mouth 2 (two) times daily.   ferrous sulfate 160 (50 Fe) MG Tbcr SR tablet Commonly known as:  SLOW FE Take 1 tablet (160 mg total) by mouth daily.   FISH OIL PO Take 1 tablet by mouth as needed. Reported on 03/12/2016   fluticasone 50 MCG/ACT nasal spray Commonly known as:  FLONASE Place 2 sprays into both nostrils daily.   gabapentin 600 MG tablet Commonly known as:  NEURONTIN Take 1 tablet (600 mg total) by mouth 3 (three) times daily.   levothyroxine 50 MCG tablet Commonly known as:  SYNTHROID, LEVOTHROID Take 1 tablet (50 mcg total) by mouth daily.   losartan 100 MG tablet Commonly known as:  COZAAR Take 1 tablet (100 mg total) by mouth daily.   metoprolol succinate 50 MG 24 hr tablet Commonly  known as:  TOPROL-XL TAKE ONE TABLET BY MOUTH TWICE DAILY   montelukast 10 MG tablet Commonly known as:  SINGULAIR Take 1 tablet (10 mg total) by mouth at bedtime.   NON FORMULARY [facility]  Scar Cream -  Verapamil 10%, Pentoxifylline 5% Apply 1-2 grams to affected area 3-4 times daily Qty. 120 gm 3 refills   PARoxetine 30 MG tablet Commonly known as:  PAXIL Take 1 tablet (30 mg total) by mouth daily.   pravastatin 20 MG tablet Commonly known as:  PRAVACHOL Take 1 tablet (20 mg total) by mouth every evening.   predniSONE 10 MG tablet Commonly known as:  DELTASONE TAKE 3 TABLETS PO QD FOR 3 DAYS THEN TAKE 2 TABLETS PO QD FOR 3 DAYS THEN TAKE 1 TABLET PO QD FOR 3 DAYS THEN TAKE 1/2 TAB PO QD FOR 3 DAYS   varenicline 1 MG tablet Commonly known as:  CHANTIX CONTINUING MONTH PAK Take 1 tablet (1 mg total) by mouth 2 (two) times daily.

## 2018-02-19 ENCOUNTER — Ambulatory Visit: Payer: BLUE CROSS/BLUE SHIELD | Admitting: Family Medicine

## 2018-02-19 DIAGNOSIS — Z0289 Encounter for other administrative examinations: Secondary | ICD-10-CM

## 2018-02-20 ENCOUNTER — Ambulatory Visit: Payer: BLUE CROSS/BLUE SHIELD | Admitting: Family Medicine

## 2018-03-02 DIAGNOSIS — G4733 Obstructive sleep apnea (adult) (pediatric): Secondary | ICD-10-CM | POA: Diagnosis not present

## 2018-03-03 DIAGNOSIS — G4733 Obstructive sleep apnea (adult) (pediatric): Secondary | ICD-10-CM | POA: Diagnosis not present

## 2018-03-17 ENCOUNTER — Ambulatory Visit (INDEPENDENT_AMBULATORY_CARE_PROVIDER_SITE_OTHER): Payer: BLUE CROSS/BLUE SHIELD | Admitting: Psychology

## 2018-03-17 DIAGNOSIS — F908 Attention-deficit hyperactivity disorder, other type: Secondary | ICD-10-CM

## 2018-03-17 DIAGNOSIS — F411 Generalized anxiety disorder: Secondary | ICD-10-CM | POA: Diagnosis not present

## 2018-03-17 DIAGNOSIS — F331 Major depressive disorder, recurrent, moderate: Secondary | ICD-10-CM | POA: Diagnosis not present

## 2018-03-31 ENCOUNTER — Ambulatory Visit: Payer: BLUE CROSS/BLUE SHIELD | Admitting: Psychology

## 2018-04-21 ENCOUNTER — Encounter: Payer: Self-pay | Admitting: Family Medicine

## 2018-04-21 ENCOUNTER — Ambulatory Visit: Payer: BLUE CROSS/BLUE SHIELD | Admitting: Family Medicine

## 2018-04-21 ENCOUNTER — Other Ambulatory Visit (HOSPITAL_COMMUNITY)
Admission: RE | Admit: 2018-04-21 | Discharge: 2018-04-21 | Disposition: A | Discharge status: Home / Self Care | Payer: BLUE CROSS/BLUE SHIELD | Source: Ambulatory Visit | Attending: Family Medicine | Admitting: Family Medicine

## 2018-04-21 VITALS — BP 143/79 | HR 86 | Temp 98.1°F | Resp 16 | Wt 183.0 lb

## 2018-04-21 DIAGNOSIS — N760 Acute vaginitis: Secondary | ICD-10-CM | POA: Diagnosis not present

## 2018-04-21 DIAGNOSIS — F411 Generalized anxiety disorder: Secondary | ICD-10-CM | POA: Diagnosis not present

## 2018-04-21 DIAGNOSIS — Z720 Tobacco use: Secondary | ICD-10-CM | POA: Diagnosis not present

## 2018-04-21 DIAGNOSIS — I10 Essential (primary) hypertension: Secondary | ICD-10-CM

## 2018-04-21 DIAGNOSIS — D649 Anemia, unspecified: Secondary | ICD-10-CM

## 2018-04-21 MED ORDER — AZELASTINE HCL 0.1 % NA SOLN: 2.0000 | Freq: Two times a day (BID) | NASAL | 2 refills | Status: DC

## 2018-04-21 MED ORDER — METOPROLOL SUCCINATE ER 100 MG PO TB24
100.0000 mg | ORAL_TABLET | Freq: Every day | ORAL | 3 refills | Status: DC
Start: 2018-04-21 — End: 2018-05-14

## 2018-04-21 MED ORDER — HYDROCHLOROTHIAZIDE 25 MG PO TABS: ORAL_TABLET | ORAL | 3 refills | Status: DC

## 2018-04-21 MED ORDER — FERROUS SULFATE DRIED ER 160 (50 FE) MG PO TBCR: 1.0000 | EXTENDED_RELEASE_TABLET | Freq: Every day | ORAL | 3 refills | Status: DC

## 2018-04-21 MED ORDER — METOPROLOL SUCCINATE ER 50 MG PO TB24: ORAL_TABLET | ORAL | 1 refills | Status: DC

## 2018-04-21 MED ORDER — LOSARTAN POTASSIUM 100 MG PO TABS: 100.0000 mg | ORAL_TABLET | Freq: Every day | ORAL | 3 refills | Status: DC

## 2018-04-21 MED ORDER — PAROXETINE HCL 30 MG PO TABS: 30.0000 mg | ORAL_TABLET | Freq: Every day | ORAL | 1 refills | Status: DC

## 2018-04-21 MED ORDER — VARENICLINE TARTRATE 0.5 MG X 11 & 1 MG X 42 PO MISC: ORAL | 0 refills | Status: DC

## 2018-04-21 NOTE — Patient Instructions (Signed)
DASH Eating Plan DASH stands for "Dietary Approaches to Stop Hypertension." The DASH eating plan is a healthy eating plan that has been shown to reduce high blood pressure (hypertension). It may also reduce your risk for type 2 diabetes, heart disease, and stroke. The DASH eating plan may also help with weight loss. What are tips for following this plan? General guidelines  Avoid eating more than 2,300 mg (milligrams) of salt (sodium) a day. If you have hypertension, you may need to reduce your sodium intake to 1,500 mg a day.  Limit alcohol intake to no more than 1 drink a day for nonpregnant women and 2 drinks a day for men. One drink equals 12 oz of beer, 5 oz of wine, or 1 oz of hard liquor.  Work with your health care provider to maintain a healthy body weight or to lose weight. Ask what an ideal weight is for you.  Get at least 30 minutes of exercise that causes your heart to beat faster (aerobic exercise) most days of the week. Activities may include walking, swimming, or biking.  Work with your health care provider or diet and nutrition specialist (dietitian) to adjust your eating plan to your individual calorie needs. Reading food labels  Check food labels for the amount of sodium per serving. Choose foods with less than 5 percent of the Daily Value of sodium. Generally, foods with less than 300 mg of sodium per serving fit into this eating plan.  To find whole grains, look for the word "whole" as the first word in the ingredient list. Shopping  Buy products labeled as "low-sodium" or "no salt added."  Buy fresh foods. Avoid canned foods and premade or frozen meals. Cooking  Avoid adding salt when cooking. Use salt-free seasonings or herbs instead of table salt or sea salt. Check with your health care provider or pharmacist before using salt substitutes.  Do not fry foods. Cook foods using healthy methods such as baking, boiling, grilling, and broiling instead.  Cook with  heart-healthy oils, such as olive, canola, soybean, or sunflower oil. Meal planning   Eat a balanced diet that includes: ? 5 or more servings of fruits and vegetables each day. At each meal, try to fill half of your plate with fruits and vegetables. ? Up to 6-8 servings of whole grains each day. ? Less than 6 oz of lean meat, poultry, or fish each day. A 3-oz serving of meat is about the same size as a deck of cards. One egg equals 1 oz. ? 2 servings of low-fat dairy each day. ? A serving of nuts, seeds, or beans 5 times each week. ? Heart-healthy fats. Healthy fats called Omega-3 fatty acids are found in foods such as flaxseeds and coldwater fish, like sardines, salmon, and mackerel.  Limit how much you eat of the following: ? Canned or prepackaged foods. ? Food that is high in trans fat, such as fried foods. ? Food that is high in saturated fat, such as fatty meat. ? Sweets, desserts, sugary drinks, and other foods with added sugar. ? Full-fat dairy products.  Do not salt foods before eating.  Try to eat at least 2 vegetarian meals each week.  Eat more home-cooked food and less restaurant, buffet, and fast food.  When eating at a restaurant, ask that your food be prepared with less salt or no salt, if possible. What foods are recommended? The items listed may not be a complete list. Talk with your dietitian about what   dietary choices are best for you. Grains Whole-grain or whole-wheat bread. Whole-grain or whole-wheat pasta. Brown rice. Oatmeal. Quinoa. Bulgur. Whole-grain and low-sodium cereals. Pita bread. Low-fat, low-sodium crackers. Whole-wheat flour tortillas. Vegetables Fresh or frozen vegetables (raw, steamed, roasted, or grilled). Low-sodium or reduced-sodium tomato and vegetable juice. Low-sodium or reduced-sodium tomato sauce and tomato paste. Low-sodium or reduced-sodium canned vegetables. Fruits All fresh, dried, or frozen fruit. Canned fruit in natural juice (without  added sugar). Meat and other protein foods Skinless chicken or turkey. Ground chicken or turkey. Pork with fat trimmed off. Fish and seafood. Egg whites. Dried beans, peas, or lentils. Unsalted nuts, nut butters, and seeds. Unsalted canned beans. Lean cuts of beef with fat trimmed off. Low-sodium, lean deli meat. Dairy Low-fat (1%) or fat-free (skim) milk. Fat-free, low-fat, or reduced-fat cheeses. Nonfat, low-sodium ricotta or cottage cheese. Low-fat or nonfat yogurt. Low-fat, low-sodium cheese. Fats and oils Soft margarine without trans fats. Vegetable oil. Low-fat, reduced-fat, or light mayonnaise and salad dressings (reduced-sodium). Canola, safflower, olive, soybean, and sunflower oils. Avocado. Seasoning and other foods Herbs. Spices. Seasoning mixes without salt. Unsalted popcorn and pretzels. Fat-free sweets. What foods are not recommended? The items listed may not be a complete list. Talk with your dietitian about what dietary choices are best for you. Grains Baked goods made with fat, such as croissants, muffins, or some breads. Dry pasta or rice meal packs. Vegetables Creamed or fried vegetables. Vegetables in a cheese sauce. Regular canned vegetables (not low-sodium or reduced-sodium). Regular canned tomato sauce and paste (not low-sodium or reduced-sodium). Regular tomato and vegetable juice (not low-sodium or reduced-sodium). Pickles. Olives. Fruits Canned fruit in a light or heavy syrup. Fried fruit. Fruit in cream or butter sauce. Meat and other protein foods Fatty cuts of meat. Ribs. Fried meat. Bacon. Sausage. Bologna and other processed lunch meats. Salami. Fatback. Hotdogs. Bratwurst. Salted nuts and seeds. Canned beans with added salt. Canned or smoked fish. Whole eggs or egg yolks. Chicken or turkey with skin. Dairy Whole or 2% milk, cream, and half-and-half. Whole or full-fat cream cheese. Whole-fat or sweetened yogurt. Full-fat cheese. Nondairy creamers. Whipped toppings.  Processed cheese and cheese spreads. Fats and oils Butter. Stick margarine. Lard. Shortening. Ghee. Bacon fat. Tropical oils, such as coconut, palm kernel, or palm oil. Seasoning and other foods Salted popcorn and pretzels. Onion salt, garlic salt, seasoned salt, table salt, and sea salt. Worcestershire sauce. Tartar sauce. Barbecue sauce. Teriyaki sauce. Soy sauce, including reduced-sodium. Steak sauce. Canned and packaged gravies. Fish sauce. Oyster sauce. Cocktail sauce. Horseradish that you find on the shelf. Ketchup. Mustard. Meat flavorings and tenderizers. Bouillon cubes. Hot sauce and Tabasco sauce. Premade or packaged marinades. Premade or packaged taco seasonings. Relishes. Regular salad dressings. Where to find more information:  National Heart, Lung, and Blood Institute: www.nhlbi.nih.gov  American Heart Association: www.heart.org Summary  The DASH eating plan is a healthy eating plan that has been shown to reduce high blood pressure (hypertension). It may also reduce your risk for type 2 diabetes, heart disease, and stroke.  With the DASH eating plan, you should limit salt (sodium) intake to 2,300 mg a day. If you have hypertension, you may need to reduce your sodium intake to 1,500 mg a day.  When on the DASH eating plan, aim to eat more fresh fruits and vegetables, whole grains, lean proteins, low-fat dairy, and heart-healthy fats.  Work with your health care provider or diet and nutrition specialist (dietitian) to adjust your eating plan to your individual   calorie needs. This information is not intended to replace advice given to you by your health care provider. Make sure you discuss any questions you have with your health care provider. Document Released: 10/03/2011 Document Revised: 10/07/2016 Document Reviewed: 10/07/2016 [employer] Interactive Patient Education  2018 [employer]  

## 2018-04-21 NOTE — Progress Notes (Signed)
Subjective:  I acted as a Education administrator for [employer]. Yancey Flemings, [place]   Patient ID: Debra [name], female    DOB: 08/12/1956, 62 y.o.   MRN: [id number]  Chief Complaint  Patient presents with  . Rash    both legs    HPI  Patient is in today for rash on both legs.  It is almost completely resolved but states her skin looked mottled and legs were swollen --- it comes and goes.   Pt is also c/o vaginal d/c ,  No urinary frequency, dysuria.   She also needs refills of meds Patient Care Team: Carollee Herter, Alferd Apa, DO as PCP - General Leeroy Cha, MD as Consulting Physician (Neurosurgery) Chesley Mires, MD as Consulting Physician (Pulmonary Disease)   Past Medical History:  Diagnosis Date  . Anxiety   . DDD (degenerative disc disease)   . GERD (gastroesophageal reflux disease)   . 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 Thyroid disease    Hypothyroidism    Past Surgical History:  Procedure Laterality Date  . CARPAL TUNNEL RELEASE     Bilateral  . CERVICAL SPINE SURGERY     x3  . LEG SURGERY     Right   . WRIST FRACTURE SURGERY  10-02-15   Pt have surgery twice on the same wrist.    Family History  Problem Relation Age of Onset  . Breast cancer Mother        possibly 75's  . Arthritis Mother        rheumatoid  . Cancer Mother        breast  . Heart disease Mother 31       MI  . Heart disease Father        MI  . Hypertension Father   . Stroke Father   . Diabetes Sister   . Hypertension Sister   . Hyperlipidemia Sister   . Colon cancer Maternal Grandfather     Social History   Socioeconomic History  . Marital status: Legally Separated    Spouse name: Not on file  . Number of children: 3  . Years of education: Not on file  . Highest education level: Not on file  Occupational History  . Occupation: SECURITY GUARD    Employer: [employer]  Social Needs  . Financial resource strain: Not on file  . Food insecurity:   Worry: Not on file    Inability: Not on file  . Transportation needs:    Medical: Not on file    Non-medical: Not on file  Tobacco Use  . Smoking status: Current Every Day Smoker    Packs/day: 0.75    Years: 44.00    Pack years: 33.00    Types: Cigarettes  . Smokeless tobacco: Never Used  . Tobacco comment: Started Chantix 08/24/2017  Substance and Sexual Activity  . Alcohol use: Yes    Alcohol/week: 0.0 oz    Comment: 2-3 drinks per week (beer/wine)  . Drug use: No  . Sexual activity: Yes    Partners: Male    Birth control/protection: Post-menopausal  Lifestyle  . Physical activity:    Days per week: Not on file    Minutes per session: Not on file  . Stress: Not on file  Relationships  . Social connections:    Talks on phone: Not on file    Gets together: Not on file    Attends religious service: Not on file  Active member of club or organization: Not on file    Attends meetings of clubs or organizations: Not on file    Relationship status: Not on file  . Intimate partner violence:    Fear of current or ex partner: Not on file    Emotionally abused: Not on file    Physically abused: Not on file    Forced sexual activity: Not on file  Other Topics Concern  . Not on file  Social History Narrative  . Not on file    Outpatient Medications Prior to Visit  Medication Sig Dispense Refill  . atomoxetine (STRATTERA) 100 MG capsule Take 1 capsule (100 mg total) by mouth daily. 90 capsule 1  . cyclobenzaprine (FLEXERIL) 10 MG tablet Take 1 tablet (10 mg total) by mouth at bedtime as needed for muscle spasms. 21 tablet 0  . doxycycline (VIBRA-TABS) 100 MG tablet Take 1 tablet (100 mg total) by mouth 2 (two) times daily. 20 tablet 0  . gabapentin (NEURONTIN) 600 MG tablet Take 1 tablet (600 mg total) by mouth 3 (three) times daily. 270 tablet 3  . levothyroxine (SYNTHROID, LEVOTHROID) 50 MCG tablet Take 1 tablet (50 mcg total) by mouth daily. 90 tablet 3  . montelukast  (SINGULAIR) 10 MG tablet Take 1 tablet (10 mg total) by mouth at bedtime. 30 tablet 11  . NON FORMULARY [facility]  Scar Cream -  Verapamil 10%, Pentoxifylline 5% Apply 1-2 grams to affected area 3-4 times daily Qty. 120 gm 3 refills    . Omega-3 Fatty Acids (FISH OIL PO) Take 1 tablet by mouth as needed. Reported on 03/12/2016    . pravastatin (PRAVACHOL) 20 MG tablet Take 1 tablet (20 mg total) by mouth every evening. 90 tablet 1  . predniSONE (DELTASONE) 10 MG tablet TAKE 3 TABLETS PO QD FOR 3 DAYS THEN TAKE 2 TABLETS PO QD FOR 3 DAYS THEN TAKE 1 TABLET PO QD FOR 3 DAYS THEN TAKE 1/2 TAB PO QD FOR 3 DAYS 20 tablet 0  . varenicline (CHANTIX CONTINUING MONTH PAK) 1 MG tablet Take 1 tablet (1 mg total) by mouth 2 (two) times daily. 60 tablet 2  . azelastine (ASTELIN) 0.1 % nasal spray Place 2 sprays into both nostrils 2 (two) times daily. Use in each nostril as directed 30 mL 2  . ferrous sulfate (SLOW FE) 160 (50 Fe) MG TBCR SR tablet Take 1 tablet (160 mg total) by mouth daily. 90 each 3  . fluticasone (FLONASE) 50 MCG/ACT nasal spray Place 2 sprays into both nostrils daily. 16 g 1  . losartan (COZAAR) 100 MG tablet Take 1 tablet (100 mg total) by mouth daily. 90 tablet 3  . metoprolol succinate (TOPROL-XL) 50 MG 24 hr tablet TAKE ONE TABLET BY MOUTH TWICE DAILY 180 tablet 1  . PARoxetine (PAXIL) 30 MG tablet Take 1 tablet (30 mg total) by mouth daily. 90 tablet 1   No facility-administered medications prior to visit.     Allergies  Allergen Reactions  . Penicillins   . Codeine Rash and Other (See Comments)    dizziness    Review of Systems  Constitutional: Negative for chills, fever and malaise/fatigue.  HENT: Negative for congestion and hearing loss.   Eyes: Negative for discharge.  Respiratory: Negative for cough, sputum production and shortness of breath.   Cardiovascular: Negative for chest pain, palpitations and leg swelling.  Gastrointestinal: Negative for abdominal  pain, blood in stool, constipation, diarrhea, heartburn, nausea and vomiting.  Genitourinary:  Negative for dysuria, frequency, hematuria and urgency.  Musculoskeletal: Negative for back pain, falls and myalgias.  Skin: Negative for rash.  Neurological: Negative for dizziness, sensory change, loss of consciousness, weakness and headaches.  Endo/Heme/Allergies: Negative for environmental allergies. Does not bruise/bleed easily.  Psychiatric/Behavioral: Negative for depression and suicidal ideas. The patient is not nervous/anxious and does not have insomnia.        Objective:    Physical Exam  Constitutional: She is oriented to person, place, and time. She appears well-developed and well-nourished.  HENT:  Head: Normocephalic and atraumatic.  Eyes: Conjunctivae and EOM are normal.  Neck: Normal range of motion. Neck supple. No JVD present. Carotid bruit is not present. No thyromegaly present.  Cardiovascular: Normal rate, regular rhythm and normal heart sounds.  No murmur heard. Pulmonary/Chest: Effort normal and breath sounds normal. No respiratory distress. She has no wheezes. She has no rales. She exhibits no tenderness.  Musculoskeletal: She exhibits no edema.  Neurological: She is alert and oriented to person, place, and time.  Skin: No rash noted. There is erythema. No pallor.  Skin normal today She has pictures on her phone-- skin is red and mottled + edema   Psychiatric: She has a normal mood and affect.  Nursing note and vitals reviewed.   BP (!) 143/79 (BP Location: Left Arm, Patient Position: Sitting, Cuff Size: Normal)   Pulse 86   Temp 98.1 F (36.7 C) (Oral)   Resp 16   Wt 183 lb (83 kg)   SpO2 98%   BMI 32.42 kg/m  Wt Readings from Last 3 Encounters:  04/21/18 183 lb (83 kg)  02/18/18 183 lb 6.4 oz (83.2 kg)  01/30/18 182 lb 6.4 oz (82.7 kg)   BP Readings from Last 3 Encounters:  04/21/18 (!) 143/79  02/18/18 (!) 130/96  01/30/18 138/86     Immunization  History  Administered Date(s) Administered  . Influenza Whole 10/01/2007, 08/31/2010  . Influenza,inj,Quad PF,6+ Mos 10/12/2013, 08/29/2015, 08/13/2016, 08/12/2017  . Td 11/02/2010  . Zoster 03/12/2016  . Zoster Recombinat (Shingrix) 04/17/2017, 06/17/2017    Health Maintenance  Topic Date Due  . PAP SMEAR  02/20/2018  . INFLUENZA VACCINE  05/28/2018  . MAMMOGRAM  11/14/2018  . DEXA SCAN  04/09/2019  . COLONOSCOPY  07/28/2019  . TETANUS/TDAP  11/02/2020  . Hepatitis C Screening  Completed  . HIV Screening  Completed    Lab Results  Component Value Date   WBC 5.3 01/30/2018   HGB 12.6 01/30/2018   HCT 37.4 01/30/2018   PLT 282.0 01/30/2018   GLUCOSE 101 (H) 01/30/2018   CHOL 158 01/30/2018   TRIG 83.0 01/30/2018   HDL 56.10 01/30/2018   LDLDIRECT 126.3 06/06/2011   LDLCALC 86 01/30/2018   ALT 15 01/30/2018   AST 21 01/30/2018   NA 141 01/30/2018   K 5.3 (H) 01/30/2018   CL 106 01/30/2018   CREATININE 0.70 01/30/2018   BUN 12 01/30/2018   CO2 31 01/30/2018   TSH 0.81 01/30/2018   HGBA1C 6.3 03/12/2016   MICROALBUR 3.1 (H) 11/07/2014    Lab Results  Component Value Date   TSH 0.81 01/30/2018   Lab Results  Component Value Date   WBC 5.3 01/30/2018   HGB 12.6 01/30/2018   HCT 37.4 01/30/2018   MCV 85.7 01/30/2018   PLT 282.0 01/30/2018   Lab Results  Component Value Date   NA 141 01/30/2018   K 5.3 (H) 01/30/2018   CO2 31 01/30/2018  GLUCOSE 101 (H) 01/30/2018   BUN 12 01/30/2018   CREATININE 0.70 01/30/2018   BILITOT 0.5 01/30/2018   ALKPHOS 88 01/30/2018   AST 21 01/30/2018   ALT 15 01/30/2018   PROT 7.7 01/30/2018   ALBUMIN 4.1 01/30/2018   CALCIUM 9.6 01/30/2018   ANIONGAP 11 08/31/2014   GFR 108.86 01/30/2018   Lab Results  Component Value Date   CHOL 158 01/30/2018   Lab Results  Component Value Date   HDL 56.10 01/30/2018   Lab Results  Component Value Date   LDLCALC 86 01/30/2018   Lab Results  Component Value Date   TRIG  83.0 01/30/2018   Lab Results  Component Value Date   CHOLHDL 3 01/30/2018   Lab Results  Component Value Date   HGBA1C 6.3 03/12/2016         Assessment & Plan:   Problem List Items Addressed This Visit      Unprioritized   Acute vaginitis    Check urine       Relevant Orders   Urine cytology ancillary only   Essential hypertension - Primary    Poorly controlled will alter medications, encouraged DASH diet, minimize caffeine and obtain adequate sleep. Report concerning symptoms and follow up as directed and as needed Start hctz Inc metoprolol Edema may be cause off mottled look      Relevant Medications   losartan (COZAAR) 100 MG tablet   metoprolol succinate (TOPROL-XL) 100 MG 24 hr tablet   hydrochlorothiazide (HYDRODIURIL) 25 MG tablet   Generalized anxiety disorder    Stable co'nt meds      Relevant Medications   PARoxetine (PAXIL) 30 MG tablet   Morbid obesity (HCC)    Refer to healthy weight and wellness      Relevant Orders   Amb Ref to Medical Weight Management   Tobacco abuse    Restart chantix rto prn      Relevant Medications   varenicline (CHANTIX STARTING MONTH PAK) 0.5 MG X 11 & 1 MG X 42 tablet   UNSPECIFIED ANEMIA   Relevant Medications   ferrous sulfate (SLOW FE) 160 (50 Fe) MG TBCR SR tablet      I have discontinued Denene R. Okuda's metoprolol succinate, fluticasone, and metoprolol succinate. I am also having her start on metoprolol succinate, hydrochlorothiazide, and varenicline. Additionally, I am having her maintain her Omega-3 Fatty Acids (FISH OIL PO), NON FORMULARY, pravastatin, gabapentin, levothyroxine, atomoxetine, varenicline, cyclobenzaprine, montelukast, predniSONE, doxycycline, azelastine, ferrous sulfate, losartan, and PARoxetine.  Meds ordered this encounter  Medications  . azelastine (ASTELIN) 0.1 % nasal spray    Sig: Place 2 sprays into both nostrils 2 (two) times daily. Use in each nostril as directed     Dispense:  30 mL    Refill:  2  . ferrous sulfate (SLOW FE) 160 (50 Fe) MG TBCR SR tablet    Sig: Take 1 tablet (160 mg total) by mouth daily.    Dispense:  90 each    Refill:  3  . losartan (COZAAR) 100 MG tablet    Sig: Take 1 tablet (100 mg total) by mouth daily.    Dispense:  90 tablet    Refill:  3  . PARoxetine (PAXIL) 30 MG tablet    Sig: Take 1 tablet (30 mg total) by mouth daily.    Dispense:  90 tablet    Refill:  1    D/C PREVIOUS SCRIPTS FOR THIS MEDICATION  . DISCONTD: metoprolol succinate (TOPROL-XL)  50 MG 24 hr tablet    Sig: TAKE ONE TABLET BY MOUTH TWICE DAILY    Dispense:  180 tablet    Refill:  1  . metoprolol succinate (TOPROL-XL) 100 MG 24 hr tablet    Sig: Take 1 tablet (100 mg total) by mouth daily. Take with or immediately following a meal.    Dispense:  90 tablet    Refill:  3  . hydrochlorothiazide (HYDRODIURIL) 25 MG tablet    Sig: 1/2 po qd    Dispense:  45 tablet    Refill:  3  . varenicline (CHANTIX STARTING MONTH PAK) 0.5 MG X 11 & 1 MG X 42 tablet    Sig: Take one 0.5 mg tablet by mouth once daily for 3 days, then increase to one 0.5 mg tablet twice daily for 4 days, then increase to one 1 mg tablet twice daily.    Dispense:  53 tablet    Refill:  0    CMA served as scribe during this visit. History, Physical and Plan performed by medical provider. Documentation and orders reviewed and attested to.  Ann Held, DO

## 2018-04-22 DIAGNOSIS — F411 Generalized anxiety disorder: Secondary | ICD-10-CM | POA: Insufficient documentation

## 2018-04-22 DIAGNOSIS — N760 Acute vaginitis: Secondary | ICD-10-CM | POA: Insufficient documentation

## 2018-04-22 HISTORY — DX: Generalized anxiety disorder: F41.1

## 2018-04-22 NOTE — Assessment & Plan Note (Signed)
Check urine.

## 2018-04-22 NOTE — Assessment & Plan Note (Signed)
Restart chantix rto prn

## 2018-04-22 NOTE — Assessment & Plan Note (Signed)
Stable con't meds 

## 2018-04-22 NOTE — Assessment & Plan Note (Signed)
-   Refer to healthy weight and wellness 

## 2018-04-22 NOTE — Assessment & Plan Note (Signed)
Poorly controlled will alter medications, encouraged DASH diet, minimize caffeine and obtain adequate sleep. Report concerning symptoms and follow up as directed and as needed Start hctz Inc metoprolol Edema may be cause off mottled look

## 2018-04-23 LAB — URINE CYTOLOGY ANCILLARY ONLY
Bacterial vaginitis: NEGATIVE
Candida vaginitis: NEGATIVE
Chlamydia: NEGATIVE
NEISSERIA GONORRHEA: NEGATIVE
Trichomonas: NEGATIVE

## 2018-04-29 ENCOUNTER — Ambulatory Visit (INDEPENDENT_AMBULATORY_CARE_PROVIDER_SITE_OTHER): Payer: BLUE CROSS/BLUE SHIELD | Admitting: Psychology

## 2018-04-29 DIAGNOSIS — F819 Developmental disorder of scholastic skills, unspecified: Secondary | ICD-10-CM

## 2018-04-29 DIAGNOSIS — F810 Specific reading disorder: Secondary | ICD-10-CM

## 2018-04-29 DIAGNOSIS — F411 Generalized anxiety disorder: Secondary | ICD-10-CM | POA: Diagnosis not present

## 2018-04-29 DIAGNOSIS — F908 Attention-deficit hyperactivity disorder, other type: Secondary | ICD-10-CM | POA: Diagnosis not present

## 2018-04-29 DIAGNOSIS — F321 Major depressive disorder, single episode, moderate: Secondary | ICD-10-CM

## 2018-05-13 DIAGNOSIS — H40023 Open angle with borderline findings, high risk, bilateral: Secondary | ICD-10-CM | POA: Diagnosis not present

## 2018-05-13 DIAGNOSIS — H2513 Age-related nuclear cataract, bilateral: Secondary | ICD-10-CM | POA: Diagnosis not present

## 2018-05-13 DIAGNOSIS — E119 Type 2 diabetes mellitus without complications: Secondary | ICD-10-CM | POA: Diagnosis not present

## 2018-05-13 DIAGNOSIS — H25013 Cortical age-related cataract, bilateral: Secondary | ICD-10-CM | POA: Diagnosis not present

## 2018-05-13 DIAGNOSIS — H5310 Unspecified subjective visual disturbances: Secondary | ICD-10-CM | POA: Diagnosis not present

## 2018-05-13 LAB — HM DIABETES EYE EXAM

## 2018-05-14 ENCOUNTER — Ambulatory Visit (INDEPENDENT_AMBULATORY_CARE_PROVIDER_SITE_OTHER): Payer: BLUE CROSS/BLUE SHIELD | Admitting: Family Medicine

## 2018-05-14 VITALS — BP 111/82 | HR 72

## 2018-05-14 DIAGNOSIS — I10 Essential (primary) hypertension: Secondary | ICD-10-CM | POA: Diagnosis not present

## 2018-05-14 NOTE — Patient Instructions (Signed)
Pt advised per Dr. Etter Sjogren to continue medications as prescribed and follow up in 3 months.  Pt already has appt scheduled with Dr. Etter Sjogren for 08/03/2018.

## 2018-05-14 NOTE — Progress Notes (Addendum)
Pt here for Blood pressure check per Dr. Etter Sjogren  Pt currently takes:  losartan (COZAAR) 100 MG tab daily and Metoprolol succinate (TOPROL-XL) 100 MG tab    Pt reports compliance with medication.   BP today @ = 111/82 HR = 72  Pt advised per Dr. Etter Sjogren to continue medications as prescribed and follow up in 3 months.  Pt already has appt scheduled with Dr. Etter Sjogren for 08/03/2018.  Ann Held, DO

## 2018-05-18 ENCOUNTER — Other Ambulatory Visit: Payer: Self-pay | Admitting: Family Medicine

## 2018-05-18 DIAGNOSIS — R41840 Attention and concentration deficit: Secondary | ICD-10-CM

## 2018-05-20 ENCOUNTER — Encounter: Payer: Self-pay | Admitting: *Deleted

## 2018-05-21 ENCOUNTER — Encounter: Payer: Self-pay | Admitting: *Deleted

## 2018-06-25 ENCOUNTER — Ambulatory Visit: Payer: Self-pay | Admitting: Family Medicine

## 2018-06-25 NOTE — Telephone Encounter (Signed)
The patient called and states she has had diarrhea and vomiting since Tuesday ,she has been unable to eat and would like some suggestions and also says her chest is burning from indigestion from vomiting, She would like something called in for this she is unable to come in for an appointment at this time ,she is currently taking zantac , please advise [place]  Call to patient- she states her symptoms have improved today- she has not had episode today- she states the belching caused the vomiting. She has not been able to eat and is only drinking water. She has had symptoms since Tuesday. Discussed BRAT diet and hydration risk- patient does not have access to these foods and will have to rely on her brother to bring them after he gets off work. Offered appointment- but patient declines- she states she will call back. Told patient to please call her brother and let him know she needs his help after he gets off work- he can take her to UC/ED for fluids and help with her reflux symptoms. She states she will call back. Reason for Disposition . [1] SEVERE diarrhea (e.g., 7 or more times / day more than normal) AND [2] age > 60 years  Answer Assessment - Initial Assessment Questions 1. DIARRHEA SEVERITY: "How bad is the diarrhea?" "How many extra stools have you had in the past 24 hours than normal?"    - NO DIARRHEA (SCALE 0)   - MILD (SCALE 1-3): Few loose or mushy BMs; increase of 1-3 stools over normal daily number of stools; mild increase in ostomy output.   -  MODERATE (SCALE 4-7): Increase of 4-6 stools daily over normal; moderate increase in ostomy output. * SEVERE (SCALE 8-10; OR 'WORST POSSIBLE'): Increase of 7 or more stools daily over normal; moderate increase in ostomy output; incontinence.     Severe- things have calmed this morning 2. ONSET: "When did the diarrhea begin?"      Tuesday 3. BM CONSISTENCY: "How loose or watery is the diarrhea?"      loose 4. VOMITING: "Are you also  vomiting?" If so, ask: "How many times in the past 24 hours?"      6-7 times- vomiting has stopped-caused by bellching 5. ABDOMINAL PAIN: "Are you having any abdominal pain?" If yes: "What does it feel like?" (e.g., crampy, dull, intermittent, constant)      Some pain and nausea 6. ABDOMINAL PAIN SEVERITY: If present, ask: "How bad is the pain?"  (e.g., Scale 1-10; mild, moderate, or severe)   - MILD (1-3): doesn't interfere with normal activities, abdomen soft and not tender to touch    - MODERATE (4-7): interferes with normal activities or awakens from sleep, tender to touch    - SEVERE (8-10): excruciating pain, doubled over, unable to do any normal activities       mild 7. ORAL INTAKE: If vomiting, "Have you been able to drink liquids?" "How much fluids have you had in the past 24 hours?"     Water- not helping 8. HYDRATION: "Any signs of dehydration?" (e.g., dry mouth [not just dry lips], too weak to stand, dizziness, new weight loss) "When did you last urinate?"     Dry mouth- early this morning 9. EXPOSURE: "Have you traveled to a foreign country recently?" "Have you been exposed to anyone with diarrhea?" "Could you have eaten any food that was spoiled?"     Not sure- did have [employer]- could be viral 10. ANTIBIOTIC USE: "  Are you taking antibiotics now or have you taken antibiotics in the past 2 months?"       no 11. OTHER SYMPTOMS: "Do you have any other symptoms?" (e.g., fever, blood in stool)       no 12. PREGNANCY: "Is there any chance you are pregnant?" "When was your last menstrual period?"       n/a  Protocols used: DIARRHEA-A-AH

## 2018-06-25 NOTE — Telephone Encounter (Signed)
She can get nexium or omeprazole over the counter and take that as well but if it starts back up with vomiting / diarrhea she should have someone bring her to the ER

## 2018-06-26 ENCOUNTER — Telehealth: Payer: Self-pay

## 2018-06-26 NOTE — Telephone Encounter (Signed)
Patient states she has appointment at [facility] office for tomorrow.

## 2018-06-26 NOTE — Telephone Encounter (Signed)
Copied from [place] [id number]. Topic: Quick Communication - See Telephone Encounter >> Jun 25, 2018  4:15 PM Antonieta Iba C wrote: CRM for notification. See Telephone encounter for: 06/25/18.  Pt has been advised by PCP about current nausea and diarrhea. Pt says that she has tried what her PCP advised her to do and she is still not feeling better. I did advise pt that per message PCP is requesting that she is seen if the symptoms continue. Pt says that she would like to be advised by PCP first.

## 2018-06-26 NOTE — Telephone Encounter (Signed)
She needs appointment or go to ER  Sat clinic tomorrow if no app today

## 2018-06-27 ENCOUNTER — Encounter: Payer: Self-pay | Admitting: Family Medicine

## 2018-06-27 ENCOUNTER — Ambulatory Visit: Payer: BLUE CROSS/BLUE SHIELD | Admitting: Family Medicine

## 2018-06-27 ENCOUNTER — Ambulatory Visit: Payer: Self-pay | Admitting: Family Medicine

## 2018-06-27 VITALS — BP 118/72 | HR 81 | Temp 98.2°F | Ht 63.0 in | Wt 185.1 lb

## 2018-06-27 DIAGNOSIS — K219 Gastro-esophageal reflux disease without esophagitis: Secondary | ICD-10-CM

## 2018-06-27 DIAGNOSIS — A084 Viral intestinal infection, unspecified: Secondary | ICD-10-CM | POA: Diagnosis not present

## 2018-06-27 MED ORDER — RANITIDINE HCL 150 MG PO TABS: 150.0000 mg | ORAL_TABLET | Freq: Two times a day (BID) | ORAL | 0 refills | Status: DC

## 2018-06-27 NOTE — Patient Instructions (Addendum)
Follow-up with your PCP next week.  You can plan a probiotic at your pharmacy.  Food Choices to Help Relieve Diarrhea, Adult When you have diarrhea, the foods you eat and your eating habits are very important. Choosing the right foods and drinks can help:  Relieve diarrhea.  Replace lost fluids and nutrients.  Prevent dehydration.  What general guidelines should I follow? Relieving diarrhea  Choose foods with less than 2 g or .07 oz. of fiber per serving.  Limit fats to less than 8 tsp (38 g or 1.34 oz.) a day.  Avoid the following: ? Foods and beverages sweetened with high-fructose corn syrup, honey, or sugar alcohols such as xylitol, sorbitol, and mannitol. ? Foods that contain a lot of fat or sugar. ? Fried, greasy, or spicy foods. ? High-fiber grains, breads, and cereals. ? Raw fruits and vegetables.  Eat foods that are rich in probiotics. These foods include dairy products such as yogurt and fermented milk products. They help increase healthy bacteria in the stomach and intestines (gastrointestinal tract, or GI tract).  If you have lactose intolerance, avoid dairy products. These may make your diarrhea worse.  Take medicine to help stop diarrhea (antidiarrheal medicine) only as told by your health care provider. Replacing nutrients  Eat small meals or snacks every 3-4 hours.  Eat bland foods, such as white rice, toast, or baked potato, until your diarrhea starts to get better. Gradually reintroduce nutrient-rich foods as tolerated or as told by your health care provider. This includes: ? Well-cooked protein foods. ? Peeled, seeded, and soft-cooked fruits and vegetables. ? Low-fat dairy products.  Take vitamin and mineral supplements as told by your health care provider. Preventing dehydration   Start by sipping water or a special solution to prevent dehydration (oral rehydration solution, ORS). Urine that is clear or pale yellow means that you are getting enough  fluid.  Try to drink at least 8-10 cups of fluid each day to help replace lost fluids.  You may add other liquids in addition to water, such as clear juice or decaffeinated sports drinks, as tolerated or as told by your health care provider.  Avoid drinks with caffeine, such as coffee, tea, or soft drinks.  Avoid alcohol. What foods are recommended? The items listed may not be a complete list. Talk with your health care provider about what dietary choices are best for you. Grains White rice. White, [place], or pita breads (fresh or toasted), including plain rolls, buns, or bagels. White pasta. Saltine, soda, or graham crackers. Pretzels. Low-fiber cereal. Cooked cereals made with water (such as cornmeal, farina, or cream cereals). Plain muffins. Matzo. Melba toast. Zwieback. Vegetables Potatoes (without the skin). Most well-cooked and canned vegetables without skins or seeds. Tender lettuce. Fruits Apple sauce. Fruits canned in juice. Cooked apricots, cherries, grapefruit, peaches, pears, or plums. Fresh bananas and cantaloupe. Meats and other protein foods Baked or boiled chicken. Eggs. Tofu. Fish. Seafood. Smooth nut butters. Ground or well-cooked tender beef, ham, veal, lamb, pork, or poultry. Dairy Plain yogurt, kefir, and unsweetened liquid yogurt. Lactose-free milk, buttermilk, skim milk, or soy milk. Low-fat or nonfat hard cheese. Beverages Water. Low-calorie sports drinks. Fruit juices without pulp. Strained tomato and vegetable juices. Decaffeinated teas. Sugar-free beverages not sweetened with sugar alcohols. Oral rehydration solutions, if approved by your health care provider. Seasoning and other foods Bouillon, broth, or soups made from recommended foods. What foods are not recommended? The items listed may not be a complete list. Talk with your  health care provider about what dietary choices are best for you. Grains Whole grain, whole wheat, bran, or rye breads, rolls, pastas,  and crackers. Wild or brown rice. Whole grain or bran cereals. Barley. Oats and oatmeal. Corn tortillas or taco shells. Granola. Popcorn. Vegetables Raw vegetables. Fried vegetables. Cabbage, broccoli, Brussels sprouts, artichokes, baked beans, beet greens, corn, kale, legumes, peas, sweet potatoes, and yams. Potato skins. Cooked spinach and cabbage. Fruits Dried fruit, including raisins and dates. Raw fruits. Stewed or dried prunes. Canned fruits with syrup. Meat and other protein foods Fried or fatty meats. Deli meats. Chunky nut butters. Nuts and seeds. Beans and lentils. Berniece Salines. Hot dogs. Sausage. Dairy High-fat cheeses. Whole milk, chocolate milk, and beverages made with milk, such as milk shakes. Half-and-half. Cream. sour cream. Ice cream. Beverages Caffeinated beverages (such as coffee, tea, soda, or energy drinks). Alcoholic beverages. Fruit juices with pulp. Prune juice. Soft drinks sweetened with high-fructose corn syrup or sugar alcohols. High-calorie sports drinks. Fats and oils Butter. Cream sauces. Margarine. Salad oils. Plain salad dressings. Olives. Avocados. Mayonnaise. Sweets and desserts Sweet rolls, doughnuts, and sweet breads. Sugar-free desserts sweetened with sugar alcohols such as xylitol and sorbitol. Seasoning and other foods Honey. Hot sauce. Chili powder. Gravy. Cream-based or milk-based soups. Pancakes and waffles. Summary  When you have diarrhea, the foods you eat and your eating habits are very important.  Make sure you get at least 8-10 cups of fluid each day, or enough to keep your urine clear or pale yellow.  Eat bland foods and gradually reintroduce healthy, nutrient-rich foods as tolerated, or as told by your health care provider.  Avoid high-fiber, fried, greasy, or spicy foods. This information is not intended to replace advice given to you by your health care provider. Make sure you discuss any questions you have with your health care  provider. Document Released: 01/04/2004 Document Revised: 10/11/2016 Document Reviewed: 10/11/2016 Elsevier Interactive Patient Education  2018 [place] for Gastroesophageal Reflux Disease, Adult When you have gastroesophageal reflux disease (GERD), the foods you eat and your eating habits are very important. Choosing the right foods can help ease the discomfort of GERD. Consider working with a diet and nutrition specialist (dietitian) to help you make healthy food choices. What general guidelines should I follow? Eating plan  Choose healthy foods low in fat, such as fruits, vegetables, whole grains, low-fat dairy products, and lean meat, fish, and poultry.  Eat frequent, small meals instead of three large meals each day. Eat your meals slowly, in a relaxed setting. Avoid bending over or lying down until 2-3 hours after eating.  Limit high-fat foods such as fatty meats or fried foods.  Limit your intake of oils, butter, and shortening to less than 8 teaspoons each day.  Avoid the following: ? Foods that cause symptoms. These may be different for different people. Keep a food diary to keep track of foods that cause symptoms. ? Alcohol. ? Drinking large amounts of liquid with meals. ? Eating meals during the 2-3 hours before bed.  Cook foods using methods other than frying. This may include baking, grilling, or broiling. Lifestyle   Maintain a healthy weight. Ask your health care provider what weight is healthy for you. If you need to lose weight, work with your health care provider to do so safely.  Exercise for at least 30 minutes on 5 or more days each week, or as told by your health care provider.  Avoid wearing clothes that fit  tightly around your waist and chest.  Do not use any products that contain nicotine or tobacco, such as cigarettes and e-cigarettes. If you need help quitting, ask your health care provider.  Sleep with the head of your bed raised. Use  a wedge under the mattress or blocks under the bed frame to raise the head of the bed. What foods are not recommended? The items listed may not be a complete list. Talk with your dietitian about what dietary choices are best for you. Grains Pastries or quick breads with added fat. [place] toast. Vegetables Deep fried vegetables. [place] fries. Any vegetables prepared with added fat. Any vegetables that cause symptoms. For some people this may include tomatoes and tomato products, chili peppers, onions and garlic, and horseradish. Fruits Any fruits prepared with added fat. Any fruits that cause symptoms. For some people this may include citrus fruits, such as oranges, grapefruit, pineapple, and lemons. Meats and other protein foods High-fat meats, such as fatty beef or pork, hot dogs, ribs, ham, sausage, salami and bacon. Fried meat or protein, including fried fish and fried chicken. Nuts and nut butters. Dairy Whole milk and chocolate milk. Sour cream. Cream. Ice cream. Cream cheese. Milk shakes. Beverages Coffee and tea, with or without caffeine. Carbonated beverages. Sodas. Energy drinks. Fruit juice made with acidic fruits (such as orange or grapefruit). Tomato juice. Alcoholic drinks. Fats and oils Butter. Margarine. Shortening. Ghee. Sweets and desserts Chocolate and cocoa. Donuts. Seasoning and other foods Pepper. Peppermint and spearmint. Any condiments, herbs, or seasonings that cause symptoms. For some people, this may include curry, hot sauce, or vinegar-based salad dressings. Summary  When you have gastroesophageal reflux disease (GERD), food and lifestyle choices are very important to help ease the discomfort of GERD.  Eat frequent, small meals instead of three large meals each day. Eat your meals slowly, in a relaxed setting. Avoid bending over or lying down until 2-3 hours after eating.  Limit high-fat foods such as fatty meat or fried foods. This information is not intended  to replace advice given to you by your health care provider. Make sure you discuss any questions you have with your health care provider. Document Released: 10/14/2005 Document Revised: 10/15/2016 Document Reviewed: 10/15/2016 Elsevier Interactive Patient Education  [employer].

## 2018-06-27 NOTE — Progress Notes (Signed)
Subjective:    Patient ID: Debra [name], female    DOB: 14-Apr-1956, 62 y.o.   MRN: [id number]  Chief Complaint  Patient presents with  . Diarrhea    HPI Patient was seen today for acute concern.  Pt endorses vomiting, diarrhea, belching, flatus, acid reflux since Tuesday.  Pt states she has been eating later than she should and endorses eating [employer] which caused her reflux symptoms.  Pt is trying to eat bland foods stay hydrated.  Pt denies headache, fever, chills, blood in stool.  Pt does states her symptoms are improving.  Past Medical History:  Diagnosis Date  . Anxiety   . DDD (degenerative disc disease)   . GERD (gastroesophageal reflux disease)   . 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 Thyroid disease    Hypothyroidism    Allergies  Allergen Reactions  . Penicillins   . Codeine Rash and Other (See Comments)    dizziness    ROS General: Denies fever, chills, night sweats, changes in weight, changes in appetite HEENT: Denies headaches, ear pain, changes in vision, rhinorrhea, sore throat CV: Denies CP, palpitations, SOB, orthopnea Pulm: Denies SOB, cough, wheezing GI: Denies abdominal pain, nausea, constipation  + vomiting, diarrhea, belching, flatus GU: Denies dysuria, hematuria, frequency, vaginal discharge Msk: Denies muscle cramps, joint pains Neuro: Denies weakness, numbness, tingling Skin: Denies rashes, bruising Psych: Denies depression, anxiety, hallucinations     Objective:    Blood pressure 118/72, pulse 81, temperature 98.2 F (36.8 C), temperature source Oral, height 5\' 3"  (1.6 m), weight 185 lb 1.9 oz (84 kg), SpO2 93 %.   Gen. Pleasant, well-nourished, in no distress, normal affect  HEENT: [place]/AT, face symmetric, conjunctiva clear, no scleral icterus, PERRLA, EOMI, nares patent without drainage Lungs: no accessory muscle use, CTAB, no wheezes or rales Cardiovascular: RRR, no m/r/g, no peripheral edema Abdomen: BS present,  soft, NT/ND, no hepatosplenomegaly. Musculoskeletal: No deformities, no cyanosis or clubbing, normal tone Neuro:  A&Ox3, CN II-XII intact, normal gait Skin:  Warm, no lesions/ rash   Wt Readings from Last 3 Encounters:  06/27/18 185 lb 1.9 oz (84 kg)  04/21/18 183 lb (83 kg)  02/18/18 183 lb 6.4 oz (83.2 kg)    Lab Results  Component Value Date   WBC 5.3 01/30/2018   HGB 12.6 01/30/2018   HCT 37.4 01/30/2018   PLT 282.0 01/30/2018   GLUCOSE 101 (H) 01/30/2018   CHOL 158 01/30/2018   TRIG 83.0 01/30/2018   HDL 56.10 01/30/2018   LDLDIRECT 126.3 06/06/2011   LDLCALC 86 01/30/2018   ALT 15 01/30/2018   AST 21 01/30/2018   NA 141 01/30/2018   K 5.3 (H) 01/30/2018   CL 106 01/30/2018   CREATININE 0.70 01/30/2018   BUN 12 01/30/2018   CO2 31 01/30/2018   TSH 0.81 01/30/2018   HGBA1C 6.3 03/12/2016   MICROALBUR 3.1 (H) 11/07/2014    Assessment/Plan:  Viral gastroenteritis -improving -Symptoms likely exacerbated 2/2 GERD -Frequent handwashing advised, rehydration -Given handout  Gastroesophageal reflux disease, esophagitis presence not specified  - Plan: ranitidine (ZANTAC) 150 MG tablet  F/u with pcp next wk.  Grier Mitts, MD

## 2018-06-30 ENCOUNTER — Encounter (INDEPENDENT_AMBULATORY_CARE_PROVIDER_SITE_OTHER): Payer: BLUE CROSS/BLUE SHIELD

## 2018-07-22 ENCOUNTER — Ambulatory Visit (INDEPENDENT_AMBULATORY_CARE_PROVIDER_SITE_OTHER): Payer: BLUE CROSS/BLUE SHIELD | Admitting: Family Medicine

## 2018-07-22 ENCOUNTER — Encounter (INDEPENDENT_AMBULATORY_CARE_PROVIDER_SITE_OTHER): Payer: Self-pay | Admitting: Family Medicine

## 2018-07-22 VITALS — BP 111/75 | HR 68 | Temp 97.8°F | Ht 63.0 in | Wt 185.0 lb

## 2018-07-22 DIAGNOSIS — R5383 Other fatigue: Secondary | ICD-10-CM

## 2018-07-22 DIAGNOSIS — R739 Hyperglycemia, unspecified: Secondary | ICD-10-CM

## 2018-07-22 DIAGNOSIS — E559 Vitamin D deficiency, unspecified: Secondary | ICD-10-CM | POA: Diagnosis not present

## 2018-07-22 DIAGNOSIS — E669 Obesity, unspecified: Secondary | ICD-10-CM

## 2018-07-22 DIAGNOSIS — E038 Other specified hypothyroidism: Secondary | ICD-10-CM

## 2018-07-22 DIAGNOSIS — Z0289 Encounter for other administrative examinations: Secondary | ICD-10-CM

## 2018-07-22 DIAGNOSIS — Z1331 Encounter for screening for depression: Secondary | ICD-10-CM | POA: Diagnosis not present

## 2018-07-22 DIAGNOSIS — Z9189 Other specified personal risk factors, not elsewhere classified: Secondary | ICD-10-CM

## 2018-07-22 DIAGNOSIS — R0602 Shortness of breath: Secondary | ICD-10-CM | POA: Diagnosis not present

## 2018-07-22 DIAGNOSIS — Z6832 Body mass index (BMI) 32.0-32.9, adult: Secondary | ICD-10-CM

## 2018-07-22 DIAGNOSIS — D508 Other iron deficiency anemias: Secondary | ICD-10-CM

## 2018-07-22 NOTE — Progress Notes (Signed)
Office: [phone]  /  Fax: [phone]   Dear Dr. Carollee Herter,   Thank you for referring Debra [name] to our clinic. The following note includes my evaluation and treatment recommendations.  HPI:   Chief Complaint: OBESITY    Debra [name] has been referred by Ann Held, DO for consultation regarding her obesity and obesity related comorbidities.    Debra [name] (MR# [id number]) is a 62 y.o. female who presents on 07/22/2018 for obesity evaluation and treatment. Current BMI is Body mass index is 32.77 kg/m.Marland Kitchen Debra [name] has been struggling with her weight for many years and has been unsuccessful in either losing weight, maintaining weight loss, or reaching her healthy weight goal.      Brayton Layman attended our information session and states she is currently in the action stage of change and ready to dedicate time achieving and maintaining a healthier weight. Debra [name] is interested in becoming our patient and working on intensive lifestyle modifications including (but not limited to) diet, exercise and weight loss. Debra [name] has unrealistic weight loss goals and she struggled to follow instructions, such as, to be fasting and filling out all of her paperwork. Debra [name] wants to be vegetarian for health and moral reasons. She is very tired today and her eyes are closed through half of the session today.    Debra [name] states her desired weight loss is 55 to 60 lbs she has been heavy most of  her life she has significant food cravings issues  she snacks frequently in the evenings she skips meals frequently she is frequently drinking liquids with calories she frequently makes poor food choices she has binge eating behaviors she struggles with emotional eating    Fatigue Debra [name] feels her energy is lower than it should be. This has worsened with weight gain and has not worsened recently. Debra [name] admits to daytime somnolence and admits to waking up still tired. Patient is at risk for  obstructive sleep apnea. Patent has a history of symptoms of daytime fatigue, morning fatigue and morning headache. Patient generally gets 2 to 6 hours of sleep per night, and states they generally have restless sleep. Snoring is not present. Apneic episodes are not present. Epworth Sleepiness Score is 1  Dyspnea on exertion Debra [name] notes increasing shortness of breath with exercising and seems to be worsening over time with weight gain. She notes getting out of breath sooner with activity than she used to. This has not gotten worse recently. Debra [name] denies orthopnea.  Vitamin D deficiency Debra [name] has a diagnosis of vitamin D deficiency. Debra [name] is on vit D and there are no recent labs. She admits to fatigue and denies nausea, vomiting or muscle weakness.  Hyperglycemia Thatiana has a positive family history of diabetes and she thinks she has "borderline diabetes". Her last Hgb A1c was at 6.3. She denies polyphagia.  At risk for diabetes Debra [name] is at higher than average risk for developing diabetes due to her obesity and hyperglycemia. She currently denies polyuria and admits to polydipsia.  Hypothyroid Debra [name] has a diagnosis of hypothyroidism. She is on synthroid currently. She admits to hot or cold intolerance and fatigue.  Iron Deficiency Anemia Debra [name] has a diagnosis of anemia.  Debra [name] notes fatigue and she is on FeSO4 currently. There are no recent labs.   Depression Screen Debra [name]'s Food and Mood (modified PHQ-9) score was  Depression screen PHQ 2/9 07/22/2018  Decreased Interest 2  Down, Depressed, Hopeless 1  PHQ - 2 Score 3  Altered sleeping 2  Tired, decreased energy 2  Change in appetite 2  Feeling bad or failure about yourself  2  Trouble concentrating 3  Moving slowly or fidgety/restless 0  Suicidal thoughts 0  PHQ-9 Score 14  Difficult doing work/chores Somewhat difficult    ALLERGIES: Allergies  Allergen Reactions  . Penicillins   . Codeine Rash and Other (See  Comments)    dizziness    MEDICATIONS: Current Outpatient Medications on File Prior to Visit  Medication Sig Dispense Refill  . atomoxetine (STRATTERA) 100 MG capsule Take 1 capsule (100 mg total) by mouth daily. 90 capsule 0  . azelastine (ASTELIN) 0.1 % nasal spray Place 2 sprays into both nostrils 2 (two) times daily. Use in each nostril as directed 30 mL 2  . cyclobenzaprine (FLEXERIL) 10 MG tablet Take 1 tablet (10 mg total) by mouth at bedtime as needed for muscle spasms. 21 tablet 0  . doxycycline (VIBRA-TABS) 100 MG tablet Take 1 tablet (100 mg total) by mouth 2 (two) times daily. 20 tablet 0  . ferrous sulfate (SLOW FE) 160 (50 Fe) MG TBCR SR tablet Take 1 tablet (160 mg total) by mouth daily. 90 each 3  . gabapentin (NEURONTIN) 600 MG tablet Take 1 tablet (600 mg total) by mouth 3 (three) times daily. 270 tablet 3  . hydrochlorothiazide (HYDRODIURIL) 25 MG tablet 1/2 po qd 45 tablet 3  . levothyroxine (SYNTHROID, LEVOTHROID) 50 MCG tablet Take 1 tablet (50 mcg total) by mouth daily. 90 tablet 3  . losartan (COZAAR) 100 MG tablet Take 1 tablet (100 mg total) by mouth daily. 90 tablet 3  . metoprolol succinate (TOPROL-XL) 50 MG 24 hr tablet Take 50 mg by mouth 2 (two) times daily.  1  . montelukast (SINGULAIR) 10 MG tablet Take 1 tablet (10 mg total) by mouth at bedtime. 30 tablet 11  . NON FORMULARY [facility]  Scar Cream -  Verapamil 10%, Pentoxifylline 5% Apply 1-2 grams to affected area 3-4 times daily Qty. 120 gm 3 refills    . Omega-3 Fatty Acids (FISH OIL PO) Take 1 tablet by mouth as needed. Reported on 03/12/2016    . PARoxetine (PAXIL) 30 MG tablet Take 1 tablet (30 mg total) by mouth daily. 90 tablet 1  . pravastatin (PRAVACHOL) 20 MG tablet Take 1 tablet (20 mg total) by mouth every evening. 90 tablet 1  . predniSONE (DELTASONE) 10 MG tablet TAKE 3 TABLETS PO QD FOR 3 DAYS THEN TAKE 2 TABLETS PO QD FOR 3 DAYS THEN TAKE 1 TABLET PO QD FOR 3 DAYS THEN TAKE 1/2 TAB  PO QD FOR 3 DAYS 20 tablet 0  . ranitidine (ZANTAC) 150 MG tablet Take 1 tablet (150 mg total) by mouth 2 (two) times daily. 60 tablet 0  . varenicline (CHANTIX CONTINUING MONTH PAK) 1 MG tablet Take 1 tablet (1 mg total) by mouth 2 (two) times daily. 60 tablet 2  . varenicline (CHANTIX STARTING MONTH PAK) 0.5 MG X 11 & 1 MG X 42 tablet Take one 0.5 mg tablet by mouth once daily for 3 days, then increase to one 0.5 mg tablet twice daily for 4 days, then increase to one 1 mg tablet twice daily. 53 tablet 0   No current facility-administered medications on file prior to visit.     PAST MEDICAL HISTORY: Past Medical History:  Diagnosis Date  . Anemia   . Anxiety   . Back pain   . Chest pain   . Constipation   .  DDD (degenerative disc disease)   . Depression   . Diabetes ([place])   . Epilepsy ([place])   . GERD (gastroesophageal reflux disease)   . HLD (hyperlipidemia)   . HTN (hypertension)   . Joint pain   . Leg edema   . 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 Osteoarthritis   . Sleep apnea   . Stomach ulcer   . Swallowing difficulty   . Thyroid disease    Hypothyroidism  . Vitamin D deficiency     PAST SURGICAL HISTORY: Past Surgical History:  Procedure Laterality Date  . CARPAL TUNNEL RELEASE     Bilateral  . CERVICAL SPINE SURGERY     x3  . LEG SURGERY     Right   . WRIST FRACTURE SURGERY  10-02-15   Pt have surgery twice on the same wrist.    SOCIAL HISTORY: Social History   Tobacco Use  . Smoking status: Current Every Day Smoker    Packs/day: 0.75    Years: 44.00    Pack years: 33.00    Types: Cigarettes  . Smokeless tobacco: Never Used  . Tobacco comment: Started Chantix 08/24/2017  Substance Use Topics  . Alcohol use: Yes    Alcohol/week: 0.0 standard drinks    Comment: 2-3 drinks per week (beer/wine)  . Drug use: No    FAMILY HISTORY: Family History  Problem Relation Age of Onset  . Breast cancer Mother        possibly 74's  .  Arthritis Mother        rheumatoid  . Cancer Mother        breast  . Heart disease Mother 73       MI  . Hyperlipidemia Mother   . Thyroid disease Mother   . Depression Mother   . Anxiety disorder Mother   . Heart disease Father        MI  . Hypertension Father   . Stroke Father   . Diabetes Sister   . Hypertension Sister   . Hyperlipidemia Sister   . Colon cancer Maternal Grandfather     ROS: Review of Systems  Constitutional: Positive for malaise/fatigue.       +Fever/Chills  HENT: Positive for congestion (nasal stuffiness), hearing loss and sinus pain.        + Hay Fever + Dry Mouth + Hoarseness  Eyes:       + Vision Changes + Floaters  Respiratory: Positive for cough and shortness of breath (with activity).   Cardiovascular: Positive for palpitations. Negative for orthopnea.       + Leg Cramping + Very Cold Feet or Hands   Gastrointestinal: Positive for heartburn and nausea. Negative for vomiting.  Genitourinary: Negative for frequency.  Musculoskeletal: Positive for back pain.       + Neck Pain + Neck Sriffness + Muscle or Joint Pain + Muscle Stiffness + Red or Swollen Joints Negative for muscle weakness    Skin: Positive for itching.       + Hair or Nail Changes  Neurological: Positive for dizziness and seizures.  Endo/Heme/Allergies: Positive for polydipsia. Bruises/bleeds easily (bruising).       + Heat or Cold Intolerance  Psychiatric/Behavioral: Positive for depression. The patient is nervous/anxious (nervousness) and has insomnia.        + Stress     PHYSICAL EXAM: Blood pressure 111/75, pulse 68, temperature 97.8 F (36.6 C), temperature source Oral, height 5\' 3"  (1.6  m), weight 185 lb (83.9 kg), SpO2 95 %. Body mass index is 32.77 kg/m. Physical Exam  RECENT LABS AND TESTS: BMET    Component Value Date/Time   NA 141 01/30/2018 1231   K 5.3 (H) 01/30/2018 1231   CL 106 01/30/2018 1231   CO2 31 01/30/2018 1231   GLUCOSE 101 (H)  01/30/2018 1231   GLUCOSE 90 10/20/2006 1125   BUN 12 01/30/2018 1231   CREATININE 0.70 01/30/2018 1231   CREATININE 0.80 07/10/2012 1715   CALCIUM 9.6 01/30/2018 1231   GFRNONAA >90 08/31/2014 1720   GFRAA >90 08/31/2014 1720   Lab Results  Component Value Date   HGBA1C 6.3 03/12/2016   No results found for: INSULIN CBC    Component Value Date/Time   WBC 5.3 01/30/2018 1231   RBC 4.36 01/30/2018 1231   HGB 12.6 01/30/2018 1231   HCT 37.4 01/30/2018 1231   PLT 282.0 01/30/2018 1231   MCV 85.7 01/30/2018 1231   MCH 27.9 08/31/2014 1720   MCHC 33.7 01/30/2018 1231   RDW 13.6 01/30/2018 1231   LYMPHSABS 2.0 01/30/2018 1231   MONOABS 0.4 01/30/2018 1231   EOSABS 0.0 01/30/2018 1231   BASOSABS 0.0 01/30/2018 1231   Iron/TIBC/Ferritin/ %Sat    Component Value Date/Time   IRON 69 02/21/2015 1225   TIBC 290 02/21/2015 1225   FERRITIN 78.8 02/21/2015 1225   IRONPCTSAT 24 02/21/2015 1225   Lipid Panel     Component Value Date/Time   CHOL 158 01/30/2018 1231   TRIG 83.0 01/30/2018 1231   HDL 56.10 01/30/2018 1231   CHOLHDL 3 01/30/2018 1231   VLDL 16.6 01/30/2018 1231   LDLCALC 86 01/30/2018 1231   LDLDIRECT 126.3 06/06/2011 1533   Hepatic Function Panel     Component Value Date/Time   PROT 7.7 01/30/2018 1231   ALBUMIN 4.1 01/30/2018 1231   AST 21 01/30/2018 1231   ALT 15 01/30/2018 1231   ALKPHOS 88 01/30/2018 1231   BILITOT 0.5 01/30/2018 1231   BILIDIR 0.1 02/21/2015 1417   IBILI 0.3 07/10/2012 1715      Component Value Date/Time   TSH 0.81 01/30/2018 1231   TSH 1.44 08/12/2017 1130   TSH 1.88 09/10/2016 1232    ECG  shows NSR with a rate of 75 BPM INDIRECT CALORIMETER done today shows a VO2 of 242 and a REE of 1687.  Her calculated basal metabolic rate is 7824 thus her basal metabolic rate is better than expected.    ASSESSMENT AND PLAN: Other fatigue - Plan: EKG 12-Lead, Vitamin B12, Folate, Lipid Panel With LDL/HDL Ratio, Comprehensive metabolic  panel  Shortness of breath on exertion  Hyperglycemia - Plan: Hemoglobin A1c, Insulin, random, Comprehensive metabolic panel  Vitamin D deficiency  Other specified hypothyroidism - Plan: T3, T4, free, TSH, VITAMIN D 25 Hydroxy (Vit-D Deficiency, Fractures)  Other iron deficiency anemia  Depression screening - Plan: Anemia panel, CBC With Differential  At risk for diabetes mellitus  Class 1 obesity with serious comorbidity and body mass index (BMI) of 32.0 to 32.9 in adult, unspecified obesity type  PLAN: Fatigue Zayleigh was informed that her fatigue may be related to obesity, depression or many other causes. Labs will be ordered, and in the meanwhile Nava has agreed to work on diet, exercise and weight loss to help with fatigue. Proper sleep hygiene was discussed including the need for 7-8 hours of quality sleep each night. A sleep study was not ordered based on symptoms and Epworth score.  Dyspnea on exertion Francely's shortness of breath appears to be obesity related and exercise induced. She has agreed to work on weight loss and gradually increase exercise to treat her exercise induced shortness of breath. If [facility] follows our instructions and loses weight without improvement of her shortness of breath, we will plan to refer to pulmonology. We will monitor this condition regularly. Audryna agrees to this plan.  Vitamin D Deficiency Irvin was informed that low vitamin D levels contributes to fatigue and are associated with obesity, breast, and colon cancer. We will check labs and will follow up for routine testing of vitamin D, at least 2-3 times per year. She was informed of the risk of over-replacement of vitamin D and agrees to not increase her dose unless she discusses this with [place] first.  Hyperglycemia Fasting labs will be obtained and results with be discussed with [facility] in 2 weeks at her follow up visit. In the meanwhile Mabell was started on a lower simple carbohydrate diet  and will work on weight loss efforts.  Diabetes risk counseling Analese was given extended (15 minutes) diabetes prevention counseling today. She is 62 y.o. female and has risk factors for diabetes including obesity and hyperglycemia. We discussed intensive lifestyle modifications today with an emphasis on weight loss as well as increasing exercise and decreasing simple carbohydrates in her diet.  Iron Deficiency Anemia The diagnosis of Iron deficiency anemia was discussed with [facility] and was explained in detail. She was given suggestions of iron rich foods and she will continue her iron supplement. We will check labs and follow.  Depression Screen Adriyana had a moderately positive depression screening. Depression is commonly associated with obesity and often results in emotional eating behaviors. We will monitor this closely and work on CBT to help improve the non-hunger eating patterns. Referral to Psychology may be required if no improvement is seen as she continues in our clinic.  Obesity Modestine is currently in the action stage of change and her goal is to continue with weight loss efforts. I recommend Kurstyn begin the structured treatment plan as follows:  She has agreed to follow the Category 2 plan Jessly has been instructed to eventually work up to a goal of 150 minutes of combined cardio and strengthening exercise per week for weight loss and overall health benefits. We discussed the following Behavioral Modification Strategies today: increasing lean protein intake, decreasing simple carbohydrates  and work on meal planning and easy cooking plans   She was informed of the importance of frequent follow up visits to maximize her success with intensive lifestyle modifications for her multiple health conditions. She was informed we would discuss her lab results at her next visit unless there is a critical issue that needs to be addressed sooner. Noha agreed to keep her next visit at the  agreed upon time to discuss these results.    OBESITY BEHAVIORAL INTERVENTION VISIT  Today's visit was # 1   Starting weight: 185 lbs Starting date: 07/22/18 Today's weight : 185 lbs  Today's date: 07/22/2018 Total lbs lost to date: 0   ASK: We discussed the diagnosis of obesity with Debra [name] today and [facility] agreed to give [place] permission to discuss obesity behavioral modification therapy today.  ASSESS: Connor has the diagnosis of obesity and her BMI today is 32.78 Denee is in the action stage of change   ADVISE: Moni was educated on the multiple health risks of obesity as well as the benefit of weight loss to  improve her health. She was advised of the need for long term treatment and the importance of lifestyle modifications to improve her current health and to decrease her risk of future health problems.  AGREE: Multiple dietary modification options and treatment options were discussed and  Montina agreed to follow the recommendations documented in the above note.  ARRANGE: Margarie was educated on the importance of frequent visits to treat obesity as outlined per CMS and USPSTF guidelines and agreed to schedule her next follow up appointment today.  I, Doreene Nest, am acting as transcriptionist for Dennard Nip, MD  I have reviewed the above documentation for accuracy and completeness, and I agree with the above. -Dennard Nip, MD

## 2018-07-23 LAB — COMPREHENSIVE METABOLIC PANEL
ALK PHOS: 104 IU/L (ref 39–117)
ALT: 17 IU/L (ref 0–32)
AST: 22 IU/L (ref 0–40)
Albumin/Globulin Ratio: 1.4 (ref 1.2–2.2)
Albumin: 4.3 g/dL (ref 3.6–4.8)
BILIRUBIN TOTAL: 0.4 mg/dL (ref 0.0–1.2)
BUN/Creatinine Ratio: 16 (ref 12–28)
BUN: 12 mg/dL (ref 8–27)
CHLORIDE: 102 mmol/L (ref 96–106)
CO2: 24 mmol/L (ref 20–29)
Calcium: 10 mg/dL (ref 8.7–10.3)
Creatinine, Ser: 0.73 mg/dL (ref 0.57–1.00)
GFR calc Af Amer: 102 mL/min/{1.73_m2} (ref >59)
GFR calc non Af Amer: 89 mL/min/{1.73_m2} (ref >59)
GLUCOSE: 108 mg/dL — AB (ref 65–99)
Globulin, Total: 3.1 g/dL (ref 1.5–4.5)
Potassium: 5.1 mmol/L (ref 3.5–5.2)
Sodium: 140 mmol/L (ref 134–144)
TOTAL PROTEIN: 7.4 g/dL (ref 6.0–8.5)

## 2018-07-23 LAB — T3: T3 TOTAL: 52 ng/dL — AB (ref 71–180)

## 2018-07-23 LAB — HEMOGLOBIN A1C
ESTIMATED AVERAGE GLUCOSE: 143 mg/dL
Hgb A1c MFr Bld: 6.6 % — ABNORMAL HIGH (ref 4.8–5.6)

## 2018-07-23 LAB — CBC WITH DIFFERENTIAL
BASOS: 0 %
Basophils Absolute: 0 10*3/uL (ref 0.0–0.2)
EOS (ABSOLUTE): 0.1 10*3/uL (ref 0.0–0.4)
Eos: 1 %
HEMOGLOBIN: 11.5 g/dL (ref 11.1–15.9)
IMMATURE GRANS (ABS): 0 10*3/uL (ref 0.0–0.1)
Immature Granulocytes: 0 %
LYMPHS ABS: 2.4 10*3/uL (ref 0.7–3.1)
LYMPHS: 36 %
MCH: 27.1 pg (ref 26.6–33.0)
MCHC: 32.6 g/dL (ref 31.5–35.7)
MCV: 83 fL (ref 79–97)
MONOS ABS: 0.5 10*3/uL (ref 0.1–0.9)
Monocytes: 8 %
NEUTROS ABS: 3.6 10*3/uL (ref 1.4–7.0)
Neutrophils: 55 %
RBC: 4.25 x10E6/uL (ref 3.77–5.28)
RDW: 12.5 % (ref 12.3–15.4)
WBC: 6.6 10*3/uL (ref 3.4–10.8)

## 2018-07-23 LAB — ANEMIA PANEL
FERRITIN: 257 ng/mL — AB (ref 15–150)
FOLATE, HEMOLYSATE: 445.2 ng/mL
Folate, RBC: 1261 ng/mL (ref >498)
Hematocrit: 35.3 % (ref 34.0–46.6)
IRON SATURATION: 24 % (ref 15–55)
Iron: 67 ug/dL (ref 27–139)
RETIC CT PCT: 1.2 % (ref 0.6–2.6)
Total Iron Binding Capacity: 276 ug/dL (ref 250–450)
UIBC: 209 ug/dL (ref 118–369)
VITAMIN B 12: 520 pg/mL (ref 232–1245)

## 2018-07-23 LAB — LIPID PANEL WITH LDL/HDL RATIO
CHOLESTEROL TOTAL: 206 mg/dL — AB (ref 100–199)
HDL: 48 mg/dL (ref >39)
LDL Calculated: 123 mg/dL — ABNORMAL HIGH (ref 0–99)
LDl/HDL Ratio: 2.6 ratio (ref 0.0–3.2)
Triglycerides: 174 mg/dL — ABNORMAL HIGH (ref 0–149)
VLDL Cholesterol Cal: 35 mg/dL (ref 5–40)

## 2018-07-23 LAB — FOLATE: Folate: >20 ng/mL (ref >3.0)

## 2018-07-23 LAB — VITAMIN D 25 HYDROXY (VIT D DEFICIENCY, FRACTURES): VIT D 25 HYDROXY: 26.2 ng/mL — AB (ref 30.0–100.0)

## 2018-07-23 LAB — TSH: TSH: 2.09 u[IU]/mL (ref 0.450–4.500)

## 2018-07-23 LAB — T4, FREE: FREE T4: 0.82 ng/dL (ref 0.82–1.77)

## 2018-07-23 LAB — INSULIN, RANDOM: INSULIN: 14.2 u[IU]/mL (ref 2.6–24.9)

## 2018-07-27 DIAGNOSIS — Z6839 Body mass index (BMI) 39.0-39.9, adult: Secondary | ICD-10-CM | POA: Diagnosis not present

## 2018-07-27 DIAGNOSIS — Z124 Encounter for screening for malignant neoplasm of cervix: Secondary | ICD-10-CM | POA: Diagnosis not present

## 2018-07-27 DIAGNOSIS — Z01419 Encounter for gynecological examination (general) (routine) without abnormal findings: Secondary | ICD-10-CM | POA: Diagnosis not present

## 2018-07-27 DIAGNOSIS — N898 Other specified noninflammatory disorders of vagina: Secondary | ICD-10-CM | POA: Diagnosis not present

## 2018-08-03 ENCOUNTER — Encounter: Payer: Self-pay | Admitting: Family Medicine

## 2018-08-03 ENCOUNTER — Ambulatory Visit (INDEPENDENT_AMBULATORY_CARE_PROVIDER_SITE_OTHER): Payer: BLUE CROSS/BLUE SHIELD | Admitting: Family Medicine

## 2018-08-03 VITALS — BP 122/83 | HR 70 | Temp 98.3°F | Resp 16 | Ht 63.0 in | Wt 188.2 lb

## 2018-08-03 DIAGNOSIS — R739 Hyperglycemia, unspecified: Secondary | ICD-10-CM | POA: Diagnosis not present

## 2018-08-03 DIAGNOSIS — Z0000 Encounter for general adult medical examination without abnormal findings: Secondary | ICD-10-CM | POA: Diagnosis not present

## 2018-08-03 DIAGNOSIS — E785 Hyperlipidemia, unspecified: Secondary | ICD-10-CM

## 2018-08-03 DIAGNOSIS — I10 Essential (primary) hypertension: Secondary | ICD-10-CM | POA: Diagnosis not present

## 2018-08-03 DIAGNOSIS — E039 Hypothyroidism, unspecified: Secondary | ICD-10-CM | POA: Diagnosis not present

## 2018-08-03 DIAGNOSIS — F17200 Nicotine dependence, unspecified, uncomplicated: Secondary | ICD-10-CM | POA: Diagnosis not present

## 2018-08-03 DIAGNOSIS — T7840XD Allergy, unspecified, subsequent encounter: Secondary | ICD-10-CM

## 2018-08-03 DIAGNOSIS — G8929 Other chronic pain: Secondary | ICD-10-CM

## 2018-08-03 DIAGNOSIS — Z23 Encounter for immunization: Secondary | ICD-10-CM | POA: Diagnosis not present

## 2018-08-03 DIAGNOSIS — F411 Generalized anxiety disorder: Secondary | ICD-10-CM

## 2018-08-03 MED ORDER — VARENICLINE TARTRATE 1 MG PO TABS: 1.0000 mg | ORAL_TABLET | Freq: Two times a day (BID) | ORAL | 2 refills | Status: DC

## 2018-08-03 MED ORDER — LEVOTHYROXINE SODIUM 50 MCG PO TABS: 50.0000 ug | ORAL_TABLET | Freq: Every day | ORAL | 3 refills | Status: DC

## 2018-08-03 MED ORDER — GABAPENTIN 600 MG PO TABS: 600.0000 mg | ORAL_TABLET | Freq: Three times a day (TID) | ORAL | 3 refills | Status: DC

## 2018-08-03 MED ORDER — PRAVASTATIN SODIUM 20 MG PO TABS: 20.0000 mg | ORAL_TABLET | Freq: Every evening | ORAL | 1 refills | Status: DC

## 2018-08-03 MED ORDER — PAROXETINE HCL 30 MG PO TABS: 30.0000 mg | ORAL_TABLET | Freq: Every day | ORAL | 1 refills | Status: DC

## 2018-08-03 MED ORDER — MONTELUKAST SODIUM 10 MG PO TABS: 10.0000 mg | ORAL_TABLET | Freq: Every day | ORAL | 11 refills | Status: DC

## 2018-08-03 MED ORDER — HYDROCHLOROTHIAZIDE 25 MG PO TABS: ORAL_TABLET | ORAL | 3 refills | Status: DC

## 2018-08-03 MED ORDER — LOSARTAN POTASSIUM 100 MG PO TABS: 100.0000 mg | ORAL_TABLET | Freq: Every day | ORAL | 3 refills | Status: DC

## 2018-08-03 NOTE — Patient Instructions (Signed)
Preventive Care 40-64 Years, Female Preventive care refers to lifestyle choices and visits with your health care provider that can promote health and wellness. What does preventive care include?  A yearly physical exam. This is also called an annual well check.  Dental exams once or twice a year.  Routine eye exams. Ask your health care provider how often you should have your eyes checked.  Personal lifestyle choices, including: ? Daily care of your teeth and gums. ? Regular physical activity. ? Eating a healthy diet. ? Avoiding tobacco and drug use. ? Limiting alcohol use. ? Practicing safe sex. ? Taking low-dose aspirin daily starting at age 58. ? Taking vitamin and mineral supplements as recommended by your health care provider. What happens during an annual well check? The services and screenings done by your health care provider during your annual well check will depend on your age, overall health, lifestyle risk factors, and family history of disease. Counseling Your health care provider may ask you questions about your:  Alcohol use.  Tobacco use.  Drug use.  Emotional well-being.  Home and relationship well-being.  Sexual activity.  Eating habits.  Work and work Statistician.  Method of birth control.  Menstrual cycle.  Pregnancy history.  Screening You may have the following tests or measurements:  Height, weight, and BMI.  Blood pressure.  Lipid and cholesterol levels. These may be checked every 5 years, or more frequently if you are over 81 years old.  Skin check.  Lung cancer screening. You may have this screening every year starting at age 78 if you have a 30-pack-year history of smoking and currently smoke or have quit within the past 15 years.  Fecal occult blood test (FOBT) of the stool. You may have this test every year starting at age 65.  Flexible sigmoidoscopy or colonoscopy. You may have a sigmoidoscopy every 5 years or a colonoscopy  every 10 years starting at age 30.  Hepatitis C blood test.  Hepatitis B blood test.  Sexually transmitted disease (STD) testing.  Diabetes screening. This is done by checking your blood sugar (glucose) after you have not eaten for a while (fasting). You may have this done every 1-3 years.  Mammogram. This may be done every 1-2 years. Talk to your health care provider about when you should start having regular mammograms. This may depend on whether you have a family history of breast cancer.  BRCA-related cancer screening. This may be done if you have a family history of breast, ovarian, tubal, or peritoneal cancers.  Pelvic exam and Pap test. This may be done every 3 years starting at age 80. Starting at age 36, this may be done every 5 years if you have a Pap test in combination with an HPV test.  Bone density scan. This is done to screen for osteoporosis. You may have this scan if you are at high risk for osteoporosis.  Discuss your test results, treatment options, and if necessary, the need for more tests with your health care provider. Vaccines Your health care provider may recommend certain vaccines, such as:  Influenza vaccine. This is recommended every year.  Tetanus, diphtheria, and acellular pertussis (Tdap, Td) vaccine. You may need a Td booster every 10 years.  Varicella vaccine. You may need this if you have not been vaccinated.  Zoster vaccine. You may need this after age 5.  Measles, mumps, and rubella (MMR) vaccine. You may need at least one dose of MMR if you were born in  1958 or later. You may also need a second dose.  Pneumococcal 13-valent conjugate (PCV13) vaccine. You may need this if you have certain conditions and were not previously vaccinated.  Pneumococcal polysaccharide (PPSV23) vaccine. You may need one or two doses if you smoke cigarettes or if you have certain conditions.  Meningococcal vaccine. You may need this if you have certain  conditions.  Hepatitis A vaccine. You may need this if you have certain conditions or if you travel or work in places where you may be exposed to hepatitis A.  Hepatitis B vaccine. You may need this if you have certain conditions or if you travel or work in places where you may be exposed to hepatitis B.  Haemophilus influenzae type b (Hib) vaccine. You may need this if you have certain conditions.  Talk to your health care provider about which screenings and vaccines you need and how often you need them. This information is not intended to replace advice given to you by your health care provider. Make sure you discuss any questions you have with your health care provider. Document Released: 11/10/2015 Document Revised: 07/03/2016 Document Reviewed: 08/15/2015 Elsevier Interactive Patient Education  2018 [employer]  

## 2018-08-03 NOTE — Progress Notes (Signed)
Subjective:     Debra [name] is a 62 y.o. female and is here for a comprehensive physical exam. The patient reports no new problems .  seeing gyn and going to healthy weight and wellness.  Social History   Socioeconomic History  . Marital status: Legally Separated    Spouse name: Not on file  . Number of children: 3  . Years of education: Not on file  . Highest education level: Not on file  Occupational History  . Occupation: Ship broker  Social Needs  . Financial resource strain: Not on file  . Food insecurity:    Worry: Not on file    Inability: Not on file  . Transportation needs:    Medical: Not on file    Non-medical: Not on file  Tobacco Use  . Smoking status: Current Every Day Smoker    Packs/day: 0.75    Years: 44.00    Pack years: 33.00    Types: Cigarettes  . Smokeless tobacco: Never Used  . Tobacco comment: Started Chantix 08/24/2017  Substance and Sexual Activity  . Alcohol use: Yes    Alcohol/week: 0.0 standard drinks    Comment: 2-3 drinks per week (beer/wine)  . Drug use: No  . Sexual activity: Yes    Partners: Male    Birth control/protection: Post-menopausal  Lifestyle  . Physical activity:    Days per week: Not on file    Minutes per session: Not on file  . Stress: Not on file  Relationships  . Social connections:    Talks on phone: Not on file    Gets together: Not on file    Attends religious service: Not on file    Active member of club or organization: Not on file    Attends meetings of clubs or organizations: Not on file    Relationship status: Not on file  . Intimate partner violence:    Fear of current or ex partner: Not on file    Emotionally abused: Not on file    Physically abused: Not on file    Forced sexual activity: Not on file  Other Topics Concern  . Not on file  Social History Narrative  . Not on file   Health Maintenance  Topic Date Due  . PAP SMEAR  02/20/2018  . MAMMOGRAM  11/14/2018  . DEXA SCAN  04/09/2019  .  COLONOSCOPY  07/28/2019  . TETANUS/TDAP  11/02/2020  . INFLUENZA VACCINE  Completed  . Hepatitis C Screening  Completed  . HIV Screening  Completed    The following portions of the patient's history were reviewed and updated as appropriate:  She  has a past medical history of Anemia, Anxiety, Back pain, Chest pain, Constipation, DDD (degenerative disc disease), Depression, Diabetes ([place]), Epilepsy ([place]), GERD (gastroesophageal reflux disease), HLD (hyperlipidemia), HTN (hypertension), Joint pain, Leg edema, Mixed connective tissue disease ([place]), Osteoarthritis, Sleep apnea, Stomach ulcer, Swallowing difficulty, Thyroid disease, and Vitamin D deficiency. She does not have any pertinent problems on file. She  has a past surgical history that includes Carpal tunnel release; Leg Surgery; Cervical spine surgery; and Wrist fracture surgery (10-02-15). Her family history includes Anxiety disorder in her mother; Arthritis in her mother; Breast cancer in her mother; Cancer in her mother; Colon cancer in her maternal grandfather; Depression in her mother; Diabetes in her sister; Heart disease in her father; Heart disease (age of onset: 84) in her mother; Hyperlipidemia in her mother and sister; Hypertension in her father and sister;  Stroke in her father; Thyroid disease in her mother. She  reports that she has been smoking cigarettes. She has a 33.00 pack-year smoking history. She has never used smokeless tobacco. She reports that she drinks alcohol. She reports that she does not use drugs. She has a current medication list which includes the following prescription(s): atomoxetine, azelastine, ferrous sulfate, gabapentin, hydrochlorothiazide, levothyroxine, losartan, metoprolol succinate, montelukast, NON FORMULARY, omega-3 fatty acids, paroxetine, pravastatin, prednisone, ranitidine, varenicline, and varenicline. Current Outpatient Medications on File Prior to Visit  Medication Sig Dispense Refill  .  atomoxetine (STRATTERA) 100 MG capsule Take 1 capsule (100 mg total) by mouth daily. 90 capsule 0  . azelastine (ASTELIN) 0.1 % nasal spray Place 2 sprays into both nostrils 2 (two) times daily. Use in each nostril as directed 30 mL 2  . ferrous sulfate (SLOW FE) 160 (50 Fe) MG TBCR SR tablet Take 1 tablet (160 mg total) by mouth daily. 90 each 3  . metoprolol succinate (TOPROL-XL) 50 MG 24 hr tablet Take 50 mg by mouth 2 (two) times daily.  1  . NON FORMULARY [facility]  Scar Cream -  Verapamil 10%, Pentoxifylline 5% Apply 1-2 grams to affected area 3-4 times daily Qty. 120 gm 3 refills    . Omega-3 Fatty Acids (FISH OIL PO) Take 1 tablet by mouth as needed. Reported on 03/12/2016    . predniSONE (DELTASONE) 10 MG tablet TAKE 3 TABLETS PO QD FOR 3 DAYS THEN TAKE 2 TABLETS PO QD FOR 3 DAYS THEN TAKE 1 TABLET PO QD FOR 3 DAYS THEN TAKE 1/2 TAB PO QD FOR 3 DAYS 20 tablet 0  . ranitidine (ZANTAC) 150 MG tablet Take 1 tablet (150 mg total) by mouth 2 (two) times daily. 60 tablet 0  . varenicline (CHANTIX STARTING MONTH PAK) 0.5 MG X 11 & 1 MG X 42 tablet Take one 0.5 mg tablet by mouth once daily for 3 days, then increase to one 0.5 mg tablet twice daily for 4 days, then increase to one 1 mg tablet twice daily. 53 tablet 0   No current facility-administered medications on file prior to visit.    She is allergic to penicillins and codeine..  Review of Systems Review of Systems  Constitutional: Negative for activity change, appetite change and fatigue.  HENT: Negative for hearing loss, congestion, tinnitus and ear discharge.  dentist q85m Eyes: Negative for visual disturbance (see optho q1y -- vision corrected to 20/20 with glasses).  Respiratory: Negative for cough, chest tightness and shortness of breath.   Cardiovascular: Negative for chest pain, palpitations and leg swelling.  Gastrointestinal: Negative for abdominal pain, diarrhea, constipation and abdominal distention.  Genitourinary:  Negative for urgency, frequency, decreased urine volume and difficulty urinating.  Musculoskeletal: Negative for back pain, arthralgias and gait problem.  Skin: Negative for color change, pallor and rash.  Neurological: Negative for dizziness, light-headedness, numbness and headaches.  Hematological: Negative for adenopathy. Does not bruise/bleed easily.  Psychiatric/Behavioral: Negative for suicidal ideas, confusion, sleep disturbance, self-injury, dysphoric mood, decreased concentration and agitation.       Objective:    BP 122/83 (BP Location: Right Arm, Cuff Size: Large)   Pulse 70   Temp 98.3 F (36.8 C) (Oral)   Resp 16   Ht 5\' 3"  (1.6 m)   Wt 188 lb 3.2 oz (85.4 kg)   SpO2 100%   BMI 33.34 kg/m  General appearance: alert, cooperative, appears stated age and no distress Head: Normocephalic, without obvious abnormality, atraumatic Eyes:  conjunctivae/corneas clear. PERRL, EOM's intact. Fundi benign. Ears: normal TM's and external ear canals both ears Nose: Nares normal. Septum midline. Mucosa normal. No drainage or sinus tenderness. Throat: lips, mucosa, and tongue normal; teeth and gums normal Neck: no adenopathy, no carotid bruit, no JVD, supple, symmetrical, trachea midline and thyroid not enlarged, symmetric, no tenderness/mass/nodules Back: symmetric, no curvature. ROM normal. No CVA tenderness. Lungs: clear to auscultation bilaterally Breasts: gyn Heart: regular rate and rhythm, S1, S2 normal, no murmur, click, rub or gallop Abdomen: soft, non-tender; bowel sounds normal; no masses,  no organomegaly Pelvic: deferred--gyn Extremities: extremities normal, atraumatic, no cyanosis or edema Pulses: 2+ and symmetric Skin: Skin color, texture, turgor normal. No rashes or lesions Lymph nodes: Cervical, supraclavicular, and axillary nodes normal. Neurologic: Alert and oriented X 3, normal strength and tone. Normal symmetric reflexes. Normal coordination and gait     Assessment:    Healthy female exam.      Plan:  Check labs  ghm utd See After Visit Summary for Counseling Recommendations    1. Influenza vaccine administered  - Flu Vaccine QUAD 6+ mos PF IM (Fluarix Quad PF)  2. Hyperglycemia Check labs   3. Essential hypertension Well controlled, no changes to meds. Encouraged heart healthy diet such as the DASH diet and exercise as tolerated.   - losartan (COZAAR) 100 MG tablet; Take 1 tablet (100 mg total) by mouth daily.  Dispense: 90 tablet; Refill: 3 - hydrochlorothiazide (HYDRODIURIL) 25 MG tablet; 1/2 po qd  Dispense: 45 tablet; Refill: 3  4. Hypothyroidism, unspecified type Stable Check labs  - levothyroxine (SYNTHROID, LEVOTHROID) 50 MCG tablet; Take 1 tablet (50 mcg total) by mouth daily.  Dispense: 90 tablet; Refill: 3  5. TOBACCO USER  - varenicline (CHANTIX CONTINUING MONTH PAK) 1 MG tablet; Take 1 tablet (1 mg total) by mouth 2 (two) times daily.  Dispense: 60 tablet; Refill: 2  6. Hyperlipidemia, unspecified hyperlipidemia type Encouraged heart healthy diet, increase exercise, avoid trans fats, consider a krill oil cap daily - pravastatin (PRAVACHOL) 20 MG tablet; Take 1 tablet (20 mg total) by mouth every evening.  Dispense: 90 tablet; Refill: 1  7. Generalized anxiety disorder stable - PARoxetine (PAXIL) 30 MG tablet; Take 1 tablet (30 mg total) by mouth daily.  Dispense: 90 tablet; Refill: 1  8. Other chronic pain  - gabapentin (NEURONTIN) 600 MG tablet; Take 1 tablet (600 mg total) by mouth 3 (three) times daily.  Dispense: 270 tablet; Refill: 3  9. Allergic state, subsequent encounter  - montelukast (SINGULAIR) 10 MG tablet; Take 1 tablet (10 mg total) by mouth at bedtime.  Dispense: 30 tablet; Refill: 11  10. Preventative health care ghm utd Check labs   11. Immunization due  - Tdap vaccine greater than or equal to 7yo IM

## 2018-08-05 ENCOUNTER — Ambulatory Visit (INDEPENDENT_AMBULATORY_CARE_PROVIDER_SITE_OTHER): Payer: BLUE CROSS/BLUE SHIELD | Admitting: Family Medicine

## 2018-08-05 VITALS — BP 116/74 | HR 80 | Temp 97.8°F | Ht 63.0 in | Wt 183.0 lb

## 2018-08-05 DIAGNOSIS — Z6832 Body mass index (BMI) 32.0-32.9, adult: Secondary | ICD-10-CM

## 2018-08-05 DIAGNOSIS — E669 Obesity, unspecified: Secondary | ICD-10-CM

## 2018-08-05 DIAGNOSIS — E119 Type 2 diabetes mellitus without complications: Secondary | ICD-10-CM

## 2018-08-05 DIAGNOSIS — Z9189 Other specified personal risk factors, not elsewhere classified: Secondary | ICD-10-CM | POA: Diagnosis not present

## 2018-08-05 DIAGNOSIS — F5081 Binge eating disorder: Secondary | ICD-10-CM | POA: Diagnosis not present

## 2018-08-05 DIAGNOSIS — E559 Vitamin D deficiency, unspecified: Secondary | ICD-10-CM | POA: Diagnosis not present

## 2018-08-05 MED ORDER — ACCU-CHEK AVIVA DEVI: 0 refills | Status: AC

## 2018-08-05 MED ORDER — METFORMIN HCL 500 MG PO TABS: 500.0000 mg | ORAL_TABLET | Freq: Every day | ORAL | 0 refills | Status: DC

## 2018-08-05 MED ORDER — VITAMIN D (ERGOCALCIFEROL) 1.25 MG (50000 UNIT) PO CAPS: 50000.0000 [IU] | ORAL_CAPSULE | ORAL | 0 refills | Status: DC

## 2018-08-10 NOTE — Progress Notes (Signed)
Office: [phone]  /  Fax: [phone]   HPI:   Chief Complaint: OBESITY Debra [name] is here to discuss her progress with her obesity treatment plan. She is on the Category 2 plan and is following her eating plan approximately 0 % of the time. She states she is exercising 0 minutes 0 times per week. Debra [name] did not follow our plan, but instead just tried to do portion control and ate more microwave meals. She states that she doesn't remember most of her last visit with [place] including discussion and exam, which makes since as she had to be woken up more than once during that visit. She is skipping breakfast and eating large quantities of ice cream at night.  Her weight is 183 lb (83 kg) today and has had a weight loss of 2 pounds over a period of 2 weeks since her last visit. She has lost 2 lbs since starting treatment with [place].  Vitamin D deficiency Debra [name] has a diagnosis of vitamin D deficiency. She is not currently taking vit D.  Diabetes II Debra [name] has a diagnosis of diabetes type II. Her A1c was 6.6 on 07/22/18. She states that she eats ice cream daily, sometimes 1/2 gallon or more per day. She admits polyphagia.  At risk for cardiovascular disease Debra [name] is at a higher than average risk for cardiovascular disease due to diabetes and obesity.   Binge-Eating Disorder Debra [name] eats abnormally large amounts of ice cream (a 1/2 gallon or more) most nights (6+ times per week) for years. She feels addicted and cannot imagine life without ice cream. She has tried lower calorie, portion controlled substitutes, but states that this is expensive, especially since she eats all 4 servings at a time and she feels deprived.  ALLERGIES: Allergies  Allergen Reactions  . Penicillins   . Codeine Rash and Other (See Comments)    dizziness    MEDICATIONS: Current Outpatient Medications on File Prior to Visit  Medication Sig Dispense Refill  . atomoxetine (STRATTERA) 100 MG capsule Take 1 capsule (100 mg  total) by mouth daily. 90 capsule 0  . azelastine (ASTELIN) 0.1 % nasal spray Place 2 sprays into both nostrils 2 (two) times daily. Use in each nostril as directed 30 mL 2  . ferrous sulfate (SLOW FE) 160 (50 Fe) MG TBCR SR tablet Take 1 tablet (160 mg total) by mouth daily. 90 each 3  . gabapentin (NEURONTIN) 600 MG tablet Take 1 tablet (600 mg total) by mouth 3 (three) times daily. 270 tablet 3  . hydrochlorothiazide (HYDRODIURIL) 25 MG tablet 1/2 po qd 45 tablet 3  . levothyroxine (SYNTHROID, LEVOTHROID) 50 MCG tablet Take 1 tablet (50 mcg total) by mouth daily. 90 tablet 3  . losartan (COZAAR) 100 MG tablet Take 1 tablet (100 mg total) by mouth daily. 90 tablet 3  . metoprolol succinate (TOPROL-XL) 50 MG 24 hr tablet Take 50 mg by mouth 2 (two) times daily.  1  . montelukast (SINGULAIR) 10 MG tablet Take 1 tablet (10 mg total) by mouth at bedtime. 30 tablet 11  . NON FORMULARY [facility]  Scar Cream -  Verapamil 10%, Pentoxifylline 5% Apply 1-2 grams to affected area 3-4 times daily Qty. 120 gm 3 refills    . Omega-3 Fatty Acids (FISH OIL PO) Take 1 tablet by mouth as needed. Reported on 03/12/2016    . PARoxetine (PAXIL) 30 MG tablet Take 1 tablet (30 mg total) by mouth daily. 90 tablet 1  . pravastatin (PRAVACHOL)  20 MG tablet Take 1 tablet (20 mg total) by mouth every evening. 90 tablet 1  . predniSONE (DELTASONE) 10 MG tablet TAKE 3 TABLETS PO QD FOR 3 DAYS THEN TAKE 2 TABLETS PO QD FOR 3 DAYS THEN TAKE 1 TABLET PO QD FOR 3 DAYS THEN TAKE 1/2 TAB PO QD FOR 3 DAYS 20 tablet 0  . ranitidine (ZANTAC) 150 MG tablet Take 1 tablet (150 mg total) by mouth 2 (two) times daily. 60 tablet 0  . varenicline (CHANTIX CONTINUING MONTH PAK) 1 MG tablet Take 1 tablet (1 mg total) by mouth 2 (two) times daily. 60 tablet 2  . varenicline (CHANTIX STARTING MONTH PAK) 0.5 MG X 11 & 1 MG X 42 tablet Take one 0.5 mg tablet by mouth once daily for 3 days, then increase to one 0.5 mg tablet twice daily  for 4 days, then increase to one 1 mg tablet twice daily. 53 tablet 0   No current facility-administered medications on file prior to visit.     PAST MEDICAL HISTORY: Past Medical History:  Diagnosis Date  . Anemia   . Anxiety   . Back pain   . Chest pain   . Constipation   . DDD (degenerative disc disease)   . Depression   . Diabetes ([place])   . Epilepsy ([place])   . GERD (gastroesophageal reflux disease)   . HLD (hyperlipidemia)   . HTN (hypertension)   . Joint pain   . Leg edema   . 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 Osteoarthritis   . Sleep apnea   . Stomach ulcer   . Swallowing difficulty   . Thyroid disease    Hypothyroidism  . Vitamin D deficiency     PAST SURGICAL HISTORY: Past Surgical History:  Procedure Laterality Date  . CARPAL TUNNEL RELEASE     Bilateral  . CERVICAL SPINE SURGERY     x3  . LEG SURGERY     Right   . WRIST FRACTURE SURGERY  10-02-15   Pt have surgery twice on the same wrist.    SOCIAL HISTORY: Social History   Tobacco Use  . Smoking status: Current Every Day Smoker    Packs/day: 0.75    Years: 44.00    Pack years: 33.00    Types: Cigarettes  . Smokeless tobacco: Never Used  . Tobacco comment: Started Chantix 08/24/2017  Substance Use Topics  . Alcohol use: Yes    Alcohol/week: 0.0 standard drinks    Comment: 2-3 drinks per week (beer/wine)  . Drug use: No    FAMILY HISTORY: Family History  Problem Relation Age of Onset  . Breast cancer Mother        possibly 57's  . Arthritis Mother        rheumatoid  . Cancer Mother        breast  . Heart disease Mother 78       MI  . Hyperlipidemia Mother   . Thyroid disease Mother   . Depression Mother   . Anxiety disorder Mother   . Heart disease Father        MI  . Hypertension Father   . Stroke Father   . Diabetes Sister   . Hypertension Sister   . Hyperlipidemia Sister   . Colon cancer Maternal Grandfather     ROS: Review of Systems    Constitutional: Positive for weight loss.  Endo/Heme/Allergies:       Positive for  polyphagia.    PHYSICAL EXAM: Blood pressure 116/74, pulse 80, temperature 97.8 F (36.6 C), temperature source Oral, height 5\' 3"  (1.6 m), weight 183 lb (83 kg), SpO2 99 %. Body mass index is 32.42 kg/m. Physical Exam  Constitutional: She is oriented to person, place, and time. She appears well-developed and well-nourished.  Cardiovascular: Normal rate.  Pulmonary/Chest: Effort normal.  Musculoskeletal: Normal range of motion.  Neurological: She is oriented to person, place, and time.  Skin: Skin is warm and dry.  Psychiatric: She has a normal mood and affect. Her behavior is normal.  Vitals reviewed.   RECENT LABS AND TESTS: BMET    Component Value Date/Time   NA 140 07/22/2018 1057   K 5.1 07/22/2018 1057   CL 102 07/22/2018 1057   CO2 24 07/22/2018 1057   GLUCOSE 108 (H) 07/22/2018 1057   GLUCOSE 101 (H) 01/30/2018 1231   GLUCOSE 90 10/20/2006 1125   BUN 12 07/22/2018 1057   CREATININE 0.73 07/22/2018 1057   CREATININE 0.80 07/10/2012 1715   CALCIUM 10.0 07/22/2018 1057   GFRNONAA 89 07/22/2018 1057   GFRAA 102 07/22/2018 1057   Lab Results  Component Value Date   HGBA1C 6.6 (H) 07/22/2018   HGBA1C 6.3 03/12/2016   HGBA1C 6.4 11/07/2014   HGBA1C 6.2 06/29/2013   HGBA1C 6.1 01/14/2013   Lab Results  Component Value Date   INSULIN 14.2 07/22/2018   CBC    Component Value Date/Time   WBC 6.6 07/22/2018 1057   WBC 5.3 01/30/2018 1231   RBC 4.25 07/22/2018 1057   RBC 4.36 01/30/2018 1231   HGB 11.5 07/22/2018 1057   HCT 35.3 07/22/2018 1057   PLT 282.0 01/30/2018 1231   MCV 83 07/22/2018 1057   MCH 27.1 07/22/2018 1057   MCH 27.9 08/31/2014 1720   MCHC 32.6 07/22/2018 1057   MCHC 33.7 01/30/2018 1231   RDW 12.5 07/22/2018 1057   LYMPHSABS 2.4 07/22/2018 1057   MONOABS 0.4 01/30/2018 1231   EOSABS 0.1 07/22/2018 1057   BASOSABS 0.0 07/22/2018 1057    Iron/TIBC/Ferritin/ %Sat    Component Value Date/Time   IRON 67 07/22/2018 1057   TIBC 276 07/22/2018 1057   FERRITIN 257 (H) 07/22/2018 1057   IRONPCTSAT 24 07/22/2018 1057   IRONPCTSAT 24 02/21/2015 1225   Lipid Panel     Component Value Date/Time   CHOL 206 (H) 07/22/2018 1057   TRIG 174 (H) 07/22/2018 1057   HDL 48 07/22/2018 1057   CHOLHDL 3 01/30/2018 1231   VLDL 16.6 01/30/2018 1231   LDLCALC 123 (H) 07/22/2018 1057   LDLDIRECT 126.3 06/06/2011 1533   Hepatic Function Panel     Component Value Date/Time   PROT 7.4 07/22/2018 1057   ALBUMIN 4.3 07/22/2018 1057   AST 22 07/22/2018 1057   ALT 17 07/22/2018 1057   ALKPHOS 104 07/22/2018 1057   BILITOT 0.4 07/22/2018 1057   BILIDIR 0.1 02/21/2015 1417   IBILI 0.3 07/10/2012 1715      Component Value Date/Time   TSH 2.090 07/22/2018 1057   TSH 0.81 01/30/2018 1231   TSH 1.44 08/12/2017 1130   Results for ANESHIA, JACQUET (MRN [id number]) as of 08/10/2018 09:18  Ref. Range 07/22/2018 10:57  Vitamin D, 25-Hydroxy Latest Ref Range: 30.0 - 100.0 ng/mL 26.2 (L)   ASSESSMENT AND PLAN: Vitamin D deficiency - Plan: Vitamin D, Ergocalciferol, (DRISDOL) 50000 units CAPS capsule  Type 2 diabetes mellitus without complication, without long-term current use of insulin (HCC) -  Plan: metFORMIN (GLUCOPHAGE) 500 MG tablet, Blood Glucose Monitoring Suppl (ACCU-CHEK AVIVA) device  Binge eating disorder  At risk for heart disease  Class 1 obesity with serious comorbidity and body mass index (BMI) of 32.0 to 32.9 in adult, unspecified obesity type  PLAN:  Vitamin D Deficiency Debra [name] was informed that low vitamin D levels contributes to fatigue and are associated with obesity, breast, and colon cancer. She agrees to start to take prescription Vit D @50 ,000 IU #4 with every week with no refills and will follow up for routine testing of vitamin D, at least 2-3 times per year. She was informed of the risk of over-replacement of  vitamin D and agrees to not increase her dose unless she discusses this with [place] first. She agrees to follow up in 2 weeks.  Diabetes II Debra [name] has been given extensive diabetes education by myself today including ideal fasting and post-prandial blood glucose readings, individual ideal Hgb A1c goals and hypoglycemia prevention. We discussed the importance of good blood sugar control to decrease the likelihood of diabetic complications such as nephropathy, neuropathy, limb loss, blindness, coronary artery disease, and death. We discussed the importance of intensive lifestyle modification including diet, exercise and weight loss as the first line treatment for diabetes. Samyiah agrees to start metformin 500mg  qAM with food #30 with no refills and to start checking blood sugar with a meter, strips, and lancets # 100 with no refills. She agrees to follow up at the agreed upon time in 2 weeks.  Cardiovascular risk counseling Debra [name] was given extended (30 minutes) coronary artery disease prevention counseling today. She is 62 y.o. female and has risk factors for heart disease including diabetes and obesity. We discussed intensive lifestyle modifications today with an emphasis on specific weight loss instructions and strategies. Pt was also informed of the importance of increasing exercise and decreasing saturated fats to help prevent heart disease.  Binge-Eating Disorder Debra [name] was referred to Dr. Mallie Mussel, our bariatric psychologist for evaluation and treatment of significant struggles with emotional eating.  Obesity Debra [name] is currently in the action stage of change. As such, her goal is to continue with weight loss efforts. She has agreed to follow the Category 1 plan + 100 calories. Debra [name] has been instructed to work up to a goal of 150 minutes of combined cardio and strengthening exercise per week for weight loss and overall health benefits. We discussed the following Behavioral Modification Strategies  today: increasing lean protein intake, decreasing simple carbohydrates, increasing vegetables, no skipping meals, work on meal planning and easy cooking plans, emotional eating strategies, and ways to avoid night time snacking.  Debra [name] has agreed to follow up with our clinic in 2 weeks for and office visit and an appointment with Dr. Mallie Mussel. She was informed of the importance of frequent follow up visits to maximize her success with intensive lifestyle modifications for her multiple health conditions.   OBESITY BEHAVIORAL INTERVENTION VISIT  Today's visit was # 2   Starting weight: 185 lbs Starting date: 07/22/18 Today's weight : Weight: 183 lb (83 kg)  Today's date: 08/05/2018 Total lbs lost to date: 2  ASK: We discussed the diagnosis of obesity with Debra [name] today and [facility] agreed to give [place] permission to discuss obesity behavioral modification therapy today.  ASSESS: Sarinity has the diagnosis of obesity and her BMI today is 32.42.  Kaizley is in the action stage of change.   ADVISE: Makaylah was educated on the multiple health risks of obesity as well  as the benefit of weight loss to improve her health. She was advised of the need for long term treatment and the importance of lifestyle modifications to improve her current health and to decrease her risk of future health problems.  AGREE: Multiple dietary modification options and treatment options were discussed and Veda agreed to follow the recommendations documented in the above note.  ARRANGE: Adreana was educated on the importance of frequent visits to treat obesity as outlined per CMS and USPSTF guidelines and agreed to schedule her next follow up appointment today.  Lenward Chancellor, am acting as Location manager for Debra Skeans, MD

## 2018-08-19 ENCOUNTER — Ambulatory Visit (INDEPENDENT_AMBULATORY_CARE_PROVIDER_SITE_OTHER): Payer: BLUE CROSS/BLUE SHIELD | Admitting: Psychology

## 2018-08-19 ENCOUNTER — Ambulatory Visit (INDEPENDENT_AMBULATORY_CARE_PROVIDER_SITE_OTHER): Payer: BLUE CROSS/BLUE SHIELD | Admitting: Family Medicine

## 2018-08-19 VITALS — BP 106/74 | HR 67 | Temp 98.0°F | Ht 63.0 in | Wt 183.0 lb

## 2018-08-19 DIAGNOSIS — Z9189 Other specified personal risk factors, not elsewhere classified: Secondary | ICD-10-CM

## 2018-08-19 DIAGNOSIS — E559 Vitamin D deficiency, unspecified: Secondary | ICD-10-CM | POA: Diagnosis not present

## 2018-08-19 DIAGNOSIS — R0789 Other chest pain: Secondary | ICD-10-CM | POA: Diagnosis not present

## 2018-08-19 DIAGNOSIS — F331 Major depressive disorder, recurrent, moderate: Secondary | ICD-10-CM

## 2018-08-19 DIAGNOSIS — R413 Other amnesia: Secondary | ICD-10-CM

## 2018-08-19 DIAGNOSIS — E119 Type 2 diabetes mellitus without complications: Secondary | ICD-10-CM | POA: Diagnosis not present

## 2018-08-19 DIAGNOSIS — Z6832 Body mass index (BMI) 32.0-32.9, adult: Secondary | ICD-10-CM

## 2018-08-19 DIAGNOSIS — E669 Obesity, unspecified: Secondary | ICD-10-CM

## 2018-08-19 MED ORDER — VITAMIN D (ERGOCALCIFEROL) 1.25 MG (50000 UNIT) PO CAPS: 50000.0000 [IU] | ORAL_CAPSULE | ORAL | 0 refills | Status: DC

## 2018-08-19 MED ORDER — METFORMIN HCL 500 MG PO TABS: 500.0000 mg | ORAL_TABLET | Freq: Every day | ORAL | 0 refills | Status: DC
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
## 2018-08-24 NOTE — Progress Notes (Signed)
Office: [phone]  /  Fax: [phone]   HPI:   Chief Complaint: OBESITY Debra [name] is here to discuss her progress with her obesity treatment plan. She is following Category 1+ 100 plan and is following her eating plan approximately 75 % of the time. She states she is exercising 0 minutes 0 times per week. Debra [name] has not been following her plan and was not even sure what the name of her plan was. She states that she has been doing some portion control and making smarter choices and has maintained her diet. Debra [name] said that she is too tired to cook.  Her weight is 183 lb (83 kg) today and has not lost weight since her last visit. She has lost 2 lbs since starting treatment with [place].  Chest Pain: Debra [name] complains of chest pain. She states that she is having "jolting" chest pain that has been intermittent for the past week. She has not sought treatment and denies dyspnea but does complain of nausea. Debra [name] has had multiple episodes already today.  EKG is within normal limits. No ST elevation and no T-wave inversion.  We discussed risk factors to watch for and when to seek immediate treatment.   Memory Loss Debra [name] was seen by our psychologist earlier today. She cannot remember her first visit with [place] at all. She said she feels tired all the time. Debra [name] has tried to user her CPAP but is not using it consistently so this is uncontrolled.   Diabetes II Debra [name] has a diagnosis of diabetes type II. Debra [name]  denies any hypoglycemic episodes. Last A1c was 6.6%. Debra [name] is not compliant with Metformin as she cannot remember to take it with her food. She is trying to ear healthier but not following her plan closely.  Hemoglobin A1C Latest Ref Rng & Units 07/22/2018  HGBA1C 4.8 - 5.6 % 6.6(H)  Some recent data might be hidden    She has been working on intensive lifestyle modifications including diet, exercise, and weight loss to help control her blood glucose levels.  Vitamin D deficiency Debra [name] has a  diagnosis of vitamin D deficiency. She is stable taking Prescription Vit D 50000 iu weekly but not yet at goal. Debra [name] denies nausea, vomiting or muscle weakness.   ALLERGIES: Allergies  Allergen Reactions  . Penicillins   . Codeine Rash and Other (See Comments)    dizziness    MEDICATIONS: Current Outpatient Medications on File Prior to Visit  Medication Sig Dispense Refill  . atomoxetine (STRATTERA) 100 MG capsule Take 1 capsule (100 mg total) by mouth daily. 90 capsule 0  . azelastine (ASTELIN) 0.1 % nasal spray Place 2 sprays into both nostrils 2 (two) times daily. Use in each nostril as directed 30 mL 2  . Blood Glucose Monitoring Suppl (ACCU-CHEK AVIVA) device Check twice daily, Also give Accu-chek strips #100 and accu chek lancets #100 1 each 0  . ferrous sulfate (SLOW FE) 160 (50 Fe) MG TBCR SR tablet Take 1 tablet (160 mg total) by mouth daily. 90 each 3  . gabapentin (NEURONTIN) 600 MG tablet Take 1 tablet (600 mg total) by mouth 3 (three) times daily. 270 tablet 3  . hydrochlorothiazide (HYDRODIURIL) 25 MG tablet 1/2 po qd 45 tablet 3  . levothyroxine (SYNTHROID, LEVOTHROID) 50 MCG tablet Take 1 tablet (50 mcg total) by mouth daily. 90 tablet 3  . losartan (COZAAR) 100 MG tablet Take 1 tablet (100 mg total) by mouth daily. 90 tablet 3  . metoprolol succinate (TOPROL-XL) 50  MG 24 hr tablet Take 50 mg by mouth 2 (two) times daily.  1  . montelukast (SINGULAIR) 10 MG tablet Take 1 tablet (10 mg total) by mouth at bedtime. 30 tablet 11  . NON FORMULARY [facility]  Scar Cream -  Verapamil 10%, Pentoxifylline 5% Apply 1-2 grams to affected area 3-4 times daily Qty. 120 gm 3 refills    . Omega-3 Fatty Acids (FISH OIL PO) Take 1 tablet by mouth as needed. Reported on 03/12/2016    . PARoxetine (PAXIL) 30 MG tablet Take 1 tablet (30 mg total) by mouth daily. 90 tablet 1  . pravastatin (PRAVACHOL) 20 MG tablet Take 1 tablet (20 mg total) by mouth every evening. 90 tablet 1  .  predniSONE (DELTASONE) 10 MG tablet TAKE 3 TABLETS PO QD FOR 3 DAYS THEN TAKE 2 TABLETS PO QD FOR 3 DAYS THEN TAKE 1 TABLET PO QD FOR 3 DAYS THEN TAKE 1/2 TAB PO QD FOR 3 DAYS 20 tablet 0  . ranitidine (ZANTAC) 150 MG tablet Take 1 tablet (150 mg total) by mouth 2 (two) times daily. 60 tablet 0  . varenicline (CHANTIX CONTINUING MONTH PAK) 1 MG tablet Take 1 tablet (1 mg total) by mouth 2 (two) times daily. 60 tablet 2  . varenicline (CHANTIX STARTING MONTH PAK) 0.5 MG X 11 & 1 MG X 42 tablet Take one 0.5 mg tablet by mouth once daily for 3 days, then increase to one 0.5 mg tablet twice daily for 4 days, then increase to one 1 mg tablet twice daily. 53 tablet 0   No current facility-administered medications on file prior to visit.     PAST MEDICAL HISTORY: Past Medical History:  Diagnosis Date  . Anemia   . Anxiety   . Back pain   . Chest pain   . Constipation   . DDD (degenerative disc disease)   . Depression   . Diabetes ([place])   . Epilepsy ([place])   . GERD (gastroesophageal reflux disease)   . HLD (hyperlipidemia)   . HTN (hypertension)   . Joint pain   . Leg edema   . 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 Osteoarthritis   . Sleep apnea   . Stomach ulcer   . Swallowing difficulty   . Thyroid disease    Hypothyroidism  . Vitamin D deficiency     PAST SURGICAL HISTORY: Past Surgical History:  Procedure Laterality Date  . CARPAL TUNNEL RELEASE     Bilateral  . CERVICAL SPINE SURGERY     x3  . LEG SURGERY     Right   . WRIST FRACTURE SURGERY  10-02-15   Pt have surgery twice on the same wrist.    SOCIAL HISTORY: Social History   Tobacco Use  . Smoking status: Current Every Day Smoker    Packs/day: 0.75    Years: 44.00    Pack years: 33.00    Types: Cigarettes  . Smokeless tobacco: Never Used  . Tobacco comment: Started Chantix 08/24/2017  Substance Use Topics  . Alcohol use: Yes    Alcohol/week: 0.0 standard drinks    Comment: 2-3 drinks  per week (beer/wine)  . Drug use: No    FAMILY HISTORY: Family History  Problem Relation Age of Onset  . Breast cancer Mother        possibly 43's  . Arthritis Mother        rheumatoid  . Cancer Mother  breast  . Heart disease Mother 60       MI  . Hyperlipidemia Mother   . Thyroid disease Mother   . Depression Mother   . Anxiety disorder Mother   . Heart disease Father        MI  . Hypertension Father   . Stroke Father   . Diabetes Sister   . Hypertension Sister   . Hyperlipidemia Sister   . Colon cancer Maternal Grandfather     ROS: Review of Systems  Constitutional: Positive for malaise/fatigue. Negative for weight loss.  Respiratory: Negative for shortness of breath.   Cardiovascular: Positive for chest pain.       Negative for ST elevation Negative for T-wave inversion  Gastrointestinal: Positive for nausea. Negative for vomiting.  Musculoskeletal:       Negative for muscle weakness  Neurological:       Positive for memory loss  Psychiatric/Behavioral:       Positive for anxiety    PHYSICAL EXAM: Blood pressure 106/74, pulse 67, temperature 98 F (36.7 C), temperature source Oral, height 5\' 3"  (1.6 m), weight 183 lb (83 kg), SpO2 99 %. Body mass index is 32.42 kg/m. Physical Exam  RECENT LABS AND TESTS: BMET    Component Value Date/Time   NA 140 07/22/2018 1057   K 5.1 07/22/2018 1057   CL 102 07/22/2018 1057   CO2 24 07/22/2018 1057   GLUCOSE 108 (H) 07/22/2018 1057   GLUCOSE 101 (H) 01/30/2018 1231   GLUCOSE 90 10/20/2006 1125   BUN 12 07/22/2018 1057   CREATININE 0.73 07/22/2018 1057   CREATININE 0.80 07/10/2012 1715   CALCIUM 10.0 07/22/2018 1057   GFRNONAA 89 07/22/2018 1057   GFRAA 102 07/22/2018 1057   Lab Results  Component Value Date   HGBA1C 6.6 (H) 07/22/2018   HGBA1C 6.3 03/12/2016   HGBA1C 6.4 11/07/2014   HGBA1C 6.2 06/29/2013   HGBA1C 6.1 01/14/2013   Lab Results  Component Value Date   INSULIN 14.2 07/22/2018     CBC    Component Value Date/Time   WBC 6.6 07/22/2018 1057   WBC 5.3 01/30/2018 1231   RBC 4.25 07/22/2018 1057   RBC 4.36 01/30/2018 1231   HGB 11.5 07/22/2018 1057   HCT 35.3 07/22/2018 1057   PLT 282.0 01/30/2018 1231   MCV 83 07/22/2018 1057   MCH 27.1 07/22/2018 1057   MCH 27.9 08/31/2014 1720   MCHC 32.6 07/22/2018 1057   MCHC 33.7 01/30/2018 1231   RDW 12.5 07/22/2018 1057   LYMPHSABS 2.4 07/22/2018 1057   MONOABS 0.4 01/30/2018 1231   EOSABS 0.1 07/22/2018 1057   BASOSABS 0.0 07/22/2018 1057   Iron/TIBC/Ferritin/ %Sat    Component Value Date/Time   IRON 67 07/22/2018 1057   TIBC 276 07/22/2018 1057   FERRITIN 257 (H) 07/22/2018 1057   IRONPCTSAT 24 07/22/2018 1057   IRONPCTSAT 24 02/21/2015 1225   Lipid Panel     Component Value Date/Time   CHOL 206 (H) 07/22/2018 1057   TRIG 174 (H) 07/22/2018 1057   HDL 48 07/22/2018 1057   CHOLHDL 3 01/30/2018 1231   VLDL 16.6 01/30/2018 1231   LDLCALC 123 (H) 07/22/2018 1057   LDLDIRECT 126.3 06/06/2011 1533   Hepatic Function Panel     Component Value Date/Time   PROT 7.4 07/22/2018 1057   ALBUMIN 4.3 07/22/2018 1057   AST 22 07/22/2018 1057   ALT 17 07/22/2018 1057   ALKPHOS 104 07/22/2018 1057  BILITOT 0.4 07/22/2018 1057   BILIDIR 0.1 02/21/2015 1417   IBILI 0.3 07/10/2012 1715      Component Value Date/Time   TSH 2.090 07/22/2018 1057   TSH 0.81 01/30/2018 1231   TSH 1.44 08/12/2017 1130   Results for AZALYA, GALYON (MRN [id number]) as of 08/24/2018 14:45  Ref. Range 07/22/2018 10:57  Vitamin D, 25-Hydroxy Latest Ref Range: 30.0 - 100.0 ng/mL 26.2 (L)   ASSESSMENT AND PLAN: Other chest pain - Plan: EKG 12-Lead  Memory loss  Vitamin D deficiency - Plan: Vitamin D, Ergocalciferol, (DRISDOL) 50000 units CAPS capsule  Type 2 diabetes mellitus without complication, without long-term current use of insulin (HCC) - Plan: metFORMIN (GLUCOPHAGE) 500 MG tablet  At risk for heart disease  Class 1  obesity with serious comorbidity and body mass index (BMI) of 32.0 to 32.9 in adult, unspecified obesity type  PLAN: Chest pain Debra [name]'s chest pain is unlikely to be cardiac in nature. Her symptoms appear to be anxiety related. Debra [name] has agreed to follow up with her PCP to discuss further treatment. Debra [name] has agreed to follow up in our office in 2-3 weeks.   Memory loss Debra [name] has agreed to continue seeing Dr Mallie Mussel as this may be related to depression and anxiety. If there is no improvement, we will refer to neurology for additional testing and evaluation. Debra [name] has agreed to follow up in our office in 2-3 weeks.   Diabetes II Debra [name] has been given extensive diabetes education by myself today including ideal fasting and post-prandial blood glucose readings, individual ideal HgA1c goals  and hypoglycemia prevention. We discussed the importance of good blood sugar control to decrease the likelihood of diabetic complications such as nephropathy, neuropathy, limb loss, blindness, coronary artery disease, and death. We discussed the importance of intensive lifestyle modification including diet, exercise and weight loss as the first line treatment for diabetes. Debra [name] agrees to continue taking Metformin 500 mg qd #30 with no refills. She was given a pill box to help her remember to take regularly. Debra [name] will follow up with our office in 2-3 weeks.   Vitamin D Deficiency Debra [name] was informed that low vitamin D levels contributes to fatigue and are associated with obesity, breast, and colon cancer. She agrees to continue taking prescription Vit D @50 ,000 IU every week #4 with no refills. Debra [name] will follow up for routine testing of vitamin D, at least 2-3 times per year. She was informed of the risk of over-replacement of vitamin D and agrees to not increase her dose unless she discusses this with [place] first. Debra [name] agrees to follow up with our office in 2-3 weeks.   Obesity Debra [name] is not currently in the  action stage of change. As such, her goal is to continue with weight loss efforts She has agreed to portion control better and make smarter food choices, such as increase vegetables and decrease simple carbohydrates  Debra [name] has been instructed to work up to a goal of 150 minutes of combined cardio and strengthening exercise per week for weight loss and overall health benefits. We discussed the following Behavioral Modification Strategies today: increasing lean protein intake and decreasing simple carbohydrates   Debra [name] has agreed to follow up with our clinic in 2-3 weeks. She was informed of the importance of frequent follow up visits to maximize her success with intensive lifestyle modifications for her multiple health conditions.   OBESITY BEHAVIORAL INTERVENTION VISIT  Today's visit was # 3   Starting weight: 185 lbs  Starting date: 07/22/2018 Today's weight : Weight: 183 lb (83 kg)  Today's date: 08/19/2018 Total lbs lost to date: 2 lbs   ASK: We discussed the diagnosis of obesity with Debra [name] today and Debra [name] agreed to give [place] permission to discuss obesity behavioral modification therapy today.  ASSESS: Quetzaly has the diagnosis of obesity and her BMI today is 32.42 Denyse is not in the action stage of change   ADVISE: Tarin was educated on the multiple health risks of obesity as well as the benefit of weight loss to improve her health. She was advised of the need for long term treatment and the importance of lifestyle modifications to improve her current health and to decrease her risk of future health problems.  AGREE: Multiple dietary modification options and treatment options were discussed and  Citlalli agreed to follow the recommendations documented in the above note.  ARRANGE: Khadija was educated on the importance of frequent visits to treat obesity as outlined per CMS and USPSTF guidelines and agreed to schedule her next follow up appointment today.  Felipa Emory, CMA  am acting as transcriptionist for Dennard Nip, MD  I have reviewed the above documentation for accuracy and completeness, and I agree with the above. -Dennard Nip, MD

## 2018-08-25 ENCOUNTER — Encounter (INDEPENDENT_AMBULATORY_CARE_PROVIDER_SITE_OTHER): Payer: Self-pay | Admitting: Family Medicine

## 2018-08-26 ENCOUNTER — Ambulatory Visit
Admission: RE | Admit: 2018-08-26 | Discharge: 2018-08-26 | Disposition: A | Discharge status: Home / Self Care | Payer: BLUE CROSS/BLUE SHIELD | Source: Ambulatory Visit | Attending: Acute Care | Admitting: Acute Care

## 2018-08-26 DIAGNOSIS — Z122 Encounter for screening for malignant neoplasm of respiratory organs: Secondary | ICD-10-CM

## 2018-08-26 DIAGNOSIS — F17210 Nicotine dependence, cigarettes, uncomplicated: Secondary | ICD-10-CM | POA: Diagnosis not present

## 2018-08-31 ENCOUNTER — Other Ambulatory Visit: Payer: Self-pay | Admitting: Acute Care

## 2018-08-31 DIAGNOSIS — F17210 Nicotine dependence, cigarettes, uncomplicated: Secondary | ICD-10-CM

## 2018-08-31 DIAGNOSIS — Z122 Encounter for screening for malignant neoplasm of respiratory organs: Secondary | ICD-10-CM

## 2018-09-03 ENCOUNTER — Other Ambulatory Visit: Payer: Self-pay | Admitting: Family Medicine

## 2018-09-03 DIAGNOSIS — R41840 Attention and concentration deficit: Secondary | ICD-10-CM

## 2018-09-09 ENCOUNTER — Ambulatory Visit (INDEPENDENT_AMBULATORY_CARE_PROVIDER_SITE_OTHER): Payer: BLUE CROSS/BLUE SHIELD | Admitting: Psychology

## 2018-09-09 ENCOUNTER — Ambulatory Visit (INDEPENDENT_AMBULATORY_CARE_PROVIDER_SITE_OTHER): Payer: BLUE CROSS/BLUE SHIELD | Admitting: Family Medicine

## 2018-09-09 VITALS — BP 136/84 | HR 64 | Ht 63.0 in | Wt 179.0 lb

## 2018-09-09 DIAGNOSIS — E119 Type 2 diabetes mellitus without complications: Secondary | ICD-10-CM | POA: Diagnosis not present

## 2018-09-09 DIAGNOSIS — R413 Other amnesia: Secondary | ICD-10-CM | POA: Diagnosis not present

## 2018-09-09 DIAGNOSIS — E559 Vitamin D deficiency, unspecified: Secondary | ICD-10-CM | POA: Diagnosis not present

## 2018-09-09 DIAGNOSIS — F331 Major depressive disorder, recurrent, moderate: Secondary | ICD-10-CM | POA: Diagnosis not present

## 2018-09-09 DIAGNOSIS — E669 Obesity, unspecified: Secondary | ICD-10-CM

## 2018-09-09 DIAGNOSIS — Z6831 Body mass index (BMI) 31.0-31.9, adult: Secondary | ICD-10-CM

## 2018-09-09 MED ORDER — VITAMIN D (ERGOCALCIFEROL) 1.25 MG (50000 UNIT) PO CAPS: 50000.0000 [IU] | ORAL_CAPSULE | ORAL | 0 refills | Status: DC

## 2018-09-09 MED ORDER — METFORMIN HCL 500 MG PO TABS: 500.0000 mg | ORAL_TABLET | Freq: Every day | ORAL | 0 refills | Status: DC

## 2018-09-09 NOTE — Progress Notes (Signed)
Office: [phone]  /  Fax: [phone]   Date:September 09, 2018  Time Seen: 12:12pm Duration: 30 minutes Provider: Glennie Isle, Psy.D. Type of Session: Individual Therapy   HPI: Melba was referred by Dr. Dennard Nip, and was seen for an initial appointment by this provider on August 19, 2018. Per the note for the visit with Dr. Dennard Nip on August 05, 2018, "Rubyann eats abnormally large amounts of ice cream (a 1/2 gallon or more) most nights (6+ times per week) for years. She feels addicted and cannot imagine life without ice cream. She has tried lower calorie, portion controlled substitutes, but states that this is expensive, especially since she eats all 4 servings at a time and she feels deprived." In addition, per the note for the initial visit with Dr. Dennard Nip on July 22, 2018, Lezley has been heavy most of herlife. She reported experiencing the following: significant food cravings issues; snacking frequently in the evenings; skipping meals frequently; frequently drinking liquids with calories; frequently making poor food choices; binge eating behaviors; and struggling with emotional eating. The note for the initial visit with Dr. Leafy Ro also indicated the following: "Emanii has unrealistic weight loss goals and she struggled to follow instructions, such as, to be fasting and filling out all of her paperwork. Faithe wants to be vegetarian for health and moral reasons. 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 She shared that the night before the initial appointment with this provider, she consumed two ice cream bars that were recommended by this provider's clinic. In addition, she noted she sometimes binges on cookies. Moreover, Yuko noted she has been engaging in binge eating for years. She denied a  history of a formal eating disorder diagnosis. She she also denied a history of purging and engagement in other compensatory strategies. Furthermore, Jacklyne was asked to complete a questionnaire assessing various behaviors related to emotional eating. Elvira endorsed the following: experience food cravings on a regular basis, eat certain foods when you are anxious, stressed, depressed, or your feelings are hurt, use food to help you cope with emotional situations, find food is comforting to you, overeat when you are worried about something, overeat frequently when you are bored or lonely and eat as a reward.  Session Content: Session focused on the following treatment goal: decrease emotional eating. The session was initiated with the administration of the PHQ-9 and GAD-7, as well as a brief check-in. Jamesa reported she lost four pounds since last appointment with this provider; however, she discussed only eating some of the foods from the prescribed meal plan. She added, "I cannot find a lot of the things on it." Seairra's eating habits were further explored and it was identified that she is not consuming enough protein. It was recommended that [facility] set a goal for herself between now and the next appointment with this provider to increase her protein intake. Various ways to meet this goal were discussed, including creating a checklist out of her prescribed meal plan to make sure she is eating everything noted. It is important to note, Dorlis has not consumed any regular ice cream since the last appointment with this provider, instead she continues to eat the options recommended by this provider's clinic. Remainder of today's appointment focused on reviewing physical versus emotional hunger. Today's appointment concluded with [facility] sharing  concern related to gingivitis. More specifically, she shared that on a doctor show she heard that gingivitis can impact brain functioning and is wondering if that is the  case for her as "people tell [her] something is not right." Chelsae shared she was previously diagnosed with gingivitis, but has not seen a dentist in recent years. Thus, it is recommended that [facility] follow up with a dentist. This provider will also consult with Dr. Leafy Ro regarding the aforementioned. Overall, Autym was receptive to today's appointment as evidenced by her openness to sharing, responsiveness to feedback, and willingness to engage in problem solving to increase protein intake and compliance with the prescribed meal plan.  Mental Status Examination: Avyana arrived late for the appointment, as her appointment with Dr. Leafy Ro ran late and then she had to be checked-in for her appointment with this provider. She presented as appropriately dressed and groomed. Lovie appeared her stated age and demonstrated adequate orientation to time, place, person, and purpose of the appointment. She also demonstrated appropriate eye contact. No psychomotor abnormalities or behavioral peculiarities noted. Her mood was euthymic with congruent affect. Her thought processes were logical, linear, and goal-directed. Notably, Davian took longer than what is typical to completed the PHQ-9 and GAD-7. No hallucinations, delusions, bizarre thinking or behavior reported or observed. Judgment, insight, and impulse control appeared to be grossly intact. There was no evidence of paraphasias (i.e., errors in speech, gross mispronunciations, and word substitutions), repetition deficits, or disturbances in volume or prosody (i.e., rhythm and intonation). There was no evidence of attention or memory impairments. Keegan denied current suicidal and homicidal ideation, intent or plan.  Structured Assessment Results: The Patient Health Questionnaire-9 (PHQ-9) is a self-report measure that assesses symptoms and severity of depression over the course of the last two weeks. Lilliemae obtained a score of 12 suggesting moderate depression.  Nyima finds the endorsed symptoms to be somewhat difficult. Depression screen PHQ 2/9 09/09/2018  Decreased Interest 0  Down, Depressed, Hopeless 1  PHQ - 2 Score 1  Altered sleeping 2  Tired, decreased energy 3  Change in appetite 2  Feeling bad or failure about yourself  2  Trouble concentrating 2  Moving slowly or fidgety/restless 0  Suicidal thoughts 0  PHQ-9 Score 12  Difficult doing work/chores -   The Generalized Anxiety Disorder-7 (GAD-7) is a brief self-report measure that assesses symptoms of anxiety over the course of the last two weeks. Twanna obtained a score of seven suggesting mild anxiety. GAD 7 : Generalized Anxiety Score 09/09/2018  Nervous, Anxious, on Edge 2  Control/stop worrying 0  Worry too much - different things 1  Trouble relaxing 2  Restless 1  Easily annoyed or irritable 1  Afraid - awful might happen 0  Total GAD 7 Score 7  Anxiety Difficulty Very difficult   Interventions: Sharen was administered the PHQ-9 and GAD-7 for symptom monitoring. Content from the last session was reviewed. Throughout today's session, empathic reflections and validation were provided. This provider engaged Bruce in problem solving, and helped her establish a goal to increase protein intake. Further psychoeducation regarding physical versus emotional hunger was provided.  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 Madelene has demonstrated progress in her goal of decreasing emotional  eating as evidenced by not consuming regular ice cream and her willingness to explore hunger patterns.  Plan: Achsah continues to appear able and willing to participate as  evidenced by engagement in reciprocal conversation, and asking questions for clarification as appropriate. Due to the Thanksgiving holiday and this provider being out in the coming weeks, the next appointment will be scheduled in two to three weeks. Reise was informed of the aforementioned, and she verbally acknowledged understanding. The next session will focus on reviewing hunger patterns and identifying triggers for emotional eating. Moreover, this provider will consult with Dr. Leafy Ro regarding Joud's concerns related to gingivitis and brain functioning.

## 2018-09-15 NOTE — Progress Notes (Signed)
Office: [phone]  /  Fax: [phone]   HPI:   Chief Complaint: OBESITY Debra [name] is here to discuss her progress with her obesity treatment plan. She is on the portion control better and make smarter food choices, such as increase vegetables and decrease simple carbohydrates and is following her eating plan approximately 0 % of the time. She states she is exercising 0 minutes 0 times per week. Debra [name] continues to lose weight but she is not following our plan. She is still eating a lot of carbohydrates, maybe 75% of her calorie intake. She is eating out a lot but trying to make smarter choices.  Her weight is 179 lb (81.2 kg) today and has had a weight loss of 4 pounds over a period of 3 weeks since her last visit. She has lost 6 lbs since starting treatment with [place].  Diabetes II Debra [name] has a diagnosis of diabetes type II. Debra [name] is stable on metformin, she has been working on intensive lifestyle modifications including diet, exercise, and weight loss to help control her blood glucose levels. She did not bring in BGs log today. Last A1c was 6.6.  Vitamin D Deficiency Debra [name] has a diagnosis of vitamin D deficiency. She is stable on prescription Vit D and denies nausea, vomiting or muscle weakness.  Memory Loss Debra [name] notes worsening short term memory loss for months. She also notes excessive fatigue, but she is not sleeping well. She requests a referral to Neurology.  ALLERGIES: Allergies  Allergen Reactions  . Penicillins   . Codeine Rash and Other (See Comments)    dizziness    MEDICATIONS: Current Outpatient Medications on File Prior to Visit  Medication Sig Dispense Refill  . atomoxetine (STRATTERA) 100 MG capsule TAKE 1 CAPSULE BY MOUTH ONCE DAILY 90 capsule 1  . azelastine (ASTELIN) 0.1 % nasal spray Place 2 sprays into both nostrils 2 (two) times daily. Use in each nostril as directed 30 mL 2  . Blood Glucose Monitoring Suppl (ACCU-CHEK AVIVA) device Check twice daily,  Also give Accu-chek strips #100 and accu chek lancets #100 1 each 0  . ferrous sulfate (SLOW FE) 160 (50 Fe) MG TBCR SR tablet Take 1 tablet (160 mg total) by mouth daily. 90 each 3  . gabapentin (NEURONTIN) 600 MG tablet Take 1 tablet (600 mg total) by mouth 3 (three) times daily. 270 tablet 3  . hydrochlorothiazide (HYDRODIURIL) 25 MG tablet 1/2 po qd 45 tablet 3  . levothyroxine (SYNTHROID, LEVOTHROID) 50 MCG tablet Take 1 tablet (50 mcg total) by mouth daily. 90 tablet 3  . losartan (COZAAR) 100 MG tablet Take 1 tablet (100 mg total) by mouth daily. 90 tablet 3  . metoprolol succinate (TOPROL-XL) 50 MG 24 hr tablet Take 50 mg by mouth 2 (two) times daily.  1  . montelukast (SINGULAIR) 10 MG tablet Take 1 tablet (10 mg total) by mouth at bedtime. 30 tablet 11  . NON FORMULARY [facility]  Scar Cream -  Verapamil 10%, Pentoxifylline 5% Apply 1-2 grams to affected area 3-4 times daily Qty. 120 gm 3 refills    . Omega-3 Fatty Acids (FISH OIL PO) Take 1 tablet by mouth as needed. Reported on 03/12/2016    . PARoxetine (PAXIL) 30 MG tablet Take 1 tablet (30 mg total) by mouth daily. 90 tablet 1  . pravastatin (PRAVACHOL) 20 MG tablet Take 1 tablet (20 mg total) by mouth every evening. 90 tablet 1  . ranitidine (ZANTAC) 150 MG tablet Take 1 tablet (  150 mg total) by mouth 2 (two) times daily. 60 tablet 0  . varenicline (CHANTIX CONTINUING MONTH PAK) 1 MG tablet Take 1 tablet (1 mg total) by mouth 2 (two) times daily. 60 tablet 2  . varenicline (CHANTIX STARTING MONTH PAK) 0.5 MG X 11 & 1 MG X 42 tablet Take one 0.5 mg tablet by mouth once daily for 3 days, then increase to one 0.5 mg tablet twice daily for 4 days, then increase to one 1 mg tablet twice daily. 53 tablet 0   No current facility-administered medications on file prior to visit.     PAST MEDICAL HISTORY: Past Medical History:  Diagnosis Date  . Anemia   . Anxiety   . Back pain   . Chest pain   . Constipation   . DDD  (degenerative disc disease)   . Depression   . Diabetes ([place])   . Epilepsy ([place])   . GERD (gastroesophageal reflux disease)   . HLD (hyperlipidemia)   . HTN (hypertension)   . Joint pain   . Leg edema   . 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 Osteoarthritis   . Sleep apnea   . Stomach ulcer   . Swallowing difficulty   . Thyroid disease    Hypothyroidism  . Vitamin D deficiency     PAST SURGICAL HISTORY: Past Surgical History:  Procedure Laterality Date  . CARPAL TUNNEL RELEASE     Bilateral  . CERVICAL SPINE SURGERY     x3  . LEG SURGERY     Right   . WRIST FRACTURE SURGERY  10-02-15   Pt have surgery twice on the same wrist.    SOCIAL HISTORY: Social History   Tobacco Use  . Smoking status: Current Every Day Smoker    Packs/day: 0.75    Years: 44.00    Pack years: 33.00    Types: Cigarettes  . Smokeless tobacco: Never Used  . Tobacco comment: Started Chantix 08/24/2017  Substance Use Topics  . Alcohol use: Yes    Alcohol/week: 0.0 standard drinks    Comment: 2-3 drinks per week (beer/wine)  . Drug use: No    FAMILY HISTORY: Family History  Problem Relation Age of Onset  . Breast cancer Mother        possibly 32's  . Arthritis Mother        rheumatoid  . Cancer Mother        breast  . Heart disease Mother 45       MI  . Hyperlipidemia Mother   . Thyroid disease Mother   . Depression Mother   . Anxiety disorder Mother   . Heart disease Father        MI  . Hypertension Father   . Stroke Father   . Diabetes Sister   . Hypertension Sister   . Hyperlipidemia Sister   . Colon cancer Maternal Grandfather     ROS: Review of Systems  Constitutional: Positive for weight loss.  Gastrointestinal: Negative for nausea and vomiting.  Musculoskeletal:       Negative muscle weakness  Endo/Heme/Allergies:       Negative hypoglycemia    PHYSICAL EXAM: Blood pressure 136/84, pulse 64, height 5\' 3"  (1.6 m), weight 179 lb (81.2 kg),  SpO2 100 %. Body mass index is 31.71 kg/m. Physical Exam  Constitutional: She is oriented to person, place, and time. She appears well-developed and well-nourished.  Cardiovascular: Normal rate.  Pulmonary/Chest: Effort normal.  Musculoskeletal: Normal range of motion.  Neurological: She is oriented to person, place, and time.  Skin: Skin is warm and dry.  Psychiatric: She has a normal mood and affect. Her behavior is normal.  Vitals reviewed.   RECENT LABS AND TESTS: BMET    Component Value Date/Time   NA 140 07/22/2018 1057   K 5.1 07/22/2018 1057   CL 102 07/22/2018 1057   CO2 24 07/22/2018 1057   GLUCOSE 108 (H) 07/22/2018 1057   GLUCOSE 101 (H) 01/30/2018 1231   GLUCOSE 90 10/20/2006 1125   BUN 12 07/22/2018 1057   CREATININE 0.73 07/22/2018 1057   CREATININE 0.80 07/10/2012 1715   CALCIUM 10.0 07/22/2018 1057   GFRNONAA 89 07/22/2018 1057   GFRAA 102 07/22/2018 1057   Lab Results  Component Value Date   HGBA1C 6.6 (H) 07/22/2018   HGBA1C 6.3 03/12/2016   HGBA1C 6.4 11/07/2014   HGBA1C 6.2 06/29/2013   HGBA1C 6.1 01/14/2013   Lab Results  Component Value Date   INSULIN 14.2 07/22/2018   CBC    Component Value Date/Time   WBC 6.6 07/22/2018 1057   WBC 5.3 01/30/2018 1231   RBC 4.25 07/22/2018 1057   RBC 4.36 01/30/2018 1231   HGB 11.5 07/22/2018 1057   HCT 35.3 07/22/2018 1057   PLT 282.0 01/30/2018 1231   MCV 83 07/22/2018 1057   MCH 27.1 07/22/2018 1057   MCH 27.9 08/31/2014 1720   MCHC 32.6 07/22/2018 1057   MCHC 33.7 01/30/2018 1231   RDW 12.5 07/22/2018 1057   LYMPHSABS 2.4 07/22/2018 1057   MONOABS 0.4 01/30/2018 1231   EOSABS 0.1 07/22/2018 1057   BASOSABS 0.0 07/22/2018 1057   Iron/TIBC/Ferritin/ %Sat    Component Value Date/Time   IRON 67 07/22/2018 1057   TIBC 276 07/22/2018 1057   FERRITIN 257 (H) 07/22/2018 1057   IRONPCTSAT 24 07/22/2018 1057   IRONPCTSAT 24 02/21/2015 1225   Lipid Panel     Component Value Date/Time   CHOL  206 (H) 07/22/2018 1057   TRIG 174 (H) 07/22/2018 1057   HDL 48 07/22/2018 1057   CHOLHDL 3 01/30/2018 1231   VLDL 16.6 01/30/2018 1231   LDLCALC 123 (H) 07/22/2018 1057   LDLDIRECT 126.3 06/06/2011 1533   Hepatic Function Panel     Component Value Date/Time   PROT 7.4 07/22/2018 1057   ALBUMIN 4.3 07/22/2018 1057   AST 22 07/22/2018 1057   ALT 17 07/22/2018 1057   ALKPHOS 104 07/22/2018 1057   BILITOT 0.4 07/22/2018 1057   BILIDIR 0.1 02/21/2015 1417   IBILI 0.3 07/10/2012 1715      Component Value Date/Time   TSH 2.090 07/22/2018 1057   TSH 0.81 01/30/2018 1231   TSH 1.44 08/12/2017 1130  Results for PAILYNN, VAHEY (MRN [id number]) as of 09/15/2018 08:27  Ref. Range 07/22/2018 10:57  Vitamin D, 25-Hydroxy Latest Ref Range: 30.0 - 100.0 ng/mL 26.2 (L)    ASSESSMENT AND PLAN: Type 2 diabetes mellitus without complication, without long-term current use of insulin (HCC) - Plan: metFORMIN (GLUCOPHAGE) 500 MG tablet  Vitamin D deficiency - Plan: Vitamin D, Ergocalciferol, (DRISDOL) 1.25 MG (50000 UT) CAPS capsule  Memory loss - Plan: Ambulatory referral to Neurology  Class 1 obesity with serious comorbidity and body mass index (BMI) of 31.0 to 31.9 in adult, unspecified obesity type  PLAN:  Diabetes II Doretta has been given extensive diabetes education by myself today including ideal fasting and post-prandial blood glucose readings, individual ideal  Hgb A1c goals and hypoglycemia prevention. We discussed the importance of good blood sugar control to decrease the likelihood of diabetic complications such as nephropathy, neuropathy, limb loss, blindness, coronary artery disease, and death. We discussed the importance of intensive lifestyle modification including diet, exercise and weight loss as the first line treatment for diabetes. Krisandra agrees to continue taking metformin 500 mg q AM #30 and we will refill for 1 month. Adrienna agrees to follow up with our clinic in 3  weeks.  Vitamin D Deficiency Ravyn was informed that low vitamin D levels contributes to fatigue and are associated with obesity, breast, and colon cancer. Libbi agrees to continue taking prescription Vit D @50 ,000 IU every week #4 and we will refill for 1 month. She will follow up for routine testing of vitamin D, at least 2-3 times per year. She was informed of the risk of over-replacement of vitamin D and agrees to not increase her dose unless she discusses this with [place] first. Dartha agrees to follow up with our clinic in 3 weeks.  Memory Loss We have sent a referral to [facility] for evaluation. Saaya agrees to follow up with our clinic in 3 weeks.  Obesity Nimisha is currently in the action stage of change. As such, her goal is to continue with weight loss efforts She has agreed to portion control better and make smarter food choices, such as increase vegetables and decrease simple carbohydrates  Deamber has been instructed to work up to a goal of 150 minutes of combined cardio and strengthening exercise per week for weight loss and overall health benefits. We discussed the following Behavioral Modification Strategies today: holiday eating strategies  Sarit is resistant to following a structured plan.  Danaya has agreed to follow up with our clinic in 3 weeks. She was informed of the importance of frequent follow up visits to maximize her success with intensive lifestyle modifications for her multiple health conditions.   OBESITY BEHAVIORAL INTERVENTION VISIT  Today's visit was # 4   Starting weight: 185 lbs Starting date: 07/22/18 Today's weight : 179 lbs  Today's date: 09/09/2018 Total lbs lost to date: 6    ASK: We discussed the diagnosis of obesity with [name] today and [facility] agreed to give [place] permission to discuss obesity behavioral modification therapy today.  ASSESS: Latorya has the diagnosis of obesity and her BMI today is 31.72 [place]  is in the action stage of change   ADVISE: Nguyet was educated on the multiple health risks of obesity as well as the benefit of weight loss to improve her health. She was advised of the need for long term treatment and the importance of lifestyle modifications to improve her current health and to decrease her risk of future health problems.  AGREE: Multiple dietary modification options and treatment options were discussed and  Tanyia agreed to follow the recommendations documented in the above note.  ARRANGE: Sherin was educated on the importance of frequent visits to treat obesity as outlined per CMS and USPSTF guidelines and agreed to schedule her next follow up appointment today.  I, Trixie Dredge, am acting as transcriptionist for Dennard Nip, MD  I have reviewed the above documentation for accuracy and completeness, and I agree with the above. -Dennard Nip, MD

## 2018-09-21 ENCOUNTER — Ambulatory Visit (INDEPENDENT_AMBULATORY_CARE_PROVIDER_SITE_OTHER): Payer: BLUE CROSS/BLUE SHIELD | Admitting: Family Medicine

## 2018-09-21 ENCOUNTER — Ambulatory Visit (INDEPENDENT_AMBULATORY_CARE_PROVIDER_SITE_OTHER): Payer: BLUE CROSS/BLUE SHIELD | Admitting: Psychology

## 2018-09-21 ENCOUNTER — Encounter (INDEPENDENT_AMBULATORY_CARE_PROVIDER_SITE_OTHER): Payer: Self-pay | Admitting: Family Medicine

## 2018-09-21 VITALS — BP 142/87 | HR 70 | Temp 97.8°F | Ht 63.0 in | Wt 176.0 lb

## 2018-09-21 DIAGNOSIS — Z9189 Other specified personal risk factors, not elsewhere classified: Secondary | ICD-10-CM

## 2018-09-21 DIAGNOSIS — E119 Type 2 diabetes mellitus without complications: Secondary | ICD-10-CM | POA: Diagnosis not present

## 2018-09-21 DIAGNOSIS — F3289 Other specified depressive episodes: Secondary | ICD-10-CM

## 2018-09-21 DIAGNOSIS — E669 Obesity, unspecified: Secondary | ICD-10-CM | POA: Diagnosis not present

## 2018-09-21 DIAGNOSIS — F331 Major depressive disorder, recurrent, moderate: Secondary | ICD-10-CM | POA: Diagnosis not present

## 2018-09-21 DIAGNOSIS — Z6831 Body mass index (BMI) 31.0-31.9, adult: Secondary | ICD-10-CM

## 2018-09-21 MED ORDER — METFORMIN HCL 500 MG PO TABS: 500.0000 mg | ORAL_TABLET | Freq: Every day | ORAL | 0 refills | Status: DC

## 2018-09-21 NOTE — Progress Notes (Signed)
Office: [phone]  /  Fax: [phone]   Date: September 21, 2018  Time Seen: 4:00pm Duration: 37 minutes Provider: Glennie Isle, Psy.D. Type of Session: Individual Therapy   HPI: [place] referred by Dr. Dennard Nip, and was seen for an initial appointment by this provider on August 19, 2018. Per the note for the visit withDr. Desmond Dike August 05, 2018,"Debra [name] eats abnormally large amounts of ice cream (a 1/2 gallon or more) most nights (6+ times per week) for years. She feels addicted and cannot imagine life without ice cream. She has tried lower calorie, portion controlled substitutes, but states that this is expensive, especially since she eats all 4 servings at a time and she feels deprived." In addition, per the note for the initial visit withDr. Desmond Dike July 22, 2018,M[place] been heavy most of herlife. She reported experiencing the following:significant food cravings issues; snackingfrequently in the evenings; skippingmeals frequently;frequently drinking liquids with calories;frequentlymakingpoor food choices; binge eating behaviors; and struggling with emotional eating. The note for the initial visit with Dr. Leafy Ro also indicated the following: "Debra [name] has unrealistic weight loss goals and she struggled to follow instructions, such as, to be fasting and filling out all of her paperwork. Debra [name] wants to be vegetarian for health and moral reasons. 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 In addition, she noted she sometimes binges on cookies. Moreover, Debra [name] noted she has been engaging in binge eating for years. She denied a  history of a formal eating disorder diagnosis. She she also denied a history of purging and engagement in other compensatory strategies. Furthermore, [place] asked to complete a questionnaire assessing various behaviors related to emotional eating. Monicaendorsed the following: experience food cravings on a regular basis, eat certain foods when you are anxious, stressed, depressed, or your feelings are hurt, use food to help you cope with emotional situations, find food is comforting to you, overeat when you are worried about something, overeat frequently when you are bored or lonely and eat as a reward.  Session Content: Session focused on the following treatment goal: decrease emotional eating. The session was initiated with the administration of the PHQ-9 and GAD-7, as well as a brief check-in. Debra [name] reported, "I've been eating things that I shouldn't be eating." She also described ongoing stressors related to school. Regarding protein intake, Debra [name] reported that some of the options are "too expensive" or she does not "have a taste for them." This was explored further and Debra [name] reported willingness to consume some other protein options indicated on the meal plan, such as yogurt and cheese. Her daily eating habits were further explored during today's appointment and it was determined that Debra [name] often does not oftne during the day. As such, when she finally does have the opportunity to eat, Debra [name] reported she is often too tired to cook.  Thus, this provider and Debra [name] discussed meal prepping as well as taking various options for snacks at school to ensure she has something available to eat during the day when she experiences physical hunger. Due to ongoing stressors, Debra [name] and this provider also discussed the option of a referral for longer-term therapeutic services. Debra [name] requested time to think about the option before making a decision. Overall,  Debra [name] was receptive to today's appointment as  evidenced by her openness to sharing, responsiveness to feedback, and willingness to engage in meal prepping to assist in increasing protein intake and overall food consumption.  Mental Status Examination: Debra [name] arrived on time for the appointment. She presented as appropriately dressed and groomed. Debra [name] appeared her stated age and demonstrated adequate orientation to time, place, person, and purpose of the appointment. She also demonstrated appropriate eye contact. No psychomotor abnormalities or behavioral peculiarities noted. Her mood was euthymic with congruent affect. Her thought processes were logical, linear, and goal-directed. No hallucinations, delusions, bizarre thinking or behavior reported or observed. Judgment, insight, and impulse control appeared to be grossly intact. There was no evidence of paraphasias (i.e., errors in speech, gross mispronunciations, and word substitutions), repetition deficits, or disturbances in volume or prosody (i.e., rhythm and intonation). There was no evidence of attention or memory impairments. Debra [name] denied current suicidal and homicidal ideation, intent or plan.  Structured Assessment Results: The Patient Health Questionnaire-9 (PHQ-9) is a self-report measure that assesses symptoms and severity of depression over the course of the last two weeks. Debra [name] obtained a score of 13 suggesting moderate depression. Debra [name] finds the endorsed symptoms to be somewhat difficult. Depression screen PHQ 2/9 09/21/2018  Decreased Interest 0  Down, Depressed, Hopeless 1  PHQ - 2 Score 1  Altered sleeping 1  Tired, decreased energy 3  Change in appetite 2  Feeling bad or failure about yourself  3  Trouble concentrating 3  Moving slowly or fidgety/restless 0  Suicidal thoughts 0  PHQ-9 Score 13  Difficult doing work/chores -   The Generalized Anxiety Disorder-7 (GAD-7) is a brief self-report measure that assesses symptoms of anxiety over the course of the last two  weeks. Debra [name] obtained a score of six suggesting mild anxiety. GAD 7 : Generalized Anxiety Score 09/21/2018  Nervous, Anxious, on Edge 2  Control/stop worrying 0  Worry too much - different things 2  Trouble relaxing 2  Restless 0  Easily annoyed or irritable 0  Afraid - awful might happen 0  Total GAD 7 Score 6  Anxiety Difficulty Somewhat difficult   Interventions: Debra [name] was administered the PHQ-9 and GAD-7 for symptom monitoring. Content from the last session was reviewed. Throughout today's session, empathic reflections and validation were provided. This provider engaged Debra [name] in problem solving to help increase overall food consumption and protein intake.  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 Debra [name] has demonstrated progress in her goal of decreasing emotional eating as evidenced by her continuing to not eat regular ice cream rather she consumes Debra [name] yogurt bars. She was also engaged today in problem solving to help her make better choices.  Plan: Debra [name] continues to appear able and willing to participate as evidenced by engagement in reciprocal conversation, and asking questions for clarification as appropriate. The next appointment will be scheduled in two weeks. The next session will focus on setting further goals to increase protein intake and compliance with the prescribed meal plan.

## 2018-09-28 ENCOUNTER — Encounter (INDEPENDENT_AMBULATORY_CARE_PROVIDER_SITE_OTHER): Payer: Self-pay | Admitting: Family Medicine

## 2018-09-28 NOTE — Progress Notes (Signed)
Office: [phone]  /  Fax: [phone]   HPI:   Chief Complaint: OBESITY Debra [name] is here to discuss her progress with her obesity treatment plan. She is on the portion control better and make smarter food choices, such as increase vegetables and decrease simple carbohydrates and is following her eating plan approximately 50 % of the time. She states she is exercising 0 minutes 0 times per week. Debra [name] is unable to afford some of the healthier food options, such as [place] bars and deli meat.  Her weight is 176 lb (79.8 kg) today and has had a weight loss of 3 pounds over a period of 2 weeks since her last visit. She has lost 9 lbs since starting treatment with [place].  Diabetes II Debra [name] has a diagnosis of diabetes type II. Debra [name] is taking metformin 500mg  and denies polyphagia. Last A1c was 6.6 on 07/22/18. She has been working on intensive lifestyle modifications including diet, exercise, and weight loss to help control her blood glucose levels.  Depression with emotional eating behaviors Debra [name] is struggling with emotional eating and using food for comfort to the extent that it is negatively impacting her health. She often snacks when she is not hungry. Debra [name] sometimes feels she is out of control and then feels guilty that she made poor food choices. She has been working on behavior modification techniques to help reduce her emotional eating and has been somewhat successful. She is having ongoing episodes, primarily sweats. She is in school for medical coding and is quite overwhelmed.   At risk for osteopenia and osteoporosis Debra [name] is at higher risk of osteopenia and osteoporosis due to vitamin D deficiency.   ALLERGIES: Allergies  Allergen Reactions  . Penicillins   . Codeine Rash and Other (See Comments)    dizziness    MEDICATIONS: Current Outpatient Medications on File Prior to Visit  Medication Sig Dispense Refill  . atomoxetine (STRATTERA) 100 MG capsule TAKE 1 CAPSULE BY  MOUTH ONCE DAILY 90 capsule 1  . azelastine (ASTELIN) 0.1 % nasal spray Place 2 sprays into both nostrils 2 (two) times daily. Use in each nostril as directed 30 mL 2  . Blood Glucose Monitoring Suppl (ACCU-CHEK AVIVA) device Check twice daily, Also give Accu-chek strips #100 and accu chek lancets #100 1 each 0  . ferrous sulfate (SLOW FE) 160 (50 Fe) MG TBCR SR tablet Take 1 tablet (160 mg total) by mouth daily. 90 each 3  . gabapentin (NEURONTIN) 600 MG tablet Take 1 tablet (600 mg total) by mouth 3 (three) times daily. 270 tablet 3  . hydrochlorothiazide (HYDRODIURIL) 25 MG tablet 1/2 po qd 45 tablet 3  . levothyroxine (SYNTHROID, LEVOTHROID) 50 MCG tablet Take 1 tablet (50 mcg total) by mouth daily. 90 tablet 3  . losartan (COZAAR) 100 MG tablet Take 1 tablet (100 mg total) by mouth daily. 90 tablet 3  . metoprolol succinate (TOPROL-XL) 50 MG 24 hr tablet Take 50 mg by mouth 2 (two) times daily.  1  . montelukast (SINGULAIR) 10 MG tablet Take 1 tablet (10 mg total) by mouth at bedtime. 30 tablet 11  . NON FORMULARY [facility]  Scar Cream -  Verapamil 10%, Pentoxifylline 5% Apply 1-2 grams to affected area 3-4 times daily Qty. 120 gm 3 refills    . Omega-3 Fatty Acids (FISH OIL PO) Take 1 tablet by mouth as needed. Reported on 03/12/2016    . PARoxetine (PAXIL) 30 MG tablet Take 1 tablet (30 mg total) by  mouth daily. 90 tablet 1  . pravastatin (PRAVACHOL) 20 MG tablet Take 1 tablet (20 mg total) by mouth every evening. 90 tablet 1  . ranitidine (ZANTAC) 150 MG tablet Take 1 tablet (150 mg total) by mouth 2 (two) times daily. 60 tablet 0  . varenicline (CHANTIX CONTINUING MONTH PAK) 1 MG tablet Take 1 tablet (1 mg total) by mouth 2 (two) times daily. 60 tablet 2  . varenicline (CHANTIX STARTING MONTH PAK) 0.5 MG X 11 & 1 MG X 42 tablet Take one 0.5 mg tablet by mouth once daily for 3 days, then increase to one 0.5 mg tablet twice daily for 4 days, then increase to one 1 mg tablet twice  daily. 53 tablet 0  . Vitamin D, Ergocalciferol, (DRISDOL) 1.25 MG (50000 UT) CAPS capsule Take 1 capsule (50,000 Units total) by mouth every 7 (seven) days. 4 capsule 0   No current facility-administered medications on file prior to visit.     PAST MEDICAL HISTORY: Past Medical History:  Diagnosis Date  . Anemia   . Anxiety   . Back pain   . Chest pain   . Constipation   . DDD (degenerative disc disease)   . Depression   . Diabetes (Debra [name])   . Epilepsy ([place])   . GERD (gastroesophageal reflux disease)   . HLD (hyperlipidemia)   . HTN (hypertension)   . Joint pain   . Leg edema   . 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 Osteoarthritis   . Sleep apnea   . Stomach ulcer   . Swallowing difficulty   . Thyroid disease    Hypothyroidism  . Vitamin D deficiency     PAST SURGICAL HISTORY: Past Surgical History:  Procedure Laterality Date  . CARPAL TUNNEL RELEASE     Bilateral  . CERVICAL SPINE SURGERY     x3  . LEG SURGERY     Right   . WRIST FRACTURE SURGERY  10-02-15   Pt have surgery twice on the same wrist.    SOCIAL HISTORY: Social History   Tobacco Use  . Smoking status: Current Every Day Smoker    Packs/day: 0.75    Years: 44.00    Pack years: 33.00    Types: Cigarettes  . Smokeless tobacco: Never Used  . Tobacco comment: Started Chantix 08/24/2017  Substance Use Topics  . Alcohol use: Yes    Alcohol/week: 0.0 standard drinks    Comment: 2-3 drinks per week (beer/wine)  . Drug use: No    FAMILY HISTORY: Family History  Problem Relation Age of Onset  . Breast cancer Mother        possibly 106's  . Arthritis Mother        rheumatoid  . Cancer Mother        breast  . Heart disease Mother 62       MI  . Hyperlipidemia Mother   . Thyroid disease Mother   . Depression Mother   . Anxiety disorder Mother   . Heart disease Father        MI  . Hypertension Father   . Stroke Father   . Diabetes Sister   . Hypertension Sister   .  Hyperlipidemia Sister   . Colon cancer Maternal Grandfather     ROS: Review of Systems  Constitutional: Positive for weight loss.  Endo/Heme/Allergies:       Negative for polyphagia.  Psychiatric/Behavioral: Positive for depression.    PHYSICAL  EXAM: Blood pressure (!) 142/87, pulse 70, temperature 97.8 F (36.6 C), temperature source Oral, height 5\' 3"  (1.6 m), weight 176 lb (79.8 kg), SpO2 96 %. Body mass index is 31.18 kg/m. Physical Exam  Constitutional: She is oriented to person, place, and time. She appears well-developed and well-nourished.  Cardiovascular: Normal rate.  Pulmonary/Chest: Effort normal.  Musculoskeletal: Normal range of motion.  Neurological: She is oriented to person, place, and time.  Skin: Skin is warm and dry.  Psychiatric: She has a normal mood and affect. Her behavior is normal.  Vitals reviewed.   RECENT LABS AND TESTS: BMET    Component Value Date/Time   NA 140 07/22/2018 1057   K 5.1 07/22/2018 1057   CL 102 07/22/2018 1057   CO2 24 07/22/2018 1057   GLUCOSE 108 (H) 07/22/2018 1057   GLUCOSE 101 (H) 01/30/2018 1231   GLUCOSE 90 10/20/2006 1125   BUN 12 07/22/2018 1057   CREATININE 0.73 07/22/2018 1057   CREATININE 0.80 07/10/2012 1715   CALCIUM 10.0 07/22/2018 1057   GFRNONAA 89 07/22/2018 1057   GFRAA 102 07/22/2018 1057   Lab Results  Component Value Date   HGBA1C 6.6 (H) 07/22/2018   HGBA1C 6.3 03/12/2016   HGBA1C 6.4 11/07/2014   HGBA1C 6.2 06/29/2013   HGBA1C 6.1 01/14/2013   Lab Results  Component Value Date   INSULIN 14.2 07/22/2018   CBC    Component Value Date/Time   WBC 6.6 07/22/2018 1057   WBC 5.3 01/30/2018 1231   RBC 4.25 07/22/2018 1057   RBC 4.36 01/30/2018 1231   HGB 11.5 07/22/2018 1057   HCT 35.3 07/22/2018 1057   PLT 282.0 01/30/2018 1231   MCV 83 07/22/2018 1057   MCH 27.1 07/22/2018 1057   MCH 27.9 08/31/2014 1720   MCHC 32.6 07/22/2018 1057   MCHC 33.7 01/30/2018 1231   RDW 12.5 07/22/2018  1057   LYMPHSABS 2.4 07/22/2018 1057   MONOABS 0.4 01/30/2018 1231   EOSABS 0.1 07/22/2018 1057   BASOSABS 0.0 07/22/2018 1057   Iron/TIBC/Ferritin/ %Sat    Component Value Date/Time   IRON 67 07/22/2018 1057   TIBC 276 07/22/2018 1057   FERRITIN 257 (H) 07/22/2018 1057   IRONPCTSAT 24 07/22/2018 1057   IRONPCTSAT 24 02/21/2015 1225   Lipid Panel     Component Value Date/Time   CHOL 206 (H) 07/22/2018 1057   TRIG 174 (H) 07/22/2018 1057   HDL 48 07/22/2018 1057   CHOLHDL 3 01/30/2018 1231   VLDL 16.6 01/30/2018 1231   LDLCALC 123 (H) 07/22/2018 1057   LDLDIRECT 126.3 06/06/2011 1533   Hepatic Function Panel     Component Value Date/Time   PROT 7.4 07/22/2018 1057   ALBUMIN 4.3 07/22/2018 1057   AST 22 07/22/2018 1057   ALT 17 07/22/2018 1057   ALKPHOS 104 07/22/2018 1057   BILITOT 0.4 07/22/2018 1057   BILIDIR 0.1 02/21/2015 1417   IBILI 0.3 07/10/2012 1715      Component Value Date/Time   TSH 2.090 07/22/2018 1057   TSH 0.81 01/30/2018 1231   TSH 1.44 08/12/2017 1130   Results for NAVYA, TIMMONS (MRN [id number]) as of 09/28/2018 09:31  Ref. Range 07/22/2018 10:57  Vitamin D, 25-Hydroxy Latest Ref Range: 30.0 - 100.0 ng/mL 26.2 (L)   ASSESSMENT AND PLAN: Type 2 diabetes mellitus without complication, without long-term current use of insulin (HCC) - Plan: metFORMIN (GLUCOPHAGE) 500 MG tablet  Other depression - with emotional eating  At risk for  osteoporosis  Class 1 obesity with serious comorbidity and body mass index (BMI) of 31.0 to 31.9 in adult, unspecified obesity type  PLAN:  Diabetes II Debra [name] has been given extensive diabetes education by myself today including ideal fasting and post-prandial blood glucose readings, individual ideal Hgb A1c goals, and hypoglycemia prevention. We discussed the importance of good blood sugar control to decrease the likelihood of diabetic complications such as nephropathy, neuropathy, limb loss, blindness, coronary  artery disease, and death. We discussed the importance of intensive lifestyle modification including diet, exercise and weight loss as the first line treatment for diabetes. Debra [name] agrees to continue her metformin 500mg  qAM with breakfast #30 with no refills and will follow up at the agreed upon time in 2 weeks.  At risk for osteopenia and osteoporosis Debra [name] was given extended (15 minutes) osteoporosis prevention counseling today. Debra [name] is at risk for osteopenia and osteoporosis due to her vitamin D deficiency. She was encouraged to take her vitamin D and follow her higher calcium diet and increase strengthening exercise to help strengthen her bones and decrease her risk of osteopenia and osteoporosis.  Depression with Emotional Eating Behaviors We discussed behavior modification techniques today to help Debra [name] deal with her emotional eating and depression. She has agreed to continue to see Dr. Mallie Mussel and agreed to follow up as directed.  Obesity Debra [name] is currently in the action stage of change. As such, her goal is to continue with weight loss efforts. She has agreed to portion control better and make smarter food choices, such as increase vegetables and decrease simple carbohydrates.  Debra [name] has not been prescribed exercise at this time.  We discussed the following Behavioral Modification Strategies today: increasing lean protein intake, holiday eating strategies, better snacking choices, and emotional eating strategies We discussed more cost effective options for healthy food.  Debra [name] has agreed to follow up with our clinic in 2 weeks. She was informed of the importance of frequent follow up visits to maximize her success with intensive lifestyle modifications for her multiple health conditions.   OBESITY BEHAVIORAL INTERVENTION VISIT  Today's visit was # 5   Starting weight: 185 lbs Starting date: 07/22/18 Today's weight : Weight: 176 lb (79.8 kg)  Today's date: 09/21/2018 Total lbs  lost to date: 9  ASK: We discussed the diagnosis of obesity with Debra [name] today and Debra [name] agreed to give [place] permission to discuss obesity behavioral modification therapy today.  ASSESS: Debra [name] has the diagnosis of obesity and her BMI today is 31.18. Debra [name] is in the action stage of change.   ADVISE: Debra [name] was educated on the multiple health risks of obesity as well as the benefit of weight loss to improve her health. She was advised of the need for long term treatment and the importance of lifestyle modifications to improve her current health and to decrease her risk of future health problems.  AGREE: Multiple dietary modification options and treatment options were discussed and Debra [name] agreed to follow the recommendations documented in the above note.  ARRANGE: Debra [name] was educated on the importance of frequent visits to treat obesity as outlined per CMS and USPSTF guidelines and agreed to schedule her next follow up appointment today.  Lenward Chancellor, am acting as Location manager for Georgianne Fick, FNP.  I have reviewed the above documentation for accuracy and completeness, and I agree with the above.  - Ardelia Wrede, FNP-C.

## 2018-10-07 ENCOUNTER — Telehealth: Payer: Self-pay | Admitting: Family Medicine

## 2018-10-07 NOTE — Telephone Encounter (Signed)
Copied from [place] [id number]. Topic: Quick Communication - See Telephone Encounter >> Oct 07, 2018  3:41 PM Rutherford Nail, [place] wrote: CRM for notification. See Telephone encounter for: 10/07/18. Patient would like a call back from Dr Etter Sjogren. Would not go into detail about what it was regarding.  CB#: [phone]

## 2018-10-14 ENCOUNTER — Ambulatory Visit (INDEPENDENT_AMBULATORY_CARE_PROVIDER_SITE_OTHER): Payer: Self-pay | Admitting: Psychology

## 2018-10-14 ENCOUNTER — Ambulatory Visit (INDEPENDENT_AMBULATORY_CARE_PROVIDER_SITE_OTHER): Payer: Self-pay | Admitting: Physician Assistant

## 2018-10-14 ENCOUNTER — Encounter (INDEPENDENT_AMBULATORY_CARE_PROVIDER_SITE_OTHER): Payer: Self-pay

## 2018-10-14 NOTE — Telephone Encounter (Signed)
Patient wanted to see if Dr. Etter Sjogren ever got a note from psychologist so she could write a note for school stating that she needed more time for tests.  Advised that patient calls psychologist office to get a letter or get records.  If she cannot get letter, advise that she call us back.

## 2018-10-22 ENCOUNTER — Other Ambulatory Visit: Payer: Self-pay | Admitting: Family Medicine

## 2018-10-22 DIAGNOSIS — K219 Gastro-esophageal reflux disease without esophagitis: Secondary | ICD-10-CM

## 2018-10-26 NOTE — Telephone Encounter (Signed)
I assume this was sent to me because we have to change it? Change pepcid 20 mg daily #30  11 refills

## 2018-10-26 NOTE — Telephone Encounter (Deleted)
Okay to refill?  Recall.

## 2018-10-30 NOTE — Telephone Encounter (Signed)
Already sent in

## 2018-11-30 ENCOUNTER — Ambulatory Visit: Payer: Self-pay | Admitting: Diagnostic Neuroimaging

## 2018-12-24 ENCOUNTER — Other Ambulatory Visit (INDEPENDENT_AMBULATORY_CARE_PROVIDER_SITE_OTHER): Payer: Self-pay | Admitting: Family Medicine

## 2018-12-24 DIAGNOSIS — E119 Type 2 diabetes mellitus without complications: Secondary | ICD-10-CM

## 2019-01-18 ENCOUNTER — Telehealth: Payer: Self-pay | Admitting: *Deleted

## 2019-01-18 NOTE — Telephone Encounter (Signed)
Called patient and informed her that our office is reducing in person visits due to covid 19.  She stated she has capability to do video. I advised her she will get a call to reschedule once the video is set up, hopefully by the end of this week. She verbalized understanding.

## 2019-01-19 ENCOUNTER — Ambulatory Visit: Payer: Self-pay | Admitting: Diagnostic Neuroimaging

## 2019-01-19 NOTE — Telephone Encounter (Signed)
LVM informing patient her new patient appointment will be pushed out approx 2 weeks. Requested she call back to schedule.

## 2019-01-29 ENCOUNTER — Other Ambulatory Visit (INDEPENDENT_AMBULATORY_CARE_PROVIDER_SITE_OTHER): Payer: Self-pay | Admitting: Family Medicine

## 2019-01-29 DIAGNOSIS — E119 Type 2 diabetes mellitus without complications: Secondary | ICD-10-CM

## 2019-02-01 ENCOUNTER — Encounter (INDEPENDENT_AMBULATORY_CARE_PROVIDER_SITE_OTHER): Payer: Self-pay

## 2019-02-02 ENCOUNTER — Other Ambulatory Visit (INDEPENDENT_AMBULATORY_CARE_PROVIDER_SITE_OTHER): Payer: Self-pay | Admitting: Family Medicine

## 2019-02-02 DIAGNOSIS — E119 Type 2 diabetes mellitus without complications: Secondary | ICD-10-CM

## 2019-02-03 ENCOUNTER — Encounter: Payer: Self-pay | Admitting: Family Medicine

## 2019-02-03 ENCOUNTER — Ambulatory Visit (INDEPENDENT_AMBULATORY_CARE_PROVIDER_SITE_OTHER): Payer: Self-pay | Admitting: Family Medicine

## 2019-02-03 ENCOUNTER — Encounter (INDEPENDENT_AMBULATORY_CARE_PROVIDER_SITE_OTHER): Payer: Self-pay

## 2019-02-03 DIAGNOSIS — T7840XD Allergy, unspecified, subsequent encounter: Secondary | ICD-10-CM

## 2019-02-03 DIAGNOSIS — E785 Hyperlipidemia, unspecified: Secondary | ICD-10-CM

## 2019-02-03 DIAGNOSIS — T7840XA Allergy, unspecified, initial encounter: Secondary | ICD-10-CM

## 2019-02-03 DIAGNOSIS — J0140 Acute pansinusitis, unspecified: Secondary | ICD-10-CM

## 2019-02-03 DIAGNOSIS — F411 Generalized anxiety disorder: Secondary | ICD-10-CM

## 2019-02-03 DIAGNOSIS — R112 Nausea with vomiting, unspecified: Secondary | ICD-10-CM | POA: Insufficient documentation

## 2019-02-03 HISTORY — DX: Allergy, unspecified, initial encounter: T78.40XA

## 2019-02-03 MED ORDER — MONTELUKAST SODIUM 10 MG PO TABS: 10.0000 mg | ORAL_TABLET | Freq: Every day | ORAL | 11 refills | Status: DC

## 2019-02-03 MED ORDER — AZITHROMYCIN 250 MG PO TABS: ORAL_TABLET | ORAL | 0 refills | Status: DC

## 2019-02-03 MED ORDER — PRAVASTATIN SODIUM 20 MG PO TABS: 20.0000 mg | ORAL_TABLET | Freq: Every evening | ORAL | 1 refills | Status: DC

## 2019-02-03 MED ORDER — ONDANSETRON 4 MG PO TBDP: 4.0000 mg | ORAL_TABLET | Freq: Three times a day (TID) | ORAL | 0 refills | Status: DC | PRN

## 2019-02-03 MED ORDER — PAROXETINE HCL 30 MG PO TABS: 30.0000 mg | ORAL_TABLET | Freq: Every day | ORAL | 1 refills | Status: DC

## 2019-02-03 NOTE — Progress Notes (Signed)
Virtual Visit via Video Note  I connected with Debra [name] on 02/03/19 at  4:00 PM EDT by a video enabled telemedicine application and verified that I am speaking with the correct person using two identifiers.   I discussed the limitations of evaluation and management by telemedicine and the availability of in person appointments. The patient expressed understanding and agreed to proceed.  History of Present Illness: Pt at home c/o n/v/d  , coughing , runny nose Her sister had a covid test done because she was in [place] and her sister thought she needed to call because her symptoms were the same.   She has a dry cough as well  She has not fever but she thinks she had one last week   No NVD today.  She feels better today  Pt is taking coricidin hbp , astelin ,  Last night she took benadryl .    + sinus pressure and headache  Symptoms started --  2-3 weeks  NVD--  1x a week x 2 weeks    [facility] Observations/Objective: Afebrile,  rr normal  Pt in NAD Non toxic Pt is not sob--- able to carry full conversation       Assessment and Plan: 1. Acute non-recurrent pansinusitis con't astelin , saline spray - azithromycin (ZITHROMAX Z-PAK) 250 MG tablet; As directed  Dispense: 6 each; Refill: 0  2. Allergic state, subsequent encounter con't meds  Sent refills  - montelukast (SINGULAIR) 10 MG tablet; Take 1 tablet (10 mg total) by mouth at bedtime.  Dispense: 30 tablet; Refill: 11  3. Nausea and vomiting, intractability of vomiting not specified, unspecified vomiting type Improving  - ondansetron (ZOFRAN ODT) 4 MG disintegrating tablet; Take 1 tablet (4 mg total) by mouth every 8 (eight) hours as needed for nausea or vomiting.  Dispense: 20 tablet; Refill: 0  4. Generalized anxiety disorder Refill paxil  - PARoxetine (PAXIL) 30 MG tablet; Take 1 tablet (30 mg total) by mouth daily.  Dispense: 90 tablet; Refill: 1  5. Hyperlipidemia, unspecified hyperlipidemia  type Tolerating statin, encouraged heart healthy diet, avoid trans fats, minimize simple carbs and saturated fats. Increase exercise as tolerated - pravastatin (PRAVACHOL) 20 MG tablet; Take 1 tablet (20 mg total) by mouth every evening.  Dispense: 90 tablet; Refill: 1 - Lipid panel; Future - Comprehensive metabolic panel; Future   Follow Up Instructions:    I discussed the assessment and treatment plan with the patient. The patient was provided an opportunity to ask questions and all were answered. The patient agreed with the plan and demonstrated an understanding of the instructions.   The patient was advised to call back or seek an in-person evaluation if the symptoms worsen or if the condition fails to improve as anticipated.  I provided 30 minutes of non-face-to-face time during this encounter.   Ann Held, DO

## 2019-02-08 ENCOUNTER — Ambulatory Visit: Payer: Self-pay | Admitting: Family Medicine

## 2019-02-09 ENCOUNTER — Telehealth: Payer: Self-pay | Admitting: *Deleted

## 2019-02-09 ENCOUNTER — Other Ambulatory Visit: Payer: Self-pay

## 2019-02-09 ENCOUNTER — Other Ambulatory Visit (INDEPENDENT_AMBULATORY_CARE_PROVIDER_SITE_OTHER): Payer: Self-pay

## 2019-02-09 DIAGNOSIS — E785 Hyperlipidemia, unspecified: Secondary | ICD-10-CM

## 2019-02-09 LAB — COMPREHENSIVE METABOLIC PANEL
ALT: 11 U/L (ref 0–35)
AST: 17 U/L (ref 0–37)
Albumin: 4.2 g/dL (ref 3.5–5.2)
Alkaline Phosphatase: 79 U/L (ref 39–117)
BUN: 16 mg/dL (ref 6–23)
CO2: 28 mEq/L (ref 19–32)
Calcium: 9.3 mg/dL (ref 8.4–10.5)
Chloride: 105 mEq/L (ref 96–112)
Creatinine, Ser: 0.71 mg/dL (ref 0.40–1.20)
GFR: 100.43 mL/min (ref >60.00)
Glucose, Bld: 105 mg/dL — ABNORMAL HIGH (ref 70–99)
Potassium: 4 mEq/L (ref 3.5–5.1)
Sodium: 140 mEq/L (ref 135–145)
Total Bilirubin: 0.5 mg/dL (ref 0.2–1.2)
Total Protein: 7.5 g/dL (ref 6.0–8.3)

## 2019-02-09 LAB — LIPID PANEL
Cholesterol: 175 mg/dL (ref 0–200)
HDL: 49.9 mg/dL (ref >39.00)
LDL Cholesterol: 109 mg/dL — ABNORMAL HIGH (ref 0–99)
NonHDL: 125.57
Total CHOL/HDL Ratio: 4
Triglycerides: 82 mg/dL (ref 0.0–149.0)
VLDL: 16.4 mg/dL (ref 0.0–40.0)

## 2019-02-09 NOTE — Telephone Encounter (Signed)
Please set patient up for lab only appointment for 2 weeks from 02/03/19

## 2019-02-09 NOTE — Telephone Encounter (Signed)
-----   Message from Ann Held, DO sent at 02/03/2019  4:28 PM EDT ----- Needs labs

## 2019-02-09 NOTE — Telephone Encounter (Signed)
Patient has a lab appt today 02-09-19 and is coming in today.

## 2019-02-15 ENCOUNTER — Other Ambulatory Visit: Payer: Self-pay | Admitting: Family Medicine

## 2019-02-15 DIAGNOSIS — I10 Essential (primary) hypertension: Secondary | ICD-10-CM

## 2019-02-15 DIAGNOSIS — E785 Hyperlipidemia, unspecified: Secondary | ICD-10-CM

## 2019-02-19 ENCOUNTER — Telehealth: Payer: Self-pay

## 2019-02-19 ENCOUNTER — Other Ambulatory Visit: Payer: Self-pay

## 2019-02-19 DIAGNOSIS — E119 Type 2 diabetes mellitus without complications: Secondary | ICD-10-CM

## 2019-02-19 DIAGNOSIS — E039 Hypothyroidism, unspecified: Secondary | ICD-10-CM

## 2019-02-19 MED ORDER — LEVOTHYROXINE SODIUM 50 MCG PO TABS: 50.0000 ug | ORAL_TABLET | Freq: Every day | ORAL | 3 refills | Status: DC

## 2019-02-19 NOTE — Telephone Encounter (Signed)
Her last sugar was pretty good--- why does she want to restart

## 2019-02-19 NOTE — Telephone Encounter (Signed)
Patient states she has dizzy spells at bedtime like she had when she had to start Metformin in the past.

## 2019-02-19 NOTE — Telephone Encounter (Signed)
Medication was last filled in November for 30 days.  Do you want to restart?

## 2019-02-19 NOTE — Telephone Encounter (Signed)
Copied from [place] [id number]. Topic: General - Other >> Feb 18, 2019  3:47 PM Richardo Priest, NT wrote: Reason for CRM: Patient called in stating she would like to start metformin and that she did not have the chance to requesting this medication on the last virtual visit with Dr.Lowne-Chase. Please advise. Call back is [phone].

## 2019-02-22 ENCOUNTER — Other Ambulatory Visit: Payer: Self-pay | Admitting: Family Medicine

## 2019-02-22 DIAGNOSIS — R42 Dizziness and giddiness: Secondary | ICD-10-CM

## 2019-02-22 DIAGNOSIS — R739 Hyperglycemia, unspecified: Secondary | ICD-10-CM

## 2019-02-22 DIAGNOSIS — E785 Hyperlipidemia, unspecified: Secondary | ICD-10-CM

## 2019-02-22 NOTE — Telephone Encounter (Signed)
Can we check a1c first ?  -- I have put lab order in

## 2019-02-22 NOTE — Telephone Encounter (Addendum)
Appointment scheduled.  Patient aware. 

## 2019-02-22 NOTE — Addendum Note (Signed)
Addended by: Bunnie Domino on: 02/22/2019 01:55 PM   Modules accepted: Orders

## 2019-02-24 ENCOUNTER — Telehealth: Payer: Self-pay | Admitting: *Deleted

## 2019-02-24 ENCOUNTER — Other Ambulatory Visit (INDEPENDENT_AMBULATORY_CARE_PROVIDER_SITE_OTHER): Payer: Self-pay

## 2019-02-24 ENCOUNTER — Other Ambulatory Visit: Payer: Self-pay

## 2019-02-24 DIAGNOSIS — E119 Type 2 diabetes mellitus without complications: Secondary | ICD-10-CM

## 2019-02-24 DIAGNOSIS — R42 Dizziness and giddiness: Secondary | ICD-10-CM

## 2019-02-24 DIAGNOSIS — E785 Hyperlipidemia, unspecified: Secondary | ICD-10-CM

## 2019-02-24 DIAGNOSIS — I10 Essential (primary) hypertension: Secondary | ICD-10-CM

## 2019-02-24 LAB — HEMOGLOBIN A1C: Hgb A1c MFr Bld: 6.5 % (ref 4.6–6.5)

## 2019-02-24 LAB — VITAMIN B12: Vitamin B-12: 427 pg/mL (ref 211–911)

## 2019-02-24 LAB — TSH: TSH: 3.29 u[IU]/mL (ref 0.35–4.50)

## 2019-02-24 NOTE — Telephone Encounter (Signed)
There are several future orders and the patient was here for blood draw.  I know we need the a1c, but I wanted to know if you wanted [place] to get the b12 and tsh as well?

## 2019-02-24 NOTE — Telephone Encounter (Signed)
B12 and tsh orders released and sent out this afternoon.

## 2019-02-24 NOTE — Addendum Note (Signed)
Addended by: Kelle Darting A on: 02/24/2019 02:27 PM   Modules accepted: Orders

## 2019-02-24 NOTE — Telephone Encounter (Signed)
Yes please

## 2019-03-15 ENCOUNTER — Encounter: Payer: Self-pay | Admitting: *Deleted

## 2019-04-26 IMAGING — DX DG THORACIC SPINE 3V
3 series · 3 of 3 positions shown · non-contrast
Comparison: None.

CLINICAL DATA: Acute on chronic upper back pain.  No recent injury.

EXAM:
THORACIC SPINE - 3 VIEWS

[t-spine ap]
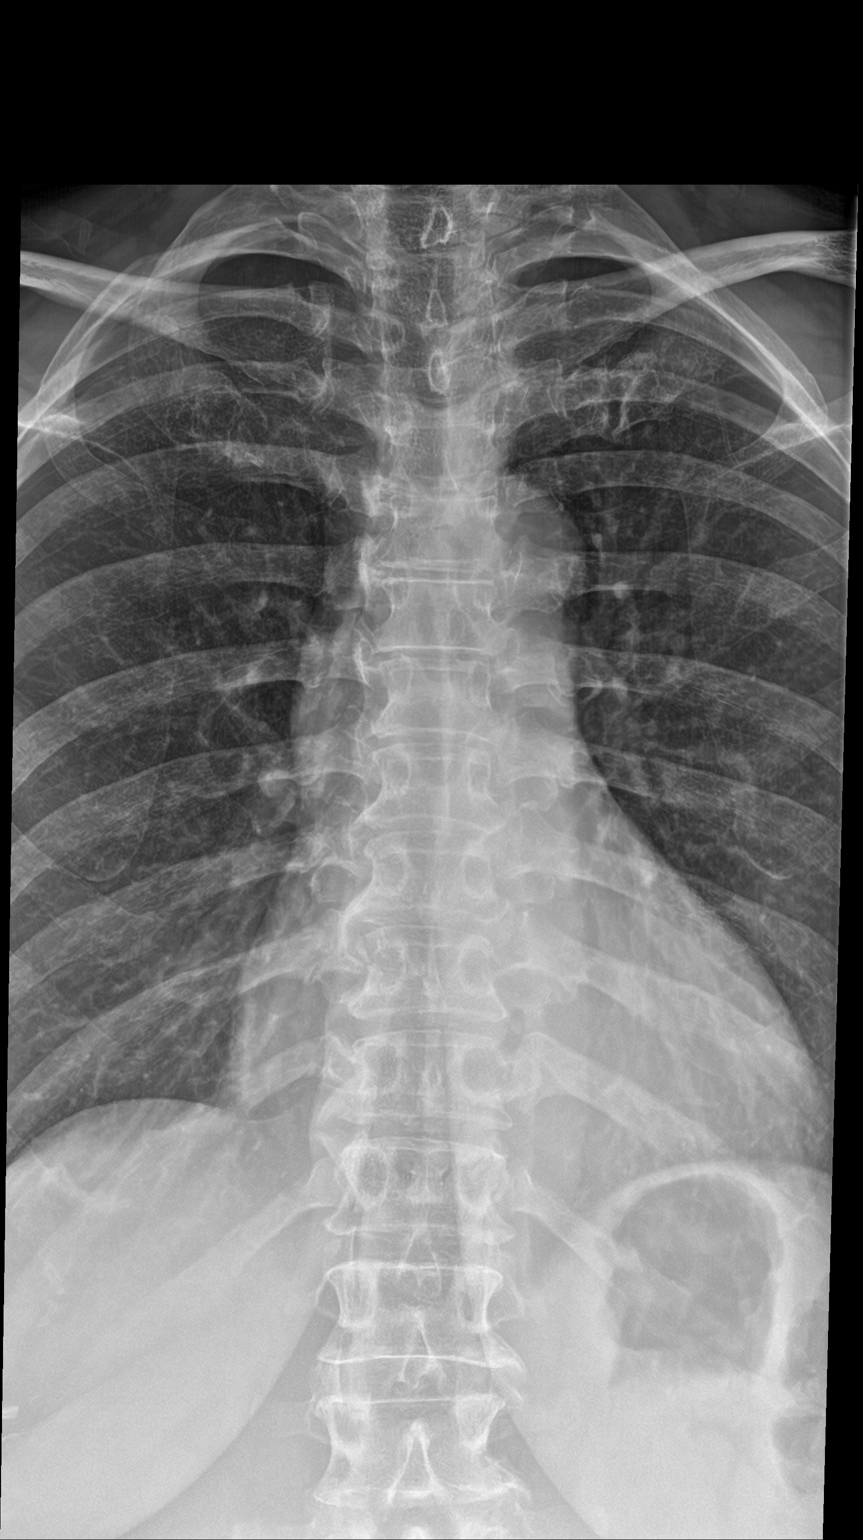

[t-spine lat]
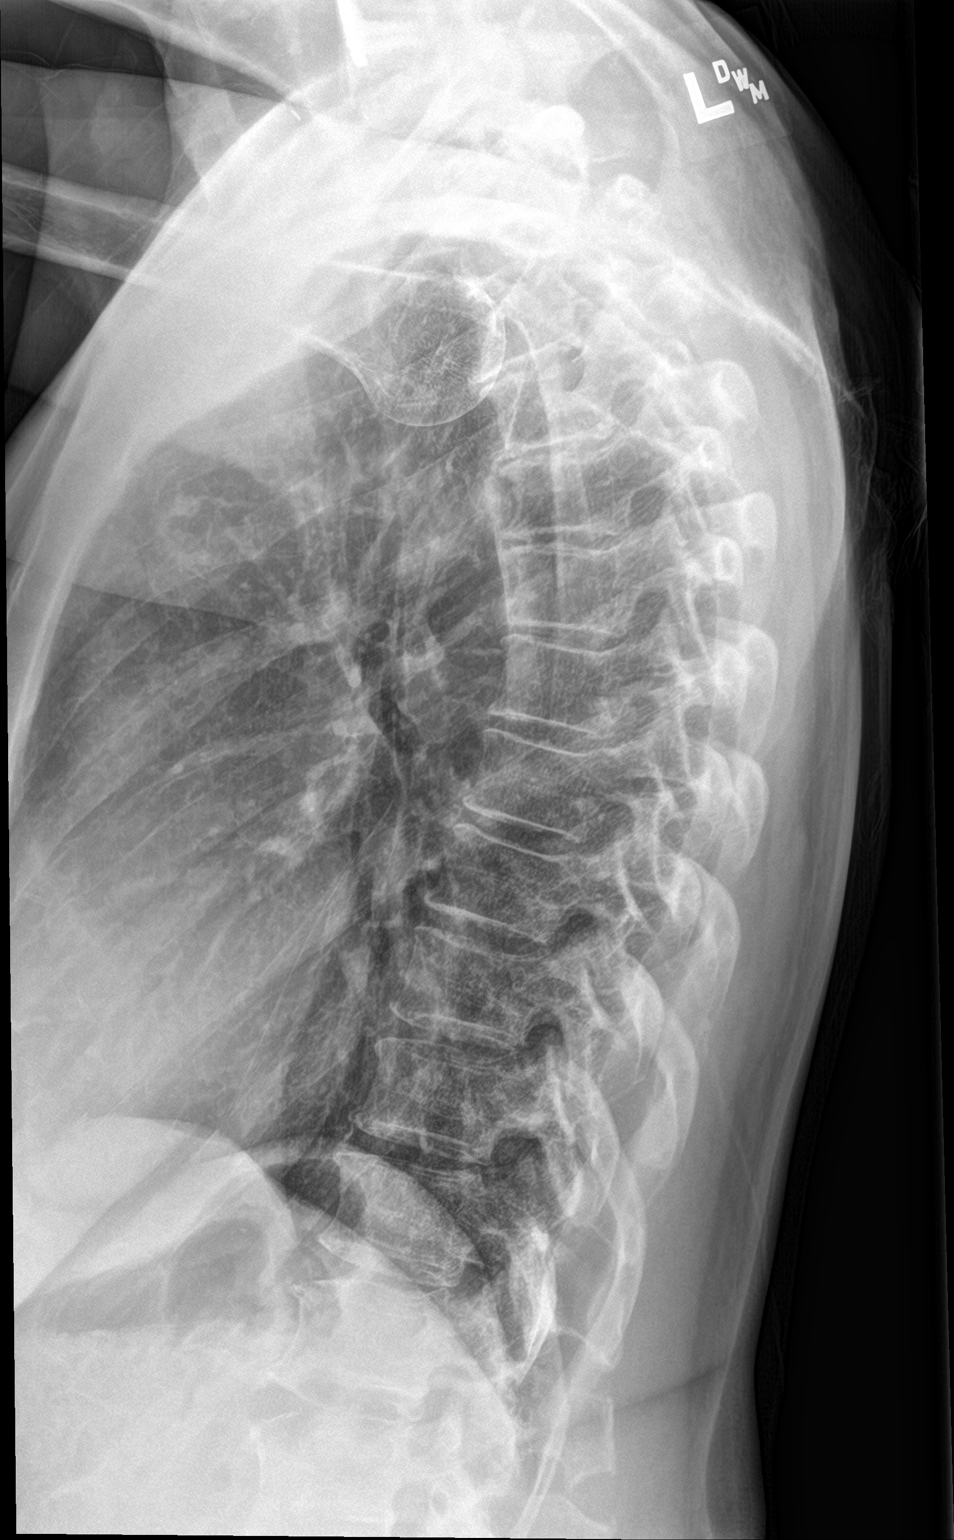

[t-spine swimmers]
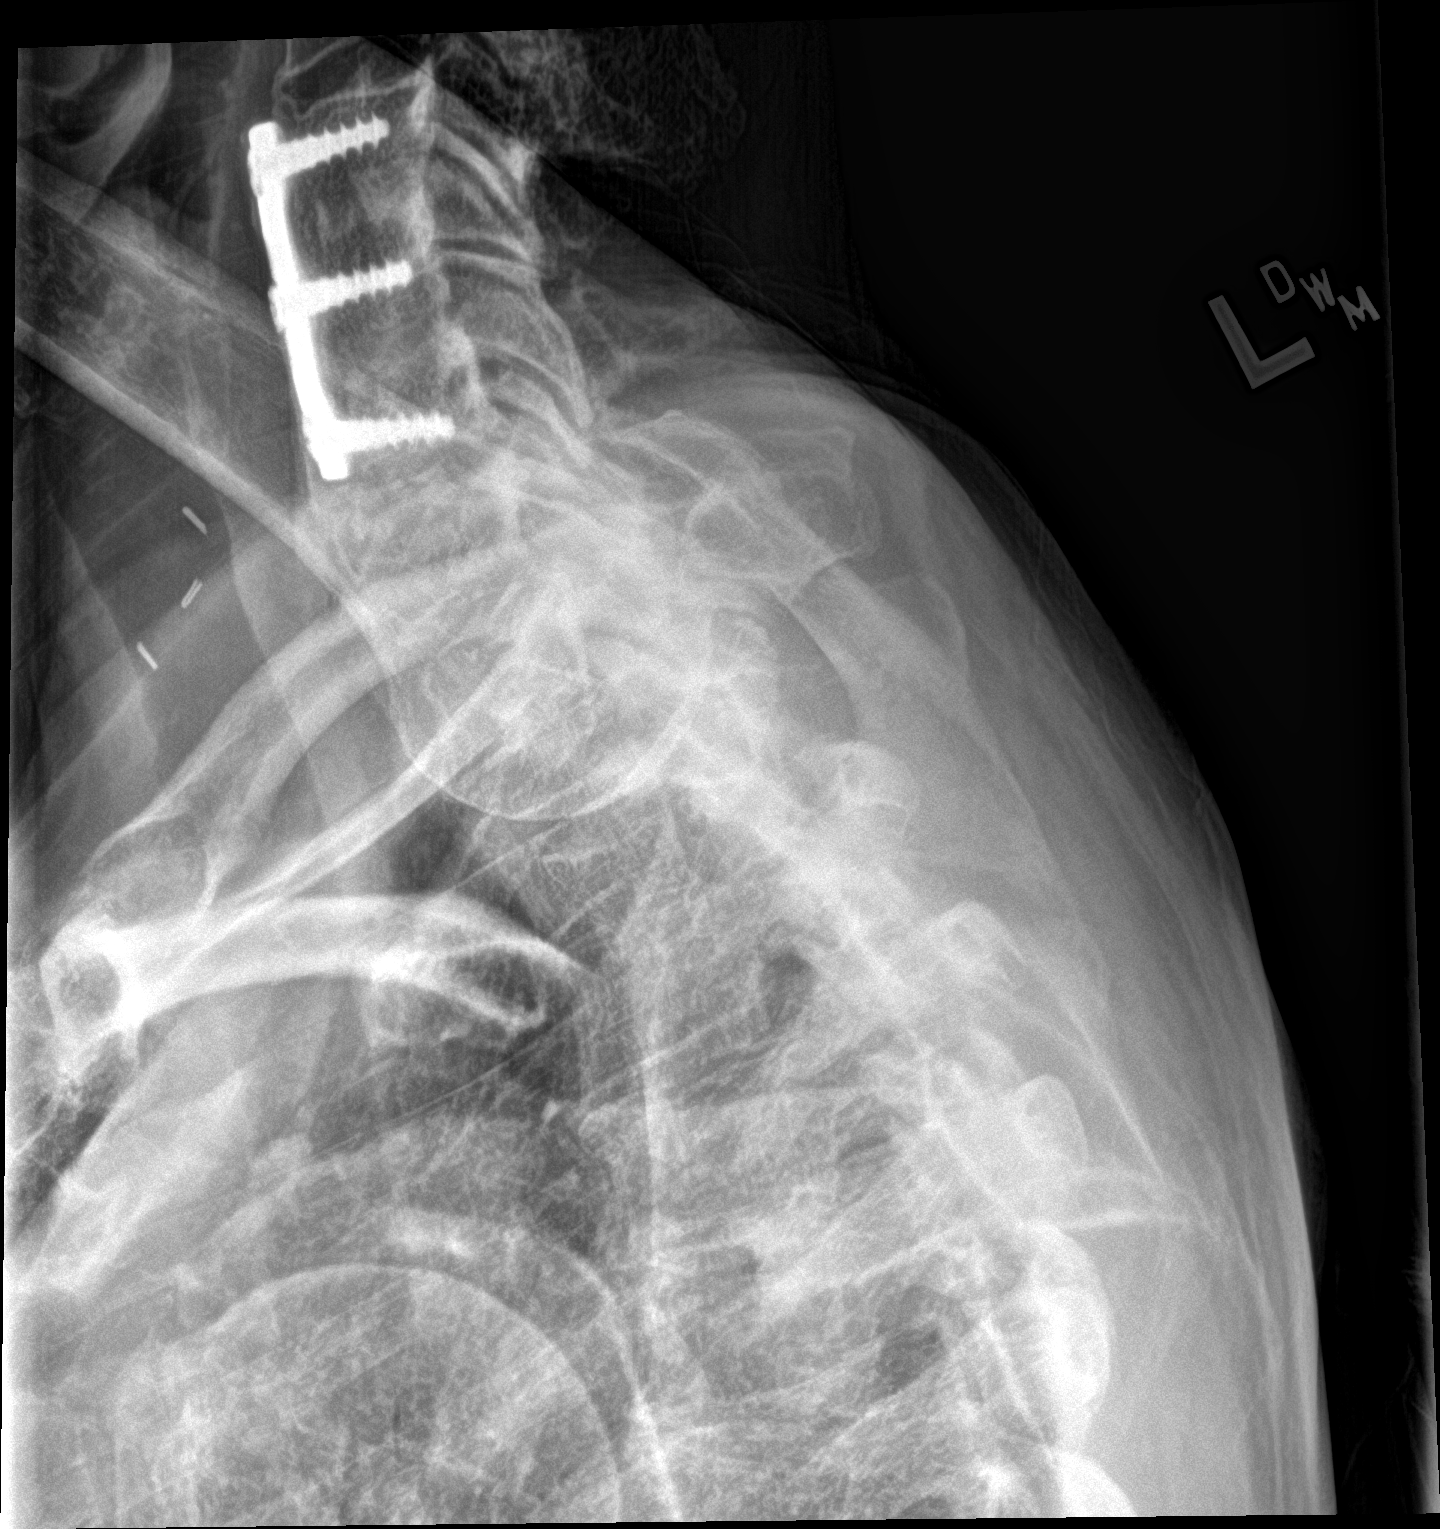

[3 of 3 positions shown; findings below may reference images not displayed]

FINDINGS: There is no evidence of thoracic spine fracture. Alignment is
normal. No other significant bone abnormalities are identified.
IMPRESSION: No significant abnormality seen in the thoracic spine.

## 2019-04-29 ENCOUNTER — Telehealth: Payer: Self-pay | Admitting: Family Medicine

## 2019-04-29 DIAGNOSIS — R41840 Attention and concentration deficit: Secondary | ICD-10-CM

## 2019-04-29 DIAGNOSIS — G8929 Other chronic pain: Secondary | ICD-10-CM

## 2019-04-29 NOTE — Telephone Encounter (Signed)
Medication Refill - Medication:  metoprolol succinate (TOPROL-XL) 50 MG 24 hr tablet gabapentin (NEURONTIN) 600 MG tablet azelastine (ASTELIN) 0.1 % nasal spray atomoxetine (STRATTERA) 100 MG capsule    Has the patient contacted their pharmacy? yes (Agent: If no, request that the patient contact the pharmacy for the refill.) (Agent: If yes, when and what did the pharmacy advise?) prescription to old  Preferred Pharmacy (with phone number or street name): [facility]  Agent: Please be advised that RX refills may take up to 3 business days. We ask that you follow-up with your pharmacy.

## 2019-05-03 MED ORDER — AZELASTINE HCL 0.1 % NA SOLN: 2.0000 | Freq: Two times a day (BID) | NASAL | 5 refills | Status: DC

## 2019-05-03 MED ORDER — METOPROLOL SUCCINATE ER 50 MG PO TB24: 50.0000 mg | ORAL_TABLET | Freq: Two times a day (BID) | ORAL | 1 refills | Status: DC

## 2019-05-03 MED ORDER — ATOMOXETINE HCL 100 MG PO CAPS: 100.0000 mg | ORAL_CAPSULE | Freq: Every day | ORAL | 0 refills | Status: DC

## 2019-05-03 MED ORDER — GABAPENTIN 600 MG PO TABS: 600.0000 mg | ORAL_TABLET | Freq: Three times a day (TID) | ORAL | 1 refills | Status: DC

## 2019-05-03 NOTE — Telephone Encounter (Signed)
Refill request for Strattera. Lowne Pt.   Last OV: 02/03/2019 Last Fill; 09/07/2018 #90 and 1RF UDS: 08/29/2015 Low risk

## 2019-05-03 NOTE — Telephone Encounter (Signed)
Sent!

## 2019-05-17 ENCOUNTER — Other Ambulatory Visit: Payer: Self-pay

## 2019-05-17 DIAGNOSIS — Z20822 Contact with and (suspected) exposure to covid-19: Secondary | ICD-10-CM

## 2019-05-20 LAB — NOVEL CORONAVIRUS, NAA: SARS-CoV-2, NAA: NOT DETECTED

## 2019-05-25 ENCOUNTER — Ambulatory Visit (INDEPENDENT_AMBULATORY_CARE_PROVIDER_SITE_OTHER): Payer: BLUE CROSS/BLUE SHIELD | Admitting: Family Medicine

## 2019-05-25 ENCOUNTER — Encounter: Payer: Self-pay | Admitting: Family Medicine

## 2019-05-25 ENCOUNTER — Ambulatory Visit (HOSPITAL_BASED_OUTPATIENT_CLINIC_OR_DEPARTMENT_OTHER)
Admission: RE | Admit: 2019-05-25 | Discharge: 2019-05-25 | Disposition: A | Discharge status: Home / Self Care | Payer: BLUE CROSS/BLUE SHIELD | Source: Ambulatory Visit | Attending: Family Medicine | Admitting: Family Medicine

## 2019-05-25 ENCOUNTER — Other Ambulatory Visit: Payer: Self-pay

## 2019-05-25 DIAGNOSIS — E049 Nontoxic goiter, unspecified: Secondary | ICD-10-CM

## 2019-05-25 DIAGNOSIS — R41840 Attention and concentration deficit: Secondary | ICD-10-CM | POA: Diagnosis not present

## 2019-05-25 DIAGNOSIS — R059 Cough, unspecified: Secondary | ICD-10-CM

## 2019-05-25 DIAGNOSIS — R05 Cough: Secondary | ICD-10-CM

## 2019-05-25 DIAGNOSIS — I10 Essential (primary) hypertension: Secondary | ICD-10-CM | POA: Diagnosis not present

## 2019-05-25 DIAGNOSIS — Z720 Tobacco use: Secondary | ICD-10-CM

## 2019-05-25 MED ORDER — CHANTIX STARTING MONTH PAK 0.5 MG X 11 & 1 MG X 42 PO TABS: ORAL_TABLET | ORAL | 0 refills | Status: DC

## 2019-05-25 MED ORDER — ATOMOXETINE HCL 100 MG PO CAPS: 100.0000 mg | ORAL_CAPSULE | Freq: Every day | ORAL | 0 refills | Status: DC

## 2019-05-25 NOTE — Progress Notes (Signed)
Virtual Visit via Video Note  I connected with Debra [name] on 05/25/19 at 11:15 AM EDT by a video enabled telemedicine application and verified that I am speaking with the correct person using two identifiers.  Location: Patient: home Provider: office   I discussed the limitations of evaluation and management by telemedicine and the availability of in person appointments. The patient expressed understanding and agreed to proceed.  History of Present Illness: Pt is home and c/o fatigue, eye d/c and cough x few weeks   Pt also c/o sore throat and chest pain.  She states she had   covid neg  Observations/Objective: No vitals obtained   Assessment and Plan: 1. Attention deficit Stable Refill meds - atomoxetine (STRATTERA) 100 MG capsule; Take 1 capsule (100 mg total) by mouth daily.  Dispense: 90 capsule; Refill: 0  2. Goiter Check labs and US - Thyroid Panel With TSH - US SOFT TISSUE HEAD & NECK (NON-THYROID); Future  3. Essential hypertension Well controlled, no changes to meds. Encouraged heart healthy diet such as the DASH diet and exercise as tolerated.  - Lipid panel - Comprehensive metabolic panel  4. Cough +smoker Check cxr and labs  - CBC with Differential/Platelet - Comprehensive metabolic panel - DG Chest 2 View; Future  5. Tobacco abuse  - varenicline (CHANTIX STARTING MONTH PAK) 0.5 MG X 11 & 1 MG X 42 tablet; Take one 0.5 mg tablet by mouth once daily for 3 days, then increase to one 0.5 mg tablet twice daily for 4 days, then increase to one 1 mg tablet twice daily.  Dispense: 53 tablet; Refill: 0   Follow Up Instructions:    I discussed the assessment and treatment plan with the patient. The patient was provided an opportunity to ask questions and all were answered. The patient agreed with the plan and demonstrated an understanding of the instructions.   The patient was advised to call back or seek an in-person evaluation if the symptoms worsen or if  the condition fails to improve as anticipated.  I provided 25 minutes of non-face-to-face time during this encounter.   Ann Held, DO

## 2019-05-28 ENCOUNTER — Other Ambulatory Visit: Payer: Self-pay

## 2019-05-28 ENCOUNTER — Other Ambulatory Visit (INDEPENDENT_AMBULATORY_CARE_PROVIDER_SITE_OTHER): Payer: BLUE CROSS/BLUE SHIELD

## 2019-05-28 DIAGNOSIS — I10 Essential (primary) hypertension: Secondary | ICD-10-CM | POA: Diagnosis not present

## 2019-05-28 DIAGNOSIS — R05 Cough: Secondary | ICD-10-CM | POA: Diagnosis not present

## 2019-05-28 DIAGNOSIS — E049 Nontoxic goiter, unspecified: Secondary | ICD-10-CM

## 2019-05-28 DIAGNOSIS — R059 Cough, unspecified: Secondary | ICD-10-CM

## 2019-05-28 LAB — COMPREHENSIVE METABOLIC PANEL
ALT: 12 U/L (ref 0–35)
AST: 21 U/L (ref 0–37)
Albumin: 4.3 g/dL (ref 3.5–5.2)
Alkaline Phosphatase: 90 U/L (ref 39–117)
BUN: 11 mg/dL (ref 6–23)
CO2: 29 mEq/L (ref 19–32)
Calcium: 9.7 mg/dL (ref 8.4–10.5)
Chloride: 105 mEq/L (ref 96–112)
Creatinine, Ser: 0.69 mg/dL (ref 0.40–1.20)
GFR: 103.7 mL/min (ref >60.00)
Glucose, Bld: 93 mg/dL (ref 70–99)
Potassium: 4.6 mEq/L (ref 3.5–5.1)
Sodium: 140 mEq/L (ref 135–145)
Total Bilirubin: 0.6 mg/dL (ref 0.2–1.2)
Total Protein: 7.6 g/dL (ref 6.0–8.3)

## 2019-05-28 LAB — CBC WITH DIFFERENTIAL/PLATELET
Basophils Absolute: 0 10*3/uL (ref 0.0–0.1)
Basophils Relative: 0.3 % (ref 0.0–3.0)
Eosinophils Absolute: 0.1 10*3/uL (ref 0.0–0.7)
Eosinophils Relative: 1.4 % (ref 0.0–5.0)
HCT: 35.5 % — ABNORMAL LOW (ref 36.0–46.0)
Hemoglobin: 11.8 g/dL — ABNORMAL LOW (ref 12.0–15.0)
Lymphocytes Relative: 30.4 % (ref 12.0–46.0)
Lymphs Abs: 2.1 10*3/uL (ref 0.7–4.0)
MCHC: 33.2 g/dL (ref 30.0–36.0)
MCV: 84.6 fl (ref 78.0–100.0)
Monocytes Absolute: 0.6 10*3/uL (ref 0.1–1.0)
Monocytes Relative: 8.2 % (ref 3.0–12.0)
Neutro Abs: 4.1 10*3/uL (ref 1.4–7.7)
Neutrophils Relative %: 59.7 % (ref 43.0–77.0)
Platelets: 283 10*3/uL (ref 150.0–400.0)
RBC: 4.19 Mil/uL (ref 3.87–5.11)
RDW: 14.4 % (ref 11.5–15.5)
WBC: 6.8 10*3/uL (ref 4.0–10.5)

## 2019-05-28 LAB — LIPID PANEL
Cholesterol: 178 mg/dL (ref 0–200)
HDL: 56.5 mg/dL (ref >39.00)
LDL Cholesterol: 105 mg/dL — ABNORMAL HIGH (ref 0–99)
NonHDL: 121.62
Total CHOL/HDL Ratio: 3
Triglycerides: 85 mg/dL (ref 0.0–149.0)
VLDL: 17 mg/dL (ref 0.0–40.0)

## 2019-05-28 NOTE — Addendum Note (Signed)
Addended by: Kelle Darting A on: 05/28/2019 01:26 PM   Modules accepted: Orders

## 2019-05-29 LAB — THYROID PANEL WITH TSH
Free Thyroxine Index: 1.1 — ABNORMAL LOW (ref 1.4–3.8)
T3 Uptake: 45 % — ABNORMAL HIGH (ref 22–35)
T4, Total: 2.5 ug/dL — ABNORMAL LOW (ref 5.1–11.9)
TSH: 1.9 mIU/L (ref 0.40–4.50)

## 2019-05-30 ENCOUNTER — Other Ambulatory Visit: Payer: Self-pay | Admitting: Family Medicine

## 2019-05-30 DIAGNOSIS — R7989 Other specified abnormal findings of blood chemistry: Secondary | ICD-10-CM

## 2019-06-07 ENCOUNTER — Encounter: Payer: Self-pay | Admitting: Gastroenterology

## 2019-06-08 ENCOUNTER — Ambulatory Visit (HOSPITAL_BASED_OUTPATIENT_CLINIC_OR_DEPARTMENT_OTHER)
Admission: RE | Admit: 2019-06-08 | Discharge: 2019-06-08 | Disposition: A | Discharge status: Home / Self Care | Payer: BLUE CROSS/BLUE SHIELD | Source: Ambulatory Visit | Attending: Family Medicine | Admitting: Family Medicine

## 2019-06-08 ENCOUNTER — Other Ambulatory Visit: Payer: Self-pay

## 2019-06-08 DIAGNOSIS — E049 Nontoxic goiter, unspecified: Secondary | ICD-10-CM | POA: Diagnosis not present

## 2019-08-03 ENCOUNTER — Other Ambulatory Visit: Payer: Self-pay | Admitting: *Deleted

## 2019-08-03 DIAGNOSIS — F17200 Nicotine dependence, unspecified, uncomplicated: Secondary | ICD-10-CM

## 2019-08-03 DIAGNOSIS — E785 Hyperlipidemia, unspecified: Secondary | ICD-10-CM

## 2019-08-03 MED ORDER — PRAVASTATIN SODIUM 20 MG PO TABS: 20.0000 mg | ORAL_TABLET | Freq: Every evening | ORAL | 1 refills | Status: DC

## 2019-08-03 MED ORDER — VARENICLINE TARTRATE 1 MG PO TABS: 1.0000 mg | ORAL_TABLET | Freq: Two times a day (BID) | ORAL | 2 refills | Status: DC

## 2019-11-16 ENCOUNTER — Encounter: Payer: Self-pay | Admitting: Family Medicine

## 2019-11-16 ENCOUNTER — Other Ambulatory Visit: Payer: Self-pay

## 2019-11-16 ENCOUNTER — Ambulatory Visit (INDEPENDENT_AMBULATORY_CARE_PROVIDER_SITE_OTHER): Payer: 59 | Admitting: Family Medicine

## 2019-11-16 VITALS — BP 124/90 | HR 86 | Temp 97.6°F | Resp 18 | Ht 63.0 in | Wt 179.6 lb

## 2019-11-16 DIAGNOSIS — E039 Hypothyroidism, unspecified: Secondary | ICD-10-CM

## 2019-11-16 DIAGNOSIS — R41840 Attention and concentration deficit: Secondary | ICD-10-CM | POA: Diagnosis not present

## 2019-11-16 DIAGNOSIS — F411 Generalized anxiety disorder: Secondary | ICD-10-CM

## 2019-11-16 DIAGNOSIS — E2839 Other primary ovarian failure: Secondary | ICD-10-CM

## 2019-11-16 DIAGNOSIS — R7309 Other abnormal glucose: Secondary | ICD-10-CM

## 2019-11-16 DIAGNOSIS — I700 Atherosclerosis of aorta: Secondary | ICD-10-CM

## 2019-11-16 DIAGNOSIS — I10 Essential (primary) hypertension: Secondary | ICD-10-CM | POA: Diagnosis not present

## 2019-11-16 DIAGNOSIS — L819 Disorder of pigmentation, unspecified: Secondary | ICD-10-CM

## 2019-11-16 DIAGNOSIS — E785 Hyperlipidemia, unspecified: Secondary | ICD-10-CM

## 2019-11-16 DIAGNOSIS — Z1211 Encounter for screening for malignant neoplasm of colon: Secondary | ICD-10-CM

## 2019-11-16 DIAGNOSIS — Z720 Tobacco use: Secondary | ICD-10-CM

## 2019-11-16 LAB — CBC WITH DIFFERENTIAL/PLATELET
Basophils Absolute: 0 10*3/uL (ref 0.0–0.1)
Basophils Relative: 0.3 % (ref 0.0–3.0)
Eosinophils Absolute: 0 10*3/uL (ref 0.0–0.7)
Eosinophils Relative: 0.8 % (ref 0.0–5.0)
HCT: 36.4 % (ref 36.0–46.0)
Hemoglobin: 12.1 g/dL (ref 12.0–15.0)
Lymphocytes Relative: 34.4 % (ref 12.0–46.0)
Lymphs Abs: 1.9 10*3/uL (ref 0.7–4.0)
MCHC: 33.2 g/dL (ref 30.0–36.0)
MCV: 84.8 fl (ref 78.0–100.0)
Monocytes Absolute: 0.4 10*3/uL (ref 0.1–1.0)
Monocytes Relative: 8 % (ref 3.0–12.0)
Neutro Abs: 3.1 10*3/uL (ref 1.4–7.7)
Neutrophils Relative %: 56.5 % (ref 43.0–77.0)
Platelets: 284 10*3/uL (ref 150.0–400.0)
RBC: 4.3 Mil/uL (ref 3.87–5.11)
RDW: 13.7 % (ref 11.5–15.5)
WBC: 5.5 10*3/uL (ref 4.0–10.5)

## 2019-11-16 LAB — COMPREHENSIVE METABOLIC PANEL
ALT: 11 U/L (ref 0–35)
AST: 18 U/L (ref 0–37)
Albumin: 4.2 g/dL (ref 3.5–5.2)
Alkaline Phosphatase: 88 U/L (ref 39–117)
BUN: 11 mg/dL (ref 6–23)
CO2: 31 mEq/L (ref 19–32)
Calcium: 9.9 mg/dL (ref 8.4–10.5)
Chloride: 103 mEq/L (ref 96–112)
Creatinine, Ser: 0.74 mg/dL (ref 0.40–1.20)
GFR: 95.51 mL/min (ref >60.00)
Glucose, Bld: 95 mg/dL (ref 70–99)
Potassium: 4.6 mEq/L (ref 3.5–5.1)
Sodium: 140 mEq/L (ref 135–145)
Total Bilirubin: 0.5 mg/dL (ref 0.2–1.2)
Total Protein: 7.8 g/dL (ref 6.0–8.3)

## 2019-11-16 LAB — HEMOGLOBIN A1C: Hgb A1c MFr Bld: 6.7 % — ABNORMAL HIGH (ref 4.6–6.5)

## 2019-11-16 LAB — LIPID PANEL
Cholesterol: 195 mg/dL (ref 0–200)
HDL: 54.7 mg/dL (ref >39.00)
LDL Cholesterol: 112 mg/dL — ABNORMAL HIGH (ref 0–99)
NonHDL: 140.45
Total CHOL/HDL Ratio: 4
Triglycerides: 141 mg/dL (ref 0.0–149.0)
VLDL: 28.2 mg/dL (ref 0.0–40.0)

## 2019-11-16 LAB — TSH: TSH: 0.6 u[IU]/mL (ref 0.35–4.50)

## 2019-11-16 MED ORDER — ADAPALENE 0.1 % EX GEL: Freq: Every day | CUTANEOUS | 0 refills | Status: DC

## 2019-11-16 MED ORDER — LOSARTAN POTASSIUM 100 MG PO TABS: 100.0000 mg | ORAL_TABLET | Freq: Every day | ORAL | 3 refills | Status: DC

## 2019-11-16 MED ORDER — ATOMOXETINE HCL 100 MG PO CAPS: 100.0000 mg | ORAL_CAPSULE | Freq: Every day | ORAL | 0 refills | Status: DC

## 2019-11-16 NOTE — Assessment & Plan Note (Signed)
Check labs  Watch starches and simple sugars

## 2019-11-16 NOTE — Assessment & Plan Note (Signed)
Tolerating statin, encouraged heart healthy diet, avoid trans fats, minimize simple carbs and saturated fats. Increase exercise as tolerated 

## 2019-11-16 NOTE — Assessment & Plan Note (Signed)
con't meds 

## 2019-11-16 NOTE — Assessment & Plan Note (Signed)
With covid she has been very anxious  She is still con't to try to cut down

## 2019-11-16 NOTE — Assessment & Plan Note (Signed)
Check labs con't meds 

## 2019-11-16 NOTE — Progress Notes (Signed)
Patient ID: Debra [name], female    DOB: 1955/11/13  Age: 64 y.o. MRN: [id number]    Subjective:  Subjective  HPI Debra [name] presents for f/u and med refills No new complaints  Review of Systems  Constitutional: Negative for appetite change, diaphoresis, fatigue and unexpected weight change.  Eyes: Negative for pain, redness and visual disturbance.  Respiratory: Negative for cough, chest tightness, shortness of breath and wheezing.   Cardiovascular: Negative for chest pain, palpitations and leg swelling.  Endocrine: Negative for cold intolerance, heat intolerance, polydipsia, polyphagia and polyuria.  Genitourinary: Negative for difficulty urinating, dysuria and frequency.  Neurological: Negative for dizziness, light-headedness, numbness and headaches.    History Past Medical History:  Diagnosis Date  . Anemia   . Anxiety   . Back pain   . Chest pain   . Constipation   . DDD (degenerative disc disease)   . Depression   . Diabetes ([place])   . Epilepsy ([place])   . GERD (gastroesophageal reflux disease)   . HLD (hyperlipidemia)   . HTN (hypertension)   . Joint pain   . Leg edema   . 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 Osteoarthritis   . Sleep apnea   . Stomach ulcer   . Swallowing difficulty   . Thyroid disease    Hypothyroidism  . Vitamin D deficiency     She has a past surgical history that includes Carpal tunnel release; Leg Surgery; Cervical spine surgery; and Wrist fracture surgery (10-02-15).   Her family history includes Anxiety disorder in her mother; Arthritis in her mother; Breast cancer in her mother; Cancer in her mother; Colon cancer in her maternal grandfather; Depression in her mother; Diabetes in her sister; Heart disease in her father; Heart disease (age of onset: 105) in her mother; Hyperlipidemia in her mother and sister; Hypertension in her father and sister; Stroke in her father; Thyroid disease in her mother.She reports that  she has been smoking cigarettes. She has a 33.00 pack-year smoking history. She has never used smokeless tobacco. She reports current alcohol use. She reports that she does not use drugs.  Current Outpatient Medications on File Prior to Visit  Medication Sig Dispense Refill  . azelastine (ASTELIN) 0.1 % nasal spray Place 2 sprays into both nostrils 2 (two) times daily. Use in each nostril as directed 30 mL 5  . ferrous sulfate (SLOW FE) 160 (50 Fe) MG TBCR SR tablet Take 1 tablet (160 mg total) by mouth daily. 90 each 3  . gabapentin (NEURONTIN) 600 MG tablet Take 1 tablet (600 mg total) by mouth 3 (three) times daily. 270 tablet 1  . hydrochlorothiazide (HYDRODIURIL) 25 MG tablet 1/2 po qd 45 tablet 3  . levothyroxine (SYNTHROID) 50 MCG tablet Take 1 tablet (50 mcg total) by mouth daily. 90 tablet 3  . metFORMIN (GLUCOPHAGE) 500 MG tablet Take 1 tablet (500 mg total) by mouth daily with breakfast. 30 tablet 0  . metoprolol succinate (TOPROL-XL) 50 MG 24 hr tablet Take 1 tablet (50 mg total) by mouth 2 (two) times daily. 180 tablet 1  . montelukast (SINGULAIR) 10 MG tablet Take 1 tablet (10 mg total) by mouth at bedtime. 30 tablet 11  . NON FORMULARY [facility]  Scar Cream -  Verapamil 10%, Pentoxifylline 5% Apply 1-2 grams to affected area 3-4 times daily Qty. 120 gm 3 refills    . Omega-3 Fatty Acids (FISH OIL PO) Take 1 tablet by  mouth as needed. Reported on 03/12/2016    . ondansetron (ZOFRAN ODT) 4 MG disintegrating tablet Take 1 tablet (4 mg total) by mouth every 8 (eight) hours as needed for nausea or vomiting. 20 tablet 0  . PARoxetine (PAXIL) 30 MG tablet Take 1 tablet (30 mg total) by mouth daily. 90 tablet 1  . pravastatin (PRAVACHOL) 20 MG tablet Take 1 tablet (20 mg total) by mouth every evening. 90 tablet 1  . ranitidine (ZANTAC) 150 MG tablet TAKE 1 TABLET BY MOUTH TWICE DAILY 60 tablet 5  . varenicline (CHANTIX CONTINUING MONTH PAK) 1 MG tablet Take 1 tablet (1 mg total)  by mouth 2 (two) times daily. 60 tablet 2  . Vitamin D, Ergocalciferol, (DRISDOL) 1.25 MG (50000 UT) CAPS capsule Take 1 capsule (50,000 Units total) by mouth every 7 (seven) days. 4 capsule 0   No current facility-administered medications on file prior to visit.     Objective:  Objective  Physical Exam Vitals and nursing note reviewed.  Constitutional:      Appearance: She is well-developed.  HENT:     Head: Normocephalic and atraumatic.  Eyes:     Conjunctiva/sclera: Conjunctivae normal.  Neck:     Thyroid: No thyromegaly.     Vascular: No carotid bruit or JVD.  Cardiovascular:     Rate and Rhythm: Normal rate and regular rhythm.     Heart sounds: Normal heart sounds. No murmur.  Pulmonary:     Effort: Pulmonary effort is normal. No respiratory distress.     Breath sounds: Normal breath sounds. No wheezing or rales.  Chest:     Chest wall: No tenderness.  Musculoskeletal:     Cervical back: Normal range of motion and neck supple.  Neurological:     Mental Status: She is alert and oriented to person, place, and time.    BP 124/90 (BP Location: Left Arm, Patient Position: Sitting, Cuff Size: Normal)   Pulse 86   Temp 97.6 F (36.4 C) (Temporal)   Resp 18   Ht 5\' 3"  (1.6 m)   Wt 179 lb 9.6 oz (81.5 kg)   SpO2 99%   BMI 31.81 kg/m  Wt Readings from Last 3 Encounters:  11/16/19 179 lb 9.6 oz (81.5 kg)  09/21/18 176 lb (79.8 kg)  09/09/18 179 lb (81.2 kg)     Lab Results  Component Value Date   WBC 6.8 05/28/2019   HGB 11.8 (L) 05/28/2019   HCT 35.5 (L) 05/28/2019   PLT 283.0 05/28/2019   GLUCOSE 93 05/28/2019   CHOL 178 05/28/2019   TRIG 85.0 05/28/2019   HDL 56.50 05/28/2019   LDLDIRECT 126.3 06/06/2011   LDLCALC 105 (H) 05/28/2019   ALT 12 05/28/2019   AST 21 05/28/2019   NA 140 05/28/2019   K 4.6 05/28/2019   CL 105 05/28/2019   CREATININE 0.69 05/28/2019   BUN 11 05/28/2019   CO2 29 05/28/2019   TSH 1.90 05/28/2019   HGBA1C 6.5 02/24/2019    MICROALBUR 3.1 (H) 11/07/2014    US THYROID  Result Date: 06/09/2019 CLINICAL DATA:  Remote partial left hemithyroidectomy.  Goiter. EXAM: THYROID ULTRASOUND TECHNIQUE: Ultrasound examination of the thyroid gland and adjacent soft tissues was performed. COMPARISON:  11/11/2005 by report only FINDINGS: Parenchymal Echotexture: Mildly heterogenous Isthmus: 0.3 cm thickness Right lobe: 4.2 x 1.4 x 1.4 cm, previously 5.2 x 2 x 1.7 by report Left lobe: Surgically absent _________________________________________________________ Estimated total number of nodules >/= 1 cm: 1 Number  of spongiform nodules >/=  2 cm not described below (TR1): 0 Number of mixed cystic and solid nodules >/= 1.5 cm not described below (TR2): 0 _________________________________________________________ 0.5 cm hypoechoic nodule without calcifications, superior right; This nodule does NOT meet TI-RADS criteria for biopsy or dedicated follow-up. Nodule # 2: Location: Isthmus; right of midline Maximum size: 1 cm; Other 2 dimensions: 0.6 x 0.8 cm Composition: mixed cystic and solid (1) Echogenicity: isoechoic (1) Shape: not taller-than-wide (0) Margins: smooth (0) Echogenic foci: none (0) ACR TI-RADS total points: 2. ACR TI-RADS risk category: TR2 (2 points). ACR TI-RADS recommendations: This nodule does NOT meet TI-RADS criteria for biopsy or dedicated follow-up. _________________________________________________________ IMPRESSION: 1. No residual/recurrent tissue post left hemithyroidectomy. 2. Small right and isthmic lesions do not meet criteria for biopsy or dedicated imaging follow-up. The above is in keeping with the ACR TI-RADS recommendations - J Am Coll Radiol 2017;14:587-595. Electronically Signed   By: Lucrezia Europe M.D.   On: 06/09/2019 08:09     Assessment & Plan:  Plan  I am having [name] start on adapalene. I am also having her maintain her Omega-3 Fatty Acids (FISH OIL PO), NON FORMULARY, ferrous sulfate,  hydrochlorothiazide, Vitamin D (Ergocalciferol), metFORMIN, ranitidine, montelukast, ondansetron, PARoxetine, levothyroxine, metoprolol succinate, gabapentin, azelastine, varenicline, pravastatin, atomoxetine, and losartan.  Meds ordered this encounter  Medications  . atomoxetine (STRATTERA) 100 MG capsule    Sig: Take 1 capsule (100 mg total) by mouth daily.    Dispense:  90 capsule    Refill:  0  . losartan (COZAAR) 100 MG tablet    Sig: Take 1 tablet (100 mg total) by mouth daily.    Dispense:  90 tablet    Refill:  3  . adapalene (DIFFERIN) 0.1 % gel    Sig: Apply topically at bedtime.    Dispense:  45 g    Refill:  0    Problem List Items Addressed This Visit      Unprioritized   Anxiety state    con't meds       Aortic atherosclerosis (HCC)    Check labs  Aspirin daily      Relevant Medications   losartan (COZAAR) 100 MG tablet   Essential hypertension    Well controlled, no changes to meds. Encouraged heart healthy diet such as the DASH diet and exercise as tolerated.       Relevant Medications   losartan (COZAAR) 100 MG tablet   Other Relevant Orders   CBC with Differential/Platelet   Comprehensive metabolic panel   HYPERGLYCEMIA, FASTING    Check labs  Watch starches and simple sugars       Hyperlipidemia    Tolerating statin, encouraged heart healthy diet, avoid trans fats, minimize simple carbs and saturated fats. Increase exercise as tolerated      Relevant Medications   losartan (COZAAR) 100 MG tablet   Other Relevant Orders   Lipid panel   CBC with Differential/Platelet   Comprehensive metabolic panel   Hypothyroidism - Primary    Check labs  con't meds      Relevant Orders   TSH   CBC with Differential/Platelet   Morbid obesity (HCC)   Relevant Medications   atomoxetine (STRATTERA) 100 MG capsule   Other Relevant Orders   Hemoglobin A1c   CBC with Differential/Platelet   Tobacco abuse    With covid she has been very anxious  She  is still con't to try to cut down  Other Visit Diagnoses    Attention deficit       Relevant Medications   atomoxetine (STRATTERA) 100 MG capsule   Colon cancer screening       Relevant Orders   Ambulatory referral to Gastroenterology   Estrogen deficiency       Relevant Orders   DG Bone Density   Hyperpigmentation       Relevant Medications   adapalene (DIFFERIN) 0.1 % gel   Other Relevant Orders   Ambulatory referral to Dermatology      Follow-up: Return in about 6 months (around 05/15/2020), or if symptoms worsen or fail to improve, for annual exam, fasting.  Ann Held, DO

## 2019-11-16 NOTE — Assessment & Plan Note (Signed)
Check labs 

## 2019-11-16 NOTE — Patient Instructions (Addendum)
COVID-19 Vaccine Information can be found at: [email or web address] For questions related to vaccine distribution or appointments, please email [email or web address] or call [phone].       DASH Eating Plan DASH stands for "Dietary Approaches to Stop Hypertension." The DASH eating plan is a healthy eating plan that has been shown to reduce high blood pressure (hypertension). It may also reduce your risk for type 2 diabetes, heart disease, and stroke. The DASH eating plan may also help with weight loss. What are tips for following this plan?  General guidelines  Avoid eating more than 2,300 mg (milligrams) of salt (sodium) a day. If you have hypertension, you may need to reduce your sodium intake to 1,500 mg a day.  Limit alcohol intake to no more than 1 drink a day for nonpregnant women and 2 drinks a day for men. One drink equals 12 oz of beer, 5 oz of wine, or 1 oz of hard liquor.  Work with your health care provider to maintain a healthy body weight or to lose weight. Ask what an ideal weight is for you.  Get at least 30 minutes of exercise that causes your heart to beat faster (aerobic exercise) most days of the week. Activities may include walking, swimming, or biking.  Work with your health care provider or diet and nutrition specialist (dietitian) to adjust your eating plan to your individual calorie needs. Reading food labels   Check food labels for the amount of sodium per serving. Choose foods with less than 5 percent of the Daily Value of sodium. Generally, foods with less than 300 mg of sodium per serving fit into this eating plan.  To find whole grains, look for the word "whole" as the first word in the ingredient list. Shopping  Buy products labeled as "low-sodium" or "no salt added."  Buy fresh foods. Avoid canned foods and premade or frozen meals. Cooking  Avoid adding salt when cooking. Use salt-free  seasonings or herbs instead of table salt or sea salt. Check with your health care provider or pharmacist before using salt substitutes.  Do not fry foods. Cook foods using healthy methods such as baking, boiling, grilling, and broiling instead.  Cook with heart-healthy oils, such as olive, canola, soybean, or sunflower oil. Meal planning  Eat a balanced diet that includes: ? 5 or more servings of fruits and vegetables each day. At each meal, try to fill half of your plate with fruits and vegetables. ? Up to 6-8 servings of whole grains each day. ? Less than 6 oz of lean meat, poultry, or fish each day. A 3-oz serving of meat is about the same size as a deck of cards. One egg equals 1 oz. ? 2 servings of low-fat dairy each day. ? A serving of nuts, seeds, or beans 5 times each week. ? Heart-healthy fats. Healthy fats called Omega-3 fatty acids are found in foods such as flaxseeds and coldwater fish, like sardines, salmon, and mackerel.  Limit how much you eat of the following: ? Canned or prepackaged foods. ? Food that is high in trans fat, such as fried foods. ? Food that is high in saturated fat, such as fatty meat. ? Sweets, desserts, sugary drinks, and other foods with added sugar. ? Full-fat dairy products.  Do not salt foods before eating.  Try to eat at least 2 vegetarian meals each week.  Eat more home-cooked food and less restaurant, buffet, and fast food.  When eating at a restaurant, ask that your food  be prepared with less salt or no salt, if possible. What foods are recommended? The items listed may not be a complete list. Talk with your dietitian about what dietary choices are best for you. Grains Whole-grain or whole-wheat bread. Whole-grain or whole-wheat pasta. Brown rice. [place]. Bulgur. Whole-grain and low-sodium cereals. Pita bread. Low-fat, low-sodium crackers. Whole-wheat flour tortillas. Vegetables Fresh or frozen vegetables (raw, steamed, roasted, or  grilled). Low-sodium or reduced-sodium tomato and vegetable juice. Low-sodium or reduced-sodium tomato sauce and tomato paste. Low-sodium or reduced-sodium canned vegetables. Fruits All fresh, dried, or frozen fruit. Canned fruit in natural juice (without added sugar). Meat and other protein foods Skinless chicken or [place]. Ground chicken or [place]. Pork with fat trimmed off. Fish and seafood. Egg whites. Dried beans, peas, or lentils. Unsalted nuts, nut butters, and seeds. Unsalted canned beans. Lean cuts of beef with fat trimmed off. Low-sodium, lean deli meat. Dairy Low-fat (1%) or fat-free (skim) milk. Fat-free, low-fat, or reduced-fat cheeses. Nonfat, low-sodium ricotta or cottage cheese. Low-fat or nonfat yogurt. Low-fat, low-sodium cheese. Fats and oils Soft margarine without trans fats. Vegetable oil. Low-fat, reduced-fat, or light mayonnaise and salad dressings (reduced-sodium). Canola, safflower, olive, soybean, and sunflower oils. Avocado. Seasoning and other foods Herbs. Spices. Seasoning mixes without salt. Unsalted popcorn and pretzels. Fat-free sweets. What foods are not recommended? The items listed may not be a complete list. Talk with your dietitian about what dietary choices are best for you. Grains Baked goods made with fat, such as croissants, muffins, or some breads. Dry pasta or rice meal packs. Vegetables Creamed or fried vegetables. Vegetables in a cheese sauce. Regular canned vegetables (not low-sodium or reduced-sodium). Regular canned tomato sauce and paste (not low-sodium or reduced-sodium). Regular tomato and vegetable juice (not low-sodium or reduced-sodium). Angie Fava. Olives. Fruits Canned fruit in a light or heavy syrup. Fried fruit. Fruit in cream or butter sauce. Meat and other protein foods Fatty cuts of meat. Ribs. Fried meat. Berniece Salines. Sausage. Bologna and other processed lunch meats. Salami. Fatback. Hotdogs. Bratwurst. Salted nuts and seeds. Canned beans with  added salt. Canned or smoked fish. Whole eggs or egg yolks. Chicken or [place] with skin. Dairy Whole or 2% milk, cream, and half-and-half. Whole or full-fat cream cheese. Whole-fat or sweetened yogurt. Full-fat cheese. Nondairy creamers. Whipped toppings. Processed cheese and cheese spreads. Fats and oils Butter. Stick margarine. Lard. Shortening. Ghee. Bacon fat. Tropical oils, such as coconut, palm kernel, or palm oil. Seasoning and other foods Salted popcorn and pretzels. Onion salt, garlic salt, seasoned salt, table salt, and sea salt. Worcestershire sauce. Tartar sauce. Barbecue sauce. Teriyaki sauce. Soy sauce, including reduced-sodium. Steak sauce. Canned and packaged gravies. Fish sauce. Oyster sauce. Cocktail sauce. Horseradish that you find on the shelf. Ketchup. Mustard. Meat flavorings and tenderizers. Bouillon cubes. Hot sauce and Tabasco sauce. Premade or packaged marinades. Premade or packaged taco seasonings. Relishes. Regular salad dressings. Where to find more information:  National Heart, Lung, and [place]: https://wilson-eaton.com/  American Heart Association: www.heart.org Summary  The DASH eating plan is a healthy eating plan that has been shown to reduce high blood pressure (hypertension). It may also reduce your risk for type 2 diabetes, heart disease, and stroke.  With the DASH eating plan, you should limit salt (sodium) intake to 2,300 mg a day. If you have hypertension, you may need to reduce your sodium intake to 1,500 mg a day.  When on the DASH eating plan, aim to eat more fresh fruits and vegetables, whole  grains, lean proteins, low-fat dairy, and heart-healthy fats.  Work with your health care provider or diet and nutrition specialist (dietitian) to adjust your eating plan to your individual calorie needs. This information is not intended to replace advice given to you by your health care provider. Make sure you discuss any questions you have with your health care  provider. Document Revised: 09/26/2017 Document Reviewed: 10/07/2016 Elsevier Patient Education  2020 [employer].

## 2019-11-16 NOTE — Assessment & Plan Note (Signed)
Well controlled, no changes to meds. Encouraged heart healthy diet such as the DASH diet and exercise as tolerated.  °

## 2019-11-16 NOTE — Assessment & Plan Note (Signed)
Check labs  Aspirin daily

## 2019-11-17 ENCOUNTER — Other Ambulatory Visit: Payer: Self-pay | Admitting: Family Medicine

## 2019-11-17 DIAGNOSIS — E1169 Type 2 diabetes mellitus with other specified complication: Secondary | ICD-10-CM

## 2019-11-17 DIAGNOSIS — E1165 Type 2 diabetes mellitus with hyperglycemia: Secondary | ICD-10-CM

## 2019-11-17 MED ORDER — BLOOD GLUCOSE MONITOR KIT: PACK | 0 refills | Status: DC

## 2019-11-19 ENCOUNTER — Other Ambulatory Visit: Payer: Self-pay

## 2019-11-19 MED ORDER — ROSUVASTATIN CALCIUM 10 MG PO TABS: ORAL_TABLET | ORAL | 1 refills | Status: DC

## 2019-11-22 ENCOUNTER — Telehealth: Payer: Self-pay | Admitting: Family Medicine

## 2019-11-22 NOTE — Telephone Encounter (Signed)
Pt states there was a massage left for her in my chart and wanted to speak with a nurse about it.

## 2019-11-24 NOTE — Telephone Encounter (Signed)
I wanted her to try diet first That is why I wanted her to get DM education material , glucometer  We can also refer to diabetic nutrition if she would like

## 2019-11-24 NOTE — Telephone Encounter (Signed)
Spoke with patient. She was wondering if you were going to put her on any medication for the diabetes. Please advise

## 2019-11-25 ENCOUNTER — Other Ambulatory Visit: Payer: Self-pay

## 2019-11-25 ENCOUNTER — Ambulatory Visit: Payer: 59

## 2019-11-25 DIAGNOSIS — E119 Type 2 diabetes mellitus without complications: Secondary | ICD-10-CM

## 2019-11-25 NOTE — Progress Notes (Signed)
Patient was instructed on how to measure her glucose level with her glucometer. She was able to check her glucose w/o any complications.  Glucose today on her meter is at 130.  Patient did dont have any questions.  Advised per Dr. Jason Nest to work on her diet.

## 2019-11-25 NOTE — Patient Instructions (Addendum)
Check blood sugar levell. a few x a week at different times of the day. For example, you can check one day in the morning before eating, one day before lunch, another day 2 hours after lunch, and another day before bed time.   Please let us know if your level is ever over 200.   Work on [employer] limit sugar and carbohydrates.

## 2019-11-25 NOTE — Telephone Encounter (Signed)
Pt coming in for a nurse visit

## 2019-11-28 IMAGING — DX DG LUMBAR SPINE COMPLETE 4+V
5 series · 5 of 5 positions shown · non-contrast
Comparison: 04/17/2017

CLINICAL DATA: Lower back pain going down to the right hip. Right
sciatica.

EXAM:
LUMBAR SPINE - COMPLETE 4+ VIEW

[l-spine ap]
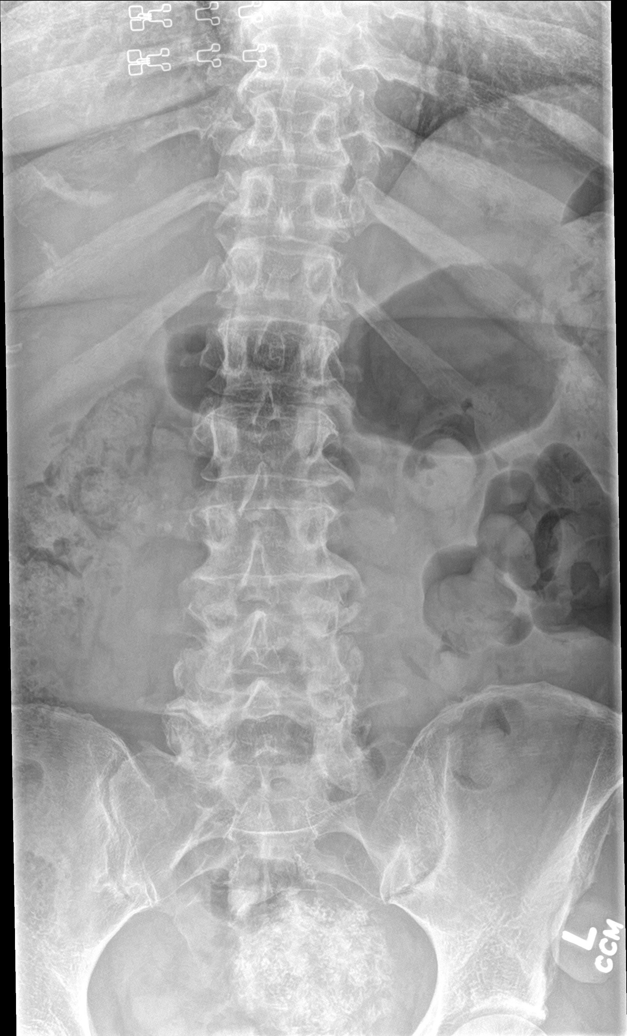

[l-spine obl (1 of 2)]
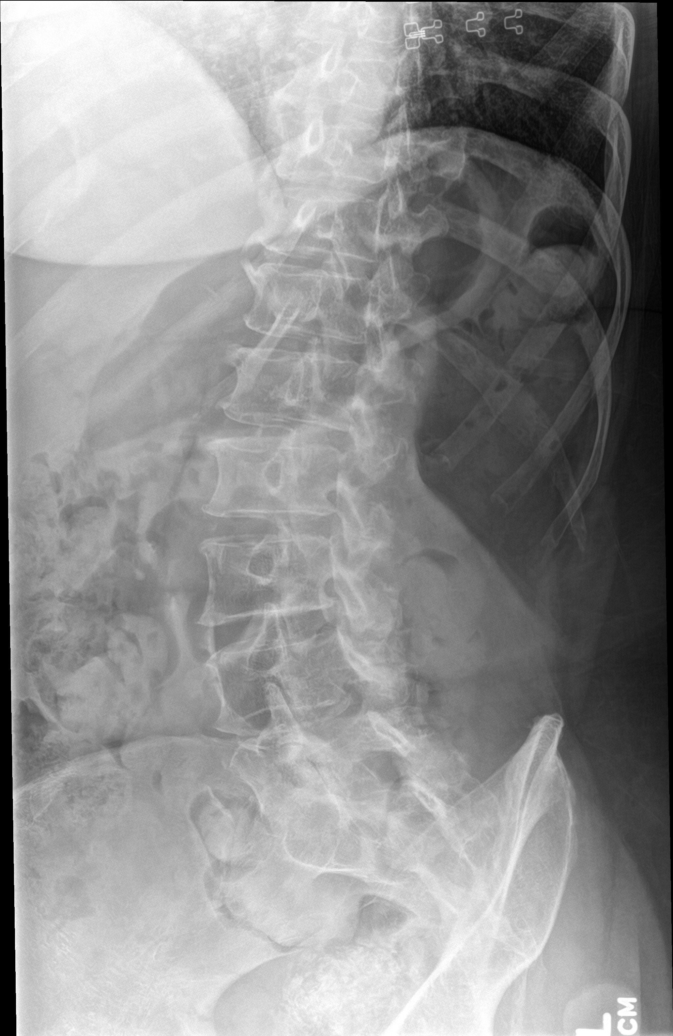

[l-spine obl (2 of 2)]
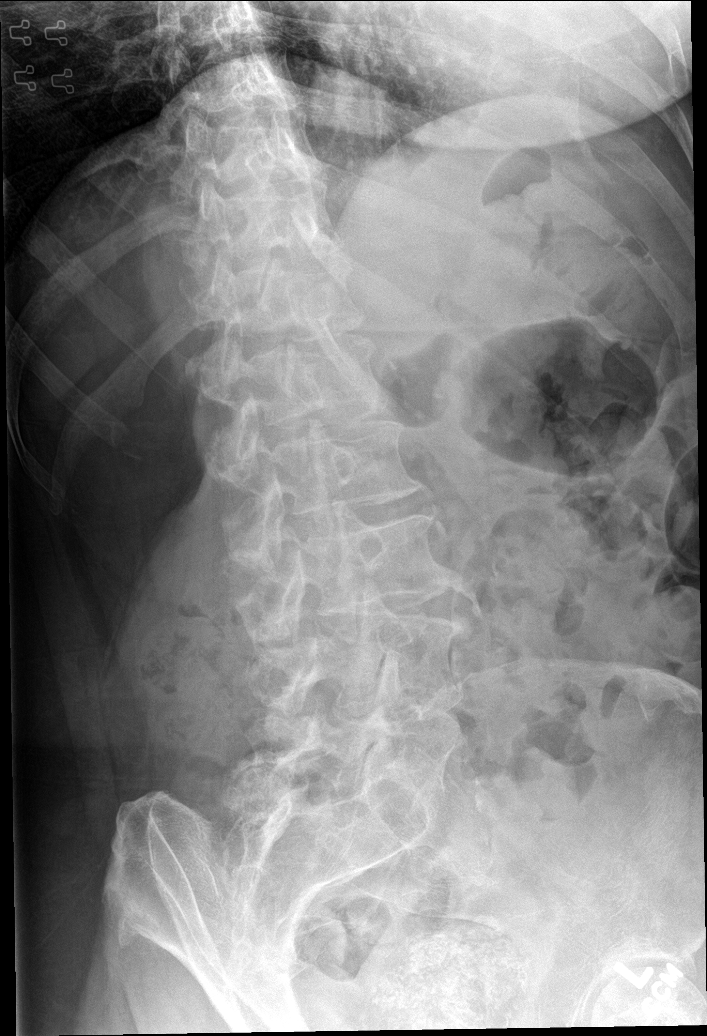

[l-spine lat]
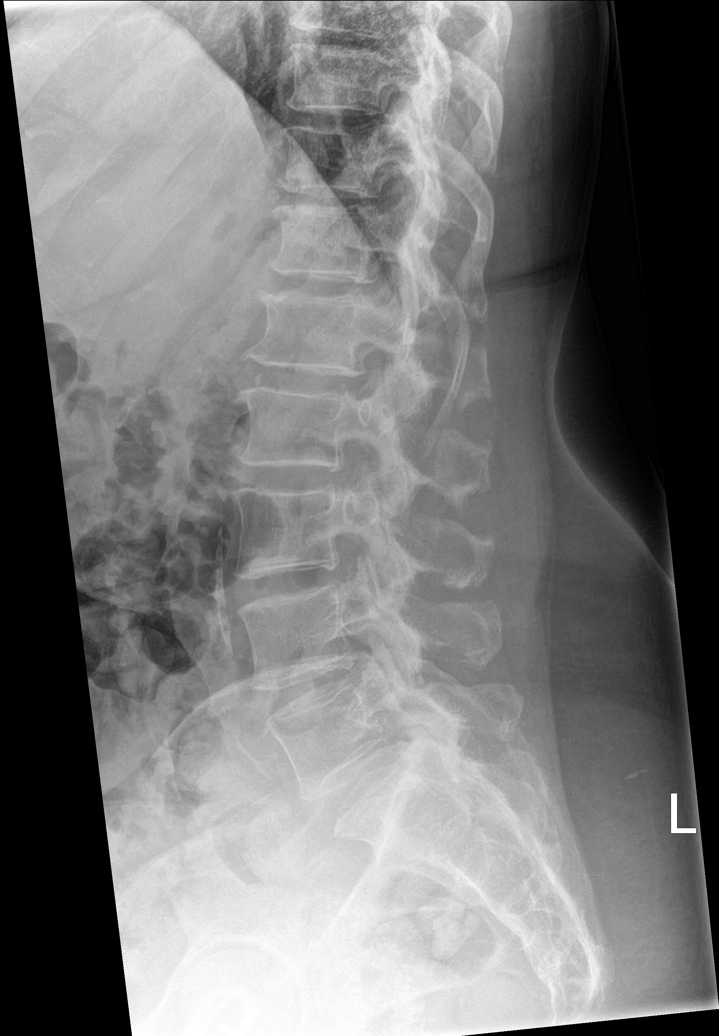

[l-spine spot]
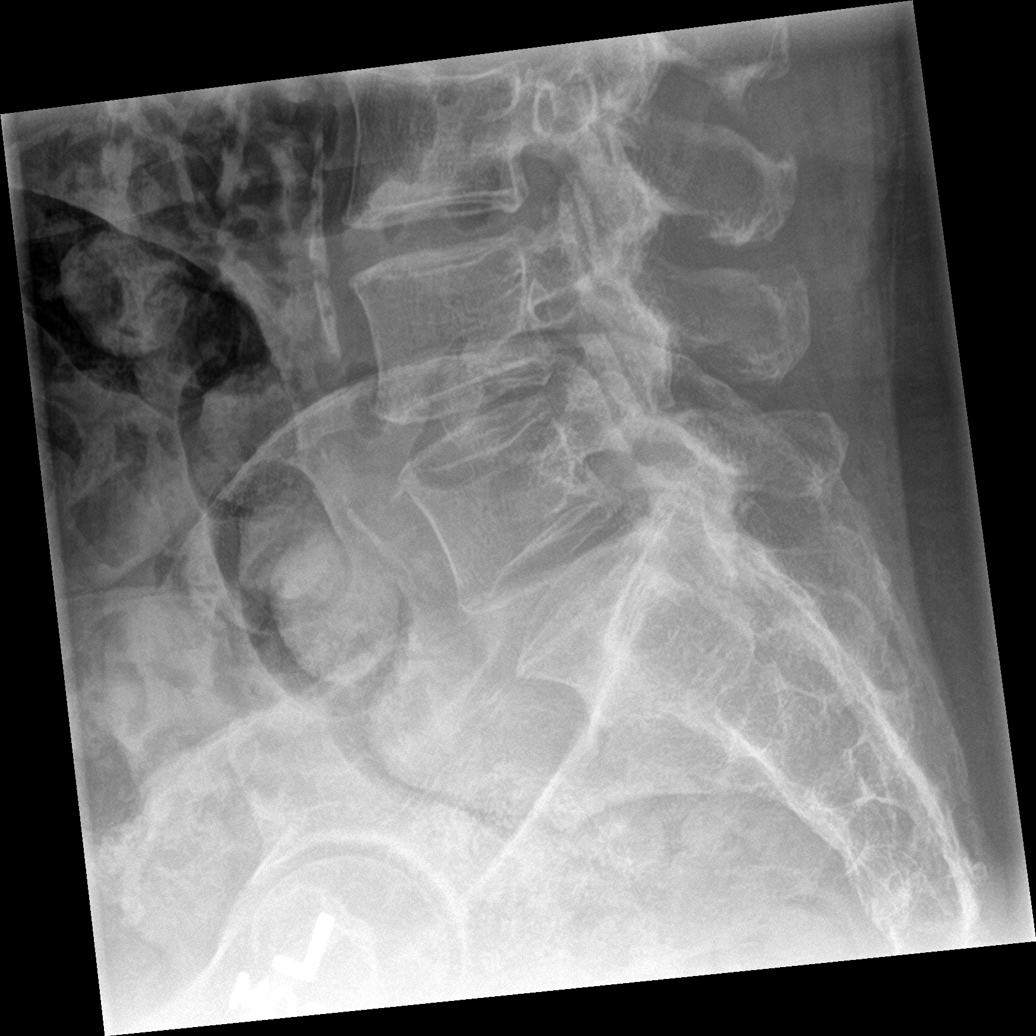

[5 of 5 positions shown; findings below may reference images not displayed]

FINDINGS: Normal alignment of the lumbar spine. Vertebral body heights and
disc spaces are maintained. Multilevel degenerative endplate changes
with bridging osteophytes. Atherosclerotic calcifications in the
abdominal aorta. Facet arthropathy particularly at L5-S1. No
evidence for a pars defect.
IMPRESSION: Stable appearance of lumbar spine with degenerative endplate
changes. No acute abnormality.

Aortic atherosclerosis.

## 2019-11-29 ENCOUNTER — Telehealth: Payer: Self-pay

## 2019-11-29 DIAGNOSIS — E119 Type 2 diabetes mellitus without complications: Secondary | ICD-10-CM

## 2019-11-29 NOTE — Telephone Encounter (Signed)
Patient called in to let Dr. Etter [name] know that her sugar levels has went up above 200. Patient would like to get a follow up call as soon as possible at [phone] thanks.

## 2019-12-01 MED ORDER — METFORMIN HCL 500 MG PO TABS: 500.0000 mg | ORAL_TABLET | Freq: Every day | ORAL | 2 refills | Status: DC

## 2019-12-01 NOTE — Telephone Encounter (Signed)
Increase metformin to 500 mg bid #60  2 refills -  refer to nutrition if pt agrees

## 2019-12-01 NOTE — Telephone Encounter (Signed)
Spoke with pt. Pt states she has not been taking Metformin. Spoke with [place] and was advised to send in Metformin once a day

## 2019-12-01 NOTE — Telephone Encounter (Signed)
Please advise 

## 2019-12-14 ENCOUNTER — Ambulatory Visit (INDEPENDENT_AMBULATORY_CARE_PROVIDER_SITE_OTHER): Payer: 59 | Admitting: Family Medicine

## 2019-12-14 ENCOUNTER — Encounter: Payer: Self-pay | Admitting: Family Medicine

## 2019-12-14 ENCOUNTER — Other Ambulatory Visit: Payer: Self-pay

## 2019-12-14 VITALS — Ht 63.0 in

## 2019-12-14 DIAGNOSIS — J069 Acute upper respiratory infection, unspecified: Secondary | ICD-10-CM | POA: Insufficient documentation

## 2019-12-14 DIAGNOSIS — M79602 Pain in left arm: Secondary | ICD-10-CM

## 2019-12-14 DIAGNOSIS — M79601 Pain in right arm: Secondary | ICD-10-CM | POA: Diagnosis not present

## 2019-12-14 DIAGNOSIS — R41840 Attention and concentration deficit: Secondary | ICD-10-CM | POA: Diagnosis not present

## 2019-12-14 DIAGNOSIS — G8929 Other chronic pain: Secondary | ICD-10-CM

## 2019-12-14 DIAGNOSIS — M5440 Lumbago with sciatica, unspecified side: Secondary | ICD-10-CM

## 2019-12-14 HISTORY — DX: Acute upper respiratory infection, unspecified: J06.9

## 2019-12-14 MED ORDER — MELOXICAM 7.5 MG PO TABS: 7.5000 mg | ORAL_TABLET | Freq: Every day | ORAL | 0 refills | Status: DC

## 2019-12-14 MED ORDER — ATOMOXETINE HCL 100 MG PO CAPS: 100.0000 mg | ORAL_CAPSULE | Freq: Every day | ORAL | 3 refills | Status: DC

## 2019-12-14 MED ORDER — TIZANIDINE HCL 4 MG PO CAPS: 4.0000 mg | ORAL_CAPSULE | Freq: Three times a day (TID) | ORAL | 0 refills | Status: DC

## 2019-12-14 NOTE — Progress Notes (Signed)
Virtual Visit via Video Note  I connected with Debra [name] on 12/14/19 at 10:40 AM EST by a video enabled telemedicine application and verified that I am speaking with the correct person using two identifiers.  Location: Patient: home alone Provider: office    I discussed the limitations of evaluation and management by telemedicine and the availability of in person appointments. The patient expressed understanding and agreed to proceed.  History of Present Illness: Pt c/o arm pain x 1 month---- it has improved   She has been lifting a lot of boxes / groceries etc around house and pain in low back and going down leg    It felt the same as when she had her back injury   Pt also c/o congestion in head and yellow / green mucus in nose  No fever + sinus pressure/ headache  Observations/Objective: No vitals obtained  Pt is in nad   Assessment and Plan: 1. Chronic bilateral low back pain with sciatica, sciatica laterality unspecified Muscle relaxer and mobic F/u in office if no better  - tiZANidine (ZANAFLEX) 4 MG capsule; Take 1 capsule (4 mg total) by mouth 3 (three) times daily.  Dispense: 30 capsule; Refill: 0 - meloxicam (MOBIC) 7.5 MG tablet; Take 1 tablet (7.5 mg total) by mouth daily.  Dispense: 30 tablet; Refill: 0  2. Pain in both upper extremities  - tiZANidine (ZANAFLEX) 4 MG capsule; Take 1 capsule (4 mg total) by mouth 3 (three) times daily.  Dispense: 30 capsule; Refill: 0 - meloxicam (MOBIC) 7.5 MG tablet; Take 1 tablet (7.5 mg total) by mouth daily.  Dispense: 30 tablet; Refill: 0  3. Attention deficit Stable--- refill meds  - atomoxetine (STRATTERA) 100 MG capsule; Take 1 capsule (100 mg total) by mouth daily.  Dispense: 90 capsule; Refill: 3  4. Upper respiratory tract infection, unspecified type Pt to be seen in resp clinic tonight   Follow Up Instructions:    I discussed the assessment and treatment plan with the patient. The patient was provided an  opportunity to ask questions and all were answered. The patient agreed with the plan and demonstrated an understanding of the instructions.   The patient was advised to call back or seek an in-person evaluation if the symptoms worsen or if the condition fails to improve as anticipated.  I provided 30 minutes of non-face-to-face time during this encounter.   Ann Held, DO

## 2019-12-17 ENCOUNTER — Ambulatory Visit: Payer: 59

## 2019-12-24 ENCOUNTER — Ambulatory Visit (INDEPENDENT_AMBULATORY_CARE_PROVIDER_SITE_OTHER): Payer: 59 | Admitting: Family Medicine

## 2019-12-24 ENCOUNTER — Other Ambulatory Visit: Payer: Self-pay

## 2019-12-24 VITALS — BP 145/80 | HR 100 | Temp 98.1°F | Resp 12 | Ht 63.0 in | Wt 182.0 lb

## 2019-12-24 DIAGNOSIS — R079 Chest pain, unspecified: Secondary | ICD-10-CM | POA: Diagnosis not present

## 2019-12-24 DIAGNOSIS — I10 Essential (primary) hypertension: Secondary | ICD-10-CM

## 2019-12-24 NOTE — Patient Instructions (Addendum)
A few things to remember from today's visit:   Chest pain, unspecified type - Plan: EKG 12-Lead, Ambulatory referral to Cardiology  Essential hypertension  Monitor blood pressure at home. Take you medications as prescribed.  No changes in current medications.  If chest pain please seek immediate medical attention.  Please be sure medication list is accurate. If a new problem present, please set up appointment sooner than planned today.

## 2019-12-24 NOTE — Progress Notes (Signed)
ACUTE VISIT   HPI:  Chief Complaint  Patient presents with  . Chest Pain    Debra [name] is a 64 y.o. female with hx of HTN,anxiety,HLD, and GERD who is here today complaining of chest pain and congestion last week. Symptoms have resolved. According to pt,she was instructed to be seen here today.  Last week middle upper chest pain, not sure it it was radiated to LUE. No associated dyspnea,palpitations,or diaphoresis. Not related with exertion or stress. No cough or wheezing.  She also had "little" pain of upper and lower extremities. Negative for fever,chills.sore throat,nasal congestion,rhinorrhea,anosmia,or ageustia. No sick contact or recent travel.  HTN,BP elevated today. She is not checking BP at home. She is on Losartan 100 mg daily and Metoprolol Succinate 50 mg bid.  She did not take Metoprolol today. Negative for severe/frequent headache, visual changes,claudication, focal weakness, or edema.  Heartburn "all the time." +Smoker.  DM II: She is on Metformin 500 mg daily. BS's 140's. Lab Results  Component Value Date   HGBA1C 6.7 (H) 11/16/2019    Review of Systems  Constitutional: Positive for fatigue. Negative for activity change and appetite change.  HENT: Negative for mouth sores and nosebleeds.   Gastrointestinal: Negative for abdominal pain, nausea and vomiting.       Negative for changes in bowel habits.  Endocrine: Negative for polydipsia, polyphagia and polyuria.  Genitourinary: Negative for decreased urine volume, dysuria and hematuria.  Musculoskeletal: Negative for gait problem and myalgias.  Skin: Negative for rash and wound.  Neurological: Negative for syncope.  Psychiatric/Behavioral: Negative for confusion. The patient is nervous/anxious.   Rest see pertinent positives and negatives per HPI.   Current Outpatient Medications on File Prior to Visit  Medication Sig Dispense Refill  . adapalene (DIFFERIN) 0.1 % gel Apply  topically at bedtime. 45 g 0  . atomoxetine (STRATTERA) 100 MG capsule Take 1 capsule (100 mg total) by mouth daily. 90 capsule 3  . azelastine (ASTELIN) 0.1 % nasal spray Place 2 sprays into both nostrils 2 (two) times daily. Use in each nostril as directed 30 mL 5  . blood glucose meter kit and supplies KIT Dispense based on patient and insurance preference. Use up to four times daily as directed. (FOR ICD-9 250.00, 250.01). 1 each 0  . ferrous sulfate (SLOW FE) 160 (50 Fe) MG TBCR SR tablet Take 1 tablet (160 mg total) by mouth daily. 90 each 3  . gabapentin (NEURONTIN) 600 MG tablet Take 1 tablet (600 mg total) by mouth 3 (three) times daily. 270 tablet 1  . hydrochlorothiazide (HYDRODIURIL) 25 MG tablet 1/2 po qd 45 tablet 3  . levothyroxine (SYNTHROID) 50 MCG tablet Take 1 tablet (50 mcg total) by mouth daily. 90 tablet 3  . losartan (COZAAR) 100 MG tablet Take 1 tablet (100 mg total) by mouth daily. 90 tablet 3  . meloxicam (MOBIC) 7.5 MG tablet Take 1 tablet (7.5 mg total) by mouth daily. 30 tablet 0  . metFORMIN (GLUCOPHAGE) 500 MG tablet Take 1 tablet (500 mg total) by mouth daily with breakfast. 30 tablet 2  . metoprolol succinate (TOPROL-XL) 50 MG 24 hr tablet Take 1 tablet (50 mg total) by mouth 2 (two) times daily. 180 tablet 1  . montelukast (SINGULAIR) 10 MG tablet Take 1 tablet (10 mg total) by mouth at bedtime. 30 tablet 11  . NON FORMULARY [facility]  Scar Cream -  Verapamil 10%, Pentoxifylline 5% Apply 1-2 grams to affected  area 3-4 times daily Qty. 120 gm 3 refills    . Omega-3 Fatty Acids (FISH OIL PO) Take 1 tablet by mouth as needed. Reported on 03/12/2016    . ondansetron (ZOFRAN ODT) 4 MG disintegrating tablet Take 1 tablet (4 mg total) by mouth every 8 (eight) hours as needed for nausea or vomiting. 20 tablet 0  . PARoxetine (PAXIL) 30 MG tablet Take 1 tablet (30 mg total) by mouth daily. 90 tablet 1  . pravastatin (PRAVACHOL) 20 MG tablet Take 1 tablet (20 mg  total) by mouth every evening. 90 tablet 1  . ranitidine (ZANTAC) 150 MG tablet TAKE 1 TABLET BY MOUTH TWICE DAILY 60 tablet 5  . rosuvastatin (CRESTOR) 10 MG tablet Take 1 tablet three times a week. Monday, Wednesday, and Friday. 30 tablet 1  . tiZANidine (ZANAFLEX) 4 MG capsule Take 1 capsule (4 mg total) by mouth 3 (three) times daily. 30 capsule 0  . varenicline (CHANTIX CONTINUING MONTH PAK) 1 MG tablet Take 1 tablet (1 mg total) by mouth 2 (two) times daily. 60 tablet 2  . Vitamin D, Ergocalciferol, (DRISDOL) 1.25 MG (50000 UT) CAPS capsule Take 1 capsule (50,000 Units total) by mouth every 7 (seven) days. 4 capsule 0   No current facility-administered medications on file prior to visit.   Past Medical History:  Diagnosis Date  . Anemia   . Anxiety   . Back pain   . Chest pain   . Constipation   . DDD (degenerative disc disease)   . Depression   . Diabetes ([place])   . Epilepsy ([place])   . GERD (gastroesophageal reflux disease)   . HLD (hyperlipidemia)   . HTN (hypertension)   . Joint pain   . Leg edema   . 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 Osteoarthritis   . Sleep apnea   . Stomach ulcer   . Swallowing difficulty   . Thyroid disease    Hypothyroidism  . Vitamin D deficiency    Allergies  Allergen Reactions  . Penicillins   . Codeine Rash and Other (See Comments)    dizziness    Social History   Socioeconomic History  . Marital status: Legally Separated    Spouse name: Not on file  . Number of children: 3  . Years of education: Not on file  . Highest education level: Not on file  Occupational History  . Occupation: Ship broker  Tobacco Use  . Smoking status: Current Every Day Smoker    Packs/day: 0.75    Years: 44.00    Pack years: 33.00    Types: Cigarettes  . Smokeless tobacco: Never Used  . Tobacco comment: Started Chantix 08/24/2017  Substance and Sexual Activity  . Alcohol use: Yes    Alcohol/week: 0.0 standard drinks     Comment: 2-3 drinks per week (beer/wine)  . Drug use: No  . Sexual activity: Yes    Partners: Male    Birth control/protection: Post-menopausal  Other Topics Concern  . Not on file  Social History Narrative  . Not on file   Social Determinants of Health   Financial Resource Strain:   . Difficulty of Paying Living Expenses: Not on file  Food Insecurity:   . Worried About Charity fundraiser in the Last Year: Not on file  . Ran Out of Food in the Last Year: Not on file  Transportation Needs:   . Lack of Transportation (Medical): Not on file  .  Lack of Transportation (Non-Medical): Not on file  Physical Activity:   . Days of Exercise per Week: Not on file  . Minutes of Exercise per Session: Not on file  Stress:   . Feeling of Stress : Not on file  Social Connections:   . Frequency of Communication with Friends and Family: Not on file  . Frequency of Social Gatherings with Friends and Family: Not on file  . Attends Religious Services: Not on file  . Active Member of Clubs or Organizations: Not on file  . Attends Archivist Meetings: Not on file  . Marital Status: Not on file    Vitals:   12/24/19 1807 12/24/19 1847  BP: (!) 150/90 (!) 145/80  Pulse: (!) 101 100  Resp: 12   Temp: 98.1 F (36.7 C)    Body mass index is 32.24 kg/m.  Physical Exam  Nursing note and vitals reviewed. Constitutional: She is oriented to person, place, and time. She appears well-developed. No distress.  HENT:  Head: Normocephalic and atraumatic.  Mouth/Throat: Oropharynx is clear and moist and mucous membranes are normal.  Eyes: Pupils are equal, round, and reactive to light. Conjunctivae are normal.  Cardiovascular: Normal rate and regular rhythm.  No murmur heard. Respiratory: Effort normal and breath sounds normal. No respiratory distress. She exhibits no tenderness.  GI: Soft. She exhibits no mass. There is no hepatomegaly. There is no abdominal tenderness.  Musculoskeletal:         General: No edema.  Lymphadenopathy:    She has no cervical adenopathy.  Neurological: She is alert and oriented to person, place, and time. She has normal strength. No cranial nerve deficit. Gait normal.  Skin: Skin is warm. No rash noted. No erythema.  Psychiatric: Her mood appears anxious.  Well groomed, good eye contact.   ASSESSMENT AND PLAN:  Debra [name] was seen today for chest pain.  Diagnoses and all orders for this visit:  Chest pain, unspecified type She is not having pain today. Possible etiologies discussed, including GERD. High CV risk (tobacco use,HTN,DM II,and HLD), recommended cardiologist evaluation.  Encouraged smoking cessation.  EKG today: Sinus tachycardia, LAD, ?LAFB, T wave septal abnormalities, and LAE. Comparing with EKG on 07/22/18 Q in V1 and T wave inversion septal leads now present. Rest with no significant changes. Clearly instructed about warning signs.   -     EKG 12-Lead -     Ambulatory referral to Cardiology  Essential hypertension BP not well controlled today. Possible complications of elevated BP. For now no changes in current management, instructed to take meds as prescribed. Instructed to monitor BP at home. F/U with PCP in 2 weeks.   She voices understanding and agrees with plan.  Return in about 2 weeks (around 01/07/2020) for PCP, HTN.    Denia Mcvicar G. [place], MD  [facility]. [place] office.

## 2019-12-28 ENCOUNTER — Telehealth: Payer: Self-pay

## 2019-12-30 ENCOUNTER — Telehealth: Payer: Self-pay

## 2019-12-30 DIAGNOSIS — E039 Hypothyroidism, unspecified: Secondary | ICD-10-CM

## 2019-12-30 NOTE — Telephone Encounter (Signed)
Patient called in to see if Dr. Etter Sjogren could send in a prescription for metoprolol succinate (TOPROL-XL) 50 MG 24 hr tablet [id number]   & levothyroxine (SYNTHROID) 50 MCG tablet [id number]    Please send it to: [facility], [address]  [place], [place] [address]  Phone:  [phone] Fax:  [phone]   Thanks,

## 2019-12-31 ENCOUNTER — Ambulatory Visit (INDEPENDENT_AMBULATORY_CARE_PROVIDER_SITE_OTHER): Payer: 59 | Admitting: Cardiology

## 2019-12-31 ENCOUNTER — Other Ambulatory Visit: Payer: Self-pay

## 2019-12-31 ENCOUNTER — Encounter: Payer: Self-pay | Admitting: Cardiology

## 2019-12-31 VITALS — BP 142/90 | HR 90 | Ht 63.0 in | Wt 174.0 lb

## 2019-12-31 DIAGNOSIS — I700 Atherosclerosis of aorta: Secondary | ICD-10-CM | POA: Diagnosis not present

## 2019-12-31 DIAGNOSIS — I10 Essential (primary) hypertension: Secondary | ICD-10-CM

## 2019-12-31 DIAGNOSIS — R0602 Shortness of breath: Secondary | ICD-10-CM

## 2019-12-31 DIAGNOSIS — Z716 Tobacco abuse counseling: Secondary | ICD-10-CM | POA: Diagnosis not present

## 2019-12-31 DIAGNOSIS — R079 Chest pain, unspecified: Secondary | ICD-10-CM | POA: Diagnosis not present

## 2019-12-31 DIAGNOSIS — E7800 Pure hypercholesterolemia, unspecified: Secondary | ICD-10-CM

## 2019-12-31 DIAGNOSIS — E119 Type 2 diabetes mellitus without complications: Secondary | ICD-10-CM

## 2019-12-31 DIAGNOSIS — Z7189 Other specified counseling: Secondary | ICD-10-CM

## 2019-12-31 MED ORDER — LEVOTHYROXINE SODIUM 50 MCG PO TABS: 50.0000 ug | ORAL_TABLET | Freq: Every day | ORAL | 3 refills | Status: DC

## 2019-12-31 MED ORDER — METOPROLOL SUCCINATE ER 50 MG PO TB24: 50.0000 mg | ORAL_TABLET | Freq: Two times a day (BID) | ORAL | 1 refills | Status: DC

## 2019-12-31 NOTE — Patient Instructions (Addendum)
Medication Instructions:  Rosuvastatin replaced pravastatin, you do not need to take both. Ideally it would be great to work up to taking the rosuvastatin 10 mg every day, but you can work on ramping up slowly.  *If you need a refill on your cardiac medications before your next appointment, please call your pharmacy*   Lab Work: None   Testing/Procedures: Your physician has requested that you have a lexiscan myoview. For further information please visit [email or web address]. Please follow instruction sheet, as given. [place]  Follow-Up: At [facility], you and your health needs are our priority.  As part of our continuing mission to provide you with exceptional heart care, we have created designated Provider Care Teams.  These Care Teams include your primary Cardiologist (physician) and Advanced Practice Providers (APPs -  Physician Assistants and Nurse Practitioners) who all work together to provide you with the care you need, when you need it.  We recommend signing up for the patient portal called "MyChart".  Sign up information is provided on this After Visit Summary.  MyChart is used to connect with patients for Virtual Visits (Telemedicine).  Patients are able to view lab/test results, encounter notes, upcoming appointments, etc.  Non-urgent messages can be sent to your provider as well.   To learn more about what you can do with MyChart, go to [email or web address].    Your next appointment:   1 year(s)  The format for your next appointment:   In Person  Provider:   Buford Dresser, MD  You are scheduled for a Myocardial Perfusion Imaging Study on.  Please arrive 15 minutes prior to your appointment time for registration and insurance purposes.  The test will take approximately 3 to 4 hours to complete; you may bring reading material.  If someone comes with you to your appointment, they will need to remain in the main lobby due to limited space in  the testing area. **If you are pregnant or breastfeeding, please notify the nuclear lab prior to your appointment**  How to prepare for your Myocardial Perfusion Test: . Do not eat or drink 3 hours prior to your test, except you may have water. . Do not consume products containing caffeine (regular or decaffeinated) 12 hours prior to your test. (ex: coffee, chocolate, sodas, tea). . Do bring a list of your current medications with you.  If not listed below, you may take your medications as normal. . Do wear comfortable clothes (no dresses or overalls) and walking shoes, tennis shoes preferred (No heels or open toe shoes are allowed). . Do NOT wear cologne, perfume, aftershave, or lotions (deodorant is allowed). . If these instructions are not followed, your test will have to be rescheduled.  Please report to [place], [address] for your test.  If you have questions or concerns about your appointment, you can call the Nuclear Lab at [phone].  If you cannot keep your appointment, please provide 24 hours notification to the Nuclear Lab, to avoid a possible $50 charge to your account.

## 2019-12-31 NOTE — Telephone Encounter (Signed)
Refill sent.

## 2019-12-31 NOTE — Progress Notes (Addendum)
Cardiology Office Note:    Date:  12/31/2019   ID:  Debra [name], DOB [birth year], MRN [id number]  PCP:  Carollee Herter, Alferd Apa, DO  Cardiologist:  Buford Dresser, MD  Referring MD: Martinique, Betty G, MD   CC: new patient consultation for chest pain  History of Present Illness:    Debra [name] is a 64 y.o. female with a hx of hypertension, aortic atherosclerosis, hypothyroidism, tobacco abuse, type II diabetes who is seen as a new consult at the request of Martinique, Betty G, MD for the evaluation and management of chest pain.  Note from office visit 12/24/19 with Dr. Martinique reviewed. Noted that the prior week she had upper chest pain, unclear radiation. No associated symptoms.  Chest pain: -Initial onset: several weeks -Quality: felt like an ache initially, but then noted that her arm would hurt laying in bed, then progressed to central chest discomfort. Mild. Central to slightly right sided. Came on at night, not related to activity. -Frequency: occasional, difficult for her to determine how often -Duration: brief -Associated symptoms: no shortness of breath, no nausea, no diaphoresis (notes that she has these chronically/separately with allergies/GERD/menopause) -Aggravating/alleviating factors: none -Prior cardiac history: aortic atherosclerosis -Prior ECG: NSR -Prior workup: not that she knows of, though think she had to run on a treadmill for something  -Prior treatment: none -Alcohol: not every day, does not think it is a lot or often. -Tobacco: current, trying to quit. Discussed today. Amount related to stress, around 4-6 cigarettes/day -Comorbidities: hypertension, hyperlipidemia, type II diabetes not on insulin -Exercise level: has felt more depressed in the pandemic, has been less active. Can climb stairs, walk etc when needed. -Cardiac ROS: no shortness of breath, no PND, no orthopnea, no LE edema, no syncope -Family history: father has heart issues, deceased. She is  not sure of age of onset. Brother sees a heart doctor, unclear reason.  Has severe pain after myelography, thought it was due to the dye, but denies analphylaxis/contrast reaction.   Past Medical History:  Diagnosis Date  . Anemia   . Anxiety   . Back pain   . Chest pain   . Constipation   . DDD (degenerative disc disease)   . Depression   . Diabetes ([place])   . Epilepsy ([place])   . GERD (gastroesophageal reflux disease)   . HLD (hyperlipidemia)   . HTN (hypertension)   . Joint pain   . Leg edema   . 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 Osteoarthritis   . Sleep apnea   . Stomach ulcer   . Swallowing difficulty   . Thyroid disease    Hypothyroidism  . Vitamin D deficiency     Past Surgical History:  Procedure Laterality Date  . CARPAL TUNNEL RELEASE     Bilateral  . CERVICAL SPINE SURGERY     x3  . LEG SURGERY     Right   . WRIST FRACTURE SURGERY  10-02-15   Pt have surgery twice on the same wrist.    Current Medications: Current Outpatient Medications on File Prior to Visit  Medication Sig  . atomoxetine (STRATTERA) 100 MG capsule Take 1 capsule (100 mg total) by mouth daily.  Marland Kitchen azelastine (ASTELIN) 0.1 % nasal spray Place 2 sprays into both nostrils 2 (two) times daily. Use in each nostril as directed  . blood glucose meter kit and supplies KIT Dispense based on patient and insurance preference. Use up to four  times daily as directed. (FOR ICD-9 250.00, 250.01).  Marland Kitchen gabapentin (NEURONTIN) 600 MG tablet Take 1 tablet (600 mg total) by mouth 3 (three) times daily. (Patient taking differently: Take 600 mg by mouth as needed. )  . hydrochlorothiazide (HYDRODIURIL) 25 MG tablet 1/2 po qd  . levothyroxine (SYNTHROID) 50 MCG tablet Take 1 tablet (50 mcg total) by mouth daily.  Marland Kitchen losartan (COZAAR) 100 MG tablet Take 1 tablet (100 mg total) by mouth daily.  . meloxicam (MOBIC) 7.5 MG tablet Take 1 tablet (7.5 mg total) by mouth daily.  . metFORMIN  (GLUCOPHAGE) 500 MG tablet Take 1 tablet (500 mg total) by mouth daily with breakfast.  . metoprolol succinate (TOPROL-XL) 50 MG 24 hr tablet Take 1 tablet (50 mg total) by mouth 2 (two) times daily. (Patient taking differently: Take 50 mg by mouth daily. )  . montelukast (SINGULAIR) 10 MG tablet Take 1 tablet (10 mg total) by mouth at bedtime.  Salley Scarlet FORMULARY [facility]  Scar Cream -  Verapamil 10%, Pentoxifylline 5% Apply 1-2 grams to affected area 3-4 times daily Qty. 120 gm 3 refills  . ondansetron (ZOFRAN ODT) 4 MG disintegrating tablet Take 1 tablet (4 mg total) by mouth every 8 (eight) hours as needed for nausea or vomiting.  Marland Kitchen PARoxetine (PAXIL) 30 MG tablet Take 1 tablet (30 mg total) by mouth daily.  Marland Kitchen tiZANidine (ZANAFLEX) 4 MG capsule Take 1 capsule (4 mg total) by mouth 3 (three) times daily. (Patient taking differently: Take 4 mg by mouth 3 (three) times daily as needed. )  . varenicline (CHANTIX CONTINUING MONTH PAK) 1 MG tablet Take 1 tablet (1 mg total) by mouth 2 (two) times daily.  . Vitamin D, Ergocalciferol, (DRISDOL) 1.25 MG (50000 UT) CAPS capsule Take 1 capsule (50,000 Units total) by mouth every 7 (seven) days.  . rosuvastatin (CRESTOR) 10 MG tablet Take 1 tablet three times a week. Monday, Wednesday, and Friday.   No current facility-administered medications on file prior to visit.     Allergies:   Penicillins and Codeine   Social History   Tobacco Use  . Smoking status: Current Every Day Smoker    Packs/day: 0.75    Years: 44.00    Pack years: 33.00    Types: Cigarettes  . Smokeless tobacco: Never Used  . Tobacco comment: Started Chantix 08/24/2017  Substance Use Topics  . Alcohol use: Yes    Alcohol/week: 0.0 standard drinks    Comment: 2-3 drinks per week (beer/wine)  . Drug use: No    Family History: family history includes Anxiety disorder in her mother; Arthritis in her mother; Breast cancer in her mother; Cancer in her mother; Colon  cancer in her maternal grandfather; Depression in her mother; Diabetes in her sister; Heart disease in her father; Heart disease (age of onset: 72) in her mother; Hyperlipidemia in her mother and sister; Hypertension in her father and sister; Stroke in her father; Thyroid disease in her mother.  ROS:   Please see the history of present illness.  Additional pertinent ROS: Constitutional: Negative for chills, fever, unintentional weight loss  HENT: Negative for ear pain and hearing loss.   Eyes: Negative for loss of vision and eye pain.  Respiratory: Negative for cough, sputum, wheezing.   Cardiovascular: See HPI. Gastrointestinal: Negative for abdominal pain, melena, and hematochezia.  Genitourinary: Negative for dysuria and hematuria.  Musculoskeletal: Negative for falls and myalgias.  Skin: Negative for itching and rash.  Neurological: Negative for focal  weakness, focal sensory changes and loss of consciousness.  Endo/Heme/Allergies: Does not bruise/bleed easily.     EKGs/Labs/Other Studies Reviewed:    The following studies were reviewed today: No prior cardiac studies  EKG:  EKG is personally reviewed.  The ekg ordered today demonstrates normal sinus rhythm, borderline LAE  Recent Labs: 11/16/2019: ALT 11; BUN 11; Creatinine, Ser 0.74; Hemoglobin 12.1; Platelets 284.0; Potassium 4.6; Sodium 140; TSH 0.60  Recent Lipid Panel    Component Value Date/Time   CHOL 195 11/16/2019 1147   CHOL 206 (H) 07/22/2018 1057   TRIG 141.0 11/16/2019 1147   HDL 54.70 11/16/2019 1147   HDL 48 07/22/2018 1057   CHOLHDL 4 11/16/2019 1147   VLDL 28.2 11/16/2019 1147   LDLCALC 112 (H) 11/16/2019 1147   LDLCALC 123 (H) 07/22/2018 1057   LDLDIRECT 126.3 06/06/2011 1533    Physical Exam:    VS:  BP (!) 142/90   Pulse 90   Ht '5\' 3"'$  (1.6 m)   Wt 174 lb (78.9 kg)   BMI 30.82 kg/m     Wt Readings from Last 3 Encounters:  12/31/19 174 lb (78.9 kg)  12/24/19 182 lb (82.6 kg)  11/16/19 179 lb  9.6 oz (81.5 kg)    GEN: Well nourished, well developed in no acute distress HEENT: Normal, moist mucous membranes NECK: No JVD CARDIAC: regular rhythm, normal S1 and S2, no rubs or gallops. No murmurs. VASCULAR: Radial and DP pulses 2+ bilaterally. No carotid bruits RESPIRATORY:  Clear to auscultation without rales, wheezing or rhonchi  ABDOMEN: Soft, non-tender, non-distended MUSCULOSKELETAL:  Ambulates independently SKIN: Warm and dry, no edema NEUROLOGIC:  Alert and oriented x 3. No focal neuro deficits noted. PSYCHIATRIC:  Normal affect    ASSESSMENT:    1. Chest pain, unspecified type   2. Tobacco abuse counseling   3. Essential hypertension   4. Hyperlipidemia, unspecified hyperlipidemia type   5. Aortic atherosclerosis ([place])   6. Type 2 diabetes mellitus without complication, without long-term current use of insulin (H[facility])   7. Cardiac risk counseling   8. Counseling on health promotion and disease prevention    PLAN:    Shortness of breath, chest discomfort -discussed treadmill stress with imaging, nuclear stress/lexiscan, and CT coronary angiography. Discussed pros and cons of each, including but not limited to false positive/false negative risk, radiation risk, and risk of IV contrast dye. Based on shared decision making, decision was made to pursue chemical nuclear stress test as she wanted the fastest available test (we discussed CT but she did not want to wait up to a month) -discussed red flag warning signs that need immediate medical attention -will get EF on nuclear study (though if abnormal, would confirm with echo)  Tobacco abuse counseling: The patient was counseled on tobacco cessation today for 4 minutes.  Counseling included reviewing the risks of smoking tobacco products, how it impacts the patient's current medical diagnoses and different strategies for quitting.  Pharmacotherapy to aid in tobacco cessation was not prescribed today.  Hypertension: slightly  elevated today, monitor -continue HCTZ 12.5 mg daily, losartan 100 mg daily, metoprolol 50 mg (she is taking daily)  Hypercholesterolemia: -she was taking both pravastatin and rosuvastatin. Discussed that rosuvastatin replaces pravastatin. She is on rosuvastatin 10 mg 3 times/week, recommended working up to daily if tolerated  Aortic atherosclerosis: noted on imaging, prevention as noted. Consider adding aspirin 81 mg based on results of stress test. Would also consider avoiding NSAIDs if possible  Cardiac risk  counseling and prevention recommendations: -recommend heart healthy/Mediterranean diet, with whole grains, fruits, vegetable, fish, lean meats, nuts, and olive oil. Limit salt. -recommend moderate walking, 3-5 times/week for 30-50 minutes each session. Aim for at least 150 minutes.week. Goal should be pace of 3 miles/hours, or walking 1.5 miles in 30 minutes -recommend avoidance of tobacco products. Avoid excess alcohol. -Additional risk factor control:  -Diabetes, type II: A1c is 6.7, on metformin  -Lipids: reviewed labs from 11/16/19. LDL 112.  -Blood pressure control: as above  -Weight: BMI 30. Working on weight loss -ASCVD risk score: The 10-year ASCVD risk score Mikey Bussing DC Brooke Bonito., et al., 2013) is: 42.3%   Values used to calculate the score:     Age: 32 years     Sex: Female     Is Non-Hispanic African American: Yes     Diabetic: Yes     Tobacco smoker: Yes     Systolic Blood Pressure: 588 mmHg     Is BP treated: Yes     HDL Cholesterol: 54.7 mg/dL     Total Cholesterol: 195 mg/dL    Plan for follow up: if stress normal, follow up in 1 year. If abnormal, will bring back to discuss next steps  ADDENDED TO ADD: initial nuclear stress test declined by insurance. Recommended for treadmill only by insurance. We called and spoke with them. Current guidelines do not support treadmill only stress tests in diabetes, as these require imaging components to be diagnostic. We discussed at  the visit that she can climb stairs and walk short distances, but she has not been routinely active and is not sure that she could get to target heart rate on the treadmill. Based on this, my recommendation remains for lexiscan nuclear stress test for evaluation of her symptoms.  Buford Dresser, MD, PhD [place]  [facility]    Medication Adjustments/Labs and Tests Ordered: Current medicines are reviewed at length with the patient today.  Concerns regarding medicines are outlined above.  Orders Placed This Encounter  Procedures  . MYOCARDIAL PERFUSION IMAGING  . EKG 12-Lead   No orders of the defined types were placed in this encounter.   Patient Instructions  Medication Instructions:  Rosuvastatin replaced pravastatin, you do not need to take both. Ideally it would be great to work up to taking the rosuvastatin 10 mg every day, but you can work on ramping up slowly.  *If you need a refill on your cardiac medications before your next appointment, please call your pharmacy*   Lab Work: None   Testing/Procedures: Your physician has requested that you have a lexiscan myoview. For further information please visit [email or web address]. Please follow instruction sheet, as given. [place]  Follow-Up: At [facility], you and your health needs are our priority.  As part of our continuing mission to provide you with exceptional heart care, we have created designated Provider Care Teams.  These Care Teams include your primary Cardiologist (physician) and Advanced Practice Providers (APPs -  Physician Assistants and Nurse Practitioners) who all work together to provide you with the care you need, when you need it.  We recommend signing up for the patient portal called "MyChart".  Sign up information is provided on this After Visit Summary.  MyChart is used to connect with patients for Virtual Visits (Telemedicine).  Patients are able to view lab/test results,  encounter notes, upcoming appointments, etc.  Non-urgent messages can be sent to your provider as well.   To learn more  about what you can do with MyChart, go to [email or web address].    Your next appointment:   1 year(s)  The format for your next appointment:   In Person  Provider:   Buford Dresser, MD  You are scheduled for a Myocardial Perfusion Imaging Study on.  Please arrive 15 minutes prior to your appointment time for registration and insurance purposes.  The test will take approximately 3 to 4 hours to complete; you may bring reading material.  If someone comes with you to your appointment, they will need to remain in the main lobby due to limited space in the testing area. **If you are pregnant or breastfeeding, please notify the nuclear lab prior to your appointment**  How to prepare for your Myocardial Perfusion Test: . Do not eat or drink 3 hours prior to your test, except you may have water. . Do not consume products containing caffeine (regular or decaffeinated) 12 hours prior to your test. (ex: coffee, chocolate, sodas, tea). . Do bring a list of your current medications with you.  If not listed below, you may take your medications as normal. . Do wear comfortable clothes (no dresses or overalls) and walking shoes, tennis shoes preferred (No heels or open toe shoes are allowed). . Do NOT wear cologne, perfume, aftershave, or lotions (deodorant is allowed). . If these instructions are not followed, your test will have to be rescheduled.  Please report to [place], [address] for your test.  If you have questions or concerns about your appointment, you can call the Nuclear Lab at [phone].  If you cannot keep your appointment, please provide 24 hours notification to the Nuclear Lab, to avoid a possible $50 charge to your account.         Signed, Buford Dresser, MD PhD 12/31/2019 6:10 PM    [facility]

## 2020-01-04 ENCOUNTER — Telehealth (HOSPITAL_COMMUNITY): Payer: Self-pay

## 2020-01-04 NOTE — Telephone Encounter (Signed)
Encounter complete. 

## 2020-01-06 ENCOUNTER — Telehealth: Payer: Self-pay

## 2020-01-06 ENCOUNTER — Telehealth (HOSPITAL_COMMUNITY): Payer: Self-pay | Admitting: Cardiology

## 2020-01-06 NOTE — Telephone Encounter (Signed)
Patient cancelled her Myoview due to Insurance Denial from [facility] and was unable to take on as SELF PAY due to her finances. She is very upset that this happened.  Please can someone call patient for further directions on her health care.

## 2020-01-06 NOTE — Telephone Encounter (Signed)
Called patient to change her visit to virtual as she was not feeling well. Advised her to go get tested for covid prior to her appointment so that she at least has some piece of mind until next week. Advised her to stay in until results come back.

## 2020-01-06 NOTE — Telephone Encounter (Signed)
Debra [name], can you ask billing to speak with her to see if there are other options for coverage? I am also happy to speak with insurance to discuss necessity if that is the issue. Thanks.

## 2020-01-06 NOTE — Telephone Encounter (Signed)
Patient called in to see if she can speak with Dr. Etter Sjogren or the nurse about some issues she is having with her sinus before she comes in for her appt. On Tuesday 01/11/2020 please give the patient a call at [phone]   Thanks,

## 2020-01-07 ENCOUNTER — Ambulatory Visit (HOSPITAL_COMMUNITY): Admission: RE | Admit: 2020-01-07 | Payer: 59 | Source: Ambulatory Visit | Attending: Cardiology | Admitting: Cardiology

## 2020-01-10 ENCOUNTER — Other Ambulatory Visit: Payer: Self-pay

## 2020-01-11 ENCOUNTER — Ambulatory Visit (INDEPENDENT_AMBULATORY_CARE_PROVIDER_SITE_OTHER): Payer: 59 | Admitting: Family Medicine

## 2020-01-11 ENCOUNTER — Encounter: Payer: Self-pay | Admitting: Family Medicine

## 2020-01-11 ENCOUNTER — Other Ambulatory Visit: Payer: Self-pay

## 2020-01-11 VITALS — BP 148/88 | HR 78 | Temp 97.1°F | Resp 18 | Ht 63.0 in | Wt 177.6 lb

## 2020-01-11 DIAGNOSIS — I10 Essential (primary) hypertension: Secondary | ICD-10-CM

## 2020-01-11 DIAGNOSIS — IMO0002 Reserved for concepts with insufficient information to code with codable children: Secondary | ICD-10-CM

## 2020-01-11 DIAGNOSIS — E119 Type 2 diabetes mellitus without complications: Secondary | ICD-10-CM | POA: Diagnosis not present

## 2020-01-11 DIAGNOSIS — E1165 Type 2 diabetes mellitus with hyperglycemia: Secondary | ICD-10-CM

## 2020-01-11 DIAGNOSIS — Z8639 Personal history of other endocrine, nutritional and metabolic disease: Secondary | ICD-10-CM | POA: Insufficient documentation

## 2020-01-11 HISTORY — DX: Reserved for concepts with insufficient information to code with codable children: IMO0002

## 2020-01-11 HISTORY — DX: Type 2 diabetes mellitus with hyperglycemia: E11.65

## 2020-01-11 MED ORDER — METFORMIN HCL 500 MG PO TABS: 500.0000 mg | ORAL_TABLET | Freq: Every day | ORAL | 2 refills | Status: DC

## 2020-01-11 NOTE — Assessment & Plan Note (Signed)
Stable con't meds 

## 2020-01-11 NOTE — Patient Instructions (Signed)
DASH Eating Plan DASH stands for "Dietary Approaches to Stop Hypertension." The DASH eating plan is a healthy eating plan that has been shown to reduce high blood pressure (hypertension). It may also reduce your risk for type 2 diabetes, heart disease, and stroke. The DASH eating plan may also help with weight loss. What are tips for following this plan?  General guidelines  Avoid eating more than 2,300 mg (milligrams) of salt (sodium) a day. If you have hypertension, you may need to reduce your sodium intake to 1,500 mg a day.  Limit alcohol intake to no more than 1 drink a day for nonpregnant women and 2 drinks a day for men. One drink equals 12 oz of beer, 5 oz of wine, or 1 oz of hard liquor.  Work with your health care provider to maintain a healthy body weight or to lose weight. Ask what an ideal weight is for you.  Get at least 30 minutes of exercise that causes your heart to beat faster (aerobic exercise) most days of the week. Activities may include walking, swimming, or biking.  Work with your health care provider or diet and nutrition specialist (dietitian) to adjust your eating plan to your individual calorie needs. Reading food labels   Check food labels for the amount of sodium per serving. Choose foods with less than 5 percent of the Daily Value of sodium. Generally, foods with less than 300 mg of sodium per serving fit into this eating plan.  To find whole grains, look for the word "whole" as the first word in the ingredient list. Shopping  Buy products labeled as "low-sodium" or "no salt added."  Buy fresh foods. Avoid canned foods and premade or frozen meals. Cooking  Avoid adding salt when cooking. Use salt-free seasonings or herbs instead of table salt or sea salt. Check with your health care provider or pharmacist before using salt substitutes.  Do not fry foods. Cook foods using healthy methods such as baking, boiling, grilling, and broiling instead.  Cook with  heart-healthy oils, such as olive, canola, soybean, or sunflower oil. Meal planning  Eat a balanced diet that includes: ? 5 or more servings of fruits and vegetables each day. At each meal, try to fill half of your plate with fruits and vegetables. ? Up to 6-8 servings of whole grains each day. ? Less than 6 oz of lean meat, poultry, or fish each day. A 3-oz serving of meat is about the same size as a deck of cards. One egg equals 1 oz. ? 2 servings of low-fat dairy each day. ? A serving of nuts, seeds, or beans 5 times each week. ? Heart-healthy fats. Healthy fats called Omega-3 fatty acids are found in foods such as flaxseeds and coldwater fish, like sardines, salmon, and mackerel.  Limit how much you eat of the following: ? Canned or prepackaged foods. ? Food that is high in trans fat, such as fried foods. ? Food that is high in saturated fat, such as fatty meat. ? Sweets, desserts, sugary drinks, and other foods with added sugar. ? Full-fat dairy products.  Do not salt foods before eating.  Try to eat at least 2 vegetarian meals each week.  Eat more home-cooked food and less restaurant, buffet, and fast food.  When eating at a restaurant, ask that your food be prepared with less salt or no salt, if possible. What foods are recommended? The items listed may not be a complete list. Talk with your dietitian about   what dietary choices are best for you. Grains Whole-grain or whole-wheat bread. Whole-grain or whole-wheat pasta. Brown rice. Oatmeal. Quinoa. Bulgur. Whole-grain and low-sodium cereals. Pita bread. Low-fat, low-sodium crackers. Whole-wheat flour tortillas. Vegetables Fresh or frozen vegetables (raw, steamed, roasted, or grilled). Low-sodium or reduced-sodium tomato and vegetable juice. Low-sodium or reduced-sodium tomato sauce and tomato paste. Low-sodium or reduced-sodium canned vegetables. Fruits All fresh, dried, or frozen fruit. Canned fruit in natural juice (without  added sugar). Meat and other protein foods Skinless chicken or turkey. Ground chicken or turkey. Pork with fat trimmed off. Fish and seafood. Egg whites. Dried beans, peas, or lentils. Unsalted nuts, nut butters, and seeds. Unsalted canned beans. Lean cuts of beef with fat trimmed off. Low-sodium, lean deli meat. Dairy Low-fat (1%) or fat-free (skim) milk. Fat-free, low-fat, or reduced-fat cheeses. Nonfat, low-sodium ricotta or cottage cheese. Low-fat or nonfat yogurt. Low-fat, low-sodium cheese. Fats and oils Soft margarine without trans fats. Vegetable oil. Low-fat, reduced-fat, or light mayonnaise and salad dressings (reduced-sodium). Canola, safflower, olive, soybean, and sunflower oils. Avocado. Seasoning and other foods Herbs. Spices. Seasoning mixes without salt. Unsalted popcorn and pretzels. Fat-free sweets. What foods are not recommended? The items listed may not be a complete list. Talk with your dietitian about what dietary choices are best for you. Grains Baked goods made with fat, such as croissants, muffins, or some breads. Dry pasta or rice meal packs. Vegetables Creamed or fried vegetables. Vegetables in a cheese sauce. Regular canned vegetables (not low-sodium or reduced-sodium). Regular canned tomato sauce and paste (not low-sodium or reduced-sodium). Regular tomato and vegetable juice (not low-sodium or reduced-sodium). Pickles. Olives. Fruits Canned fruit in a light or heavy syrup. Fried fruit. Fruit in cream or butter sauce. Meat and other protein foods Fatty cuts of meat. Ribs. Fried meat. Bacon. Sausage. Bologna and other processed lunch meats. Salami. Fatback. Hotdogs. Bratwurst. Salted nuts and seeds. Canned beans with added salt. Canned or smoked fish. Whole eggs or egg yolks. Chicken or turkey with skin. Dairy Whole or 2% milk, cream, and half-and-half. Whole or full-fat cream cheese. Whole-fat or sweetened yogurt. Full-fat cheese. Nondairy creamers. Whipped toppings.  Processed cheese and cheese spreads. Fats and oils Butter. Stick margarine. Lard. Shortening. Ghee. Bacon fat. Tropical oils, such as coconut, palm kernel, or palm oil. Seasoning and other foods Salted popcorn and pretzels. Onion salt, garlic salt, seasoned salt, table salt, and sea salt. Worcestershire sauce. Tartar sauce. Barbecue sauce. Teriyaki sauce. Soy sauce, including reduced-sodium. Steak sauce. Canned and packaged gravies. Fish sauce. Oyster sauce. Cocktail sauce. Horseradish that you find on the shelf. Ketchup. Mustard. Meat flavorings and tenderizers. Bouillon cubes. Hot sauce and Tabasco sauce. Premade or packaged marinades. Premade or packaged taco seasonings. Relishes. Regular salad dressings. Where to find more information:  National Heart, Lung, and Blood Institute: www.nhlbi.nih.gov  American Heart Association: www.heart.org Summary  The DASH eating plan is a healthy eating plan that has been shown to reduce high blood pressure (hypertension). It may also reduce your risk for type 2 diabetes, heart disease, and stroke.  With the DASH eating plan, you should limit salt (sodium) intake to 2,300 mg a day. If you have hypertension, you may need to reduce your sodium intake to 1,500 mg a day.  When on the DASH eating plan, aim to eat more fresh fruits and vegetables, whole grains, lean proteins, low-fat dairy, and heart-healthy fats.  Work with your health care provider or diet and nutrition specialist (dietitian) to adjust your eating plan to your   individual calorie needs. This information is not intended to replace advice given to you by your health care provider. Make sure you discuss any questions you have with your health care provider. Document Revised: 09/26/2017 Document Reviewed: 10/07/2016 Elsevier Patient Education  2020 Elsevier Inc.  

## 2020-01-11 NOTE — Progress Notes (Signed)
Patient ID: Debra [name], female    DOB: May 02, 1956  Age: 64 y.o. MRN: [id number]    Subjective:  Subjective  HPI Debra [name] presents for f/u bp  Her bs have been much better  She received her first covid vaccine Sunday  bs running 120s  No complaints  Review of Systems  Constitutional: Negative for appetite change, diaphoresis, fatigue and unexpected weight change.  Eyes: Negative for pain, redness and visual disturbance.  Respiratory: Negative for cough, chest tightness, shortness of breath and wheezing.   Cardiovascular: Negative for chest pain, palpitations and leg swelling.  Endocrine: Negative for cold intolerance, heat intolerance, polydipsia, polyphagia and polyuria.  Genitourinary: Negative for difficulty urinating, dysuria and frequency.  Neurological: Negative for dizziness, light-headedness, numbness and headaches.    History Past Medical History:  Diagnosis Date  . Anemia   . Anxiety   . Back pain   . Chest pain   . Constipation   . DDD (degenerative disc disease)   . Depression   . Diabetes ([place])   . Epilepsy ([place])   . GERD (gastroesophageal reflux disease)   . HLD (hyperlipidemia)   . HTN (hypertension)   . Joint pain   . Leg edema   . 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 Osteoarthritis   . Sleep apnea   . Stomach ulcer   . Swallowing difficulty   . Thyroid disease    Hypothyroidism  . Vitamin D deficiency     She has a past surgical history that includes Carpal tunnel release; Leg Surgery; Cervical spine surgery; and Wrist fracture surgery (10-02-15).   Her family history includes Anxiety disorder in her mother; Arthritis in her mother; Breast cancer in her mother; Cancer in her mother; Colon cancer in her maternal grandfather; Depression in her mother; Diabetes in her sister; Heart disease in her father; Heart disease (age of onset: 43) in her mother; Hyperlipidemia in her mother and sister; Hypertension in her father  and sister; Stroke in her father; Thyroid disease in her mother.She reports that she has been smoking cigarettes. She has a 33.00 pack-year smoking history. She has never used smokeless tobacco. She reports current alcohol use. She reports that she does not use drugs.  Current Outpatient Medications on File Prior to Visit  Medication Sig Dispense Refill  . atomoxetine (STRATTERA) 100 MG capsule Take 1 capsule (100 mg total) by mouth daily. 90 capsule 3  . azelastine (ASTELIN) 0.1 % nasal spray Place 2 sprays into both nostrils 2 (two) times daily. Use in each nostril as directed 30 mL 5  . blood glucose meter kit and supplies KIT Dispense based on patient and insurance preference. Use up to four times daily as directed. (FOR ICD-9 250.00, 250.01). 1 each 0  . gabapentin (NEURONTIN) 600 MG tablet Take 1 tablet (600 mg total) by mouth 3 (three) times daily. (Patient taking differently: Take 600 mg by mouth as needed. ) 270 tablet 1  . hydrochlorothiazide (HYDRODIURIL) 25 MG tablet 1/2 po qd 45 tablet 3  . levothyroxine (SYNTHROID) 50 MCG tablet Take 1 tablet (50 mcg total) by mouth daily. 90 tablet 3  . losartan (COZAAR) 100 MG tablet Take 1 tablet (100 mg total) by mouth daily. 90 tablet 3  . meloxicam (MOBIC) 7.5 MG tablet Take 1 tablet (7.5 mg total) by mouth daily. 30 tablet 0  . metoprolol succinate (TOPROL-XL) 50 MG 24 hr tablet Take 1 tablet (50 mg total) by mouth  2 (two) times daily. (Patient taking differently: Take 50 mg by mouth daily. ) 180 tablet 1  . montelukast (SINGULAIR) 10 MG tablet Take 1 tablet (10 mg total) by mouth at bedtime. 30 tablet 11  . NON FORMULARY [facility]  Scar Cream -  Verapamil 10%, Pentoxifylline 5% Apply 1-2 grams to affected area 3-4 times daily Qty. 120 gm 3 refills    . ondansetron (ZOFRAN ODT) 4 MG disintegrating tablet Take 1 tablet (4 mg total) by mouth every 8 (eight) hours as needed for nausea or vomiting. 20 tablet 0  . PARoxetine (PAXIL) 30 MG  tablet Take 1 tablet (30 mg total) by mouth daily. 90 tablet 1  . rosuvastatin (CRESTOR) 10 MG tablet Take 1 tablet three times a week. Monday, Wednesday, and Friday. 30 tablet 1  . tiZANidine (ZANAFLEX) 4 MG capsule Take 1 capsule (4 mg total) by mouth 3 (three) times daily. (Patient taking differently: Take 4 mg by mouth 3 (three) times daily as needed. ) 30 capsule 0  . varenicline (CHANTIX CONTINUING MONTH PAK) 1 MG tablet Take 1 tablet (1 mg total) by mouth 2 (two) times daily. 60 tablet 2  . Vitamin D, Ergocalciferol, (DRISDOL) 1.25 MG (50000 UT) CAPS capsule Take 1 capsule (50,000 Units total) by mouth every 7 (seven) days. 4 capsule 0   No current facility-administered medications on file prior to visit.     Objective:  Objective  Physical Exam Vitals and nursing note reviewed.  Constitutional:      Appearance: She is well-developed.  HENT:     Head: Normocephalic and atraumatic.  Eyes:     Conjunctiva/sclera: Conjunctivae normal.  Neck:     Thyroid: No thyromegaly.     Vascular: No carotid bruit or JVD.  Cardiovascular:     Rate and Rhythm: Normal rate and regular rhythm.     Heart sounds: Normal heart sounds. No murmur.  Pulmonary:     Effort: Pulmonary effort is normal. No respiratory distress.     Breath sounds: Normal breath sounds. No wheezing or rales.  Chest:     Chest wall: No tenderness.  Musculoskeletal:     Cervical back: Normal range of motion and neck supple.  Neurological:     Mental Status: She is alert and oriented to person, place, and time.    BP (!) 148/88 (BP Location: Right Arm, Patient Position: Sitting, Cuff Size: Normal)   Pulse 78   Temp (!) 97.1 F (36.2 C) (Temporal)   Resp 18   Ht _0  (1.6 m)   Wt 177 lb 9.6 oz (80.6 kg)   BMI 31.46 kg/m  Wt Readings from Last 3 Encounters:  01/11/20 177 lb 9.6 oz (80.6 kg)  12/31/19 174 lb (78.9 kg)  12/24/19 182 lb (82.6 kg)     Lab Results  Component Value Date   WBC 5.5 11/16/2019   HGB  12.1 11/16/2019   HCT 36.4 11/16/2019   PLT 284.0 11/16/2019   GLUCOSE 95 11/16/2019   CHOL 195 11/16/2019   TRIG 141.0 11/16/2019   HDL 54.70 11/16/2019   LDLDIRECT 126.3 06/06/2011   LDLCALC 112 (H) 11/16/2019   ALT 11 11/16/2019   AST 18 11/16/2019   NA 140 11/16/2019   K 4.6 11/16/2019   CL 103 11/16/2019   CREATININE 0.74 11/16/2019   BUN 11 11/16/2019   CO2 31 11/16/2019   TSH 0.60 11/16/2019   HGBA1C 6.7 (H) 11/16/2019   MICROALBUR 3.1 (H) 11/07/2014  US THYROID  Result Date: 06/09/2019 CLINICAL DATA:  Remote partial left hemithyroidectomy.  Goiter. EXAM: THYROID ULTRASOUND TECHNIQUE: Ultrasound examination of the thyroid gland and adjacent soft tissues was performed. COMPARISON:  11/11/2005 by report only FINDINGS: Parenchymal Echotexture: Mildly heterogenous Isthmus: 0.3 cm thickness Right lobe: 4.2 x 1.4 x 1.4 cm, previously 5.2 x 2 x 1.7 by report Left lobe: Surgically absent _________________________________________________________ Estimated total number of nodules >/= 1 cm: 1 Number of spongiform nodules >/=  2 cm not described below (TR1): 0 Number of mixed cystic and solid nodules >/= 1.5 cm not described below (TR2): 0 _________________________________________________________ 0.5 cm hypoechoic nodule without calcifications, superior right; This nodule does NOT meet TI-RADS criteria for biopsy or dedicated follow-up. Nodule # 2: Location: Isthmus; right of midline Maximum size: 1 cm; Other 2 dimensions: 0.6 x 0.8 cm Composition: mixed cystic and solid (1) Echogenicity: isoechoic (1) Shape: not taller-than-wide (0) Margins: smooth (0) Echogenic foci: none (0) ACR TI-RADS total points: 2. ACR TI-RADS risk category: TR2 (2 points). ACR TI-RADS recommendations: This nodule does NOT meet TI-RADS criteria for biopsy or dedicated follow-up. _________________________________________________________ IMPRESSION: 1. No residual/recurrent tissue post left hemithyroidectomy. 2. Small  right and isthmic lesions do not meet criteria for biopsy or dedicated imaging follow-up. The above is in keeping with the ACR TI-RADS recommendations - J Am Coll Radiol 2017;14:587-595. Electronically Signed   By: Lucrezia Europe M.D.   On: 06/09/2019 08:09     Assessment & Plan:  Plan  I am having [name] maintain her NON FORMULARY, hydrochlorothiazide, Vitamin D (Ergocalciferol), montelukast, ondansetron, PARoxetine, gabapentin, azelastine, varenicline, losartan, blood glucose meter kit and supplies, rosuvastatin, tiZANidine, meloxicam, atomoxetine, levothyroxine, metoprolol succinate, and metFORMIN.  Meds ordered this encounter  Medications  . metFORMIN (GLUCOPHAGE) 500 MG tablet    Sig: Take 1 tablet (500 mg total) by mouth daily with breakfast.    Dispense:  30 tablet    Refill:  2    Problem List Items Addressed This Visit      Unprioritized   Diabetes mellitus type II, uncontrolled ([place])    Stable con't meds       Relevant Medications   metFORMIN (GLUCOPHAGE) 500 MG tablet   Essential hypertension - Primary    Sl high today but pt has not taken her meds today con't meds F/u as scheduled          Follow-up: Return if symptoms worsen or fail to improve.  Ann Held, DO

## 2020-01-11 NOTE — Assessment & Plan Note (Signed)
Sl high today but pt has not taken her meds today con't meds F/u as scheduled

## 2020-01-12 NOTE — Telephone Encounter (Signed)
Contacted insurance for peer to peer review. Left message to call back.

## 2020-01-12 NOTE — Telephone Encounter (Signed)
Spoke with Brock Ra from [facility] who requested nurse fax office notes for reconsideration to [phone]

## 2020-01-21 ENCOUNTER — Telehealth: Payer: Self-pay

## 2020-01-24 ENCOUNTER — Telehealth: Payer: Self-pay | Admitting: Cardiology

## 2020-01-24 DIAGNOSIS — R079 Chest pain, unspecified: Secondary | ICD-10-CM

## 2020-01-24 NOTE — Telephone Encounter (Signed)
The patient called into state that her Insurance has approved "something" but isn't sure what was approved.   She would like to know if the papers were also received by the office as well.

## 2020-01-24 NOTE — Telephone Encounter (Signed)
Patient would like to speak to nurse about a test that she was for, she states she has some information. She states it has been approved.

## 2020-01-25 NOTE — Telephone Encounter (Signed)
Nurse informed pt that we have not received approval letter. Pt report she will call insurance to make sure the approval letter she received was for lexiscan.

## 2020-02-03 ENCOUNTER — Telehealth: Payer: Self-pay | Admitting: Family Medicine

## 2020-02-03 NOTE — Telephone Encounter (Signed)
Caller : Sonya Fegely  Call Back # [phone]  Concern : Patient would Dr Carollee Herter to fill out a Handicapp Form for [facility], patient states that her place card has expired.   Please Advise

## 2020-02-04 NOTE — Telephone Encounter (Signed)
Pt called. VM left. New handicap form placed in mail.

## 2020-02-15 ENCOUNTER — Telehealth: Payer: Self-pay | Admitting: Family Medicine

## 2020-02-15 DIAGNOSIS — G8929 Other chronic pain: Secondary | ICD-10-CM

## 2020-02-15 MED ORDER — GABAPENTIN 600 MG PO TABS: 600.0000 mg | ORAL_TABLET | Freq: Three times a day (TID) | ORAL | 1 refills | Status: DC

## 2020-02-15 NOTE — Telephone Encounter (Signed)
Refill sent.

## 2020-02-15 NOTE — Telephone Encounter (Signed)
Medication: gabapentin (NEURONTIN) 600 MG tablet [id number]   Has the patient contacted their pharmacy? No. (If no, request that the patient contact the pharmacy for the refill.) (If yes, when and what did the pharmacy advise?)  Preferred Pharmacy (with phone number or street name):  [facility], [address] Phone:  [phone]  Fax:  [phone]       Agent: Please be advised that RX refills may take up to 3 business days. We ask that you follow-up with your pharmacy.

## 2020-02-15 NOTE — Telephone Encounter (Signed)
Caller: Quincie Seiss Call Back # [phone]  Subject : Pain   Pt states abd pain and back patient   Please advise patient on what to do please.

## 2020-02-17 NOTE — Telephone Encounter (Signed)
Spoke with [facility] and received approval to proceed with Lexiscan. New orders placed and pt made aware.   Message sent to scheduling to schedule date.

## 2020-02-17 NOTE — Addendum Note (Signed)
Addended by: Meryl Crutch on: 02/17/2020 10:55 AM   Modules accepted: Orders

## 2020-02-22 ENCOUNTER — Other Ambulatory Visit: Payer: Self-pay

## 2020-02-22 ENCOUNTER — Other Ambulatory Visit (INDEPENDENT_AMBULATORY_CARE_PROVIDER_SITE_OTHER): Payer: 59

## 2020-02-22 DIAGNOSIS — E1165 Type 2 diabetes mellitus with hyperglycemia: Secondary | ICD-10-CM

## 2020-02-22 DIAGNOSIS — R739 Hyperglycemia, unspecified: Secondary | ICD-10-CM

## 2020-02-22 DIAGNOSIS — E785 Hyperlipidemia, unspecified: Secondary | ICD-10-CM

## 2020-02-22 DIAGNOSIS — E1169 Type 2 diabetes mellitus with other specified complication: Secondary | ICD-10-CM

## 2020-02-22 DIAGNOSIS — R7989 Other specified abnormal findings of blood chemistry: Secondary | ICD-10-CM

## 2020-02-22 LAB — CBC WITH DIFFERENTIAL/PLATELET
Basophils Absolute: 0 10*3/uL (ref 0.0–0.1)
Basophils Relative: 0.2 % (ref 0.0–3.0)
Eosinophils Absolute: 0.1 10*3/uL (ref 0.0–0.7)
Eosinophils Relative: 0.9 % (ref 0.0–5.0)
HCT: 32.8 % — ABNORMAL LOW (ref 36.0–46.0)
Hemoglobin: 11 g/dL — ABNORMAL LOW (ref 12.0–15.0)
Lymphocytes Relative: 33.6 % (ref 12.0–46.0)
Lymphs Abs: 2.2 10*3/uL (ref 0.7–4.0)
MCHC: 33.4 g/dL (ref 30.0–36.0)
MCV: 85 fl (ref 78.0–100.0)
Monocytes Absolute: 0.7 10*3/uL (ref 0.1–1.0)
Monocytes Relative: 10.1 % (ref 3.0–12.0)
Neutro Abs: 3.7 10*3/uL (ref 1.4–7.7)
Neutrophils Relative %: 55.2 % (ref 43.0–77.0)
Platelets: 265 10*3/uL (ref 150.0–400.0)
RBC: 3.86 Mil/uL — ABNORMAL LOW (ref 3.87–5.11)
RDW: 14 % (ref 11.5–15.5)
WBC: 6.7 10*3/uL (ref 4.0–10.5)

## 2020-02-22 LAB — LIPID PANEL
Cholesterol: 163 mg/dL (ref 0–200)
HDL: 48.1 mg/dL (ref >39.00)
LDL Cholesterol: 94 mg/dL (ref 0–99)
NonHDL: 115.36
Total CHOL/HDL Ratio: 3
Triglycerides: 106 mg/dL (ref 0.0–149.0)
VLDL: 21.2 mg/dL (ref 0.0–40.0)

## 2020-02-22 LAB — MICROALBUMIN / CREATININE URINE RATIO
Creatinine,U: 104.2 mg/dL
Microalb Creat Ratio: 1.7 mg/g (ref 0.0–30.0)
Microalb, Ur: 1.8 mg/dL (ref 0.0–1.9)

## 2020-02-22 LAB — COMPREHENSIVE METABOLIC PANEL
ALT: 19 U/L (ref 0–35)
AST: 25 U/L (ref 0–37)
Albumin: 4.2 g/dL (ref 3.5–5.2)
Alkaline Phosphatase: 103 U/L (ref 39–117)
BUN: 15 mg/dL (ref 6–23)
CO2: 30 mEq/L (ref 19–32)
Calcium: 9.4 mg/dL (ref 8.4–10.5)
Chloride: 104 mEq/L (ref 96–112)
Creatinine, Ser: 0.69 mg/dL (ref 0.40–1.20)
GFR: 103.45 mL/min (ref >60.00)
Glucose, Bld: 109 mg/dL — ABNORMAL HIGH (ref 70–99)
Potassium: 4.7 mEq/L (ref 3.5–5.1)
Sodium: 140 mEq/L (ref 135–145)
Total Bilirubin: 0.4 mg/dL (ref 0.2–1.2)
Total Protein: 7.6 g/dL (ref 6.0–8.3)

## 2020-02-22 LAB — HEMOGLOBIN A1C: Hgb A1c MFr Bld: 6.7 % — ABNORMAL HIGH (ref 4.6–6.5)

## 2020-02-22 NOTE — Addendum Note (Signed)
Addended by: Caffie Pinto on: 02/22/2020 10:51 AM   Modules accepted: Orders

## 2020-02-23 LAB — THYROID PANEL WITH TSH
Free Thyroxine Index: 1.4 (ref 1.4–3.8)
T3 Uptake: 47 % — ABNORMAL HIGH (ref 22–35)
T4, Total: 2.9 ug/dL — ABNORMAL LOW (ref 5.1–11.9)
TSH: 1.92 mIU/L (ref 0.40–4.50)

## 2020-03-01 ENCOUNTER — Encounter (HOSPITAL_COMMUNITY): Payer: Self-pay | Admitting: *Deleted

## 2020-03-01 ENCOUNTER — Telehealth (HOSPITAL_COMMUNITY): Payer: Self-pay | Admitting: *Deleted

## 2020-03-01 NOTE — Telephone Encounter (Signed)
Patient given detailed instructions per Myocardial Perfusion Study Information Sheet for the test on 03/06/2020 at 1045. Patient notified to arrive 15 minutes early and that it is imperative to arrive on time for appointment to keep from having the test rescheduled.  If you need to cancel or reschedule your appointment, please call the office within 24 hours of your appointment. . Patient verbalized understanding.[facility] Mychart letter sent with instructions.

## 2020-03-06 ENCOUNTER — Ambulatory Visit (HOSPITAL_COMMUNITY): Payer: 59 | Attending: Cardiology

## 2020-03-06 ENCOUNTER — Other Ambulatory Visit: Payer: Self-pay

## 2020-03-06 VITALS — Ht 63.0 in | Wt 177.0 lb

## 2020-03-06 DIAGNOSIS — R0602 Shortness of breath: Secondary | ICD-10-CM | POA: Insufficient documentation

## 2020-03-06 DIAGNOSIS — R079 Chest pain, unspecified: Secondary | ICD-10-CM | POA: Diagnosis present

## 2020-03-06 LAB — MYOCARDIAL PERFUSION IMAGING
LV dias vol: 62 mL (ref 46–106)
LV sys vol: 25 mL
Peak HR: 103 {beats}/min
Rest HR: 85 {beats}/min
SDS: 0
SRS: 0
SSS: 0
TID: 0.89

## 2020-03-06 MED ORDER — TECHNETIUM TC 99M TETROFOSMIN IV KIT
32.8000 | PACK | Freq: Once | INTRAVENOUS | Status: AC | PRN
  Administered 2020-03-06: 32.8 via INTRAVENOUS
  Filled 2020-03-06: qty 33

## 2020-03-06 MED ORDER — TECHNETIUM TC 99M TETROFOSMIN IV KIT
10.6000 | PACK | Freq: Once | INTRAVENOUS | Status: AC | PRN
  Administered 2020-03-06: 10.6 via INTRAVENOUS
  Filled 2020-03-06: qty 11

## 2020-03-06 MED ORDER — REGADENOSON 0.4 MG/5ML IV SOLN
0.4000 mg | Freq: Once | INTRAVENOUS | Status: AC
  Administered 2020-03-06: 0.4 mg via INTRAVENOUS

## 2020-04-01 ENCOUNTER — Telehealth: Payer: 59

## 2020-04-07 ENCOUNTER — Encounter: Payer: Self-pay | Admitting: Family Medicine

## 2020-04-07 ENCOUNTER — Ambulatory Visit (INDEPENDENT_AMBULATORY_CARE_PROVIDER_SITE_OTHER): Payer: 59 | Admitting: Family Medicine

## 2020-04-07 ENCOUNTER — Other Ambulatory Visit: Payer: Self-pay

## 2020-04-07 VITALS — BP 140/80 | HR 82 | Temp 97.7°F | Resp 18 | Ht 63.0 in | Wt 181.2 lb

## 2020-04-07 DIAGNOSIS — Z1231 Encounter for screening mammogram for malignant neoplasm of breast: Secondary | ICD-10-CM

## 2020-04-07 DIAGNOSIS — K449 Diaphragmatic hernia without obstruction or gangrene: Secondary | ICD-10-CM

## 2020-04-07 MED ORDER — PANTOPRAZOLE SODIUM 40 MG PO TBEC: 40.0000 mg | DELAYED_RELEASE_TABLET | Freq: Every day | ORAL | 3 refills | Status: DC

## 2020-04-07 NOTE — Progress Notes (Signed)
Patient ID: Debra [name], female    DOB: October 24, 1956  Age: 64 y.o. MRN: [id number]    Subjective:  Subjective  HPI Debra [name] presents for f/u hiatal hernia.  She ate almonds and they seemed to get stuck and caused chest pain--- eventually they did go down  Review of Systems  Constitutional: Negative for appetite change, diaphoresis, fatigue and unexpected weight change.  Eyes: Negative for pain, redness and visual disturbance.  Respiratory: Negative for cough, chest tightness, shortness of breath and wheezing.   Cardiovascular: Negative for chest pain, palpitations and leg swelling.  Endocrine: Negative for cold intolerance, heat intolerance, polydipsia, polyphagia and polyuria.  Genitourinary: Negative for difficulty urinating, dysuria and frequency.  Neurological: Negative for dizziness, light-headedness, numbness and headaches.    History Past Medical History:  Diagnosis Date  . Anemia   . Anxiety   . Back pain   . Chest pain   . Constipation   . DDD (degenerative disc disease)   . Depression   . Diabetes (HCC)   . Epilepsy (HCC)   . GERD (gastroesophageal reflux disease)   . HLD (hyperlipidemia)   . HTN (hypertension)   . Joint pain   . Leg edema   . 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 Osteoarthritis   . Sleep apnea   . Stomach ulcer   . Swallowing difficulty   . Thyroid disease    Hypothyroidism  . Vitamin D deficiency     She has a past surgical history that includes Carpal tunnel release; Leg Surgery; Cervical spine surgery; and Wrist fracture surgery (10-02-15).   Her family history includes Anxiety disorder in her mother; Arthritis in her mother; Breast cancer in her mother; Cancer in her mother; Colon cancer in her maternal grandfather; Depression in her mother; Diabetes in her sister; Heart disease in her father; Heart disease (age of onset: 55) in her mother; Hyperlipidemia in her mother and sister; Hypertension in her father and  sister; Stroke in her father; Thyroid disease in her mother.She reports that she has been smoking cigarettes. She has a 33.00 pack-year smoking history. She has never used smokeless tobacco. She reports current alcohol use. She reports that she does not use drugs.  Current Outpatient Medications on File Prior to Visit  Medication Sig Dispense Refill  . atomoxetine (STRATTERA) 100 MG capsule Take 1 capsule (100 mg total) by mouth daily. 90 capsule 3  . azelastine (ASTELIN) 0.1 % nasal spray Place 2 sprays into both nostrils 2 (two) times daily. Use in each nostril as directed 30 mL 5  . blood glucose meter kit and supplies KIT Dispense based on patient and insurance preference. Use up to four times daily as directed. (FOR ICD-9 250.00, 250.01). 1 each 0  . gabapentin (NEURONTIN) 600 MG tablet Take 1 tablet (600 mg total) by mouth 3 (three) times daily. 270 tablet 1  . hydrochlorothiazide (HYDRODIURIL) 25 MG tablet 1/2 po qd 45 tablet 3  . levothyroxine (SYNTHROID) 50 MCG tablet Take 1 tablet (50 mcg total) by mouth daily. 90 tablet 3  . losartan (COZAAR) 100 MG tablet Take 1 tablet (100 mg total) by mouth daily. 90 tablet 3  . meloxicam (MOBIC) 7.5 MG tablet Take 1 tablet (7.5 mg total) by mouth daily. 30 tablet 0  . metFORMIN (GLUCOPHAGE) 500 MG tablet Take 1 tablet (500 mg total) by mouth daily with breakfast. 30 tablet 2  . metoprolol succinate (TOPROL-XL) 50 MG 24 hr tablet  Take 1 tablet (50 mg total) by mouth 2 (two) times daily. (Patient taking differently: Take 50 mg by mouth daily. ) 180 tablet 1  . montelukast (SINGULAIR) 10 MG tablet Take 1 tablet (10 mg total) by mouth at bedtime. 30 tablet 11  . NON FORMULARY [facility]  Scar Cream -  Verapamil 10%, Pentoxifylline 5% Apply 1-2 grams to affected area 3-4 times daily Qty. 120 gm 3 refills    . ondansetron (ZOFRAN ODT) 4 MG disintegrating tablet Take 1 tablet (4 mg total) by mouth every 8 (eight) hours as needed for nausea or  vomiting. 20 tablet 0  . PARoxetine (PAXIL) 30 MG tablet Take 1 tablet (30 mg total) by mouth daily. 90 tablet 1  . rosuvastatin (CRESTOR) 10 MG tablet Take 1 tablet three times a week. Monday, Wednesday, and Friday. 30 tablet 1  . tiZANidine (ZANAFLEX) 4 MG capsule Take 1 capsule (4 mg total) by mouth 3 (three) times daily. (Patient taking differently: Take 4 mg by mouth 3 (three) times daily as needed. ) 30 capsule 0  . varenicline (CHANTIX CONTINUING MONTH PAK) 1 MG tablet Take 1 tablet (1 mg total) by mouth 2 (two) times daily. 60 tablet 2  . Vitamin D, Ergocalciferol, (DRISDOL) 1.25 MG (50000 UT) CAPS capsule Take 1 capsule (50,000 Units total) by mouth every 7 (seven) days. 4 capsule 0   No current facility-administered medications on file prior to visit.     Objective:  Objective  Physical Exam Nursing note reviewed.  Constitutional:      Appearance: She is well-developed.  HENT:     Head: Normocephalic and atraumatic.  Eyes:     Conjunctiva/sclera: Conjunctivae normal.  Neck:     Thyroid: No thyromegaly.     Vascular: No carotid bruit or JVD.  Cardiovascular:     Rate and Rhythm: Normal rate and regular rhythm.     Heart sounds: Normal heart sounds. No murmur heard.   Pulmonary:     Effort: Pulmonary effort is normal. No respiratory distress.     Breath sounds: Normal breath sounds. No wheezing or rales.  Chest:     Chest wall: No tenderness.  Musculoskeletal:     Cervical back: Normal range of motion and neck supple.  Neurological:     Mental Status: She is alert and oriented to person, place, and time.    BP 140/80 (BP Location: Right Arm, Patient Position: Sitting, Cuff Size: Large)   Pulse 82   Temp 97.7 F (36.5 C) (Temporal)   Resp 18   Ht '5\' 3"'$  (1.6 m)   Wt 181 lb 3.2 oz (82.2 kg)   SpO2 100%   BMI 32.10 kg/m  Wt Readings from Last 3 Encounters:  04/07/20 181 lb 3.2 oz (82.2 kg)  03/06/20 177 lb (80.3 kg)  01/11/20 177 lb 9.6 oz (80.6 kg)     Lab  Results  Component Value Date   WBC 6.7 02/22/2020   HGB 11.0 (L) 02/22/2020   HCT 32.8 (L) 02/22/2020   PLT 265.0 02/22/2020   GLUCOSE 109 (H) 02/22/2020   CHOL 163 02/22/2020   TRIG 106.0 02/22/2020   HDL 48.10 02/22/2020   LDLDIRECT 126.3 06/06/2011   LDLCALC 94 02/22/2020   ALT 19 02/22/2020   AST 25 02/22/2020   NA 140 02/22/2020   K 4.7 02/22/2020   CL 104 02/22/2020   CREATININE 0.69 02/22/2020   BUN 15 02/22/2020   CO2 30 02/22/2020   TSH 1.92 02/22/2020  HGBA1C 6.7 (H) 02/22/2020   MICROALBUR 1.8 02/22/2020    US THYROID  Result Date: 06/09/2019 CLINICAL DATA:  Remote partial left hemithyroidectomy.  Goiter. EXAM: THYROID ULTRASOUND TECHNIQUE: Ultrasound examination of the thyroid gland and adjacent soft tissues was performed. COMPARISON:  11/11/2005 by report only FINDINGS: Parenchymal Echotexture: Mildly heterogenous Isthmus: 0.3 cm thickness Right lobe: 4.2 x 1.4 x 1.4 cm, previously 5.2 x 2 x 1.7 by report Left lobe: Surgically absent _________________________________________________________ Estimated total number of nodules >/= 1 cm: 1 Number of spongiform nodules >/=  2 cm not described below (TR1): 0 Number of mixed cystic and solid nodules >/= 1.5 cm not described below (TR2): 0 _________________________________________________________ 0.5 cm hypoechoic nodule without calcifications, superior right; This nodule does NOT meet TI-RADS criteria for biopsy or dedicated follow-up. Nodule # 2: Location: Isthmus; right of midline Maximum size: 1 cm; Other 2 dimensions: 0.6 x 0.8 cm Composition: mixed cystic and solid (1) Echogenicity: isoechoic (1) Shape: not taller-than-wide (0) Margins: smooth (0) Echogenic foci: none (0) ACR TI-RADS total points: 2. ACR TI-RADS risk category: TR2 (2 points). ACR TI-RADS recommendations: This nodule does NOT meet TI-RADS criteria for biopsy or dedicated follow-up. _________________________________________________________ IMPRESSION: 1. No  residual/recurrent tissue post left hemithyroidectomy. 2. Small right and isthmic lesions do not meet criteria for biopsy or dedicated imaging follow-up. The above is in keeping with the ACR TI-RADS recommendations - J Am Coll Radiol 2017;14:587-595. Electronically Signed   By: Lucrezia Europe M.D.   On: 06/09/2019 08:09     Assessment & Plan:  Plan  I am having [name] start on pantoprazole. I am also having her maintain her NON FORMULARY, hydrochlorothiazide, Vitamin D (Ergocalciferol), montelukast, ondansetron, PARoxetine, azelastine, varenicline, losartan, blood glucose meter kit and supplies, rosuvastatin, tiZANidine, meloxicam, atomoxetine, levothyroxine, metoprolol succinate, metFORMIN, and gabapentin.  Meds ordered this encounter  Medications  . pantoprazole (PROTONIX) 40 MG tablet    Sig: Take 1 tablet (40 mg total) by mouth daily.    Dispense:  30 tablet    Refill:  3    Problem List Items Addressed This Visit    None    Visit Diagnoses    Hiatal hernia    -  Primary   Relevant Medications   pantoprazole (PROTONIX) 40 MG tablet   Other Relevant Orders   Ambulatory referral to Gastroenterology   Encounter for screening mammogram for malignant neoplasm of breast       Relevant Orders   MM DIAG BREAST TOMO BILATERAL      Follow-up: Return in about 6 months (around 10/07/2020), or if symptoms worsen or fail to improve.  Ann Held, DO

## 2020-04-07 NOTE — Patient Instructions (Signed)
Hiatal Hernia  A hiatal hernia occurs when part of the stomach slides above the muscle that separates the abdomen from the chest (diaphragm). A person can be born with a hiatal hernia (congenital), or it may develop over time. In almost all cases of hiatal hernia, only the top part of the stomach pushes through the diaphragm. Many people have a hiatal hernia with no symptoms. The larger the hernia, the more likely it is that you will have symptoms. In some cases, a hiatal hernia allows stomach acid to flow back into the tube that carries food from your mouth to your stomach (esophagus). This may cause heartburn symptoms. Severe heartburn symptoms may mean that you have developed a condition called gastroesophageal reflux disease (GERD). What are the causes? This condition is caused by a weakness in the opening (hiatus) where the esophagus passes through the diaphragm to attach to the upper part of the stomach. A person may be born with a weakness in the hiatus, or a weakness can develop over time. What increases the risk? This condition is more likely to develop in:  Older people. Age is a major risk factor for a hiatal hernia, especially if you are over the age of 50.  Pregnant women.  People who are overweight.  People who have frequent constipation. What are the signs or symptoms? Symptoms of this condition usually develop in the form of GERD symptoms. Symptoms include:  Heartburn.  Belching.  Indigestion.  Trouble swallowing.  Coughing or wheezing.  Sore throat.  Hoarseness.  Chest pain.  Nausea and vomiting. How is this diagnosed? This condition may be diagnosed during testing for GERD. Tests that may be done include:  X-rays of your stomach or chest.  An upper gastrointestinal (GI) series. This is an X-ray exam of your GI tract that is taken after you swallow a chalky liquid that shows up clearly on the X-ray.  Endoscopy. This is a procedure to look into your stomach  using a thin, flexible tube that has a tiny camera and light on the end of it. How is this treated? This condition may be treated by:  Dietary and lifestyle changes to help reduce GERD symptoms.  Medicines. These may include: ? Over-the-counter antacids. ? Medicines that make your stomach empty more quickly. ? Medicines that block the production of stomach acid (H2 blockers). ? Stronger medicines to reduce stomach acid (proton pump inhibitors).  Surgery to repair the hernia, if other treatments are not helping. If you have no symptoms, you may not need treatment. Follow these instructions at home: Lifestyle and activity  Do not use any products that contain nicotine or tobacco, such as cigarettes and e-cigarettes. If you need help quitting, ask your health care provider.  Try to achieve and maintain a healthy body weight.  Avoid putting pressure on your abdomen. Anything that puts pressure on your abdomen increases the amount of acid that may be pushed up into your esophagus. ? Avoid bending over, especially after eating. ? Raise the head of your bed by putting blocks under the legs. This keeps your head and esophagus higher than your stomach. ? Do not wear tight clothing around your chest or stomach. ? Try not to strain when having a bowel movement, when urinating, or when lifting heavy objects. Eating and drinking  Avoid foods that can worsen GERD symptoms. These may include: ? Fatty foods, like fried foods. ? Citrus fruits, like oranges or lemon. ? Other foods and drinks that contain acid, like   orange juice or tomatoes. ? Spicy food. ? Chocolate.  Eat frequent small meals instead of three large meals a day. This helps prevent your stomach from getting too full. ? Eat slowly. ? Do not lie down right after eating. ? Do not eat 1-2 hours before bed.  Do not drink beverages with caffeine. These include cola, coffee, cocoa, and tea.  Do not drink alcohol. General  instructions  Take over-the-counter and prescription medicines only as told by your health care provider.  Keep all follow-up visits as told by your health care provider. This is important. Contact a health care provider if:  Your symptoms are not controlled with medicines or lifestyle changes.  You are having trouble swallowing.  You have coughing or wheezing that will not go away. Get help right away if:  Your pain is getting worse.  Your pain spreads to your arms, neck, jaw, teeth, or back.  You have shortness of breath.  You sweat for no reason.  You feel sick to your stomach (nauseous) or you vomit.  You vomit blood.  You have bright red blood in your stools.  You have black, tarry stools. This information is not intended to replace advice given to you by your health care provider. Make sure you discuss any questions you have with your health care provider. Document Revised: 09/26/2017 Document Reviewed: 05/19/2017 Elsevier Patient Education  2020 [employer]  

## 2020-04-07 NOTE — Assessment & Plan Note (Signed)
protonix ? Stricture  Refer to GI

## 2020-04-11 ENCOUNTER — Encounter: Payer: Self-pay | Admitting: Gastroenterology

## 2020-05-16 ENCOUNTER — Other Ambulatory Visit (HOSPITAL_COMMUNITY)
Admission: RE | Admit: 2020-05-16 | Discharge: 2020-05-16 | Disposition: A | Discharge status: Home / Self Care | Payer: 59 | Source: Ambulatory Visit | Attending: Family Medicine | Admitting: Family Medicine

## 2020-05-16 ENCOUNTER — Ambulatory Visit (INDEPENDENT_AMBULATORY_CARE_PROVIDER_SITE_OTHER): Payer: 59 | Admitting: Family Medicine

## 2020-05-16 ENCOUNTER — Other Ambulatory Visit: Payer: Self-pay | Admitting: Family Medicine

## 2020-05-16 ENCOUNTER — Encounter: Payer: Self-pay | Admitting: Family Medicine

## 2020-05-16 ENCOUNTER — Other Ambulatory Visit: Payer: Self-pay

## 2020-05-16 VITALS — BP 138/88 | HR 92 | Temp 98.6°F | Resp 16 | Ht 63.0 in | Wt 178.2 lb

## 2020-05-16 DIAGNOSIS — Z0000 Encounter for general adult medical examination without abnormal findings: Secondary | ICD-10-CM

## 2020-05-16 DIAGNOSIS — E785 Hyperlipidemia, unspecified: Secondary | ICD-10-CM

## 2020-05-16 DIAGNOSIS — Z1231 Encounter for screening mammogram for malignant neoplasm of breast: Secondary | ICD-10-CM | POA: Diagnosis not present

## 2020-05-16 DIAGNOSIS — E1165 Type 2 diabetes mellitus with hyperglycemia: Secondary | ICD-10-CM

## 2020-05-16 DIAGNOSIS — L659 Nonscarring hair loss, unspecified: Secondary | ICD-10-CM

## 2020-05-16 DIAGNOSIS — E039 Hypothyroidism, unspecified: Secondary | ICD-10-CM | POA: Diagnosis not present

## 2020-05-16 DIAGNOSIS — Z124 Encounter for screening for malignant neoplasm of cervix: Secondary | ICD-10-CM

## 2020-05-16 DIAGNOSIS — M25511 Pain in right shoulder: Secondary | ICD-10-CM | POA: Diagnosis not present

## 2020-05-16 DIAGNOSIS — E1169 Type 2 diabetes mellitus with other specified complication: Secondary | ICD-10-CM

## 2020-05-16 DIAGNOSIS — E2839 Other primary ovarian failure: Secondary | ICD-10-CM

## 2020-05-16 DIAGNOSIS — T7840XD Allergy, unspecified, subsequent encounter: Secondary | ICD-10-CM | POA: Diagnosis not present

## 2020-05-16 DIAGNOSIS — Z1211 Encounter for screening for malignant neoplasm of colon: Secondary | ICD-10-CM

## 2020-05-16 LAB — CBC WITH DIFFERENTIAL/PLATELET
Basophils Absolute: 0 10*3/uL (ref 0.0–0.1)
Basophils Relative: 0.5 % (ref 0.0–3.0)
Eosinophils Absolute: 0.1 10*3/uL (ref 0.0–0.7)
Eosinophils Relative: 0.9 % (ref 0.0–5.0)
HCT: 33.8 % — ABNORMAL LOW (ref 36.0–46.0)
Hemoglobin: 11.3 g/dL — ABNORMAL LOW (ref 12.0–15.0)
Lymphocytes Relative: 30.7 % (ref 12.0–46.0)
Lymphs Abs: 2 10*3/uL (ref 0.7–4.0)
MCHC: 33.5 g/dL (ref 30.0–36.0)
MCV: 85 fl (ref 78.0–100.0)
Monocytes Absolute: 0.5 10*3/uL (ref 0.1–1.0)
Monocytes Relative: 7.3 % (ref 3.0–12.0)
Neutro Abs: 3.9 10*3/uL (ref 1.4–7.7)
Neutrophils Relative %: 60.6 % (ref 43.0–77.0)
Platelets: 310 10*3/uL (ref 150.0–400.0)
RBC: 3.97 Mil/uL (ref 3.87–5.11)
RDW: 13.3 % (ref 11.5–15.5)
WBC: 6.4 10*3/uL (ref 4.0–10.5)

## 2020-05-16 LAB — COMPREHENSIVE METABOLIC PANEL
ALT: 13 U/L (ref 0–35)
AST: 18 U/L (ref 0–37)
Albumin: 3.9 g/dL (ref 3.5–5.2)
Alkaline Phosphatase: 96 U/L (ref 39–117)
BUN: 16 mg/dL (ref 6–23)
CO2: 29 mEq/L (ref 19–32)
Calcium: 9.5 mg/dL (ref 8.4–10.5)
Chloride: 105 mEq/L (ref 96–112)
Creatinine, Ser: 0.74 mg/dL (ref 0.40–1.20)
GFR: 95.36 mL/min (ref >60.00)
Glucose, Bld: 123 mg/dL — ABNORMAL HIGH (ref 70–99)
Potassium: 4.2 mEq/L (ref 3.5–5.1)
Sodium: 139 mEq/L (ref 135–145)
Total Bilirubin: 0.5 mg/dL (ref 0.2–1.2)
Total Protein: 7.4 g/dL (ref 6.0–8.3)

## 2020-05-16 LAB — HEMOGLOBIN A1C: Hgb A1c MFr Bld: 7 % — ABNORMAL HIGH (ref 4.6–6.5)

## 2020-05-16 LAB — LIPID PANEL
Cholesterol: 190 mg/dL (ref 0–200)
HDL: 44.2 mg/dL (ref >39.00)
LDL Cholesterol: 122 mg/dL — ABNORMAL HIGH (ref 0–99)
NonHDL: 146.19
Total CHOL/HDL Ratio: 4
Triglycerides: 121 mg/dL (ref 0.0–149.0)
VLDL: 24.2 mg/dL (ref 0.0–40.0)

## 2020-05-16 MED ORDER — MONTELUKAST SODIUM 10 MG PO TABS: 10.0000 mg | ORAL_TABLET | Freq: Every day | ORAL | 11 refills | Status: DC

## 2020-05-16 MED ORDER — AZELASTINE HCL 0.1 % NA SOLN: 2.0000 | Freq: Two times a day (BID) | NASAL | 5 refills | Status: DC

## 2020-05-16 NOTE — Progress Notes (Signed)
Subjective:     Debra [name] is a 64 y.o. female and is here for a comprehensive physical exam. The patient reports problems - thinning hair . She also c/o r shoulder pain x a while-- -pain comes and goes and she has trouble sleeping because of it. She also needs labs and f/u dm, chol and bp.   Social History   Socioeconomic History  . Marital status: Legally Separated    Spouse name: Not on file  . Number of children: 3  . Years of education: Not on file  . Highest education level: Not on file  Occupational History  . Occupation: Ship broker  Tobacco Use  . Smoking status: Current Every Day Smoker    Packs/day: 0.75    Years: 44.00    Pack years: 33.00    Types: Cigarettes  . Smokeless tobacco: Never Used  . Tobacco comment: Started Chantix 08/24/2017  Substance and Sexual Activity  . Alcohol use: Yes    Alcohol/week: 0.0 standard drinks    Comment: 2-3 drinks per week (beer/wine)  . Drug use: No  . Sexual activity: Yes    Partners: Male    Birth control/protection: Post-menopausal  Other Topics Concern  . Not on file  Social History Narrative  . Not on file   Social Determinants of Health   Financial Resource Strain:   . Difficulty of Paying Living Expenses:   Food Insecurity:   . Worried About Charity fundraiser in the Last Year:   . Arboriculturist in the Last Year:   Transportation Needs:   . Film/video editor (Medical):   Marland Kitchen Lack of Transportation (Non-Medical):   Physical Activity:   . Days of Exercise per Week:   . Minutes of Exercise per Session:   Stress:   . Feeling of Stress :   Social Connections:   . Frequency of Communication with Friends and Family:   . Frequency of Social Gatherings with Friends and Family:   . Attends Religious Services:   . Active Member of Clubs or Organizations:   . Attends Archivist Meetings:   Marland Kitchen Marital Status:   Intimate Partner Violence:   . Fear of Current or Ex-Partner:   . Emotionally Abused:   Marland Kitchen  Physically Abused:   . Sexually Abused:    Health Maintenance  Topic Date Due  . FOOT EXAM  Never done  . PAP SMEAR-Modifier  02/20/2018  . MAMMOGRAM  11/14/2018  . DEXA SCAN  04/09/2019  . OPHTHALMOLOGY EXAM  05/14/2019  . COLONOSCOPY  07/28/2019  . INFLUENZA VACCINE  05/28/2020  . HEMOGLOBIN A1C  08/23/2020  . TETANUS/TDAP  08/03/2028  . COVID-19 Vaccine  Completed  . Hepatitis C Screening  Completed  . HIV Screening  Completed    The following portions of the patient's history were reviewed and updated as appropriate:  She  has a past medical history of Anemia, Anxiety, Back pain, Chest pain, Constipation, DDD (degenerative disc disease), Depression, Diabetes ([place]), Epilepsy ([place]), GERD (gastroesophageal reflux disease), HLD (hyperlipidemia), HTN (hypertension), Joint pain, Leg edema, Mixed connective tissue disease ([place]), Osteoarthritis, Sleep apnea, Stomach ulcer, Swallowing difficulty, Thyroid disease, and Vitamin D deficiency. She does not have any pertinent problems on file. She  has a past surgical history that includes Carpal tunnel release; Leg Surgery; Cervical spine surgery; and Wrist fracture surgery (10-02-15). Her family history includes Anxiety disorder in her mother; Arthritis in her mother; Breast cancer in her mother; Cancer in  her mother; Colon cancer in her maternal grandfather; Depression in her mother; Diabetes in her sister; Heart disease in her father; Heart disease (age of onset: 73) in her mother; Hyperlipidemia in her mother and sister; Hypertension in her father and sister; Stroke in her father; Thyroid disease in her mother. She  reports that she has been smoking cigarettes. She has a 33.00 pack-year smoking history. She has never used smokeless tobacco. She reports current alcohol use. She reports that she does not use drugs. She has a current medication list which includes the following prescription(s): atomoxetine, blood glucose meter kit and supplies,  gabapentin, hydrochlorothiazide, levothyroxine, losartan, metformin, metoprolol succinate, NON FORMULARY, ondansetron, pantoprazole, paroxetine, rosuvastatin, varenicline, vitamin d (ergocalciferol), azelastine, meloxicam, montelukast, and tizanidine. Current Outpatient Medications on File Prior to Visit  Medication Sig Dispense Refill  . atomoxetine (STRATTERA) 100 MG capsule Take 1 capsule (100 mg total) by mouth daily. 90 capsule 3  . blood glucose meter kit and supplies KIT Dispense based on patient and insurance preference. Use up to four times daily as directed. (FOR ICD-9 250.00, 250.01). 1 each 0  . gabapentin (NEURONTIN) 600 MG tablet Take 1 tablet (600 mg total) by mouth 3 (three) times daily. 270 tablet 1  . hydrochlorothiazide (HYDRODIURIL) 25 MG tablet 1/2 po qd 45 tablet 3  . levothyroxine (SYNTHROID) 50 MCG tablet Take 1 tablet (50 mcg total) by mouth daily. 90 tablet 3  . losartan (COZAAR) 100 MG tablet Take 1 tablet (100 mg total) by mouth daily. 90 tablet 3  . metFORMIN (GLUCOPHAGE) 500 MG tablet Take 1 tablet (500 mg total) by mouth daily with breakfast. 30 tablet 2  . metoprolol succinate (TOPROL-XL) 50 MG 24 hr tablet Take 1 tablet (50 mg total) by mouth 2 (two) times daily. (Patient taking differently: Take 50 mg by mouth daily. ) 180 tablet 1  . NON FORMULARY [facility]  Scar Cream -  Verapamil 10%, Pentoxifylline 5% Apply 1-2 grams to affected area 3-4 times daily Qty. 120 gm 3 refills    . ondansetron (ZOFRAN ODT) 4 MG disintegrating tablet Take 1 tablet (4 mg total) by mouth every 8 (eight) hours as needed for nausea or vomiting. 20 tablet 0  . pantoprazole (PROTONIX) 40 MG tablet Take 1 tablet (40 mg total) by mouth daily. 30 tablet 3  . PARoxetine (PAXIL) 30 MG tablet Take 1 tablet (30 mg total) by mouth daily. 90 tablet 1  . rosuvastatin (CRESTOR) 10 MG tablet Take 1 tablet three times a week. Monday, Wednesday, and Friday. 30 tablet 1  . varenicline (CHANTIX  CONTINUING MONTH PAK) 1 MG tablet Take 1 tablet (1 mg total) by mouth 2 (two) times daily. 60 tablet 2  . Vitamin D, Ergocalciferol, (DRISDOL) 1.25 MG (50000 UT) CAPS capsule Take 1 capsule (50,000 Units total) by mouth every 7 (seven) days. 4 capsule 0  . meloxicam (MOBIC) 7.5 MG tablet Take 1 tablet (7.5 mg total) by mouth daily. (Patient not taking: Reported on 05/16/2020) 30 tablet 0  . tiZANidine (ZANAFLEX) 4 MG capsule Take 1 capsule (4 mg total) by mouth 3 (three) times daily. (Patient not taking: Reported on 05/16/2020) 30 capsule 0   No current facility-administered medications on file prior to visit.   She is allergic to penicillins and codeine..  Review of Systems Review of Systems  Constitutional: Negative for activity change, appetite change and fatigue.  HENT: Negative for hearing loss, congestion, tinnitus and ear discharge.  dentist q80mEyes: Negative for visual  disturbance (see optho q1y -- vision corrected to 20/20 with glasses).  Respiratory: Negative for cough, chest tightness and shortness of breath.   Cardiovascular: Negative for chest pain, palpitations and leg swelling.  Gastrointestinal: Negative for abdominal pain, diarrhea, constipation and abdominal distention.  Genitourinary: Negative for urgency, frequency, decreased urine volume and difficulty urinating.  Musculoskeletal: Negative for back pain,   + r shoulder pain Skin: Negative for color change, pallor and rash.  Neurological: Negative for dizziness, light-headedness, numbness and headaches.  Hematological: Negative for adenopathy. Does not bruise/bleed easily.  Psychiatric/Behavioral: Negative for suicidal ideas, confusion, sleep disturbance, self-injury, dysphoric mood, decreased concentration and agitation.       Objective:    BP 138/88 (BP Location: Right Arm, Patient Position: Sitting, Cuff Size: Large)   Pulse 92   Temp 98.6 F (37 C) (Oral)   Resp 16   Ht _0  (1.6 m)   Wt 178 lb 3.2 oz (80.8  kg)   SpO2 97%   BMI 31.57 kg/m  General appearance: alert, cooperative, appears stated age and no distress Head: Normocephalic, without obvious abnormality, atraumatic Eyes: negative findings: lids and lashes normal, conjunctivae and sclerae normal and pupils equal, round, reactive to light and accomodation Ears: normal TM's and external ear canals both ears Neck: no adenopathy, no carotid bruit, no JVD, supple, symmetrical, trachea midline and thyroid not enlarged, symmetric, no tenderness/mass/nodules Back: symmetric, no curvature. ROM normal. No CVA tenderness. Lungs: clear to auscultation bilaterally Breasts: normal appearance, no masses or tenderness Heart: regular rate and rhythm, S1, S2 normal, no murmur, click, rub or gallop Abdomen: soft, non-tender; bowel sounds normal; no masses,  no organomegaly Pelvic: cervix normal in appearance, external genitalia normal, no adnexal masses or tenderness, no cervical motion tenderness, rectovaginal septum normal, uterus normal size, shape, and consistency, vagina normal without discharge and pap done Extremities: extremities normal, atraumatic, no cyanosis or edema Pulses: 2+ and symmetric Skin: Skin color, texture, turgor normal. No rashes or lesions Lymph nodes: Cervical, supraclavicular, and axillary nodes normal. Neurologic: Alert and oriented X 3, normal strength and tone. Normal symmetric reflexes. Normal coordination and gait    Diabetic Foot Exam - Simple   Simple Foot Form Diabetic Foot exam was performed with the following findings: Yes 05/16/2020 11:37 AM  Visual Inspection No deformities, no ulcerations, no other skin breakdown bilaterally: Yes Sensation Testing Intact to touch and monofilament testing bilaterally: Yes Pulse Check Posterior Tibialis and Dorsalis pulse intact bilaterally: Yes Comments     Assessment:    Healthy female exam.      Plan:     ghm utd Check labs See After Visit Summary for Counseling  Recommendations    1. Allergy, subsequent encounter Refill meds stable - azelastine (ASTELIN) 0.1 % nasal spray; Place 2 sprays into both nostrils 2 (two) times daily. Use in each nostril as directed  Dispense: 30 mL; Refill: 5 - montelukast (SINGULAIR) 10 MG tablet; Take 1 tablet (10 mg total) by mouth at bedtime.  Dispense: 30 tablet; Refill: 11  2. Cervical cancer screening   - Cytology - PAP( [place])  3. Colon cancer screening   - Ambulatory referral to Gastroenterology  4. Encounter for screening mammogram for malignant neoplasm of breast   - MM DIGITAL SCREENING BILATERAL; Future  5. Acute pain of right shoulder No relief with otc  - Ambulatory referral to Sports Medicine  6. Thinning hair Check labs  Consider derm referral  - CBC with Differential/Platelet - Comprehensive metabolic panel -  Thyroid Panel With TSH  7. Hypothyroidism, unspecified type Check labs  - Comprehensive metabolic panel - Thyroid Panel With TSH  8. Uncontrolled type 2 diabetes mellitus with hyperglycemia ([place]) Check labs  hgba1c to be checked , minimize simple carbs. Increase exercise as tolerated. Continue current meds  - Hemoglobin A1c - Comprehensive metabolic panel  9. Hyperlipidemia associated with type 2 diabetes mellitus ([place]) Encouraged heart healthy diet, increase exercise, avoid trans fats, consider a krill oil cap daily - Lipid panel - Comprehensive metabolic panel  10. Estrogen deficiency  - DG Bone Density; Future

## 2020-05-16 NOTE — Patient Instructions (Signed)

## 2020-05-17 ENCOUNTER — Other Ambulatory Visit: Payer: Self-pay | Admitting: Family Medicine

## 2020-05-17 ENCOUNTER — Other Ambulatory Visit: Payer: Self-pay

## 2020-05-17 DIAGNOSIS — R7989 Other specified abnormal findings of blood chemistry: Secondary | ICD-10-CM

## 2020-05-17 LAB — THYROID PANEL WITH TSH
Free Thyroxine Index: 1 — ABNORMAL LOW (ref 1.4–3.8)
T3 Uptake: 46 % — ABNORMAL HIGH (ref 22–35)
T4, Total: 2.1 ug/dL — ABNORMAL LOW (ref 5.1–11.9)
TSH: 1.01 mIU/L (ref 0.40–4.50)

## 2020-05-17 LAB — CYTOLOGY - PAP: Diagnosis: NEGATIVE

## 2020-05-17 MED ORDER — ROSUVASTATIN CALCIUM 20 MG PO TABS: 20.0000 mg | ORAL_TABLET | Freq: Every day | ORAL | 2 refills | Status: DC

## 2020-05-17 MED ORDER — METFORMIN HCL 500 MG PO TABS
500.0000 mg | ORAL_TABLET | Freq: Two times a day (BID) | ORAL | 2 refills | Status: DC
Start: 2020-05-17 — End: 2020-05-19

## 2020-05-19 ENCOUNTER — Other Ambulatory Visit: Payer: Self-pay | Admitting: Family Medicine

## 2020-05-19 ENCOUNTER — Other Ambulatory Visit: Payer: Self-pay

## 2020-05-19 MED ORDER — METFORMIN HCL 500 MG PO TABS: 500.0000 mg | ORAL_TABLET | Freq: Two times a day (BID) | ORAL | 2 refills | Status: DC

## 2020-06-01 ENCOUNTER — Ambulatory Visit (INDEPENDENT_AMBULATORY_CARE_PROVIDER_SITE_OTHER): Payer: 59 | Admitting: Family Medicine

## 2020-06-01 DIAGNOSIS — Z5329 Procedure and treatment not carried out because of patient's decision for other reasons: Secondary | ICD-10-CM

## 2020-06-01 NOTE — Progress Notes (Signed)
NO SHOW

## 2020-06-02 ENCOUNTER — Ambulatory Visit: Payer: 59 | Admitting: Gastroenterology

## 2020-07-13 ENCOUNTER — Other Ambulatory Visit: Payer: Self-pay | Admitting: Family Medicine

## 2020-07-19 ENCOUNTER — Ambulatory Visit: Payer: 59 | Admitting: Internal Medicine

## 2020-08-18 ENCOUNTER — Ambulatory Visit: Payer: 59 | Admitting: Internal Medicine

## 2020-09-14 ENCOUNTER — Other Ambulatory Visit: Payer: Self-pay | Admitting: Family Medicine

## 2020-09-25 ENCOUNTER — Telehealth: Payer: Self-pay | Admitting: Family Medicine

## 2020-09-25 NOTE — Telephone Encounter (Signed)
Patient states she needs a letter stating her disability along with start dates. Patient states she would like the letter to be address as whom it may concern.

## 2020-09-26 NOTE — Telephone Encounter (Signed)
She needs ov to discuss

## 2020-09-26 NOTE — Telephone Encounter (Signed)
Please advise 

## 2020-09-29 NOTE — Telephone Encounter (Signed)
Patient schedule appt for 10/05/20. Patient would like to speak to someone prior to appt

## 2020-10-05 ENCOUNTER — Ambulatory Visit (INDEPENDENT_AMBULATORY_CARE_PROVIDER_SITE_OTHER): Payer: 59 | Admitting: Family Medicine

## 2020-10-05 ENCOUNTER — Other Ambulatory Visit: Payer: Self-pay

## 2020-10-05 VITALS — BP 181/100 | HR 71 | Temp 98.6°F | Resp 16 | Ht 63.0 in | Wt 180.0 lb

## 2020-10-05 DIAGNOSIS — I10 Essential (primary) hypertension: Secondary | ICD-10-CM

## 2020-10-05 DIAGNOSIS — R296 Repeated falls: Secondary | ICD-10-CM

## 2020-10-05 DIAGNOSIS — R531 Weakness: Secondary | ICD-10-CM

## 2020-10-05 DIAGNOSIS — R5383 Other fatigue: Secondary | ICD-10-CM | POA: Diagnosis not present

## 2020-10-05 DIAGNOSIS — R079 Chest pain, unspecified: Secondary | ICD-10-CM | POA: Diagnosis not present

## 2020-10-05 DIAGNOSIS — M791 Myalgia, unspecified site: Secondary | ICD-10-CM | POA: Diagnosis not present

## 2020-10-05 DIAGNOSIS — G4489 Other headache syndrome: Secondary | ICD-10-CM | POA: Diagnosis not present

## 2020-10-05 DIAGNOSIS — N3941 Urge incontinence: Secondary | ICD-10-CM

## 2020-10-05 LAB — POC URINALSYSI DIPSTICK (AUTOMATED)
Bilirubin, UA: NEGATIVE
Blood, UA: NEGATIVE
Glucose, UA: NEGATIVE
Ketones, UA: NEGATIVE
Nitrite, UA: NEGATIVE
Protein, UA: NEGATIVE
Spec Grav, UA: 1.02 (ref 1.010–1.025)
Urobilinogen, UA: 0.2 E.U./dL
pH, UA: 6.5 (ref 5.0–8.0)

## 2020-10-05 MED ORDER — SULFAMETHOXAZOLE-TRIMETHOPRIM 800-160 MG PO TABS: 1.0000 | ORAL_TABLET | Freq: Two times a day (BID) | ORAL | 0 refills | Status: DC

## 2020-10-05 MED ORDER — HYDROCHLOROTHIAZIDE 25 MG PO TABS: ORAL_TABLET | ORAL | 3 refills | Status: DC

## 2020-10-05 NOTE — Patient Instructions (Signed)
Understanding Your Risk for Falls Each year, millions of people have serious injuries from falls. It is important to understand your risk for falling. Talk with your health care provider about your risk and what you can do to lower it. There are actions you can take at home to lower your risk. If you do have a serious fall, make sure you tell your health care provider. Falling once raises your risk for falling again. How can falls affect me? Serious injuries from falls are common. These include:  Broken bones, such as hip fractures.  Head injuries, such as traumatic brain injuries (TBI). Fear of falling can also cause you to avoid activities and stay at home. This can make your muscles weaker and actually raise your risk for a fall. What can increase my risk? There are a number of risk factors that increase your risk for falling. The more risk factors you have, the higher your risk for falling. Serious injuries from a fall most often happen to people older than age 65. Children and young adults ages 20-29 are also at higher risk. Common risk factors include:  Weakness in the lower body.  Lack (deficiency) of vitamin D.  Being generally weak or confused due to long-term (chronic) illness.  Dizziness or balance problems.  Poor vision.  Medicines that cause dizziness or drowsiness. These can include medicines for your blood pressure, heart, anxiety, insomnia, or edema, as well as pain medicines and muscle relaxants. Other risk factors include:  Drinking alcohol.  Having had a fall in the past.  Having depression.  Foot pain or improper footwear.  Working at a dangerous job.  Having any of the following in your home: ? Tripping hazards, such as floor clutter or loose rugs. ? Poor lighting. ? Pets or clutter.  Dementia or memory loss. What actions can I take to lower my risk of falling?     Physical activity Maintain physical fitness. Do strength and balance exercises.  Consider taking a regular class to build strength and balance. Yoga and tai chi are good options. Vision Have your eyes checked every year and your vision prescription updated as needed. Walking aids and footwear  Wear nonskid shoes. Do not wear high heels.  Do not walk around the house in socks or slippers.  Use a cane or walker as told by your health care provider. Home safety  Attach secure railings on both sides of your stairs.  Install grab bars for your tub, shower, and toilet. Use a bath mat in your tub or shower.  Use good lighting in all rooms. Keep a flashlight near your bed.  Make sure there is a clear path from your bed to the bathroom. Use night-lights.  Do not use throw rugs. Make sure all carpeting is taped or tacked down securely.  Remove all clutter from walkways and stairways, including extension cords.  Repair uneven or broken steps.  Avoid walking on icy or slippery surfaces. Walk on the grass instead of on icy or slick sidewalks. Where you can, use ice melt to get rid of ice on walkways.  Use a cordless phone. Questions to ask your health care provider  Can you help me check my risk for a fall?  Do any of my medicines make me more likely to fall?  Should I take a vitamin D supplement?  What exercises can I do to improve my strength and balance?  Should I make an appointment to have my vision checked?  Do I  need a bone density test to check for weak bones or osteoporosis?  Would it help to use a cane or a walker? Where to find more information  Centers for Disease Control and Prevention, STEADI: www.cdc.gov  Community-Based Fall Prevention Programs: www.cdc.gov  National Institute on Aging: go4life.nia.nih.gov Contact a health care provider if:  You fall at home.  You are afraid of falling at home.  You feel weak, drowsy, or dizzy. Summary  People 65 and older are at high risk for falling. However, older people are not the only ones  injured in falls. Children and young adults have a higher-than-normal risk too.  Talk with your health care provider about your risks for falling and how to lower those risks.  Taking certain precautions at home can lower your risk for falling.  If you fall, always tell your health care provider. This information is not intended to replace advice given to you by your health care provider. Make sure you discuss any questions you have with your health care provider. Document Revised: 04/21/2019 Document Reviewed: 04/21/2019 Elsevier Patient Education  2020 [employer]  

## 2020-10-05 NOTE — Progress Notes (Signed)
Patient ID: Debra [name], female    DOB: [birth year]  Age: 64 y.o. MRN: [id number]    Subjective:  Subjective  HPI KARYL SHARRAR presents s/p fall day before Thanksgiving -- -she missed a step and hit the R side of her face on cement    Since she has been very tired ---  Trouble staying awake and trouble with balance-- that was before she fell.  She fell before this once before at a funeral -----the road was uneven and she tripped --she c/o back pain as well  She c/o leg weakness   Review of Systems  Constitutional: Negative for appetite change, diaphoresis, fatigue and unexpected weight change.  Eyes: Negative for pain, redness and visual disturbance.  Respiratory: Negative for cough, chest tightness, shortness of breath and wheezing.   Cardiovascular: Negative for chest pain, palpitations and leg swelling.  Endocrine: Negative for cold intolerance, heat intolerance, polydipsia, polyphagia and polyuria.  Genitourinary: Negative for difficulty urinating, dysuria and frequency.  Musculoskeletal: Positive for arthralgias, back pain and myalgias.  Neurological: Positive for weakness. Negative for dizziness, light-headedness, numbness and headaches.  Psychiatric/Behavioral: Negative.     History Past Medical History:  Diagnosis Date  . Anemia   . Anxiety   . Back pain   . Chest pain   . Constipation   . DDD (degenerative disc disease)   . Depression   . Diabetes (HCC)   . Epilepsy (HCC)   . GERD (gastroesophageal reflux disease)   . HLD (hyperlipidemia)   . HTN (hypertension)   . Joint pain   . Leg edema   . 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 Osteoarthritis   . Sleep apnea   . Stomach ulcer   . Swallowing difficulty   . Thyroid disease    Hypothyroidism  . Vitamin D deficiency     She has a past surgical history that includes Carpal tunnel release; Leg Surgery; Cervical spine surgery; and Wrist fracture surgery (10-02-15).   Her family history  includes Anxiety disorder in her mother; Arthritis in her mother; Breast cancer in her mother; Cancer in her mother; Colon cancer in her maternal grandfather; Depression in her mother; Diabetes in her sister; Heart disease in her father; Heart disease (age of onset: 22) in her mother; Hyperlipidemia in her mother and sister; Hypertension in her father and sister; Stroke in her father; Thyroid disease in her mother.She reports that she has been smoking cigarettes. She has a 33.00 pack-year smoking history. She has never used smokeless tobacco. She reports current alcohol use. She reports that she does not use drugs.  Current Outpatient Medications on File Prior to Visit  Medication Sig Dispense Refill  . atomoxetine (STRATTERA) 100 MG capsule Take 1 capsule (100 mg total) by mouth daily. 90 capsule 3  . azelastine (ASTELIN) 0.1 % nasal spray Place 2 sprays into both nostrils 2 (two) times daily. Use in each nostril as directed 30 mL 5  . blood glucose meter kit and supplies KIT Dispense based on patient and insurance preference. Use up to four times daily as directed. (FOR ICD-9 250.00, 250.01). 1 each 0  . gabapentin (NEURONTIN) 600 MG tablet Take 1 tablet (600 mg total) by mouth 3 (three) times daily. 270 tablet 1  . levothyroxine (SYNTHROID) 50 MCG tablet Take 1 tablet (50 mcg total) by mouth daily. 90 tablet 3  . losartan (COZAAR) 100 MG tablet Take 1 tablet (100 mg total) by mouth daily. 90  tablet 3  . metFORMIN (GLUCOPHAGE) 500 MG tablet Take 1 tablet (500 mg total) by mouth daily with breakfast. 90 tablet 0  . metoprolol succinate (TOPROL-XL) 50 MG 24 hr tablet Take 1 tablet (50 mg total) by mouth 2 (two) times daily. (Patient taking differently: Take 50 mg by mouth daily.) 180 tablet 1  . montelukast (SINGULAIR) 10 MG tablet Take 1 tablet (10 mg total) by mouth at bedtime. 30 tablet 11  . NON FORMULARY [facility]  Scar Cream -  Verapamil 10%, Pentoxifylline 5% Apply 1-2 grams to  affected area 3-4 times daily Qty. 120 gm 3 refills    . pantoprazole (PROTONIX) 40 MG tablet Take 1 tablet (40 mg total) by mouth daily. 30 tablet 3  . PARoxetine (PAXIL) 30 MG tablet Take 1 tablet (30 mg total) by mouth daily. 90 tablet 1  . rosuvastatin (CRESTOR) 20 MG tablet Take 1 tablet (20 mg total) by mouth daily. 30 tablet 2  . varenicline (CHANTIX CONTINUING MONTH PAK) 1 MG tablet Take 1 tablet (1 mg total) by mouth 2 (two) times daily. 60 tablet 2  . Vitamin D, Ergocalciferol, (DRISDOL) 1.25 MG (50000 UT) CAPS capsule Take 1 capsule (50,000 Units total) by mouth every 7 (seven) days. 4 capsule 0   No current facility-administered medications on file prior to visit.     Objective:  Objective  Physical Exam Vitals reviewed.  Constitutional:      Appearance: She is well-developed and well-nourished.  HENT:     Head: Normocephalic and atraumatic.  Eyes:     Extraocular Movements: EOM normal.     Conjunctiva/sclera: Conjunctivae normal.  Neck:     Thyroid: No thyromegaly.     Vascular: No carotid bruit or JVD.  Cardiovascular:     Rate and Rhythm: Normal rate and regular rhythm.     Heart sounds: Normal heart sounds. No murmur heard.   Pulmonary:     Effort: Pulmonary effort is normal. No respiratory distress.     Breath sounds: Normal breath sounds. No wheezing or rales.  Chest:     Chest wall: No tenderness.  Musculoskeletal:        General: No edema.     Cervical back: Normal range of motion and neck supple.  Neurological:     Mental Status: She is alert and oriented to person, place, and time.  Psychiatric:        Mood and Affect: Mood and affect normal.    BP (!) 181/100 (BP Location: Left Arm, Patient Position: Sitting, Cuff Size: Small)   Pulse 71   Temp 98.6 F (37 C) (Oral)   Resp 16   Ht $R'5\' 3"'Br$  (1.6 m)   Wt 180 lb (81.6 kg)   SpO2 99%   BMI 31.89 kg/m  Wt Readings from Last 3 Encounters:  10/06/20 176 lb 8 oz (80.1 kg)  10/05/20 180 lb (81.6 kg)   05/16/20 178 lb 3.2 oz (80.8 kg)     Lab Results  Component Value Date   WBC 5.2 10/05/2020   HGB 11.4 (L) 10/05/2020   HCT 34.3 (L) 10/05/2020   PLT 276.0 10/05/2020   GLUCOSE 70 10/05/2020   CHOL 191 10/05/2020   TRIG 153.0 (H) 10/05/2020   HDL 46.20 10/05/2020   LDLDIRECT 126.3 06/06/2011   LDLCALC 114 (H) 10/05/2020   ALT 11 10/05/2020   AST 18 10/05/2020   NA 140 10/05/2020   K 4.3 10/05/2020   CL 105 10/05/2020   CREATININE  0.70 10/05/2020   BUN 11 10/05/2020   CO2 31 10/05/2020   TSH 3.16 10/05/2020   HGBA1C 7.0 (H) 05/16/2020   MICROALBUR 1.8 02/22/2020    No results found.   Assessment & Plan:  Plan  I have discontinued Truda R. Jovel's ondansetron. I have also changed her hydrochlorothiazide. Additionally, I am having her start on sulfamethoxazole-trimethoprim. Lastly, I am having her maintain her NON FORMULARY, Vitamin D (Ergocalciferol), PARoxetine, varenicline, losartan, blood glucose meter kit and supplies, atomoxetine, levothyroxine, metoprolol succinate, gabapentin, pantoprazole, azelastine, montelukast, rosuvastatin, and metFORMIN.  Meds ordered this encounter  Medications  . hydrochlorothiazide (HYDRODIURIL) 25 MG tablet    Sig: 1 po qd    Dispense:  90 tablet    Refill:  3  . sulfamethoxazole-trimethoprim (BACTRIM DS) 800-160 MG tablet    Sig: Take 1 tablet by mouth 2 (two) times daily.    Dispense:  14 tablet    Refill:  0    Problem List Items Addressed This Visit      Unprioritized   Chest pain   Relevant Orders   EKG 12-Lead (Completed)   Essential hypertension   Relevant Medications   hydrochlorothiazide (HYDRODIURIL) 25 MG tablet   Frequent falls    Refer to neuro Check labs        Relevant Orders   Ambulatory referral to Neurology   Myalgia   Relevant Orders   CBC with Differential/Platelet (Completed)   Lipid panel (Completed)   Comprehensive metabolic panel (Completed)   TSH (Completed)   Vitamin B12 (Completed)    Antinuclear Antib (ANA)   Rheumatoid Factor (Completed)   Sedimentation rate (Completed)   Vitamin D (25 hydroxy) (Completed)   Other fatigue - Primary   Relevant Orders   CBC with Differential/Platelet (Completed)   Lipid panel (Completed)   Comprehensive metabolic panel (Completed)   TSH (Completed)   Vitamin B12 (Completed)   Antinuclear Antib (ANA)   Rheumatoid Factor (Completed)   Sedimentation rate (Completed)   Vitamin D (25 hydroxy) (Completed)   Other headache syndrome    Mri head due to frequent falls Refer to neuro       Relevant Orders   MR Brain Wo Contrast   Urge incontinence of urine    Urine culture pending abx per orders      Relevant Medications   sulfamethoxazole-trimethoprim (BACTRIM DS) 800-160 MG tablet   Other Relevant Orders   POCT Urinalysis Dipstick (Automated) (Completed)   Urine Culture   Weakness   Relevant Orders   Ambulatory referral to Neurology      Follow-up: Return in about 3 weeks (around 10/26/2020), or if symptoms worsen or fail to improve, for hypertension.  Ann Held, DO

## 2020-10-06 ENCOUNTER — Ambulatory Visit (INDEPENDENT_AMBULATORY_CARE_PROVIDER_SITE_OTHER): Payer: 59 | Admitting: Internal Medicine

## 2020-10-06 ENCOUNTER — Encounter: Payer: Self-pay | Admitting: Family Medicine

## 2020-10-06 ENCOUNTER — Encounter: Payer: Self-pay | Admitting: Internal Medicine

## 2020-10-06 VITALS — BP 144/90 | HR 82 | Ht 63.0 in | Wt 176.5 lb

## 2020-10-06 DIAGNOSIS — R5383 Other fatigue: Secondary | ICD-10-CM | POA: Insufficient documentation

## 2020-10-06 DIAGNOSIS — R531 Weakness: Secondary | ICD-10-CM | POA: Insufficient documentation

## 2020-10-06 DIAGNOSIS — L649 Androgenic alopecia, unspecified: Secondary | ICD-10-CM | POA: Diagnosis not present

## 2020-10-06 DIAGNOSIS — N3941 Urge incontinence: Secondary | ICD-10-CM

## 2020-10-06 DIAGNOSIS — R946 Abnormal results of thyroid function studies: Secondary | ICD-10-CM

## 2020-10-06 DIAGNOSIS — G4489 Other headache syndrome: Secondary | ICD-10-CM | POA: Insufficient documentation

## 2020-10-06 DIAGNOSIS — R296 Repeated falls: Secondary | ICD-10-CM | POA: Insufficient documentation

## 2020-10-06 DIAGNOSIS — M791 Myalgia, unspecified site: Secondary | ICD-10-CM | POA: Insufficient documentation

## 2020-10-06 HISTORY — DX: Urge incontinence: N39.41

## 2020-10-06 LAB — CBC WITH DIFFERENTIAL/PLATELET
Basophils Absolute: 0 10*3/uL (ref 0.0–0.1)
Basophils Relative: 0.1 % (ref 0.0–3.0)
Eosinophils Absolute: 0.1 10*3/uL (ref 0.0–0.7)
Eosinophils Relative: 1.3 % (ref 0.0–5.0)
HCT: 34.3 % — ABNORMAL LOW (ref 36.0–46.0)
Hemoglobin: 11.4 g/dL — ABNORMAL LOW (ref 12.0–15.0)
Lymphocytes Relative: 41.9 % (ref 12.0–46.0)
Lymphs Abs: 2.2 10*3/uL (ref 0.7–4.0)
MCHC: 33.3 g/dL (ref 30.0–36.0)
MCV: 83.8 fl (ref 78.0–100.0)
Monocytes Absolute: 0.5 10*3/uL (ref 0.1–1.0)
Monocytes Relative: 10.4 % (ref 3.0–12.0)
Neutro Abs: 2.4 10*3/uL (ref 1.4–7.7)
Neutrophils Relative %: 46.3 % (ref 43.0–77.0)
Platelets: 276 10*3/uL (ref 150.0–400.0)
RBC: 4.09 Mil/uL (ref 3.87–5.11)
RDW: 14.2 % (ref 11.5–15.5)
WBC: 5.2 10*3/uL (ref 4.0–10.5)

## 2020-10-06 LAB — COMPREHENSIVE METABOLIC PANEL
ALT: 11 U/L (ref 0–35)
AST: 18 U/L (ref 0–37)
Albumin: 4 g/dL (ref 3.5–5.2)
Alkaline Phosphatase: 78 U/L (ref 39–117)
BUN: 11 mg/dL (ref 6–23)
CO2: 31 mEq/L (ref 19–32)
Calcium: 9.7 mg/dL (ref 8.4–10.5)
Chloride: 105 mEq/L (ref 96–112)
Creatinine, Ser: 0.7 mg/dL (ref 0.40–1.20)
GFR: 90.9 mL/min (ref >60.00)
Glucose, Bld: 70 mg/dL (ref 70–99)
Potassium: 4.3 mEq/L (ref 3.5–5.1)
Sodium: 140 mEq/L (ref 135–145)
Total Bilirubin: 0.4 mg/dL (ref 0.2–1.2)
Total Protein: 7.6 g/dL (ref 6.0–8.3)

## 2020-10-06 LAB — LIPID PANEL
Cholesterol: 191 mg/dL (ref 0–200)
HDL: 46.2 mg/dL (ref >39.00)
LDL Cholesterol: 114 mg/dL — ABNORMAL HIGH (ref 0–99)
NonHDL: 144.89
Total CHOL/HDL Ratio: 4
Triglycerides: 153 mg/dL — ABNORMAL HIGH (ref 0.0–149.0)
VLDL: 30.6 mg/dL (ref 0.0–40.0)

## 2020-10-06 LAB — VITAMIN B12: Vitamin B-12: 318 pg/mL (ref 211–911)

## 2020-10-06 LAB — URINE CULTURE
MICRO NUMBER:: 11297951
SPECIMEN QUALITY:: ADEQUATE

## 2020-10-06 LAB — TSH: TSH: 3.16 u[IU]/mL (ref 0.35–4.50)

## 2020-10-06 LAB — VITAMIN D 25 HYDROXY (VIT D DEFICIENCY, FRACTURES): VITD: 36.68 ng/mL (ref 30.00–100.00)

## 2020-10-06 LAB — SEDIMENTATION RATE: Sed Rate: 20 mm/hr (ref 0–30)

## 2020-10-06 NOTE — Patient Instructions (Signed)
-   Please stop by the lab today and on Monday

## 2020-10-06 NOTE — Progress Notes (Addendum)
Name: Debra [name]  MRN/ DOB: [id number], Apr 23, 1956    Age/ Sex: 64 y.o., female    PCP: Carollee Herter, Alferd Apa, DO   Reason for Endocrinology Evaluation: Abnormal thyroid function      Date of Initial Endocrinology Evaluation: 10/06/2020     HPI: Debra [name] is a 64 y.o. female with a past medical history of T2DM,and Dyslipidemia . The patient presented for initial endocrinology clinic visit on 10/06/2020 for consultative assistance with her Abnormal thyroid function    She has  been noted with abnormal thyroid function test on 04/2019 with low total T4 at 2.5 mcg/dL (5.1-11.9) but normal TSh at 1.90 uIU/mL .   Of note the pt has been on LT-4 replacement for years    Pt has bee noted with stable weight  Denies constipation  Has mild  depression  and takes anxiety pills  She continues with hair loss    She is on Biotin    Denies local neck symptoms , used to have a goiter at some point   Mother with thyroid disease       HISTORY:  Past Medical History:  Past Medical History:  Diagnosis Date  . Anemia   . Anxiety   . Back pain   . Chest pain   . Constipation   . DDD (degenerative disc disease)   . Depression   . Diabetes ([place])   . Epilepsy ([place])   . GERD (gastroesophageal reflux disease)   . HLD (hyperlipidemia)   . HTN (hypertension)   . Joint pain   . Leg edema   . Mixed connective tissue disease ([place])    with Raynaud's  . Osteoarthritis   . Sleep apnea   . Stomach ulcer   . Swallowing difficulty   . Thyroid disease    Hypothyroidism  . Vitamin D deficiency     Past Surgical History:  Past Surgical History:  Procedure Laterality Date  . CARPAL TUNNEL RELEASE     Bilateral  . CERVICAL SPINE SURGERY     x3  . LEG SURGERY     Right   . WRIST FRACTURE SURGERY  10-02-15   Pt have surgery twice on the same wrist.      Social History:  reports that she has been smoking cigarettes. She has a 33.00 pack-year smoking history. She has  never used smokeless tobacco. She reports current alcohol use. She reports that she does not use drugs.  Family History: family history includes Anxiety disorder in her mother; Arthritis in her mother; Breast cancer in her mother; Cancer in her mother; Colon cancer in her maternal grandfather; Depression in her mother; Diabetes in her sister; Heart disease in her father; Heart disease (age of onset: 3) in her mother; Hyperlipidemia in her mother and sister; Hypertension in her father and sister; Stroke in her father; Thyroid disease in her mother.   HOME MEDICATIONS: Allergies as of 10/06/2020      Reactions   Penicillins    Codeine Rash, Other (See Comments)   dizziness      Medication List       Accurate as of October 06, 2020  7:18 AM. If you have any questions, ask your nurse or doctor.        atomoxetine 100 MG capsule Commonly known as: STRATTERA Take 1 capsule (100 mg total) by mouth daily.   azelastine 0.1 % nasal spray Commonly known as: ASTELIN Place 2 sprays into both nostrils  2 (two) times daily. Use in each nostril as directed   blood glucose meter kit and supplies Kit Dispense based on patient and insurance preference. Use up to four times daily as directed. (FOR ICD-9 250.00, 250.01).   gabapentin 600 MG tablet Commonly known as: NEURONTIN Take 1 tablet (600 mg total) by mouth 3 (three) times daily.   hydrochlorothiazide 25 MG tablet Commonly known as: HYDRODIURIL 1 po qd   levothyroxine 50 MCG tablet Commonly known as: SYNTHROID Take 1 tablet (50 mcg total) by mouth daily.   losartan 100 MG tablet Commonly known as: COZAAR Take 1 tablet (100 mg total) by mouth daily.   metFORMIN 500 MG tablet Commonly known as: GLUCOPHAGE Take 1 tablet (500 mg total) by mouth daily with breakfast.   metoprolol succinate 50 MG 24 hr tablet Commonly known as: TOPROL-XL Take 1 tablet (50 mg total) by mouth 2 (two) times daily. What changed: when to take this    montelukast 10 MG tablet Commonly known as: SINGULAIR Take 1 tablet (10 mg total) by mouth at bedtime.   NON FORMULARY [facility]  Scar Cream -  Verapamil 10%, Pentoxifylline 5% Apply 1-2 grams to affected area 3-4 times daily Qty. 120 gm 3 refills   ondansetron 4 MG disintegrating tablet Commonly known as: Zofran ODT Take 1 tablet (4 mg total) by mouth every 8 (eight) hours as needed for nausea or vomiting.   pantoprazole 40 MG tablet Commonly known as: PROTONIX Take 1 tablet (40 mg total) by mouth daily.   PARoxetine 30 MG tablet Commonly known as: Paxil Take 1 tablet (30 mg total) by mouth daily.   rosuvastatin 20 MG tablet Commonly known as: CRESTOR Take 1 tablet (20 mg total) by mouth daily.   sulfamethoxazole-trimethoprim 800-160 MG tablet Commonly known as: BACTRIM DS Take 1 tablet by mouth 2 (two) times daily.   varenicline 1 MG tablet Commonly known as: Chantix Continuing Month Pak Take 1 tablet (1 mg total) by mouth 2 (two) times daily.   Vitamin D (Ergocalciferol) 1.25 MG (50000 UNIT) Caps capsule Commonly known as: DRISDOL Take 1 capsule (50,000 Units total) by mouth every 7 (seven) days.         REVIEW OF SYSTEMS: A comprehensive ROS was conducted with the patient and is negative except as per HPI   OBJECTIVE:  VS: BP (!) 144/90   Pulse 82   Ht _0  (1.6 m)   Wt 176 lb 8 oz (80.1 kg)   SpO2 98%   BMI 31.27 kg/m    Wt Readings from Last 3 Encounters:  10/05/20 180 lb (81.6 kg)  05/16/20 178 lb 3.2 oz (80.8 kg)  04/07/20 181 lb 3.2 oz (82.2 kg)     EXAM: General: Pt appears well and is in NAD  Neck: General: Supple without adenopathy. Thyroid: Thyroid size normal.  No goiter or nodules appreciated. No thyroid bruit.  Lungs: Clear with good BS bilat with no rales, rhonchi, or wheezes  Heart: Auscultation: RRR.  Abdomen: Normoactive bowel sounds, soft, nontender, without masses or organomegaly palpable  Extremities:  BL LE:  No pretibial edema normal ROM and strength.  Skin: Hair : Female pattern baldness noted  Skin Inspection: No rashes Skin Palpation: Skin temperature, texture, and thickness normal to palpation  Neuro: Cranial nerves: II - XII grossly intact Motor: Normal strength throughout DTRs: 2+ and symmetric in UE without delay in relaxation phase  Mental Status: Judgment, insight: Intact Memory: Intact for recent and remote events  Mood and affect: No depression, anxiety, or agitation     DATA REVIEWED:  Results for Debra [name], Debra [name] (MRN [id number]) as of 10/10/2020 13:14  Ref. Range 10/09/2020 14:44  TSH Latest Ref Range: 0.35 - 4.50 uIU/mL 0.94  T4,Free(Direct) Latest Ref Range: 0.60 - 1.60 ng/dL 0.55 (L)     Results for Debra [name], Debra [name] (MRN [id number]) as of 10/08/2020 15:29  Ref. Range 10/06/2020 10:19  Testosterone, Total, LC-MS-MS Latest Ref Range: 2 - 45 ng/dL 25  Results for Debra [name], Debra [name] (MRN [id number]) as of 10/09/2020 13:33  Ref. Range 10/06/2020 10:19  DHEA-SO4 Latest Ref Range: 12 - 133 mcg/dL 38    ASSESSMENT/PLAN/RECOMMENDATIONS:   1. Hypothyroidism:  - Her TSH is normal, FT4 remains low but as long as her TSh is normal , no changes will be recommended  - I explained to the pt that TSH is the most important indicator of sufficient  LT-4 replacement . Once someone is on LT-4 replacement T4 and t3 lose validity .  - Also, her TSh is on  The low end of normal and trying to increase the levothyroxine is going to push her in to Hyperthyroid phase.  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    F/U PRN     Addendum: Labs discussed with the pt on 10/10/2020 at 1300 .  Signed electronically by: Mack Guise, MD  [facility] Endocrinology  [facility] [address]  [place], [place] [address] Phone: [phone] FAX: [phone]   CC: Claudette [address] [place] [address] Phone: [phone] Fax: [phone]   Return to Endocrinology clinic as below: Future Appointments  Date Time Provider [place]  10/06/2020  9:30 AM Shaquanda Graves, Melanie Crazier, MD [facility] None  10/31/2020  2:20 PM Etter Sjogren Koren Shiver, DO [facility]  11/16/2020  1:00 PM Ann Held, DO [facility]

## 2020-10-06 NOTE — Assessment & Plan Note (Signed)
Refer to neuro Check labs

## 2020-10-06 NOTE — Assessment & Plan Note (Signed)
Mri head due to frequent falls Refer to neuro

## 2020-10-06 NOTE — Assessment & Plan Note (Signed)
Urine culture pending abx per orders

## 2020-10-07 LAB — RHEUMATOID FACTOR: Rheumatoid fact SerPl-aCnc: <14 IU/mL (ref <14)

## 2020-10-07 LAB — ANA: Anti Nuclear Antibody (ANA): NEGATIVE

## 2020-10-09 ENCOUNTER — Other Ambulatory Visit: Payer: Self-pay | Admitting: Family Medicine

## 2020-10-09 ENCOUNTER — Other Ambulatory Visit (INDEPENDENT_AMBULATORY_CARE_PROVIDER_SITE_OTHER): Payer: 59

## 2020-10-09 ENCOUNTER — Other Ambulatory Visit: Payer: Self-pay

## 2020-10-09 DIAGNOSIS — R946 Abnormal results of thyroid function studies: Secondary | ICD-10-CM

## 2020-10-09 DIAGNOSIS — E785 Hyperlipidemia, unspecified: Secondary | ICD-10-CM

## 2020-10-09 LAB — T4, FREE: Free T4: 0.55 ng/dL — ABNORMAL LOW (ref 0.60–1.60)

## 2020-10-09 LAB — TSH: TSH: 0.94 u[IU]/mL (ref 0.35–4.50)

## 2020-10-09 LAB — DHEA-SULFATE: DHEA-SO4: 38 ug/dL (ref 12–133)

## 2020-10-09 LAB — TESTOSTERONE, TOTAL, LC/MS/MS: Testosterone, Total, LC-MS-MS: 25 ng/dL (ref 2–45)

## 2020-10-10 ENCOUNTER — Other Ambulatory Visit: Payer: Self-pay | Admitting: *Deleted

## 2020-10-10 MED ORDER — ROSUVASTATIN CALCIUM 40 MG PO TABS: 40.0000 mg | ORAL_TABLET | Freq: Every day | ORAL | 2 refills | Status: DC

## 2020-10-10 NOTE — Telephone Encounter (Signed)
Patient advised per Dr Etter Sjogren that we can send her for evaluation at [facility].  I realize when talking with her that we sent her for this in the past.  Patient does not remember.  I advised her that we can send her again.  She stated that she will think about it and let us know.

## 2020-10-12 ENCOUNTER — Ambulatory Visit: Payer: 59 | Admitting: Family Medicine

## 2020-10-13 ENCOUNTER — Ambulatory Visit (HOSPITAL_COMMUNITY): Admission: RE | Admit: 2020-10-13 | Payer: 59 | Source: Ambulatory Visit

## 2020-10-25 ENCOUNTER — Other Ambulatory Visit: Payer: Self-pay

## 2020-10-25 ENCOUNTER — Ambulatory Visit (HOSPITAL_COMMUNITY)
Admission: RE | Admit: 2020-10-25 | Discharge: 2020-10-25 | Disposition: A | Discharge status: Home / Self Care | Payer: Medicare Other | Source: Ambulatory Visit | Attending: Family Medicine | Admitting: Family Medicine

## 2020-10-25 DIAGNOSIS — G4489 Other headache syndrome: Secondary | ICD-10-CM | POA: Diagnosis not present

## 2020-10-26 ENCOUNTER — Other Ambulatory Visit: Payer: Self-pay | Admitting: Family Medicine

## 2020-10-26 ENCOUNTER — Telehealth: Payer: Self-pay | Admitting: *Deleted

## 2020-10-26 DIAGNOSIS — R519 Headache, unspecified: Secondary | ICD-10-CM

## 2020-10-26 DIAGNOSIS — G8929 Other chronic pain: Secondary | ICD-10-CM

## 2020-10-26 DIAGNOSIS — R42 Dizziness and giddiness: Secondary | ICD-10-CM

## 2020-10-26 NOTE — Telephone Encounter (Signed)
[facility] called to make sure we had patients MRI.  Can you review to see if it is ok to wait for [place]?  Report is in.

## 2020-10-26 NOTE — Telephone Encounter (Signed)
I have reviewed MRI- not emergent but will need to follow-up with pt re: possible demyelinating diease.  Pt has been referred to neuro but no appt on chart yet. Will send to PCP for management  IMPRESSION: 1. No acute intracranial abnormality. 2. Moderate chronic white matter disease, progressed from prior MRI. Differential diagnosis include chronic microangiopathic changes, demyelinating disease, and other post inflammatory/infectious processes. 3. Susceptibility artifact from upper cervical spine hardware limits evaluation of the upper cervical spine.

## 2020-10-26 NOTE — Telephone Encounter (Signed)
Pt having headaches / dizziness---- neuro referral placed before I went on vacation and again today but there is no appointment yet.  I'm not in the office and I know sherri is not back -- would you be able to help me with this ?  Thanks

## 2020-10-28 DIAGNOSIS — R4189 Other symptoms and signs involving cognitive functions and awareness: Secondary | ICD-10-CM | POA: Insufficient documentation

## 2020-10-28 HISTORY — DX: Other symptoms and signs involving cognitive functions and awareness: R41.89

## 2020-10-30 NOTE — Telephone Encounter (Signed)
[facility] has tried to contact pt 3x. Pt has not answered and they are not able to leave a voicemail as the mailbox is full. I have sent a MyChart message to the pt with the direct ph # for [facility] to call and schedule.

## 2020-10-31 ENCOUNTER — Encounter: Payer: Self-pay | Admitting: Family Medicine

## 2020-10-31 ENCOUNTER — Telehealth (INDEPENDENT_AMBULATORY_CARE_PROVIDER_SITE_OTHER): Payer: 59 | Admitting: Family Medicine

## 2020-10-31 ENCOUNTER — Other Ambulatory Visit: Payer: Self-pay

## 2020-10-31 ENCOUNTER — Encounter: Payer: Self-pay | Admitting: Neurology

## 2020-10-31 DIAGNOSIS — R296 Repeated falls: Secondary | ICD-10-CM

## 2020-10-31 DIAGNOSIS — R350 Frequency of micturition: Secondary | ICD-10-CM

## 2020-10-31 DIAGNOSIS — G8929 Other chronic pain: Secondary | ICD-10-CM | POA: Insufficient documentation

## 2020-10-31 DIAGNOSIS — M25511 Pain in right shoulder: Secondary | ICD-10-CM | POA: Insufficient documentation

## 2020-10-31 DIAGNOSIS — M25512 Pain in left shoulder: Secondary | ICD-10-CM

## 2020-10-31 HISTORY — DX: Pain in right shoulder: M25.511

## 2020-10-31 HISTORY — DX: Other chronic pain: G89.29

## 2020-10-31 MED ORDER — METHOCARBAMOL 500 MG PO TABS: 500.0000 mg | ORAL_TABLET | Freq: Four times a day (QID) | ORAL | 1 refills | Status: DC | PRN

## 2020-10-31 NOTE — Progress Notes (Signed)
Virtual Visit via Video Note  I connected with Debra [name] on 10/31/20 at  2:20 PM EST by a video enabled telemedicine application and verified that I am speaking with the correct person using two identifiers.  Location: Patient: home alone Provider: home    I discussed the limitations of evaluation and management by telemedicine and the availability of in person appointments. The patient expressed understanding and agreed to proceed.  History of Present Illness: Pt is home f/u falls and pains.  She wants to go over MRI brain results    Observations/Objective: There were no vitals filed for this visit.  Pt is in nad C/o b/l shoulder pain R>L --- esp at trap posteriorly No dec rom   Assessment and Plan: 1. Chronic pain of both shoulders Pt does not want to go anywhere right now She will rto if pain does not subside  - methocarbamol (ROBAXIN) 500 MG tablet; Take 1 tablet (500 mg total) by mouth every 6 (six) hours as needed for muscle spasms.  Dispense: 45 tablet; Refill: 1  2. Urinary frequency Recheck urine  - POCT Urinalysis Dipstick (Automated); Future - Urine Culture; Future  3. Frequent falls D/w pt mri brain  Pt will call neuro and schedule app--- r/o MS   Follow Up Instructions:    I discussed the assessment and treatment plan with the patient. The patient was provided an opportunity to ask questions and all were answered. The patient agreed with the plan and demonstrated an understanding of the instructions.   The patient was advised to call back or seek an in-person evaluation if the symptoms worsen or if the condition fails to improve as anticipated.  I provided 25 minutes of non-face-to-face time during this encounter.   [name], DO

## 2020-11-01 NOTE — Telephone Encounter (Signed)
Patient has been scheduled

## 2020-11-16 ENCOUNTER — Ambulatory Visit: Payer: 59 | Admitting: Family Medicine

## 2020-11-24 ENCOUNTER — Ambulatory Visit (HOSPITAL_BASED_OUTPATIENT_CLINIC_OR_DEPARTMENT_OTHER)
Admission: RE | Admit: 2020-11-24 | Discharge: 2020-11-24 | Disposition: A | Discharge status: Home / Self Care | Payer: Medicare Other | Source: Ambulatory Visit | Attending: Family Medicine | Admitting: Family Medicine

## 2020-11-24 ENCOUNTER — Encounter: Payer: Self-pay | Admitting: Family Medicine

## 2020-11-24 ENCOUNTER — Ambulatory Visit (INDEPENDENT_AMBULATORY_CARE_PROVIDER_SITE_OTHER): Payer: Medicare Other | Admitting: Family Medicine

## 2020-11-24 ENCOUNTER — Other Ambulatory Visit: Payer: Self-pay

## 2020-11-24 VITALS — BP 142/90 | HR 70 | Temp 98.4°F | Resp 18 | Ht 63.0 in | Wt 175.8 lb

## 2020-11-24 DIAGNOSIS — E611 Iron deficiency: Secondary | ICD-10-CM | POA: Diagnosis not present

## 2020-11-24 DIAGNOSIS — E669 Obesity, unspecified: Secondary | ICD-10-CM

## 2020-11-24 DIAGNOSIS — L649 Androgenic alopecia, unspecified: Secondary | ICD-10-CM | POA: Diagnosis not present

## 2020-11-24 DIAGNOSIS — R296 Repeated falls: Secondary | ICD-10-CM

## 2020-11-24 DIAGNOSIS — M79606 Pain in leg, unspecified: Secondary | ICD-10-CM

## 2020-11-24 DIAGNOSIS — I700 Atherosclerosis of aorta: Secondary | ICD-10-CM | POA: Diagnosis not present

## 2020-11-24 DIAGNOSIS — I10 Essential (primary) hypertension: Secondary | ICD-10-CM

## 2020-11-24 DIAGNOSIS — E1165 Type 2 diabetes mellitus with hyperglycemia: Secondary | ICD-10-CM | POA: Diagnosis not present

## 2020-11-24 DIAGNOSIS — M545 Low back pain, unspecified: Secondary | ICD-10-CM | POA: Diagnosis not present

## 2020-11-24 DIAGNOSIS — R413 Other amnesia: Secondary | ICD-10-CM

## 2020-11-24 DIAGNOSIS — E039 Hypothyroidism, unspecified: Secondary | ICD-10-CM

## 2020-11-24 DIAGNOSIS — E785 Hyperlipidemia, unspecified: Secondary | ICD-10-CM

## 2020-11-24 DIAGNOSIS — E66811 Obesity, class 1: Secondary | ICD-10-CM

## 2020-11-24 MED ORDER — LEVOTHYROXINE SODIUM 50 MCG PO TABS: 50.0000 ug | ORAL_TABLET | Freq: Every day | ORAL | 3 refills | Status: DC

## 2020-11-24 MED ORDER — LOSARTAN POTASSIUM 100 MG PO TABS: 100.0000 mg | ORAL_TABLET | Freq: Every day | ORAL | 3 refills | Status: DC

## 2020-11-24 MED ORDER — METFORMIN HCL 500 MG PO TABS: 500.0000 mg | ORAL_TABLET | Freq: Every day | ORAL | 1 refills | Status: DC

## 2020-11-24 MED ORDER — METOPROLOL SUCCINATE ER 50 MG PO TB24: 50.0000 mg | ORAL_TABLET | Freq: Two times a day (BID) | ORAL | 1 refills | Status: DC

## 2020-11-24 NOTE — Assessment & Plan Note (Signed)
?   Etiology  Neuro referral pending

## 2020-11-24 NOTE — Assessment & Plan Note (Signed)
Recheck xray today 

## 2020-11-24 NOTE — Progress Notes (Signed)
Patient ID: Debra [name], female    DOB: [birth year]  Age: 65 y.o. MRN: [id number]    Subjective:  Subjective  HPI [name] presents for f/u bp, chol and dm.  She still c/o falling -- neuro app is not until March.  She is c/o back pain  She still c/o losing hair and has not seen derm about it yer-- she will call  No new problems   Review of Systems  Constitutional: Negative for appetite change, diaphoresis, fatigue and unexpected weight change.  Eyes: Negative for pain, redness and visual disturbance.  Respiratory: Negative for cough, chest tightness, shortness of breath and wheezing.   Cardiovascular: Negative for chest pain, palpitations and leg swelling.  Endocrine: Negative for cold intolerance, heat intolerance, polydipsia, polyphagia and polyuria.  Genitourinary: Negative for difficulty urinating, dysuria and frequency.  Musculoskeletal: Positive for back pain.  Neurological: Positive for weakness. Negative for dizziness, light-headedness, numbness and headaches.    History Past Medical History:  Diagnosis Date  . Anemia   . Anxiety   . Back pain   . Chest pain   . Constipation   . DDD (degenerative disc disease)   . Depression   . Diabetes ([place])   . Epilepsy ([place])   . GERD (gastroesophageal reflux disease)   . HLD (hyperlipidemia)   . HTN (hypertension)   . Joint pain   . Leg edema   . 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 Osteoarthritis   . Sleep apnea   . Stomach ulcer   . Swallowing difficulty   . Thyroid disease    Hypothyroidism  . Vitamin D deficiency     She has a past surgical history that includes Carpal tunnel release; Leg Surgery; Cervical spine surgery; and Wrist fracture surgery (10-02-15).   Her family history includes Anxiety disorder in her mother; Arthritis in her mother; Breast cancer in her mother; Cancer in her mother; Colon cancer in her maternal grandfather; Depression in her mother; Diabetes in her sister;  Heart disease in her father; Heart disease (age of onset: 34) in her mother; Hyperlipidemia in her mother and sister; Hypertension in her father and sister; Stroke in her father; Thyroid disease in her mother.She reports that she has been smoking cigarettes. She has a 33.00 pack-year smoking history. She has never used smokeless tobacco. She reports current alcohol use. She reports that she does not use drugs.  Current Outpatient Medications on File Prior to Visit  Medication Sig Dispense Refill  . atomoxetine (STRATTERA) 100 MG capsule Take 1 capsule (100 mg total) by mouth daily. 90 capsule 3  . azelastine (ASTELIN) 0.1 % nasal spray Place 2 sprays into both nostrils 2 (two) times daily. Use in each nostril as directed 30 mL 5  . blood glucose meter kit and supplies KIT Dispense based on patient and insurance preference. Use up to four times daily as directed. (FOR ICD-9 250.00, 250.01). 1 each 0  . gabapentin (NEURONTIN) 600 MG tablet Take 1 tablet (600 mg total) by mouth 3 (three) times daily. 270 tablet 1  . hydrochlorothiazide (HYDRODIURIL) 25 MG tablet 1 po qd 90 tablet 3  . methocarbamol (ROBAXIN) 500 MG tablet Take 1 tablet (500 mg total) by mouth every 6 (six) hours as needed for muscle spasms. 45 tablet 1  . montelukast (SINGULAIR) 10 MG tablet Take 1 tablet (10 mg total) by mouth at bedtime. 30 tablet 11  . NON FORMULARY [facility]  Scar Cream -  Verapamil 10%, Pentoxifylline 5% Apply 1-2 grams to affected area 3-4 times daily Qty. 120 gm 3 refills    . pantoprazole (PROTONIX) 40 MG tablet Take 1 tablet (40 mg total) by mouth daily. 30 tablet 3  . PARoxetine (PAXIL) 30 MG tablet Take 1 tablet (30 mg total) by mouth daily. 90 tablet 1  . rosuvastatin (CRESTOR) 40 MG tablet Take 1 tablet (40 mg total) by mouth daily. 30 tablet 2  . varenicline (CHANTIX CONTINUING MONTH PAK) 1 MG tablet Take 1 tablet (1 mg total) by mouth 2 (two) times daily. 60 tablet 2  . Vitamin D,  Ergocalciferol, (DRISDOL) 1.25 MG (50000 UT) CAPS capsule Take 1 capsule (50,000 Units total) by mouth every 7 (seven) days. 4 capsule 0   No current facility-administered medications on file prior to visit.     Objective:  Objective  Physical Exam Vitals and nursing note reviewed.  Constitutional:      Appearance: She is well-developed and well-nourished.  HENT:     Head: Normocephalic and atraumatic.  Eyes:     Extraocular Movements: EOM normal.     Conjunctiva/sclera: Conjunctivae normal.  Neck:     Thyroid: No thyromegaly.     Vascular: No carotid bruit or JVD.  Cardiovascular:     Rate and Rhythm: Normal rate and regular rhythm.     Heart sounds: Normal heart sounds. No murmur heard.   Pulmonary:     Effort: Pulmonary effort is normal. No respiratory distress.     Breath sounds: Normal breath sounds. No wheezing or rales.  Chest:     Chest wall: No tenderness.  Musculoskeletal:        General: No edema.     Cervical back: Normal range of motion and neck supple.  Neurological:     Mental Status: She is alert and oriented to person, place, and time.  Psychiatric:        Mood and Affect: Mood and affect normal.    BP (!) 142/90 (BP Location: Right Arm, Patient Position: Sitting, Cuff Size: Normal)   Pulse 70   Temp 98.4 F (36.9 C) (Oral)   Resp 18   Ht $R'5\' 3"'ro$  (1.6 m)   Wt 175 lb 12.8 oz (79.7 kg)   SpO2 96%   BMI 31.14 kg/m  Wt Readings from Last 3 Encounters:  11/24/20 175 lb 12.8 oz (79.7 kg)  10/06/20 176 lb 8 oz (80.1 kg)  10/05/20 180 lb (81.6 kg)     Lab Results  Component Value Date   WBC 5.2 10/05/2020   HGB 11.4 (L) 10/05/2020   HCT 34.3 (L) 10/05/2020   PLT 276.0 10/05/2020   GLUCOSE 70 10/05/2020   CHOL 191 10/05/2020   TRIG 153.0 (H) 10/05/2020   HDL 46.20 10/05/2020   LDLDIRECT 126.3 06/06/2011   LDLCALC 114 (H) 10/05/2020   ALT 11 10/05/2020   AST 18 10/05/2020   NA 140 10/05/2020   K 4.3 10/05/2020   CL 105 10/05/2020    CREATININE 0.70 10/05/2020   BUN 11 10/05/2020   CO2 31 10/05/2020   TSH 0.94 10/09/2020   HGBA1C 7.0 (H) 05/16/2020   MICROALBUR 1.8 02/22/2020    MR Brain Wo Contrast  Result Date: 10/25/2020 CLINICAL DATA:  Headache, intracranial hemorrhage suspected. Dizziness. EXAM: MRI HEAD WITHOUT CONTRAST TECHNIQUE: Multiplanar, multiecho pulse sequences of the brain and surrounding structures were obtained without intravenous contrast. COMPARISON:  MRI of the brain January 16, 2013. FINDINGS: Susceptibility artifact from upper cervical  spine hardware limits evaluation of the posterior fossa. Brain: No acute infarction, hemorrhage, hydrocephalus, extra-axial collection or mass lesion. Scattered and confluent foci of T2 hyperintensity are seen within the white matter of the cerebral hemispheres and within the pons, nonspecific, progressed from prior MR. Focus of susceptibility artifact in the right cerebellar hemisphere may represent hemosiderin deposit. New from prior MRI. Vascular: Normal flow voids. Skull and upper cervical spine: Susceptibility artifact limits evaluation of the upper cervical spine. Marrow signal in the clivus and calvarium appear normal. Sinuses/Orbits: Mucous retention cyst in the right maxillary sinus. The orbits are maintained. IMPRESSION: 1. No acute intracranial abnormality. 2. Moderate chronic white matter disease, progressed from prior MRI. Differential diagnosis include chronic microangiopathic changes, demyelinating disease, and other post inflammatory/infectious processes. 3. Susceptibility artifact from upper cervical spine hardware limits evaluation of the upper cervical spine. Electronically Signed   By: Pedro Earls M.D.   On: 10/25/2020 22:40     Assessment & Plan:  Plan  I have discontinued Renay R. Manes's sulfamethoxazole-trimethoprim. I am also having her maintain her NON FORMULARY, Vitamin D (Ergocalciferol), PARoxetine, varenicline, blood glucose  meter kit and supplies, atomoxetine, gabapentin, pantoprazole, azelastine, montelukast, hydrochlorothiazide, rosuvastatin, methocarbamol, losartan, metoprolol succinate, metFORMIN, and levothyroxine.  Meds ordered this encounter  Medications  . losartan (COZAAR) 100 MG tablet    Sig: Take 1 tablet (100 mg total) by mouth daily.    Dispense:  90 tablet    Refill:  3  . metoprolol succinate (TOPROL-XL) 50 MG 24 hr tablet    Sig: Take 1 tablet (50 mg total) by mouth 2 (two) times daily.    Dispense:  180 tablet    Refill:  1  . metFORMIN (GLUCOPHAGE) 500 MG tablet    Sig: Take 1 tablet (500 mg total) by mouth daily with breakfast.    Dispense:  90 tablet    Refill:  1  . levothyroxine (SYNTHROID) 50 MCG tablet    Sig: Take 1 tablet (50 mcg total) by mouth daily.    Dispense:  90 tablet    Refill:  3    Please consider 90 day supplies to promote better adherence    Problem List Items Addressed This Visit      Unprioritized   Aortic atherosclerosis ([place])    Check labs today ldl <70      Relevant Medications   losartan (COZAAR) 100 MG tablet   metoprolol succinate (TOPROL-XL) 50 MG 24 hr tablet   Essential hypertension    Well controlled, no changes to meds. Encouraged heart healthy diet such as the DASH diet and exercise as tolerated.       Relevant Medications   losartan (COZAAR) 100 MG tablet   metoprolol succinate (TOPROL-XL) 50 MG 24 hr tablet   Other Relevant Orders   Lipid panel   CBC with Differential/Platelet   Comprehensive metabolic panel   Microalbumin / creatinine urine ratio   Frequent falls    ? Etiology  Neuro referral pending       Relevant Orders   POCT Urinalysis Dipstick (Automated)   CBC with Differential/Platelet   Comprehensive metabolic panel   Microalbumin / creatinine urine ratio   CK (Creatine Kinase)   DG Lumbar Spine Complete   Hyperlipidemia - Primary    Encouraged heart healthy diet, increase exercise, avoid trans fats, consider a  krill oil cap daily      Relevant Medications   losartan (COZAAR) 100 MG tablet   metoprolol succinate (TOPROL-XL)  50 MG 24 hr tablet   Other Relevant Orders   Lipid panel   CBC with Differential/Platelet   Comprehensive metabolic panel   Microalbumin / creatinine urine ratio   Hypothyroidism    Check labs       Relevant Medications   metoprolol succinate (TOPROL-XL) 50 MG 24 hr tablet   levothyroxine (SYNTHROID) 50 MCG tablet   Low back pain    Recheck xray today       Female pattern baldness    F/u derm       MEMORY LOSS   Relevant Orders   Ambulatory referral to Neuropsychology    Other Visit Diagnoses    Type 2 diabetes mellitus with hyperglycemia, unspecified whether long term insulin use (HCC)       Relevant Medications   losartan (COZAAR) 100 MG tablet   metFORMIN (GLUCOPHAGE) 500 MG tablet   Other Relevant Orders   Hemoglobin A1c   Iron deficiency       Relevant Orders   Iron, TIBC and Ferritin Panel   Obesity (BMI 30.0-34.9)       Relevant Orders   Amb Ref to Medical Weight Management   Low back pain radiating down leg       Relevant Orders   DG Lumbar Spine Complete      Follow-up: Return in about 3 months (around 02/22/2021), or if symptoms worsen or fail to improve, for hypertension, hyperlipidemia, diabetes II.  Ann Held, DO

## 2020-11-24 NOTE — Assessment & Plan Note (Signed)
Encouraged heart healthy diet, increase exercise, avoid trans fats, consider a krill oil cap daily 

## 2020-11-24 NOTE — Assessment & Plan Note (Signed)
Well controlled, no changes to meds. Encouraged heart healthy diet such as the DASH diet and exercise as tolerated.  °

## 2020-11-24 NOTE — Patient Instructions (Signed)

## 2020-11-24 NOTE — Assessment & Plan Note (Signed)
Check labs today ldl <70

## 2020-11-24 NOTE — Assessment & Plan Note (Signed)
Check labs 

## 2020-11-25 LAB — MICROALBUMIN / CREATININE URINE RATIO
Creatinine, Urine: 122 mg/dL (ref 20–275)
Microalb Creat Ratio: 9 mcg/mg creat (ref <30)
Microalb, Ur: 1.1 mg/dL

## 2020-11-25 LAB — CBC WITH DIFFERENTIAL/PLATELET
Absolute Monocytes: 563 cells/uL (ref 200–950)
Basophils Absolute: 13 cells/uL (ref 0–200)
Basophils Relative: 0.2 %
Eosinophils Absolute: 38 cells/uL (ref 15–500)
Eosinophils Relative: 0.6 %
HCT: 33.5 % — ABNORMAL LOW (ref 35.0–45.0)
Hemoglobin: 11.1 g/dL — ABNORMAL LOW (ref 11.7–15.5)
Lymphs Abs: 2547 cells/uL (ref 850–3900)
MCH: 27.9 pg (ref 27.0–33.0)
MCHC: 33.1 g/dL (ref 32.0–36.0)
MCV: 84.2 fL (ref 80.0–100.0)
MPV: 10.2 fL (ref 7.5–12.5)
Monocytes Relative: 8.8 %
Neutro Abs: 3238 cells/uL (ref 1500–7800)
Neutrophils Relative %: 50.6 %
Platelets: 239 10*3/uL (ref 140–400)
RBC: 3.98 10*6/uL (ref 3.80–5.10)
RDW: 12.9 % (ref 11.0–15.0)
Total Lymphocyte: 39.8 %
WBC: 6.4 10*3/uL (ref 3.8–10.8)

## 2020-11-25 LAB — COMPREHENSIVE METABOLIC PANEL
AG Ratio: 1.1 (calc) (ref 1.0–2.5)
ALT: 13 U/L (ref 6–29)
AST: 23 U/L (ref 10–35)
Albumin: 4 g/dL (ref 3.6–5.1)
Alkaline phosphatase (APISO): 85 U/L (ref 37–153)
BUN: 15 mg/dL (ref 7–25)
CO2: 26 mmol/L (ref 20–32)
Calcium: 9.7 mg/dL (ref 8.6–10.4)
Chloride: 107 mmol/L (ref 98–110)
Creat: 0.71 mg/dL (ref 0.50–0.99)
Globulin: 3.8 g/dL (calc) — ABNORMAL HIGH (ref 1.9–3.7)
Glucose, Bld: 91 mg/dL (ref 65–99)
Potassium: 4.2 mmol/L (ref 3.5–5.3)
Sodium: 141 mmol/L (ref 135–146)
Total Bilirubin: 0.7 mg/dL (ref 0.2–1.2)
Total Protein: 7.8 g/dL (ref 6.1–8.1)

## 2020-11-25 LAB — LIPID PANEL
Cholesterol: 176 mg/dL (ref <200)
HDL: 51 mg/dL (ref >=50)
LDL Cholesterol (Calc): 108 mg/dL (calc) — ABNORMAL HIGH
Non-HDL Cholesterol (Calc): 125 mg/dL (calc) (ref <130)
Total CHOL/HDL Ratio: 3.5 (calc) (ref <5.0)
Triglycerides: 77 mg/dL (ref <150)

## 2020-11-25 LAB — IRON,TIBC AND FERRITIN PANEL
%SAT: 18 % (calc) (ref 16–45)
Ferritin: 148 ng/mL (ref 16–288)
Iron: 55 ug/dL (ref 45–160)
TIBC: 299 mcg/dL (calc) (ref 250–450)

## 2020-11-25 LAB — HEMOGLOBIN A1C
Hgb A1c MFr Bld: 7 % of total Hgb — ABNORMAL HIGH (ref <5.7)
Mean Plasma Glucose: 154 mg/dL
eAG (mmol/L): 8.5 mmol/L

## 2020-11-25 LAB — CK: Total CK: 202 U/L — ABNORMAL HIGH (ref 29–143)

## 2020-11-28 ENCOUNTER — Other Ambulatory Visit: Payer: Self-pay | Admitting: Family Medicine

## 2020-11-28 ENCOUNTER — Encounter: Payer: Self-pay | Admitting: Psychology

## 2020-11-28 DIAGNOSIS — M791 Myalgia, unspecified site: Secondary | ICD-10-CM

## 2020-11-30 ENCOUNTER — Other Ambulatory Visit: Payer: Self-pay

## 2020-11-30 DIAGNOSIS — M25511 Pain in right shoulder: Secondary | ICD-10-CM

## 2020-11-30 DIAGNOSIS — G8929 Other chronic pain: Secondary | ICD-10-CM

## 2020-11-30 MED ORDER — METHOCARBAMOL 500 MG PO TABS: 500.0000 mg | ORAL_TABLET | Freq: Two times a day (BID) | ORAL | 3 refills | Status: DC

## 2020-12-12 ENCOUNTER — Telehealth: Payer: Self-pay | Admitting: Family Medicine

## 2020-12-12 NOTE — Telephone Encounter (Signed)
Patient states she is confused with which medication she should be taking. Patient is unable to tell me any name of the meds. Patient refuse appt.

## 2020-12-13 ENCOUNTER — Other Ambulatory Visit: Payer: Self-pay

## 2020-12-13 NOTE — Telephone Encounter (Signed)
Spoke with patient. Pt states she couldn't remember which medication she wasn't supposed to be taking. Pt states having 2 orange pills and states she thinks she has been taking the Crestor (was supposed to hold for 2 weeks). Advised patient to call us back once she able to identify the pills from the bottles and we may need to push out her lab appointment.

## 2020-12-14 ENCOUNTER — Other Ambulatory Visit: Payer: Medicare Other

## 2020-12-25 ENCOUNTER — Other Ambulatory Visit (INDEPENDENT_AMBULATORY_CARE_PROVIDER_SITE_OTHER): Payer: Medicare Other

## 2020-12-25 ENCOUNTER — Other Ambulatory Visit: Payer: Self-pay

## 2020-12-25 DIAGNOSIS — E785 Hyperlipidemia, unspecified: Secondary | ICD-10-CM | POA: Diagnosis not present

## 2020-12-26 LAB — COMPREHENSIVE METABOLIC PANEL
ALT: 13 U/L (ref 0–35)
AST: 19 U/L (ref 0–37)
Albumin: 3.8 g/dL (ref 3.5–5.2)
Alkaline Phosphatase: 79 U/L (ref 39–117)
BUN: 13 mg/dL (ref 6–23)
CO2: 28 mEq/L (ref 19–32)
Calcium: 9.5 mg/dL (ref 8.4–10.5)
Chloride: 105 mEq/L (ref 96–112)
Creatinine, Ser: 0.67 mg/dL (ref 0.40–1.20)
GFR: 91.72 mL/min (ref >60.00)
Glucose, Bld: 110 mg/dL — ABNORMAL HIGH (ref 70–99)
Potassium: 4.1 mEq/L (ref 3.5–5.1)
Sodium: 139 mEq/L (ref 135–145)
Total Bilirubin: 0.6 mg/dL (ref 0.2–1.2)
Total Protein: 7.4 g/dL (ref 6.0–8.3)

## 2020-12-26 LAB — LIPID PANEL
Cholesterol: 195 mg/dL (ref 0–200)
HDL: 47 mg/dL (ref >39.00)
LDL Cholesterol: 126 mg/dL — ABNORMAL HIGH (ref 0–99)
NonHDL: 148.41
Total CHOL/HDL Ratio: 4
Triglycerides: 114 mg/dL (ref 0.0–149.0)
VLDL: 22.8 mg/dL (ref 0.0–40.0)

## 2020-12-27 ENCOUNTER — Other Ambulatory Visit: Payer: Self-pay | Admitting: Family Medicine

## 2020-12-27 DIAGNOSIS — E1169 Type 2 diabetes mellitus with other specified complication: Secondary | ICD-10-CM

## 2020-12-27 DIAGNOSIS — E119 Type 2 diabetes mellitus without complications: Secondary | ICD-10-CM

## 2020-12-27 DIAGNOSIS — E785 Hyperlipidemia, unspecified: Secondary | ICD-10-CM

## 2020-12-28 ENCOUNTER — Ambulatory Visit: Payer: 59 | Admitting: Neurology

## 2021-01-08 ENCOUNTER — Other Ambulatory Visit: Payer: Self-pay | Admitting: *Deleted

## 2021-01-08 MED ORDER — EZETIMIBE 10 MG PO TABS: 10.0000 mg | ORAL_TABLET | Freq: Every day | ORAL | 2 refills | Status: DC

## 2021-01-09 ENCOUNTER — Other Ambulatory Visit: Payer: 59

## 2021-01-12 ENCOUNTER — Telehealth: Payer: Self-pay | Admitting: Family Medicine

## 2021-01-12 NOTE — Telephone Encounter (Signed)
Its ok-- just take it tonight

## 2021-01-12 NOTE — Telephone Encounter (Signed)
Pt should be okay right?

## 2021-01-12 NOTE — Telephone Encounter (Signed)
Patient states she missed her dosage last night ezetimibe (ZETIA) 10 MG tablet  She just want to make sure it is ok

## 2021-01-12 NOTE — Telephone Encounter (Signed)
Pt is aware and voices understanding.

## 2021-01-17 ENCOUNTER — Other Ambulatory Visit: Payer: Self-pay

## 2021-01-17 DIAGNOSIS — I10 Essential (primary) hypertension: Secondary | ICD-10-CM

## 2021-01-17 DIAGNOSIS — E039 Hypothyroidism, unspecified: Secondary | ICD-10-CM

## 2021-01-17 DIAGNOSIS — G8929 Other chronic pain: Secondary | ICD-10-CM

## 2021-01-17 DIAGNOSIS — E1165 Type 2 diabetes mellitus with hyperglycemia: Secondary | ICD-10-CM

## 2021-01-17 MED ORDER — METFORMIN HCL 500 MG PO TABS: 500.0000 mg | ORAL_TABLET | Freq: Every day | ORAL | 1 refills | Status: DC

## 2021-01-17 MED ORDER — METOPROLOL SUCCINATE ER 50 MG PO TB24: 50.0000 mg | ORAL_TABLET | Freq: Two times a day (BID) | ORAL | 1 refills | Status: DC

## 2021-01-17 MED ORDER — GABAPENTIN 600 MG PO TABS: 600.0000 mg | ORAL_TABLET | Freq: Three times a day (TID) | ORAL | 1 refills | Status: DC

## 2021-01-17 MED ORDER — LEVOTHYROXINE SODIUM 50 MCG PO TABS: 50.0000 ug | ORAL_TABLET | Freq: Every day | ORAL | 3 refills | Status: DC

## 2021-01-23 ENCOUNTER — Ambulatory Visit (INDEPENDENT_AMBULATORY_CARE_PROVIDER_SITE_OTHER): Payer: Medicare Other | Admitting: Cardiology

## 2021-01-23 ENCOUNTER — Other Ambulatory Visit: Payer: Self-pay

## 2021-01-23 ENCOUNTER — Encounter: Payer: Self-pay | Admitting: Cardiology

## 2021-01-23 VITALS — BP 122/80 | HR 74 | Ht 63.0 in | Wt 177.2 lb

## 2021-01-23 DIAGNOSIS — I10 Essential (primary) hypertension: Secondary | ICD-10-CM | POA: Diagnosis not present

## 2021-01-23 DIAGNOSIS — Z7189 Other specified counseling: Secondary | ICD-10-CM | POA: Diagnosis not present

## 2021-01-23 DIAGNOSIS — Z716 Tobacco abuse counseling: Secondary | ICD-10-CM

## 2021-01-23 DIAGNOSIS — E7800 Pure hypercholesterolemia, unspecified: Secondary | ICD-10-CM | POA: Diagnosis not present

## 2021-01-23 DIAGNOSIS — I700 Atherosclerosis of aorta: Secondary | ICD-10-CM

## 2021-01-23 DIAGNOSIS — E119 Type 2 diabetes mellitus without complications: Secondary | ICD-10-CM

## 2021-01-23 DIAGNOSIS — F17200 Nicotine dependence, unspecified, uncomplicated: Secondary | ICD-10-CM | POA: Diagnosis not present

## 2021-01-23 DIAGNOSIS — R079 Chest pain, unspecified: Secondary | ICD-10-CM | POA: Diagnosis not present

## 2021-01-23 MED ORDER — ASPIRIN EC 81 MG PO TBEC: 81.0000 mg | DELAYED_RELEASE_TABLET | Freq: Every day | ORAL | 3 refills | Status: DC

## 2021-01-23 NOTE — Patient Instructions (Signed)
Medication Instructions:  Your Physician recommend you continue on your current medication as directed.    *If you need a refill on your cardiac medications before your next appointment, please call your pharmacy*   Lab Work: None   Testing/Procedures: none   Follow-Up: At [facility], you and your health needs are our priority.  As part of our continuing mission to provide you with exceptional heart care, we have created designated Provider Care Teams.  These Care Teams include your primary Cardiologist (physician) and Advanced Practice Providers (APPs -  Physician Assistants and Nurse Practitioners) who all work together to provide you with the care you need, when you need it.  We recommend signing up for the patient portal called "MyChart".  Sign up information is provided on this After Visit Summary.  MyChart is used to connect with patients for Virtual Visits (Telemedicine).  Patients are able to view lab/test results, encounter notes, upcoming appointments, etc.  Non-urgent messages can be sent to your provider as well.   To learn more about what you can do with MyChart, go to [email or web address].    Your next appointment:   6 month(s) @ [address] [place], [place] [address]   The format for your next appointment:   In Person  Provider:   Buford Dresser, MD

## 2021-01-23 NOTE — Progress Notes (Signed)
Cardiology Office Note:    Date:  01/23/2021   ID:  Debra [name], DOB 1956-07-18, MRN [id number]  PCP:  Zola Button, Grayling Congress, DO  Cardiologist:  Jodelle Red, MD  Referring MD: Zola Button, Grayling Congress, *   CC: follow up  History of Present Illness:    Debra [name] is a 65 y.o. female with a hx of hypertension, aortic atherosclerosis, hypothyroidism, tobacco abuse, type II diabetes who is seen for follow up today. I initially met her 12/31/19 as a new consult at the request of [name], * for the evaluation and management of chest pain. Normal myoview 02/2020.  Pertinent history: Has severe pain after myelography, thought it was due to the dye, but denies analphylaxis/contrast reaction.   Today: Under a lot of stress. Having chest pain more frequently, now every day. Comes on when sitting, feels like "something doesn't feel right." Hasn't been eating well, exercising recently. Has a lot of fatigue as well.  Notices that if she eats wrong one day, she feels bad the next day, has strange sensation in her chest, feels tired. Has a history of GERD/hiatal hernia, sometimes notes relationship to food more directly after eating. Has pain in her back too.   Has been off her paxil for some time, feels better when she is on this. Has not been on a statin for some time, placed on ezetimibe 10/2020. Unclear why this was changed, question as to whether this was related to falls. Falling less, but still has dizziness. Doesn't recall other issues with statin. On chart review, appears CK was elevated on crestor (202 11/25/20).   Trying to quit smoking. Asking about Chantix as she has some at home, recommended discussing with her PCP given her other medications. Has called 1-800-quit-now, trying to use other means to quit.  ROS positive for right arm tingling/pain when she sleeps on it. Bilateral foot tingling/numbness intermittently.  Denies shortness of breath at rest or with  normal exertion. No PND, orthopnea, LE edema or unexpected weight gain. No syncope or palpitations.  Past Medical History:  Diagnosis Date  . Anemia   . Anxiety   . Back pain   . Chest pain   . Constipation   . DDD (degenerative disc disease)   . Depression   . Diabetes (HCC)   . Epilepsy (HCC)   . GERD (gastroesophageal reflux disease)   . HLD (hyperlipidemia)   . HTN (hypertension)   . Joint pain   . Leg edema   . 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 Osteoarthritis   . Sleep apnea   . Stomach ulcer   . Swallowing difficulty   . Thyroid disease    Hypothyroidism  . Vitamin D deficiency     Past Surgical History:  Procedure Laterality Date  . CARPAL TUNNEL RELEASE     Bilateral  . CERVICAL SPINE SURGERY     x3  . LEG SURGERY     Right   . WRIST FRACTURE SURGERY  10-02-15   Pt have surgery twice on the same wrist.    Current Medications: Current Outpatient Medications on File Prior to Visit  Medication Sig  . azelastine (ASTELIN) 0.1 % nasal spray Place 2 sprays into both nostrils 2 (two) times daily. Use in each nostril as directed  . blood glucose meter kit and supplies KIT Dispense based on patient and insurance preference. Use up to four times daily as directed. (FOR  ICD-9 250.00, 250.01).  Marland Kitchen ezetimibe (ZETIA) 10 MG tablet Take 1 tablet (10 mg total) by mouth at bedtime.  . gabapentin (NEURONTIN) 600 MG tablet Take 1 tablet (600 mg total) by mouth 3 (three) times daily.  . hydrochlorothiazide (HYDRODIURIL) 25 MG tablet 1 po qd  . levothyroxine (SYNTHROID) 50 MCG tablet Take 1 tablet (50 mcg total) by mouth daily.  Marland Kitchen losartan (COZAAR) 100 MG tablet Take 1 tablet (100 mg total) by mouth daily.  . metFORMIN (GLUCOPHAGE) 500 MG tablet Take 1 tablet (500 mg total) by mouth daily with breakfast.  . methocarbamol (ROBAXIN) 500 MG tablet Take 1 tablet (500 mg total) by mouth in the morning and at bedtime.  . metoprolol succinate (TOPROL-XL) 50 MG  24 hr tablet Take 1 tablet (50 mg total) by mouth 2 (two) times daily.  . montelukast (SINGULAIR) 10 MG tablet Take 1 tablet (10 mg total) by mouth at bedtime.  Debra [name] FORMULARY [facility]  Scar Cream -  Verapamil 10%, Pentoxifylline 5% Apply 1-2 grams to affected area 3-4 times daily Qty. 120 gm 3 refills  . pantoprazole (PROTONIX) 40 MG tablet Take 1 tablet (40 mg total) by mouth daily.  Marland Kitchen PARoxetine (PAXIL) 30 MG tablet Take 1 tablet (30 mg total) by mouth daily.  . Vitamin D, Ergocalciferol, (DRISDOL) 1.25 MG (50000 UT) CAPS capsule Take 1 capsule (50,000 Units total) by mouth every 7 (seven) days.   No current facility-administered medications on file prior to visit.     Allergies:   Penicillins and Codeine   Social History   Tobacco Use  . Smoking status: Current Every Day Smoker    Packs/day: 0.75    Years: 44.00    Pack years: 33.00    Types: Cigarettes  . Smokeless tobacco: Never Used  . Tobacco comment: Started Chantix 08/24/2017  Substance Use Topics  . Alcohol use: Yes    Alcohol/week: 0.0 standard drinks    Comment: 2-3 drinks per week (beer/wine)  . Drug use: No    Family History: family history includes Anxiety disorder in her mother; Arthritis in her mother; Breast cancer in her mother; Cancer in her mother; Colon cancer in her maternal grandfather; Depression in her mother; Diabetes in her sister; Heart disease in her father; Heart disease (age of onset: 40) in her mother; Hyperlipidemia in her mother and sister; Hypertension in her father and sister; Stroke in her father; Thyroid disease in her mother. father has heart issues, deceased. She is not sure of age of onset. Brother sees a heart doctor, unclear reason.  ROS:   Please see the history of present illness.  Additional pertinent ROS otherwise unremarkable.  EKGs/Labs/Other Studies Reviewed:    The following studies were reviewed today: Myoview 03/06/20  Nuclear stress EF: 59%. The left  ventricular ejection fraction is normal (55-65%).  There was no ST segment deviation noted during stress.  This is a low risk study. There is no evidence of ischemia and no evidence of previous infarction.  The study is normal.  EKG:  EKG is personally reviewed.  The ekg ordered today demonstrates normal sinus rhythm at 74 bpm  Recent Labs: 10/09/2020: TSH 0.94 11/24/2020: Hemoglobin 11.1; Platelets 239 12/25/2020: ALT 13; BUN 13; Creatinine, Ser 0.67; Potassium 4.1; Sodium 139  Recent Lipid Panel    Component Value Date/Time   CHOL 195 12/25/2020 1407   CHOL 206 (H) 07/22/2018 1057   TRIG 114.0 12/25/2020 1407   HDL 47.00 12/25/2020 1407  HDL 48 07/22/2018 1057   CHOLHDL 4 12/25/2020 1407   VLDL 22.8 12/25/2020 1407   LDLCALC 126 (H) 12/25/2020 1407   LDLCALC 108 (H) 11/24/2020 1431   LDLDIRECT 126.3 06/06/2011 1533    Physical Exam:    VS:  BP 122/80   Pulse 74   Ht 5\' 3"  (1.6 m)   Wt 177 lb 3.2 oz (80.4 kg)   SpO2 99%   BMI 31.39 kg/m     Wt Readings from Last 3 Encounters:  01/23/21 177 lb 3.2 oz (80.4 kg)  11/24/20 175 lb 12.8 oz (79.7 kg)  10/06/20 176 lb 8 oz (80.1 kg)    GEN: Well nourished, well developed in no acute distress HEENT: Normal, moist mucous membranes NECK: No JVD CARDIAC: regular rhythm, normal S1 and S2, no rubs or gallops. No murmur. VASCULAR: Radial and DP pulses 2+ bilaterally. No carotid bruits RESPIRATORY:  Clear to auscultation without rales, wheezing or rhonchi  ABDOMEN: Soft, non-tender, non-distended MUSCULOSKELETAL:  Ambulates independently SKIN: Warm and dry, no edema NEUROLOGIC:  Alert and oriented x 3. No focal neuro deficits noted. PSYCHIATRIC:  Normal affect   ASSESSMENT:    1. Chest pain, unspecified type   2. Aortic atherosclerosis (HCC)   3. Type 2 diabetes mellitus without complication, without long-term current use of insulin (HCC)   4. Essential hypertension   5. Pure hypercholesterolemia   6. Cardiac risk  counseling   7. Counseling on health promotion and disease prevention   8. Tobacco abuse counseling    PLAN:    chest discomfort -atypical symptoms -reassuring stress test -discussed potential non-cardiac etiologies of her symptoms -discussed red flag warning signs that need immediate medical attention  Tobacco abuse counseling: The patient was counseled on tobacco cessation today for 5 minutes.  Counseling included reviewing the risks of smoking tobacco products, how it impacts the patient's current medical diagnoses and different strategies for quitting.  Pharmacotherapy to aid in tobacco cessation was not prescribed today.  Hypertension: at goal of <130/80 today -continue current medications  Type II diabetes: -A1c 7.0, on metformin -consider SGLT2i or GLP1RA if additional med needed given aortic atherosclerosis  Hypercholesterolemia Aortic atherosclerosis:  -discussed aspirin today, she is amenable -appears she was on rosuvastatin but CK elevated. Now on ezetimibe. Being managed by Dr. 14/10/21. Would consider pravastatin as she has tried this before and tolerated.  Cardiac risk counseling and prevention recommendations: -recommend heart healthy/Mediterranean diet, with whole grains, fruits, vegetable, fish, lean meats, nuts, and olive oil. Limit salt. -recommend moderate walking, 3-5 times/week for 30-50 minutes each session. Aim for at least 150 minutes.week. Goal should be pace of 3 miles/hours, or walking 1.5 miles in 30 minutes -recommend avoidance of tobacco products. Avoid excess alcohol. -ASCVD risk score: The 10-year ASCVD risk score Zola Button DC Denman George., et al., 2013) is: 33.9%   Values used to calculate the score:     Age: 6 years     Sex: Female     Is Non-Hispanic African American: Yes     Diabetic: Yes     Tobacco smoker: Yes     Systolic Blood Pressure: 122 mmHg     Is BP treated: Yes     HDL Cholesterol: 47 mg/dL     Total Cholesterol: 195 mg/dL    Plan for  follow up: 6 mos or sooner as needed  Total time of encounter: 43 minutes total time of encounter, including 27 minutes spent in face-to-face patient care. This time includes  coordination of care and counseling regarding test results and CV risk factor management. Remainder of non-face-to-face time involved reviewing chart documents/testing relevant to the patient encounter and documentation in the medical record.  Buford Dresser, MD, PhD, [facility]   Medication Adjustments/Labs and Tests Ordered: Current medicines are reviewed at length with the patient today.  Concerns regarding medicines are outlined above.  Orders Placed This Encounter  Procedures  . EKG 12-Lead   Meds ordered this encounter  Medications  . aspirin EC 81 MG tablet    Sig: Take 1 tablet (81 mg total) by mouth daily. Swallow whole.    Dispense:  90 tablet    Refill:  3    Patient Instructions  Medication Instructions:  Your Physician recommend you continue on your current medication as directed.    *If you need a refill on your cardiac medications before your next appointment, please call your pharmacy*   Lab Work: None   Testing/Procedures: none   Follow-Up: At [facility], you and your health needs are our priority.  As part of our continuing mission to provide you with exceptional heart care, we have created designated Provider Care Teams.  These Care Teams include your primary Cardiologist (physician) and Advanced Practice Providers (APPs -  Physician Assistants and Nurse Practitioners) who all work together to provide you with the care you need, when you need it.  We recommend signing up for the patient portal called "MyChart".  Sign up information is provided on this After Visit Summary.  MyChart is used to connect with patients for Virtual Visits (Telemedicine).  Patients are able to view lab/test results, encounter notes, upcoming appointments, etc.  Non-urgent  messages can be sent to your provider as well.   To learn more about what you can do with MyChart, go to [email or web address].    Your next appointment:   6 month(s) @ [address] [place], [place] [address]   The format for your next appointment:   In Person  Provider:   Buford Dresser, MD       Signed, Buford Dresser, MD PhD 01/23/2021 1:25 PM    [place]

## 2021-01-30 ENCOUNTER — Other Ambulatory Visit: Payer: Self-pay

## 2021-01-30 ENCOUNTER — Encounter: Payer: Self-pay | Admitting: Psychology

## 2021-01-30 ENCOUNTER — Ambulatory Visit (INDEPENDENT_AMBULATORY_CARE_PROVIDER_SITE_OTHER): Payer: Medicare Other | Admitting: Psychology

## 2021-01-30 ENCOUNTER — Ambulatory Visit: Payer: Medicaid Other | Admitting: Psychology

## 2021-01-30 DIAGNOSIS — R4189 Other symptoms and signs involving cognitive functions and awareness: Secondary | ICD-10-CM

## 2021-01-30 DIAGNOSIS — F331 Major depressive disorder, recurrent, moderate: Secondary | ICD-10-CM

## 2021-01-30 DIAGNOSIS — G4733 Obstructive sleep apnea (adult) (pediatric): Secondary | ICD-10-CM | POA: Diagnosis not present

## 2021-01-30 DIAGNOSIS — M1990 Unspecified osteoarthritis, unspecified site: Secondary | ICD-10-CM | POA: Insufficient documentation

## 2021-01-30 DIAGNOSIS — G3184 Mild cognitive impairment, so stated: Secondary | ICD-10-CM | POA: Insufficient documentation

## 2021-01-30 DIAGNOSIS — E559 Vitamin D deficiency, unspecified: Secondary | ICD-10-CM | POA: Insufficient documentation

## 2021-01-30 DIAGNOSIS — F411 Generalized anxiety disorder: Secondary | ICD-10-CM | POA: Diagnosis not present

## 2021-01-30 HISTORY — DX: Mild cognitive impairment of uncertain or unknown etiology: G31.84

## 2021-01-30 NOTE — Progress Notes (Signed)
   Psychometrician Note   Cognitive testing was administered to [name] by Milana Kidney, B.S. (psychometrist) under the supervision of Dr. Christia Reading, Ph.D., licensed psychologist on 01/30/21. Ms. Bolt did not appear overtly distressed by the testing session per behavioral observation or responses across self-report questionnaires. Rest breaks were offered.    The battery of tests administered was selected by Dr. Christia Reading, Ph.D. with consideration to Ms. Glymph's current level of functioning, the nature of her symptoms, emotional and behavioral responses during interview, level of literacy, observed level of motivation/effort, and the nature of the referral question. This battery was communicated to the psychometrist. Communication between Dr. Christia Reading, Ph.D. and the psychometrist was ongoing throughout the evaluation and Dr. Christia Reading, Ph.D. was immediately accessible at all times. Dr. Christia Reading, Ph.D. provided supervision to the psychometrist on the date of this service to the extent necessary to assure the quality of all services provided.    JAYDEE INGMAN will return within approximately 1-2 weeks for an interactive feedback session with Dr. Melvyn Novas at which time her test performances, clinical impressions, and treatment recommendations will be reviewed in detail. Ms. Bieker understands she can contact our office should she require our assistance before this time.  A total of 165 minutes of billable time were spent face-to-face with Ms. Moyd by the psychometrist. This includes both test administration and scoring time. Billing for these services is reflected in the clinical report generated by Dr. Christia Reading, Ph.D.  This note reflects time spent with the psychometrician and does not include test scores or any clinical interpretations made by Dr. Melvyn Novas. The full report will follow in a separate note.

## 2021-01-30 NOTE — Progress Notes (Signed)
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 surrounded tasks requiring sequencing and following multi-step commands. Assessed expressive language was variable. Phonemic fluency was average, semantic fluency was well below average, and confrontation naming was exceptionally low.     Assessed visuospatial/visuoconstructional abilities were average.    Learning (i.e., encoding) of novel verbal and visual information was exceptionally low across a story-based task but average to above average across all other memory assessments. Spontaneous delayed recall (i.e., retrieval) of previously learned information was again exceptionally low across a story-based task but average across all other memory measures. Retention rates were 65% across a story learning task, 150% across a list learning task, and 100% across a shape learning task. Performance across recognition tasks was average to above average, suggesting evidence for information consolidation.   Results of emotional screening instruments suggested that recent symptoms of generalized anxiety were in the severe range, while symptoms of depression were within the moderate range. A  screening instrument assessing recent sleep quality suggested the presence of mild sleep dysfunction.  Tables of Scores:   Note: This summary of test scores accompanies the interpretive report and should not be considered in isolation without reference to the appropriate sections in the text. Descriptors are based on appropriate normative data and may be adjusted based on clinical judgment. The terms "impaired" and "within normal limits (WNL)" are used when a more specific level of functioning cannot be determined.       Effort Testing:   DESCRIPTOR       ACS Word Choice: --- --- Within Expectation  Dot Counting Test: --- --- Within Expectation  NAB EVI: --- --- Within Expectation  D-KEFS Color Word Effort Index: --- --- Within Expectation       Orientation:      Raw Score Percentile   NAB Orientation, Form 1 29/29 --- ---       Cognitive Screening:           Raw Score Percentile   SLUMS: 23/30 --- ---       Intellectual Functioning:           Standard Score Percentile   Test of Premorbid Functioning: 86 18 Below Average       Memory:          NAB Memory Module, Form 1: Standard Score/ T Score Percentile   Total Memory Index 87 19 Below Average  List Learning       Total Trials  1-3 23/36 (50) 50 Average    List B 4/12 (48) 42 Average    Short Delay Free Recall 4/12 (34) 5 Well Below Average    Long Delay Free Recall 6/12 (44) 27 Average    Retention Percentage 150 (69) 97 Well Above Average    Recognition Discriminability 6 (46) 34 Average  Shape Learning       Total Trials 1-3 19/27 (61) 86 Above Average    Delayed Recall 7/9 (60) 84 Above Average    Retention Percentage 100 (51) 54 Average    Recognition Discriminability 8 (57) 75 Above Average  Story Learning       Immediate Recall 30/80 (25) 1 Exceptionally Low    Delayed Recall 13/40 (29) 2 Exceptionally Low    Retention Percentage 65 (38) 12 Below Average  Daily Living Memory       Immediate Recall 42/51 (49) 46  Average    Delayed Recall 13/17 (45) 31 Average    Retention Percentage 76 (43) 25 Average    Recognition Hits 10/10 (60) 84 Above Average       Attention/Executive Function:          Trail Making Test (TMT): Raw Score (T Score) Percentile     Part A 47 secs.,  0 errors (44) 27 Average    Part B 160 secs.,  1 error (41) 18 Below Average         Scaled Score Percentile   WAIS-IV Coding: 9 37 Average       NAB Attention Module, Form 1: T Score Percentile     Digits Forward 53 62 Average    Digits Backwards 43 25 Average        Scaled Score Percentile   WAIS-IV Similarities: 8 25 Average       D-KEFS Color-Word Interference Test: Raw Score (Scaled Score) Percentile     Color Naming 38 secs. (8) 25 Average    Word Reading 26 secs. (9) 37 Average    Inhibition 107 secs. (3) 1 Exceptionally Low      Total Errors 10 errors (1) <1 Exceptionally Low    Inhibition/Switching 138 secs. (1) <1 Exceptionally Low      Total Errors 4 errors (9) 37 Average       NAB Executive Functions Module, Form 1: T Score Percentile     Judgment 51 54 Average       Language:          Verbal Fluency Test: Raw Score (T Score) Percentile     Phonemic Fluency (FAS) 26 (44) 27 Average    Animal Fluency 8 (31) 3 Well Below Average        NAB Language Module, Form 1: T Score Percentile     Auditory Comprehension 30 2 Well Below Average    Naming 24/31 (25) 1 Exceptionally Low       Visuospatial/Visuoconstruction:      Raw Score Percentile   Clock Drawing: 10/10 --- Within Normal Limits       NAB Spatial Module, Form 1: T Score Percentile     Figure Drawing Copy 52 58 Average        Scaled Score Percentile   WAIS-IV Block Design: 8 25 Average       Mood and Personality:      Raw Score Percentile   Beck Depression Inventory - II: 26 --- Moderate  PROMIS Anxiety Questionnaire: 28 --- Severe       Additional Questionnaires:  Raw Score Percentile   PROMIS Sleep Disturbance Questionnaire: 28  --- Mild   Informed Consent and Coding/Compliance:   The current evaluation represents a clinical evaluation for the purposes previously outlined by the referral source and is in no way reflective of a forensic evaluation.   Ms. Blahnik was provided with a verbal description of the nature and purpose of the present neuropsychological evaluation. Also reviewed were the foreseeable risks and/or discomforts and benefits of the procedure, limits of confidentiality, and mandatory reporting requirements of this provider. The patient was given the opportunity to ask questions and receive answers about the evaluation. Oral consent to participate was provided by the patient.   This evaluation was conducted by Christia Reading, Ph.D., licensed clinical neuropsychologist. Ms. Kirkes completed a clinical interview with Dr. Melvyn Novas, billed as one unit [phone], and 165 minutes of cognitive testing and scoring, billed as one unit [phone] and five additional units 96139. Psychometrist Milana Kidney, B.S., assisted Dr. Melvyn Novas with test administration and scoring procedures. As a separate and discrete service, Dr. Melvyn Novas spent a total of 160 minutes in interpretation and report writing billed as one unit [phone] and two units 96133.

## 2021-02-01 ENCOUNTER — Other Ambulatory Visit: Payer: Self-pay

## 2021-02-01 ENCOUNTER — Telehealth: Payer: Self-pay

## 2021-02-01 DIAGNOSIS — F411 Generalized anxiety disorder: Secondary | ICD-10-CM

## 2021-02-01 MED ORDER — PAROXETINE HCL 30 MG PO TABS: 30.0000 mg | ORAL_TABLET | Freq: Every day | ORAL | 1 refills | Status: DC

## 2021-02-01 NOTE — Telephone Encounter (Signed)
Spoke to pt, refill sent of Paroxetine.

## 2021-02-01 NOTE — Telephone Encounter (Signed)
Caller states that she spoke to the pharmacy to get medications refilled and when she called to see if that had been taken care of she was told that the dr was declining one of her medications.  Additional comment: Caller would like to speak to someone in the office about her prescriptions. Office hours provided

## 2021-02-05 ENCOUNTER — Other Ambulatory Visit: Payer: Self-pay

## 2021-02-05 DIAGNOSIS — F411 Generalized anxiety disorder: Secondary | ICD-10-CM

## 2021-02-05 MED ORDER — PAROXETINE HCL 30 MG PO TABS: 30.0000 mg | ORAL_TABLET | Freq: Every day | ORAL | 1 refills | Status: DC

## 2021-02-06 ENCOUNTER — Other Ambulatory Visit: Payer: Self-pay

## 2021-02-06 ENCOUNTER — Ambulatory Visit (INDEPENDENT_AMBULATORY_CARE_PROVIDER_SITE_OTHER): Payer: Medicare Other | Admitting: Psychology

## 2021-02-06 DIAGNOSIS — F331 Major depressive disorder, recurrent, moderate: Secondary | ICD-10-CM

## 2021-02-06 DIAGNOSIS — R4189 Other symptoms and signs involving cognitive functions and awareness: Secondary | ICD-10-CM

## 2021-02-06 DIAGNOSIS — F411 Generalized anxiety disorder: Secondary | ICD-10-CM | POA: Diagnosis not present

## 2021-02-06 DIAGNOSIS — G4733 Obstructive sleep apnea (adult) (pediatric): Secondary | ICD-10-CM | POA: Diagnosis not present

## 2021-02-06 NOTE — Patient Instructions (Signed)
Receptive language involves the ability to adequately comprehend spoken language by others. Given that this represented an area of weakness, Debra [name] concerns surrounding comprehension appear validated. It will be important for information to be presented in smaller, bite-sized chunks to ensure that she is able to comprehend what is being stated to her. She is also encouraged to paraphrase information back to the speaker to ensure all parties that she heard and understood what was stated. It is important to highlight that in instances where Debra [name] does not adequately comprehend and encode (i.e., learn) new information, she is far less likely to remember this information later. This does not reflect memory dysfunction per se, but trouble learning this information initially.   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 She could also discuss extra test taking time as trouble with comprehension could prolong test taking endeavors.   A combination of medication and psychotherapy has been shown to be most effective at treating symptoms of anxiety and depression. Debra [name] was vague and inconsistent when reporting current medication usage. She stated that she recently stopped Paxil but was trying to resume and that there was a pharmacy error which prevented her from getting this medication. However, medical records suggest that she had stopped taking this medication for a much longer duration than what she described. Regardless, I believe that she would benefit from mood-related medications and she is encouraged to speak with her prescribing physician regarding adjustments to optimally manage these symptoms.   Likewise, Debra [name] is encouraged to consider engaging in short-term psychotherapy to address symptoms of  psychiatric distress. She would benefit from an active and collaborative therapeutic environment, rather than one purely supportive in nature. Recommended treatment modalities include Cognitive Behavioral Therapy (CBT) or Acceptance and Commitment Therapy (ACT).  While Debra [name] reported during interview that she was unaware if she snored and did not report utilizing a CPAP machine, prior medical records suggest a history of obstructive sleep apnea with instructions for her to follow-up with pulmonology and comments that this condition could influence her report of cognitive dysfunction. Untreated sleep apnea will certainly influence memory and other cognitive abilities. If this condition is left untreated, it will increase her risk for stroke, heart attack, and dementia. As such, she is strongly encouraged to follow-up with pulmonology and seek CPAP intervention.   Should cognitive dysfunction not resolve following adequate intervention surrounding mood and sleep factors, a repeat neuropsychological evaluation would be warranted at that time.  Optimal control of vascular risk factors (including safe cardiovascular exercise and adherence to dietary recommendations) is strongly encouraged. Poorly treated/managed diabetes and other cardiovascular ailments will only worsen her overall clinical presentation and will certainly affect cognitive abilities. It would also increase progression of small vessel disease as seen on her most recent brain MRI.   Debra [name] is encouraged to attend to lifestyle factors for brain health (e.g., regular physical exercise, good nutrition habits, regular participation in cognitively-stimulating activities, and general stress management techniques), which are likely to have benefits for both emotional adjustment and cognition. In fact, in addition to promoting good general health, regular exercise incorporating aerobic activities (e.g., brisk walking, jogging, cycling, etc.)  has been demonstrated to be a very effective treatment for depression and stress, with similar efficacy rates to both antidepressant medication and psychotherapy.   Memory can be improved using internal  strategies such as rehearsal, repetition, chunking, mnemonics, association, and imagery. External strategies such as written notes in a consistently used memory journal, visual and nonverbal auditory cues such as a calendar on the refrigerator or appointments with alarm, such as on a cell phone, can also help maximize recall.     Reducing anxiety may also aid in the retrieval of information. When conversing with others, Debra [name] is encouraged to prepare scripts she can use socially when she experiences difficulty with word finding or memory. Such scripts should be brief explanations of the difficulty (e.g., "the word escapes me now") and allow her to move the conversation forward quickly rather than dwelling on the issue.

## 2021-02-06 NOTE — Progress Notes (Signed)
   Neuropsychology Feedback Session Debra [name]. [facility]  Reason for Referral:   Debra [name] a 65 y.o. ambidextrous African-American female referred by Roma Schanz, D.O.,to characterize hercurrent cognitive functioning and assist with diagnostic clarity and treatment planning in the context of subjective cognitive decline and numerous medical and psychiatric comorbidities.   Feedback:   Debra [name] completed a comprehensive neuropsychological evaluation on 01/30/2021. Please refer to that encounter for the full report and recommendations. Briefly, results suggested primary difficulties with both receptive and expressive language. She also exhibited deficits across response inhibition, as well as when learning and later recalling a lengthy story; however, all other tasks assessing executive functioning and memory were appropriate. The etiology of language dysfunction is unclear and likely multifactorial in nature. Her recent brain MRI did not reveal any temporal lobe lesions in either hemisphere which would account for these difficulties. A primary vascular etiology could certainly be considered given her numerous cardiovascular ailments and imaging suggesting progressive small vessel ischemic disease. However, one might expect greater dysfunction surrounding processing speed and attention/concentration with a primary vascular cause. Per medical records, Debra [name] has likely untreated obstructive sleep apnea. She also reported acute symptoms of severe anxiety and moderate depression across mood-related questionnaires. It remains possible that experiences in her day-to-day life, as well as scores on testing, are caused by a combination of significant psychiatric distress and sleep dysfunction, coupled with chronic pain symptoms. I do not see compelling concerns for early-onset Alzheimer's disease or another degenerative condition at the present  time.  Debra [name] was unaccompanied during the current feedback session. Content of the current session focused on the results of her neuropsychological evaluation. Debra [name] was given the opportunity to ask questions and her questions were answered. She was encouraged to reach out should additional questions arise. A copy of her report was mailed at the conclusion of the visit.      25 minutes were spent conducting the current feedback session with Debra [name], billed as one unit [phone].

## 2021-02-27 ENCOUNTER — Ambulatory Visit (INDEPENDENT_AMBULATORY_CARE_PROVIDER_SITE_OTHER): Payer: Medicare Other | Admitting: Family Medicine

## 2021-02-27 ENCOUNTER — Other Ambulatory Visit: Payer: Self-pay

## 2021-02-27 ENCOUNTER — Ambulatory Visit: Payer: Medicare Other | Attending: Internal Medicine

## 2021-02-27 ENCOUNTER — Encounter: Payer: Self-pay | Admitting: Family Medicine

## 2021-02-27 VITALS — BP 100/68 | HR 77 | Temp 98.4°F | Resp 18 | Ht 63.0 in | Wt 171.4 lb

## 2021-02-27 DIAGNOSIS — E538 Deficiency of other specified B group vitamins: Secondary | ICD-10-CM | POA: Diagnosis not present

## 2021-02-27 DIAGNOSIS — E7800 Pure hypercholesterolemia, unspecified: Secondary | ICD-10-CM | POA: Diagnosis not present

## 2021-02-27 DIAGNOSIS — I700 Atherosclerosis of aorta: Secondary | ICD-10-CM | POA: Diagnosis not present

## 2021-02-27 DIAGNOSIS — E1169 Type 2 diabetes mellitus with other specified complication: Secondary | ICD-10-CM

## 2021-02-27 DIAGNOSIS — E785 Hyperlipidemia, unspecified: Secondary | ICD-10-CM

## 2021-02-27 DIAGNOSIS — F331 Major depressive disorder, recurrent, moderate: Secondary | ICD-10-CM

## 2021-02-27 DIAGNOSIS — Z23 Encounter for immunization: Secondary | ICD-10-CM

## 2021-02-27 DIAGNOSIS — E559 Vitamin D deficiency, unspecified: Secondary | ICD-10-CM | POA: Diagnosis not present

## 2021-02-27 DIAGNOSIS — E1165 Type 2 diabetes mellitus with hyperglycemia: Secondary | ICD-10-CM | POA: Diagnosis not present

## 2021-02-27 DIAGNOSIS — I10 Essential (primary) hypertension: Secondary | ICD-10-CM | POA: Diagnosis not present

## 2021-02-27 DIAGNOSIS — F411 Generalized anxiety disorder: Secondary | ICD-10-CM

## 2021-02-27 DIAGNOSIS — Z1211 Encounter for screening for malignant neoplasm of colon: Secondary | ICD-10-CM | POA: Diagnosis not present

## 2021-02-27 DIAGNOSIS — E039 Hypothyroidism, unspecified: Secondary | ICD-10-CM

## 2021-02-27 DIAGNOSIS — Z720 Tobacco use: Secondary | ICD-10-CM | POA: Diagnosis not present

## 2021-02-27 DIAGNOSIS — R5383 Other fatigue: Secondary | ICD-10-CM | POA: Diagnosis not present

## 2021-02-27 MED ORDER — PRAVASTATIN SODIUM 20 MG PO TABS: 20.0000 mg | ORAL_TABLET | Freq: Every day | ORAL | 1 refills | Status: DC

## 2021-02-27 MED ORDER — PAROXETINE HCL 30 MG PO TABS: 30.0000 mg | ORAL_TABLET | Freq: Every day | ORAL | 1 refills | Status: DC

## 2021-02-27 NOTE — Progress Notes (Signed)
Subjective:   By signing my name below, I, Debra [name], attest that this documentation has been prepared under the direction and in the presence of Dr. Seabron Spates, DO. 02/27/2021     Patient ID: Debra [name], female    DOB: 1956/02/23, 65 y.o.   MRN: [id number]  Chief Complaint  Patient presents with  . Hypertension  . Hyperlipidemia  . Diabetes  . Follow-up    HPI Patient is in today for a office visit. She is complaining of dizziness when tilting her head down. She also recently began to experience fatigue. She is taking 10 mg Zetia daily PO to manage her hyperlipidemia. Lab Results  Component Value Date   CHOL 195 12/25/2020   HDL 47.00 12/25/2020   LDLCALC 126 (H) 12/25/2020   LDLDIRECT 126.3 06/06/2011   TRIG 114.0 12/25/2020   CHOLHDL 4 12/25/2020  She reports her blood glucose level yesterday, 02/26/2021, was 120. She is taking 500 mg metformin daily to manage her diabetes. She is requesting to have her a1c measured. Lab Results  Component Value Date   HGBA1C 7.0 (H) 11/24/2020  She denies having any fever, chills, ear pain, congestion, sinus pain, sore throat, eye pain, chest pain, palpations, leg swelling, cough, shortness of breath, wheezing, nausea, vomiting, diarrhea, constipation, blood in stool, dysuria, frequency, hematuria, headaches at this time. She is planning to improve her diet to manage her diabetes and hyperlipidemia. She has not taken her second Covid 19 booster vaccination but is willing to receive it today, 02/27/2021. She is not UTD on her pneumonia vaccination but is willing to receive it today, 02/27/2021. She is not UTD on her colonoscopy and mammogram. She is not UTD on vision. She is UTD on her shingles vaccination.  Past Medical History:  Diagnosis Date  . Abdominal pain, right upper quadrant 04/30/2007  . Allergies 02/03/2019  . Anemia, iron deficiency 04/17/2017  . Aortic atherosclerosis 10/10/2017  . Atypical chest pain  08/31/2014  . Back pain with radiculopathy 01/25/2010   Check xray --- ? Cause of leg weakness  . Chronic pain of both shoulders 10/31/2020  . Constipation   . DDD (degenerative disc disease)   . Diabetes mellitus type II, uncontrolled 01/11/2020  . Diastolic dysfunction 02/15/2010  . Epilepsy    Childhood seziures; pt reports that she grew out of them and they have not occurred since childhood  . Essential hypertension 08/31/2010   Poorly controlled will alter medications, encouraged DASH diet, minimize caffeine and obtain adequate sleep. Report concerning symptoms and follow up as directed and as needed [employer] metoprolol Edema may be c  . Fatigue 01/25/2010  . Generalized anxiety disorder 04/22/2018   Stable co'nt meds  . GERD (gastroesophageal reflux disease) 07/16/2007  . Hearing loss 07/24/2012   Refer to hearing clinic  . Hiatal hernia 07/16/2007  . Hyperglycemia, fasting 05/04/2010  . Hyperlipidemia 04/17/2017   Encouraged heart healthy diet, increase exercise, avoid trans fats, consider a krill oil cap daily  . Hypothyroidism 11/02/2010   con't meds; Lab Results Component Value TSH 1.44 08/12/2017  . Insomnia 04/30/2007  . Knee pain 07/16/2007  . Leg pain, bilateral 11/17/2008  . Low back pain 07/16/2007  . Major depressive disorder 01/30/2018   con't meds F/u counselor  . Mixed connective tissue disease    with Raynaud's  . Obstructive sleep apnea 01/30/2018   F/u with pulm; May be cause of some of her memory/concentration problems  . Osteoarthritis   . Palpitations  02/12/2008  . Pansinusitis 01/30/2018  . Peripheral vertigo 04/30/2007   Resolved rto prn  . 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 Restless legs syndrome 12/01/2008  . Stomach ulcer   . Swallowing difficulty   . Upper respiratory tract infection 12/14/2019  . Urge incontinence of urine 10/06/2020  . Vitamin D deficiency     Past Surgical History:  Procedure Laterality Date  . CARPAL TUNNEL  RELEASE     Bilateral  . CERVICAL SPINE SURGERY     x3  . LEG SURGERY     Right   . WRIST FRACTURE SURGERY  10-02-15   Pt have surgery twice on the same wrist.    Family History  Problem Relation Age of Onset  . Breast cancer Mother        possibly 46's  . Arthritis Mother        rheumatoid  . Cancer Mother        breast  . Heart disease Mother 49       MI  . Hyperlipidemia Mother   . Thyroid disease Mother   . Depression Mother   . Anxiety disorder Mother   . Heart disease Father        MI  . Hypertension Father   . Stroke Father   . Diabetes Sister   . Hypertension Sister   . Hyperlipidemia Sister   . Colon cancer Maternal Grandfather   . Memory loss Maternal Aunt     Social History   Socioeconomic History  . Marital status: Legally Separated    Spouse name: Not on file  . Number of children: 3  . Years of education: 92  . Highest education level: Some college, no degree  Occupational History  . Occupation: Security  Tobacco Use  . Smoking status: Current Every Day Smoker    Packs/day: 0.75    Years: 44.00    Pack years: 33.00    Types: Cigarettes  . Smokeless tobacco: Never Used  Substance and Sexual Activity  . Alcohol use: Yes    Comment: 2-3 drinks per week (beer/wine)  . Drug use: No  . Sexual activity: Yes    Partners: Male    Birth control/protection: Post-menopausal  Other Topics Concern  . Not on file  Social History Narrative  . Not on file   Social Determinants of Health   Financial Resource Strain: Not on file  Food Insecurity: Not on file  Transportation Needs: Not on file  Physical Activity: Not on file  Stress: Not on file  Social Connections: Not on file  Intimate Partner Violence: Not on file    Outpatient Medications Prior to Visit  Medication Sig Dispense Refill  . aspirin EC 81 MG tablet Take 1 tablet (81 mg total) by mouth daily. Swallow whole. 90 tablet 3  . azelastine (ASTELIN) 0.1 % nasal spray Place 2 sprays into  both nostrils 2 (two) times daily. Use in each nostril as directed 30 mL 5  . blood glucose meter kit and supplies KIT Dispense based on patient and insurance preference. Use up to four times daily as directed. (FOR ICD-9 250.00, 250.01). 1 each 0  . ezetimibe (ZETIA) 10 MG tablet Take 1 tablet (10 mg total) by mouth at bedtime. 30 tablet 2  . gabapentin (NEURONTIN) 600 MG tablet Take 1 tablet (600 mg total) by mouth 3 (three) times daily. 270 tablet 1  . hydrochlorothiazide (HYDRODIURIL) 25 MG tablet 1 po qd 90 tablet 3  . levothyroxine (SYNTHROID) 50  MCG tablet Take 1 tablet (50 mcg total) by mouth daily. 90 tablet 3  . losartan (COZAAR) 100 MG tablet Take 1 tablet (100 mg total) by mouth daily. 90 tablet 3  . metFORMIN (GLUCOPHAGE) 500 MG tablet Take 1 tablet (500 mg total) by mouth daily with breakfast. 90 tablet 1  . methocarbamol (ROBAXIN) 500 MG tablet Take 1 tablet (500 mg total) by mouth in the morning and at bedtime. 60 tablet 3  . metoprolol succinate (TOPROL-XL) 50 MG 24 hr tablet Take 1 tablet (50 mg total) by mouth 2 (two) times daily. 180 tablet 1  . montelukast (SINGULAIR) 10 MG tablet Take 1 tablet (10 mg total) by mouth at bedtime. 30 tablet 11  . NON FORMULARY [facility]  Scar Cream -  Verapamil 10%, Pentoxifylline 5% Apply 1-2 grams to affected area 3-4 times daily Qty. 120 gm 3 refills    . pantoprazole (PROTONIX) 40 MG tablet Take 1 tablet (40 mg total) by mouth daily. 30 tablet 3  . Vitamin D, Ergocalciferol, (DRISDOL) 1.25 MG (50000 UT) CAPS capsule Take 1 capsule (50,000 Units total) by mouth every 7 (seven) days. 4 capsule 0  . PARoxetine (PAXIL) 30 MG tablet Take 1 tablet (30 mg total) by mouth daily. 90 tablet 1   No facility-administered medications prior to visit.    Allergies  Allergen Reactions  . Penicillins   . Codeine Rash and Other (See Comments)    dizziness    Review of Systems  Constitutional: Positive for malaise/fatigue. Negative for  chills and fever.  HENT: Negative for congestion, ear pain, sinus pain and sore throat.   Eyes: Negative for pain.  Respiratory: Negative for cough, shortness of breath and wheezing.   Cardiovascular: Negative for chest pain, palpitations and leg swelling.  Gastrointestinal: Negative for blood in stool, constipation, diarrhea, nausea and vomiting.  Genitourinary: Negative for dysuria, frequency and hematuria.  Neurological: Positive for dizziness (Dizziness when tilting head down). Negative for headaches.       Objective:    Physical Exam Constitutional:      Appearance: Normal appearance.  HENT:     Head: Normocephalic and atraumatic.     Right Ear: External ear normal.     Left Ear: External ear normal.  Eyes:     Extraocular Movements: Extraocular movements intact.     Pupils: Pupils are equal, round, and reactive to light.  Cardiovascular:     Rate and Rhythm: Normal rate and regular rhythm.     Pulses: Normal pulses.     Heart sounds: Normal heart sounds.  Pulmonary:     Effort: Pulmonary effort is normal.     Breath sounds: Normal breath sounds.  Abdominal:     General: Bowel sounds are normal.  Skin:    General: Skin is warm and dry.  Neurological:     Mental Status: She is alert and oriented to person, place, and time.  Psychiatric:        Behavior: Behavior normal.    Diabetic Foot Exam - Simple   Simple Foot Form Diabetic Foot exam was performed with the following findings: Yes 02/28/2021  8:33 AM  Visual Inspection No deformities, no ulcerations, no other skin breakdown bilaterally: Yes Sensation Testing Intact to touch and monofilament testing bilaterally: Yes Pulse Check Posterior Tibialis and Dorsalis pulse intact bilaterally: Yes Comments      BP 100/68 (BP Location: Right Arm, Patient Position: Sitting, Cuff Size: Normal)   Pulse 77   Temp 98.4  F (36.9 C) (Oral)   Resp 18   Ht $R'5\' 3"'GV$  (1.6 m)   Wt 171 lb 6.4 oz (77.7 kg)   SpO2 96%   BMI  30.36 kg/m  Wt Readings from Last 3 Encounters:  02/27/21 171 lb 6.4 oz (77.7 kg)  01/23/21 177 lb 3.2 oz (80.4 kg)  11/24/20 175 lb 12.8 oz (79.7 kg)    Diabetic Foot Exam - Simple   Simple Foot Form Diabetic Foot exam was performed with the following findings: Yes 02/28/2021  8:33 AM  Visual Inspection No deformities, no ulcerations, no other skin breakdown bilaterally: Yes Sensation Testing Intact to touch and monofilament testing bilaterally: Yes Pulse Check Posterior Tibialis and Dorsalis pulse intact bilaterally: Yes Comments    Lab Results  Component Value Date   WBC 6.4 11/24/2020   HGB 11.1 (L) 11/24/2020   HCT 33.5 (L) 11/24/2020   PLT 239 11/24/2020   GLUCOSE 110 (H) 12/25/2020   CHOL 195 12/25/2020   TRIG 114.0 12/25/2020   HDL 47.00 12/25/2020   LDLDIRECT 126.3 06/06/2011   LDLCALC 126 (H) 12/25/2020   ALT 13 12/25/2020   AST 19 12/25/2020   NA 139 12/25/2020   K 4.1 12/25/2020   CL 105 12/25/2020   CREATININE 0.67 12/25/2020   BUN 13 12/25/2020   CO2 28 12/25/2020   TSH 0.94 10/09/2020   HGBA1C 7.0 (H) 11/24/2020   MICROALBUR 1.1 11/24/2020    Lab Results  Component Value Date   TSH 0.94 10/09/2020   Lab Results  Component Value Date   WBC 6.4 11/24/2020   HGB 11.1 (L) 11/24/2020   HCT 33.5 (L) 11/24/2020   MCV 84.2 11/24/2020   PLT 239 11/24/2020   Lab Results  Component Value Date   NA 139 12/25/2020   K 4.1 12/25/2020   CO2 28 12/25/2020   GLUCOSE 110 (H) 12/25/2020   BUN 13 12/25/2020   CREATININE 0.67 12/25/2020   BILITOT 0.6 12/25/2020   ALKPHOS 79 12/25/2020   AST 19 12/25/2020   ALT 13 12/25/2020   PROT 7.4 12/25/2020   ALBUMIN 3.8 12/25/2020   CALCIUM 9.5 12/25/2020   ANIONGAP 11 08/31/2014   GFR 91.72 12/25/2020   Lab Results  Component Value Date   CHOL 195 12/25/2020   Lab Results  Component Value Date   HDL 47.00 12/25/2020   Lab Results  Component Value Date   LDLCALC 126 (H) 12/25/2020   Lab Results   Component Value Date   TRIG 114.0 12/25/2020   Lab Results  Component Value Date   CHOLHDL 4 12/25/2020   Lab Results  Component Value Date   HGBA1C 7.0 (H) 11/24/2020       Assessment & Plan:   Problem List Items Addressed This Visit      Unprioritized   Aortic atherosclerosis    On aspirin , zetia and statin      Relevant Medications   pravastatin (PRAVACHOL) 20 MG tablet   Diabetes mellitus type II, uncontrolled - Primary    Check labs today hgba1c to be checked minimize simple carbs. Increase exercise as tolerated. Continue current meds       Relevant Medications   pravastatin (PRAVACHOL) 20 MG tablet   Other Relevant Orders   Lipid panel   Hemoglobin A1c   Comprehensive metabolic panel   Amb ref to Medical Nutrition Therapy-MNT   Ambulatory referral to Endocrinology   Essential hypertension    Well controlled, no changes to meds. Encouraged heart  healthy diet such as the DASH diet and exercise as tolerated.       Relevant Medications   pravastatin (PRAVACHOL) 20 MG tablet   Fatigue    Ongoing concern Check labs today --- bp slightly low but pt states it runs higher at home      Generalized anxiety disorder   Relevant Medications   PARoxetine (PAXIL) 30 MG tablet   Hyperlipidemia    Encouraged heart healthy diet, increase exercise, avoid trans fats, consider a krill oil cap daily      Relevant Medications   pravastatin (PRAVACHOL) 20 MG tablet   Hypothyroidism    Lab Results  Component Value Date   TSH 0.94 10/09/2020   Check labs today con't synthroid      Relevant Orders   TSH   Major depressive disorder    Stable  con't meds      Relevant Medications   PARoxetine (PAXIL) 30 MG tablet   Tobacco abuse    Had taken chantix in the past--- no longer available      Vitamin D deficiency   Relevant Orders   Vitamin B12   Vitamin D (25 hydroxy)    Other Visit Diagnoses    Primary hypertension       Relevant Medications    pravastatin (PRAVACHOL) 20 MG tablet   Other Relevant Orders   Lipid panel   Hemoglobin A1c   Comprehensive metabolic panel   Amb ref to Medical Nutrition Therapy-MNT   Hyperlipidemia associated with type 2 diabetes mellitus (HCC)       Relevant Medications   pravastatin (PRAVACHOL) 20 MG tablet   Other Relevant Orders   Lipid panel   Hemoglobin A1c   Comprehensive metabolic panel   Amb ref to Medical Nutrition Therapy-MNT   Need for pneumococcal vaccination       Relevant Orders   Pneumococcal polysaccharide vaccine 23-valent greater than or equal to 2yo subcutaneous/IM (Completed)   Colon cancer screening       Relevant Orders   Ambulatory referral to Gastroenterology   Vitamin B12 deficiency       Relevant Orders   Vitamin B12   Vitamin D (25 hydroxy)       Meds ordered this encounter  Medications  . pravastatin (PRAVACHOL) 20 MG tablet    Sig: Take 1 tablet (20 mg total) by mouth daily.    Dispense:  90 tablet    Refill:  1  . PARoxetine (PAXIL) 30 MG tablet    Sig: Take 1 tablet (30 mg total) by mouth daily.    Dispense:  90 tablet    Refill:  1    I, Ann Held, DO, personally preformed the services described in this documentation.  All medical record entries made by the scribe were at my direction and in my presence.  I have reviewed the chart and discharge instructions (if applicable) and agree that the record reflects my personal performance and is accurate and complete. 02/27/2021   I,Debra [name],acting as a scribe for Ann Held, DO.,have documented all relevant documentation on the behalf of Ann Held, DO,as directed by  Ann Held, DO while in the presence of Ann Held, DO.   Ann Held, DO

## 2021-02-27 NOTE — Patient Instructions (Signed)

## 2021-02-27 NOTE — Progress Notes (Signed)
   Covid-19 Vaccination Clinic  Name:  Debra [name]    MRN: [id number] DOB: October 12, 1956  02/27/2021  [name] was observed post Covid-19 immunization for 15 minutes without incident. She was provided with Vaccine Information Sheet and instruction to access the V-Safe system.   [name] was instructed to call 911 with any severe reactions post vaccine: Marland Kitchen Difficulty breathing  . Swelling of face and throat  . A fast heartbeat  . A bad rash all over body  . Dizziness and weakness   Immunizations Administered    Name Date Dose VIS Date Route   PFIZER Comrnaty(Gray TOP) Covid-19 Vaccine 02/27/2021  2:33 PM 0.3 mL 10/05/2020 Intramuscular   Manufacturer: Ali [employer]uk   Lot: [id number]   NDC: [phone]

## 2021-02-28 ENCOUNTER — Encounter: Payer: Self-pay | Admitting: Family Medicine

## 2021-02-28 LAB — COMPREHENSIVE METABOLIC PANEL
ALT: 9 U/L (ref 0–35)
AST: 15 U/L (ref 0–37)
Albumin: 3.9 g/dL (ref 3.5–5.2)
Alkaline Phosphatase: 94 U/L (ref 39–117)
BUN: 17 mg/dL (ref 6–23)
CO2: 29 mEq/L (ref 19–32)
Calcium: 9.7 mg/dL (ref 8.4–10.5)
Chloride: 105 mEq/L (ref 96–112)
Creatinine, Ser: 0.72 mg/dL (ref 0.40–1.20)
GFR: 87.63 mL/min (ref >60.00)
Glucose, Bld: 124 mg/dL — ABNORMAL HIGH (ref 70–99)
Potassium: 3.9 mEq/L (ref 3.5–5.1)
Sodium: 139 mEq/L (ref 135–145)
Total Bilirubin: 0.5 mg/dL (ref 0.2–1.2)
Total Protein: 7.4 g/dL (ref 6.0–8.3)

## 2021-02-28 LAB — LIPID PANEL
Cholesterol: 165 mg/dL (ref 0–200)
HDL: 44.8 mg/dL (ref >39.00)
LDL Cholesterol: 102 mg/dL — ABNORMAL HIGH (ref 0–99)
NonHDL: 119.78
Total CHOL/HDL Ratio: 4
Triglycerides: 89 mg/dL (ref 0.0–149.0)
VLDL: 17.8 mg/dL (ref 0.0–40.0)

## 2021-02-28 LAB — VITAMIN D 25 HYDROXY (VIT D DEFICIENCY, FRACTURES): VITD: 33.57 ng/mL (ref 30.00–100.00)

## 2021-02-28 LAB — VITAMIN B12: Vitamin B-12: 285 pg/mL (ref 211–911)

## 2021-02-28 LAB — TSH: TSH: 1.03 u[IU]/mL (ref 0.35–4.50)

## 2021-02-28 LAB — HEMOGLOBIN A1C: Hgb A1c MFr Bld: 7 % — ABNORMAL HIGH (ref 4.6–6.5)

## 2021-02-28 NOTE — Assessment & Plan Note (Signed)
Lab Results  Component Value Date   TSH 0.94 10/09/2020   Check labs today con't synthroid

## 2021-02-28 NOTE — Assessment & Plan Note (Signed)
Well controlled, no changes to meds. Encouraged heart healthy diet such as the DASH diet and exercise as tolerated.  °

## 2021-02-28 NOTE — Assessment & Plan Note (Signed)
Ongoing concern Check labs today --- bp slightly low but pt states it runs higher at home

## 2021-02-28 NOTE — Assessment & Plan Note (Signed)
Encouraged heart healthy diet, increase exercise, avoid trans fats, consider a krill oil cap daily 

## 2021-02-28 NOTE — Assessment & Plan Note (Signed)
Stable con't meds 

## 2021-02-28 NOTE — Assessment & Plan Note (Signed)
Check labs today hgba1c to be checked  minimize simple carbs. Increase exercise as tolerated. Continue current meds  

## 2021-02-28 NOTE — Assessment & Plan Note (Signed)
On aspirin , zetia and statin

## 2021-02-28 NOTE — Assessment & Plan Note (Signed)
Had taken chantix in the past--- no longer available

## 2021-03-02 ENCOUNTER — Other Ambulatory Visit: Payer: Self-pay

## 2021-03-02 MED ORDER — PRAVASTATIN SODIUM 40 MG PO TABS: 40.0000 mg | ORAL_TABLET | Freq: Every day | ORAL | 0 refills | Status: DC

## 2021-03-06 ENCOUNTER — Other Ambulatory Visit (HOSPITAL_BASED_OUTPATIENT_CLINIC_OR_DEPARTMENT_OTHER): Payer: Self-pay

## 2021-03-06 MED ORDER — PFIZER-BIONT COVID-19 VAC-TRIS 30 MCG/0.3ML IM SUSP
INTRAMUSCULAR | 0 refills | Status: DC
  Filled 2021-03-06: qty 0.3, 1d supply, fill #0

## 2021-03-22 ENCOUNTER — Encounter (HOSPITAL_COMMUNITY): Payer: Self-pay | Admitting: Emergency Medicine

## 2021-03-22 ENCOUNTER — Ambulatory Visit (HOSPITAL_COMMUNITY)
Admission: EM | Admit: 2021-03-22 | Discharge: 2021-03-22 | Disposition: A | Discharge status: Home / Self Care | Payer: Medicare Other | Attending: Emergency Medicine | Admitting: Emergency Medicine

## 2021-03-22 ENCOUNTER — Other Ambulatory Visit: Payer: Self-pay

## 2021-03-22 DIAGNOSIS — S80861A Insect bite (nonvenomous), right lower leg, initial encounter: Secondary | ICD-10-CM | POA: Diagnosis not present

## 2021-03-22 DIAGNOSIS — W57XXXA Bitten or stung by nonvenomous insect and other nonvenomous arthropods, initial encounter: Secondary | ICD-10-CM | POA: Diagnosis not present

## 2021-03-22 MED ORDER — TRIAMCINOLONE ACETONIDE 0.1 % EX CREA: 1.0000 "application " | TOPICAL_CREAM | Freq: Two times a day (BID) | CUTANEOUS | 0 refills | Status: DC

## 2021-03-22 NOTE — ED Triage Notes (Signed)
Pt here for an insect bite/rash to LLE onset last night... reports swelling, redness, and itching has subsided  Denies f/v/n/d  A&O x4... NAD.Marland Kitchen. ambulatory

## 2021-03-22 NOTE — Discharge Instructions (Addendum)
Take/use all medications as prescribed. Increase fluid intake. You may take Benadryl 25-50mg  every 4-6 hours as needed OR Zyrtec 10mg  daily for itching/inflammation. You may also take over the counter Famotidine 20mg  once daily to help with itching/inflammation. You may apply ice wrapped in a towel to affected area 3-5 times daily for 10-15 minute intervals. See your PCP if area does not improve in 5-6 days. See your PCP or return to clinic sooner if area worsens or you develop fever.

## 2021-03-22 NOTE — ED Provider Notes (Signed)
  Subjective:    Debra [name] is a 65 y.o. who presents for evaluation of an unknown insect bite/sting involving the right lower leg. The bite/sting occurred yesterday night. The lesion is pink, and raised in texture. It has changed over time and causes no discomfort.  She has used the following treatments so far: none  Review of Systems Associated symptoms: None.  Denies: breathing difficulty, throat swelling, difficulty swallowing, wheezing, chest tightness and light-headedness  Patient does not report previous anaphylaxis.  The following portions of the patient's history were reviewed and updated as appropriate: allergies, current medications, past family history, past medical history, past social history, past surgical history and problem list.   Objective:   Today's Vitals   03/22/21 1724 03/22/21 1727  BP: 111/66   Pulse: 64   Resp: 18   Temp: 98.2 F (36.8 C)   TempSrc: Oral   SpO2: 96%   PainSc:  0-No pain    General:  alert, cooperative and appears stated age  Skin:  erythema noted on right lower extremity and area is mildly raised without purulence. + small area of induration.      Assessment:   Sting/bite from unknown insect without signs of infection, without significant allergic reaction. Currently does notshow any signs of anaphylaxis.  Advised of home treatment and care as outlined in her AVS.  Rx triamcinolone cream to the patient's preferred pharmacy.  Return as needed.  Patient verbalized understanding and agreeable to plan.  Patient stable upon discharge.   Plan:   1. Insect bite of right lower leg, initial encounter    Discharge Instructions      Take/use all medications as prescribed.  Increase fluid intake.  You may take Benadryl 25-50mg  every 4-6 hours as needed OR Zyrtec 10mg  daily for itching/inflammation.  You may also take over the counter Famotidine 20mg  once daily to help with itching/inflammation.  You may apply ice wrapped in a  towel to affected area 3-5 times daily for 10-15 minute intervals.  See your PCP if area does not improve in 5-6 days.  See your PCP or return to clinic sooner if area worsens or you develop fever.       Serafina Royals, FNP-C 03/22/21     Serafina Royals, [place] 03/22/21 1805

## 2021-04-14 ENCOUNTER — Encounter: Payer: Self-pay | Admitting: Family Medicine

## 2021-04-20 ENCOUNTER — Ambulatory Visit (INDEPENDENT_AMBULATORY_CARE_PROVIDER_SITE_OTHER): Payer: Medicare Other | Admitting: Family Medicine

## 2021-04-20 ENCOUNTER — Other Ambulatory Visit: Payer: Self-pay

## 2021-04-20 ENCOUNTER — Other Ambulatory Visit (HOSPITAL_COMMUNITY)
Admission: RE | Admit: 2021-04-20 | Discharge: 2021-04-20 | Disposition: A | Discharge status: Home / Self Care | Payer: Medicare Other | Source: Ambulatory Visit | Attending: Family Medicine | Admitting: Family Medicine

## 2021-04-20 VITALS — BP 158/90 | HR 66 | Temp 98.1°F | Ht 63.0 in | Wt 179.8 lb

## 2021-04-20 DIAGNOSIS — Z1231 Encounter for screening mammogram for malignant neoplasm of breast: Secondary | ICD-10-CM

## 2021-04-20 DIAGNOSIS — E1169 Type 2 diabetes mellitus with other specified complication: Secondary | ICD-10-CM

## 2021-04-20 DIAGNOSIS — N3941 Urge incontinence: Secondary | ICD-10-CM | POA: Diagnosis not present

## 2021-04-20 DIAGNOSIS — I10 Essential (primary) hypertension: Secondary | ICD-10-CM | POA: Diagnosis not present

## 2021-04-20 DIAGNOSIS — E785 Hyperlipidemia, unspecified: Secondary | ICD-10-CM

## 2021-04-20 DIAGNOSIS — E1165 Type 2 diabetes mellitus with hyperglycemia: Secondary | ICD-10-CM

## 2021-04-20 DIAGNOSIS — E2839 Other primary ovarian failure: Secondary | ICD-10-CM

## 2021-04-20 LAB — POC URINALSYSI DIPSTICK (AUTOMATED)
Blood, UA: NEGATIVE
Glucose, UA: NEGATIVE
Ketones, UA: NEGATIVE
Nitrite, UA: NEGATIVE
Protein, UA: POSITIVE — AB
Spec Grav, UA: 1.015 (ref 1.010–1.025)
Urobilinogen, UA: NEGATIVE U/dL — AB
pH, UA: 6 (ref 5.0–8.0)

## 2021-04-20 MED ORDER — METFORMIN HCL 500 MG PO TABS: 500.0000 mg | ORAL_TABLET | Freq: Two times a day (BID) | ORAL | 1 refills | Status: DC

## 2021-04-20 MED ORDER — METOPROLOL SUCCINATE ER 100 MG PO TB24: 100.0000 mg | ORAL_TABLET | Freq: Every day | ORAL | 3 refills | Status: DC

## 2021-04-20 MED ORDER — SOLIFENACIN SUCCINATE 5 MG PO TABS: 5.0000 mg | ORAL_TABLET | Freq: Every day | ORAL | 2 refills | Status: DC

## 2021-04-20 NOTE — Progress Notes (Signed)
Patient ID: Debra [name], female    DOB: [birth year]  Age: 65 y.o. MRN: [id number]    Subjective:  Subjective  HPI Debra [name] presents for an office visit today. Debra [name] complains of elevated BP x 6 days. Debra [name] notes that Debra [name] has been stress due to watching news on the January 6 insurrection hearing. Debra [name] endorses taking 25 mg of Hydrochlorothiazide PO Daily, 100 mg of losartan PO Daily, and 50 mg of TOPROL-XL PO BID for her dx of HTN. Debra [name] notes that Debra [name] only take one dose of TOPROL-XL, since Debra [name] was tired. Debra [name] reports that Debra [name] had taken TOPROL-XL, however her BP is around 160.  BP Readings from Last 3 Encounters:  04/20/21 (!) 158/90  03/22/21 111/66  02/27/21 100/68   Debra [name] also complains of elevated glucose level. Debra [name] notes that Debra [name] has been consuming sugar. Pt reports that Debra [name] has an addiction to ice cream. Debra [name] states that Debra [name] wishes [facility], however her insurance doesn't cover it. Debra [name] notes that Debra [name] has been following recommend routine, during [facility], however Debra [name] hasn't been losing weight.  Wt Readings from Last 3 Encounters:  04/20/21 179 lb 12.8 oz (81.6 kg)  02/27/21 171 lb 6.4 oz (77.7 kg)  01/23/21 177 lb 3.2 oz (80.4 kg)   Lab Results  Component Value Date   HGBA1C 6.5 (H) 04/20/2021  Debra [name] also complains of stinging sensation on L flank. Debra [name] notes that the area itches. Debra [name] reports that Debra [name] has not suffer from any bug bites.  Debra [name] also complains sleep disturbance secondary to incontinent. Debra [name] notes that Debra [name] suffers from incontinent and requires padding while sleeping.  Debra [name] also complains of increase in frequency. Debra [name] reports that Debra [name] experiences frequency all day. Debra [name] notes that when Debra [name] thinks of urinating, Debra [name] will need to go to the bath room.  Debra [name] also complains of vaginal discharge. Debra [name] states that Debra [name] cleans her vaginal area well, but Debra [name] still notices discharge in her urine. Pt is unawares if the Sxs is result of her age.  Debra [name] also  complains of chest pain. Pt is planning on receiving a mammogram soon.  Debra [name] also complains of numbness in the bilateral LE.   Debra [name] denies any SOB, fever, abdominal pain, cough, chills, sore throat, dysuria, back pain, HA, or N/V/D at this time.  Review of Systems  Constitutional:  Negative for chills, fatigue and fever.       (+) elevated glucose level    HENT:  Negative for ear pain, rhinorrhea, sinus pressure, sinus pain, sore throat and tinnitus.   Eyes:  Negative for pain.  Respiratory:  Negative for cough, shortness of breath and wheezing.   Cardiovascular:  Positive for chest pain.       (+) elevated BP    Gastrointestinal:  Negative for abdominal pain, anal bleeding, constipation, diarrhea, nausea and vomiting.  Genitourinary:  Positive for frequency and vaginal discharge. Negative for flank pain.       (+) discolored urine   Musculoskeletal:  Negative for back pain and neck pain.       (+) stinging sensation L side of flank.    Skin:  Negative for rash.  Neurological:  Positive for numbness (Bilateral LE). Negative for seizures, weakness, light-headedness and headaches.  Psychiatric/Behavioral:  Positive for sleep disturbance.    History Past Medical History:  Diagnosis Date   Abdominal pain, right upper quadrant 04/30/2007   Allergies 02/03/2019   Anemia, iron deficiency 04/17/2017  Aortic atherosclerosis 10/10/2017   Atypical chest pain 08/31/2014   Back pain with radiculopathy 01/25/2010   Check xray --- ? Cause of leg weakness   Chronic pain of both shoulders 10/31/2020   Constipation    DDD (degenerative disc disease)    Diabetes mellitus type II, uncontrolled 53/29/9242   Diastolic dysfunction 68/34/1962   Epilepsy    Childhood seziures; pt reports that Debra [name] grew out of them and they have not occurred since childhood   Essential hypertension 08/31/2010   Poorly controlled will alter medications, encouraged DASH diet, minimize caffeine and obtain adequate sleep.  Report concerning symptoms and follow up as directed and as needed [employer] metoprolol Edema may be c   Fatigue 01/25/2010   Generalized anxiety disorder 04/22/2018   Stable co'nt meds   GERD (gastroesophageal reflux disease) 07/16/2007   Hearing loss 07/24/2012   Refer to hearing clinic   Hiatal hernia 07/16/2007   Hyperglycemia, fasting 05/04/2010   Hyperlipidemia 04/17/2017   Encouraged heart healthy diet, increase exercise, avoid trans fats, consider a krill oil cap daily   Hypothyroidism 11/02/2010   con't meds; Lab Results Component Value TSH 1.44 08/12/2017   Insomnia 04/30/2007   Knee pain 07/16/2007   Leg pain, bilateral 11/17/2008   Low back pain 07/16/2007   Major depressive disorder 01/30/2018   con't meds F/u counselor   Mixed connective tissue disease    with Raynaud's   Obstructive sleep apnea 01/30/2018   F/u with pulm; May be cause of some of her memory/concentration problems   Osteoarthritis    Palpitations 02/12/2008   Pansinusitis 01/30/2018   Peripheral vertigo 04/30/2007   Resolved rto prn   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   Her family history includes Anxiety disorder in her mother; Arthritis in her mother; Breast cancer in her mother; Cancer in her mother; Colon cancer in her maternal grandfather; Depression in her mother; Diabetes in her sister; Heart disease in her father; Heart disease (age of onset: 52) in her mother; Hyperlipidemia in her mother and sister; Hypertension in her father and sister; Memory loss in her maternal aunt; Stroke in her father; Thyroid disease in her mother.Debra [name] reports that Debra [name] has been  smoking cigarettes. Debra [name] has a 33.00 pack-year smoking history. Debra [name] has never used smokeless tobacco. Debra [name] reports current alcohol use. Debra [name] reports that Debra [name] does not use drugs.  Current Outpatient Medications on File Prior to Visit  Medication Sig Dispense Refill   aspirin EC 81 MG tablet Take 1 tablet (81 mg total) by mouth daily. Swallow whole. 90 tablet 3   azelastine (ASTELIN) 0.1 % nasal spray Place 2 sprays into both nostrils 2 (two) times daily. Use in each nostril as directed 30 mL 5   blood glucose meter kit and supplies KIT Dispense based on patient and insurance preference. Use up to four times daily as directed. (FOR ICD-9 250.00, 250.01). 1 each 0   COVID-19 mRNA Vac-TriS, Pfizer, (PFIZER-BIONT COVID-19 VAC-TRIS) SUSP injection Inject into the muscle. 0.3 mL 0   ezetimibe (ZETIA) 10 MG tablet Take 1 tablet (10 mg total) by mouth at bedtime. 30 tablet 2   gabapentin (NEURONTIN)  600 MG tablet Take 1 tablet (600 mg total) by mouth 3 (three) times daily. 270 tablet 1   hydrochlorothiazide (HYDRODIURIL) 25 MG tablet 1 po qd 90 tablet 3   levothyroxine (SYNTHROID) 50 MCG tablet Take 1 tablet (50 mcg total) by mouth daily. 90 tablet 3   losartan (COZAAR) 100 MG tablet Take 1 tablet (100 mg total) by mouth daily. 90 tablet 3   methocarbamol (ROBAXIN) 500 MG tablet Take 1 tablet (500 mg total) by mouth in the morning and at bedtime. 60 tablet 3   montelukast (SINGULAIR) 10 MG tablet Take 1 tablet (10 mg total) by mouth at bedtime. 30 tablet 11   NON FORMULARY [facility]  Scar Cream -  Verapamil 10%, Pentoxifylline 5% Apply 1-2 grams to affected area 3-4 times daily Qty. 120 gm 3 refills     pantoprazole (PROTONIX) 40 MG tablet Take 1 tablet (40 mg total) by mouth daily. 30 tablet 3   PARoxetine (PAXIL) 30 MG tablet Take 1 tablet (30 mg total) by mouth daily. 90 tablet 1   pravastatin (PRAVACHOL) 40 MG tablet Take 1 tablet (40 mg total) by mouth daily. 90 tablet 0   Vitamin D,  Ergocalciferol, (DRISDOL) 1.25 MG (50000 UT) CAPS capsule Take 1 capsule (50,000 Units total) by mouth every 7 (seven) days. 4 capsule 0   No current facility-administered medications on file prior to visit.     Objective:  Objective  Physical Exam Vitals and nursing note reviewed.  Constitutional:      General: Debra [name] is not in acute distress.    Appearance: Normal appearance. Debra [name] is well-developed. Debra [name] is not ill-appearing.  HENT:     Head: Normocephalic and atraumatic.     Right Ear: External ear normal.     Left Ear: External ear normal.     Nose: Nose normal.  Eyes:     General:        Right eye: No discharge.        Left eye: No discharge.     Extraocular Movements: Extraocular movements intact.     Pupils: Pupils are equal, round, and reactive to light.  Cardiovascular:     Rate and Rhythm: Normal rate and regular rhythm.     Pulses: Normal pulses.     Heart sounds: Normal heart sounds. No murmur heard.   No friction rub. No gallop.  Pulmonary:     Effort: Pulmonary effort is normal. No respiratory distress.     Breath sounds: Normal breath sounds. No stridor. No wheezing, rhonchi or rales.  Chest:     Chest wall: No tenderness.  Abdominal:     General: Bowel sounds are normal. There is no distension.     Palpations: Abdomen is soft. There is no mass.     Tenderness: There is no abdominal tenderness. There is no guarding or rebound.     Hernia: No hernia is present.  Musculoskeletal:        General: Normal range of motion.     Cervical back: Normal range of motion and neck supple.     Right lower leg: No edema.     Left lower leg: No edema.  Skin:    General: Skin is warm and dry.  Neurological:     Mental Status: Debra [name] is alert and oriented to person, place, and time.  Psychiatric:        Behavior: Behavior normal.        Thought Content: Thought content normal.     BP (!) 158/90 (BP Location: Left Arm, Patient Position: Sitting, Cuff Size: Large)   Pulse 66    Temp 98.1 F (36.7 C) (Oral)   Ht 5' 3" (1.6 m)   Wt 179 lb 12.8 oz (81.6 kg)   SpO2 97%   BMI 31.85 kg/m  Wt Readings from Last 3 Encounters:  04/20/21 179 lb 12.8 oz (81.6 kg)  02/27/21 171 lb 6.4 oz (77.7 kg)  01/23/21 177 lb 3.2 oz (80.4 kg)     Lab Results  Component Value Date   WBC 6.4 11/24/2020   HGB 11.1 (L) 11/24/2020   HCT 33.5 (L) 11/24/2020   PLT 239 11/24/2020   GLUCOSE 91 04/20/2021   CHOL 168 04/20/2021   TRIG 91 04/20/2021   HDL 47 (L) 04/20/2021   LDLDIRECT 126.3 06/06/2011   LDLCALC 102 (H) 04/20/2021   ALT 14 04/20/2021   AST 27 04/20/2021   NA 140 04/20/2021   K 4.3 04/20/2021   CL 108 04/20/2021   CREATININE 0.71 04/20/2021   BUN 13 04/20/2021   CO2 24 04/20/2021   TSH 1.03 02/27/2021   HGBA1C 6.5 (H) 04/20/2021   MICROALBUR 1.1 11/24/2020    No results found.   Assessment & Plan:  Plan   Meds ordered this encounter  Medications   metoprolol succinate (TOPROL-XL) 100 MG 24 hr tablet    Sig: Take 1 tablet (100 mg total) by mouth daily. Take with or immediately following a meal.    Dispense:  90 tablet    Refill:  3   metFORMIN (GLUCOPHAGE) 500 MG tablet    Sig: Take 1 tablet (500 mg total) by mouth 2 (two) times daily with a meal.    Dispense:  180 tablet    Refill:  1   solifenacin (VESICARE) 5 MG tablet    Sig: Take 1 tablet (5 mg total) by mouth daily.    Dispense:  30 tablet    Refill:  2    Problem List Items Addressed This Visit       Unprioritized   Morbid obesity   Relevant Medications   metFORMIN (GLUCOPHAGE) 500 MG tablet   Other Relevant Orders   Amb Ref to Medical Weight Management   Diabetes mellitus type II, uncontrolled    hgba1c to be checked, minimize simple carbs. Increase exercise as tolerated. Continue current meds        Relevant Medications   metFORMIN (GLUCOPHAGE) 500 MG tablet   Essential hypertension    Poorly controlled will alter medications, encouraged DASH diet, minimize caffeine and  obtain adequate sleep. Report concerning symptoms and follow up as directed and as needed       Relevant Medications   metoprolol succinate (TOPROL-XL) 100 MG 24 hr tablet   Urge incontinence of urine    vesicare daily Consider urology if no improvement        Relevant Medications   solifenacin (VESICARE) 5 MG tablet   Other Relevant Orders   POCT Urinalysis Dipstick (Automated) (Completed)   Cervicovaginal ancillary only( [place])   Urine Culture (Completed)   Other Visit Diagnoses     Primary hypertension    -  Primary   Relevant Medications   metoprolol succinate (TOPROL-XL) 100 MG 24 hr tablet   Other Relevant Orders   Lipid panel (Completed)   Hemoglobin A1c (Completed)   Comprehensive metabolic panel (Completed)   Type 2 diabetes mellitus with hyperglycemia, unspecified whether long term insulin use ([place])  Relevant Medications   metFORMIN (GLUCOPHAGE) 500 MG tablet   Other Relevant Orders   Lipid panel (Completed)   Hemoglobin A1c (Completed)   Comprehensive metabolic panel (Completed)   Hyperlipidemia associated with type 2 diabetes mellitus ([facility])       Relevant Medications   metFORMIN (GLUCOPHAGE) 500 MG tablet   Other Relevant Orders   Lipid panel (Completed)   Hemoglobin A1c (Completed)   Comprehensive metabolic panel (Completed)   Estrogen deficiency       Relevant Orders   DG Bone Density   MM DIGITAL SCREENING BILATERAL   Encounter for screening mammogram for malignant neoplasm of breast       Relevant Orders   DG Bone Density   MM DIGITAL SCREENING BILATERAL      Diabetic Foot Exam - Simple   Simple Foot Form Diabetic Foot exam was performed with the following findings: Yes 04/20/2021  2:16 PM  Visual Inspection No deformities, no ulcerations, no other skin breakdown bilaterally: Yes Sensation Testing Intact to touch and monofilament testing bilaterally: Yes Pulse Check Posterior Tibialis and Dorsalis pulse intact bilaterally:  Yes Comments    Follow-up: Return in about 6 months (around 10/20/2021), or if symptoms worsen or fail to improve, for annual exam, fasting.   I,Gordon Zheng,acting as a scribe for  R Lowne Chase, DO.,have documented all relevant documentation on the behalf of  R Lowne Chase, DO,as directed by   R Lowne Chase, DO while in the presence of  R Lowne Chase, DO.  I,  R Lowne Chase, DO, have reviewed all documentation for this visit. The documentation on 04/22/21 for the exam, diagnosis, procedures, and orders are all accurate and complete.    

## 2021-04-20 NOTE — Patient Instructions (Signed)
https://www.nhlbi.nih.gov/files/docs/public/heart/dash_brief.pdf">  DASH Eating Plan DASH stands for Dietary Approaches to Stop Hypertension. The DASH eating plan is a healthy eating plan that has been shown to: Reduce high blood pressure (hypertension). Reduce your risk for type 2 diabetes, heart disease, and stroke. Help with weight loss. What are tips for following this plan? Reading food labels Check food labels for the amount of salt (sodium) per serving. Choose foods with less than 5 percent of the Daily Value of sodium. Generally, foods with less than 300 milligrams (mg) of sodium per serving fit into this eating plan. To find whole grains, look for the word "whole" as the first word in the ingredient list. Shopping Buy products labeled as "low-sodium" or "no salt added." Buy fresh foods. Avoid canned foods and pre-made or frozen meals. Cooking Avoid adding salt when cooking. Use salt-free seasonings or herbs instead of table salt or sea salt. Check with your health care provider or pharmacist before using salt substitutes. Do not fry foods. Cook foods using healthy methods such as baking, boiling, grilling, roasting, and broiling instead. Cook with heart-healthy oils, such as olive, canola, avocado, soybean, or sunflower oil. Meal planning  Eat a balanced diet that includes: 4 or more servings of fruits and 4 or more servings of vegetables each day. Try to fill one-half of your plate with fruits and vegetables. 6-8 servings of whole grains each day. Less than 6 oz (170 g) of lean meat, poultry, or fish each day. A 3-oz (85-g) serving of meat is about the same size as a deck of cards. One egg equals 1 oz (28 g). 2-3 servings of low-fat dairy each day. One serving is 1 cup (237 mL). 1 serving of nuts, seeds, or beans 5 times each week. 2-3 servings of heart-healthy fats. Healthy fats called omega-3 fatty acids are found in foods such as walnuts, flaxseeds, fortified milks, and eggs.  These fats are also found in cold-water fish, such as sardines, salmon, and mackerel. Limit how much you eat of: Canned or prepackaged foods. Food that is high in trans fat, such as some fried foods. Food that is high in saturated fat, such as fatty meat. Desserts and other sweets, sugary drinks, and other foods with added sugar. Full-fat dairy products. Do not salt foods before eating. Do not eat more than 4 egg yolks a week. Try to eat at least 2 vegetarian meals a week. Eat more home-cooked food and less restaurant, buffet, and fast food.  Lifestyle When eating at a restaurant, ask that your food be prepared with less salt or no salt, if possible. If you drink alcohol: Limit how much you use to: 0-1 drink a day for women who are not pregnant. 0-2 drinks a day for men. Be aware of how much alcohol is in your drink. In the [place] one drink equals one 12 oz bottle of beer (355 mL), one 5 oz glass of wine (148 mL), or one 1 oz glass of hard liquor (44 mL). General information Avoid eating more than 2,300 mg of salt a day. If you have hypertension, you may need to reduce your sodium intake to 1,500 mg a day. Work with your health care provider to maintain a healthy body weight or to lose weight. Ask what an ideal weight is for you. Get at least 30 minutes of exercise that causes your heart to beat faster (aerobic exercise) most days of the week. Activities may include walking, swimming, or biking. Work with your health care provider   or dietitian to adjust your eating plan to your individual calorie needs. What foods should I eat? Fruits All fresh, dried, or frozen fruit. Canned fruit in natural juice (without addedsugar). Vegetables Fresh or frozen vegetables (raw, steamed, roasted, or grilled). Low-sodium or reduced-sodium tomato and vegetable juice. Low-sodium or reduced-sodium tomatosauce and tomato paste. Low-sodium or reduced-sodium canned vegetables. Grains Whole-grain or  whole-wheat bread. Whole-grain or whole-wheat pasta. Brown rice. Oatmeal. Quinoa. Bulgur. Whole-grain and low-sodium cereals. Pita bread.Low-fat, low-sodium crackers. Whole-wheat flour tortillas. Meats and other proteins Skinless chicken or turkey. Ground chicken or turkey. Pork with fat trimmed off. Fish and seafood. Egg whites. Dried beans, peas, or lentils. Unsalted nuts, nut butters, and seeds. Unsalted canned beans. Lean cuts of beef with fat trimmed off. Low-sodium, lean precooked or cured meat, such as sausages or meatloaves. Dairy Low-fat (1%) or fat-free (skim) milk. Reduced-fat, low-fat, or fat-free cheeses. Nonfat, low-sodium ricotta or cottage cheese. Low-fat or nonfatyogurt. Low-fat, low-sodium cheese. Fats and oils Soft margarine without trans fats. Vegetable oil. Reduced-fat, low-fat, or light mayonnaise and salad dressings (reduced-sodium). Canola, safflower, olive, avocado, soybean, andsunflower oils. Avocado. Seasonings and condiments Herbs. Spices. Seasoning mixes without salt. Other foods Unsalted popcorn and pretzels. Fat-free sweets. The items listed above may not be a complete list of foods and beverages you can eat. Contact a dietitian for more information. What foods should I avoid? Fruits Canned fruit in a light or heavy syrup. Fried fruit. Fruit in cream or buttersauce. Vegetables Creamed or fried vegetables. Vegetables in a cheese sauce. Regular canned vegetables (not low-sodium or reduced-sodium). Regular canned tomato sauce and paste (not low-sodium or reduced-sodium). Regular tomato and vegetable juice(not low-sodium or reduced-sodium). Pickles. Olives. Grains Baked goods made with fat, such as croissants, muffins, or some breads. Drypasta or rice meal packs. Meats and other proteins Fatty cuts of meat. Ribs. Fried meat. Bacon. Bologna, salami, and other precooked or cured meats, such as sausages or meat loaves. Fat from the back of a pig (fatback). Bratwurst.  Salted nuts and seeds. Canned beans with added salt. Canned orsmoked fish. Whole eggs or egg yolks. Chicken or turkey with skin. Dairy Whole or 2% milk, cream, and half-and-half. Whole or full-fat cream cheese. Whole-fat or sweetened yogurt. Full-fat cheese. Nondairy creamers. Whippedtoppings. Processed cheese and cheese spreads. Fats and oils Butter. Stick margarine. Lard. Shortening. Ghee. Bacon fat. Tropical oils, suchas coconut, palm kernel, or palm oil. Seasonings and condiments Onion salt, garlic salt, seasoned salt, table salt, and sea salt. Worcestershire sauce. Tartar sauce. Barbecue sauce. Teriyaki sauce. Soy sauce, including reduced-sodium. Steak sauce. Canned and packaged gravies. Fish sauce. Oyster sauce. Cocktail sauce. Store-bought horseradish. Ketchup. Mustard. Meat flavorings and tenderizers. Bouillon cubes. Hot sauces. Pre-made or packaged marinades. Pre-made or packaged taco seasonings. Relishes. Regular saladdressings. Other foods Salted popcorn and pretzels. The items listed above may not be a complete list of foods and beverages you should avoid. Contact a dietitian for more information. Where to find more information National Heart, Lung, and Blood Institute: www.nhlbi.nih.gov American Heart Association: www.heart.org Academy of Nutrition and Dietetics: www.eatright.org National Kidney Foundation: www.kidney.org Summary The DASH eating plan is a healthy eating plan that has been shown to reduce high blood pressure (hypertension). It may also reduce your risk for type 2 diabetes, heart disease, and stroke. When on the DASH eating plan, aim to eat more fresh fruits and vegetables, whole grains, lean proteins, low-fat dairy, and heart-healthy fats. With the DASH eating plan, you should limit salt (sodium) intake to 2,300   mg a day. If you have hypertension, you may need to reduce your sodium intake to 1,500 mg a day. Work with your health care provider or dietitian to adjust  your eating plan to your individual calorie needs. This information is not intended to replace advice given to you by your health care provider. Make sure you discuss any questions you have with your healthcare provider. Document Revised: 09/17/2019 Document Reviewed: 09/17/2019 Elsevier Patient Education  2022 Elsevier Inc.  

## 2021-04-21 LAB — COMPREHENSIVE METABOLIC PANEL
AG Ratio: 1.2 (calc) (ref 1.0–2.5)
ALT: 14 U/L (ref 6–29)
AST: 27 U/L (ref 10–35)
Albumin: 4.2 g/dL (ref 3.6–5.1)
Alkaline phosphatase (APISO): 86 U/L (ref 37–153)
BUN: 13 mg/dL (ref 7–25)
CO2: 24 mmol/L (ref 20–32)
Calcium: 9.8 mg/dL (ref 8.6–10.4)
Chloride: 108 mmol/L (ref 98–110)
Creat: 0.71 mg/dL (ref 0.50–0.99)
Globulin: 3.6 g/dL (calc) (ref 1.9–3.7)
Glucose, Bld: 91 mg/dL (ref 65–99)
Potassium: 4.3 mmol/L (ref 3.5–5.3)
Sodium: 140 mmol/L (ref 135–146)
Total Bilirubin: 0.5 mg/dL (ref 0.2–1.2)
Total Protein: 7.8 g/dL (ref 6.1–8.1)

## 2021-04-21 LAB — URINE CULTURE
MICRO NUMBER:: 12047775
SPECIMEN QUALITY:: ADEQUATE

## 2021-04-21 LAB — LIPID PANEL
Cholesterol: 168 mg/dL (ref <200)
HDL: 47 mg/dL — ABNORMAL LOW (ref >=50)
LDL Cholesterol (Calc): 102 mg/dL (calc) — ABNORMAL HIGH
Non-HDL Cholesterol (Calc): 121 mg/dL (calc) (ref <130)
Total CHOL/HDL Ratio: 3.6 (calc) (ref <5.0)
Triglycerides: 91 mg/dL (ref <150)

## 2021-04-21 LAB — HEMOGLOBIN A1C
Hgb A1c MFr Bld: 6.5 % of total Hgb — ABNORMAL HIGH (ref <5.7)
Mean Plasma Glucose: 140 mg/dL
eAG (mmol/L): 7.7 mmol/L

## 2021-04-22 ENCOUNTER — Encounter: Payer: Self-pay | Admitting: Family Medicine

## 2021-04-22 NOTE — Assessment & Plan Note (Signed)
vesicare daily Consider urology if no improvement

## 2021-04-22 NOTE — Assessment & Plan Note (Signed)
hgba1c to be checked, minimize simple carbs. Increase exercise as tolerated. Continue current meds  

## 2021-04-22 NOTE — Assessment & Plan Note (Signed)
Poorly controlled will alter medications, encouraged DASH diet, minimize caffeine and obtain adequate sleep. Report concerning symptoms and follow up as directed and as needed 

## 2021-04-23 ENCOUNTER — Telehealth: Payer: Self-pay | Admitting: Family Medicine

## 2021-04-23 LAB — CERVICOVAGINAL ANCILLARY ONLY
Bacterial Vaginitis (gardnerella): POSITIVE — AB
Candida Glabrata: NEGATIVE
Candida Vaginitis: NEGATIVE
Chlamydia: NEGATIVE
Comment: NEGATIVE
Comment: NEGATIVE
Comment: NEGATIVE
Comment: NEGATIVE
Comment: NEGATIVE
Comment: NORMAL
Neisseria Gonorrhea: NEGATIVE
Trichomonas: NEGATIVE

## 2021-04-23 NOTE — Telephone Encounter (Signed)
Pt requesting to speak with someone regarding her labs states there is one report that concerns her and she wants to speak with Dr. Etter Sjogren regarding it.

## 2021-04-24 NOTE — Telephone Encounter (Signed)
Attempted to call patient. VM was full

## 2021-04-25 ENCOUNTER — Other Ambulatory Visit: Payer: Self-pay

## 2021-04-25 MED ORDER — METRONIDAZOLE 500 MG PO TABS: 500.0000 mg | ORAL_TABLET | Freq: Two times a day (BID) | ORAL | 0 refills | Status: DC

## 2021-05-24 ENCOUNTER — Encounter: Payer: Self-pay | Admitting: Gastroenterology

## 2021-05-24 ENCOUNTER — Other Ambulatory Visit: Payer: Self-pay | Admitting: Family Medicine

## 2021-05-24 DIAGNOSIS — G8929 Other chronic pain: Secondary | ICD-10-CM

## 2021-05-25 ENCOUNTER — Ambulatory Visit (INDEPENDENT_AMBULATORY_CARE_PROVIDER_SITE_OTHER): Payer: Medicare Other | Admitting: Family Medicine

## 2021-05-25 ENCOUNTER — Encounter: Payer: Self-pay | Admitting: Family Medicine

## 2021-05-25 ENCOUNTER — Other Ambulatory Visit: Payer: Self-pay

## 2021-05-25 ENCOUNTER — Other Ambulatory Visit (HOSPITAL_COMMUNITY)
Admission: RE | Admit: 2021-05-25 | Discharge: 2021-05-25 | Disposition: A | Discharge status: Home / Self Care | Payer: Medicare Other | Source: Ambulatory Visit | Attending: Family Medicine | Admitting: Family Medicine

## 2021-05-25 VITALS — BP 144/66 | HR 69 | Temp 98.5°F | Resp 16 | Wt 176.0 lb

## 2021-05-25 DIAGNOSIS — R2689 Other abnormalities of gait and mobility: Secondary | ICD-10-CM

## 2021-05-25 DIAGNOSIS — N898 Other specified noninflammatory disorders of vagina: Secondary | ICD-10-CM | POA: Insufficient documentation

## 2021-05-25 DIAGNOSIS — N760 Acute vaginitis: Secondary | ICD-10-CM

## 2021-05-25 DIAGNOSIS — L299 Pruritus, unspecified: Secondary | ICD-10-CM

## 2021-05-25 DIAGNOSIS — R42 Dizziness and giddiness: Secondary | ICD-10-CM

## 2021-05-25 DIAGNOSIS — B9689 Other specified bacterial agents as the cause of diseases classified elsewhere: Secondary | ICD-10-CM | POA: Diagnosis not present

## 2021-05-25 HISTORY — DX: Other specified noninflammatory disorders of vagina: N89.8

## 2021-05-25 MED ORDER — TRIAMCINOLONE ACETONIDE 0.1 % EX CREA: 1.0000 "application " | TOPICAL_CREAM | Freq: Two times a day (BID) | CUTANEOUS | 0 refills | Status: AC

## 2021-05-25 MED ORDER — PREDNISONE 20 MG PO TABS
40.0000 mg | ORAL_TABLET | Freq: Every day | ORAL | 0 refills | Status: DC
Start: 2021-05-25 — End: 2021-09-27

## 2021-05-25 NOTE — Patient Instructions (Signed)
Pruritus Pruritus is an itchy feeling on the skin. One of the most common causes is dry skin, but many different things can cause itching. Most cases of itching do notrequire medical attention. Sometimes itchy skin can turn into a rash. Follow these instructions at home: Skin care  Apply moisturizing lotion to your skin as needed. Lotion that contains petroleum jelly is best. Take medicines or apply medicated creams only as told by your health care provider. This may include: Corticosteroid cream. Anti-itch lotions. Oral antihistamines. Apply a cool, wet cloth (cool compress) to the affected areas. Take baths with one of the following: Epsom salts. You can get these at your local pharmacy or grocery store. Follow the instructions on the packaging. Baking soda. Pour a small amount into the bath as told by your health care provider. Colloidal oatmeal. You can get this at your local pharmacy or grocery store. Follow the instructions on the packaging. Apply baking soda paste to your skin. To make the paste, stir water into a small amount of baking soda until it reaches a paste-like consistency. Do not scratch your skin. Do not take hot showers or baths, which can make itching worse. A cool shower may help with itching as long as you apply moisturizing lotion after the shower. Do not use scented soaps, detergents, perfumes, and cosmetic products. Instead, use gentle, unscented versions of these items.  General instructions Avoid wearing tight clothes. Keep a journal to help find out what is causing your itching. Write down: What you eat and drink. What cosmetic products you use. What soaps or detergents you use. What you wear, including jewelry. Use a humidifier. This keeps the air moist, which helps to prevent dry skin. Be aware of any changes in your itchiness. Contact a health care provider if: The itching does not go away after several days. You are unusually thirsty or urinating more  than normal. Your skin tingles or feels numb. Your skin or the white parts of your eyes turn yellow (jaundice). You feel weak. You have any of the following: Night sweats. Tiredness (fatigue). Weight loss. Abdominal pain. Summary Pruritus is an itchy feeling on the skin. One of the most common causes is dry skin, but many different conditions and factors can cause itching. Apply moisturizing lotion to your skin as needed. Lotion that contains petroleum jelly is best. Take medicines or apply medicated creams only as told by your health care provider. Do not take hot showers or baths. Do not use scented soaps, detergents, perfumes, or cosmetic products. This information is not intended to replace advice given to you by your health care provider. Make sure you discuss any questions you have with your healthcare provider. Document Revised: 10/28/2017 Document Reviewed: 10/28/2017 Elsevier Patient Education  2022 [employer]  

## 2021-05-25 NOTE — Assessment & Plan Note (Signed)
Refer to pt and neuro  Check labs

## 2021-05-25 NOTE — Assessment & Plan Note (Signed)
?   From bug spray Short burst pred  Check bs several times a day and call office if symptoms worsen

## 2021-05-25 NOTE — Assessment & Plan Note (Signed)
Pt mentioned this at the end of her visit --- with balance issues No falls Refer to neuro for eval Check labs

## 2021-05-25 NOTE — Progress Notes (Signed)
Patient ID: Debra [name], female    DOB: [birth year]  Age: 65 y.o. MRN: [id number]    Subjective:  Subjective  HPI Debra [name] presents for an office visit today. She complains of itching x 2 weeks. Pt has a hx of T2DM. She notes that she doesn't have any rash and her entire body itches.  She states that she was spraying insect repellent inside her house and outdoor x 2 weeks. She notes that she had gotten the insect repellent onto her skin and had shower after. She states that she was wearing shorts, sandals, and short sleeves, while spraying the repellent. Pt is unaware if the sxs were the result of Flagyl, insect repellent, or dry skin. She reports applying lotions onto her skin. She notes that the itching worsen at night time especially on the palms of her bilateral hands and bottoms of the bilateral foot. She states using the bottom of her hanger to scratch the bottom of her foots. She reports the itching doesn't really affect her in the afternoon. She notes waking up at night scratching. She endorses taking benadryl PO and applying lotion, however it provided little to no relief.  She states that she has not been checking her at home blood sugar level. She notes that her blood sugar has been up and down and around 120's.  Lab Results  Component Value Date   HGBA1C 6.5 (H) 04/20/2021  She also complains of vaginal discharge. She notes that the discharge had improve. She endorses taking 500 mg Flagyl BID with meal. She notes showering often, using vagisil, douching, applying underwear spray, and cleaning her inside her vagina and vaginal area with soap frequently.  She also complains of balance difficulty. She describes her balance as being "drunk". She notes that she had fallen, however she is "watching" herself. She states that the balance had worsen over time. She reports going to the gym and balance exercises in attempt to improve her balance difficulty. She notes that the balance  difficulty had  frighten her. She endorses having dizziness while walking. She reports having bilateral leg restless and need to manually stop them from moving. Pt has a fhx of balance difficulty (cousin).   She denies any chest pain, SOB, fever, abdominal pain, cough, chills, sore throat, dysuria, back pain, HA, or N/V/D at this time. She states that her supervisor had committed suicide.    Review of Systems  Constitutional:  Negative for chills, fatigue and fever.  HENT:  Negative for ear pain, rhinorrhea, sinus pressure, sinus pain, sore throat and tinnitus.   Eyes:  Negative for pain.  Respiratory:  Negative for cough, shortness of breath and wheezing.   Cardiovascular:  Negative for chest pain.  Gastrointestinal:  Negative for abdominal pain, anal bleeding, constipation, diarrhea, nausea and vomiting.  Genitourinary:  Positive for vaginal discharge. Negative for flank pain.  Musculoskeletal:  Negative for back pain and neck pain.  Skin:  Negative for rash.       (+) itching    Neurological:  Positive for dizziness. Negative for seizures, weakness, light-headedness, numbness and headaches.       (+) balance difficulty  (+) restless legs   Psychiatric/Behavioral:  Positive for sleep disturbance (secondary to itching).    History Past Medical History:  Diagnosis Date   Abdominal pain, right upper quadrant 04/30/2007   Allergies 02/03/2019   Anemia, iron deficiency 04/17/2017   Aortic atherosclerosis 10/10/2017   Atypical chest pain 08/31/2014   Back pain with  radiculopathy 01/25/2010   Check xray --- ? Cause of leg weakness   Chronic pain of both shoulders 10/31/2020   Constipation    DDD (degenerative disc disease)    Diabetes mellitus type II, uncontrolled 41/63/8453   Diastolic dysfunction 64/68/0321   Epilepsy    Childhood seziures; pt reports that she grew out of them and they have not occurred since childhood   Essential hypertension 08/31/2010   Poorly controlled will  alter medications, encouraged DASH diet, minimize caffeine and obtain adequate sleep. Report concerning symptoms and follow up as directed and as needed [employer] metoprolol Edema may be c   Fatigue 01/25/2010   Generalized anxiety disorder 04/22/2018   Stable co'nt meds   GERD (gastroesophageal reflux disease) 07/16/2007   Hearing loss 07/24/2012   Refer to hearing clinic   Hiatal hernia 07/16/2007   Hyperglycemia, fasting 05/04/2010   Hyperlipidemia 04/17/2017   Encouraged heart healthy diet, increase exercise, avoid trans fats, consider a krill oil cap daily   Hypothyroidism 11/02/2010   con't meds; Lab Results Component Value TSH 1.44 08/12/2017   Insomnia 04/30/2007   Knee pain 07/16/2007   Leg pain, bilateral 11/17/2008   Low back pain 07/16/2007   Major depressive disorder 01/30/2018   con't meds F/u counselor   Mixed connective tissue disease    with Raynaud's   Obstructive sleep apnea 01/30/2018   F/u with pulm; May be cause of some of her memory/concentration problems   Osteoarthritis    Palpitations 02/12/2008   Pansinusitis 01/30/2018   Peripheral vertigo 04/30/2007   Resolved rto prn   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   Her family history includes Anxiety disorder in her mother; Arthritis in her mother; Breast cancer in her mother; Cancer in her mother; Colon cancer in her maternal grandfather; Depression in her mother; Diabetes in her sister; Heart disease in her father; Heart disease (age of onset: 28) in her mother; Hyperlipidemia in her mother and sister; Hypertension in her father and sister; Memory loss in her maternal aunt;  Stroke in her father; Thyroid disease in her mother.She reports that she has been smoking cigarettes. She has a 33.00 pack-year smoking history. She has never used smokeless tobacco. She reports current alcohol use. She reports that she does not use drugs.  Current Outpatient Medications on File Prior to Visit  Medication Sig Dispense Refill   aspirin EC 81 MG tablet Take 1 tablet (81 mg total) by mouth daily. Swallow whole. 90 tablet 3   azelastine (ASTELIN) 0.1 % nasal spray Place 2 sprays into both nostrils 2 (two) times daily. Use in each nostril as directed 30 mL 5   blood glucose meter kit and supplies KIT Dispense based on patient and insurance preference. Use up to four times daily as directed. (FOR ICD-9 250.00, 250.01). 1 each 0   COVID-19 mRNA Vac-TriS, Pfizer, (PFIZER-BIONT COVID-19 VAC-TRIS) SUSP injection Inject into the muscle. 0.3 mL 0   ezetimibe (ZETIA) 10 MG tablet Take 1 tablet (10 mg total) by mouth at bedtime. 30 tablet 2   gabapentin (NEURONTIN) 600 MG tablet Take 1 tablet (600 mg total) by mouth 3 (three) times  daily. 270 tablet 1   hydrochlorothiazide (HYDRODIURIL) 25 MG tablet 1 po qd 90 tablet 3   levothyroxine (SYNTHROID) 50 MCG tablet Take 1 tablet (50 mcg total) by mouth daily. 90 tablet 3   losartan (COZAAR) 100 MG tablet Take 1 tablet (100 mg total) by mouth daily. 90 tablet 3   metFORMIN (GLUCOPHAGE) 500 MG tablet Take 1 tablet (500 mg total) by mouth 2 (two) times daily with a meal. 180 tablet 1   methocarbamol (ROBAXIN) 500 MG tablet Take 1 tablet (500 mg total) by mouth in the morning and at bedtime. 60 tablet 3   metoprolol succinate (TOPROL-XL) 100 MG 24 hr tablet Take 1 tablet (100 mg total) by mouth daily. Take with or immediately following a meal. 90 tablet 3   metroNIDAZOLE (FLAGYL) 500 MG tablet Take 1 tablet (500 mg total) by mouth 2 (two) times daily with a meal. DO NOT CONSUME ALCOHOL WHILE TAKING THIS MEDICATION. 14 tablet 0   montelukast (SINGULAIR) 10  MG tablet Take 1 tablet (10 mg total) by mouth at bedtime. 30 tablet 11   NON [place]  Scar Cream -  Verapamil 10%, Pentoxifylline 5% Apply 1-2 grams to affected area 3-4 times daily Qty. 120 gm 3 refills     pantoprazole (PROTONIX) 40 MG tablet Take 1 tablet (40 mg total) by mouth daily. 30 tablet 3   PARoxetine (PAXIL) 30 MG tablet Take 1 tablet (30 mg total) by mouth daily. 90 tablet 1   pravastatin (PRAVACHOL) 40 MG tablet Take 1 tablet (40 mg total) by mouth daily. 90 tablet 0   solifenacin (VESICARE) 5 MG tablet Take 1 tablet (5 mg total) by mouth daily. 30 tablet 2   Vitamin D, Ergocalciferol, (DRISDOL) 1.25 MG (50000 UT) CAPS capsule Take 1 capsule (50,000 Units total) by mouth every 7 (seven) days. 4 capsule 0   No current facility-administered medications on file prior to visit.     Objective:  Objective  Physical Exam Vitals and nursing note reviewed.  Constitutional:      General: She is not in acute distress.    Appearance: Normal appearance. She is well-developed. She is not ill-appearing.  HENT:     Head: Normocephalic and atraumatic.     Right Ear: External ear normal.     Left Ear: External ear normal.     Nose: Nose normal.  Eyes:     General:        Right eye: No discharge.        Left eye: No discharge.     Extraocular Movements: Extraocular movements intact.     Pupils: Pupils are equal, round, and reactive to light.  Cardiovascular:     Rate and Rhythm: Normal rate and regular rhythm.     Pulses: Normal pulses.     Heart sounds: Normal heart sounds. No murmur heard.   No friction rub. No gallop.  Pulmonary:     Effort: Pulmonary effort is normal. No respiratory distress.     Breath sounds: Normal breath sounds. No stridor. No wheezing, rhonchi or rales.  Chest:     Chest wall: No tenderness.  Abdominal:     General: Bowel sounds are normal. There is no distension.     Palpations: Abdomen is soft. There is no mass.      Tenderness: There is no abdominal tenderness. There is no guarding or rebound.     Hernia: No hernia is present.  Musculoskeletal:  General: Normal range of motion.     Cervical back: Normal range of motion and neck supple.     Right lower leg: No edema.     Left lower leg: No edema.  Skin:    General: Skin is warm and dry.     Comments: There is evidence of scratching on her bilateral forearms.   Neurological:     Mental Status: She is alert and oriented to person, place, and time.  Psychiatric:        Behavior: Behavior normal.        Thought Content: Thought content normal.   BP (!) 144/66 (BP Location: Left Arm, Patient Position: Sitting, Cuff Size: Small)   Pulse 69   Temp 98.5 F (36.9 C) (Oral)   Resp 16   Wt 176 lb (79.8 kg)   SpO2 100%   BMI 31.18 kg/m  Wt Readings from Last 3 Encounters:  05/25/21 176 lb (79.8 kg)  04/20/21 179 lb 12.8 oz (81.6 kg)  02/27/21 171 lb 6.4 oz (77.7 kg)     Lab Results  Component Value Date   WBC 6.4 11/24/2020   HGB 11.1 (L) 11/24/2020   HCT 33.5 (L) 11/24/2020   PLT 239 11/24/2020   GLUCOSE 91 04/20/2021   CHOL 168 04/20/2021   TRIG 91 04/20/2021   HDL 47 (L) 04/20/2021   LDLDIRECT 126.3 06/06/2011   LDLCALC 102 (H) 04/20/2021   ALT 14 04/20/2021   AST 27 04/20/2021   NA 140 04/20/2021   K 4.3 04/20/2021   CL 108 04/20/2021   CREATININE 0.71 04/20/2021   BUN 13 04/20/2021   CO2 24 04/20/2021   TSH 1.03 02/27/2021   HGBA1C 6.5 (H) 04/20/2021   MICROALBUR 1.1 11/24/2020    No results found.   Assessment & Plan:  Plan    Meds ordered this encounter  Medications   predniSONE (DELTASONE) 20 MG tablet    Sig: Take 2 tablets (40 mg total) by mouth daily.    Dispense:  10 tablet    Refill:  0   triamcinolone cream (KENALOG) 0.1 %    Sig: Apply 1 application topically 2 (two) times daily for 7 days.    Dispense:  15 g    Refill:  0    Problem List Items Addressed This Visit       Unprioritized    Balance disorder    Refer to pt and neuro  Check labs        Relevant Orders   Ambulatory referral to Physical Therapy   Ambulatory referral to Neurology   TSH   Vitamin B12   Dizziness    Pt mentioned this at the end of her visit --- with balance issues No falls Refer to neuro for eval Check labs        Relevant Orders   Ambulatory referral to Neurology   TSH   Vitamin B12   Pruritic dermatitis - Primary    ? From bug spray Short burst pred  Check bs several times a day and call office if symptoms worsen        Relevant Medications   predniSONE (DELTASONE) 20 MG tablet   Other Relevant Orders   Protime-INR   APTT   CBC with Differential/Platelet   Comprehensive metabolic panel   Vaginal discharge    Pt finished flagyl Recheck swab       Relevant Orders   Cervicovaginal ancillary only( [place])    Follow-up: Return if  symptoms worsen or fail to improve.   I,Gordon Zheng,acting as a Education administrator for [employer], DO.,have documented all relevant documentation on the behalf of Ann Held, DO,as directed by  Ann Held, DO while in the presence of Pierz, DO, have reviewed all documentation for this visit. The documentation on 05/25/21 for the exam, diagnosis, procedures, and orders are all accurate and complete.

## 2021-05-25 NOTE — Assessment & Plan Note (Signed)
Pt finished flagyl Recheck swab

## 2021-05-26 LAB — CBC WITH DIFFERENTIAL/PLATELET
Absolute Monocytes: 568 cells/uL (ref 200–950)
Basophils Absolute: 17 cells/uL (ref 0–200)
Basophils Relative: 0.3 %
Eosinophils Absolute: 128 cells/uL (ref 15–500)
Eosinophils Relative: 2.2 %
HCT: 35.4 % (ref 35.0–45.0)
Hemoglobin: 11.6 g/dL — ABNORMAL LOW (ref 11.7–15.5)
Lymphs Abs: 2772 cells/uL (ref 850–3900)
MCH: 27.7 pg (ref 27.0–33.0)
MCHC: 32.8 g/dL (ref 32.0–36.0)
MCV: 84.5 fL (ref 80.0–100.0)
MPV: 10.6 fL (ref 7.5–12.5)
Monocytes Relative: 9.8 %
Neutro Abs: 2314 cells/uL (ref 1500–7800)
Neutrophils Relative %: 39.9 %
Platelets: 260 10*3/uL (ref 140–400)
RBC: 4.19 10*6/uL (ref 3.80–5.10)
RDW: 13.1 % (ref 11.0–15.0)
Total Lymphocyte: 47.8 %
WBC: 5.8 10*3/uL (ref 3.8–10.8)

## 2021-05-26 LAB — COMPREHENSIVE METABOLIC PANEL
AG Ratio: 1.1 (calc) (ref 1.0–2.5)
ALT: 14 U/L (ref 6–29)
AST: 21 U/L (ref 10–35)
Albumin: 4.1 g/dL (ref 3.6–5.1)
Alkaline phosphatase (APISO): 89 U/L (ref 37–153)
BUN: 16 mg/dL (ref 7–25)
CO2: 26 mmol/L (ref 20–32)
Calcium: 9.9 mg/dL (ref 8.6–10.4)
Chloride: 104 mmol/L (ref 98–110)
Creat: 0.8 mg/dL (ref 0.50–1.05)
Globulin: 3.7 g/dL (calc) (ref 1.9–3.7)
Glucose, Bld: 90 mg/dL (ref 65–99)
Potassium: 3.9 mmol/L (ref 3.5–5.3)
Sodium: 139 mmol/L (ref 135–146)
Total Bilirubin: 0.5 mg/dL (ref 0.2–1.2)
Total Protein: 7.8 g/dL (ref 6.1–8.1)

## 2021-05-26 LAB — APTT: aPTT: 29 s (ref 23–32)

## 2021-05-26 LAB — PROTIME-INR
INR: 1
Prothrombin Time: 10.1 s (ref 9.0–11.5)

## 2021-05-26 LAB — VITAMIN B12: Vitamin B-12: 563 pg/mL (ref 200–1100)

## 2021-05-26 LAB — TSH: TSH: 1.26 mIU/L (ref 0.40–4.50)

## 2021-05-28 ENCOUNTER — Other Ambulatory Visit: Payer: Self-pay | Admitting: Family Medicine

## 2021-05-28 DIAGNOSIS — R42 Dizziness and giddiness: Secondary | ICD-10-CM

## 2021-05-28 DIAGNOSIS — R2689 Other abnormalities of gait and mobility: Secondary | ICD-10-CM

## 2021-05-28 LAB — CERVICOVAGINAL ANCILLARY ONLY
Bacterial Vaginitis (gardnerella): POSITIVE — AB
Candida Glabrata: NEGATIVE
Candida Vaginitis: NEGATIVE
Chlamydia: NEGATIVE
Comment: NEGATIVE
Comment: NEGATIVE
Comment: NEGATIVE
Comment: NEGATIVE
Comment: NEGATIVE
Comment: NORMAL
Neisseria Gonorrhea: NEGATIVE
Trichomonas: NEGATIVE

## 2021-05-31 MED ORDER — METRONIDAZOLE 500 MG PO TABS: 500.0000 mg | ORAL_TABLET | Freq: Two times a day (BID) | ORAL | 0 refills | Status: AC

## 2021-05-31 NOTE — Addendum Note (Signed)
Addended by: Wynonia Musty A on: 05/31/2021 04:08 PM   Modules accepted: Orders

## 2021-06-15 ENCOUNTER — Other Ambulatory Visit: Payer: Self-pay | Admitting: Family Medicine

## 2021-06-15 DIAGNOSIS — I10 Essential (primary) hypertension: Secondary | ICD-10-CM

## 2021-06-19 ENCOUNTER — Encounter (HOSPITAL_BASED_OUTPATIENT_CLINIC_OR_DEPARTMENT_OTHER): Payer: Self-pay

## 2021-06-19 ENCOUNTER — Other Ambulatory Visit: Payer: Self-pay

## 2021-06-19 ENCOUNTER — Other Ambulatory Visit: Payer: Self-pay | Admitting: Family Medicine

## 2021-06-19 ENCOUNTER — Ambulatory Visit (HOSPITAL_BASED_OUTPATIENT_CLINIC_OR_DEPARTMENT_OTHER)
Admission: RE | Admit: 2021-06-19 | Discharge: 2021-06-19 | Disposition: A | Discharge status: Home / Self Care | Payer: Medicare Other | Source: Ambulatory Visit | Attending: Family Medicine | Admitting: Family Medicine

## 2021-06-19 DIAGNOSIS — Z1231 Encounter for screening mammogram for malignant neoplasm of breast: Secondary | ICD-10-CM

## 2021-06-19 DIAGNOSIS — E2839 Other primary ovarian failure: Secondary | ICD-10-CM | POA: Diagnosis not present

## 2021-06-19 DIAGNOSIS — M85851 Other specified disorders of bone density and structure, right thigh: Secondary | ICD-10-CM | POA: Diagnosis not present

## 2021-07-11 ENCOUNTER — Encounter (HOSPITAL_BASED_OUTPATIENT_CLINIC_OR_DEPARTMENT_OTHER): Payer: Self-pay | Admitting: Cardiology

## 2021-07-11 ENCOUNTER — Other Ambulatory Visit: Payer: Self-pay

## 2021-07-11 ENCOUNTER — Ambulatory Visit (INDEPENDENT_AMBULATORY_CARE_PROVIDER_SITE_OTHER): Payer: Medicare Other | Admitting: Cardiology

## 2021-07-11 VITALS — BP 110/76 | HR 68 | Ht 63.0 in | Wt 174.0 lb

## 2021-07-11 DIAGNOSIS — E7800 Pure hypercholesterolemia, unspecified: Secondary | ICD-10-CM

## 2021-07-11 DIAGNOSIS — Z7189 Other specified counseling: Secondary | ICD-10-CM

## 2021-07-11 DIAGNOSIS — E119 Type 2 diabetes mellitus without complications: Secondary | ICD-10-CM | POA: Diagnosis not present

## 2021-07-11 DIAGNOSIS — I700 Atherosclerosis of aorta: Secondary | ICD-10-CM

## 2021-07-11 DIAGNOSIS — R0789 Other chest pain: Secondary | ICD-10-CM

## 2021-07-11 DIAGNOSIS — I10 Essential (primary) hypertension: Secondary | ICD-10-CM | POA: Diagnosis not present

## 2021-07-11 NOTE — Progress Notes (Signed)
Cardiology Office Note:    Date:  07/11/2021   ID:  Debra [name], DOB [birth year], MRN [id number]  PCP:  Carollee Herter, Alferd Apa, DO  Cardiologist:  Buford Dresser, MD  Referring MD: Carollee Herter, Alferd Apa, *   CC: follow up  History of Present Illness:    Debra [name] is a 65 y.o. female with a hx of hypertension, aortic atherosclerosis, hypothyroidism, tobacco abuse, type II diabetes who is seen for follow up today. I initially met her 12/31/19 as a new consult at the request of Ann Held, * for the evaluation and management of chest pain. Normal myoview 02/2020.  Pertinent history: Has severe pain after myelography, thought it was due to the dye, but denies anaphylaxis/contrast reaction.   Today: She is feeling very fatigued. Since her last visit, she continues to have a concerning central chest pain. This may be related to her frequent indigestion, which she sometimes develops with minimal eating.  When she lies down on her right side, her right chest and right UE become very painful when she changes position.   For her diet, she knows she has not been eating well. She often has yogurt, and a large amount of cinnamon graham crackers at a time. Lately she is trying to avoid [employer] frozen meals, and states she isn't sure what to eat. She has some success with dietary changes, as she has been incorporating more fish, kale greens and collard greens. Her PCP has recommended the DASH diet, but she is having difficulty finding the appropriate seasonings. Also, certain foods cause stomach pain, including pulled or baked chicken.  Recently she tried going to the gym, but this is difficult. She is frustrated with having no success with weight loss.  At this time she is unsure if she is taking both pravastatin and ezetimibe. She uses a pillbox at home.  She denies any palpitations, or shortness of breath. No lightheadedness, headaches, syncope, orthopnea, or PND. Also  has no lower extremity edema or exertional symptoms.  Past Medical History:  Diagnosis Date   Abdominal pain, right upper quadrant 04/30/2007   Allergies 02/03/2019   Anemia, iron deficiency 04/17/2017   Aortic atherosclerosis 10/10/2017   Atypical chest pain 08/31/2014   Back pain with radiculopathy 01/25/2010   Check xray --- ? Cause of leg weakness   Chronic pain of both shoulders 10/31/2020   Constipation    DDD (degenerative disc disease)    Diabetes mellitus type II, uncontrolled 81/82/9937   Diastolic dysfunction 16/96/7893   Epilepsy    Childhood seziures; pt reports that she grew out of them and they have not occurred since childhood   Essential hypertension 08/31/2010   Poorly controlled will alter medications, encouraged DASH diet, minimize caffeine and obtain adequate sleep. Report concerning symptoms and follow up as directed and as needed [employer] metoprolol Edema may be c   Fatigue 01/25/2010   Generalized anxiety disorder 04/22/2018   Stable co'nt meds   GERD (gastroesophageal reflux disease) 07/16/2007   Hearing loss 07/24/2012   Refer to hearing clinic   Hiatal hernia 07/16/2007   Hyperglycemia, fasting 05/04/2010   Hyperlipidemia 04/17/2017   Encouraged heart healthy diet, increase exercise, avoid trans fats, consider a krill oil cap daily   Hypothyroidism 11/02/2010   con't meds; Lab Results Component Value TSH 1.44 08/12/2017   Insomnia 04/30/2007   Knee pain 07/16/2007   Leg pain, bilateral 11/17/2008   Low back pain 07/16/2007  Major depressive disorder 01/30/2018   con't meds F/u counselor   Mixed connective tissue disease    with Raynaud's   Obstructive sleep apnea 01/30/2018   F/u with pulm; May be cause of some of her memory/concentration problems   Osteoarthritis    Palpitations 02/12/2008   Pansinusitis 01/30/2018   Peripheral vertigo 04/30/2007   Resolved rto prn   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    Current Medications: Current Outpatient Medications on File Prior to Visit  Medication Sig   aspirin EC 81 MG tablet Take 1 tablet (81 mg total) by mouth daily. Swallow whole.   azelastine (ASTELIN) 0.1 % nasal spray Place 2 sprays into both nostrils 2 (two) times daily. Use in each nostril as directed   blood glucose meter kit and supplies KIT Dispense based on patient and insurance preference. Use up to four times daily as directed. (FOR ICD-9 250.00, 250.01).   COVID-19 mRNA Vac-TriS, Pfizer, (PFIZER-BIONT COVID-19 VAC-TRIS) SUSP injection Inject into the muscle.   ezetimibe (ZETIA) 10 MG tablet Take 1 tablet (10 mg total) by mouth at bedtime.   gabapentin (NEURONTIN) 600 MG tablet TAKE 1 TABLET BY MOUTH 3  TIMES DAILY   hydrochlorothiazide (HYDRODIURIL) 25 MG tablet 1 po qd   levothyroxine (SYNTHROID) 50 MCG tablet Take 1 tablet (50 mcg total) by mouth daily.   losartan (COZAAR) 100 MG tablet Take 1 tablet (100 mg total) by mouth daily.   metFORMIN (GLUCOPHAGE) 500 MG tablet Take 1 tablet (500 mg total) by mouth 2 (two) times daily with a meal.   methocarbamol (ROBAXIN) 500 MG tablet Take 1 tablet (500 mg total) by mouth in the morning and at bedtime.   metoprolol succinate (TOPROL-XL) 100 MG 24 hr tablet Take 1 tablet (100 mg total) by mouth daily. Take with or immediately following a meal.   metroNIDAZOLE (FLAGYL) 500 MG tablet Take 1 tablet (500 mg total) by mouth 2 (two) times daily with a meal. DO NOT CONSUME ALCOHOL WHILE TAKING THIS MEDICATION.   montelukast (SINGULAIR) 10 MG tablet Take 1 tablet  (10 mg total) by mouth at bedtime.   NON FORMULARY [facility]  Scar Cream -  Verapamil 10%, Pentoxifylline 5% Apply 1-2 grams to affected area 3-4 times daily Qty. 120 gm 3 refills   pantoprazole (PROTONIX) 40 MG tablet Take 1 tablet (40 mg total) by mouth daily.   PARoxetine (PAXIL) 30 MG tablet Take 1 tablet (30 mg total) by mouth daily.   pravastatin (PRAVACHOL) 40 MG tablet Take 1 tablet (40 mg total) by mouth daily.   predniSONE (DELTASONE) 20 MG tablet Take 2 tablets (40 mg total) by mouth daily.   solifenacin (VESICARE) 5 MG tablet Take 1 tablet (5 mg total) by mouth daily.   Vitamin D, Ergocalciferol, (DRISDOL) 1.25 MG (50000 UT) CAPS capsule Take 1 capsule (50,000 Units total) by mouth every 7 (seven) days.   No current facility-administered medications on file prior to visit.  Allergies:   Penicillins and Codeine   Social History   Tobacco Use   Smoking status: Every Day    Packs/day: 0.75    Years: 44.00    Pack years: 33.00    Types: Cigarettes   Smokeless tobacco: Never  Substance Use Topics   Alcohol use: Yes    Comment: 2-3 drinks per week (beer/wine)   Drug use: No    Family History: family history includes Anxiety disorder in her mother; Arthritis in her mother; Breast cancer in her mother; Cancer in her mother; Colon cancer in her maternal grandfather; Depression in her mother; Diabetes in her sister; Heart disease in her father; Heart disease (age of onset: 66) in her mother; Hyperlipidemia in her mother and sister; Hypertension in her father and sister; Memory loss in her maternal aunt; Stroke in her father; Thyroid disease in her mother. father has heart issues, deceased. She is not sure of age of onset. Brother sees a heart doctor, unclear reason.  ROS:   Please see the history of present illness.   (+) Fatigue (+) Chest pain (+) Indigestion Additional pertinent ROS otherwise unremarkable.  EKGs/Labs/Other Studies Reviewed:    The  following studies were reviewed today:  Myoview 03/06/20 Nuclear stress EF: 59%. The left ventricular ejection fraction is normal (55-65%). There was no ST segment deviation noted during stress. This is a low risk study. There is no evidence of ischemia and no evidence of previous infarction. The study is normal.  EKG:  EKG is personally reviewed.   07/11/2021: Normal sinus rhythm at 68 bpm, PRWP 01/23/2021: normal sinus rhythm at 74 bpm  Recent Labs: 05/25/2021: ALT 14; BUN 16; Creat 0.80; Hemoglobin 11.6; Platelets 260; Potassium 3.9; Sodium 139; TSH 1.26  Recent Lipid Panel    Component Value Date/Time   CHOL 168 04/20/2021 1415   CHOL 206 (H) 07/22/2018 1057   TRIG 91 04/20/2021 1415   HDL 47 (L) 04/20/2021 1415   HDL 48 07/22/2018 1057   CHOLHDL 3.6 04/20/2021 1415   VLDL 17.8 02/27/2021 1418   LDLCALC 102 (H) 04/20/2021 1415   LDLDIRECT 126.3 06/06/2011 1533    Physical Exam:    VS:  BP 110/76 (BP Location: Left Arm, Patient Position: Sitting, Cuff Size: Normal)   Pulse 68   Ht _0  (1.6 m)   Wt 174 lb (78.9 kg)   BMI 30.82 kg/m     Wt Readings from Last 3 Encounters:  07/11/21 174 lb (78.9 kg)  05/25/21 176 lb (79.8 kg)  04/20/21 179 lb 12.8 oz (81.6 kg)    GEN: Well nourished, well developed in no acute distress HEENT: Normal, moist mucous membranes NECK: No JVD CARDIAC: regular rhythm, normal S1 and S2, no rubs or gallops. No murmur. VASCULAR: Radial and DP pulses 2+ bilaterally. No carotid bruits RESPIRATORY:  Clear to auscultation without rales, wheezing or rhonchi  ABDOMEN: Soft, non-tender, non-distended MUSCULOSKELETAL:  Ambulates independently SKIN: Warm and dry, no edema NEUROLOGIC:  Alert and oriented x 3. No focal neuro deficits noted. PSYCHIATRIC:  Normal affect   ASSESSMENT:    1. Atypical chest pain   2. Type 2 diabetes mellitus without complication, without long-term current use of insulin ([place])   3. Aortic atherosclerosis ([place])   4.  Essential hypertension   5. Pure hypercholesterolemia   6. Cardiac risk counseling   7. Counseling on health promotion and disease prevention     PLAN:    chest discomfort -atypical symptoms -reassuring stress test -discussed  potential non-cardiac etiologies of her symptoms -discussed red flag warning signs that need immediate medical attention  Hypertension: at goal of <130/80 today -continue current medications  Type II diabetes: -on metformin -consider SGLT2i or GLP1RA if additional med needed given aortic atherosclerosis  Hypercholesterolemia Aortic atherosclerosis:  -continue aspirin, denies bleeding issues -now tolerating pravastatin. However, she doesn't know if she is currently taking pravastatin, ezetimibe, or both. Uses a pill box. Recommended she check against her med list the next time she fills her pill box to see what she is taking.  Cardiac risk counseling and prevention recommendations: -recommend heart healthy/Mediterranean diet, with whole grains, fruits, vegetable, fish, lean meats, nuts, and olive oil. Limit salt. -recommend moderate walking, 3-5 times/week for 30-50 minutes each session. Aim for at least 150 minutes.week. Goal should be pace of 3 miles/hours, or walking 1.5 miles in 30 minutes -recommend avoidance of tobacco products. Avoid excess alcohol. -ASCVD risk score: The 10-year ASCVD risk score (Arnett DK, et al., 2019) is: 24.4%   Values used to calculate the score:     Age: 14 years     Sex: Female     Is Non-Hispanic African American: Yes     Diabetic: Yes     Tobacco smoker: Yes     Systolic Blood Pressure: 329 mmHg     Is BP treated: Yes     HDL Cholesterol: 47 mg/dL     Total Cholesterol: 168 mg/dL    Plan for follow up: 1 year or sooner as needed   Medication Adjustments/Labs and Tests Ordered: Current medicines are reviewed at length with the patient today.  Concerns regarding medicines are outlined above.   Orders Placed This  Encounter  Procedures   EKG 12-Lead    No orders of the defined types were placed in this encounter.  Patient Instructions  Medication Instructions:  Your physician recommends that you continue on your current medications as directed. Please refer to the Current Medication list given to you today.  *If you need a refill on your cardiac medications before your next appointment, please call your pharmacy*  Follow-Up: At [facility], you and your health needs are our priority.  As part of our continuing mission to provide you with exceptional heart care, we have created designated Provider Care Teams.  These Care Teams include your primary Cardiologist (physician) and Advanced Practice Providers (APPs -  Physician Assistants and Nurse Practitioners) who all work together to provide you with the care you need, when you need it.  We recommend signing up for the patient portal called "MyChart".  Sign up information is provided on this After Visit Summary.  MyChart is used to connect with patients for Virtual Visits (Telemedicine).  Patients are able to view lab/test results, encounter notes, upcoming appointments, etc.  Non-urgent messages can be sent to your provider as well.   To learn more about what you can do with MyChart, go to [email or web address].    Your next appointment:   12 month(s)  The format for your next appointment:   In Person  Provider:   Buford Dresser, MD     [facility] Stumpf,acting as a scribe for Buford Dresser, MD.,have documented all relevant documentation on the behalf of Buford Dresser, MD,as directed by  Buford Dresser, MD while in the presence of Buford Dresser, MD.  I, Buford Dresser, MD, have reviewed all documentation for this visit. The documentation on 07/11/21 for the exam, diagnosis, procedures, and orders are all accurate and complete.  Signed, Buford Dresser, MD PhD 07/11/2021 8:20 PM    [facility]

## 2021-07-11 NOTE — Patient Instructions (Signed)
Medication Instructions:  Your physician recommends that you continue on your current medications as directed. Please refer to the Current Medication list given to you today.  *If you need a refill on your cardiac medications before your next appointment, please call your pharmacy*  Follow-Up: At [facility], you and your health needs are our priority.  As part of our continuing mission to provide you with exceptional heart care, we have created designated Provider Care Teams.  These Care Teams include your primary Cardiologist (physician) and Advanced Practice Providers (APPs -  Physician Assistants and Nurse Practitioners) who all work together to provide you with the care you need, when you need it.  We recommend signing up for the patient portal called "MyChart".  Sign up information is provided on this After Visit Summary.  MyChart is used to connect with patients for Virtual Visits (Telemedicine).  Patients are able to view lab/test results, encounter notes, upcoming appointments, etc.  Non-urgent messages can be sent to your provider as well.   To learn more about what you can do with MyChart, go to [email or web address].    Your next appointment:   12 month(s)  The format for your next appointment:   In Person  Provider:   Buford Dresser, MD

## 2021-07-12 ENCOUNTER — Other Ambulatory Visit: Payer: Self-pay

## 2021-07-12 ENCOUNTER — Ambulatory Visit (AMBULATORY_SURGERY_CENTER): Payer: Self-pay

## 2021-07-12 VITALS — Ht 63.0 in | Wt 174.0 lb

## 2021-07-12 DIAGNOSIS — Z1211 Encounter for screening for malignant neoplasm of colon: Secondary | ICD-10-CM

## 2021-07-12 MED ORDER — NA SULFATE-K SULFATE-MG SULF 17.5-3.13-1.6 GM/177ML PO SOLN: 1.0000 | Freq: Once | ORAL | 0 refills | Status: AC

## 2021-07-12 NOTE — Progress Notes (Signed)
Denies allergies to eggs or soy products. Denies complication of anesthesia or sedation. Denies use of weight loss medication. Denies use of O2.   Emmi instructions given for colonoscopy.  

## 2021-07-25 ENCOUNTER — Telehealth: Payer: Self-pay | Admitting: Gastroenterology

## 2021-07-25 NOTE — Telephone Encounter (Signed)
Inbound call from patient requesting a call from a nurse please.  Has additional questions in regards to prep for procedure.

## 2021-07-25 NOTE — Telephone Encounter (Signed)
Returned patient phone call, answered questions answered regarding diet and prep instructions.

## 2021-07-26 ENCOUNTER — Ambulatory Visit (AMBULATORY_SURGERY_CENTER): Payer: Medicare Other | Admitting: Gastroenterology

## 2021-07-26 ENCOUNTER — Other Ambulatory Visit: Payer: Self-pay

## 2021-07-26 ENCOUNTER — Encounter: Payer: Self-pay | Admitting: Gastroenterology

## 2021-07-26 VITALS — BP 131/74 | HR 57 | Temp 98.0°F | Resp 13 | Ht 63.0 in | Wt 174.0 lb

## 2021-07-26 DIAGNOSIS — D123 Benign neoplasm of transverse colon: Secondary | ICD-10-CM | POA: Diagnosis not present

## 2021-07-26 DIAGNOSIS — E119 Type 2 diabetes mellitus without complications: Secondary | ICD-10-CM | POA: Diagnosis not present

## 2021-07-26 DIAGNOSIS — Z1211 Encounter for screening for malignant neoplasm of colon: Secondary | ICD-10-CM

## 2021-07-26 DIAGNOSIS — K635 Polyp of colon: Secondary | ICD-10-CM | POA: Diagnosis not present

## 2021-07-26 DIAGNOSIS — G4733 Obstructive sleep apnea (adult) (pediatric): Secondary | ICD-10-CM | POA: Diagnosis not present

## 2021-07-26 DIAGNOSIS — G2581 Restless legs syndrome: Secondary | ICD-10-CM | POA: Diagnosis not present

## 2021-07-26 DIAGNOSIS — D124 Benign neoplasm of descending colon: Secondary | ICD-10-CM

## 2021-07-26 MED ORDER — SODIUM CHLORIDE 0.9 % IV SOLN: 500.0000 mL | Freq: Once | INTRAVENOUS | Status: DC

## 2021-07-26 NOTE — Progress Notes (Signed)
See 07/11/2021 H&P, no changes.

## 2021-07-26 NOTE — Op Note (Signed)
[facility] Patient Name: Debra [name] Procedure Date: 07/26/2021 11:16 AM MRN: [id number] Endoscopist: Ladene Artist , MD Age: 65 Referring MD:  Date of Birth: 1956/05/21 Gender: Female Account #: [id number] Procedure:                Colonoscopy Indications:              Screening for colorectal malignant neoplasm Medicines:                Monitored Anesthesia Care Procedure:                Pre-Anesthesia Assessment:                           - Prior to the procedure, a History and Physical                            was performed, and patient medications and                            allergies were reviewed. The patient's tolerance of                            previous anesthesia was also reviewed. The risks                            and benefits of the procedure and the sedation                            options and risks were discussed with the patient.                            All questions were answered, and informed consent                            was obtained. Prior Anticoagulants: The patient has                            taken no previous anticoagulant or antiplatelet                            agents. ASA Grade Assessment: II - A patient with                            mild systemic disease. After reviewing the risks                            and benefits, the patient was deemed in                            satisfactory condition to undergo the procedure.                           After obtaining informed consent, the colonoscope  was passed under direct vision. Throughout the                            procedure, the patient's blood pressure, pulse, and                            oxygen saturations were monitored continuously. The                            CF [id number] #[id number] was introduced through the anus                            and advanced to the the cecum, identified by                            appendiceal  orifice and ileocecal valve. The                            ileocecal valve, appendiceal orifice, and rectum                            were photographed. The quality of the bowel                            preparation was good. The colonoscopy was performed                            without difficulty. The patient tolerated the                            procedure well. Scope In: 11:29:28 AM Scope Out: 11:49:29 AM Scope Withdrawal Time: 0 hours 14 minutes 46 seconds  Total Procedure Duration: 0 hours 20 minutes 1 second  Findings:                 The perianal and digital rectal examinations were                            normal.                           Two sessile polyps were found in the descending                            colon and transverse colon. The polyps were 5 to 8                            mm in size. These polyps were removed with a cold                            snare. Resection and retrieval were complete.                           The exam was otherwise without abnormality on  direct and retroflexion views. Complications:            No immediate complications. Estimated blood loss:                            None. Estimated Blood Loss:     Estimated blood loss: none. Impression:               - Two 5 to 8 mm polyps in the descending colon and                            in the transverse colon, removed with a cold snare.                            Resected and retrieved.                           - The examination was otherwise normal on direct                            and retroflexion views. Recommendation:           - Repeat colonoscopy after studies are complete for                            surveillance based on pathology results.                           - Patient has a contact number available for                            emergencies. The signs and symptoms of potential                            delayed complications were  discussed with the                            patient. Return to normal activities tomorrow.                            Written discharge instructions were provided to the                            patient.                           - Resume previous diet.                           - Continue present medications.                           - Await pathology results. Ladene Artist, MD 07/26/2021 11:55:56 AM This report has been signed electronically.

## 2021-07-26 NOTE — Patient Instructions (Signed)
Read all of the handouts given to you by your recovery room nurse. ? ?YOU HAD AN ENDOSCOPIC PROCEDURE TODAY AT THE [facility]:   Refer to the procedure report that was given to you for any specific questions about what was found during the examination.  If the procedure report does not answer your questions, please call your gastroenterologist to clarify.  If you requested that your care partner not be given the details of your procedure findings, then the procedure report has been included in a sealed envelope for you to review at your convenience later. ? ?YOU SHOULD EXPECT: Some feelings of bloating in the abdomen. Passage of more gas than usual.  Walking can help get rid of the air that was put into your GI tract during the procedure and reduce the bloating. If you had a lower endoscopy (such as a colonoscopy or flexible sigmoidoscopy) you may notice spotting of blood in your stool or on the toilet paper. If you underwent a bowel prep for your procedure, you may not have a normal bowel movement for a few days. ? ?Please Note:  You might notice some irritation and congestion in your nose or some drainage.  This is from the oxygen used during your procedure.  There is no need for concern and it should clear up in a day or so. ? ?SYMPTOMS TO REPORT IMMEDIATELY: ? ?Following lower endoscopy (colonoscopy or flexible sigmoidoscopy): ? Excessive amounts of blood in the stool ? Significant tenderness or worsening of abdominal pains ? Swelling of the abdomen that is new, acute ? Fever of 100?F or higher ? ?  ?For urgent or emergent issues, a gastroenterologist can be reached at any hour by calling [phone]. ?Do not use MyChart messaging for urgent concerns.  ? ? ?DIET:  We do recommend a small meal at first, but then you may proceed to your regular diet.  Drink plenty of fluids but you should avoid alcoholic beverages for 24 hours. ? ?ACTIVITY:  You should plan to take it easy for the rest of today  and you should NOT DRIVE or use heavy machinery until tomorrow (because of the sedation medicines used during the test).   ? ?FOLLOW UP: ?Our staff will call the number listed on your records 48-72 hours following your procedure to check on you and address any questions or concerns that you may have regarding the information given to you following your procedure. If we do not reach you, we will leave a message.  We will attempt to reach you two times.  During this call, we will ask if you have developed any symptoms of COVID 19. If you develop any symptoms (ie: fever, flu-like symptoms, shortness of breath, cough etc.) before then, please call [phone].  If you test positive for Covid 19 in the 2 weeks post procedure, please call and report this information to us.   ? ?If any biopsies were taken you will be contacted by phone or by letter within the next 1-3 weeks.  Please call us at [phone] if you have not heard about the biopsies in 3 weeks.  ? ? ?SIGNATURES/CONFIDENTIALITY: ?You and/or your care partner have signed paperwork which will be entered into your electronic medical record.  These signatures attest to the fact that that the information above on your After Visit Summary has been reviewed and is understood.  Full responsibility of the confidentiality of this discharge information lies with you and/or your care-partner.  ?

## 2021-07-26 NOTE — Progress Notes (Signed)
Report to PACU, RN, vss, BBS= Clear.  

## 2021-07-26 NOTE — Progress Notes (Signed)
Patient's care partner went to [facility] [employer].  She was told not to leave.

## 2021-07-26 NOTE — Progress Notes (Signed)
Called to room to assist during endoscopic procedure.  Patient ID and intended procedure confirmed with present staff. Received instructions for my participation in the procedure from the performing physician.  

## 2021-07-26 NOTE — Progress Notes (Signed)
VS by CW  I have reviewed the patient's medical history in detail and updated the computerized patient record.  

## 2021-07-30 ENCOUNTER — Telehealth: Payer: Self-pay | Admitting: *Deleted

## 2021-07-30 NOTE — Telephone Encounter (Signed)
  Follow up Call-  Call back number 07/26/2021  Post procedure Call Back phone  # [phone]  Permission to leave phone message Yes  Some recent data might be hidden     Patient questions:  Do you have a fever, pain , or abdominal swelling? No. Pain Score  0 *  Have you tolerated food without any problems? Yes.    Have you been able to return to your normal activities? Yes.    Do you have any questions about your discharge instructions: Diet   No. Medications  No. Follow up visit  No.  Do you have questions or concerns about your Care? No.  Actions: * If pain score is 4 or above: No action needed, pain <4.  Have you developed a fever since your procedure? no  2.   Have you had an respiratory symptoms (SOB or cough) since your procedure? no  3.   Have you tested positive for COVID 19 since your procedure no  4.   Have you had any family members/close contacts diagnosed with the COVID 19 since your procedure?  no   If yes to any of these questions please route to Joylene John, RN and Joella Prince, RN no

## 2021-07-31 ENCOUNTER — Other Ambulatory Visit: Payer: Self-pay | Admitting: Family Medicine

## 2021-07-31 DIAGNOSIS — I10 Essential (primary) hypertension: Secondary | ICD-10-CM

## 2021-08-17 ENCOUNTER — Other Ambulatory Visit: Payer: Self-pay | Admitting: Family Medicine

## 2021-08-17 DIAGNOSIS — E1165 Type 2 diabetes mellitus with hyperglycemia: Secondary | ICD-10-CM

## 2021-08-21 ENCOUNTER — Encounter: Payer: Self-pay | Admitting: Gastroenterology

## 2021-08-26 ENCOUNTER — Other Ambulatory Visit: Payer: Self-pay | Admitting: Family Medicine

## 2021-08-26 DIAGNOSIS — I10 Essential (primary) hypertension: Secondary | ICD-10-CM

## 2021-08-27 ENCOUNTER — Telehealth: Payer: Self-pay | Admitting: Family Medicine

## 2021-08-27 NOTE — Telephone Encounter (Signed)
Spoke with patient regarding blood sugars.  Patient states she has eaten poorly over the last few weeks due to working more hours.  She reports increased fatigue and high blood sugars.  Advised patient to check blood sugars BID, log and bring to appointment. Encouraged to journal food intake with time of meal.  Reviewed low carb, low sugar diet with patient. Voiced understanding.  Patient scheduled with PCP on Thursday 08/30/21.

## 2021-08-27 NOTE — Telephone Encounter (Signed)
Pt called and stated she would like a she has some questions regarding her blood sugar and readings.

## 2021-08-30 ENCOUNTER — Ambulatory Visit (INDEPENDENT_AMBULATORY_CARE_PROVIDER_SITE_OTHER): Payer: Medicare Other | Admitting: Family Medicine

## 2021-08-30 ENCOUNTER — Other Ambulatory Visit: Payer: Self-pay

## 2021-08-30 ENCOUNTER — Encounter: Payer: Self-pay | Admitting: Family Medicine

## 2021-08-30 VITALS — BP 132/80 | HR 74 | Temp 98.8°F | Resp 18 | Ht 63.0 in | Wt 174.8 lb

## 2021-08-30 DIAGNOSIS — M67431 Ganglion, right wrist: Secondary | ICD-10-CM | POA: Diagnosis not present

## 2021-08-30 DIAGNOSIS — Z8639 Personal history of other endocrine, nutritional and metabolic disease: Secondary | ICD-10-CM

## 2021-08-30 DIAGNOSIS — E1165 Type 2 diabetes mellitus with hyperglycemia: Secondary | ICD-10-CM | POA: Diagnosis not present

## 2021-08-30 DIAGNOSIS — E1169 Type 2 diabetes mellitus with other specified complication: Secondary | ICD-10-CM

## 2021-08-30 DIAGNOSIS — I10 Essential (primary) hypertension: Secondary | ICD-10-CM | POA: Diagnosis not present

## 2021-08-30 DIAGNOSIS — E785 Hyperlipidemia, unspecified: Secondary | ICD-10-CM | POA: Diagnosis not present

## 2021-08-30 DIAGNOSIS — Z23 Encounter for immunization: Secondary | ICD-10-CM | POA: Diagnosis not present

## 2021-08-30 DIAGNOSIS — M65311 Trigger thumb, right thumb: Secondary | ICD-10-CM

## 2021-08-30 NOTE — Patient Instructions (Signed)
Carbohydrate Counting for Diabetes Mellitus, Adult Carbohydrate counting is a method of keeping track of how many carbohydrates you eat. Eating carbohydrates naturally increases the amount of sugar (glucose) in the blood. Counting how many carbohydrates you eat improves your blood glucose control, which helps you manage your diabetes. It is important to know how many carbohydrates you can safely have in each meal. This is different for every person. A dietitian can help you make a meal plan and calculate how many carbohydrates you should have at each meal and snack. What foods contain carbohydrates? Carbohydrates are found in the following foods: Grains, such as breads and cereals. Dried beans and soy products. Starchy vegetables, such as potatoes, peas, and corn. Fruit and fruit juices. Milk and yogurt. Sweets and snack foods, such as cake, cookies, candy, chips, and soft drinks. How do I count carbohydrates in foods? There are two ways to count carbohydrates in food. You can read food labels or learn standard serving sizes of foods. You can use either of the methods or a combination of both. Using the Nutrition Facts label The Nutrition Facts list is included on the labels of almost all packaged foods and beverages in the [place] It includes: The serving size. Information about nutrients in each serving, including the grams (g) of carbohydrate per serving. To use the Nutrition Facts: Decide how many servings you will have. Multiply the number of servings by the number of carbohydrates per serving. The resulting number is the total amount of carbohydrates that you will be having. Learning the standard serving sizes of foods When you eat carbohydrate foods that are not packaged or do not include Nutrition Facts on the label, you need to measure the servings in order to count the amount of carbohydrates. Measure the foods that you will eat with a food scale or measuring cup, if needed. Decide how  many standard-size servings you will eat. Multiply the number of servings by 15. For foods that contain carbohydrates, one serving equals 15 g of carbohydrates. For example, if you eat 2 cups or 10 oz (300 g) of strawberries, you will have eaten 2 servings and 30 g of carbohydrates (2 servings x 15 g = 30 g). For foods that have more than one food mixed, such as soups and casseroles, you must count the carbohydrates in each food that is included. The following list contains standard serving sizes of common carbohydrate-rich foods. Each of these servings has about 15 g of carbohydrates: 1 slice of bread. 1 six-inch (15 cm) tortilla. ? cup or 2 oz (53 g) cooked rice or pasta.  cup or 3 oz (85 g) cooked or canned, drained and rinsed beans or lentils.  cup or 3 oz (85 g) starchy vegetable, such as peas, corn, or squash.  cup or 4 oz (120 g) hot cereal.  cup or 3 oz (85 g) boiled or mashed potatoes, or  or 3 oz (85 g) of a large baked potato.  cup or 4 fl oz (118 mL) fruit juice. 1 cup or 8 fl oz (237 mL) milk. 1 small or 4 oz (106 g) apple.  or 2 oz (63 g) of a medium banana. 1 cup or 5 oz (150 g) strawberries. 3 cups or 1 oz (24 g) popped popcorn. What is an example of carbohydrate counting? To calculate the number of carbohydrates in this sample meal, follow the steps shown below. Sample meal 3 oz (85 g) chicken breast. ? cup or 4 oz (106 g) brown   rice.  cup or 3 oz (85 g) corn. 1 cup or 8 fl oz (237 mL) milk. 1 cup or 5 oz (150 g) strawberries with sugar-free whipped topping. Carbohydrate calculation Identify the foods that contain carbohydrates: Rice. Corn. Milk. Strawberries. Calculate how many servings you have of each food: 2 servings rice. 1 serving corn. 1 serving milk. 1 serving strawberries. Multiply each number of servings by 15 g: 2 servings rice x 15 g = 30 g. 1 serving corn x 15 g = 15 g. 1 serving milk x 15 g = 15 g. 1 serving strawberries x 15 g = 15  g. Add together all of the amounts to find the total grams of carbohydrates eaten: 30 g + 15 g + 15 g + 15 g = 75 g of carbohydrates total. What are tips for following this plan? Shopping Develop a meal plan and then make a shopping list. Buy fresh and frozen vegetables, fresh and frozen fruit, dairy, eggs, beans, lentils, and whole grains. Look at food labels. Choose foods that have more fiber and less sugar. Avoid processed foods and foods with added sugars. Meal planning Aim to have the same amount of carbohydrates at each meal and for each snack time. Plan to have regular, balanced meals and snacks. Where to find more information American Diabetes Association: www.diabetes.org Centers for Disease Control and Prevention: www.cdc.gov Summary Carbohydrate counting is a method of keeping track of how many carbohydrates you eat. Eating carbohydrates naturally increases the amount of sugar (glucose) in the blood. Counting how many carbohydrates you eat improves your blood glucose control, which helps you manage your diabetes. A dietitian can help you make a meal plan and calculate how many carbohydrates you should have at each meal and snack. This information is not intended to replace advice given to you by your health care provider. Make sure you discuss any questions you have with your health care provider. Document Revised: 10/14/2019 Document Reviewed: 10/15/2019 Elsevier Patient Education  2021 Elsevier Inc.  

## 2021-08-30 NOTE — Assessment & Plan Note (Signed)
Check labs 

## 2021-08-30 NOTE — Progress Notes (Signed)
Subjective:   By signing my name below, I, [name], attest that this documentation has been prepared under the direction and in the presence of Dr. Roma Schanz, DO. 08/30/2021    Patient ID: Debra [name], female    DOB: [birth year], 64 y.o.   MRN: [id number]  Chief Complaint  Patient presents with   Diabetes    Pt states having 220-240 and comes down to 119 to 140   Follow-up    Diabetes  Patient is in today for a office visit.   Diabetes- Her blood sugars are elevated at home. She reports her recent measurements typically range from 180-200.   Lab Results  Component Value Date   HGBA1C 6.5 (H) 04/20/2021   Hand pain- She complains of right hand pain. She reports having a know on her hand that is swelling. The swelling has reduced but it is still painful at this time.  Vision- She reports having strikes of light across her vision. She has not informed or made and appointment with her vision care specialist.  Weight- She does not see a healthy weight and wellness program due to her insurance not paying for it. She is struggling to find ingredients to cook for the DASH diet.  Immunizations- She reports receiving a flu vaccine at her [facility]. She has 3 Covid-19 vaccines at this time.    Past Medical History:  Diagnosis Date   Abdominal pain, right upper quadrant 04/30/2007   Allergies 02/03/2019   Allergy    Anemia, iron deficiency 04/17/2017   Aortic atherosclerosis 10/10/2017   Atypical chest pain 08/31/2014   Back pain with radiculopathy 01/25/2010   Check xray --- ? Cause of leg weakness   Chronic pain of both shoulders 10/31/2020   Constipation    DDD (degenerative disc disease)    Diabetes mellitus type II, uncontrolled 27/51/7001   Diastolic dysfunction 74/94/4967   Epilepsy    Childhood seziures; pt reports that she grew out of them and they have not occurred since childhood   Essential hypertension 08/31/2010   Poorly controlled will alter  medications, encouraged DASH diet, minimize caffeine and obtain adequate sleep. Report concerning symptoms and follow up as directed and as needed [employer] metoprolol Edema may be c   Fatigue 01/25/2010   Generalized anxiety disorder 04/22/2018   Stable co'nt meds   GERD (gastroesophageal reflux disease) 07/16/2007   Hearing loss 07/24/2012   Refer to hearing clinic   Hiatal hernia 07/16/2007   Hyperglycemia, fasting 05/04/2010   Hyperlipidemia 04/17/2017   Encouraged heart healthy diet, increase exercise, avoid trans fats, consider a krill oil cap daily   Hypothyroidism 11/02/2010   con't meds; Lab Results Component Value TSH 1.44 08/12/2017   Insomnia 04/30/2007   Knee pain 07/16/2007   Leg pain, bilateral 11/17/2008   Low back pain 07/16/2007   Major depressive disorder 01/30/2018   con't meds F/u counselor   Mixed connective tissue disease    with Raynaud's   Obstructive sleep apnea 01/30/2018   F/u with pulm; May be cause of some of her memory/concentration problems   Osteoarthritis    Osteoporosis    Palpitations 02/12/2008   Pansinusitis 01/30/2018   Peripheral vertigo 04/30/2007   Resolved rto prn   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    Family History  Problem Relation Age of Onset   Breast cancer Mother        possibly 23's   Arthritis Mother        rheumatoid   Cancer Mother        breast   Heart disease Mother 33       MI   Hyperlipidemia Mother    Thyroid disease Mother    Depression Mother    Anxiety disorder Mother    Heart  disease Father        MI   Hypertension Father    Stroke Father    Diabetes Sister    Hypertension Sister    Hyperlipidemia Sister    Memory loss Maternal Aunt    Colon cancer Maternal Grandfather    Esophageal cancer Neg Hx    Rectal cancer Neg Hx    Stomach cancer Neg Hx     Social History   Socioeconomic History   Marital status: Legally Separated    Spouse name: Not on file   Number of children: 3   Years of education: 14   Highest education level: Some college, no degree  Occupational History   Occupation: Security  Tobacco Use   Smoking status: Every Day    Packs/day: 0.75    Years: 44.00    Pack years: 33.00    Types: Cigarettes   Smokeless tobacco: Never  Substance and Sexual Activity   Alcohol use: Yes    Comment: 2-3 drinks per week (beer/wine)   Drug use: No   Sexual activity: Yes    Partners: Male    Birth control/protection: Post-menopausal  Other Topics Concern   Not on file  Social History Narrative   Not on file   Social Determinants of Health   Financial Resource Strain: Not on file  Food Insecurity: Not on file  Transportation Needs: Not on file  Physical Activity: Not on file  Stress: Not on file  Social Connections: Not on file  Intimate Partner Violence: Not on file    Outpatient Medications Prior to Visit  Medication Sig Dispense Refill   azelastine (ASTELIN) 0.1 % nasal spray Place 2 sprays into both nostrils 2 (two) times daily. Use in each nostril as directed 30 mL 5   blood glucose meter kit and supplies KIT Dispense based on patient and insurance preference. Use up to four times daily as directed. (FOR ICD-9 250.00, 250.01). 1 each 0   COVID-19 mRNA Vac-TriS, Pfizer, (PFIZER-BIONT COVID-19 VAC-TRIS) SUSP injection Inject into the muscle. 0.3 mL 0   gabapentin (NEURONTIN) 600 MG tablet TAKE 1 TABLET BY MOUTH 3  TIMES DAILY 270 tablet 3   hydrochlorothiazide (HYDRODIURIL) 25 MG tablet TAKE 1 TABLET BY MOUTH  DAILY 90 tablet 1    levothyroxine (SYNTHROID) 50 MCG tablet Take 1 tablet (50 mcg total) by mouth daily. 90 tablet 3   losartan (COZAAR) 100 MG tablet TAKE 1 TABLET BY MOUTH ONCE DAILY 90 tablet 1   metFORMIN (GLUCOPHAGE) 500 MG tablet TAKE 1 TABLET BY MOUTH  DAILY WITH BREAKFAST 90 tablet 0   methocarbamol (ROBAXIN) 500 MG tablet Take 1 tablet (500 mg total)  by mouth in the morning and at bedtime. 60 tablet 3   metoprolol succinate (TOPROL-XL) 100 MG 24 hr tablet Take 1 tablet (100 mg total) by mouth daily. Take with or immediately following a meal. 90 tablet 3   montelukast (SINGULAIR) 10 MG tablet Take 1 tablet (10 mg total) by mouth at bedtime. 30 tablet 11   PARoxetine (PAXIL) 30 MG tablet Take 1 tablet (30 mg total) by mouth daily. 90 tablet 1   pravastatin (PRAVACHOL) 40 MG tablet Take 1 tablet (40 mg total) by mouth daily. 90 tablet 0   solifenacin (VESICARE) 5 MG tablet Take 1 tablet (5 mg total) by mouth daily. 30 tablet 2   Vitamin D, Ergocalciferol, (DRISDOL) 1.25 MG (50000 UT) CAPS capsule Take 1 capsule (50,000 Units total) by mouth every 7 (seven) days. 4 capsule 0   aspirin EC 81 MG tablet Take 1 tablet (81 mg total) by mouth daily. Swallow whole. (Patient not taking: No sig reported) 90 tablet 3   ezetimibe (ZETIA) 10 MG tablet Take 1 tablet (10 mg total) by mouth at bedtime. (Patient not taking: No sig reported) 30 tablet 2   NON FORMULARY [facility]  Scar Cream -  Verapamil 10%, Pentoxifylline 5% Apply 1-2 grams to affected area 3-4 times daily Qty. 120 gm 3 refills (Patient not taking: No sig reported)     predniSONE (DELTASONE) 20 MG tablet Take 2 tablets (40 mg total) by mouth daily. (Patient not taking: No sig reported) 10 tablet 0   Facility-Administered Medications Prior to Visit  Medication Dose Route Frequency Provider Last Rate Last Admin   0.9 %  sodium chloride infusion  500 mL Intravenous Once Meryl Dare, MD        Allergies  Allergen Reactions   Penicillins     Codeine Rash and Other (See Comments)    dizziness    Review of Systems  Eyes:        (+)streaks of light across vision  Musculoskeletal:  Positive for myalgias (Right hand pain).       (+)lump in right hand      Objective:    Physical Exam Constitutional:      General: She is not in acute distress.    Appearance: Normal appearance. She is not ill-appearing.  HENT:     Head: Normocephalic and atraumatic.     Right Ear: External ear normal.     Left Ear: External ear normal.  Eyes:     Extraocular Movements: Extraocular movements intact.     Pupils: Pupils are equal, round, and reactive to light.  Cardiovascular:     Rate and Rhythm: Normal rate and regular rhythm.     Heart sounds: Normal heart sounds. No murmur heard.   No gallop.  Pulmonary:     Effort: Pulmonary effort is normal. No respiratory distress.     Breath sounds: Normal breath sounds. No wheezing or rales.  Musculoskeletal:     Comments: Trigger finger in right hand Ganglion cyst in right hand  Skin:    General: Skin is warm and dry.  Neurological:     Mental Status: She is alert and oriented to person, place, and time.  Psychiatric:        Behavior: Behavior normal.        Judgment: Judgment normal.    BP 132/80 (BP Location: Left Arm, Patient Position: Sitting, Cuff Size: Normal)   Pulse 74   Temp 98.8 F (37.1 C) (Oral)  Resp 18   Ht $R'5\' 3"'DM$  (1.6 m)   Wt 174 lb 12.8 oz (79.3 kg)   SpO2 98%   BMI 30.96 kg/m  Wt Readings from Last 3 Encounters:  08/30/21 174 lb 12.8 oz (79.3 kg)  07/26/21 174 lb (78.9 kg)  07/12/21 174 lb (78.9 kg)    Diabetic Foot Exam - Simple   No data filed    Lab Results  Component Value Date   WBC 5.8 05/25/2021   HGB 11.6 (L) 05/25/2021   HCT 35.4 05/25/2021   PLT 260 05/25/2021   GLUCOSE 90 05/25/2021   CHOL 168 04/20/2021   TRIG 91 04/20/2021   HDL 47 (L) 04/20/2021   LDLDIRECT 126.3 06/06/2011   LDLCALC 102 (H) 04/20/2021   ALT 14 05/25/2021   AST 21  05/25/2021   NA 139 05/25/2021   K 3.9 05/25/2021   CL 104 05/25/2021   CREATININE 0.80 05/25/2021   BUN 16 05/25/2021   CO2 26 05/25/2021   TSH 1.26 05/25/2021   INR 1.0 05/25/2021   HGBA1C 6.5 (H) 04/20/2021   MICROALBUR 1.1 11/24/2020    Lab Results  Component Value Date   TSH 1.26 05/25/2021   Lab Results  Component Value Date   WBC 5.8 05/25/2021   HGB 11.6 (L) 05/25/2021   HCT 35.4 05/25/2021   MCV 84.5 05/25/2021   PLT 260 05/25/2021   Lab Results  Component Value Date   NA 139 05/25/2021   K 3.9 05/25/2021   CO2 26 05/25/2021   GLUCOSE 90 05/25/2021   BUN 16 05/25/2021   CREATININE 0.80 05/25/2021   BILITOT 0.5 05/25/2021   ALKPHOS 94 02/27/2021   AST 21 05/25/2021   ALT 14 05/25/2021   PROT 7.8 05/25/2021   ALBUMIN 3.9 02/27/2021   CALCIUM 9.9 05/25/2021   ANIONGAP 11 08/31/2014   GFR 87.63 02/27/2021   Lab Results  Component Value Date   CHOL 168 04/20/2021   Lab Results  Component Value Date   HDL 47 (L) 04/20/2021   Lab Results  Component Value Date   LDLCALC 102 (H) 04/20/2021   Lab Results  Component Value Date   TRIG 91 04/20/2021   Lab Results  Component Value Date   CHOLHDL 3.6 04/20/2021   Lab Results  Component Value Date   HGBA1C 6.5 (H) 04/20/2021       Assessment & Plan:   Problem List Items Addressed This Visit       Unprioritized   Essential hypertension    Well controlled, no changes to meds. Encouraged heart healthy diet such as the DASH diet and exercise as tolerated.       History of diabetes mellitus, type II    Check labs       Other Visit Diagnoses     Type 2 diabetes mellitus with hyperglycemia, without long-term current use of insulin ([place])    -  Primary   Relevant Orders   Lipid panel   Hemoglobin A1c   Comprehensive metabolic panel   Hyperlipidemia associated with type 2 diabetes mellitus (HCC)       Relevant Orders   Lipid panel   Hemoglobin A1c   Comprehensive metabolic panel   Trigger  finger of right thumb       Relevant Orders   Ambulatory referral to Orthopedic Surgery   Ganglion cyst of dorsum of right wrist       Relevant Orders   Ambulatory referral to Orthopedic Surgery   Need  for influenza vaccination       Relevant Orders   Flu Vaccine QUAD High Dose(Fluad) (Completed)        No orders of the defined types were placed in this encounter.   I, Dr. Roma Schanz, DO, personally preformed the services described in this documentation.  All medical record entries made by the scribe were at my direction and in my presence.  I have reviewed the chart and discharge instructions (if applicable) and agree that the record reflects my personal performance and is accurate and complete. 08/30/2021   I,[name],acting as a scribe for Ann Held, DO.,have documented all relevant documentation on the behalf of Ann Held, DO,as directed by  Ann Held, DO while in the presence of Ann Held, DO.   Ann Held, DO

## 2021-08-30 NOTE — Assessment & Plan Note (Signed)
Well controlled, no changes to meds. Encouraged heart healthy diet such as the DASH diet and exercise as tolerated.  °

## 2021-08-31 LAB — COMPREHENSIVE METABOLIC PANEL
ALT: 10 U/L (ref 0–35)
AST: 20 U/L (ref 0–37)
Albumin: 4.3 g/dL (ref 3.5–5.2)
Alkaline Phosphatase: 82 U/L (ref 39–117)
BUN: 17 mg/dL (ref 6–23)
CO2: 30 mEq/L (ref 19–32)
Calcium: 10 mg/dL (ref 8.4–10.5)
Chloride: 103 mEq/L (ref 96–112)
Creatinine, Ser: 0.8 mg/dL (ref 0.40–1.20)
GFR: 76.95 mL/min (ref >60.00)
Glucose, Bld: 94 mg/dL (ref 70–99)
Potassium: 4 mEq/L (ref 3.5–5.1)
Sodium: 138 mEq/L (ref 135–145)
Total Bilirubin: 0.5 mg/dL (ref 0.2–1.2)
Total Protein: 8.3 g/dL (ref 6.0–8.3)

## 2021-08-31 LAB — LIPID PANEL
Cholesterol: 169 mg/dL (ref 0–200)
HDL: 52 mg/dL (ref >39.00)
LDL Cholesterol: 100 mg/dL — ABNORMAL HIGH (ref 0–99)
NonHDL: 117.43
Total CHOL/HDL Ratio: 3
Triglycerides: 87 mg/dL (ref 0.0–149.0)
VLDL: 17.4 mg/dL (ref 0.0–40.0)

## 2021-08-31 LAB — HEMOGLOBIN A1C: Hgb A1c MFr Bld: 7.1 % — ABNORMAL HIGH (ref 4.6–6.5)

## 2021-09-05 ENCOUNTER — Other Ambulatory Visit: Payer: Self-pay | Admitting: Family Medicine

## 2021-09-05 ENCOUNTER — Other Ambulatory Visit: Payer: Self-pay

## 2021-09-05 DIAGNOSIS — E1165 Type 2 diabetes mellitus with hyperglycemia: Secondary | ICD-10-CM

## 2021-09-05 DIAGNOSIS — E1169 Type 2 diabetes mellitus with other specified complication: Secondary | ICD-10-CM

## 2021-09-05 DIAGNOSIS — E785 Hyperlipidemia, unspecified: Secondary | ICD-10-CM

## 2021-09-05 MED ORDER — METFORMIN HCL 500 MG PO TABS
500.0000 mg | ORAL_TABLET | Freq: Two times a day (BID) | ORAL | 2 refills | Status: DC
Start: 2021-09-05 — End: 2021-09-27

## 2021-09-06 ENCOUNTER — Telehealth: Payer: Self-pay | Admitting: Family Medicine

## 2021-09-06 NOTE — Telephone Encounter (Signed)
Attempted to schedule AWV. Unable to LVM.  Will try at later time. Patient mailbox is full  

## 2021-09-13 ENCOUNTER — Encounter: Payer: Self-pay | Admitting: Orthopedic Surgery

## 2021-09-13 ENCOUNTER — Ambulatory Visit (INDEPENDENT_AMBULATORY_CARE_PROVIDER_SITE_OTHER): Payer: Medicare Other | Admitting: Orthopedic Surgery

## 2021-09-13 ENCOUNTER — Other Ambulatory Visit: Payer: Self-pay

## 2021-09-13 DIAGNOSIS — M65311 Trigger thumb, right thumb: Secondary | ICD-10-CM | POA: Diagnosis not present

## 2021-09-13 MED ORDER — LIDOCAINE HCL 1 % IJ SOLN
1.0000 mL | INTRAMUSCULAR | Status: AC | PRN
  Administered 2021-09-13: 14:00:00 1 mL

## 2021-09-13 MED ORDER — BETAMETHASONE SOD PHOS & ACET 6 (3-3) MG/ML IJ SUSP
6.0000 mg | INTRAMUSCULAR | Status: AC | PRN
  Administered 2021-09-13: 14:00:00 6 mg via INTRA_ARTICULAR

## 2021-09-13 NOTE — Progress Notes (Signed)
Office Visit Note   Patient: Debra [name]           Date of Birth: 06-Jan-1956           MRN: [id number] Visit Date: 09/13/2021              Requested by: [address], [place], [place] [address] [place],  [place] [address] PCP: Carollee Herter, Alferd Apa, DO   Assessment & Plan: Visit Diagnoses:  1. Trigger thumb, right thumb     Plan: We discussed the diagnosis, prognosis, non-operative and operative treatment options for trigger thumb.  After our discussion, the patient would like to proceed with corticosteroid injection.  We reviewed the risks and benefits of conservative management.  We specifically discussed close monitoring of her blood sugar for the next week or so. The patient expressed understanding of the reasoning and strategy going forward.  All patient questions and concerns were addressed.    Follow-Up Instructions: No follow-ups on file.   Orders:  No orders of the defined types were placed in this encounter.  No orders of the defined types were placed in this encounter.     Procedures: Hand/UE Inj: R thumb A1 for trigger finger on 09/13/2021 1:32 PM Indications: pain Details: 25 G needle, volar approach Medications: 1 mL lidocaine 1 %; 6 mg betamethasone acetate-betamethasone sodium phosphate 6 (3-3) MG/ML Outcome: tolerated well, no immediate complications Procedure, treatment alternatives, risks and benefits explained, specific risks discussed. Consent was given by the patient. Patient was prepped and draped in the usual sterile fashion.      Clinical Data: No additional findings.   Subjective: Chief Complaint  Patient presents with   Right Hand - New Patient (Initial Visit)    This is a 65 yo RHD F who presents with triggering of her right thumb for approximately 3 months.  It is painful.  It has progressively worsened.  She denies triggering of any other digits.  She hasn't tried any treatment for her triggering.    Review of  Systems   Objective: Vital Signs: BP 140/85 (BP Location: Left Arm, Patient Position: Sitting, Cuff Size: Normal)   Pulse 72   Ht 5\' 3"  (1.6 m)   Wt 174 lb (78.9 kg)   SpO2 97%   BMI 30.82 kg/m   Physical Exam Constitutional:      Appearance: Normal appearance.  Cardiovascular:     Rate and Rhythm: Normal rate.     Pulses: Normal pulses.  Pulmonary:     Effort: Pulmonary effort is normal.  Skin:    General: Skin is warm and dry.     Capillary Refill: Capillary refill takes less than 2 seconds.  Neurological:     Mental Status: She is alert.    Right Hand Exam   Tenderness  Right hand tenderness location: TTP at thumb A1 pulley with palpable nodule.  Other  Erythema: absent Sensation: normal Pulse: present  Comments:  Visible and palpable triggering of right thumb.      Specialty Comments:  No specialty comments available.  Imaging: No results found.   PMFS History: Patient Active Problem List   Diagnosis Date Noted   Trigger thumb, right thumb 09/13/2021   Pruritic dermatitis 05/25/2021   Vaginal discharge 05/25/2021   Balance disorder 05/25/2021   Dizziness 05/25/2021   Osteoarthritis    Vitamin D deficiency    Urinary frequency 10/31/2020   Chronic pain of both shoulders 10/31/2020   Female  pattern baldness 10/06/2020   Myalgia 10/06/2020   Urge incontinence of urine 10/06/2020   History of diabetes mellitus, type II 01/11/2020   Allergies 02/03/2019   Acute vaginitis 04/22/2018   Morbid obesity 04/22/2018   Generalized anxiety disorder 04/22/2018   Obstructive sleep apnea 01/30/2018   Major depressive disorder 01/30/2018   Pansinusitis 01/30/2018   Aortic atherosclerosis 10/10/2017   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   Hyperlipidemia 04/17/2017   Anemia, iron deficiency 04/17/2017   Atypical chest pain 08/31/2014   Arm weakness 08/31/2014   Hearing loss 07/24/2012   Hypothyroidism 11/02/2010   Essential hypertension 08/31/2010    Hyperglycemia, fasting 40/98/1191   Diastolic dysfunction 47/82/9562   Back pain with radiculopathy 01/25/2010   Fatigue 01/25/2010   Tobacco abuse 12/01/2008   Restless legs syndrome 12/01/2008   Leg pain, bilateral 11/17/2008   Palpitations 02/12/2008   GERD (gastroesophageal reflux disease) 07/16/2007   Hiatal hernia 07/16/2007   Knee pain 07/16/2007   Low back pain 07/16/2007   Peripheral vertigo 04/30/2007   Insomnia 04/30/2007   Abdominal pain, right upper quadrant 04/30/2007   Past Medical History:  Diagnosis Date   Abdominal pain, right upper quadrant 04/30/2007   Allergies 02/03/2019   Allergy    Anemia, iron deficiency 04/17/2017   Aortic atherosclerosis 10/10/2017   Atypical chest pain 08/31/2014   Back pain with radiculopathy 01/25/2010   Check xray --- ? Cause of leg weakness   Chronic pain of both shoulders 10/31/2020   Constipation    DDD (degenerative disc disease)    Diabetes mellitus type II, uncontrolled 13/05/6577   Diastolic dysfunction 46/96/2952   Epilepsy    Childhood seziures; pt reports that she grew out of them and they have not occurred since childhood   Essential hypertension 08/31/2010   Poorly controlled will alter medications, encouraged DASH diet, minimize caffeine and obtain adequate sleep. Report concerning symptoms and follow up as directed and as needed [employer] metoprolol Edema may be c   Fatigue 01/25/2010   Generalized anxiety disorder 04/22/2018   Stable co'nt meds   GERD (gastroesophageal reflux disease) 07/16/2007   Hearing loss 07/24/2012   Refer to hearing clinic   Hiatal hernia 07/16/2007   Hyperglycemia, fasting 05/04/2010   Hyperlipidemia 04/17/2017   Encouraged heart healthy diet, increase exercise, avoid trans fats, consider a krill oil cap daily   Hypothyroidism 11/02/2010   con't meds; Lab Results Component Value TSH 1.44 08/12/2017   Insomnia 04/30/2007   Knee pain 07/16/2007   Leg pain, bilateral 11/17/2008    Low back pain 07/16/2007   Major depressive disorder 01/30/2018   con't meds F/u counselor   Mixed connective tissue disease    with Raynaud's   Obstructive sleep apnea 01/30/2018   F/u with pulm; May be cause of some of her memory/concentration problems   Osteoarthritis    Osteoporosis    Palpitations 02/12/2008   Pansinusitis 01/30/2018   Peripheral vertigo 04/30/2007   Resolved rto prn   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   Restless legs syndrome 12/01/2008   Stomach ulcer    Swallowing difficulty    Upper respiratory tract infection 12/14/2019   Urge incontinence of urine 10/06/2020   Vitamin D deficiency     Family History  Problem Relation Age of Onset   Breast cancer Mother        possibly 34's   Arthritis Mother        rheumatoid   Cancer Mother        breast  Heart disease Mother 66       MI   Hyperlipidemia Mother    Thyroid disease Mother    Depression Mother    Anxiety disorder Mother    Heart disease Father        MI   Hypertension Father    Stroke Father    Diabetes Sister    Hypertension Sister    Hyperlipidemia Sister    Memory loss Maternal Aunt    Colon cancer Maternal Grandfather    Esophageal cancer Neg Hx    Rectal cancer Neg Hx    Stomach cancer Neg Hx     Past Surgical History:  Procedure Laterality Date   CARPAL TUNNEL RELEASE     Bilateral   CERVICAL SPINE SURGERY     x3   LEG SURGERY     Right    WRIST FRACTURE SURGERY  10-02-15   Pt have surgery twice on the same wrist.   Social History   Occupational History   Occupation: Security  Tobacco Use   Smoking status: Every Day    Packs/day: 0.75    Years: 44.00    Pack years: 33.00    Types: Cigarettes   Smokeless tobacco: Never  Substance and Sexual Activity   Alcohol use: Yes    Comment: 2-3 drinks per week (beer/wine)   Drug use: No   Sexual activity: Yes    Partners: Male    Birth control/protection: Post-menopausal

## 2021-09-15 ENCOUNTER — Other Ambulatory Visit: Payer: Self-pay | Admitting: Family Medicine

## 2021-09-15 DIAGNOSIS — E785 Hyperlipidemia, unspecified: Secondary | ICD-10-CM

## 2021-09-15 DIAGNOSIS — E1169 Type 2 diabetes mellitus with other specified complication: Secondary | ICD-10-CM

## 2021-09-15 DIAGNOSIS — B9689 Other specified bacterial agents as the cause of diseases classified elsewhere: Secondary | ICD-10-CM

## 2021-09-17 ENCOUNTER — Other Ambulatory Visit: Payer: Self-pay

## 2021-09-17 MED ORDER — PRAVASTATIN SODIUM 40 MG PO TABS: 40.0000 mg | ORAL_TABLET | Freq: Every day | ORAL | 0 refills | Status: DC

## 2021-09-27 ENCOUNTER — Encounter: Payer: Self-pay | Admitting: Family Medicine

## 2021-09-27 ENCOUNTER — Ambulatory Visit (INDEPENDENT_AMBULATORY_CARE_PROVIDER_SITE_OTHER): Payer: Medicare Other | Admitting: Family Medicine

## 2021-09-27 VITALS — BP 141/79 | HR 79 | Temp 98.4°F | Resp 16 | Ht 63.0 in | Wt 174.6 lb

## 2021-09-27 DIAGNOSIS — S91001A Unspecified open wound, right ankle, initial encounter: Secondary | ICD-10-CM

## 2021-09-27 DIAGNOSIS — Z23 Encounter for immunization: Secondary | ICD-10-CM

## 2021-09-27 DIAGNOSIS — E1165 Type 2 diabetes mellitus with hyperglycemia: Secondary | ICD-10-CM

## 2021-09-27 HISTORY — DX: Unspecified open wound, right ankle, initial encounter: S91.001A

## 2021-09-27 MED ORDER — DOXYCYCLINE HYCLATE 100 MG PO TABS: 100.0000 mg | ORAL_TABLET | Freq: Two times a day (BID) | ORAL | 0 refills | Status: DC

## 2021-09-27 MED ORDER — METFORMIN HCL 500 MG PO TABS: 500.0000 mg | ORAL_TABLET | Freq: Two times a day (BID) | ORAL | 2 refills | Status: DC

## 2021-09-27 NOTE — Assessment & Plan Note (Signed)
abx per orders  Td given F/u 1 week or sooner prn

## 2021-09-27 NOTE — Patient Instructions (Signed)
Wound Care, Adult ?Taking care of your wound properly can help to prevent pain, infection, and scarring. It can also help your wound heal more quickly. Follow instructions from your health care provider about how to care for your wound. ?Supplies needed: ?Soap and water. ?Wound cleanser, saline, or germ-free (sterile) water. ?Gauze. ?If needed, a clean bandage (dressing) or other type of wound dressing material to cover or place in the wound. Follow your health care provider's instructions about what dressing supplies to use. ?Cream or topical ointment to apply to the wound, if told by your health care provider. ?How to care for your wound ?Cleaning the wound ?Ask your health care provider how to clean the wound. This may include: ?Using mild soap and water, a wound cleanser, saline, or sterile water. ?Using a clean gauze to pat the wound dry after cleaning it. Do not rub or scrub the wound. ?Dressing care ?Wash your hands with soap and water for at least 20 seconds before and after you change the dressing. If soap and water are not available, use hand sanitizer. ?Change your dressing as told by your health care provider. This may include: ?Cleaning or rinsing out (irrigating) the wound. ?Application of cream or topical ointment, if told by your health care provider. ?Placing a dressing over the wound or in the wound (packing). ?Covering the wound with an outer dressing. ?Leave stitches (sutures), staples, skin glue, or adhesive strips in place. These skin closures may need to stay in place for 2 weeks or longer. If adhesive strip edges start to loosen and curl up, you may trim the loose edges. Do not remove adhesive strips completely unless your health care provider tells you to do that. ?Ask your health care provider when you can leave the wound uncovered. ?Checking for infection ?Check your wound area every day for signs of infection. Check for: ?More redness, swelling, or pain. ?Fluid or blood. ?Warmth. ?Pus or  a bad smell. ? ?Follow these instructions at home ?Medicines ?If you were prescribed an antibiotic medicine, cream, or ointment, take or apply it as told by your health care provider. Do not stop using the antibiotic even if your condition improves. ?If you were prescribed pain medicine, take it 30 minutes before you do any wound care or as told by your health care provider. ?Take over-the-counter and prescription medicines only as told by your health care provider. ?Eating and drinking ?Eat a diet that includes protein, vitamin A, vitamin C, and other nutrient-rich foods to help the wound heal. ?Foods rich in protein include meat, fish, eggs, dairy, beans, and nuts. ?Foods rich in vitamin A include carrots and dark green, leafy vegetables. ?Foods rich in vitamin C include citrus fruits, tomatoes, broccoli, and peppers. ?Drink enough fluid to keep your urine pale yellow. ?General instructions ?Do not take baths, swim, or use a hot tub until your health care provider approves. Ask your health care provider if you may take showers. You may only be allowed to take sponge baths. ?Do not scratch or pick at the wound. Keep it covered as told by your health care provider. ?Return to your normal activities as told by your health care provider. Ask your health care provider what activities are safe for you. ?Protect your wound from the sun when you are outside for the first 6 months, or for as long as told by your health care provider. Cover up the scar area or apply sunscreen that has an SPF of at least 30. ?Do not   use any products that contain nicotine or tobacco. These products include cigarettes, chewing tobacco, and vaping devices, such as e-cigarettes. If you need help quitting, ask your health care provider. ?Keep all follow-up visits. This is important. ?Contact a health care provider if: ?You received a tetanus shot and you have swelling, severe pain, redness, or bleeding at the injection site. ?Your pain is not  controlled with medicine. ?You have any of these signs of infection: ?More redness, swelling, or pain around the wound. ?Fluid or blood coming from the wound. ?Warmth coming from the wound. ?A fever or chills. ?You are nauseous or you vomit. ?You are dizzy. ?You have a new rash or hardness around the wound. ?Get help right away if: ?You have a red streak of skin near the area around your wound. ?Pus or a bad smell coming from the wound. ?Your wound has been closed with staples, sutures, skin glue, or adhesive strips and it begins to open up and separate. ?Your wound is bleeding, and the bleeding does not stop with gentle pressure. ?These symptoms may represent a serious problem that is an emergency. Do not wait to see if the symptoms will go away. Get medical help right away. Call your local emergency services (911 in the [place] Do not drive yourself to the hospital. ?Summary ?Always wash your hands with soap and water for at least 20 seconds before and after changing your dressing. ?Change your dressing as told by your health care provider. ?To help with healing, eat foods that are rich in protein, vitamin A, vitamin C, and other nutrients. ?Check your wound every day for signs of infection. Contact your health care provider if you think that your wound is infected. ?This information is not intended to replace advice given to you by your health care provider. Make sure you discuss any questions you have with your health care provider. ?Document Revised: 02/20/2021 Document Reviewed: 02/20/2021 ?[employer]vier Patient Education ? 2022 [employer] ? ?

## 2021-09-27 NOTE — Progress Notes (Signed)
Established Patient Office Visit  Subjective:  Patient ID: Debra [name], female    DOB: 08-08-1956  Age: 65 y.o. MRN: [id number]  CC:  Chief Complaint  Patient presents with   Wound Infection    HPI Debra [name] presents for R foot pain since sat ----  a wheelchair hit her foot.    She was in [place].  She did not see a doctor there.    She states it took a while to get the bleeding to stop.   No other complaints   Past Medical History:  Diagnosis Date   Abdominal pain, right upper quadrant 04/30/2007   Allergies 02/03/2019   Allergy    Anemia, iron deficiency 04/17/2017   Aortic atherosclerosis 10/10/2017   Atypical chest pain 08/31/2014   Back pain with radiculopathy 01/25/2010   Check xray --- ? Cause of leg weakness   Chronic pain of both shoulders 10/31/2020   Constipation    DDD (degenerative disc disease)    Diabetes mellitus type II, uncontrolled 83/41/9622   Diastolic dysfunction 29/79/8921   Epilepsy    Childhood seziures; pt reports that she grew out of them and they have not occurred since childhood   Essential hypertension 08/31/2010   Poorly controlled will alter medications, encouraged DASH diet, minimize caffeine and obtain adequate sleep. Report concerning symptoms and follow up as directed and as needed [employer] metoprolol Edema may be c   Fatigue 01/25/2010   Generalized anxiety disorder 04/22/2018   Stable co'nt meds   GERD (gastroesophageal reflux disease) 07/16/2007   Hearing loss 07/24/2012   Refer to hearing clinic   Hiatal hernia 07/16/2007   Hyperglycemia, fasting 05/04/2010   Hyperlipidemia 04/17/2017   Encouraged heart healthy diet, increase exercise, avoid trans fats, consider a krill oil cap daily   Hypothyroidism 11/02/2010   con't meds; Lab Results Component Value TSH 1.44 08/12/2017   Insomnia 04/30/2007   Knee pain 07/16/2007   Leg pain, bilateral 11/17/2008   Low back pain 07/16/2007   Major depressive disorder  01/30/2018   con't meds F/u counselor   Mixed connective tissue disease    with Raynaud's   Obstructive sleep apnea 01/30/2018   F/u with pulm; May be cause of some of her memory/concentration problems   Osteoarthritis    Osteoporosis    Palpitations 02/12/2008   Pansinusitis 01/30/2018   Peripheral vertigo 04/30/2007   Resolved rto prn   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    Family History  Problem Relation Age of Onset   Breast cancer Mother        possibly 20's   Arthritis Mother        rheumatoid   Cancer Mother        breast   Heart disease Mother 84       MI   Hyperlipidemia Mother    Thyroid disease Mother    Depression Mother    Anxiety disorder Mother  Heart disease Father        MI   Hypertension Father    Stroke Father    Diabetes Sister    Hypertension Sister    Hyperlipidemia Sister    Memory loss Maternal Aunt    Colon cancer Maternal Grandfather    Esophageal cancer Neg Hx    Rectal cancer Neg Hx    Stomach cancer Neg Hx     Social History   Socioeconomic History   Marital status: Legally Separated    Spouse name: Not on file   Number of children: 3   Years of education: 14   Highest education level: Some college, no degree  Occupational History   Occupation: Security  Tobacco Use   Smoking status: Every Day    Packs/day: 0.75    Years: 44.00    Pack years: 33.00    Types: Cigarettes   Smokeless tobacco: Never  Substance and Sexual Activity   Alcohol use: Yes    Comment: 2-3 drinks per week (beer/wine)   Drug use: No    Sexual activity: Yes    Partners: Male    Birth control/protection: Post-menopausal  Other Topics Concern   Not on file  Social History Narrative   Not on file   Social Determinants of Health   Financial Resource Strain: Not on file  Food Insecurity: Not on file  Transportation Needs: Not on file  Physical Activity: Not on file  Stress: Not on file  Social Connections: Not on file  Intimate Partner Violence: Not on file    Outpatient Medications Prior to Visit  Medication Sig Dispense Refill   azelastine (ASTELIN) 0.1 % nasal spray Place 2 sprays into both nostrils 2 (two) times daily. Use in each nostril as directed 30 mL 5   blood glucose meter kit and supplies KIT Dispense based on patient and insurance preference. Use up to four times daily as directed. (FOR ICD-9 250.00, 250.01). 1 each 0   ezetimibe (ZETIA) 10 MG tablet Take 1 tablet (10 mg total) by mouth at bedtime. 30 tablet 2   gabapentin (NEURONTIN) 600 MG tablet TAKE 1 TABLET BY MOUTH 3  TIMES DAILY 270 tablet 3   hydrochlorothiazide (HYDRODIURIL) 25 MG tablet TAKE 1 TABLET BY MOUTH  DAILY 90 tablet 1   levothyroxine (SYNTHROID) 50 MCG tablet Take 1 tablet (50 mcg total) by mouth daily. 90 tablet 3   losartan (COZAAR) 100 MG tablet TAKE 1 TABLET BY MOUTH ONCE DAILY 90 tablet 1   methocarbamol (ROBAXIN) 500 MG tablet Take 1 tablet (500 mg total) by mouth in the morning and at bedtime. 60 tablet 3   metoprolol succinate (TOPROL-XL) 100 MG 24 hr tablet Take 1 tablet (100 mg total) by mouth daily. Take with or immediately following a meal. 90 tablet 3   montelukast (SINGULAIR) 10 MG tablet Take 1 tablet (10 mg total) by mouth at bedtime. 30 tablet 11   NON [place]  Scar Cream -  Verapamil 10%, Pentoxifylline 5% Apply 1-2 grams to affected area 3-4 times daily Qty. 120 gm 3 refills     PARoxetine (PAXIL) 30 MG tablet Take 1 tablet (30 mg total) by mouth daily. 90 tablet 1   pravastatin (PRAVACHOL)  40 MG tablet Take 1 tablet (40 mg total) by mouth daily. 90 tablet 0   solifenacin (VESICARE) 5 MG tablet Take 1 tablet (5 mg total) by mouth daily. 30 tablet 2   metFORMIN (GLUCOPHAGE) 500 MG tablet Take 1  tablet (500 mg total) by mouth 2 (two) times daily with a meal. 60 tablet 2   aspirin EC 81 MG tablet Take 1 tablet (81 mg total) by mouth daily. Swallow whole. (Patient not taking: Reported on 07/12/2021) 90 tablet 3   Vitamin D, Ergocalciferol, (DRISDOL) 1.25 MG (50000 UT) CAPS capsule Take 1 capsule (50,000 Units total) by mouth every 7 (seven) days. (Patient not taking: Reported on 09/27/2021) 4 capsule 0   COVID-19 mRNA Vac-TriS, Pfizer, (PFIZER-BIONT COVID-19 VAC-TRIS) SUSP injection Inject into the muscle. 0.3 mL 0   predniSONE (DELTASONE) 20 MG tablet Take 2 tablets (40 mg total) by mouth daily. (Patient not taking: Reported on 07/12/2021) 10 tablet 0   Facility-Administered Medications Prior to Visit  Medication Dose Route Frequency Provider Last Rate Last Admin   0.9 %  sodium chloride infusion  500 mL Intravenous Once Ladene Artist, MD        Allergies  Allergen Reactions   Penicillins    Codeine Rash and Other (See Comments)    dizziness    ROS Review of Systems  Constitutional:  Negative for appetite change, diaphoresis, fatigue and unexpected weight change.  Eyes:  Negative for pain, redness and visual disturbance.  Respiratory:  Negative for cough, chest tightness, shortness of breath and wheezing.   Cardiovascular:  Negative for chest pain, palpitations and leg swelling.  Endocrine: Negative for cold intolerance, heat intolerance, polydipsia, polyphagia and polyuria.  Genitourinary:  Negative for difficulty urinating, dysuria and frequency.  Skin:  Positive for wound.  Neurological:  Negative for dizziness, light-headedness, numbness and headaches.     Objective:    Physical Exam Vitals and nursing note reviewed.  Constitutional:      Appearance: She is  well-developed.  HENT:     Head: Normocephalic and atraumatic.  Eyes:     Conjunctiva/sclera: Conjunctivae normal.  Neck:     Thyroid: No thyromegaly.     Vascular: No carotid bruit or JVD.  Cardiovascular:     Rate and Rhythm: Normal rate and regular rhythm.     Heart sounds: Normal heart sounds. No murmur heard. Pulmonary:     Effort: Pulmonary effort is normal. No respiratory distress.     Breath sounds: Normal breath sounds. No wheezing or rales.  Chest:     Chest wall: No tenderness.  Musculoskeletal:     Cervical back: Normal range of motion and neck supple.  Skin:         Comments: Laceration R ankle --- no bleeding  Surrounding errythema  No d/c  Foot soaked and wound cleaned well  Abx ointment and bandage placed   Neurological:     Mental Status: She is alert and oriented to person, place, and time.    BP (!) 141/79   Pulse 79   Temp 98.4 F (36.9 C)   Resp 16   Ht $R'5\' 3"'vh$  (1.6 m)   Wt 174 lb 9.6 oz (79.2 kg)   SpO2 100%   BMI 30.93 kg/m  Wt Readings from Last 3 Encounters:  09/27/21 174 lb 9.6 oz (79.2 kg)  09/13/21 174 lb (78.9 kg)  08/30/21 174 lb 12.8 oz (79.3 kg)     Health Maintenance Due  Topic Date Due   OPHTHALMOLOGY EXAM  05/14/2019    There are no preventive care reminders to display for this patient.  Lab Results  Component Value Date   TSH 1.26 05/25/2021   Lab Results  Component Value Date   WBC 5.8  05/25/2021   HGB 11.6 (L) 05/25/2021   HCT 35.4 05/25/2021   MCV 84.5 05/25/2021   PLT 260 05/25/2021   Lab Results  Component Value Date   NA 138 08/30/2021   K 4.0 08/30/2021   CO2 30 08/30/2021   GLUCOSE 94 08/30/2021   BUN 17 08/30/2021   CREATININE 0.80 08/30/2021   BILITOT 0.5 08/30/2021   ALKPHOS 82 08/30/2021   AST 20 08/30/2021   ALT 10 08/30/2021   PROT 8.3 08/30/2021   ALBUMIN 4.3 08/30/2021   CALCIUM 10.0 08/30/2021   ANIONGAP 11 08/31/2014   GFR 76.95 08/30/2021   Lab Results  Component Value Date    CHOL 169 08/30/2021   Lab Results  Component Value Date   HDL 52.00 08/30/2021   Lab Results  Component Value Date   LDLCALC 100 (H) 08/30/2021   Lab Results  Component Value Date   TRIG 87.0 08/30/2021   Lab Results  Component Value Date   CHOLHDL 3 08/30/2021   Lab Results  Component Value Date   HGBA1C 7.1 (H) 08/30/2021      Assessment & Plan:   Problem List Items Addressed This Visit       Unprioritized   Wound of right ankle - Primary    abx per orders  Td given F/u 1 week or sooner prn       Relevant Medications   doxycycline (VIBRA-TABS) 100 MG tablet   Other Relevant Orders   Td vaccine greater than or equal to 7yo preservative free IM (Completed)   Other Visit Diagnoses     Type 2 diabetes mellitus with hyperglycemia, without long-term current use of insulin (HCC)       Relevant Medications   metFORMIN (GLUCOPHAGE) 500 MG tablet   Need for Td vaccine           Meds ordered this encounter  Medications   doxycycline (VIBRA-TABS) 100 MG tablet    Sig: Take 1 tablet (100 mg total) by mouth 2 (two) times daily.    Dispense:  20 tablet    Refill:  0   metFORMIN (GLUCOPHAGE) 500 MG tablet    Sig: Take 1 tablet (500 mg total) by mouth 2 (two) times daily with a meal.    Dispense:  60 tablet    Refill:  2    Follow-up: Return in about 1 week (around 10/04/2021), or if symptoms worsen or fail to improve, for f/u wound.    Ann Held, DO

## 2021-10-04 ENCOUNTER — Encounter: Payer: Self-pay | Admitting: Family Medicine

## 2021-10-04 ENCOUNTER — Ambulatory Visit (INDEPENDENT_AMBULATORY_CARE_PROVIDER_SITE_OTHER): Payer: Medicare Other | Admitting: Family Medicine

## 2021-10-04 VITALS — BP 118/88 | HR 69 | Temp 98.1°F | Resp 18 | Ht 63.0 in | Wt 172.4 lb

## 2021-10-04 DIAGNOSIS — S91001A Unspecified open wound, right ankle, initial encounter: Secondary | ICD-10-CM | POA: Diagnosis not present

## 2021-10-04 DIAGNOSIS — L089 Local infection of the skin and subcutaneous tissue, unspecified: Secondary | ICD-10-CM

## 2021-10-04 DIAGNOSIS — T148XXA Other injury of unspecified body region, initial encounter: Secondary | ICD-10-CM | POA: Diagnosis not present

## 2021-10-04 HISTORY — DX: Local infection of the skin and subcutaneous tissue, unspecified: L08.9

## 2021-10-04 MED ORDER — SULFAMETHOXAZOLE-TRIMETHOPRIM 800-160 MG PO TABS
1.0000 | ORAL_TABLET | Freq: Two times a day (BID) | ORAL | 0 refills | Status: DC
Start: 2021-10-04 — End: 2021-12-28

## 2021-10-04 NOTE — Patient Instructions (Signed)
Wound Infection A wound infection happens when tiny organisms (microorganisms) start to grow in a wound. A wound infection is most often caused by bacteria. Infection can cause the wound to break open or worsen. Wound infection needs treatment. If a wound infection is left untreated, complications can occur. Untreated wound infections may lead to an infection in the bloodstream (septicemia) or a bone infection (osteomyelitis). What are the causes? This condition is most often caused by bacteria growing in a wound. Other microorganisms, like yeast and fungi, can also cause wound infections. What increases the risk? The following factors may make you more likely to develop this condition: Having a weak body defense system (immune system). Having diabetes. Taking steroid medicines for a long time (chronic use). Smoking. Being an older person. Being overweight. Taking chemotherapy medicines. What are the signs or symptoms? Symptoms of this condition include: Having more redness, swelling, or pain at the wound site. Having more blood or fluid at the wound site. A bad smell coming from a wound or bandage (dressing). Having a fever. Feeling tired or fatigued. Having warmth at or around the wound. Having pus at the wound site. How is this diagnosed? This condition is diagnosed with a medical history and physical exam. You may also have a wound culture or blood tests or both. How is this treated? This condition is usually treated with an antibiotic medicine. The infection should improve 24-48 hours after you start antibiotics. After 24-48 hours, redness around the wound should stop spreading, and the wound should be less painful. Follow these instructions at home: Medicines Take or apply over-the-counter and prescription medicines only as told by your health care provider. If you were prescribed an antibiotic medicine, take or apply it as told by your health care provider. Do not stop using the  antibiotic even if you start to feel better. Wound care  Clean the wound each day, or as told by your health care provider. Wash the wound with mild soap and water. Rinse the wound with water to remove all soap. Pat the wound dry with a clean towel. Do not rub it. Follow instructions from your health care provider about how to take care of your wound. Make sure you: Wash your hands with soap and water before and after you change your dressing. If soap and water are not available, use hand sanitizer. Change your dressing as told by your health care provider. Leave stitches (sutures), skin glue, or adhesive strips in place if your wound has been closed. These skin closures may need to stay in place for 2 weeks or longer. If adhesive strip edges start to loosen and curl up, you may trim the loose edges. Do not remove adhesive strips completely unless your health care provider tells you to do that. Some wounds are left open to heal on their own. Check your wound every day for signs of infection. Watch for: More redness, swelling, or pain. More fluid or blood. Warmth. Pus or a bad smell. General instructions Keep the dressing dry until your health care provider says it can be removed. Do not take baths, swim, or use a hot tub until your health care provider approves. Ask your health care provider if you may take showers. You may only be allowed to take sponge baths. Raise (elevate) the injured area above the level of your heart while you are sitting or lying down. Do not scratch or pick at the wound. Keep all follow-up visits as told by your health care  provider. This is important. Contact a health care provider if: Your pain is not controlled with medicine. You have more redness, swelling, or pain around your wound. You have more fluid or blood coming from your wound. Your wound feels warm to the touch. You have pus coming from your wound. You continue to notice a bad smell coming from your  wound or your dressing. Your wound that was closed breaks open. Get help right away if: You have a red streak going away from your wound. You have a fever. Summary A wound infection happens when tiny organisms (microorganisms) start to grow in a wound. This condition is usually treated with an antibiotic medicine. Follow instructions from your health care provider about how to take care of your wound. Contact a health care provider if your wound infection does not begin to improve in 24-48 hours, or your symptoms worsen. Keep all follow-up visits as told by your health care provider. This is important. This information is not intended to replace advice given to you by your health care provider. Make sure you discuss any questions you have with your health care provider. Document Revised: 05/26/2018 Document Reviewed: 05/26/2018 Elsevier Patient Education  [place].

## 2021-10-04 NOTE — Progress Notes (Addendum)
Subjective:   By signing my name below, I, [name], attest that this documentation has been prepared under the direction and in the presence of Dr. Roma Schanz, DO. 10/04/2021    Patient ID: Debra [name], female    DOB: 1955-12-27, 65 y.o.   MRN: [id number]  Chief Complaint  Patient presents with   Wound Check    Wound Check  Patient is in today for a office visit.  During last visit she complained of a wound on her right ankle. Her wound has not healed at this time and is still open. She is covering it with gauze wraps at this time. She finished her course of 100 mg doxycycline 2x daily PO and reports no new issues while taking it. She reports the wound is bothering her at work. She feels her shoe rubbing against the wound even while wearing gauze around it. She concerned because she feels it should be better by now    Past Medical History:  Diagnosis Date   Abdominal pain, right upper quadrant 04/30/2007   Allergies 02/03/2019   Allergy    Anemia, iron deficiency 04/17/2017   Aortic atherosclerosis 10/10/2017   Atypical chest pain 08/31/2014   Back pain with radiculopathy 01/25/2010   Check xray --- ? Cause of leg weakness   Chronic pain of both shoulders 10/31/2020   Constipation    DDD (degenerative disc disease)    Diabetes mellitus type II, uncontrolled 24/23/5361   Diastolic dysfunction 44/31/5400   Epilepsy    Childhood seziures; pt reports that she grew out of them and they have not occurred since childhood   Essential hypertension 08/31/2010   Poorly controlled will alter medications, encouraged DASH diet, minimize caffeine and obtain adequate sleep. Report concerning symptoms and follow up as directed and as needed [employer] metoprolol Edema may be c   Fatigue 01/25/2010   Generalized anxiety disorder 04/22/2018   Stable co'nt meds   GERD (gastroesophageal reflux disease) 07/16/2007   Hearing loss 07/24/2012   Refer to hearing clinic    Hiatal hernia 07/16/2007   Hyperglycemia, fasting 05/04/2010   Hyperlipidemia 04/17/2017   Encouraged heart healthy diet, increase exercise, avoid trans fats, consider a krill oil cap daily   Hypothyroidism 11/02/2010   con't meds; Lab Results Component Value TSH 1.44 08/12/2017   Insomnia 04/30/2007   Knee pain 07/16/2007   Leg pain, bilateral 11/17/2008   Low back pain 07/16/2007   Major depressive disorder 01/30/2018   con't meds F/u counselor   Mixed connective tissue disease    with Raynaud's   Obstructive sleep apnea 01/30/2018   F/u with pulm; May be cause of some of her memory/concentration problems   Osteoarthritis    Osteoporosis    Palpitations 02/12/2008   Pansinusitis 01/30/2018   Peripheral vertigo 04/30/2007   Resolved rto prn   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    Family History  Problem Relation Age of Onset  Breast cancer Mother        possibly 16's   Arthritis Mother        rheumatoid   Cancer Mother        breast   Heart disease Mother 87       MI   Hyperlipidemia Mother    Thyroid disease Mother    Depression Mother    Anxiety disorder Mother    Heart disease Father        MI   Hypertension Father    Stroke Father    Diabetes Sister    Hypertension Sister    Hyperlipidemia Sister    Memory loss Maternal Aunt    Colon cancer Maternal Grandfather    Esophageal cancer Neg Hx    Rectal cancer Neg Hx    Stomach cancer Neg Hx     Social History   Socioeconomic History   Marital status: Legally Separated    Spouse name: Not  on file   Number of children: 3   Years of education: 14   Highest education level: Some college, no degree  Occupational History   Occupation: Security  Tobacco Use   Smoking status: Every Day    Packs/day: 0.75    Years: 44.00    Pack years: 33.00    Types: Cigarettes   Smokeless tobacco: Never  Substance and Sexual Activity   Alcohol use: Yes    Comment: 2-3 drinks per week (beer/wine)   Drug use: No   Sexual activity: Yes    Partners: Male    Birth control/protection: Post-menopausal  Other Topics Concern   Not on file  Social History Narrative   Not on file   Social Determinants of Health   Financial Resource Strain: Not on file  Food Insecurity: Not on file  Transportation Needs: Not on file  Physical Activity: Not on file  Stress: Not on file  Social Connections: Not on file  Intimate Partner Violence: Not on file    Outpatient Medications Prior to Visit  Medication Sig Dispense Refill   azelastine (ASTELIN) 0.1 % nasal spray Place 2 sprays into both nostrils 2 (two) times daily. Use in each nostril as directed 30 mL 5   blood glucose meter kit and supplies KIT Dispense based on patient and insurance preference. Use up to four times daily as directed. (FOR ICD-9 250.00, 250.01). 1 each 0   doxycycline (VIBRA-TABS) 100 MG tablet Take 1 tablet (100 mg total) by mouth 2 (two) times daily. 20 tablet 0   ezetimibe (ZETIA) 10 MG tablet Take 1 tablet (10 mg total) by mouth at bedtime. 30 tablet 2   gabapentin (NEURONTIN) 600 MG tablet TAKE 1 TABLET BY MOUTH 3  TIMES DAILY 270 tablet 3   hydrochlorothiazide (HYDRODIURIL) 25 MG tablet TAKE 1 TABLET BY MOUTH  DAILY 90 tablet 1   levothyroxine (SYNTHROID) 50 MCG tablet Take 1 tablet (50 mcg total) by mouth daily. 90 tablet 3   losartan (COZAAR) 100 MG tablet TAKE 1 TABLET BY MOUTH ONCE DAILY 90 tablet 1   metFORMIN (GLUCOPHAGE) 500 MG tablet Take 1 tablet (500 mg total) by mouth 2 (two) times daily with a meal. 60 tablet 2    methocarbamol (ROBAXIN) 500 MG tablet Take 1 tablet (500 mg total) by mouth in the morning and at bedtime. 60 tablet 3   metoprolol succinate (TOPROL-XL) 100 MG 24 hr tablet Take 1 tablet (100 mg total) by mouth daily. Take with or immediately following a meal.  90 tablet 3   montelukast (SINGULAIR) 10 MG tablet Take 1 tablet (10 mg total) by mouth at bedtime. 30 tablet 11   NON [place]  Scar Cream -  Verapamil 10%, Pentoxifylline 5% Apply 1-2 grams to affected area 3-4 times daily Qty. 120 gm 3 refills     PARoxetine (PAXIL) 30 MG tablet Take 1 tablet (30 mg total) by mouth daily. 90 tablet 1   pravastatin (PRAVACHOL) 40 MG tablet Take 1 tablet (40 mg total) by mouth daily. 90 tablet 0   solifenacin (VESICARE) 5 MG tablet Take 1 tablet (5 mg total) by mouth daily. 30 tablet 2   aspirin EC 81 MG tablet Take 1 tablet (81 mg total) by mouth daily. Swallow whole. (Patient not taking: Reported on 07/12/2021) 90 tablet 3   Vitamin D, Ergocalciferol, (DRISDOL) 1.25 MG (50000 UT) CAPS capsule Take 1 capsule (50,000 Units total) by mouth every 7 (seven) days. (Patient not taking: Reported on 09/27/2021) 4 capsule 0   Facility-Administered Medications Prior to Visit  Medication Dose Route Frequency Provider Last Rate Last Admin   0.9 %  sodium chloride infusion  500 mL Intravenous Once Ladene Artist, MD        Allergies  Allergen Reactions   Penicillins    Codeine Rash and Other (See Comments)    dizziness    Review of Systems  Skin:        (+)wound on right ankle      Objective:    Physical Exam Constitutional:      General: She is not in acute distress.    Appearance: Normal appearance. She is not ill-appearing.  HENT:     Head: Normocephalic and atraumatic.     Right Ear: External ear normal.     Left Ear: External ear normal.  Eyes:     Extraocular Movements: Extraocular movements intact.     Pupils: Pupils are equal, round, and reactive to light.   Cardiovascular:     Rate and Rhythm: Normal rate and regular rhythm.     Heart sounds: Normal heart sounds. No murmur heard.   No gallop.  Pulmonary:     Effort: Pulmonary effort is normal. No respiratory distress.     Breath sounds: Normal breath sounds. No wheezing or rales.  Skin:    General: Skin is warm and dry.     Comments: Laceration on on the back of the right ankle. No discharge,  or bleeding.  Min surrounding errythema   Neurological:     Mental Status: She is alert and oriented to person, place, and time.  Psychiatric:        Behavior: Behavior normal.        Judgment: Judgment normal.    BP 118/88 (BP Location: Left Arm, Patient Position: Sitting, Cuff Size: Normal)   Pulse 69   Temp 98.1 F (36.7 C) (Oral)   Resp 18   Ht _0  (1.6 m)   Wt 172 lb 6.4 oz (78.2 kg)   SpO2 96%   BMI 30.54 kg/m  Wt Readings from Last 3 Encounters:  10/04/21 172 lb 6.4 oz (78.2 kg)  09/27/21 174 lb 9.6 oz (79.2 kg)  09/13/21 174 lb (78.9 kg)    Diabetic Foot Exam - Simple   No data filed    Lab Results  Component Value Date   WBC 5.8 05/25/2021   HGB 11.6 (L) 05/25/2021   HCT 35.4 05/25/2021   PLT 260 05/25/2021  GLUCOSE 94 08/30/2021   CHOL 169 08/30/2021   TRIG 87.0 08/30/2021   HDL 52.00 08/30/2021   LDLDIRECT 126.3 06/06/2011   LDLCALC 100 (H) 08/30/2021   ALT 10 08/30/2021   AST 20 08/30/2021   NA 138 08/30/2021   K 4.0 08/30/2021   CL 103 08/30/2021   CREATININE 0.80 08/30/2021   BUN 17 08/30/2021   CO2 30 08/30/2021   TSH 1.26 05/25/2021   INR 1.0 05/25/2021   HGBA1C 7.1 (H) 08/30/2021   MICROALBUR 1.1 11/24/2020    Lab Results  Component Value Date   TSH 1.26 05/25/2021   Lab Results  Component Value Date   WBC 5.8 05/25/2021   HGB 11.6 (L) 05/25/2021   HCT 35.4 05/25/2021   MCV 84.5 05/25/2021   PLT 260 05/25/2021   Lab Results  Component Value Date   NA 138 08/30/2021   K 4.0 08/30/2021   CO2 30 08/30/2021   GLUCOSE 94 08/30/2021    BUN 17 08/30/2021   CREATININE 0.80 08/30/2021   BILITOT 0.5 08/30/2021   ALKPHOS 82 08/30/2021   AST 20 08/30/2021   ALT 10 08/30/2021   PROT 8.3 08/30/2021   ALBUMIN 4.3 08/30/2021   CALCIUM 10.0 08/30/2021   ANIONGAP 11 08/31/2014   GFR 76.95 08/30/2021   Lab Results  Component Value Date   CHOL 169 08/30/2021   Lab Results  Component Value Date   HDL 52.00 08/30/2021   Lab Results  Component Value Date   LDLCALC 100 (H) 08/30/2021   Lab Results  Component Value Date   TRIG 87.0 08/30/2021   Lab Results  Component Value Date   CHOLHDL 3 08/30/2021   Lab Results  Component Value Date   HGBA1C 7.1 (H) 08/30/2021       Assessment & Plan:   Problem List Items Addressed This Visit       Unprioritized   Wound infection - Primary   Relevant Medications   sulfamethoxazole-trimethoprim (BACTRIM DS) 800-160 MG tablet   Other Relevant Orders   Ambulatory referral to Wound Clinic   Wound of right ankle    Healing well but pt is concerned Bactrim sent In and wound referral placed  rto 2 weeks or sooner prn        Meds ordered this encounter  Medications   sulfamethoxazole-trimethoprim (BACTRIM DS) 800-160 MG tablet    Sig: Take 1 tablet by mouth 2 (two) times daily.    Dispense:  20 tablet    Refill:  0    I, Dr. Roma Schanz, DO, personally preformed the services described in this documentation.  All medical record entries made by the scribe were at my direction and in my presence.  I have reviewed the chart and discharge instructions (if applicable) and agree that the record reflects my personal performance and is accurate and complete. 10/04/2021   I,[name],acting as a scribe for Ann Held, DO.,have documented all relevant documentation on the behalf of Ann Held, DO,as directed by  Ann Held, DO while in the presence of Ann Held, DO.   Ann Held, DO

## 2021-10-04 NOTE — Assessment & Plan Note (Signed)
Healing well but pt is concerned Bactrim sent In and wound referral placed  rto 2 weeks or sooner prn

## 2021-10-09 ENCOUNTER — Ambulatory Visit: Payer: Medicare Other | Admitting: Orthopedic Surgery

## 2021-10-10 ENCOUNTER — Encounter (HOSPITAL_BASED_OUTPATIENT_CLINIC_OR_DEPARTMENT_OTHER): Payer: Medicare Other | Attending: Physician Assistant | Admitting: Physician Assistant

## 2021-10-10 ENCOUNTER — Other Ambulatory Visit: Payer: Self-pay

## 2021-10-10 DIAGNOSIS — I10 Essential (primary) hypertension: Secondary | ICD-10-CM | POA: Insufficient documentation

## 2021-10-10 DIAGNOSIS — E11621 Type 2 diabetes mellitus with foot ulcer: Secondary | ICD-10-CM | POA: Diagnosis not present

## 2021-10-10 DIAGNOSIS — Y998 Other external cause status: Secondary | ICD-10-CM | POA: Insufficient documentation

## 2021-10-10 DIAGNOSIS — L97512 Non-pressure chronic ulcer of other part of right foot with fat layer exposed: Secondary | ICD-10-CM | POA: Insufficient documentation

## 2021-10-10 DIAGNOSIS — S91311A Laceration without foreign body, right foot, initial encounter: Secondary | ICD-10-CM | POA: Diagnosis not present

## 2021-10-10 DIAGNOSIS — F17200 Nicotine dependence, unspecified, uncomplicated: Secondary | ICD-10-CM | POA: Diagnosis not present

## 2021-10-10 NOTE — Progress Notes (Signed)
Debra, [name] ([id number]) Visit Report for 10/10/2021 Abuse/Suicide Risk Screen Details Patient Name: Date of Service: Debra [name] 10/10/2021 7:30 A M Medical Record Number: [id number] Patient Account Number: [id number] Date of Birth/Sex: Treating RN: July 25, 1956 (65 y.o. Debra [name] Primary Care Layman Gully: Debra [name] Other Clinician: Referring Dicie Edelen: Treating Acelynn Dejonge/Extender: Jackelyn Knife in Treatment: 0 Abuse/Suicide Risk Screen Items Answer ABUSE RISK SCREEN: Has anyone close to you tried to hurt or harm you recentlyo No Do you feel uncomfortable with anyone in your familyo No Has anyone forced you do things that you didnt want to doo No Electronic Signature(s) Signed: 10/10/2021 5:19:40 PM By: Lorrin Jackson Entered By: Lorrin Jackson on 10/10/2021 08:10:08 -------------------------------------------------------------------------------- Activities of Daily Living Details Patient Name: Date of Service: Debra [name]. 10/10/2021 7:30 A M Medical Record Number: [id number] Patient Account Number: [id number] Date of Birth/Sex: Treating RN: 09-Jul-1956 (65 y.o. Debra [name] Primary Care Labrittany Wechter: Debra [name] Other Clinician: Referring Debra [name]: Treating Debra [name]/Extender: Jackelyn Knife in Treatment: 0 Activities of Daily Living Items Answer Activities of Daily Living (Please select one for each item) Drive Automobile Completely Able T Medications ake Completely Able Use T elephone Completely Able Care for Appearance Completely Able Use T oilet Completely Able Bath / Shower Completely Able Dress Self Completely Able Feed Self Completely Able Walk Completely Able Get In / Out Bed Completely Able Housework Completely Able Prepare Meals Completely [place] for Self Completely Able Electronic Signature(s) Signed: 10/10/2021 5:19:40 PM By:  Lorrin Jackson Entered By: Lorrin Jackson on 10/10/2021 08:00:11 -------------------------------------------------------------------------------- Education Screening Details Patient Name: Date of Service: Debra Confer R. 10/10/2021 7:30 A M Medical Record Number: [id number] Patient Account Number: [id number] Date of Birth/Sex: Treating RN: [birth year] (65 y.o. Debra [name] Primary Care Ammie Warrick: Debra [name] Other Clinician: Referring Debra [name]: Treating Debra [name]/Extender: Jackelyn Knife in Treatment: 0 Primary Learner Assessed: Patient Learning Preferences/Education Level/Primary Language Learning Preference: Explanation, Demonstration, Printed Material Highest Education Level: College or Above Preferred Language: English Cognitive Barrier Language Barrier: No Translator Needed: No Memory Deficit: No Emotional Barrier: No Cultural/Religious Beliefs Affecting Medical Care: No Physical Barrier Impaired Vision: No Impaired Hearing: No Decreased Hand dexterity: No Knowledge/Comprehension Knowledge Level: High Comprehension Level: High Ability to understand written instructions: High Ability to understand verbal instructions: High Motivation Anxiety Level: Calm Cooperation: Cooperative Education Importance: Acknowledges Need Interest in Health Problems: Asks Questions Perception: Coherent Willingness to Engage in Self-Management High Activities: Readiness to Engage in Self-Management High Activities: Electronic Signature(s) Signed: 10/10/2021 5:19:40 PM By: Lorrin Jackson Entered By: Lorrin Jackson on 10/10/2021 08:10:38 -------------------------------------------------------------------------------- Fall Risk Assessment Details Patient Name: Date of Service: Debra Confer R. 10/10/2021 7:30 A M Medical Record Number: [id number] Patient Account Number: [id number] Date of Birth/Sex: Treating RN: 11-04-1955 (65 y.o. Debra [name] Primary Care Debra [name]: Debra [name] Other Clinician: Referring Debra [name]: Treating Debra [name]/Extender: Jackelyn Knife in Treatment: 0 Fall Risk Assessment Items Have you had 2 or more falls in the last 12 monthso 0 No Have you had any fall that resulted in injury in the last 12 monthso 0 No FALLS RISK SCREEN History of falling - immediate or within 3 months 0 No Secondary diagnosis (Do you have 2 or more medical diagnoseso) 15 Yes Ambulatory aid None/bed rest/wheelchair/nurse 0 Yes Crutches/cane/walker 0 No Furniture 0 No Intravenous therapy Access/Saline/Heparin Lock 0 No Gait/Transferring Normal/ bed rest/  wheelchair 0 Yes Weak (short steps with or without shuffle, stooped but able to lift head while walking, may seek 0 No support from furniture) Impaired (short steps with shuffle, may have difficulty arising from chair, head down, impaired 0 No balance) Mental Status Oriented to own ability 0 Yes Electronic Signature(s) Signed: 10/10/2021 5:19:40 PM By: Lorrin Jackson Entered By: Lorrin Jackson on 10/10/2021 08:10:54 -------------------------------------------------------------------------------- Foot Assessment Details Patient Name: Date of Service: Debra Confer R. 10/10/2021 7:30 A M Medical Record Number: [id number] Patient Account Number: [id number] Date of Birth/Sex: Treating RN: 1956-08-01 (65 y.o. Debra [name] Primary Care Debra [name]: Debra [name] Other Clinician: Referring Debra [name]: Treating Debra [name]/Extender: Jackelyn Knife in Treatment: 0 Foot Assessment Items Site Locations + = Sensation present, - = Sensation absent, C = Callus, U = Ulcer R = Redness, W = Warmth, M = Maceration, PU = Pre-ulcerative lesion F = Fissure, S = Swelling, D = Dryness Assessment Right: Left: Other Deformity: No No Prior Foot Ulcer: No No Prior Amputation: No No Charcot Joint: No  No Ambulatory Status: Ambulatory Without Help Gait: Steady Electronic Signature(s) Signed: 10/10/2021 5:19:40 PM By: Lorrin Jackson Entered By: Lorrin Jackson on 10/10/2021 08:29:01 -------------------------------------------------------------------------------- Nutrition Risk Screening Details Patient Name: Date of Service: Debra Confer R. 10/10/2021 7:30 A M Medical Record Number: [id number] Patient Account Number: [id number] Date of Birth/Sex: Treating RN: August 15, 1956 (65 y.o. Debra [name] Primary Care Josslyn Ciolek: Debra [name] Other Clinician: Referring Arnetia Bronk: Treating Dayden Viverette/Extender: Jackelyn Knife in Treatment: 0 Height (in): 63 Weight (lbs): 172 Body Mass Index (BMI): 30.5 Nutrition Risk Screening Items Score Screening NUTRITION RISK SCREEN: I have an illness or condition that made me change the kind and/or amount of food I eat 0 No I eat fewer than two meals per day 0 No I eat few fruits and vegetables, or milk products 0 No I have three or more drinks of beer, liquor or wine almost every day 0 No I have tooth or mouth problems that make it hard for me to eat 0 No I don't always have enough money to buy the food I need 0 No I eat alone most of the time 0 No I take three or more different prescribed or over-the-counter drugs a day 1 Yes Without wanting to, I have lost or gained 10 pounds in the last six months 0 No I am not always physically able to shop, cook and/or feed myself 0 No Nutrition Protocols Good Risk Protocol 0 No interventions needed Moderate Risk Protocol High Risk Proctocol Risk Level: Good Risk Score: 1 Electronic Signature(s) Signed: 10/10/2021 5:19:40 PM By: Lorrin Jackson Entered By: Lorrin Jackson on 10/10/2021 08:11:12

## 2021-10-10 NOTE — Progress Notes (Signed)
HYDIA, COPELIN ([id number]) Visit Report for 10/10/2021 Allergy List Details Patient Name: Date of Service: Debra [name]. 10/10/2021 7:30 A M Medical Record Number: [id number] Patient Account Number: [id number] Date of Birth/Sex: Treating RN: November 02, 1955 (65 y.o. Sue Lush Primary Care Teagan Heidrick: Roma Schanz Other Clinician: Referring Jadis Pitter: Treating Aleyda Gindlesperger/Extender: Jackelyn Knife in Treatment: 0 Allergies Active Allergies penicillin codeine Reaction: rash, itching Allergy Notes Electronic Signature(s) Signed: 10/10/2021 5:19:40 PM By: Lorrin Jackson Entered By: Lorrin Jackson on 10/10/2021 07:59:46 -------------------------------------------------------------------------------- Arrival Information Details Patient Name: Date of Service: Debra Confer R. 10/10/2021 7:30 A M Medical Record Number: [id number] Patient Account Number: [id number] Date of Birth/Sex: Treating RN: February 17, 1956 (65 y.o. Sue Lush Primary Care Lenon Kuennen: Roma Schanz Other Clinician: Referring Mayola Mcbain: Treating Santos Sollenberger/Extender: Jackelyn Knife in Treatment: 0 Visit Information Patient Arrived: Ambulatory Arrival Time: 07:55 Transfer Assistance: None Patient Identification Verified: Yes Secondary Verification Process Completed: Yes Patient Requires Transmission-Based Precautions: No Patient Has Alerts: No Electronic Signature(s) Signed: 10/10/2021 5:19:40 PM By: Lorrin Jackson Entered By: Lorrin Jackson on 10/10/2021 07:58:54 -------------------------------------------------------------------------------- Clinic Level of Care Assessment Details Patient Name: Date of Service: Debra Confer R. 10/10/2021 7:30 A M Medical Record Number: [id number] Patient Account Number: [id number] Date of Birth/Sex: Treating RN: August 10, 1956 (65 y.o. Sue Lush Primary Care Miqueas Whilden: Roma Schanz Other  Clinician: Referring Liliani Bobo: Treating Caitland Porchia/Extender: Jackelyn Knife in Treatment: 0 Clinic Level of Care Assessment Items TOOL 1 Quantity Score X- 1 0 Use when EandM and Procedure is performed on INITIAL visit ASSESSMENTS - Nursing Assessment / Reassessment X- 1 20 General Physical Exam (combine w/ comprehensive assessment (listed just below) when performed on new pt. evals) X- 1 25 Comprehensive Assessment (HX, ROS, Risk Assessments, Wounds Hx, etc.) ASSESSMENTS - Wound and Skin Assessment / Reassessment []  - 0 Dermatologic / Skin Assessment (not related to wound area) ASSESSMENTS - Ostomy and/or Continence Assessment and Care []  - 0 Incontinence Assessment and Management []  - 0 Ostomy Care Assessment and Management (repouching, etc.) PROCESS - Coordination of Care []  - 0 Simple Patient / Family Education for ongoing care X- 1 20 Complex (extensive) Patient / Family Education for ongoing care X- 1 10 Staff obtains Programmer, systems, Records, T Results / Process Orders est []  - 0 Staff telephones HHA, Nursing Homes / Clarify orders / etc []  - 0 Routine Transfer to another Facility (non-emergent condition) []  - 0 Routine Hospital Admission (non-emergent condition) []  - 0 New Admissions / Biomedical engineer / Ordering NPWT Apligraf, etc. , []  - 0 Emergency Hospital Admission (emergent condition) PROCESS - Special Needs []  - 0 Pediatric / Minor Patient Management []  - 0 Isolation Patient Management []  - 0 Hearing / Language / Visual special needs []  - 0 Assessment of Community assistance (transportation, D/C planning, etc.) []  - 0 Additional assistance / Altered mentation []  - 0 Support Surface(s) Assessment (bed, cushion, seat, etc.) INTERVENTIONS - Miscellaneous []  - 0 External ear exam []  - 0 Patient Transfer (multiple staff / Civil Service fast streamer / Similar devices) []  - 0 Simple Staple / Suture removal (25 or less) []  - 0 Complex  Staple / Suture removal (26 or more) []  - 0 Hypo/Hyperglycemic Management (do not check if billed separately) X- 1 15 Ankle / Brachial Index (ABI) - do not check if billed separately Has the patient been seen at the hospital within the last three years: Yes Total Score: 90 Level Of Care:  New/Established - Level 3 Electronic Signature(s) Signed: 10/10/2021 5:19:40 PM By: Lorrin Jackson Entered By: Lorrin Jackson on 10/10/2021 08:55:03 -------------------------------------------------------------------------------- Encounter Discharge Information Details Patient Name: Date of Service: Debra Confer R. 10/10/2021 7:30 A M Medical Record Number: [id number] Patient Account Number: [id number] Date of Birth/Sex: Treating RN: November 07, 1955 (65 y.o. Sue Lush Primary Care Davelle Anselmi: Roma Schanz Other Clinician: Referring Beyonce Sawatzky: Treating Avram Danielson/Extender: Jackelyn Knife in Treatment: 0 Encounter Discharge Information Items Post Procedure Vitals Discharge Condition: Stable Temperature (F): 98.1 Ambulatory Status: Ambulatory Pulse (bpm): 73 Discharge Destination: Home Respiratory Rate (breaths/min): 16 Transportation: Private Auto Blood Pressure (mmHg): 101/64 Schedule Follow-up Appointment: Yes Clinical Summary of Care: Provided on 10/10/2021 Form Type Recipient Paper Patient Patient Electronic Signature(s) Signed: 10/10/2021 5:19:40 PM By: Lorrin Jackson Entered By: Lorrin Jackson on 10/10/2021 09:04:04 -------------------------------------------------------------------------------- Lower Extremity Assessment Details Patient Name: Date of Service: Debra Confer R. 10/10/2021 7:30 A M Medical Record Number: [id number] Patient Account Number: [id number] Date of Birth/Sex: Treating RN: 1956/01/10 (65 y.o. Sue Lush Primary Care Cherie Lasalle: Roma Schanz Other Clinician: Referring Zamir Staples: Treating Tadan Shill/Extender:  Jackelyn Knife in Treatment: 0 Edema Assessment Assessed: [Left: No] [Right: Yes] Edema: [Left: N] [Right: o] Calf Left: Right: Point of Measurement: 26 cm From Medial Instep 34 cm Ankle Left: Right: Point of Measurement: 6 cm From Medial Instep 31.5 cm Vascular Assessment Pulses: Dorsalis Pedis Palpable: [Right:Yes] Blood Pressure: Brachial: [Right:101] Ankle: [Right:Dorsalis Pedis: 96 0.95] Electronic Signature(s) Signed: 10/10/2021 5:19:40 PM By: Lorrin Jackson Entered By: Lorrin Jackson on 10/10/2021 08:24:25 -------------------------------------------------------------------------------- Multi Wound Chart Details Patient Name: Date of Service: Debra Confer R. 10/10/2021 7:30 A M Medical Record Number: [id number] Patient Account Number: [id number] Date of Birth/Sex: Treating RN: May 24, 1956 (65 y.o. Sue Lush Primary Care Rahim Astorga: Roma Schanz Other Clinician: Referring Bayleigh Loflin: Treating Earlin Sweeden/Extender: Jackelyn Knife in Treatment: 0 Vital Signs Height(in): 63 Pulse(bpm): 92 Weight(lbs): 172 Blood Pressure(mmHg): 101/64 Body Mass Index(BMI): 30 Temperature(F): 98.1 Respiratory Rate(breaths/min): 16 Photos: [1:No Photos Right, Posterior Foot] [N/A:N/A N/A] Wound Location: [1:Trauma] [N/A:N/A] Wounding Event: [1:Trauma, Other] [N/A:N/A] Primary Etiology: [1:Anemia, Coronary Artery Disease,] [N/A:N/A] Comorbid History: [1:Hypertension, Type II Diabetes, Osteoarthritis, Neuropathy 09/24/2021] [N/A:N/A] Date Acquired: [1:0] [N/A:N/A] Weeks of Treatment: [1:Open] [N/A:N/A] Wound Status: [1:0.3x0.5x0.2] [N/A:N/A] Measurements L x W x D (cm) [1:0.118] [N/A:N/A] A (cm) : rea [1:0.024] [N/A:N/A] Volume (cm) : [1:Full Thickness Without Exposed] [N/A:N/A] Classification: [1:Support Structures Medium] [N/A:N/A] Exudate A mount: [1:Serosanguineous] [N/A:N/A] Exudate Type: [1:red, brown]  [N/A:N/A] Exudate Color: [1:Distinct, outline attached] [N/A:N/A] Wound Margin: [1:Large (67-100%)] [N/A:N/A] Granulation A mount: [1:Red] [N/A:N/A] Granulation Quality: [1:None Present (0%)] [N/A:N/A] Necrotic A mount: [1:Fat Layer (Subcutaneous Tissue): Yes N/A] Exposed Structures: [1:Fascia: No Tendon: No Muscle: No Joint: No Bone: No None] [N/A:N/A] Epithelialization: [1:Debridement - Excisional] [N/A:N/A] Debridement: Pre-procedure Verification/Time Out 08:41 [N/A:N/A] Taken: [1:Other] [N/A:N/A] Pain Control: [1:Callus, Subcutaneous] [N/A:N/A] Tissue Debrided: [1:Skin/Subcutaneous Tissue] [N/A:N/A] Level: [1:0.15] [N/A:N/A] Debridement A (sq cm): [1:rea Curette] [N/A:N/A] Instrument: [1:Minimum] [N/A:N/A] Bleeding: [1:Pressure] [N/A:N/A] Hemostasis A chieved: [1:Procedure was tolerated well] [N/A:N/A] Debridement Treatment Response: [1:0.3x0.5x0.2] [N/A:N/A] Post Debridement Measurements L x W x D (cm) [1:0.024] [N/A:N/A] Post Debridement Volume: (cm) [1:Debridement] [N/A:N/A] Treatment Notes Wound #1 (Foot) Wound Laterality: Right, Posterior Cleanser Soap and Water Discharge Instruction: May shower and wash wound with dial antibacterial soap and water prior to dressing change. Peri-Wound Care Topical Primary Dressing Promogran Prisma Matrix, 4.34 (sq in) (silver collagen) Discharge Instruction: Moisten collagen with  saline or hydrogel Secondary Dressing Zetuvit Plus Silicone Border Dressing 4x4 (in/in) Discharge Instruction: Apply silicone border over primary dressing as directed. Secured With Compression Wrap Compression Stockings Add-Ons AddiPak Unit Dose Saline 5 (ml) Discharge Instruction: Saline to [facility]) Signed: 10/10/2021 3:00:22 PM By: Lorrin Jackson Entered By: Lorrin Jackson on 10/10/2021 15:00:21 -------------------------------------------------------------------------------- Multi-Disciplinary Care Plan Details Patient  Name: Date of Service: Debra [name], Debra Shropshire R. 10/10/2021 7:30 A M Medical Record Number: [id number] Patient Account Number: [id number] Date of Birth/Sex: Treating RN: 1956/02/24 (65 y.o. Sue Lush Primary Care Jodie Leiner: Roma Schanz Other Clinician: Referring Zakary Kimura: Treating Jazzie Trampe/Extender: Jackelyn Knife in Treatment: 0 Active Inactive Wound/Skin Impairment Nursing Diagnoses: Impaired tissue integrity Goals: Patient/caregiver will verbalize understanding of skin care regimen Date Initiated: 10/10/2021 Target Resolution Date: 11/07/2021 Goal Status: Active Ulcer/skin breakdown will have a volume reduction of 30% by week 4 Date Initiated: 10/10/2021 Target Resolution Date: 11/07/2021 Goal Status: Active Interventions: Assess patient/caregiver ability to obtain necessary supplies Assess patient/caregiver ability to perform ulcer/skin care regimen upon admission and as needed Assess ulceration(s) every visit Provide education on ulcer and skin care Treatment Activities: Topical wound management initiated : 10/10/2021 Notes: Electronic Signature(s) Signed: 10/10/2021 5:19:40 PM By: Lorrin Jackson Entered By: Lorrin Jackson on 10/10/2021 08:29:43 -------------------------------------------------------------------------------- Pain Assessment Details Patient Name: Date of Service: Debra Confer R. 10/10/2021 7:30 A M Medical Record Number: [id number] Patient Account Number: [id number] Date of Birth/Sex: Treating RN: 1956/10/22 (65 y.o. Sue Lush Primary Care Aalaya Yadao: Roma Schanz Other Clinician: Referring Loise Esguerra: Treating Mayetta Castleman/Extender: Jackelyn Knife in Treatment: 0 Active Problems Location of Pain Severity and Description of Pain Patient Has Paino Yes Site Locations Pain Location: Pain in Ulcers With Dressing Change: Yes Duration of the Pain. Constant / Intermittento  Intermittent Rate the pain. Current Pain Level: 3 Character of Pain Describe the Pain: Tender Pain Management and Medication Current Pain Management: Medication: No Cold Application: No Rest: Yes Massage: No Activity: No T.E.N.S.: No Heat Application: No Leg drop or elevation: No Is the Current Pain Management Adequate: Adequate How does your wound impact your activities of daily livingo Sleep: No Bathing: No Appetite: No Relationship With Others: No Bladder Continence: No Emotions: No Bowel Continence: No Work: No Toileting: No Drive: No Dressing: No Hobbies: No Electronic Signature(s) Signed: 10/10/2021 5:19:40 PM By: Lorrin Jackson Entered By: Lorrin Jackson on 10/10/2021 08:24:59 -------------------------------------------------------------------------------- Patient/Caregiver Education Details Patient Name: Date of Service: Debra [name] 12/14/2022andnbsp7:30 A M Medical Record Number: [id number] Patient Account Number: [id number] Date of Birth/Gender: Treating RN: 04/07/1956 (65 y.o. Sue Lush Primary Care Physician: Roma Schanz Other Clinician: Referring Physician: Treating Physician/Extender: Jackelyn Knife in Treatment: 0 Education Assessment Education Provided To: Patient Education Topics Provided Wound/Skin Impairment: Methods: Demonstration, Explain/Verbal, Printed Responses: State content correctly Electronic Signature(s) Signed: 10/10/2021 5:19:40 PM By: Lorrin Jackson Entered By: Lorrin Jackson on 10/10/2021 08:29:55 -------------------------------------------------------------------------------- Wound Assessment Details Patient Name: Date of Service: Debra Confer R. 10/10/2021 7:30 A M Medical Record Number: [id number] Patient Account Number: [id number] Date of Birth/Sex: Treating RN: [birth year] (65 y.o. Sue Lush Primary Care Jah Alarid: Roma Schanz Other  Clinician: Referring Janney Priego: Treating Lochlan Grygiel/Extender: Jackelyn Knife in Treatment: 0 Wound Status Wound Number: 1 Primary Trauma, Other Etiology: Wound Location: Right, Posterior Foot Wound Open Wounding Event: Trauma Status: Date Acquired: 09/24/2021 Comorbid Anemia, Coronary Artery Disease, Hypertension, Type II Weeks Of Treatment:  0 History: Diabetes, Osteoarthritis, Neuropathy Clustered Wound: No Wound Measurements Length: (cm) 0.3 Width: (cm) 0.5 Depth: (cm) 0.2 Area: (cm) 0.118 Volume: (cm) 0.024 % Reduction in Area: % Reduction in Volume: Epithelialization: None Tunneling: No Undermining: No Wound Description Classification: Full Thickness Without Exposed Support Structures Wound Margin: Distinct, outline attached Exudate Amount: Medium Exudate Type: Serosanguineous Exudate Color: red, brown Foul Odor After Cleansing: No Slough/Fibrino No Wound Bed Granulation Amount: Large (67-100%) Exposed Structure Granulation Quality: Red Fascia Exposed: No Necrotic Amount: None Present (0%) Fat Layer (Subcutaneous Tissue) Exposed: Yes Tendon Exposed: No Muscle Exposed: No Joint Exposed: No Bone Exposed: No Treatment Notes Wound #1 (Foot) Wound Laterality: Right, Posterior Cleanser Soap and Water Discharge Instruction: May shower and wash wound with dial antibacterial soap and water prior to dressing change. Peri-Wound Care Topical Primary Dressing Promogran Prisma Matrix, 4.34 (sq in) (silver collagen) Discharge Instruction: Moisten collagen with saline or hydrogel Secondary Dressing Zetuvit Plus Silicone Border Dressing 4x4 (in/in) Discharge Instruction: Apply silicone border over primary dressing as directed. Secured With Compression Wrap Compression Stockings Add-Ons AddiPak Unit Dose Saline 5 (ml) Discharge Instruction: Saline to [facility]) Signed: 10/10/2021 5:19:40 PM By: Lorrin Jackson Entered By: Lorrin Jackson on 10/10/2021 08:17:17 -------------------------------------------------------------------------------- Vitals Details Patient Name: Date of Service: Debra [name], Debra Shropshire R. 10/10/2021 7:30 A M Medical Record Number: [id number] Patient Account Number: [id number] Date of Birth/Sex: Treating RN: [birth year] (65 y.o. Sue Lush Primary Care Ramello Cordial: Roma Schanz Other Clinician: Referring Andera Cranmer: Treating Ladawn Boullion/Extender: Jackelyn Knife in Treatment: 0 Vital Signs Time Taken: 08:03 Temperature (F): 98.1 Height (in): 63 Pulse (bpm): 73 Source: Stated Respiratory Rate (breaths/min): 16 Weight (lbs): 172 Blood Pressure (mmHg): 101/64 Source: Stated Reference Range: 80 - 120 mg / dl Body Mass Index (BMI): 30.5 Electronic Signature(s) Signed: 10/10/2021 5:19:40 PM By: Lorrin Jackson Entered By: Lorrin Jackson on 10/10/2021 08:03:58
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
## 2021-10-12 DIAGNOSIS — S91311A Laceration without foreign body, right foot, initial encounter: Secondary | ICD-10-CM | POA: Diagnosis not present

## 2021-10-12 DIAGNOSIS — L97512 Non-pressure chronic ulcer of other part of right foot with fat layer exposed: Secondary | ICD-10-CM | POA: Diagnosis not present

## 2021-10-17 ENCOUNTER — Other Ambulatory Visit: Payer: Self-pay

## 2021-10-17 ENCOUNTER — Encounter (HOSPITAL_BASED_OUTPATIENT_CLINIC_OR_DEPARTMENT_OTHER): Payer: Medicare Other | Admitting: Physician Assistant

## 2021-10-17 DIAGNOSIS — S91311A Laceration without foreign body, right foot, initial encounter: Secondary | ICD-10-CM | POA: Diagnosis not present

## 2021-10-17 DIAGNOSIS — L97512 Non-pressure chronic ulcer of other part of right foot with fat layer exposed: Secondary | ICD-10-CM | POA: Diagnosis not present

## 2021-10-17 DIAGNOSIS — I10 Essential (primary) hypertension: Secondary | ICD-10-CM | POA: Diagnosis not present

## 2021-10-17 DIAGNOSIS — F17200 Nicotine dependence, unspecified, uncomplicated: Secondary | ICD-10-CM | POA: Diagnosis not present

## 2021-10-17 DIAGNOSIS — E11621 Type 2 diabetes mellitus with foot ulcer: Secondary | ICD-10-CM | POA: Diagnosis not present
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
## 2021-10-18 NOTE — Progress Notes (Signed)
Debra, [name] ([id number]) Visit Report for 10/17/2021 Arrival Information Details Patient Name: Date of Service: Debra [name], [place] 10/17/2021 1:30 PM Medical Record Number: [id number] Patient Account Number: [id number] Date of Birth/Sex: Treating RN: [birth year] (65 y.o. Martyn Malay, Linda Primary Care Ronda Kazmi: Roma Schanz Other Clinician: Referring Brit Wernette: Treating Liisa Picone/Extender: Jackelyn Knife in Treatment: 1 Visit Information History Since Last Visit Added or deleted any medications: No Patient Arrived: Ambulatory Any new allergies or adverse reactions: No Arrival Time: 13:47 Had a fall or experienced change in No Accompanied By: self activities of daily living that may affect Transfer Assistance: None risk of falls: Patient Identification Verified: Yes Signs or symptoms of abuse/neglect since last visito No Secondary Verification Process Completed: Yes Hospitalized since last visit: No Patient Requires Transmission-Based Precautions: No Implantable device outside of the clinic excluding No Patient Has Alerts: No cellular tissue based products placed in the center since last visit: Has Dressing in Place as Prescribed: Yes Has Compression in Place as Prescribed: Yes Has Footwear/Offloading in Place as Prescribed: Yes Pain Present Now: No Electronic Signature(s) Signed: 10/18/2021 9:32:36 AM By: Sandre Kitty Entered By: Sandre Kitty on 10/17/2021 13:55:21 -------------------------------------------------------------------------------- Clinic Level of Care Assessment Details Patient Name: Date of Service: Debra [name]. 10/17/2021 1:30 PM Medical Record Number: [id number] Patient Account Number: [id number] Date of Birth/Sex: Treating RN: 1956-03-22 (65 y.o. Helene Shoe, Meta.Reding Primary Care Sael Furches: Roma Schanz Other Clinician: Referring Joyous Gleghorn: Treating Kaveh Kissinger/Extender: Jackelyn Knife in Treatment: 1 Clinic Level of Care Assessment Items TOOL 4 Quantity Score X- 1 0 Use when only an EandM is performed on FOLLOW-UP visit ASSESSMENTS - Nursing Assessment / Reassessment X- 1 10 Reassessment of Co-morbidities (includes updates in patient status) X- 1 5 Reassessment of Adherence to Treatment Plan ASSESSMENTS - Wound and Skin A ssessment / Reassessment X - Simple Wound Assessment / Reassessment - one wound 1 5 []  - 0 Complex Wound Assessment / Reassessment - multiple wounds X- 1 10 Dermatologic / Skin Assessment (not related to wound area) ASSESSMENTS - Focused Assessment X- 1 5 Circumferential Edema Measurements - multi extremities X- 1 10 Nutritional Assessment / Counseling / Intervention []  - 0 Lower Extremity Assessment (monofilament, tuning fork, pulses) []  - 0 Peripheral Arterial Disease Assessment (using hand held doppler) ASSESSMENTS - Ostomy and/or Continence Assessment and Care []  - 0 Incontinence Assessment and Management []  - 0 Ostomy Care Assessment and Management (repouching, etc.) PROCESS - Coordination of Care X - Simple Patient / Family Education for ongoing care 1 15 []  - 0 Complex (extensive) Patient / Family Education for ongoing care X- 1 10 Staff obtains Programmer, systems, Records, T Results / Process Orders est []  - 0 Staff telephones HHA, Nursing Homes / Clarify orders / etc []  - 0 Routine Transfer to another Facility (non-emergent condition) []  - 0 Routine Hospital Admission (non-emergent condition) []  - 0 New Admissions / Biomedical engineer / Ordering NPWT Apligraf, etc. , []  - 0 Emergency Hospital Admission (emergent condition) X- 1 10 Simple Discharge Coordination []  - 0 Complex (extensive) Discharge Coordination PROCESS - Special Needs []  - 0 Pediatric / Minor Patient Management []  - 0 Isolation Patient Management []  - 0 Hearing / Language / Visual special needs []  - 0 Assessment of Community assistance  (transportation, D/C planning, etc.) []  - 0 Additional assistance / Altered mentation []  - 0 Support Surface(s) Assessment (bed, cushion, seat, etc.) INTERVENTIONS - Wound Cleansing / Measurement X - Simple  Wound Cleansing - one wound 1 5 []  - 0 Complex Wound Cleansing - multiple wounds X- 1 5 Wound Imaging (photographs - any number of wounds) []  - 0 Wound Tracing (instead of photographs) X- 1 5 Simple Wound Measurement - one wound []  - 0 Complex Wound Measurement - multiple wounds INTERVENTIONS - Wound Dressings []  - 0 Small Wound Dressing one or multiple wounds []  - 0 Medium Wound Dressing one or multiple wounds []  - 0 Large Wound Dressing one or multiple wounds []  - 0 Application of Medications - topical []  - 0 Application of Medications - injection INTERVENTIONS - Miscellaneous []  - 0 External ear exam []  - 0 Specimen Collection (cultures, biopsies, blood, body fluids, etc.) []  - 0 Specimen(s) / Culture(s) sent or taken to Lab for analysis []  - 0 Patient Transfer (multiple staff / Civil Service fast streamer / Similar devices) []  - 0 Simple Staple / Suture removal (25 or less) []  - 0 Complex Staple / Suture removal (26 or more) []  - 0 Hypo / Hyperglycemic Management (close monitor of Blood Glucose) []  - 0 Ankle / Brachial Index (ABI) - do not check if billed separately X- 1 5 Vital Signs Has the patient been seen at the hospital within the last three years: Yes Total Score: 100 Level Of Care: New/Established - Level 3 Electronic Signature(s) Signed: 10/17/2021 6:10:28 PM By: Deon Pilling RN, BSN Entered By: Deon Pilling on 10/17/2021 16:56:31 -------------------------------------------------------------------------------- Encounter Discharge Information Details Patient Name: Date of Service: Debra [name], Kellie Shropshire R. 10/17/2021 1:30 PM Medical Record Number: [id number] Patient Account Number: [id number] Date of Birth/Sex: Treating RN: 1956-03-17 (65 y.o. [name] Primary Care Chanele Douglas: Roma Schanz Other Clinician: Referring Sage Kopera: Treating Clydell Alberts/Extender: Jackelyn Knife in Treatment: 1 Encounter Discharge Information Items Discharge Condition: Stable Ambulatory Status: Ambulatory Discharge Destination: Home Transportation: Private Auto Accompanied By: self Schedule Follow-up Appointment: Yes Clinical Summary of Care: Electronic Signature(s) Signed: 10/17/2021 6:10:28 PM By: Deon Pilling RN, BSN Entered By: Deon Pilling on 10/17/2021 16:57:14 -------------------------------------------------------------------------------- Lower Extremity Assessment Details Patient Name: Date of Service: [name] 10/17/2021 1:30 PM Medical Record Number: [id number] Patient Account Number: [id number] Date of Birth/Sex: Treating RN: 08/28/1956 (65 y.o. [name] Primary Care Naje Rice: Roma Schanz Other Clinician: Referring Nessie Nong: Treating [place]/Extender: Jackelyn Knife in Treatment: 1 Edema Assessment Assessed: [Left: No] [Right: Yes] Edema: [Left: N] [Right: o] Calf Left: Right: Point of Measurement: 26 cm From Medial Instep 34 cm Ankle Left: Right: Point of Measurement: 6 cm From Medial Instep 31 cm Vascular Assessment Pulses: Dorsalis Pedis Palpable: [Right:Yes] Electronic Signature(s) Signed: 10/17/2021 6:10:28 PM By: Deon Pilling RN, BSN Entered By: Deon Pilling on 10/17/2021 14:01:12 -------------------------------------------------------------------------------- [place] Details Patient Name: Date of Service: Debra [name], Kellie Shropshire R. 10/17/2021 1:30 PM Medical Record Number: [id number] Patient Account Number: [id number] Date of Birth/Sex: Treating RN: 1956-05-19 (65 y.o. [name] Primary Care Keyarah Mcroy: Roma Schanz Other Clinician: Referring Alvar Malinoski: Treating Ellerie Arenz/Extender: Jackelyn Knife in Treatment: 1 Active Inactive Electronic Signature(s) Signed: 10/17/2021 6:10:28 PM By: Deon Pilling RN, BSN Entered By: Deon Pilling on 10/17/2021 14:20:17 -------------------------------------------------------------------------------- Pain Assessment Details Patient Name: Date of Service: [name] 10/17/2021 1:30 PM Medical Record Number: [id number] Patient Account Number: [id number] Date of Birth/Sex: Treating RN: 04-24-1956 (65 y.o. Elam Dutch Primary Care Amelia Burgard: Roma Schanz Other Clinician: Referring Sarahelizabeth Conway: Treating Galo Sayed/Extender: Jackelyn Knife in  Treatment: 1 Active Problems Location of Pain Severity and Description of Pain Patient Has Paino No Site Locations Pain Management and Medication Current Pain Management: Electronic Signature(s) Signed: 10/17/2021 6:08:54 PM By: Baruch Gouty RN, BSN Signed: 10/18/2021 9:32:36 AM By: Sandre Kitty Entered By: Sandre Kitty on 10/17/2021 13:55:47 -------------------------------------------------------------------------------- Patient/Caregiver Education Details Patient Name: Date of Service: Debra [name] 12/21/2022andnbsp1:30 PM Medical Record Number: [id number] Patient Account Number: [id number] Date of Birth/Gender: Treating RN: 09/26/1956 (65 y.o. [name] Primary Care Physician: Roma Schanz Other Clinician: Referring Physician: Treating Physician/Extender: Jackelyn Knife in Treatment: 1 Education Assessment Education Provided To: Patient Education Topics Provided Wound/Skin Impairment: Handouts: Skin Care Do's and Dont's Methods: Explain/Verbal Responses: Reinforcements needed Electronic Signature(s) Signed: 10/17/2021 6:10:28 PM By: Deon Pilling RN, BSN Entered By: Deon Pilling on 10/17/2021  14:02:29 -------------------------------------------------------------------------------- Wound Assessment Details Patient Name: Date of Service: [name] 10/17/2021 1:30 PM Medical Record Number: [id number] Patient Account Number: [id number] Date of Birth/Sex: Treating RN: 1956/06/21 (65 y.o. Helene Shoe, Meta.Reding Primary Care Quinterrius Errington: Roma Schanz Other Clinician: Referring Darriona Dehaas: Treating Ravan Schlemmer/Extender: Jackelyn Knife in Treatment: 1 Wound Status Wound Number: 1 Primary Trauma, Other Etiology: Wound Location: Right, Posterior Foot Wound Healed - Epithelialized Wounding Event: Trauma Status: Date Acquired: 09/24/2021 Comorbid Anemia, Coronary Artery Disease, Hypertension, Type II Weeks Of Treatment: 1 History: Diabetes, Osteoarthritis, Neuropathy Clustered Wound: No Photos Wound Measurements Length: (cm) Width: (cm) Depth: (cm) Area: (cm) Volume: (cm) 0 % Reduction in Area: 100% 0 % Reduction in Volume: 100% 0 Epithelialization: Large (67-100%) 0 Tunneling: No 0 Undermining: No Wound Description Classification: Full Thickness Without Exposed Support Structures Wound Margin: Distinct, outline attached Exudate Amount: Medium Exudate Type: Serosanguineous Exudate Color: red, brown Foul Odor After Cleansing: No Slough/Fibrino No Wound Bed Granulation Amount: None Present (0%) Exposed Structure Necrotic Amount: None Present (0%) Fascia Exposed: No Fat Layer (Subcutaneous Tissue) Exposed: Yes Tendon Exposed: No Muscle Exposed: No Joint Exposed: No Bone Exposed: No Electronic Signature(s) Signed: 10/17/2021 6:10:28 PM By: Deon Pilling RN, BSN Entered By: Deon Pilling on 10/17/2021 14:19:30 -------------------------------------------------------------------------------- Vitals Details Patient Name: Date of Service: Debra [name], [place] NICA R. 10/17/2021 1:30 PM Medical Record Number: [id number] Patient Account  Number: [id number] Date of Birth/Sex: Treating RN: 1956-07-07 (65 y.o. Elam Dutch Primary Care Enriqueta Augusta: Roma Schanz Other Clinician: Referring Fayola Meckes: Treating Pattiann Solanki/Extender: Jackelyn Knife in Treatment: 1 Vital Signs Time Taken: 13:55 Temperature (F): 98.3 Height (in): 63 Pulse (bpm): 72 Weight (lbs): 172 Respiratory Rate (breaths/min): 16 Body Mass Index (BMI): 30.5 Blood Pressure (mmHg): 102/68 Reference Range: 80 - 120 mg / dl Electronic Signature(s) Signed: 10/18/2021 9:32:36 AM By: Sandre Kitty Entered By: Sandre Kitty on 10/17/2021 13:55:38

## 2021-11-20 ENCOUNTER — Other Ambulatory Visit: Payer: Self-pay | Admitting: Family Medicine

## 2021-11-20 DIAGNOSIS — E1165 Type 2 diabetes mellitus with hyperglycemia: Secondary | ICD-10-CM

## 2021-12-02 ENCOUNTER — Other Ambulatory Visit: Payer: Self-pay | Admitting: Family Medicine

## 2021-12-02 DIAGNOSIS — E039 Hypothyroidism, unspecified: Secondary | ICD-10-CM

## 2021-12-20 ENCOUNTER — Ambulatory Visit (INDEPENDENT_AMBULATORY_CARE_PROVIDER_SITE_OTHER): Payer: Medicare Other | Admitting: Orthopedic Surgery

## 2021-12-20 ENCOUNTER — Other Ambulatory Visit: Payer: Self-pay

## 2021-12-20 DIAGNOSIS — M65311 Trigger thumb, right thumb: Secondary | ICD-10-CM

## 2021-12-20 MED ORDER — BETAMETHASONE SOD PHOS & ACET 6 (3-3) MG/ML IJ SUSP
6.0000 mg | INTRAMUSCULAR | Status: AC | PRN
  Administered 2021-12-20: 6 mg via INTRA_ARTICULAR

## 2021-12-20 MED ORDER — LIDOCAINE HCL 1 % IJ SOLN
1.0000 mL | INTRAMUSCULAR | Status: AC | PRN
  Administered 2021-12-20: 1 mL

## 2021-12-20 NOTE — Progress Notes (Signed)
Office Visit Note   Patient: Debra [name]           Date of Birth: 1955/11/18           MRN: [id number] Visit Date: 12/20/2021              Requested by: [address] [place], [place] [address] [place],  [place] [address] PCP: Carollee Herter, Alferd Apa, DO   Assessment & Plan: Visit Diagnoses:  1. Trigger thumb, right thumb     Plan: Patient was last seen on 09/13/21 at which time she underwent CSI into the right thumb.  She still has symptomatic triggering.  We again discussed repeat injection versus open A1 pulley release.  She is not interested in surgery at this point and would like to try one more injection.  We again discussed the risks of injection, including alterations in her blood sugar given that she has DM.  I will see her back in 2 months if she is still symptomatic and would like to discuss surgery.   Follow-Up Instructions: No follow-ups on file.   Orders:  No orders of the defined types were placed in this encounter.  No orders of the defined types were placed in this encounter.     Procedures: Hand/UE Inj: R thumb A1 for trigger finger on 12/20/2021 7:52 PM Indications: pain and therapeutic Details: 25 G needle, volar approach Medications: 1 mL lidocaine 1 %; 6 mg betamethasone acetate-betamethasone sodium phosphate 6 (3-3) MG/ML Outcome: tolerated well, no immediate complications Procedure, treatment alternatives, risks and benefits explained, specific risks discussed. Consent was given by the patient. Immediately prior to procedure a time out was called to verify the correct patient, procedure, equipment, support staff and site/side marked as required. Patient was prepped and draped in the usual sterile fashion.      Clinical Data: No additional findings.   Subjective: Chief Complaint  Patient presents with   Other     Follow up right trigger thumb    This is a 66 yo RHD F who presents with continued triggering of her right thumb.  She  was last seen on 09/13/21 at which time she underwent CSI into the right thumb.  She continues to have painful triggering.  This is quite bothersome and interferes with her daily activieis.    Review of Systems   Objective: Vital Signs: There were no vitals taken for this visit.  Physical Exam Constitutional:      Appearance: Normal appearance.  Cardiovascular:     Rate and Rhythm: Normal rate.     Pulses: Normal pulses.  Pulmonary:     Effort: Pulmonary effort is normal.  Skin:    General: Skin is warm and dry.     Capillary Refill: Capillary refill takes less than 2 seconds.  Neurological:     Mental Status: She is alert.    Right Hand Exam   Tenderness  Right hand tenderness location: TTP at A1 pulley of thumb with palpable nodule.  Other  Erythema: absent Sensation: normal Pulse: present  Comments:  Visible and palpable triggering of thumb.      Specialty Comments:  No specialty comments available.  Imaging: No results found.   PMFS History: Patient Active Problem List   Diagnosis Date Noted   Wound infection 10/04/2021   Wound of right ankle 09/27/2021   Trigger thumb, right thumb 09/13/2021   Pruritic dermatitis 05/25/2021   Vaginal discharge 05/25/2021   Balance  disorder 05/25/2021   Dizziness 05/25/2021   Osteoarthritis    Vitamin D deficiency    Urinary frequency 10/31/2020   Chronic pain of both shoulders 10/31/2020   Female pattern baldness 10/06/2020   Myalgia 10/06/2020   Urge incontinence of urine 10/06/2020   History of diabetes mellitus, type II 01/11/2020   Allergies 02/03/2019   Acute vaginitis 04/22/2018   Morbid obesity 04/22/2018   Generalized anxiety disorder 04/22/2018   Obstructive sleep apnea 01/30/2018   Major depressive disorder 01/30/2018   Pansinusitis 01/30/2018   Aortic atherosclerosis 10/10/2017   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   Hyperlipidemia 04/17/2017   Anemia, iron deficiency 04/17/2017   Atypical chest  pain 08/31/2014   Arm weakness 08/31/2014   Hearing loss 07/24/2012   Hypothyroidism 11/02/2010   Essential hypertension 08/31/2010   Hyperglycemia, fasting 00/93/8182   Diastolic dysfunction 99/37/1696   Back pain with radiculopathy 01/25/2010   Fatigue 01/25/2010   Tobacco abuse 12/01/2008   Restless legs syndrome 12/01/2008   Leg pain, bilateral 11/17/2008   Palpitations 02/12/2008   GERD (gastroesophageal reflux disease) 07/16/2007   Hiatal hernia 07/16/2007   Knee pain 07/16/2007   Low back pain 07/16/2007   Peripheral vertigo 04/30/2007   Insomnia 04/30/2007   Abdominal pain, right upper quadrant 04/30/2007   Past Medical History:  Diagnosis Date   Abdominal pain, right upper quadrant 04/30/2007   Allergies 02/03/2019   Allergy    Anemia, iron deficiency 04/17/2017   Aortic atherosclerosis 10/10/2017   Atypical chest pain 08/31/2014   Back pain with radiculopathy 01/25/2010   Check xray --- ? Cause of leg weakness   Chronic pain of both shoulders 10/31/2020   Constipation    DDD (degenerative disc disease)    Diabetes mellitus type II, uncontrolled 78/93/8101   Diastolic dysfunction 75/07/2584   Epilepsy    Childhood seziures; pt reports that she grew out of them and they have not occurred since childhood   Essential hypertension 08/31/2010   Poorly controlled will alter medications, encouraged DASH diet, minimize caffeine and obtain adequate sleep. Report concerning symptoms and follow up as directed and as needed [employer] metoprolol Edema may be c   Fatigue 01/25/2010   Generalized anxiety disorder 04/22/2018   Stable co'nt meds   GERD (gastroesophageal reflux disease) 07/16/2007   Hearing loss 07/24/2012   Refer to hearing clinic   Hiatal hernia 07/16/2007   Hyperglycemia, fasting 05/04/2010   Hyperlipidemia 04/17/2017   Encouraged heart healthy diet, increase exercise, avoid trans fats, consider a krill oil cap daily   Hypothyroidism 11/02/2010    con't meds; Lab Results Component Value TSH 1.44 08/12/2017   Insomnia 04/30/2007   Knee pain 07/16/2007   Leg pain, bilateral 11/17/2008   Low back pain 07/16/2007   Major depressive disorder 01/30/2018   con't meds F/u counselor   Mixed connective tissue disease    with Raynaud's   Obstructive sleep apnea 01/30/2018   F/u with pulm; May be cause of some of her memory/concentration problems   Osteoarthritis    Osteoporosis    Palpitations 02/12/2008   Pansinusitis 01/30/2018   Peripheral vertigo 04/30/2007   Resolved rto prn   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   Restless legs syndrome 12/01/2008   Stomach ulcer    Swallowing difficulty    Upper respiratory tract infection 12/14/2019   Urge incontinence of urine 10/06/2020   Vitamin D deficiency     Family History  Problem Relation Age of Onset   Breast cancer Mother  possibly 50's   Arthritis Mother        rheumatoid   Cancer Mother        breast   Heart disease Mother 23       MI   Hyperlipidemia Mother    Thyroid disease Mother    Depression Mother    Anxiety disorder Mother    Heart disease Father        MI   Hypertension Father    Stroke Father    Diabetes Sister    Hypertension Sister    Hyperlipidemia Sister    Memory loss Maternal Aunt    Colon cancer Maternal Grandfather    Esophageal cancer Neg Hx    Rectal cancer Neg Hx    Stomach cancer Neg Hx     Past Surgical History:  Procedure Laterality Date   CARPAL TUNNEL RELEASE     Bilateral   CERVICAL SPINE SURGERY     x3   LEG SURGERY     Right    WRIST FRACTURE SURGERY  10-02-15   Pt have surgery twice on the same wrist.   Social History   Occupational History   Occupation: Security  Tobacco Use   Smoking status: Every Day    Packs/day: 0.75    Years: 44.00    Pack years: 33.00    Types: Cigarettes   Smokeless tobacco: Never  Substance and Sexual Activity   Alcohol use: Yes    Comment: 2-3 drinks per week (beer/wine)   Drug use:  No   Sexual activity: Yes    Partners: Male    Birth control/protection: Post-menopausal

## 2021-12-27 ENCOUNTER — Ambulatory Visit: Payer: Medicare Other

## 2021-12-28 ENCOUNTER — Ambulatory Visit (INDEPENDENT_AMBULATORY_CARE_PROVIDER_SITE_OTHER): Payer: Medicare Other

## 2021-12-28 ENCOUNTER — Telehealth: Payer: Self-pay | Admitting: *Deleted

## 2021-12-28 DIAGNOSIS — Z0000 Encounter for general adult medical examination without abnormal findings: Secondary | ICD-10-CM | POA: Diagnosis not present

## 2021-12-28 DIAGNOSIS — E1165 Type 2 diabetes mellitus with hyperglycemia: Secondary | ICD-10-CM

## 2021-12-28 NOTE — Progress Notes (Signed)
Subjective:   Debra [name] is a 66 y.o. female who presents for an Initial Medicare Annual Wellness Visit.  I connected with  Debra [name] on 12/28/21 by a audio enabled telemedicine application and verified that I am speaking with the correct person using two identifiers.  Patient Location: Home  Provider Location: Office/Clinic  I discussed the limitations of evaluation and management by telemedicine. The patient expressed understanding and agreed to proceed.   Review of Systems           Objective:    Today's Vitals   12/28/21 1230  PainSc: 6    There is no height or weight on file to calculate BMI.  Advanced Directives 12/28/2021 08/31/2014  Does Patient Have a Medical Advance Directive? No No  Would patient like information on creating a medical advance directive? No - Patient declined Yes - Educational materials given    Current Medications (verified) Outpatient Encounter Medications as of 12/28/2021  Medication Sig   azelastine (ASTELIN) 0.1 % nasal spray Place 2 sprays into both nostrils 2 (two) times daily. Use in each nostril as directed   blood glucose meter kit and supplies KIT Dispense based on patient and insurance preference. Use up to four times daily as directed. (FOR ICD-9 250.00, 250.01).   doxycycline (VIBRA-TABS) 100 MG tablet Take 1 tablet (100 mg total) by mouth 2 (two) times daily.   ezetimibe (ZETIA) 10 MG tablet Take 1 tablet (10 mg total) by mouth at bedtime.   gabapentin (NEURONTIN) 600 MG tablet TAKE 1 TABLET BY MOUTH 3  TIMES DAILY   hydrochlorothiazide (HYDRODIURIL) 25 MG tablet TAKE 1 TABLET BY MOUTH  DAILY   levothyroxine (SYNTHROID) 50 MCG tablet TAKE 1 TABLET BY MOUTH  DAILY   losartan (COZAAR) 100 MG tablet TAKE 1 TABLET BY MOUTH ONCE DAILY   metFORMIN (GLUCOPHAGE) 500 MG tablet TAKE 1 TABLET BY MOUTH  DAILY WITH BREAKFAST   methocarbamol (ROBAXIN) 500 MG tablet Take 1 tablet (500 mg total) by mouth in the morning and at bedtime.    metoprolol succinate (TOPROL-XL) 100 MG 24 hr tablet Take 1 tablet (100 mg total) by mouth daily. Take with or immediately following a meal.   montelukast (SINGULAIR) 10 MG tablet Take 1 tablet (10 mg total) by mouth at bedtime.   NON FORMULARY [facility]  Scar Cream -  Verapamil 10%, Pentoxifylline 5% Apply 1-2 grams to affected area 3-4 times daily Qty. 120 gm 3 refills   PARoxetine (PAXIL) 30 MG tablet Take 1 tablet (30 mg total) by mouth daily.   pravastatin (PRAVACHOL) 40 MG tablet Take 1 tablet (40 mg total) by mouth daily.   solifenacin (VESICARE) 5 MG tablet Take 1 tablet (5 mg total) by mouth daily.   [DISCONTINUED] sulfamethoxazole-trimethoprim (BACTRIM DS) 800-160 MG tablet Take 1 tablet by mouth 2 (two) times daily.   aspirin EC 81 MG tablet Take 1 tablet (81 mg total) by mouth daily. Swallow whole. (Patient not taking: Reported on 07/12/2021)   Vitamin D, Ergocalciferol, (DRISDOL) 1.25 MG (50000 UT) CAPS capsule Take 1 capsule (50,000 Units total) by mouth every 7 (seven) days. (Patient not taking: Reported on 09/27/2021)   Facility-Administered Encounter Medications as of 12/28/2021  Medication   0.9 %  sodium chloride infusion    Allergies (verified) Penicillins and Codeine   History: Past Medical History:  Diagnosis Date   Abdominal pain, right upper quadrant 04/30/2007   Allergies 02/03/2019   Allergy    Anemia, iron  deficiency 04/17/2017   Aortic atherosclerosis 10/10/2017   Atypical chest pain 08/31/2014   Back pain with radiculopathy 01/25/2010   Check xray --- ? Cause of leg weakness   Chronic pain of both shoulders 10/31/2020   Constipation    DDD (degenerative disc disease)    Diabetes mellitus type II, uncontrolled 01/11/2020   Diastolic dysfunction 02/15/2010   Epilepsy    Childhood seziures; pt reports that she grew out of them and they have not occurred since childhood   Essential hypertension 08/31/2010   Poorly controlled will alter  medications, encouraged DASH diet, minimize caffeine and obtain adequate sleep. Report concerning symptoms and follow up as directed and as needed [employer] metoprolol Edema may be c   Fatigue 01/25/2010   Generalized anxiety disorder 04/22/2018   Stable co'nt meds   GERD (gastroesophageal reflux disease) 07/16/2007   Hearing loss 07/24/2012   Refer to hearing clinic   Hiatal hernia 07/16/2007   Hyperglycemia, fasting 05/04/2010   Hyperlipidemia 04/17/2017   Encouraged heart healthy diet, increase exercise, avoid trans fats, consider a krill oil cap daily   Hypothyroidism 11/02/2010   con't meds; Lab Results Component Value TSH 1.44 08/12/2017   Insomnia 04/30/2007   Knee pain 07/16/2007   Leg pain, bilateral 11/17/2008   Low back pain 07/16/2007   Major depressive disorder 01/30/2018   con't meds F/u counselor   Mixed connective tissue disease    with Raynaud's   Obstructive sleep apnea 01/30/2018   F/u with pulm; May be cause of some of her memory/concentration problems   Osteoarthritis    Osteoporosis    Palpitations 02/12/2008   Pansinusitis 01/30/2018   Peripheral vertigo 04/30/2007   Resolved rto prn   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   Family History  Problem Relation Age of Onset   Breast cancer Mother        possibly 30's   Arthritis Mother        rheumatoid   Cancer Mother        breast   Heart disease Mother 51       MI   Hyperlipidemia Mother    Thyroid disease Mother    Depression Mother    Anxiety disorder Mother    Heart disease  Father        MI   Hypertension Father    Stroke Father    Diabetes Sister    Hypertension Sister    Hyperlipidemia Sister    Memory loss Maternal Aunt    Colon cancer Maternal Grandfather    Esophageal cancer Neg Hx    Rectal cancer Neg Hx    Stomach cancer Neg Hx    Social History   Socioeconomic History   Marital status: Legally Separated    Spouse name: Not on file   Number of children: 3   Years of education: 14   Highest education level: Some college, no degree  Occupational History   Occupation: Office manager  Tobacco Use   Smoking status: Every Day    Packs/day: 0.75    Years: 44.00    Pack years: 33.00    Types: Cigarettes   Smokeless tobacco: Never  Substance and Sexual Activity   Alcohol use: Yes    Comment: 2-3 drinks per week (beer/wine)   Drug use: No   Sexual activity: Yes    Partners: Male    Birth control/protection: Post-menopausal  Other Topics Concern   Not on file  Social History Narrative   Not on file   Social Determinants of Health   Financial Resource Strain: Low Risk    Difficulty of Paying Living Expenses: Not hard at all  Food Insecurity: No Food Insecurity   Worried About Programme researcher, broadcasting/film/video in the Last Year: Never true   Ran Out of Food in the Last Year: Never true  Transportation Needs: No Transportation Needs   Lack of Transportation (Medical): No   Lack of Transportation (Non-Medical): No  Physical Activity: Insufficiently Active   Days of Exercise per Week: 3 days   Minutes of Exercise per Session: 30 min  Stress: No Stress Concern Present   Feeling of Stress : Only a little  Social Connections: Moderately Isolated   Frequency of Communication with Friends and Family: More than three times a week   Frequency of Social Gatherings with Friends and Family: Once a week   Attends Religious Services: More than 4 times per year   Active Member of [employer] or Organizations: No   Attends Engineer, structural: Never   Marital  Status: Never married    Tobacco Counseling Ready to quit: Not Answered Counseling given: Not Answered   Clinical Intake:  Pre-visit preparation completed: Yes  Pain : 0-10 Pain Score: 6  Pain Type: Chronic pain Pain Location: Arm Pain Orientation: Left, Right Pain Descriptors / Indicators: Sore Pain Onset: More than a month ago Pain Frequency: Constant     Diabetes: Yes CBG done?: No Did pt. bring in CBG monitor from home?: No  How often do you need to have someone help you when you read instructions, pamphlets, or other written materials from your doctor or pharmacy?: 1 - Never  Diabetic?yes Nutrition Risk Assessment:  Has the patient had any N/V/D within the last 2 months?  No  Does the patient have any non-healing wounds?  No  Has the patient had any unintentional weight loss or weight gain?  No   Diabetes:  Is the patient diabetic?  Yes  If diabetic, was a CBG obtained today?  No  Did the patient bring in their glucometer from home?  No  How often do you monitor your CBG's? Once daily.   Financial Strains and Diabetes Management:  Are you having any financial strains with the device, your supplies or your medication? No .  Does the patient want to be seen by Chronic Care Management for management of their diabetes?  Yes  Would the patient like to be referred to a Nutritionist or for Diabetic Management?  No   Diabetic Exams:  Diabetic Eye Exam: Overdue for diabetic eye exam. Pt has been advised about the importance in completing this exam. Patient advised to call and schedule an eye exam. Diabetic Foot Exam: Completed 04/20/21    Interpreter Needed?: No  Information entered by :: Honorio Devol   Activities of Daily Living In your present state of health, do you have any difficulty performing the following activities: 12/28/2021  Hearing? N  Vision? N  Difficulty concentrating or making decisions? N  Walking or climbing stairs? N  Dressing or  bathing? N  Doing errands, shopping? N  Preparing Food and eating ? N  Using the Toilet? N  In the past six months, have you accidently leaked urine? Y  Do you have problems with loss of bowel control? N  Managing your Medications? N  Managing your Finances? N  Housekeeping or managing your Housekeeping? N  Some recent data might be hidden    Patient Care Team: Carollee Herter, Alferd Apa, DO as PCP - General Buford Dresser, MD as PCP - Cardiology (Cardiology) Leeroy Cha, MD as Consulting Physician (Neurosurgery) Chesley Mires, MD as Consulting Physician (Pulmonary Disease)  Indicate any recent Medical Services you may have received from other than [facility] providers in the past year (date may be approximate).     Assessment:   This is a routine wellness examination for [place].  Hearing/Vision screen No results found.  Dietary issues and exercise activities discussed: Exercise limited by: None identified   Goals Addressed   None    Depression Screen PHQ 2/9 Scores 12/28/2021 09/21/2018 09/09/2018 08/19/2018 07/22/2018 10/03/2017 08/12/2017  PHQ - 2 Score $Remov'3 1 1 6 3 4 'uOlcTI$ -  PHQ- 9 Score $Remov'15 13 12 21 14 11 'ebYIIu$ -  Exception Documentation - - - - - - Other- indicate reason in comment box  Not completed - - - - - - Pt came in crying today stating there's a lot going on but wouldn't ellaborate any further    Fall Risk Fall Risk  12/28/2021 02/27/2021 10/05/2020 08/12/2017 03/12/2016  Falls in the past year? $RemoveBe'1 1 1 'YaJomDAoE$ No Yes  Number falls in past yr: 0 1 0 - 1  Injury with Fall? 0 0 0 - Yes  Risk for fall due to : History of fall(s) History of fall(s) - - -  Follow up Falls evaluation completed Falls evaluation completed - - -    FALL RISK PREVENTION PERTAINING TO THE HOME:  Any stairs in or around the home? Yes  If so, are there any without handrails? No  Home free of loose throw rugs in walkways, pet beds, electrical cords, etc? Yes  Adequate lighting in your home to reduce risk of  falls? Yes   ASSISTIVE DEVICES UTILIZED TO PREVENT FALLS:  Life alert? No  Use of a cane, walker or w/c? No  Grab bars in the bathroom? Yes  Shower chair or bench in shower? Yes  Elevated toilet seat or a handicapped toilet? No   TIMED UP AND GO:  Was the test performed? No .    Cognitive Function: Normal cognitive status assessed by direct observation. No abnormalities found.          Immunizations Immunization History  Administered Date(s) Administered   Fluad Quad(high Dose 65+) 08/30/2021   Influenza Whole 10/01/2007, 08/31/2010   Influenza,inj,Quad PF,6+ Mos 10/12/2013, 08/29/2015, 08/13/2016, 08/12/2017, 08/03/2018   Influenza-Unspecified 08/10/2019   PFIZER Comirnaty(Gray Top)Covid-19 Tri-Sucrose Vaccine 02/27/2021   PFIZER(Purple Top)SARS-COV-2 Vaccination 01/11/2020, 01/31/2020, 08/24/2020   Pfizer Covid-19 Vaccine Bivalent Booster 64yrs & up 08/29/2021   Pneumococcal Polysaccharide-23 02/27/2021   Td 11/02/2010, 09/27/2021   Tdap 08/03/2018   Zoster Recombinat (Shingrix) 04/17/2017, 06/17/2017   Zoster, Live 03/12/2016    TDAP status: Up to date  Flu Vaccine status: Up to date  Pneumococcal vaccine status: Up to date  Covid-19 vaccine status: Completed vaccines  Qualifies for Shingles Vaccine? Yes   Zostavax completed No  Shingrix Completed?: Yes  Screening Tests Health Maintenance  Topic Date Due   OPHTHALMOLOGY EXAM  05/14/2019   Pneumonia Vaccine 54+ Years old (2 - PCV) 02/27/2022   HEMOGLOBIN A1C  02/27/2022   FOOT EXAM  04/20/2022   MAMMOGRAM  06/19/2022   DEXA SCAN  06/20/2023   COLONOSCOPY (Pts 45-44yrs Insurance coverage will need to be confirmed)  07/27/2031   TETANUS/TDAP  09/28/2031   INFLUENZA VACCINE  Completed   COVID-19 Vaccine  Completed   Hepatitis C Screening  Completed   Zoster Vaccines- Shingrix  Completed   HPV VACCINES  Aged Out    Health Maintenance  Health Maintenance Due  Topic Date Due   OPHTHALMOLOGY EXAM   05/14/2019   Pneumonia Vaccine 87+ Years old (2 - PCV) 02/27/2022    Colorectal cancer screening: Type of screening: Colonoscopy. Completed 07/26/21. Repeat every 10 years  Mammogram status: Completed 06/19/21. Repeat every year  Bone Density status: Completed 06/19/21. Results reflect: Bone density results: NORMAL. Repeat every 2 years.  Lung Cancer Screening: (Low Dose CT Chest recommended if Age 53-80 years, 30 pack-year currently smoking OR have quit w/in 15years.) does not qualify.   Lung Cancer Screening Referral: N/A  Additional Screening:  Hepatitis C Screening: does not qualify; Completed 08/29/2015  Vision Screening: Recommended annual ophthalmology exams for early detection of glaucoma and other disorders of the eye. Is the patient up to date with their annual eye exam?  No  Who is the provider or what is the name of the office in which the patient attends annual eye exams? N/A If pt is not established with a provider, would they like to be referred to a provider to establish care? No .   Dental Screening: Recommended annual dental exams for proper oral hygiene  Community Resource Referral / Chronic Care Management: CRR required this visit?  No   CCM required this visit?  Yes     Plan:     I have personally reviewed and noted the following in the patients chart:   Medical and social history Use of alcohol, tobacco or illicit drugs  Current medications and supplements including opioid prescriptions. Patient is not currently taking opioid prescriptions. Functional ability and status Nutritional status Physical activity Advanced directives List of other physicians Hospitalizations, surgeries, and ER visits in previous 12 months Vitals Screenings to include cognitive, depression, and falls Referrals and appointments  In addition, I have reviewed and discussed with patient certain preventive protocols, quality metrics, and best practice recommendations. A written  personalized care plan for preventive services as well as general preventive health recommendations were provided to patient.   Due to this being a telephonic visit, the after visit summary with patients personalized plan was offered to patient via mail or my-chart. Patient would like to access on my-chart  Nicholaus Corolla, [place]   12/28/2021   Nurse Notes: None

## 2021-12-28 NOTE — Chronic Care Management (AMB) (Signed)
?  Chronic Care Management  ? ?Outreach Note ? ?12/28/2021 ?Name: Debra [name] MRN: [id number] DOB: 1956/01/07 ? ?Debra [name] is a 66 y.o. year old female who is a primary care patient of Ann Held, DO. I reached out to [name] by phone today in response to a referral sent by Ms. Jill Side Stretch's primary care provider. ? ?An unsuccessful telephone outreach was attempted today. The patient was referred to the case management team for assistance with care management and care coordination.  ? ?Follow Up Plan: A HIPAA compliant phone message was left for the patient providing contact information and requesting a return call.  ? ?Haig Gerardo, CCMA ?Care Guide, Embedded Care Coordination ?[facility]  ?Direct Dial: [phone] ? ? ?

## 2021-12-28 NOTE — Patient Instructions (Signed)
Debra [name] , Thank you for taking time to come for your Medicare Wellness Visit. I appreciate your ongoing commitment to your health goals. Please review the following plan we discussed and let me know if I can assist you in the future.   Screening recommendations/referrals: Colonoscopy: 07/26/21 due 07/27/31 Mammogram: 06/19/21 due 06/19/22 Bone Density: 06/19/21 due 06/20/23 Recommended yearly ophthalmology/optometry visit for glaucoma screening and checkup Recommended yearly dental visit for hygiene and checkup  Vaccinations: Influenza vaccine: up to date Pneumococcal vaccine: up to date Tdap vaccine: up to date Shingles vaccine: up to date   Covid-19:completed  Advanced directives: No, copy will be mailed  Conditions/risks identified: see problem list  Next appointment: Follow up in one year for your annual wellness visit    Preventive Care 54 Years and Older, Female Preventive care refers to lifestyle choices and visits with your health care provider that can promote health and wellness. What does preventive care include? A yearly physical exam. This is also called an annual well check. Dental exams once or twice a year. Routine eye exams. Ask your health care provider how often you should have your eyes checked. Personal lifestyle choices, including: Daily care of your teeth and gums. Regular physical activity. Eating a healthy diet. Avoiding tobacco and drug use. Limiting alcohol use. Practicing safe sex. Taking low-dose aspirin every day. Taking vitamin and mineral supplements as recommended by your health care provider. What happens during an annual well check? The services and screenings done by your health care provider during your annual well check will depend on your age, overall health, lifestyle risk factors, and family history of disease. Counseling  Your health care provider may ask you questions about your: Alcohol use. Tobacco use. Drug use. Emotional  well-being. Home and relationship well-being. Sexual activity. Eating habits. History of falls. Memory and ability to understand (cognition). Work and work Statistician. Reproductive health. Screening  You may have the following tests or measurements: Height, weight, and BMI. Blood pressure. Lipid and cholesterol levels. These may be checked every 5 years, or more frequently if you are over 56 years old. Skin check. Lung cancer screening. You may have this screening every year starting at age 37 if you have a 30-pack-year history of smoking and currently smoke or have quit within the past 15 years. Fecal occult blood test (FOBT) of the stool. You may have this test every year starting at age 75. Flexible sigmoidoscopy or colonoscopy. You may have a sigmoidoscopy every 5 years or a colonoscopy every 10 years starting at age 67. Hepatitis C blood test. Hepatitis B blood test. Sexually transmitted disease (STD) testing. Diabetes screening. This is done by checking your blood sugar (glucose) after you have not eaten for a while (fasting). You may have this done every 1-3 years. Bone density scan. This is done to screen for osteoporosis. You may have this done starting at age 91. Mammogram. This may be done every 1-2 years. Talk to your health care provider about how often you should have regular mammograms. Talk with your health care provider about your test results, treatment options, and if necessary, the need for more tests. Vaccines  Your health care provider may recommend certain vaccines, such as: Influenza vaccine. This is recommended every year. Tetanus, diphtheria, and acellular pertussis (Tdap, Td) vaccine. You may need a Td booster every 10 years. Zoster vaccine. You may need this after age 49. Pneumococcal 13-valent conjugate (PCV13) vaccine. One dose is recommended after age 60. Pneumococcal polysaccharide (PPSV23)  vaccine. One dose is recommended after age 79. Talk to your  health care provider about which screenings and vaccines you need and how often you need them. This information is not intended to replace advice given to you by your health care provider. Make sure you discuss any questions you have with your health care provider. Document Released: 11/10/2015 Document Revised: 07/03/2016 Document Reviewed: 08/15/2015 Elsevier Interactive Patient Education  2017 [place] Prevention in the Home Falls can cause injuries. They can happen to people of all ages. There are many things you can do to make your home safe and to help prevent falls. What can I do on the outside of my home? Regularly fix the edges of walkways and driveways and fix any cracks. Remove anything that might make you trip as you walk through a door, such as a raised step or threshold. Trim any bushes or trees on the path to your home. Use bright outdoor lighting. Clear any walking paths of anything that might make someone trip, such as rocks or tools. Regularly check to see if handrails are loose or broken. Make sure that both sides of any steps have handrails. Any raised decks and porches should have guardrails on the edges. Have any leaves, snow, or ice cleared regularly. Use sand or salt on walking paths during winter. Clean up any spills in your garage right away. This includes oil or grease spills. What can I do in the bathroom? Use night lights. Install grab bars by the toilet and in the tub and shower. Do not use towel bars as grab bars. Use non-skid mats or decals in the tub or shower. If you need to sit down in the shower, use a plastic, non-slip stool. Keep the floor dry. Clean up any water that spills on the floor as soon as it happens. Remove soap buildup in the tub or shower regularly. Attach bath mats securely with double-sided non-slip rug tape. Do not have throw rugs and other things on the floor that can make you trip. What can I do in the bedroom? Use night  lights. Make sure that you have a light by your bed that is easy to reach. Do not use any sheets or blankets that are too big for your bed. They should not hang down onto the floor. Have a firm chair that has side arms. You can use this for support while you get dressed. Do not have throw rugs and other things on the floor that can make you trip. What can I do in the kitchen? Clean up any spills right away. Avoid walking on wet floors. Keep items that you use a lot in easy-to-reach places. If you need to reach something above you, use a strong step stool that has a grab bar. Keep electrical cords out of the way. Do not use floor polish or wax that makes floors slippery. If you must use wax, use non-skid floor wax. Do not have throw rugs and other things on the floor that can make you trip. What can I do with my stairs? Do not leave any items on the stairs. Make sure that there are handrails on both sides of the stairs and use them. Fix handrails that are broken or loose. Make sure that handrails are as long as the stairways. Check any carpeting to make sure that it is firmly attached to the stairs. Fix any carpet that is loose or worn. Avoid having throw rugs at the top or bottom  of the stairs. If you do have throw rugs, attach them to the floor with carpet tape. Make sure that you have a light switch at the top of the stairs and the bottom of the stairs. If you do not have them, ask someone to add them for you. What else can I do to help prevent falls? Wear shoes that: Do not have high heels. Have rubber bottoms. Are comfortable and fit you well. Are closed at the toe. Do not wear sandals. If you use a stepladder: Make sure that it is fully opened. Do not climb a closed stepladder. Make sure that both sides of the stepladder are locked into place. Ask someone to hold it for you, if possible. Clearly mark and make sure that you can see: Any grab bars or handrails. First and last  steps. Where the edge of each step is. Use tools that help you move around (mobility aids) if they are needed. These include: Canes. Walkers. Scooters. Crutches. Turn on the lights when you go into a dark area. Replace any light bulbs as soon as they burn out. Set up your furniture so you have a clear path. Avoid moving your furniture around. If any of your floors are uneven, fix them. If there are any pets around you, be aware of where they are. Review your medicines with your doctor. Some medicines can make you feel dizzy. This can increase your chance of falling. Ask your doctor what other things that you can do to help prevent falls. This information is not intended to replace advice given to you by your health care provider. Make sure you discuss any questions you have with your health care provider. Document Released: 08/10/2009 Document Revised: 03/21/2016 Document Reviewed: 11/18/2014 Elsevier Interactive Patient Education  2017 [employer].

## 2022-01-01 ENCOUNTER — Ambulatory Visit (INDEPENDENT_AMBULATORY_CARE_PROVIDER_SITE_OTHER): Payer: Medicare Other | Admitting: Family Medicine

## 2022-01-01 ENCOUNTER — Encounter: Payer: Self-pay | Admitting: Family Medicine

## 2022-01-01 VITALS — BP 138/78 | Ht 63.0 in | Wt 164.6 lb

## 2022-01-01 DIAGNOSIS — I10 Essential (primary) hypertension: Secondary | ICD-10-CM

## 2022-01-01 DIAGNOSIS — T7840XD Allergy, unspecified, subsequent encounter: Secondary | ICD-10-CM

## 2022-01-01 DIAGNOSIS — E1165 Type 2 diabetes mellitus with hyperglycemia: Secondary | ICD-10-CM

## 2022-01-01 DIAGNOSIS — R3589 Other polyuria: Secondary | ICD-10-CM | POA: Diagnosis not present

## 2022-01-01 DIAGNOSIS — E039 Hypothyroidism, unspecified: Secondary | ICD-10-CM

## 2022-01-01 DIAGNOSIS — E7800 Pure hypercholesterolemia, unspecified: Secondary | ICD-10-CM | POA: Diagnosis not present

## 2022-01-01 DIAGNOSIS — I700 Atherosclerosis of aorta: Secondary | ICD-10-CM

## 2022-01-01 DIAGNOSIS — F331 Major depressive disorder, recurrent, moderate: Secondary | ICD-10-CM

## 2022-01-01 DIAGNOSIS — N3946 Mixed incontinence: Secondary | ICD-10-CM | POA: Diagnosis not present

## 2022-01-01 DIAGNOSIS — N393 Stress incontinence (female) (male): Secondary | ICD-10-CM | POA: Insufficient documentation

## 2022-01-01 LAB — POC URINALSYSI DIPSTICK (AUTOMATED)
Bilirubin, UA: NEGATIVE
Blood, UA: NEGATIVE
Glucose, UA: NEGATIVE
Ketones, UA: NEGATIVE
Leukocytes, UA: NEGATIVE
Nitrite, UA: NEGATIVE
Protein, UA: NEGATIVE
Spec Grav, UA: <=1.005 — AB (ref 1.010–1.025)
Urobilinogen, UA: 0.2 E.U./dL
pH, UA: 6 (ref 5.0–8.0)

## 2022-01-01 LAB — GLUCOSE, POCT (MANUAL RESULT ENTRY): POC Glucose: 90 mg/dl (ref 70–99)

## 2022-01-01 MED ORDER — AZELASTINE HCL 0.1 % NA SOLN: 2.0000 | Freq: Two times a day (BID) | NASAL | 5 refills | Status: DC

## 2022-01-01 MED ORDER — OXYBUTYNIN CHLORIDE ER 10 MG PO TB24: 10.0000 mg | ORAL_TABLET | Freq: Every day | ORAL | 2 refills | Status: DC

## 2022-01-01 NOTE — Assessment & Plan Note (Signed)
Well controlled, no changes to meds. Encouraged heart healthy diet such as the DASH diet and exercise as tolerated.  °

## 2022-01-01 NOTE — Assessment & Plan Note (Signed)
Refer to gyn ?Glucose and ua normal  ?Ditropan sent in  ?Pt to call if this does not work  ?

## 2022-01-01 NOTE — Progress Notes (Signed)
Established Patient Office Visit  Subjective:  Patient ID: Debra [name], female    DOB: 1956-05-23  Age: 66 y.o. MRN: [id number]  CC:  Chief Complaint  Patient presents with   Polyuria    With gas - going on for 3-4 months     HPI Debra [name] presents for polyuria/ incontinence.   She never picked up the vesicare because it was too expensive.   No other new complaints   Past Medical History:  Diagnosis Date   Abdominal pain, right upper quadrant 04/30/2007   Allergies 02/03/2019   Allergy    Anemia, iron deficiency 04/17/2017   Aortic atherosclerosis 10/10/2017   Atypical chest pain 08/31/2014   Back pain with radiculopathy 01/25/2010   Check xray --- ? Cause of leg weakness   Chronic pain of both shoulders 10/31/2020   Constipation    DDD (degenerative disc disease)    Diabetes mellitus type II, uncontrolled 89/21/1941   Diastolic dysfunction 74/05/1447   Epilepsy    Childhood seziures; pt reports that she grew out of them and they have not occurred since childhood   Essential hypertension 08/31/2010   Poorly controlled will alter medications, encouraged DASH diet, minimize caffeine and obtain adequate sleep. Report concerning symptoms and follow up as directed and as needed [employer] metoprolol Edema may be c   Fatigue 01/25/2010   Generalized anxiety disorder 04/22/2018   Stable co'nt meds   GERD (gastroesophageal reflux disease) 07/16/2007   Hearing loss 07/24/2012   Refer to hearing clinic   Hiatal hernia 07/16/2007   Hyperglycemia, fasting 05/04/2010   Hyperlipidemia 04/17/2017   Encouraged heart healthy diet, increase exercise, avoid trans fats, consider a krill oil cap daily   Hypothyroidism 11/02/2010   con't meds; Lab Results Component Value TSH 1.44 08/12/2017   Insomnia 04/30/2007   Knee pain 07/16/2007   Leg pain, bilateral 11/17/2008   Low back pain 07/16/2007   Major depressive disorder 01/30/2018   con't meds F/u counselor   Mixed  connective tissue disease    with Raynaud's   Obstructive sleep apnea 01/30/2018   F/u with pulm; May be cause of some of her memory/concentration problems   Osteoarthritis    Osteoporosis    Palpitations 02/12/2008   Pansinusitis 01/30/2018   Peripheral vertigo 04/30/2007   Resolved rto prn   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    Family History  Problem Relation Age of Onset   Breast cancer Mother        possibly 87's   Arthritis Mother        rheumatoid   Cancer Mother        breast   Heart disease Mother 46       MI   Hyperlipidemia Mother    Thyroid disease Mother    Depression Mother    Anxiety disorder Mother    Heart disease Father        MI   Hypertension  Father    Stroke Father    Diabetes Sister    Hypertension Sister    Hyperlipidemia Sister    Memory loss Maternal Aunt    Colon cancer Maternal Grandfather    Esophageal cancer Neg Hx    Rectal cancer Neg Hx    Stomach cancer Neg Hx     Social History   Socioeconomic History   Marital status: Legally Separated    Spouse name: Not on file   Number of children: 3   Years of education: 14   Highest education level: Some college, no degree  Occupational History   Occupation: Security  Tobacco Use   Smoking status: Every Day    Packs/day: 0.75    Years: 44.00    Pack years: 33.00    Types: Cigarettes   Smokeless tobacco: Never  Substance and Sexual Activity   Alcohol use: Yes    Comment: 2-3 drinks per week (beer/wine)   Drug use: No   Sexual activity: Yes    Partners: Male     Birth control/protection: Post-menopausal  Other Topics Concern   Not on file  Social History Narrative   Not on file   Social Determinants of Health   Financial Resource Strain: Low Risk    Difficulty of Paying Living Expenses: Not hard at all  Food Insecurity: No Food Insecurity   Worried About Charity fundraiser in the Last Year: Never true   Ran Out of Food in the Last Year: Never true  Transportation Needs: No Transportation Needs   Lack of Transportation (Medical): No   Lack of Transportation (Non-Medical): No  Physical Activity: Insufficiently Active   Days of Exercise per Week: 3 days   Minutes of Exercise per Session: 30 min  Stress: No Stress Concern Present   Feeling of Stress : Only a little  Social Connections: Moderately Isolated   Frequency of Communication with Friends and Family: More than three times a week   Frequency of Social Gatherings with Friends and Family: Once a week   Attends Religious Services: More than 4 times per year   Active Member of [employer] or Organizations: No   Attends Archivist Meetings: Never   Marital Status: Never married  Human resources officer Violence: Not At Risk   Fear of Current or Ex-Partner: No   Emotionally Abused: No   Physically Abused: No   Sexually Abused: No    Outpatient Medications Prior to Visit  Medication Sig Dispense Refill   blood glucose meter kit and supplies KIT Dispense based on patient and insurance preference. Use up to four times daily as directed. (FOR ICD-9 250.00, 250.01). 1 each 0   gabapentin (NEURONTIN) 600 MG tablet TAKE 1 TABLET BY MOUTH 3  TIMES DAILY 270 tablet 3   hydrochlorothiazide (HYDRODIURIL) 25 MG tablet TAKE 1 TABLET BY MOUTH  DAILY 90 tablet 1   levothyroxine (SYNTHROID) 50 MCG tablet TAKE 1 TABLET BY MOUTH  DAILY 90 tablet 1   losartan (COZAAR) 100 MG tablet TAKE 1 TABLET BY MOUTH ONCE DAILY 90 tablet 1   metFORMIN (GLUCOPHAGE) 500 MG tablet TAKE 1 TABLET BY MOUTH  DAILY WITH  BREAKFAST 90 tablet 0   metoprolol succinate (TOPROL-XL) 100 MG 24 hr tablet Take 1 tablet (100 mg total) by mouth daily. Take with or immediately following a meal. 90 tablet 3   NON [place]  Scar Cream -  Verapamil 10%, Pentoxifylline 5% Apply 1-2 grams to affected area 3-4 times  daily Qty. 120 gm 3 refills     PARoxetine (PAXIL) 30 MG tablet Take 1 tablet (30 mg total) by mouth daily. 90 tablet 1   pravastatin (PRAVACHOL) 40 MG tablet Take 1 tablet (40 mg total) by mouth daily. 90 tablet 0   azelastine (ASTELIN) 0.1 % nasal spray Place 2 sprays into both nostrils 2 (two) times daily. Use in each nostril as directed 30 mL 5   Vitamin D, Ergocalciferol, (DRISDOL) 1.25 MG (50000 UT) CAPS capsule Take 1 capsule (50,000 Units total) by mouth every 7 (seven) days. 4 capsule 0   ezetimibe (ZETIA) 10 MG tablet Take 1 tablet (10 mg total) by mouth at bedtime. (Patient not taking: Reported on 01/01/2022) 30 tablet 2   methocarbamol (ROBAXIN) 500 MG tablet Take 1 tablet (500 mg total) by mouth in the morning and at bedtime. (Patient not taking: Reported on 01/01/2022) 60 tablet 3   montelukast (SINGULAIR) 10 MG tablet Take 1 tablet (10 mg total) by mouth at bedtime. (Patient not taking: Reported on 01/01/2022) 30 tablet 11   aspirin EC 81 MG tablet Take 1 tablet (81 mg total) by mouth daily. Swallow whole. (Patient not taking: Reported on 07/12/2021) 90 tablet 3   doxycycline (VIBRA-TABS) 100 MG tablet Take 1 tablet (100 mg total) by mouth 2 (two) times daily. (Patient not taking: Reported on 01/01/2022) 20 tablet 0   solifenacin (VESICARE) 5 MG tablet Take 1 tablet (5 mg total) by mouth daily. (Patient not taking: Reported on 01/01/2022) 30 tablet 2   Facility-Administered Medications Prior to Visit  Medication Dose Route Frequency Provider Last Rate Last Admin   0.9 %  sodium chloride infusion  500 mL Intravenous Once Ladene Artist, MD        Allergies  Allergen Reactions   Penicillins     Codeine Rash and Other (See Comments)    dizziness    ROS Review of Systems  Constitutional:  Negative for appetite change, diaphoresis, fatigue and unexpected weight change.  Eyes:  Negative for pain, redness and visual disturbance.  Respiratory:  Negative for cough, chest tightness, shortness of breath and wheezing.   Cardiovascular:  Negative for chest pain, palpitations and leg swelling.  Endocrine: Negative for cold intolerance, heat intolerance, polydipsia, polyphagia and polyuria.  Genitourinary:  Positive for frequency and urgency. Negative for difficulty urinating and dysuria.  Neurological:  Negative for dizziness, light-headedness, numbness and headaches.     Objective:    Physical Exam Vitals and nursing note reviewed.  Constitutional:      Appearance: She is well-developed.  HENT:     Head: Normocephalic and atraumatic.  Eyes:     Conjunctiva/sclera: Conjunctivae normal.  Neck:     Thyroid: No thyromegaly.     Vascular: No carotid bruit or JVD.  Cardiovascular:     Rate and Rhythm: Normal rate and regular rhythm.     Heart sounds: Normal heart sounds. No murmur heard. Pulmonary:     Effort: Pulmonary effort is normal. No respiratory distress.     Breath sounds: Normal breath sounds. No wheezing or rales.  Chest:     Chest wall: No tenderness.  Musculoskeletal:     Cervical back: Normal range of motion and neck supple.  Neurological:     Mental Status: She is alert and oriented to person, place, and time.    Ht $R'5\' 3"'pn$  (1.6 m)    Wt 164 lb 9.6 oz (74.7 kg)    BMI 29.16 kg/m  Wt  Readings from Last 3 Encounters:  01/01/22 164 lb 9.6 oz (74.7 kg)  10/04/21 172 lb 6.4 oz (78.2 kg)  09/27/21 174 lb 9.6 oz (79.2 kg)     Health Maintenance Due  Topic Date Due   OPHTHALMOLOGY EXAM  05/14/2019   Pneumonia Vaccine 68+ Years old (2 - PCV) 02/27/2022    There are no preventive care reminders to display for this patient.  Lab Results  Component Value Date    TSH 1.26 05/25/2021   Lab Results  Component Value Date   WBC 5.8 05/25/2021   HGB 11.6 (L) 05/25/2021   HCT 35.4 05/25/2021   MCV 84.5 05/25/2021   PLT 260 05/25/2021   Lab Results  Component Value Date   NA 138 08/30/2021   K 4.0 08/30/2021   CO2 30 08/30/2021   GLUCOSE 94 08/30/2021   BUN 17 08/30/2021   CREATININE 0.80 08/30/2021   BILITOT 0.5 08/30/2021   ALKPHOS 82 08/30/2021   AST 20 08/30/2021   ALT 10 08/30/2021   PROT 8.3 08/30/2021   ALBUMIN 4.3 08/30/2021   CALCIUM 10.0 08/30/2021   ANIONGAP 11 08/31/2014   GFR 76.95 08/30/2021   Lab Results  Component Value Date   CHOL 169 08/30/2021   Lab Results  Component Value Date   HDL 52.00 08/30/2021   Lab Results  Component Value Date   LDLCALC 100 (H) 08/30/2021   Lab Results  Component Value Date   TRIG 87.0 08/30/2021   Lab Results  Component Value Date   CHOLHDL 3 08/30/2021   Lab Results  Component Value Date   HGBA1C 7.1 (H) 08/30/2021      Assessment & Plan:   Problem List Items Addressed This Visit       Unprioritized   Allergies   Relevant Medications   azelastine (ASTELIN) 0.1 % nasal spray   Morbid obesity   Aortic atherosclerosis   Essential hypertension    Well controlled, no changes to meds. Encouraged heart healthy diet such as the DASH diet and exercise as tolerated.       Hyperlipidemia    Encourage heart healthy diet such as MIND or DASH diet, increase exercise, avoid trans fats, simple carbohydrates and processed foods, consider a krill or fish or flaxseed oil cap daily.       Hypothyroidism    Check labs       Relevant Orders   TSH   Mixed stress and urge urinary incontinence    Refer to gyn Glucose and ua normal  Ditropan sent in  Pt to call if this does not work       Relevant Medications   oxybutynin (DITROPAN-XL) 10 MG 24 hr tablet   Other Relevant Orders   Ambulatory referral to Obstetrics / Gynecology   Moderate episode of recurrent major  depressive disorder (HCC)   Polyuria - Primary   Relevant Medications   oxybutynin (DITROPAN-XL) 10 MG 24 hr tablet   Other Relevant Orders   POCT Glucose (CBG) (Completed)   Comprehensive metabolic panel   Hemoglobin A1c   Lipid panel   Microalbumin / creatinine urine ratio   POCT Urinalysis Dipstick (Automated) (Completed)   Other Visit Diagnoses     Type 2 diabetes mellitus with hyperglycemia, without long-term current use of insulin (HCC)       Relevant Orders   Comprehensive metabolic panel   Hemoglobin A1c   Lipid panel   Microalbumin / creatinine urine ratio  Meds ordered this encounter  Medications   oxybutynin (DITROPAN-XL) 10 MG 24 hr tablet    Sig: Take 1 tablet (10 mg total) by mouth at bedtime.    Dispense:  30 tablet    Refill:  2   azelastine (ASTELIN) 0.1 % nasal spray    Sig: Place 2 sprays into both nostrils 2 (two) times daily. Use in each nostril as directed    Dispense:  30 mL    Refill:  5    Follow-up: Return in about 6 months (around 07/04/2022), or if symptoms worsen or fail to improve, for annual exam, fasting.    Ann Held, DO

## 2022-01-01 NOTE — Assessment & Plan Note (Signed)
Check labs 

## 2022-01-01 NOTE — Patient Instructions (Signed)
Urinary Frequency, Adult ?Urinary frequency means urinating more often than usual. You may urinate every 1-2 hours even though you drink a normal amount of fluid and do not have a bladder infection or condition. Although you urinate more often than normal, the total amount of urine produced in a day is normal. ?With urinary frequency, you may have an urgent need to urinate often. The stress and anxiety of needing to find a bathroom quickly can make this urge worse. This condition may go away on its own, or you may need treatment at home. Home treatment may include bladder training, exercises, taking medicines, or making changes to your diet. ?Follow these instructions at home: ?Bladder health ?Your health care provider will tell you what to do to improve bladder health. You may be told to: ?Keep a bladder diary. Keep track of: ?What you eat and drink. ?How often you urinate. ?How much you urinate. ?Follow a bladder training program. This may include: ?Learning to delay going to the bathroom. ?Double urinating, also called voiding. This helps if you are not completely emptying your bladder. ?Scheduled voiding. ?Do Kegel exercises. Kegel exercises strengthen the muscles that help control urination, which may help the condition. ? ?Eating and drinking ?Follow instructions from your health care provider about eating or drinking restrictions. You may be told to: ?Avoid caffeine. ?Drink fewer fluids, especially alcohol. ?Avoid drinking in the evening. ?Avoid foods or drinks that may irritate the bladder. These include coffee, tea, soda, artificial sweeteners, citrus, tomato-based foods, and chocolate. ?Eat foods that help prevent or treat constipation. Constipation can make urinary frequency worse. You may need to take these actions to prevent or treat constipation: ?Drink enough fluid to keep your urine pale yellow. ?Take over-the-counter or prescription medicines. ?Eat foods that are high in fiber, such as beans, whole  grains, and fresh fruits and vegetables. ?Limit foods that are high in fat and processed sugars, such as fried or sweet foods. ?General instructions ?Take over-the-counter and prescription medicines only as told by your health care provider. ?Keep all follow-up visits. This is important. ?Contact a health care provider if: ?You start urinating more often. ?You feel pain or irritation when you urinate. ?You notice blood in your urine. ?Your urine looks cloudy. ?You develop a fever. ?You begin vomiting. ?Get help right away if: ?You are unable to urinate. ?Summary ?Urinary frequency means urinating more often than usual. With urinary frequency, you may urinate every 1-2 hours even though you drink a normal amount of fluid and do not have a bladder infection or other bladder condition. ?Your health care provider may recommend that you keep a bladder diary, follow a bladder training program, or make dietary changes. ?If told by your health care provider, do Kegel exercises to strengthen the muscles that help control urination. ?Take over-the-counter and prescription medicines only as told by your health care provider. ?Contact a health care provider if your symptoms do not improve or get worse. ?This information is not intended to replace advice given to you by your health care provider. Make sure you discuss any questions you have with your health care provider. ?Document Revised: 05/19/2020 Document Reviewed: 05/19/2020 ?Elsevier Patient Education ? [place]. ? ?

## 2022-01-01 NOTE — Assessment & Plan Note (Signed)
Encourage heart healthy diet such as MIND or DASH diet, increase exercise, avoid trans fats, simple carbohydrates and processed foods, consider a krill or fish or flaxseed oil cap daily.  °

## 2022-01-02 LAB — COMPREHENSIVE METABOLIC PANEL
ALT: 12 U/L (ref 0–35)
AST: 20 U/L (ref 0–37)
Albumin: 4 g/dL (ref 3.5–5.2)
Alkaline Phosphatase: 89 U/L (ref 39–117)
BUN: 16 mg/dL (ref 6–23)
CO2: 29 mEq/L (ref 19–32)
Calcium: 9.8 mg/dL (ref 8.4–10.5)
Chloride: 102 mEq/L (ref 96–112)
Creatinine, Ser: 0.78 mg/dL (ref 0.40–1.20)
GFR: 79.14 mL/min (ref >60.00)
Glucose, Bld: 77 mg/dL (ref 70–99)
Potassium: 4.1 mEq/L (ref 3.5–5.1)
Sodium: 138 mEq/L (ref 135–145)
Total Bilirubin: 0.4 mg/dL (ref 0.2–1.2)
Total Protein: 7.5 g/dL (ref 6.0–8.3)

## 2022-01-02 LAB — MICROALBUMIN / CREATININE URINE RATIO
Creatinine,U: 31.6 mg/dL
Microalb Creat Ratio: 2.2 mg/g (ref 0.0–30.0)
Microalb, Ur: <0.7 mg/dL (ref 0.0–1.9)

## 2022-01-02 LAB — LIPID PANEL
Cholesterol: 183 mg/dL (ref 0–200)
HDL: 51.8 mg/dL (ref >39.00)
LDL Cholesterol: 94 mg/dL (ref 0–99)
NonHDL: 131.31
Total CHOL/HDL Ratio: 4
Triglycerides: 185 mg/dL — ABNORMAL HIGH (ref 0.0–149.0)
VLDL: 37 mg/dL (ref 0.0–40.0)

## 2022-01-02 LAB — TSH: TSH: 1.32 u[IU]/mL (ref 0.35–5.50)

## 2022-01-02 LAB — HEMOGLOBIN A1C: Hgb A1c MFr Bld: 6.8 % — ABNORMAL HIGH (ref 4.6–6.5)

## 2022-01-11 NOTE — Chronic Care Management (AMB) (Signed)
?  Chronic Care Management  ? ?Outreach Note ? ?01/11/2022 ?Name: Debra [name] MRN: [id number] DOB: 02-15-1956 ? ?Debra [name] is a 67 y.o. year old female who is a primary care patient of Ann Held, DO. I reached out to [name] by phone today in response to a referral sent by Ms. [name]'s primary care provider. ? ?A second unsuccessful telephone outreach was attempted today. The patient was referred to the case management team for assistance with care management and care coordination.  ? ?Follow Up Plan: A HIPAA compliant phone message was left for the patient providing contact information and requesting a return call.  ? ?Voncile Schwarz, CCMA ?Care Guide, Embedded Care Coordination ?[facility]  ?Direct Dial: [phone] ? ? ?

## 2022-01-16 NOTE — Chronic Care Management (AMB) (Signed)
?  Chronic Care Management  ? ?Note ? ?01/16/2022 ?Name: Debra [name] MRN: [id number] DOB: 1956-08-11 ? ?Debra [name] is a 65 y.o. year old female who is a primary care patient of Ann Held, DO. I reached out to [name] by phone today in response to a referral sent by Debra [name]'s PCP. ? ?Debra [name] was given information about Chronic Care Management services today including:  ?CCM service includes personalized support from designated clinical staff supervised by her physician, including individualized plan of care and coordination with other care providers ?24/7 contact phone numbers for assistance for urgent and routine care needs. ?Service will only be billed when office clinical staff spend 20 minutes or more in a month to coordinate care. ?Only one practitioner may furnish and bill the service in a calendar month. ?The patient may stop CCM services at any time (effective at the end of the month) by phone call to the office staff. ?The patient is responsible for co-pay (up to 20% after annual deductible is met) if co-pay is required by the individual health plan.  ? ?Patient agreed to services and verbal consent obtained.  ? ?Follow up plan: ?Telephone appointment with care management team member scheduled for: 01/17/2022 ? ?Nancyjo Givhan, CCMA ?Care Guide, Embedded Care Coordination ?[facility]  ?Direct Dial: [phone] ? ? ?

## 2022-01-17 ENCOUNTER — Ambulatory Visit: Payer: Medicaid Other | Admitting: Pharmacist

## 2022-01-17 ENCOUNTER — Other Ambulatory Visit: Payer: Self-pay | Admitting: Family Medicine

## 2022-01-17 DIAGNOSIS — I700 Atherosclerosis of aorta: Secondary | ICD-10-CM

## 2022-01-17 DIAGNOSIS — F411 Generalized anxiety disorder: Secondary | ICD-10-CM

## 2022-01-17 DIAGNOSIS — E1169 Type 2 diabetes mellitus with other specified complication: Secondary | ICD-10-CM

## 2022-01-17 DIAGNOSIS — N3946 Mixed incontinence: Secondary | ICD-10-CM

## 2022-01-17 DIAGNOSIS — I10 Essential (primary) hypertension: Secondary | ICD-10-CM

## 2022-01-17 DIAGNOSIS — F331 Major depressive disorder, recurrent, moderate: Secondary | ICD-10-CM

## 2022-01-17 DIAGNOSIS — E1165 Type 2 diabetes mellitus with hyperglycemia: Secondary | ICD-10-CM

## 2022-01-17 MED ORDER — PRAVASTATIN SODIUM 40 MG PO TABS: 40.0000 mg | ORAL_TABLET | Freq: Every day | ORAL | 3 refills | Status: DC

## 2022-01-17 MED ORDER — PAROXETINE HCL 30 MG PO TABS: 30.0000 mg | ORAL_TABLET | Freq: Every day | ORAL | 1 refills | Status: DC

## 2022-01-17 MED ORDER — METFORMIN HCL 500 MG PO TABS: 500.0000 mg | ORAL_TABLET | Freq: Two times a day (BID) | ORAL | 3 refills | Status: DC

## 2022-01-17 NOTE — Patient Instructions (Addendum)
Debra [name] ?It was a pleasure speaking with you today.  ?I have attached a summary of our visit today and information about your health goals. (See below) ? ?Patient Goals/Self-Care Activities ?take medications as prescribed,  ?check glucose daily (at varying times of day), document, and provide at future appointments,  ?collaborate with provider on medication access solutions, and  ?engage in dietary modifications by limiting in take of high carbohydrate foods ?continue / restart pravastatin '40mg'$  for heart health ?continue current blood pressure regimen. Start metoprolol '100mg'$  daily when you complete current prescription of '50mg'$  twice a day ? ?If you have any questions or concerns, please feel free to contact me either at the phone number below or with a MyChart message.  ? ?Keep up the good work! ? ?[name], PharmD ?Clinical Pharmacist ?[facility] ?[facility] ?[phone] (direct line)  ?[phone] (main office number) ? ? ?Chronic Care Management Care Plan ?Diabetes: ?Controlled; A1c goal < 7.0% ?Lab Results  ?Component Value Date  ? HGBA1C 6.8 (H) 01/01/2022  ? ?Current treatment: ?Metformin '500mg'$  - take 1 tablet daily with breakfast.  ?Interventions:  ?Educated on diet and diabetes; Discussed which food increase blood glucose the most and about limiting frequency and serving sizes of these foods.  ?Recommended start checking blood glucose daily at varying times of day (both before and after meals)  ?Reviewed home blood glucose readings and reviewed goals  ?Fasting blood glucose goal (before meals) = 80 to 130 ?Blood glucose goal after a meal = less than 180  ?Sent in prescrition for glucometer and testing supplies. ?Reminded patient to schedule annual eye exam ? ?Hypertension: ?Controlled; blood pressure goal <140/90 ? ?BP Readings from Last 3 Encounters:  ?01/01/22 138/78  ?10/04/21 118/88  ?09/27/21 (!) 141/79  ? ?Current treatment: ?Hydrochlorothiazide '25mg'$  - take 1 tablet a  day ?Losartan '100mg'$  daily ?Metoprolol succinate '100mg'$  - take 1 tablet daily (patient is still using her old Rx for '50mg'$  tablets and take 1 tablet twice  a day) ?Interventions:  ?Recommended continue current blood pressure regimen. Patient will start metoprolol '100mg'$  daily when she completes current prescription of '50mg'$  twice a day. ? ?Hyperlipidemia: ?Uncontrolled; LDL goal is < 70 and triglycerides < 150 ? ?Lab Results  ?Component Value Date  ? CHOL 183 01/01/2022  ? HDL 51.80 01/01/2022  ? [place] 94 01/01/2022  ? LDLDIRECT 126.3 06/06/2011  ? TRIG 185.0 (H) 01/01/2022  ? CHOLHDL 4 01/01/2022  ? ?Current treatment: ?Pravastatin '40mg'$  daily   ?Interventions:  ?Recommended continue / restart pravastatin '40mg'$   ?Updated prescription for pravastatin at patient's pharmacy  ? ?Depression/Anxiety ?Controlled; Goal: decrease number of days per month with symptoms of depression or anxiety ?current treatment: ?Paroxetine '30mg'$  daily ?Interventions:  ?Updated prescription for paroxetine at pharmacy ? ?Stress / Urge Incontinence:  ?Current treatment:  ?oxybutynin '10mg'$  daily at bedtime ?Interventions: ?Will check with PCP about referral for pelvic floor physical therapy.  ? ?Medication management ?Pharmacist Clinical Goal(s): ?Over the next 90 days, patient will work with PharmD and providers to maintain optimal medication adherence ?Current pharmacy: [facility] ?Interventions ?Comprehensive medication review performed. ?Reviewed refill history and assessed adherence ?Continue current medication management strategy ?Updated prescriptions for metformin, pravastatin and paroxetine.  ? ?Patient Goals/Self-Care Activities ?take medications as prescribed,  ?check glucose daily (at varying times of day), document, and provide at future appointments,  ?collaborate with provider on medication access solutions, and  ?engage in dietary modifications by limiting in take of high carbohydrate foods ?continue /  restart pravastatin '40mg'$   for heart health ?continue current blood pressure regimen. Start metoprolol '100mg'$  daily when you complete current prescription of '50mg'$  twice a day ? ?Follow Up Plan: Telephone follow up appointment with care management team member scheduled for:  1 month ? ? ?Increase non-starchy vegetables - carrots, green bean, squash, zucchini, tomatoes, onions, peppers, spinach and other green leafy vegetables, cabbage, lettuce, cucumbers, asparagus, okra (not fried), eggplant ?Limit sugar and processed foods (cakes, cookies, ice cream, crackers and chips) ?Increase fresh fruit but limit serving sizes 1/2 cup or about the size of tennis or baseball ?Limit red meat to no more than 1-2 times per week (serving size about the size of your palm) ?Choose whole grains / lean proteins - whole wheat bread, quinoa, whole grain rice (1/2 cup), fish, chicken, [place] ?Avoid sugar and calorie containing beverages - soda, sweet tea and juice.  Choose water or unsweetened tea instead. ? ? ?  ? ? ? ? ?Never place loose needles and syringes in the trash or recycling!  ? ?-Label container ?Do Not Recycle.?  ?-Put sharps in point-first.  ?-Containers more than half-full should be disposed of.  ?-Store sharps in closed container with the cap screwed on. ?-Place in your GREEN trash can ONLY. ? ?Best Option ?Trash ?Put this item in your GREEN trash container. ?*This item is not recyclable in your brown cart at home.* ? ? ?The patient verbalized understanding of instructions, educational materials, and care plan provided today and agreed to receive a mailed copy of patient instructions, educational materials, and care plan.  ?

## 2022-01-21 NOTE — Chronic Care Management (AMB) (Signed)
? ? ?Chronic Care Management ?Pharmacy Note ? ?01/22/2022 ?Name:  Debra [name] MRN:  [id number] DOB:  May 09, 1956 ? ?Summary: ?Low adherence for paroxetine and pravastatin. Needed updated prescriptions for paroxetine, pravastatin and metformin.  ?Last A1c was at goal. Patient is only checking blood glucose a few times per week due to cost of testing supplies. She is using [employer] glucometer. Sent in prescription for glucometer and supplies that are preferred by [facility]. She will start checking blood glucose daily. Discussed blood glucose goals and provided education about diabetic type diet.   ?She also had questions about how to dispose of her lancets / sharps container. Provided local guidelines for sharps disposal.   ?Patient reported that oxybutynin is helping to control incontinence. She mentions physical therapy for incontinence. Checking with PCP to see if she would be a candidate for pelvic floor rehab.  ? ?Subjective: ?Debra [name] is an 66 y.o. year old female who is a primary patient of Ann Held, DO.  The CCM team was consulted for assistance with disease management and care coordination needs.   ? ?Engaged with patient by telephone for initial visit in response to provider referral for pharmacy case management and/or care coordination services.  ? ?Consent to Services:  ?The patient was given the following information about Chronic Care Management services today, agreed to services, and gave verbal consent: 1. CCM service includes personalized support from designated clinical staff supervised by the primary care provider, including individualized plan of care and coordination with other care providers 2. 24/7 contact phone numbers for assistance for urgent and routine care needs. 3. Service will only be billed when office clinical staff spend 20 minutes or more in a month to coordinate care. 4. Only one practitioner may furnish and bill the service in a calendar month. 5.The  patient may stop CCM services at any time (effective at the end of the month) by phone call to the office staff. 6. The patient will be responsible for cost sharing (co-pay) of up to 20% of the service fee (after annual deductible is met). Patient agreed to services and consent obtained. ? ?Patient Care Team: ?Carollee Herter, Alferd Apa, DO as PCP - General ?Buford Dresser, MD as PCP - Cardiology (Cardiology) ?Leeroy Cha, MD as Consulting Physician (Neurosurgery) ?Chesley Mires, MD as Consulting Physician (Pulmonary Disease) ?[name], RPH-CPP (Pharmacist) ? ?Recent office visits: ?01/01/2022 - PCP (Dr Carollee Herter) patient seen for follow up chronic conditions. Unable to afford vesicare / generic. Changed to oxybutynin $RemoveBefor'10mg'yBywaLsoDOCS$  at bedtime. Added azelastin 0.1% nasal spray - 2 sprays in both nostrils twice a day.  F/U 6 months.  ? ?Recent consult visits: ?12/20/2021 - Ortho (Dr Tempie Donning) Seen for right trigger thumb. Discussed repeat corticosteroid injection versus open A1 pulley release.  She is not interested in surgery at this point and would like to try one more injection. Received injection in right thumb. ?10/10/2021 - Wound Care ([facility]) seen for wound over the right ?posterior foot non healing since November 2022. Wound was debrided. Provided wound care instructions.  ? ?Hospital visits: ?None in previous 6 months ? ?Objective: ? ?Lab Results  ?Component Value Date  ? CREATININE 0.78 01/01/2022  ? CREATININE 0.80 08/30/2021  ? CREATININE 0.80 05/25/2021  ? ? ?Lab Results  ?Component Value Date  ? HGBA1C 6.8 (H) 01/01/2022  ? ?Last diabetic Eye exam:  ?Lab Results  ?Component Value Date/Time  ? HMDIABEYEEXA No Retinopathy 05/13/2018 12:00 AM  ?  ?Last  diabetic Foot exam: No results found for: HMDIABFOOTEX  ? ?   ?Component Value Date/Time  ? CHOL 183 01/01/2022 1752  ? CHOL 206 (H) 07/22/2018 1057  ? TRIG 185.0 (H) 01/01/2022 1752  ? HDL 51.80 01/01/2022 1752  ? HDL 48 07/22/2018 1057  ? CHOLHDL 4  01/01/2022 1752  ? VLDL 37.0 01/01/2022 1752  ? [place] 94 01/01/2022 1752  ? [place] 102 (H) 04/20/2021 1415  ? LDLDIRECT 126.3 06/06/2011 1533  ? ? ? ?  Latest Ref Rng & Units 01/01/2022  ?  5:52 PM 08/30/2021  ?  4:00 PM 05/25/2021  ?  2:33 PM  ?Hepatic Function  ?Total Protein 6.0 - 8.3 g/dL 7.5   8.3   7.8    ?Albumin 3.5 - 5.2 g/dL 4.0   4.3     ?AST 0 - 37 U/L $Remo'20   20   21    'cUanB$ ?ALT 0 - 35 U/L $Remo'12   10   14    'hxxVv$ ?Alk Phosphatase 39 - 117 U/L 89   82     ?Total Bilirubin 0.2 - 1.2 mg/dL 0.4   0.5   0.5    ? ? ?Lab Results  ?Component Value Date/Time  ? TSH 1.32 01/01/2022 05:52 PM  ? TSH 1.26 05/25/2021 02:33 PM  ? FREET4 0.55 (L) 10/09/2020 02:44 PM  ? FREET4 0.82 07/22/2018 10:57 AM  ? ? ? ?  Latest Ref Rng & Units 05/25/2021  ?  2:33 PM 11/24/2020  ?  2:31 PM 10/05/2020  ?  3:54 PM  ?CBC  ?WBC 3.8 - 10.8 Thousand/uL 5.8   6.4   5.2    ?Hemoglobin 11.7 - 15.5 g/dL 11.6   11.1   11.4    ?Hematocrit 35.0 - 45.0 % 35.4   33.5   34.3    ?Platelets 140 - 400 Thousand/uL 260   239   276.0    ? ? ?Lab Results  ?Component Value Date/Time  ? VD25OH 33.57 02/27/2021 02:18 PM  ? VD25OH 36.68 10/05/2020 03:54 PM  ? ? ?Clinical ASCVD: No  ?The 10-year ASCVD risk score (Arnett DK, et al., 2019) is: 41.7% ?  Values used to calculate the score: ?    Age: 41 years ?    Sex: Female ?    Is Non-Hispanic African American: Yes ?    Diabetic: Yes ?    Tobacco smoker: Yes ?    Systolic Blood Pressure: 818 mmHg ?    Is BP treated: Yes ?    HDL Cholesterol: 51.8 mg/dL ?    Total Cholesterol: 183 mg/dL   ? ? ? ?Social History  ? ?Tobacco Use  ?Smoking Status Every Day  ? Packs/day: 0.75  ? Years: 44.00  ? Pack years: 33.00  ? Types: Cigarettes  ?Smokeless Tobacco Never  ? ?BP Readings from Last 3 Encounters:  ?01/01/22 138/78  ?10/04/21 118/88  ?09/27/21 (!) 141/79  ? ?Pulse Readings from Last 3 Encounters:  ?10/04/21 69  ?09/27/21 79  ?09/13/21 72  ? ?Wt Readings from Last 3 Encounters:  ?01/01/22 164 lb 9.6 oz (74.7 kg)  ?10/04/21 172 lb 6.4  oz (78.2 kg)  ?09/27/21 174 lb 9.6 oz (79.2 kg)  ? ? ?Assessment: Review of patient past medical history, allergies, medications, health status, including review of consultants reports, laboratory and other test data, was performed as part of comprehensive evaluation and provision of chronic care management services.  ? ?SDOH:  (Social Determinants of Health) assessments  and interventions performed:  ? ? ?CCM Care Plan ? ?Allergies  ?Allergen Reactions  ? Penicillins   ? Codeine Rash and Other (See Comments)  ?  dizziness  ? ? ?Medications Reviewed Today   ? ? Reviewed by Carollee Herter, Alferd Apa, DO (Physician) on 01/01/22 at [place] List Status: <None>  ? ?Medication Order Taking? Sig Documenting Provider Last Dose Status Informant  ?0.9 %  sodium chloride infusion [id number]   Ladene Artist, MD  Active   ?Patient not taking:  Discontinued 01/01/22 1707   ?azelastine (ASTELIN) 0.1 % nasal spray [id number]  Place 2 sprays into both nostrils 2 (two) times daily. Use in each nostril as directed Ann Held, DO  Active   ?blood glucose meter kit and supplies KIT [id number] Yes Dispense based on patient and insurance preference. Use up to four times daily as directed. (FOR ICD-9 250.00, 250.01). Ann Held, DO Taking Active   ?Patient not taking:  Discontinued 01/01/22 1707   ?ezetimibe (ZETIA) 10 MG tablet [id number] No Take 1 tablet (10 mg total) by mouth at bedtime.  ?Patient not taking: Reported on 01/01/2022  ? Ann Held, DO Not Taking Active   ?gabapentin (NEURONTIN) 600 MG tablet [id number] Yes TAKE 1 TABLET BY MOUTH 3  TIMES DAILY Carollee Herter, Kendrick Fries R, DO Taking Active   ?hydrochlorothiazide (HYDRODIURIL) 25 MG tablet [id number] Yes TAKE 1 TABLET BY MOUTH  DAILY Roma Schanz R, DO Taking Active   ?levothyroxine (SYNTHROID) 50 MCG tablet [id number] Yes TAKE 1 TABLET BY MOUTH  DAILY Roma Schanz R, DO Taking Active   ?losartan (COZAAR) 100 MG tablet [id number] Yes TAKE 1  TABLET BY MOUTH ONCE DAILY Roma Schanz R, DO Taking Active   ?metFORMIN (GLUCOPHAGE) 500 MG tablet [id number] Yes TAKE 1 TABLET BY MOUTH  DAILY WITH BREAKFAST Ann Held, DO Taking Active

## 2022-01-22 MED ORDER — ACCU-CHEK GUIDE VI STRP
ORAL_STRIP | 3 refills | Status: DC
Start: 2022-01-22 — End: 2023-03-25

## 2022-01-22 MED ORDER — ACCU-CHEK GUIDE W/DEVICE KIT
PACK | 0 refills | Status: DC
Start: 2022-01-22 — End: 2024-01-06

## 2022-01-22 MED ORDER — ACCU-CHEK SOFTCLIX LANCETS MISC: 3 refills | Status: DC

## 2022-01-22 NOTE — Addendum Note (Signed)
Addended by: [name] B on: 01/22/2022 10:26 PM ? ? Modules accepted: Orders ? ?

## 2022-01-23 ENCOUNTER — Other Ambulatory Visit: Payer: Self-pay | Admitting: Family Medicine

## 2022-01-23 DIAGNOSIS — N3946 Mixed incontinence: Secondary | ICD-10-CM

## 2022-01-24 ENCOUNTER — Ambulatory Visit: Payer: Medicaid Other | Admitting: Physical Therapy

## 2022-02-06 ENCOUNTER — Encounter: Payer: Self-pay | Admitting: *Deleted

## 2022-02-06 DIAGNOSIS — E119 Type 2 diabetes mellitus without complications: Secondary | ICD-10-CM | POA: Diagnosis not present

## 2022-02-06 DIAGNOSIS — H524 Presbyopia: Secondary | ICD-10-CM | POA: Diagnosis not present

## 2022-02-06 DIAGNOSIS — H25813 Combined forms of age-related cataract, bilateral: Secondary | ICD-10-CM | POA: Diagnosis not present

## 2022-02-06 DIAGNOSIS — H35363 Drusen (degenerative) of macula, bilateral: Secondary | ICD-10-CM | POA: Diagnosis not present

## 2022-02-06 DIAGNOSIS — H04123 Dry eye syndrome of bilateral lacrimal glands: Secondary | ICD-10-CM | POA: Diagnosis not present

## 2022-02-06 LAB — HM DIABETES EYE EXAM

## 2022-02-10 ENCOUNTER — Other Ambulatory Visit: Payer: Self-pay | Admitting: Family Medicine

## 2022-02-10 DIAGNOSIS — G8929 Other chronic pain: Secondary | ICD-10-CM

## 2022-02-13 ENCOUNTER — Ambulatory Visit: Payer: Medicare Other | Attending: Family Medicine | Admitting: Physical Therapy

## 2022-02-13 ENCOUNTER — Encounter: Payer: Self-pay | Admitting: Physical Therapy

## 2022-02-13 DIAGNOSIS — N3946 Mixed incontinence: Secondary | ICD-10-CM | POA: Diagnosis present

## 2022-02-13 DIAGNOSIS — R279 Unspecified lack of coordination: Secondary | ICD-10-CM

## 2022-02-13 DIAGNOSIS — R293 Abnormal posture: Secondary | ICD-10-CM

## 2022-02-13 DIAGNOSIS — M6281 Muscle weakness (generalized): Secondary | ICD-10-CM
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
## 2022-02-13 NOTE — Patient Instructions (Signed)

## 2022-02-14 ENCOUNTER — Telehealth: Payer: Medicaid Other

## 2022-02-15 ENCOUNTER — Other Ambulatory Visit: Payer: Self-pay | Admitting: Family Medicine

## 2022-02-15 DIAGNOSIS — I10 Essential (primary) hypertension: Secondary | ICD-10-CM

## 2022-02-15 DIAGNOSIS — E1165 Type 2 diabetes mellitus with hyperglycemia: Secondary | ICD-10-CM

## 2022-02-19 ENCOUNTER — Ambulatory Visit (INDEPENDENT_AMBULATORY_CARE_PROVIDER_SITE_OTHER): Payer: Medicare Other | Admitting: Orthopedic Surgery

## 2022-02-19 DIAGNOSIS — M65311 Trigger thumb, right thumb: Secondary | ICD-10-CM

## 2022-02-19 DIAGNOSIS — M2420 Disorder of ligament, unspecified site: Secondary | ICD-10-CM | POA: Diagnosis not present

## 2022-02-19 NOTE — Progress Notes (Signed)
? ?Office Visit Note ?  ?Patient: Debra [name]           ?Date of Birth: [birth year]           ?MRN: [id number] ?Visit Date: 02/19/2022 ?             ?Requested by: Carollee Herter, Kendrick Fries R, DO ?[address] ?[address] ?[place],  [place] [address] ?PCP: Ann Held, DO ? ? ?Assessment & Plan: ?Visit Diagnoses:  ?1. Trigger thumb, right thumb   ?2. Snapping lateral band due to ligament laxity   ? ? ?Plan: Patient notes that her right thumb triggering has resolved following a second corticosteroid injection in February.  She does note that now she has triggering of her right index finger that was first noticed today.  This actually seems like pseudo-triggering with snapping of the ulnar lateral band over the proximal phalangeal condyle.  This is not painful for her.  She has full range of motion of the finger.  We discussed that if this were bothersome, we could discuss surgery which would involve suturing the radial lateral band to the central slip to prevent snapping.  This is not bothersome to her at the moment.  She would rather continue to observe her symptoms.  I can see her back as needed. ? ?Follow-Up Instructions: No follow-ups on file.  ? ?Orders:  ?No orders of the defined types were placed in this encounter. ? ?No orders of the defined types were placed in this encounter. ? ? ? ? Procedures: ?No procedures performed ? ? ?Clinical Data: ?No additional findings. ? ? ?Subjective: ?Chief Complaint  ?Patient presents with  ? Right Hand - Follow-up  ?  Thumb is better than it was-but still triggers some, now index finger is doing it, she is asking why its doing this-she thinks when she had Carpal tunnel release that they did something to it.   ? ? ?Is a 66 year old right-hand-dominant female presents for follow-up of a right trigger thumb as well as new onset triggering of the left index finger.  Her trigger thumb has been injected twice with the most recent injection being in February of this year.   Her trigger thumb has resolved now.  She no longer has any locking, catching, or discomfort at the thumb.  She does note that today she noticed her index finger triggering.  She notes a snapping sensation in the finger with flexion.  This is not symptomatic.  She has no pain in the area of the A1 pulley. ? ? ?Review of Systems ? ? ?Objective: ?Vital Signs: There were no vitals taken for this visit. ? ?Physical Exam ?Constitutional:   ?   Appearance: Normal appearance.  ?Cardiovascular:  ?   Rate and Rhythm: Normal rate.  ?   Pulses: Normal pulses.  ?Pulmonary:  ?   Effort: Pulmonary effort is normal.  ?Skin: ?   General: Skin is warm and dry.  ?   Capillary Refill: Capillary refill takes less than 2 seconds.  ?Neurological:  ?   Mental Status: She is alert.  ? ? ?Right Hand Exam  ? ?Tenderness  ?The patient is experiencing no tenderness.  ? ?Other  ?Erythema: absent ?Sensation: normal ?Pulse: present ? ?Comments:  No palpable or visible triggering of the thumb.  ? ? ?Left Hand Exam  ? ?Tenderness  ?The patient is experiencing no tenderness.  ? ?Other  ?Erythema: absent ?Sensation: normal ?Pulse: present ? ?Comments:  Audible and  palpable "click" with flexion of the index finger that resolves with flexion against resistance.  Subtle translation of the ulnar lateral band over the phalanx with flexion.  ? ? ? ? ?Specialty Comments:  ?No specialty comments available. ? ?Imaging: ?No results found. ? ? ?PMFS History: ?Patient Active Problem List  ? Diagnosis Date Noted  ? Snapping lateral band due to ligament laxity 02/19/2022  ? Moderate episode of recurrent major depressive disorder ([place]) 01/01/2022  ? Polyuria 01/01/2022  ? Mixed stress and urge urinary incontinence 01/01/2022  ? Wound infection 10/04/2021  ? Wound of right ankle 09/27/2021  ? Trigger thumb, right thumb 09/13/2021  ? Pruritic dermatitis 05/25/2021  ? Vaginal discharge 05/25/2021  ? Balance disorder 05/25/2021  ? Dizziness 05/25/2021  ? Osteoarthritis    ? Vitamin D deficiency   ? Urinary frequency 10/31/2020  ? Chronic pain of both shoulders 10/31/2020  ? Female pattern baldness 10/06/2020  ? Myalgia 10/06/2020  ? Urge incontinence of urine 10/06/2020  ? History of diabetes mellitus, type II 01/11/2020  ? Allergies 02/03/2019  ? Acute vaginitis 04/22/2018  ? Morbid obesity 04/22/2018  ? Generalized anxiety disorder 04/22/2018  ? Obstructive sleep apnea 01/30/2018  ? Major depressive disorder 01/30/2018  ? Pansinusitis 01/30/2018  ? Aortic atherosclerosis 10/10/2017  ? 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 Hyperlipidemia 04/17/2017  ? Anemia, iron deficiency 04/17/2017  ? Atypical chest pain 08/31/2014  ? Arm weakness 08/31/2014  ? Hearing loss 07/24/2012  ? Hypothyroidism 11/02/2010  ? Essential hypertension 08/31/2010  ? Hyperglycemia, fasting 05/04/2010  ? Diastolic dysfunction 81/85/6314  ? Back pain with radiculopathy 01/25/2010  ? Fatigue 01/25/2010  ? Tobacco abuse 12/01/2008  ? Restless legs syndrome 12/01/2008  ? Leg pain, bilateral 11/17/2008  ? Palpitations 02/12/2008  ? GERD (gastroesophageal reflux disease) 07/16/2007  ? Hiatal hernia 07/16/2007  ? Knee pain 07/16/2007  ? Low back pain 07/16/2007  ? Peripheral vertigo 04/30/2007  ? Insomnia 04/30/2007  ? Abdominal pain, right upper quadrant 04/30/2007  ? ?Past Medical History:  ?Diagnosis Date  ? Abdominal pain, right upper quadrant 04/30/2007  ? Allergies 02/03/2019  ? Allergy   ? Anemia, iron deficiency 04/17/2017  ? Aortic atherosclerosis 10/10/2017  ? Atypical chest pain 08/31/2014  ? Back pain with radiculopathy 01/25/2010  ? Check xray --- ? Cause of leg weakness  ? Chronic pain of both shoulders 10/31/2020  ? Constipation   ? DDD (degenerative disc disease)   ? Diabetes mellitus type II, uncontrolled 01/11/2020  ? Diastolic dysfunction 97/11/6376  ? Epilepsy   ? Childhood seziures; pt reports that she grew out of them and they have not occurred since childhood  ? Essential hypertension 08/31/2010  ?  Poorly controlled will alter medications, encouraged DASH diet, minimize caffeine and obtain adequate sleep. Report concerning symptoms and follow up as directed and as needed Start hctz Inc metoprolol Edema may be c  ? Fatigue 01/25/2010  ? Generalized anxiety disorder 04/22/2018  ? Stable co'nt meds  ? GERD (gastroesophageal reflux disease) 07/16/2007  ? Hearing loss 07/24/2012  ? Refer to hearing clinic  ? Hiatal hernia 07/16/2007  ? Hyperglycemia, fasting 05/04/2010  ? Hyperlipidemia 04/17/2017  ? Encouraged heart healthy diet, increase exercise, avoid trans fats, consider a krill oil cap daily  ? Hypothyroidism 11/02/2010  ? con't meds; Lab Results Component Value TSH 1.44 08/12/2017  ? Insomnia 04/30/2007  ? Knee pain 07/16/2007  ? Leg pain, bilateral 11/17/2008  ? Low back pain 07/16/2007  ? Major depressive  disorder 01/30/2018  ? con't meds F/u counselor  ? Mixed connective tissue disease   ? with Raynaud's  ? Obstructive sleep apnea 01/30/2018  ? F/u with pulm; May be cause of some of her memory/concentration problems  ? Osteoarthritis   ? Osteoporosis   ? Palpitations 02/12/2008  ? Pansinusitis 01/30/2018  ? Peripheral vertigo 04/30/2007  ? Resolved rto prn  ? 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 Restless legs syndrome 12/01/2008  ? Stomach ulcer   ? Swallowing difficulty   ? Upper respiratory tract infection 12/14/2019  ? Urge incontinence of urine 10/06/2020  ? Vitamin D deficiency   ?  ?Family History  ?Problem Relation Age of Onset  ? Breast cancer Mother   ?     possibly 38's  ? Arthritis Mother   ?     rheumatoid  ? Cancer Mother   ?     breast  ? Heart disease Mother 57  ?     MI  ? Hyperlipidemia Mother   ? Thyroid disease Mother   ? Depression Mother   ? Anxiety disorder Mother   ? Heart disease Father   ?     MI  ? Hypertension Father   ? Stroke Father   ? Diabetes Sister   ? Hypertension Sister   ? Hyperlipidemia Sister   ? Memory loss Maternal Aunt   ? Colon cancer Maternal Grandfather   ?  Esophageal cancer Neg Hx   ? Rectal cancer Neg Hx   ? Stomach cancer Neg Hx   ?  ?Past Surgical History:  ?Procedure Laterality Date  ? CARPAL TUNNEL RELEASE    ? Bilateral  ? CERVICAL SPINE SURGERY    ? x3

## 2022-02-25 ENCOUNTER — Other Ambulatory Visit: Payer: Self-pay | Admitting: Family Medicine

## 2022-02-25 DIAGNOSIS — E039 Hypothyroidism, unspecified: Secondary | ICD-10-CM

## 2022-02-27 ENCOUNTER — Ambulatory Visit (INDEPENDENT_AMBULATORY_CARE_PROVIDER_SITE_OTHER): Payer: Medicare Other | Admitting: Pharmacist

## 2022-02-27 DIAGNOSIS — E1165 Type 2 diabetes mellitus with hyperglycemia: Secondary | ICD-10-CM

## 2022-02-27 DIAGNOSIS — N3946 Mixed incontinence: Secondary | ICD-10-CM

## 2022-02-27 DIAGNOSIS — I10 Essential (primary) hypertension: Secondary | ICD-10-CM

## 2022-02-27 DIAGNOSIS — T7840XD Allergy, unspecified, subsequent encounter: Secondary | ICD-10-CM

## 2022-02-27 DIAGNOSIS — E1169 Type 2 diabetes mellitus with other specified complication: Secondary | ICD-10-CM

## 2022-02-27 MED ORDER — MONTELUKAST SODIUM 10 MG PO TABS: 10.0000 mg | ORAL_TABLET | Freq: Every day | ORAL | 1 refills | Status: DC

## 2022-02-27 NOTE — Patient Instructions (Signed)
Debra [name] ?It was a pleasure speaking with you today.  ?I have attached a summary of our visit today and information about your health goals. (See below) ? ?Patient Goals/Self-Care Activities ?take medications as prescribed,  ?check glucose daily (at varying times of day), document, and provide at future appointments,  ?collaborate with provider on medication access solutions, and  ?engage in dietary modifications by limiting in take of high carbohydrate foods ?continue pravastatin '40mg'$  for heart health ?continue current blood pressure regimen.  ? ?If you have any questions or concerns, please feel free to contact me either at the phone number below or with a MyChart message.  ? ?Keep up the good work! ? ?[name], PharmD ?Clinical Pharmacist ?[facility] ?[place] ?[phone] (direct line)  ?[phone] (main office number) ? ? ?Chronic Care Management Care Plan ?Diabetes: ?Controlled; A1c goal < 7.0% ?Lab Results  ?Component Value Date  ? HGBA1C 6.8 (H) 01/01/2022  ? ?Current treatment: ?Metformin '500mg'$  - take 1 tablet daily with breakfast.  ?Interventions:  ?Educated on diet and diabetes; Discussed which food increase blood glucose the most and about limiting frequency and serving sizes of these foods.  ?Recommended start checking blood glucose daily at varying times of day (both before and after meals)  ?Reviewed home blood glucose readings and reviewed goals  ?Fasting blood glucose goal (before meals) = 80 to 130 ?Blood glucose goal after a meal = less than 180  ?Sent in prescrition for glucometer and testing supplies. ?Reminded patient to schedule annual eye exam ? ?Hypertension: ?Controlled; blood pressure goal <140/90 ? ?BP Readings from Last 3 Encounters:  ?01/01/22 138/78  ?10/04/21 118/88  ?09/27/21 (!) 141/79  ? ?Current treatment: ?Hydrochlorothiazide '25mg'$  - take 1 tablet a day ?Losartan '100mg'$  daily ?Metoprolol succinate '100mg'$  - take 1 tablet daily ?Interventions:   ?Recommended continue current blood pressure regimen. ? ?Hyperlipidemia: ?Uncontrolled; LDL goal is < 70 and triglycerides < 150 ? ?Lab Results  ?Component Value Date  ? CHOL 183 01/01/2022  ? HDL 51.80 01/01/2022  ? [place] 94 01/01/2022  ? LDLDIRECT 126.3 06/06/2011  ? TRIG 185.0 (H) 01/01/2022  ? CHOLHDL 4 01/01/2022  ? ?Current treatment: ?Pravastatin '40mg'$  daily   ?Interventions:  ?Recommended continue / restart pravastatin '40mg'$   ?Updated prescription for pravastatin at patient's pharmacy  ? ?Depression/Anxiety ?Controlled; Goal: decrease number of days per month with symptoms of depression or anxiety ?current treatment: ?Paroxetine '30mg'$  daily ?Interventions:  ?Updated prescription for paroxetine at pharmacy ? ?Stress / Urge Incontinence:  ?Current treatment:  ?oxybutynin '10mg'$  daily at bedtime ?Interventions: ?Will check with PCP about referral for pelvic floor physical therapy.  ? ?Medication management ?Pharmacist Clinical Goal(s): ?Over the next 90 days, patient will work with PharmD and providers to maintain optimal medication adherence ?Current pharmacy: [facility] / [facility] ?Interventions ?Comprehensive medication review performed. ?Reviewed refill history and assessed adherence ?Continue current medication management strategy ?Updated prescriptions for montelukast  ? ?Patient Goals/Self-Care Activities ?take medications as prescribed,  ?check glucose daily (at varying times of day), document, and provide at future appointments,  ?collaborate with provider on medication access solutions, and  ?engage in dietary modifications by limiting in take of high carbohydrate foods ?Continue pravastatin '40mg'$  for heart health ?continue current blood pressure regimen.  ? ?Follow Up Plan: Telephone follow up appointment with care management team member scheduled for:  1 month ? ? ?Increase non-starchy vegetables - carrots, green bean, squash, zucchini, tomatoes, onions, peppers, spinach and other green leafy vegetables,  cabbage, lettuce,  cucumbers, asparagus, okra (not fried), eggplant ?Limit sugar and processed foods (cakes, cookies, ice cream, crackers and chips) ?Increase fresh fruit but limit serving sizes 1/2 cup or about the size of tennis or baseball ?Limit red meat to no more than 1-2 times per week (serving size about the size of your palm) ?Choose whole grains / lean proteins - whole wheat bread, quinoa, whole grain rice (1/2 cup), fish, chicken, [place] ?Avoid sugar and calorie containing beverages - soda, sweet tea and juice.  Choose water or unsweetened tea instead. ? ? ?  ?

## 2022-02-27 NOTE — Chronic Care Management (AMB) (Signed)
? ? ?Chronic Care Management ?Pharmacy Note ? ?02/27/2022 ?Name:  Debra [name] MRN:  [id number] DOB:  1955/12/04 ? ?Summary: ?Reviewed refill history and discussed recent medication administrion. Patient has been more adherent to medication regimen. She requests oxybutynin 10mg  and montelukast refills today. She has refills remaining for oxybutynin but needs an updated Rx for montelukast. Sent to [facility].  ?Last A1c was at goal. She has received nre glucometer and supplies but has not used yet. Walked patient thru testing blood glucose with new meter. Blood glucose was 116 today. Reviewed blood glucose goals.  ?She has had 1 visit to physical therapy for incontinence / pelvic floor rehab.   ? ?Subjective: ?Debra [name] is an 66 y.o. year old female who is a primary patient of Ann Held, DO.  The CCM team was consulted for assistance with disease management and care coordination needs.   ? ?Engaged with patient by telephone for follow up visit in response to provider referral for pharmacy case management and/or care coordination services.  ? ?Consent to Services:  ?The patient was given information about Chronic Care Management services, agreed to services, and gave verbal consent prior to initiation of services.  Please see initial visit note for detailed documentation.  ? ?Patient Care Team: ?Carollee Herter, Alferd Apa, DO as PCP - General ?Buford Dresser, MD as PCP - Cardiology (Cardiology) ?Leeroy Cha, MD as Consulting Physician (Neurosurgery) ?Chesley Mires, MD as Consulting Physician (Pulmonary Disease) ?[name], RPH-CPP (Pharmacist) ? ?Recent office visits: ?01/01/2022 - PCP (Dr Carollee Herter) patient seen for follow up chronic conditions. Unable to afford vesicare / generic. Changed to oxybutynin 10mg  at bedtime. Added azelastin 0.1% nasal spray - 2 sprays in both nostrils twice a day.  F/U 6 months.  ? ?Recent consult visits: ?12/20/2021 - Ortho (Dr Tempie Donning) Seen for right trigger  thumb. Discussed repeat corticosteroid injection versus open A1 pulley release.  She is not interested in surgery at this point and would like to try one more injection. Received injection in right thumb. ?10/10/2021 - Wound Care ([facility]) seen for wound over the right ?posterior foot non healing since November 2022. Wound was debrided. Provided wound care instructions.  ? ?Hospital visits: ?None in previous 6 months ? ?Objective: ? ?Lab Results  ?Component Value Date  ? CREATININE 0.78 01/01/2022  ? CREATININE 0.80 08/30/2021  ? CREATININE 0.80 05/25/2021  ? ? ?Lab Results  ?Component Value Date  ? HGBA1C 6.8 (H) 01/01/2022  ? ?Last diabetic Eye exam:  ?Lab Results  ?Component Value Date/Time  ? HMDIABEYEEXA No Retinopathy 02/06/2022 12:00 AM  ?  ?Last diabetic Foot exam: No results found for: HMDIABFOOTEX  ? ?   ?Component Value Date/Time  ? CHOL 183 01/01/2022 1752  ? CHOL 206 (H) 07/22/2018 1057  ? TRIG 185.0 (H) 01/01/2022 1752  ? HDL 51.80 01/01/2022 1752  ? HDL 48 07/22/2018 1057  ? CHOLHDL 4 01/01/2022 1752  ? VLDL 37.0 01/01/2022 1752  ? [place] 94 01/01/2022 1752  ? [place] 102 (H) 04/20/2021 1415  ? LDLDIRECT 126.3 06/06/2011 1533  ? ? ? ?  Latest Ref Rng & Units 01/01/2022  ?  5:52 PM 08/30/2021  ?  4:00 PM 05/25/2021  ?  2:33 PM  ?Hepatic Function  ?Total Protein 6.0 - 8.3 g/dL 7.5   8.3   7.8    ?Albumin 3.5 - 5.2 g/dL 4.0   4.3     ?AST 0 - 37 U/L 20   20  21    ?ALT 0 - 35 U/L $Remo'12   10   14    'LBShU$ ?Alk Phosphatase 39 - 117 U/L 89   82     ?Total Bilirubin 0.2 - 1.2 mg/dL 0.4   0.5   0.5    ? ? ?Lab Results  ?Component Value Date/Time  ? TSH 1.32 01/01/2022 05:52 PM  ? TSH 1.26 05/25/2021 02:33 PM  ? FREET4 0.55 (L) 10/09/2020 02:44 PM  ? FREET4 0.82 07/22/2018 10:57 AM  ? ? ? ?  Latest Ref Rng & Units 05/25/2021  ?  2:33 PM 11/24/2020  ?  2:31 PM 10/05/2020  ?  3:54 PM  ?CBC  ?WBC 3.8 - 10.8 Thousand/uL 5.8   6.4   5.2    ?Hemoglobin 11.7 - 15.5 g/dL 11.6   11.1   11.4    ?Hematocrit 35.0 - 45.0 % 35.4    33.5   34.3    ?Platelets 140 - 400 Thousand/uL 260   239   276.0    ? ? ?Lab Results  ?Component Value Date/Time  ? VD25OH 33.57 02/27/2021 02:18 PM  ? VD25OH 36.68 10/05/2020 03:54 PM  ? ? ?Clinical ASCVD: No  ?The 10-year ASCVD risk score (Arnett DK, et al., 2019) is: 41.7% ?  Values used to calculate the score: ?    Age: 66 years ?    Sex: Female ?    Is Non-Hispanic African American: Yes ?    Diabetic: Yes ?    Tobacco smoker: Yes ?    Systolic Blood Pressure: 465 mmHg ?    Is BP treated: Yes ?    HDL Cholesterol: 51.8 mg/dL ?    Total Cholesterol: 183 mg/dL   ? ? ? ?Social History  ? ?Tobacco Use  ?Smoking Status Every Day  ? Packs/day: 0.75  ? Years: 44.00  ? Pack years: 33.00  ? Types: Cigarettes  ?Smokeless Tobacco Never  ? ?BP Readings from Last 3 Encounters:  ?01/01/22 138/78  ?10/04/21 118/88  ?09/27/21 (!) 141/79  ? ?Pulse Readings from Last 3 Encounters:  ?10/04/21 69  ?09/27/21 79  ?09/13/21 72  ? ?Wt Readings from Last 3 Encounters:  ?01/01/22 164 lb 9.6 oz (74.7 kg)  ?10/04/21 172 lb 6.4 oz (78.2 kg)  ?09/27/21 174 lb 9.6 oz (79.2 kg)  ? ? ?Assessment: Review of patient past medical history, allergies, medications, health status, including review of consultants reports, laboratory and other test data, was performed as part of comprehensive evaluation and provision of chronic care management services.  ? ?SDOH:  (Social Determinants of Health) assessments and interventions performed:  ? ? ?CCM Care Plan ? ?Allergies  ?Allergen Reactions  ? Penicillins   ? Codeine Rash and Other (See Comments)  ?  dizziness  ? ? ?Medications Reviewed Today   ? ? Reviewed by [name], RPH-CPP (Pharmacist) on 02/27/22 at 1554  Med List Status: <None>  ? ?Medication Order Taking? Sig Documenting Provider Last Dose Status Informant  ?0.9 %  sodium chloride infusion [id number]   Ladene Artist, MD  Active   ?Accu-Chek Softclix Lancets lancets [id number]  Use to check blood glucose once a day (DX: type 2 DM E11.65)  Carollee Herter, Alferd Apa, DO  Active   ?azelastine (ASTELIN) 0.1 % nasal spray [id number]  Place 2 sprays into both nostrils 2 (two) times daily. Use in each nostril as directed Ann Held, DO  Active   ?blood glucose meter kit and supplies  KIT [id number]  Dispense based on patient and insurance preference. Use up to four times daily as directed. (FOR ICD-9 250.00, 250.01). Ann Held, DO  Active   ?Blood Glucose Monitoring Suppl (ACCU-CHEK GUIDE) w/Device KIT [id number]  Use to check blood glucose daily (DX: type 2 DM E 11.65) Ann Held, DO  Active   ?gabapentin (NEURONTIN) 600 MG tablet [id number] Yes TAKE 1 TABLET BY MOUTH 3  TIMES DAILY Ann Held, DO Taking Active   ?         ?Med Note Antony Contras, [place] Feb 27, 2022  3:49 PM) Taking 1 or 2 times a day as needed  ?glucose blood (ACCU-CHEK GUIDE) test strip [id number]  Use to check blood glucose once a day (Dx: type 2 DM / E11.65) Carollee Herter, Alferd Apa, DO  Active   ?hydrochlorothiazide (HYDRODIURIL) 25 MG tablet [id number] Yes TAKE 1 TABLET BY MOUTH  DAILY Ann Held, DO Taking Active   ?levothyroxine (SYNTHROID) 50 MCG tablet [id number] Yes TAKE 1 TABLET BY MOUTH DAILY Roma Schanz R, DO Taking Active   ?losartan (COZAAR) 100 MG tablet [id number] Yes TAKE 1 TABLET BY MOUTH ONCE  DAILY Roma Schanz R, DO Taking Active   ?metFORMIN (GLUCOPHAGE) 500 MG tablet [id number] Yes TAKE 1 TABLET BY MOUTH DAILY  WITH BREAKFAST  ?Patient taking differently: Take 500 mg by mouth 2 (two) times daily with a meal.  ? Carollee Herter, Alferd Apa, DO Taking Active   ?metoprolol succinate (TOPROL-XL) 100 MG 24 hr tablet [id number] Yes Take 1 tablet (100 mg total) by mouth daily. Take with or immediately following a meal. Carollee Herter, Alferd Apa, DO Taking Active   ?montelukast (SINGULAIR) 10 MG tablet [id number] No Take 1 tablet (10 mg total) by mouth at bedtime.  ?Patient not taking: Reported on 02/27/2022  ? Ann Held, DO Not Taking Active   ?NON FORMULARY [id number]  [facility]  ?Scar Cream -  ?Verapamil 10%, Pentoxifylline 5% ?Apply 1-2 grams to affected area 3-4 times daily ?Qty. 120 gm ?3 refills Provider, His

## 2022-03-05 ENCOUNTER — Encounter: Payer: Self-pay | Admitting: Registered"

## 2022-03-05 ENCOUNTER — Encounter: Payer: Medicare Other | Attending: Family Medicine | Admitting: Registered"

## 2022-03-05 DIAGNOSIS — E1165 Type 2 diabetes mellitus with hyperglycemia: Secondary | ICD-10-CM | POA: Diagnosis not present

## 2022-03-05 DIAGNOSIS — Z713 Dietary counseling and surveillance: Secondary | ICD-10-CM | POA: Insufficient documentation

## 2022-03-05 DIAGNOSIS — E119 Type 2 diabetes mellitus without complications: Secondary | ICD-10-CM

## 2022-03-05 NOTE — Patient Instructions (Signed)
-   Aim to have 1/2 plate of non-starchy vegetables + 1/4 plate of protein + 1/4 plate of carbohydrates with lunch and dinner. See page 21 of book as guide.  ? ?- Check blood glucose numbers when you feeling unusual to see if you are having high or low numbers.  ?

## 2022-03-05 NOTE — Progress Notes (Signed)
? ?Diabetes Self-Management Education ? ?Visit Type:  First/Initial ? ?Appt. Start Time: 11:28 Appt. End Time: 12:35 ? ?03/06/2022 ? ?[name], identified by name and date of birth, is a 66 y.o. female with a diagnosis of Diabetes: Type 2.   ?ASSESSMENT ? ?Pt arrives states she is checking blood glucose 1-2x/day: FBS (90) and postprandial (116-170). Pt states a cousin made ner nervous about BS getting too low and going into a diabetic coma. States when her numbers are in the 80s, she will go get something to eat. States she doesn't like to eat a lot. States her problem is that she likes sweets, graham crackers, ice cream, and cookies. States she woke up today a little before 9 am, had coffee, and no breakfast. States she has also not taken her medications due to not eating anything prior to our appointment.  ? ?States she has a twin sister who has high blood glucose numbers and this concerns pt for her own numbers.  ? ?Pt expectations: what she can eat without food being bland ? ?There were no vitals taken for this visit. ?There is no height or weight on file to calculate BMI. ?  ? Diabetes Self-Management Education - 03/05/22 1138   ? ?  ? Health Coping  ? How would you rate your overall health? Fair   ?  ? Psychosocial Assessment  ? Patient Belief/Attitude about Diabetes Denial   ? Self-care barriers None   ? Special Needs None   ? Preferred Learning Style No preference indicated   ? Learning Readiness Ready   ?  ? Complications  ? Last HgB A1C per patient/outside source 6.8 %   ? How often do you check your blood sugar? 1-2 times/day   ? Fasting Blood glucose range (mg/dL) 70-129   ? Postprandial Blood glucose range (mg/dL) 130-179;70-129   ? Number of hypoglycemic episodes per month 2   ? Can you tell when your blood sugar is low? No   ? Number of hyperglycemic episodes ( >'200mg'$ /dL): Rare   ? Can you tell when your blood sugar is high? No   ? Have you had a dilated eye exam in the past 12 months? Yes    ? Have you had a dental exam in the past 12 months? Yes   ? Are you checking your feet? No   ?  ? Dietary Intake  ? Breakfast 11-12 pm - coffee + cereal (Special K blueberry)   ? Snack (morning) 1 pm - alot of graham crackers   ? Dinner 8 pm - [employer] - salad + soda + frosty   ? Snack (evening) cereal   ? Beverage(s) unsweetened plant-based milk, soda, coffee, frosty, water (2/12 oz; 24 oz)   ?  ? Activity / Exercise  ? Activity / Exercise Type ADL's   ? How many days per week do you exercise? 0   ? How many minutes per day do you exercise? 0   ? Total minutes per week of exercise 0   ?  ? Patient Education  ? Previous Diabetes Education No   ? Healthy Eating Role of diet in the treatment of diabetes and the relationship between the three main macronutrients and blood glucose level;Plate Method;Information on hints to eating out and maintain blood glucose control.;Reviewed blood glucose goals for pre and post meals and how to evaluate the patients' food intake on their blood glucose level.   ? Monitoring Purpose and frequency of SMBG.;Taught/discussed recording of  test results and interpretation of SMBG.;Identified appropriate SMBG and/or A1C goals.;Interpreting lab values - A1C, lipid, urine microalbumina.   ? Acute complications Taught prevention, symptoms, and  treatment of hypoglycemia - the 15 rule.;Discussed and identified patients' prevention, symptoms, and treatment of hyperglycemia.   ? Lifestyle and Health Coping Lifestyle issues that need to be addressed for better diabetes care   ?  ? Individualized Goals (developed by patient)  ? Nutrition Follow meal plan discussed   ? Medications take my medication as prescribed   ? Monitoring  Test my blood glucose as discussed   ? Problem Solving Eating Pattern   ? Reducing Risk treat hypoglycemia with 15 grams of carbs if blood glucose less than '70mg'$ /dL;examine blood glucose patterns   ?  ? Post-Education Assessment  ? Patient understands the diabetes disease and  treatment process. Needs Instruction   ? Patient understands incorporating nutritional management into lifestyle. Comprehends key points   ? Patient undertands incorporating physical activity into lifestyle. Needs Instruction   ? Patient understands using medications safely. Needs Review   ? Patient understands monitoring blood glucose, interpreting and using results Comprehends key points   ? Patient understands prevention, detection, and treatment of acute complications. Comprehends key points   ? Patient understands prevention, detection, and treatment of chronic complications. Needs Instruction   ? Patient understands how to develop strategies to address psychosocial issues. Needs Instruction   ? Patient understands how to develop strategies to promote health/change behavior. Needs Review   ?  ? Outcomes  ? Program Status Not Completed   ? ?  ?  ? ?  ? ? ?Learning Objective:  Patient will have a greater understanding of diabetes self-management. ?Patient education plan is to attend individual and/or group sessions per assessed needs and concerns. ? ? ?Plan:  ? ?Patient Instructions  ?- Aim to have 1/2 plate of non-starchy vegetables + 1/4 plate of protein + 1/4 plate of carbohydrates with lunch and dinner. See page 21 of book as guide.  ? ?- Check blood glucose numbers when you feeling unusual to see if you are having high or low numbers.  ? ? ?Expected Outcomes:  Demonstrated interest in learning but significant barriers to change ? ?Education material provided: ADA - How to Thrive: A Guide for Your Journey with Diabetes ? ?If problems or questions, patient to contact team via:  Phone and Email ? ?Future DSME appointment: - 2 months ?

## 2022-03-27 DIAGNOSIS — Z7984 Long term (current) use of oral hypoglycemic drugs: Secondary | ICD-10-CM

## 2022-03-27 DIAGNOSIS — F32A Depression, unspecified: Secondary | ICD-10-CM | POA: Diagnosis not present

## 2022-03-27 DIAGNOSIS — F17210 Nicotine dependence, cigarettes, uncomplicated: Secondary | ICD-10-CM | POA: Diagnosis not present

## 2022-03-27 DIAGNOSIS — E1169 Type 2 diabetes mellitus with other specified complication: Secondary | ICD-10-CM | POA: Diagnosis not present

## 2022-03-27 DIAGNOSIS — E785 Hyperlipidemia, unspecified: Secondary | ICD-10-CM | POA: Diagnosis not present

## 2022-03-27 DIAGNOSIS — I10 Essential (primary) hypertension: Secondary | ICD-10-CM | POA: Diagnosis not present

## 2022-04-17 ENCOUNTER — Telehealth: Payer: Self-pay | Admitting: Pharmacist

## 2022-04-17 ENCOUNTER — Ambulatory Visit (INDEPENDENT_AMBULATORY_CARE_PROVIDER_SITE_OTHER): Payer: Medicare Other | Admitting: Pharmacist

## 2022-04-17 DIAGNOSIS — E1165 Type 2 diabetes mellitus with hyperglycemia: Secondary | ICD-10-CM

## 2022-04-17 DIAGNOSIS — F331 Major depressive disorder, recurrent, moderate: Secondary | ICD-10-CM

## 2022-04-17 DIAGNOSIS — E1169 Type 2 diabetes mellitus with other specified complication: Secondary | ICD-10-CM

## 2022-04-17 DIAGNOSIS — I10 Essential (primary) hypertension: Secondary | ICD-10-CM

## 2022-04-17 DIAGNOSIS — F411 Generalized anxiety disorder: Secondary | ICD-10-CM

## 2022-04-17 MED ORDER — METOPROLOL SUCCINATE ER 100 MG PO TB24: 100.0000 mg | ORAL_TABLET | Freq: Every day | ORAL | 1 refills | Status: DC
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
## 2022-04-17 NOTE — Telephone Encounter (Signed)
Patient called back - see notes for Chronic Care Management phone visit

## 2022-04-17 NOTE — Telephone Encounter (Signed)
  Care Management   Follow Up Note   04/17/2022 Name: SINTHIA KARABIN MRN: [id number] DOB: [birth year]   Referred by: Ann Held, DO Reason for referral : Appointment   An unsuccessful telephone outreach was attempted today. The patient was referred to the case management team for assistance with care management and care coordination.   Follow Up Plan: The care management team will reach out to the patient again over the next 30 days.    [name], PharmD Clinical Pharmacist [facility]

## 2022-04-18 NOTE — Patient Instructions (Addendum)
Debra [name] It was a pleasure speaking with you  again. I am checking with Dr Carollee Herter about your medication for mood / depression and will contact with recommended changes. I am also sending referral for social worker.  As a reminder, these is the goal for your blood glucose when you check at home.  Fasting blood glucose goal (before meals) = 80 to 130 Blood glucose goal after a meal = less than 180   Below is a summary of your health goals and care plan.  If you have any questions or concerns, please feel free to contact me either at the phone number below or with a MyChart message.   Keep up the good work!  [name], PharmD Clinical Pharmacist [facility] [place] [phone] (direct line)  [phone] (main office number)  Patient verbalizes understanding of instructions and care plan provided today and agrees to view in [place]. Active MyChart status and patient understanding of how to access instructions and care plan via MyChart confirmed with patient.        Diabetes: Controlled; A1c goal < 7.0% Lab Results  Component Value Date   HGBA1C 6.8 (H) 01/01/2022   Current treatment: Metformin '500mg'$  - take 1 tablet daily with breakfast.  Interventions:  Educated on diet and diabetes; Discussed which food increase blood glucose the most and about limiting frequency and serving sizes of these foods.  Recommended start checking blood glucose daily at varying times of day (both before and after meals)  Reviewed home blood glucose readings and reviewed goals  Fasting blood glucose goal (before meals) = 80 to 130 Blood glucose goal after a meal = less than 180  Request refill for glucometer and testing supplies.  Hypertension: Controlled; blood pressure goal <140/90  BP Readings from Last 3 Encounters:  01/01/22 138/78  10/04/21 118/88  09/27/21 (!) 141/79   Current treatment: Hydrochlorothiazide '25mg'$  - take 1 tablet a day Losartan '100mg'$   daily Metoprolol succinate '100mg'$  - take 1 tablet daily  Interventions:  Recommended continue current blood pressure regimen. Updated prescription for metoprolol '100mg'$  daily   Hyperlipidemia: Uncontrolled; LDL goal is < 70 and triglycerides < 150  Lab Results  Component Value Date   CHOL 183 01/01/2022   HDL 51.80 01/01/2022   LDLCALC 94 01/01/2022   LDLDIRECT 126.3 06/06/2011   TRIG 185.0 (H) 01/01/2022   CHOLHDL 4 01/01/2022   Current treatment: Pravastatin '40mg'$  daily   Interventions:  Recommended continue / restart pravastatin '40mg'$   Recheck lipids in 2 to 3 months  Depression/Anxiety Goal: decrease number of days per month with symptoms of depression or anxiety current treatment: Paroxetine '30mg'$  daily Interventions:  Coordinating with primary provider regarding therapy for depression.  Referral sent to social worker regarding stress management.   Stress / Urge Incontinence:  Current treatment:  oxybutynin '10mg'$  daily at bedtime Interventions: Follow up with pelvic floor physical therapy.   Medication management Pharmacist Clinical Goal(s): Over the next 90 days, patient will work with PharmD and providers to maintain optimal medication adherence Current pharmacy: [facility] Interventions Comprehensive medication review performed. Reviewed refill history and assessed adherence Continue current medication management strategy Updated prescription for metoprolol and requested refill for lancets and blood glucose test strips.    Patient Goals/Self-Care Activities take medications as prescribed,  check glucose daily (at varying times of day), document, and provide at future appointments,  collaborate with provider on medication access solutions, and  engage in dietary modifications by limiting in take  of high carbohydrate foods continue pravastatin '40mg'$  for heart health reschedule appointment for pelvic floor rehab  Follow Up Plan: Telephone follow up  appointment with care management team member scheduled for:  1 month

## 2022-04-19 ENCOUNTER — Telehealth: Payer: Self-pay | Admitting: *Deleted

## 2022-04-19 NOTE — Chronic Care Management (AMB) (Signed)
  Chronic Care Management   Note  04/19/2022 Name: Debra [name] MRN: [id number] DOB: June 07, 1956  Debra [name] is a 66 y.o. year old female who is a primary care patient of [name], DO. Debra [name] is currently enrolled in care management services. An additional referral for Licensed Clinical SW was placed.   Follow up plan: Telephone appointment with care management team member scheduled for: 04/26/2022  Burman Nieves, CCMA Care Guide, Embedded Care Coordination [facility]  Care Management  Direct Dial: [phone]

## 2022-04-23 ENCOUNTER — Other Ambulatory Visit: Payer: Self-pay | Admitting: Family Medicine

## 2022-04-23 DIAGNOSIS — I10 Essential (primary) hypertension: Secondary | ICD-10-CM

## 2022-04-25 MED ORDER — PAROXETINE HCL 40 MG PO TABS: 40.0000 mg | ORAL_TABLET | ORAL | 1 refills | Status: DC

## 2022-04-25 NOTE — Addendum Note (Signed)
Addended by: [name] B on: 04/25/2022 04:20 PM   Modules accepted: Orders

## 2022-04-25 NOTE — Chronic Care Management (AMB) (Signed)
Dr Carollee Herter accepted recommendation for increase in paroxetine dose to '40mg'$  daily. Patient notified and new Rx for #30 / 1 RF sent to pharmacy. Follow up planned 05/20/2022.

## 2022-04-26 ENCOUNTER — Telehealth: Payer: Medicare Other

## 2022-04-26 DIAGNOSIS — F32A Depression, unspecified: Secondary | ICD-10-CM

## 2022-04-26 DIAGNOSIS — E785 Hyperlipidemia, unspecified: Secondary | ICD-10-CM | POA: Diagnosis not present

## 2022-04-26 DIAGNOSIS — Z7984 Long term (current) use of oral hypoglycemic drugs: Secondary | ICD-10-CM

## 2022-04-26 DIAGNOSIS — I10 Essential (primary) hypertension: Secondary | ICD-10-CM | POA: Diagnosis not present

## 2022-04-26 DIAGNOSIS — E1159 Type 2 diabetes mellitus with other circulatory complications: Secondary | ICD-10-CM | POA: Diagnosis not present

## 2022-05-16 ENCOUNTER — Telehealth: Payer: Medicare Other

## 2022-05-16 ENCOUNTER — Other Ambulatory Visit: Payer: Self-pay | Admitting: Family Medicine

## 2022-05-16 ENCOUNTER — Telehealth: Payer: Self-pay | Admitting: Family Medicine

## 2022-05-16 DIAGNOSIS — I10 Essential (primary) hypertension: Secondary | ICD-10-CM

## 2022-05-16 NOTE — Telephone Encounter (Signed)
Patient called to advise that no one called her for her 2pm phone visit. She said she called a bunch of different numbers and couldn't reach anyone. Advised patient that we sent a message to [name] to see if she could either give her another call or if she could call and reschedule her. Patient is worried she will be charged for the appt. Please advise.

## 2022-05-20 ENCOUNTER — Telehealth: Payer: Medicare Other

## 2022-05-21 ENCOUNTER — Ambulatory Visit (INDEPENDENT_AMBULATORY_CARE_PROVIDER_SITE_OTHER): Payer: Medicare Other | Admitting: Pharmacist

## 2022-05-21 ENCOUNTER — Ambulatory Visit: Payer: Self-pay | Admitting: Licensed Clinical Social Worker

## 2022-05-21 DIAGNOSIS — E1165 Type 2 diabetes mellitus with hyperglycemia: Secondary | ICD-10-CM

## 2022-05-21 DIAGNOSIS — F411 Generalized anxiety disorder: Secondary | ICD-10-CM

## 2022-05-21 DIAGNOSIS — R3589 Other polyuria: Secondary | ICD-10-CM

## 2022-05-21 DIAGNOSIS — I10 Essential (primary) hypertension: Secondary | ICD-10-CM

## 2022-05-21 DIAGNOSIS — E1169 Type 2 diabetes mellitus with other specified complication: Secondary | ICD-10-CM

## 2022-05-21 DIAGNOSIS — N3946 Mixed incontinence: Secondary | ICD-10-CM

## 2022-05-21 MED ORDER — OXYBUTYNIN CHLORIDE ER 10 MG PO TB24: 10.0000 mg | ORAL_TABLET | Freq: Every day | ORAL | 2 refills | Status: DC

## 2022-05-21 NOTE — Patient Outreach (Signed)
  Care Coordination   Initial Visit Note   05/21/2022 Name: Debra [name] MRN: [id number] DOB: 1956/09/18  Debra [name] is a 66 y.o. year old female who sees Debra [name], Alferd Apa, DO for primary care. I spoke with  Debra [name] by phone today  What matters to the patients health and wellness today?  She is depressed about job issues, finances, and health concerns   Goals Addressed             This Visit's Progress    Patient Stated she wants to talk about her mood and managing depression  issues       Care Coordination Interventions:  Depression screen reviewed  PHQ2/PHQ9 completed Active listening / Reflection utilized  Emotional Support Provided     SDOH assessments and interventions completed:   Yes SDOH Interventions Today    Flowsheet Row Most Recent Value  SDOH Interventions   Depression Interventions/Treatment  Counseling, Medication   Spoke about self care and managing stress issues faced    Care Coordination Interventions Activated:  Yes Care Coordination Interventions:  Yes, provided  Follow up plan: Follow up call scheduled for August 29,2023 at 9:00 AM   Encounter Outcome:  Pt. Visit Completed  Norva Riffle.Johnta Couts MSW, [place] Holiday representative [facility] Care Management [phone]

## 2022-05-21 NOTE — Chronic Care Management (AMB) (Signed)
Chronic Care Management Pharmacy Note  05/21/2022 Name:  Debra [name] MRN:  [id number] DOB:  01/03/1956  Summary: Medication Management: Reviewed refill history and discussed recent medication administration. Patient has been more adherent to medication regimen recently. Assisted patient with requesting needed refills for paroxetine 19m and oxybutynin 190m [facility] Diabetes: Home blood glucose has been 80 to 144. She states she is a little frustrated with knowing what she should be eating. Discussed eating small meals with snacks if needed but that she does not have to eat snacks unless she is having lows between meals. She denies hypoglycemia but feels like she might be having highs sometimes at work - feels sluggish and get headache. She does not usually have blood glucose monitor at work to check. Patient will come to office to do 14 day Continuous Glucose Monitor to check blood glucose trends. Appointment for 06/14/2022 (patient's son is getting married 06/07/22 and she wanted to wait on sensor due until after wedding) Reviewed blood glucose goals. Patient will meet with dietician again tomorrow to discuss diabetic diet.  Fasting blood glucose goal (before meals) = 80 to 130 Blood glucose goal after a meal = less than 180  Depression/Anxiety: Patient reports improvement in depression and stress since starting higher dose of paroxetine (increased last month form 30 to 402maily). [facility] and he noted PHQ9 score had improved to 9 today - previously was 12.   Subjective: Debra [name] an 65 32o. year old female who is a primary patient of LowAnn HeldO.  The CCM team was consulted for assistance with disease management and care coordination needs.    Engaged with patient by telephone for follow up visit in response to provider referral for pharmacy case management and/or care coordination services.   Consent  to Services:  The patient was given information about Chronic Care Management services, agreed to services, and gave verbal consent prior to initiation of services.  Please see initial visit note for detailed documentation.   Patient Care Team: LowCarollee HertervoAlferd ApaO as PCP - General ChrBuford DresserD as PCP - Cardiology (Cardiology) BotLeeroy ChaD as Consulting Physician (Neurosurgery) SooChesley MiresD as Consulting Physician (Pulmonary Disease) EckCherre RobinsPH-CPP (Pharmacist) ForShea EvanscNorva RiffleCSW as TriHebronnagement (Licensed Clinical Social Worker)  Recent office visits: 01/01/2022 - PCP (Dr LowCarollee Herteratient seen for follow up chronic conditions. Unable to afford vesicare / generic. Changed to oxybutynin 43m73m bedtime. Added azelastin 0.1% nasal spray - 2 sprays in both nostrils twice a day.  F/U 6 months.   Recent consult visits: 03/05/2022 - Dietician (FloTyrone Nine) Diabetes nutrition education.  12/20/2021 - Ortho (Dr BenfTempie Donningen for right trigger thumb. Discussed repeat corticosteroid injection versus open A1 pulley release.  She is not interested in surgery at this point and would like to try one more injection. Received injection in right thumb. 10/10/2021 - [facility] over the right posterior foot non healing since November 2022. Wound was debrided. Provided wound care instructions.   Hospital visits: None in previous 6 months  Objective:  Lab Results  Component Value Date   CREATININE 0.78 01/01/2022   CREATININE 0.80 08/30/2021   CREATININE 0.80 05/25/2021    Lab Results  Component Value Date   HGBA1C 6.8 (H) 01/01/2022   Last diabetic Eye exam:  Lab Results  Component Value Date/Time   HMDIABEYEEXA No Retinopathy 02/06/2022 12:00 AM    Last diabetic Foot exam: No results found for: "HMDIABFOOTEX"      Component Value Date/Time   CHOL 183 01/01/2022 1752   CHOL 206 (H)  07/22/2018 1057   TRIG 185.0 (H) 01/01/2022 1752   HDL 51.80 01/01/2022 1752   HDL 48 07/22/2018 1057   CHOLHDL 4 01/01/2022 1752   VLDL 37.0 01/01/2022 1752   LDLCALC 94 01/01/2022 1752   LDLCALC 102 (H) 04/20/2021 1415   LDLDIRECT 126.3 06/06/2011 1533       Latest Ref Rng & Units 01/01/2022    5:52 PM 08/30/2021    4:00 PM 05/25/2021    2:33 PM  Hepatic Function  Total Protein 6.0 - 8.3 g/dL 7.5  8.3  7.8   Albumin 3.5 - 5.2 g/dL 4.0  4.3    AST 0 - 37 U/L _0 ALT 0 - 35 U/L _1 Alk Phosphatase 39 - 117 U/L 89  82    Total Bilirubin 0.2 - 1.2 mg/dL 0.4  0.5  0.5     Lab Results  Component Value Date/Time   TSH 1.32 01/01/2022 05:52 PM   TSH 1.26 05/25/2021 02:33 PM   FREET4 0.55 (L) 10/09/2020 02:44 PM   FREET4 0.82 07/22/2018 10:57 AM       Latest Ref Rng & Units 05/25/2021    2:33 PM 11/24/2020    2:31 PM 10/05/2020    3:54 PM  CBC  WBC 3.8 - 10.8 Thousand/uL 5.8  6.4  5.2   Hemoglobin 11.7 - 15.5 g/dL 11.6  11.1  11.4   Hematocrit 35.0 - 45.0 % 35.4  33.5  34.3   Platelets 140 - 400 Thousand/uL 260  239  276.0     Lab Results  Component Value Date/Time   VD25OH 33.57 02/27/2021 02:18 PM   VD25OH 36.68 10/05/2020 03:54 PM    Clinical ASCVD: No  The 10-year ASCVD risk score (Arnett DK, et al., 2019) is: 41.7%   Values used to calculate the score:     Age: 37 years     Sex: Female     Is Non-Hispanic African American: Yes     Diabetic: Yes     Tobacco smoker: Yes     Systolic Blood Pressure: 010 mmHg     Is BP treated: Yes     HDL Cholesterol: 51.8 mg/dL     Total Cholesterol: 183 mg/dL      Social History   Tobacco Use  Smoking Status Every Day   Packs/day: 0.75   Years: 44.00   Total pack years: 33.00   Types: Cigarettes  Smokeless Tobacco Never   BP Readings from Last 3 Encounters:  01/01/22 138/78  10/04/21 118/88  09/27/21 (!) 141/79   Pulse Readings from Last 3 Encounters:  10/04/21 69  09/27/21 79  09/13/21 72    Wt Readings from Last 3 Encounters:  01/01/22 164 lb 9.6 oz (74.7 kg)  10/04/21 172 lb 6.4 oz (78.2 kg)  09/27/21 174 lb 9.6 oz (79.2 kg)    Assessment: Review of patient past medical history, allergies, medications, health status, including review of consultants reports, laboratory and other test data, was performed as part of comprehensive evaluation and provision of chronic care management services.   SDOH:  (Social Determinants of Health) assessments and interventions performed:     CCM Care Plan  Allergies  Allergen Reactions   Penicillins    Codeine Rash and Other (See Comments)    dizziness    Medications Reviewed Today     Reviewed by Debra [name], Debra [name] (Pharmacist) on 04/18/22 at 1328  Med List Status: <None>   Medication Order Taking? Sig Documenting Provider Last Dose Status Informant  0.9 %  sodium chloride infusion [id number]   Ladene Artist, MD  Active   Accu-Chek Softclix Lancets lancets [id number] Yes Use to check blood glucose once a day (DX: type 2 DM E11.65) Carollee Herter, Alferd Apa, DO Taking Active   azelastine (ASTELIN) 0.1 % nasal spray [id number] Yes Place 2 sprays into both nostrils 2 (two) times daily. Use in each nostril as directed Ann Held, DO Taking Active   blood glucose meter kit and supplies KIT [id number] Yes Dispense based on patient and insurance preference. Use up to four times daily as directed. (FOR ICD-9 250.00, 250.01). Ann Held, DO Taking Active   Blood Glucose Monitoring Suppl (ACCU-CHEK GUIDE) w/Device KIT [id number] Yes Use to check blood glucose daily (DX: type 2 DM E 11.65) Ann Held, DO Taking Active   gabapentin (NEURONTIN) 600 MG tablet [id number] Yes TAKE 1 TABLET BY MOUTH 3  TIMES DAILY Ann Held, DO Taking Active            Med Note Antony Contras, Quiara Killian B   Wed Feb 27, 2022  3:49 PM) Taking 1 or 2 times a day as needed  glucose blood (ACCU-CHEK GUIDE) test strip [id number] Yes Use to  check blood glucose once a day (Dx: type 2 DM / E11.65) Carollee Herter, Alferd Apa, DO Taking Active   hydrochlorothiazide (HYDRODIURIL) 25 MG tablet [id number] Yes TAKE 1 TABLET BY MOUTH  DAILY Ann Held, DO Taking Active   levothyroxine (SYNTHROID) 50 MCG tablet [id number] Yes TAKE 1 TABLET BY MOUTH DAILY Ann Held, DO Taking Active   losartan (COZAAR) 100 MG tablet [id number] Yes TAKE 1 TABLET BY MOUTH ONCE  DAILY Ann Held, DO Taking Active   metFORMIN (GLUCOPHAGE) 500 MG tablet [id number] Yes TAKE 1 TABLET BY MOUTH DAILY  WITH BREAKFAST  Patient taking differently: Take 500 mg by mouth 2 (two) times daily with a meal.   Carollee Herter, Alferd Apa, DO Taking Active   metoprolol succinate (TOPROL-XL) 100 MG 24 hr tablet [id number]  Take 1 tablet (100 mg total) by mouth daily. Take with or immediately following a meal. Carollee Herter, Alferd Apa, DO  Active   montelukast (SINGULAIR) 10 MG tablet [id number] Yes Take 1 tablet (10 mg total) by mouth at bedtime. Carollee Herter, [place], DO Taking Active   NON FORMULARY [id number] Yes [facility]  Scar Cream -  Verapamil 10%, Pentoxifylline 5% Apply 1-2 grams to affected area 3-4 times daily Qty. 120 gm 3 refills [provider] Taking Active   oxybutynin (DITROPAN-XL) 10 MG 24 hr tablet [id number] Yes Take 1 tablet (10 mg total) by mouth at bedtime. Ann Held, DO Taking Active     Discontinued 01/17/22 1530 (Reorder)   PARoxetine (PAXIL) 30 MG tablet [id number] Yes Take 1 tablet (30 mg total) by mouth daily. Ann Held, DO Taking Active   pravastatin (PRAVACHOL) 40 MG tablet [id number] Yes Take 1 tablet (40 mg total) by mouth daily. For lowering cholesterol and heart health Carollee Herter, Alferd Apa, DO Taking Active  Patient Active Problem List   Diagnosis Date Noted   Snapping lateral band due to ligament laxity 02/19/2022   Moderate episode of recurrent major depressive disorder  (HCC) 01/01/2022   Polyuria 01/01/2022   Mixed stress and urge urinary incontinence 01/01/2022   Wound infection 10/04/2021   Wound of right ankle 09/27/2021   Trigger thumb, right thumb 09/13/2021   Pruritic dermatitis 05/25/2021   Vaginal discharge 05/25/2021   Balance disorder 05/25/2021   Dizziness 05/25/2021   Osteoarthritis    Vitamin D deficiency    Urinary frequency 10/31/2020   Chronic pain of both shoulders 10/31/2020   Female pattern baldness 10/06/2020   Myalgia 10/06/2020   Urge incontinence of urine 10/06/2020   History of diabetes mellitus, type II 01/11/2020   Allergies 02/03/2019   Acute vaginitis 04/22/2018   Morbid obesity 04/22/2018   Generalized anxiety disorder 04/22/2018   Obstructive sleep apnea 01/30/2018   Major depressive disorder 01/30/2018   Pansinusitis 01/30/2018   Aortic atherosclerosis 10/10/2017   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   Hyperlipidemia 04/17/2017   Anemia, iron deficiency 04/17/2017   Atypical chest pain 08/31/2014   Arm weakness 08/31/2014   Hearing loss 07/24/2012   Hypothyroidism 11/02/2010   Essential hypertension 08/31/2010   Hyperglycemia, fasting 50/27/7412   Diastolic dysfunction 87/86/7672   Back pain with radiculopathy 01/25/2010   Fatigue 01/25/2010   Tobacco abuse 12/01/2008   Restless legs syndrome 12/01/2008   Leg pain, bilateral 11/17/2008   Palpitations 02/12/2008   GERD (gastroesophageal reflux disease) 07/16/2007   Hiatal hernia 07/16/2007   Knee pain 07/16/2007   Low back pain 07/16/2007   Peripheral vertigo 04/30/2007   Insomnia 04/30/2007   Abdominal pain, right upper quadrant 04/30/2007    Immunization History  Administered Date(s) Administered   Fluad Quad(high Dose 65+) 08/30/2021   Influenza Whole 10/01/2007, 08/31/2010   Influenza,inj,Quad PF,6+ Mos 10/12/2013, 08/29/2015, 08/13/2016, 08/12/2017, 08/03/2018   Influenza-Unspecified 08/10/2019   PFIZER Comirnaty(Gray Top)Covid-19 Tri-Sucrose  Vaccine 02/27/2021   PFIZER(Purple Top)SARS-COV-2 Vaccination 01/11/2020, 01/31/2020, 08/24/2020   Pfizer Covid-19 Vaccine Bivalent Booster 31yr & up 08/29/2021   Pneumococcal Polysaccharide-23 02/27/2021   Td 11/02/2010, 09/27/2021   Tdap 08/03/2018   Zoster Recombinat (Shingrix) 04/17/2017, 06/17/2017   Zoster, Live 03/12/2016    Conditions to be addressed/monitored: HTN, HLD, DMII, Anxiety, Depression, GERD, Tobacco Use, and stress / urge incontinence  There are no care plans that you recently modified to display for this patient.      Medication Assistance: None required.  Patient affirms current coverage meets needs.  Patient's preferred pharmacy is:  [facility] [place] [address]Phone: [phone]: [phone] [id number]- [address]Phone: [phone]: [phone] [facility] ([place] - [address] [address] [address] [place]Phone: [phone]: [phone] [facility] ([facility] ) - [place] [address]Phone: [phone]: [phone]  Follow Up:  Patient agrees to Care Plan and Follow-up.  Plan: Telephone follow up appointment with care management team member scheduled for:  1 month  TCherre [name] PharmD Clinical Pharmacist [facility] SW [facility]

## 2022-05-21 NOTE — Patient Instructions (Signed)
Visit Information  Thank you for taking time to visit with me today. Please don't hesitate to contact me if I can be of assistance to you.   Following are the goals we discussed today:   Goals Addressed             This Visit's Progress    Patient Stated she wants to talk about her mood and managing depression  issues       Care Coordination Interventions:  Depression screen reviewed  PHQ2/PHQ9 completed Active listening / Reflection utilized  Emotional Support Provided  Provided counseling support Spoke of job concerns of client        Our next appointment is by telephone on 06/25/22 at 9:00 AM   Please call the care guide team at [phone] if you need to cancel or reschedule your appointment.   If you are experiencing a Mental Health or [place] or need someone to talk to, please go to [facility] [place] [phone])  Follow Up Plan:  LCSW to call client on 06/25/22 at 9:00 AM  Norva Riffle.[place] MSW, [place] Holiday representative [facility] Care Management [phone]

## 2022-05-21 NOTE — Patient Instructions (Addendum)
[name] It was a pleasure speaking with you  again.    Patient Goals/Self-Care Activities Over the next 90 days, patient will:  take medications as prescribed,  check glucose daily (at varying times of day), document, and provide at future appointments,  collaborate with provider on medication access solutions, and  engage in dietary modifications by limiting in take of high carbohydrate foods continue pravastatin '40mg'$  for heart health  Below is a summary of your health goals and care plan.  If you have any questions or concerns, please feel free to contact me either at the phone number below or with a MyChart message.   Keep up the good work!  Debra [name], PharmD Clinical Pharmacist [place] [phone] (direct line)  [phone] (main office number)  Patient verbalizes understanding of instructions and care plan provided today and agrees to view in [place]. Active MyChart status and patient understanding of how to access instructions and care plan via MyChart confirmed with patient.       Diabetes: Controlled; A1c goal < 7.0% Lab Results  Component Value Date   HGBA1C 6.8 (H) 01/01/2022   Current treatment: Metformin '500mg'$  - take 1 tablet daily with breakfast.  Interventions:  Educated on diet and diabetes; Discussed which food increase blood glucose the most and about limiting frequency and serving sizes of these foods.  Recommended start checking blood glucose daily at varying times of day (both before and after meals)  Reviewed home blood glucose readings and reviewed goals  Fasting blood glucose goal (before meals) = 80 to 130 Blood glucose goal after a meal = less than 180   Hypertension: Controlled; blood pressure goal <140/90  BP Readings from Last 3 Encounters:  01/01/22 138/78  10/04/21 118/88  09/27/21 (!) 141/79   Current treatment: Hydrochlorothiazide '25mg'$  - take 1 tablet a day Losartan '100mg'$   daily Metoprolol succinate '100mg'$  - take 1 tablet daily  Interventions:  Recommended continue current blood pressure regimen.   Hyperlipidemia: Uncontrolled; LDL goal is < 70 and triglycerides < 150  Lab Results  Component Value Date   CHOL 183 01/01/2022   HDL 51.80 01/01/2022   LDLCALC 94 01/01/2022   LDLDIRECT 126.3 06/06/2011   TRIG 185.0 (H) 01/01/2022   CHOLHDL 4 01/01/2022   Current treatment: Pravastatin '40mg'$  daily   Interventions:  Recommended continue / restart pravastatin '40mg'$   Recheck lipids in 2 to 3 months  Depression/Anxiety Goal: decrease number of days per month with symptoms of depression or anxiety current treatment: Paroxetine '40mg'$  daily Interventions:  Continue current dose of paroxetine Continue to follow up with social worker regarding stress management.   Stress / Urge Incontinence:  Current treatment:  oxybutynin '10mg'$  daily at bedtime Interventions: Follow up with pelvic floor physical therapy.   Medication management Pharmacist Clinical Goal(s): Over the next 90 days, patient will work with PharmD and providers to maintain optimal medication adherence Current pharmacy: [facility] Interventions Comprehensive medication review performed. Reviewed refill history and assessed adherence Continue current medication management strategy Updated prescription for metoprolol and requested refill for lancets and blood glucose test strips.
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
## 2022-05-27 DIAGNOSIS — I10 Essential (primary) hypertension: Secondary | ICD-10-CM | POA: Diagnosis not present

## 2022-05-27 DIAGNOSIS — E1165 Type 2 diabetes mellitus with hyperglycemia: Secondary | ICD-10-CM

## 2022-05-27 DIAGNOSIS — E785 Hyperlipidemia, unspecified: Secondary | ICD-10-CM

## 2022-05-27 DIAGNOSIS — E1169 Type 2 diabetes mellitus with other specified complication: Secondary | ICD-10-CM | POA: Diagnosis not present

## 2022-05-28 ENCOUNTER — Encounter: Payer: Self-pay | Admitting: Registered"

## 2022-05-28 ENCOUNTER — Encounter: Payer: Medicare Other | Attending: Family Medicine | Admitting: Registered"

## 2022-05-28 DIAGNOSIS — E119 Type 2 diabetes mellitus without complications: Secondary | ICD-10-CM | POA: Diagnosis not present

## 2022-05-28 NOTE — Progress Notes (Signed)
Diabetes Self-Management Education  Visit Type: Follow-up  Appt. Start Time: 11:28 Appt. End Time: 12:30  05/29/2022  [name], identified by name and date of birth, is a 66 y.o. female with a diagnosis of Diabetes:  .   ASSESSMENT  States they increased her paxil because she was getting depressed and metoprolol to reduce needing to take 2 pills and now taking 1 pill. States sometimes she forgets to take her medicine as she should. Still checking BS 1-2x/day: FBS (80-108) and after meals (80-136). States last night postprandial glucose was 169, which is the highest its ever been.   Pt arrives stating the diabetes book was ok but didn't help her with helping her with calories and what to eat. Reports she made ribs + kale once and likes to eat kale.  States she has a challenges figuring out what to buy at the grocery store to create meals. Reports feeling overwhelmed in the grocery store and staying for hours when getting groceries.   States she used to eat gallons of ice cream and has replaced with graham crackers. States she is having a hard time finding something healthy to replace graham crackers with.  States she has been having a lot of dizziness and feeling off balanced. States doesn't fall and it occurs before she eats during the week. States on the weekends she eats a lot of things consistently: sugar-free pudding, graham crackers, etc throughout the day while she is working. States she is accustomed to eating one meal/day during the week and would rely on ice cream.  States sometimes she is is tired from work and does not feel like prepping and cooking meals. 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 States she is often called to work during the week. States her phone ringing all of the time for work is stressful sometimes.   There were no vitals taken for this visit. There is no height or weight on file to calculate BMI.   Diabetes Self-Management  Education - 05/28/22 1135       Visit Information   Visit Type Follow-up      Psychosocial Assessment   What is the hardest part about your diabetes right now, causing you the most concern, or is the most worrisome to you about your diabetes?   Making healty food and beverage choices    Self-care barriers None    Other persons present Patient    Patient Concerns Nutrition/Meal planning    Special Needs None    Preferred Learning Style No preference indicated      Complications   How often do you check your blood sugar? 1-2 times/day    Fasting Blood glucose range (mg/dL) 70-129    Postprandial Blood glucose range (mg/dL) 70-129;130-179    Number of hypoglycemic episodes per month 2      Patient Education   Healthy Eating Meal options for control of blood glucose level and chronic complications.;Information on hints to eating out and maintain blood glucose control.;Plate Method    Monitoring Purpose and frequency of SMBG.;Interpreting lab values - A1C, lipid, urine microalbumina.;Identified appropriate SMBG and/or A1C goals.    Acute complications Taught prevention, symptoms, and  treatment of hypoglycemia - the 15 rule.      Post-Education Assessment   Patient understands the diabetes disease and treatment process. Needs Review    Patient understands incorporating nutritional management into lifestyle. Comprehends key points    Patient undertands incorporating physical activity into lifestyle. Needs  Review    Patient understands using medications safely. Needs Review    Patient understands monitoring blood glucose, interpreting and using results Comprehends key points    Patient understands prevention, detection, and treatment of acute complications. Needs Review    Patient understands prevention, detection, and treatment of chronic complications. Needs Instruction    Patient understands how to develop strategies to address psychosocial issues. Needs Review    Patient understands  how to develop strategies to promote health/change behavior. Needs Review      Outcomes   Expected Outcomes Demonstrated interest in learning. Expect positive outcomes    Future DMSE 2 months    Program Status Not Completed      Subsequent Visit   Since your last visit have you had your blood pressure checked? No    Since your last visit have you experienced any weight changes? No change    Since your last visit, are you checking your blood glucose at least once a day? Yes             Individualized Plan for Diabetes Self-Management Training:   Learning Objective:  Patient will have a greater understanding of diabetes self-management. Patient education plan is to attend individual and/or group sessions per assessed needs and concerns.   Plan:   Patient Instructions  - Aim to have 3 meals + snacks daily.   - Remember to eat during the week. See meal guide created.   Expected Outcomes:  Demonstrated interest in learning. Expect positive outcomes  Education material provided: none  If problems or questions, patient to contact team via:  Phone and Email  Future DSME appointment: 2 months

## 2022-05-28 NOTE — Patient Instructions (Signed)
-   Aim to have 3 meals + snacks daily.   - Remember to eat during the week. See meal guide created.

## 2022-06-02 ENCOUNTER — Other Ambulatory Visit: Payer: Self-pay | Admitting: Family Medicine

## 2022-06-02 DIAGNOSIS — E1165 Type 2 diabetes mellitus with hyperglycemia: Secondary | ICD-10-CM

## 2022-06-12 ENCOUNTER — Telehealth: Payer: Medicare Other

## 2022-06-14 ENCOUNTER — Ambulatory Visit (INDEPENDENT_AMBULATORY_CARE_PROVIDER_SITE_OTHER): Payer: Medicare Other | Admitting: Pharmacist

## 2022-06-14 VITALS — BP 110/70 | HR 71

## 2022-06-14 DIAGNOSIS — F411 Generalized anxiety disorder: Secondary | ICD-10-CM

## 2022-06-14 DIAGNOSIS — E1165 Type 2 diabetes mellitus with hyperglycemia: Secondary | ICD-10-CM

## 2022-06-14 DIAGNOSIS — I10 Essential (primary) hypertension: Secondary | ICD-10-CM

## 2022-06-14 NOTE — Chronic Care Management (AMB) (Signed)
Chronic Care Management Pharmacy Note  06/14/2022 Name:  Debra [name] MRN:  [id number] DOB:  08-25-1956  Summary: Medication Management: Reviewed refill history and discussed recent medication administration. Patient has been more adherent to medication regimen recently. Hypertension: blood pressure at goal today. Continue current medications.  Diabetes: Patient continues to be concerned about blood glucose and diet. She is taking metformin 58m daily. Discussed eating small meals with snacks if needed but that she does not have to eat snacks unless she is having lows between meals. She denies hypoglycemia but feels like she might be having highs sometimes at work - feels sluggish and get headache. She does not usually have blood glucose monitor at work to check. Placed sample Continuous Glucose Monitor for 14 days today to check blood glucose trends. Patient educated about proper care of CGM and site.  CGM was placed on back of upper left arm.  Patient will wear for 14 days. She will keep diet and medication administration records. She is advised she can engage in regular activities with special care when showering, changing clothes and swimming (only up to 3 feet and for 30 minutes at a time)  Reviewed blood glucose goals. Patient will meet with dietician again 07/2022. Fasting blood glucose goal (before meals) = 80 to 130 Blood glucose goal after a meal = less than 180   Depression/Anxiety: Patient reports improvement in depression and stress since starting higher dose of paroxetine (increased from 30 to 496mdaily). She [facility] 06/25/2022  Freestyle Libre 3 placed today - Lot #[id number] / Exp: 09/26/2022  Subjective: Debra [name] an [age].o. year old female who is a primary patient of LoAnn HeldDO.  The CCM team was consulted for assistance with disease management and care coordination needs.    Engaged with patient by  telephone for follow up visit in response to provider referral for pharmacy case management and/or care coordination services.   Consent to Services:  The patient was given information about Chronic Care Management services, agreed to services, and gave verbal consent prior to initiation of services.  Please see initial visit note for detailed documentation.   Patient Care Team: LoCarollee HerterYvAlferd ApaDO as PCP - General ChBuford DresserMD as PCP - Cardiology (Cardiology) BoLeeroy ChaMD as Consulting Physician (Neurosurgery) SoChesley MiresMD as Consulting Physician (Pulmonary Disease) EcCherre RobinsRPH-CPP (Pharmacist) FoShea EvansiNorva RiffleLCSW as TrEvansvilleanagement (Licensed Clinical Social Worker)  Recent office visits: 01/01/2022 - PCP (Dr LoCarollee Herterpatient seen for follow up chronic conditions. Unable to afford vesicare / generic. Changed to oxybutynin 1037mt bedtime. Added azelastin 0.1% nasal spray - 2 sprays in both nostrils twice a day.  F/U 6 months.   Recent consult visits: 05/28/2022 - Dietician (FlTyrone NineD) Patient Instructions:  Aim to have 3 meals + snacks daily.  Remember to eat during the week. See meal guide created. F/U 2 months.  03/05/2022 - Dietician (FlTyrone NineD) Diabetes nutrition education.  12/20/2021 - Ortho (Dr BenTempie Donningeen for right trigger thumb. Discussed repeat corticosteroid injection versus open A1 pulley release.  She is not interested in surgery at this point and would like to try one more injection. Received injection in right thumb. 10/10/2021 - Wound [facility] ([place] for wound over the right posterior foot non healing since November 2022. Wound was debrided. Provided wound care instructions.   Hospital visits: None  in previous 6 months  Objective:  Lab Results  Component Value Date   CREATININE 0.78 01/01/2022   CREATININE 0.80 08/30/2021   CREATININE 0.80 05/25/2021    Lab Results  Component Value  Date   HGBA1C 6.8 (H) 01/01/2022   Last diabetic Eye exam:  Lab Results  Component Value Date/Time   HMDIABEYEEXA No Retinopathy 02/06/2022 12:00 AM    Last diabetic Foot exam: No results found for: "HMDIABFOOTEX"      Component Value Date/Time   CHOL 183 01/01/2022 1752   CHOL 206 (H) 07/22/2018 1057   TRIG 185.0 (H) 01/01/2022 1752   HDL 51.80 01/01/2022 1752   HDL 48 07/22/2018 1057   CHOLHDL 4 01/01/2022 1752   VLDL 37.0 01/01/2022 1752   LDLCALC 94 01/01/2022 1752   LDLCALC 102 (H) 04/20/2021 1415   LDLDIRECT 126.3 06/06/2011 1533       Latest Ref Rng & Units 01/01/2022    5:52 PM 08/30/2021    4:00 PM 05/25/2021    2:33 PM  Hepatic Function  Total Protein 6.0 - 8.3 g/dL 7.5  8.3  7.8   Albumin 3.5 - 5.2 g/dL 4.0  4.3    AST 0 - 37 U/L _0 ALT 0 - 35 U/L _1 Alk Phosphatase 39 - 117 U/L 89  82    Total Bilirubin 0.2 - 1.2 mg/dL 0.4  0.5  0.5     Lab Results  Component Value Date/Time   TSH 1.32 01/01/2022 05:52 PM   TSH 1.26 05/25/2021 02:33 PM   FREET4 0.55 (L) 10/09/2020 02:44 PM   FREET4 0.82 07/22/2018 10:57 AM       Latest Ref Rng & Units 05/25/2021    2:33 PM 11/24/2020    2:31 PM 10/05/2020    3:54 PM  CBC  WBC 3.8 - 10.8 Thousand/uL 5.8  6.4  5.2   Hemoglobin 11.7 - 15.5 g/dL 11.6  11.1  11.4   Hematocrit 35.0 - 45.0 % 35.4  33.5  34.3   Platelets 140 - 400 Thousand/uL 260  239  276.0     Lab Results  Component Value Date/Time   VD25OH 33.57 02/27/2021 02:18 PM   VD25OH 36.68 10/05/2020 03:54 PM    Clinical ASCVD: No  The 10-year ASCVD risk score (Arnett DK, et al., 2019) is: 27.2%   Values used to calculate the score:     Age: 1 years     Sex: Female     Is Non-Hispanic African American: Yes     Diabetic: Yes     Tobacco smoker: Yes     Systolic Blood Pressure: 235 mmHg     Is BP treated: Yes     HDL Cholesterol: 51.8 mg/dL     Total Cholesterol: 183 mg/dL      Social History   Tobacco Use  Smoking Status  Every Day   Packs/day: 0.75   Years: 44.00   Total pack years: 33.00   Types: Cigarettes  Smokeless Tobacco Never   BP Readings from Last 3 Encounters:  06/14/22 110/70  01/01/22 138/78  10/04/21 118/88   Pulse Readings from Last 3 Encounters:  06/14/22 71  10/04/21 69  09/27/21 79   Wt Readings from Last 3 Encounters:  01/01/22 164 lb 9.6 oz (74.7 kg)  10/04/21 172 lb 6.4 oz (78.2 kg)  09/27/21 174 lb 9.6 oz (79.2 kg)    Assessment: Review of patient  past medical history, allergies, medications, health status, including review of consultants reports, laboratory and other test data, was performed as part of comprehensive evaluation and provision of chronic care management services.   SDOH:  (Social Determinants of Health) assessments and interventions performed:     CCM Care Plan  Allergies  Allergen Reactions   Penicillins    Codeine Rash and Other (See Comments)    dizziness    Medications Reviewed Today     Reviewed by Ardelle Lesches, RD (Registered Dietitian) on 05/28/22 at 1133  Med List Status: <None>   Medication Order Taking? Sig Documenting Provider Last Dose Status Informant  0.9 %  sodium chloride infusion [id number]   Ladene Artist, MD  Active   Accu-Chek Softclix Lancets lancets [id number] No Use to check blood glucose once a day (DX: type 2 DM E11.65) Carollee Herter, Alferd Apa, DO Taking Active   azelastine (ASTELIN) 0.1 % nasal spray [id number] No Place 2 sprays into both nostrils 2 (two) times daily. Use in each nostril as directed Ann Held, DO Taking Active   blood glucose meter kit and supplies KIT [id number] No Dispense based on patient and insurance preference. Use up to four times daily as directed. (FOR ICD-9 250.00, 250.01). Ann Held, DO Taking Active   Blood Glucose Monitoring Suppl (ACCU-CHEK GUIDE) w/Device KIT [id number] No Use to check blood glucose daily (DX: type 2 DM E 11.65) Ann Held, DO Taking Active    gabapentin (NEURONTIN) 600 MG tablet [id number] No TAKE 1 TABLET BY MOUTH 3  TIMES DAILY Ann Held, DO Taking Active            Med Note Antony Contras, Cici Rodriges B   Wed Feb 27, 2022  3:49 PM) Taking 1 or 2 times a day as needed  glucose blood (ACCU-CHEK GUIDE) test strip [id number] No Use to check blood glucose once a day (Dx: type 2 DM / E11.65) Carollee Herter, Alferd Apa, DO Taking Active   hydrochlorothiazide (HYDRODIURIL) 25 MG tablet [id number] No TAKE 1 TABLET BY MOUTH DAILY Ann Held, DO Taking Active   levothyroxine (SYNTHROID) 50 MCG tablet [id number] No TAKE 1 TABLET BY MOUTH DAILY Carollee Herter, Alferd Apa, DO Taking Active   losartan (COZAAR) 100 MG tablet [id number] No TAKE 1 TABLET BY MOUTH ONCE  DAILY Roma Schanz R, DO Taking Active   metFORMIN (GLUCOPHAGE) 500 MG tablet [id number] No TAKE 1 TABLET BY MOUTH DAILY  WITH BREAKFAST  Patient taking differently: Take 500 mg by mouth daily with breakfast.   Carollee Herter, Alferd Apa, DO Taking Active   metoprolol succinate (TOPROL-XL) 100 MG 24 hr tablet [id number] No Take 1 tablet (100 mg total) by mouth daily. Take with or immediately following a meal. Carollee Herter, Alferd Apa, DO Taking Active   montelukast (SINGULAIR) 10 MG tablet [id number] No Take 1 tablet (10 mg total) by mouth at bedtime. Carollee Herter, [place], DO Taking Active   NON FORMULARY [id number] No [facility]  Scar Cream -  Verapamil 10%, Pentoxifylline 5% Apply 1-2 grams to affected area 3-4 times daily Qty. 120 gm 3 refills [provider] Taking Active   oxybutynin (DITROPAN-XL) 10 MG 24 hr tablet [id number]  Take 1 tablet (10 mg total) by mouth at bedtime. Roma Schanz R, DO  Active   Discontinued 01/17/22 1530 (Reorder)   PARoxetine (PAXIL) 40 MG tablet [id number] No Take 1 tablet (40 mg total) by mouth  every morning. Ann Held, DO Taking Active   pravastatin (PRAVACHOL) 40 MG tablet [id number] No Take 1 tablet (40 mg total) by mouth  daily. For lowering cholesterol and heart health Ann Held, DO Taking Active             Patient Active Problem List   Diagnosis Date Noted   Snapping lateral band due to ligament laxity 02/19/2022   Moderate episode of recurrent major depressive disorder ([place]) 01/01/2022   Polyuria 01/01/2022   Mixed stress and urge urinary incontinence 01/01/2022   Wound infection 10/04/2021   Wound of right ankle 09/27/2021   Trigger thumb, right thumb 09/13/2021   Pruritic dermatitis 05/25/2021   Vaginal discharge 05/25/2021   Balance disorder 05/25/2021   Dizziness 05/25/2021   Osteoarthritis    Vitamin D deficiency    Urinary frequency 10/31/2020   Chronic pain of both shoulders 10/31/2020   Female pattern baldness 10/06/2020   Myalgia 10/06/2020   Urge incontinence of urine 10/06/2020   History of diabetes mellitus, type II 01/11/2020   Allergies 02/03/2019   Acute vaginitis 04/22/2018   Morbid obesity 04/22/2018   Generalized anxiety disorder 04/22/2018   Obstructive sleep apnea 01/30/2018   Major depressive disorder 01/30/2018   Pansinusitis 01/30/2018   Aortic atherosclerosis 10/10/2017   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   Hyperlipidemia 04/17/2017   Anemia, iron deficiency 04/17/2017   Atypical chest pain 08/31/2014   Arm weakness 08/31/2014   Hearing loss 07/24/2012   Hypothyroidism 11/02/2010   Essential hypertension 08/31/2010   Hyperglycemia, fasting 09/47/0962   Diastolic dysfunction 83/66/2947   Back pain with radiculopathy 01/25/2010   Fatigue 01/25/2010   Tobacco abuse 12/01/2008   Restless legs syndrome 12/01/2008   Leg pain, bilateral 11/17/2008   Palpitations 02/12/2008   GERD (gastroesophageal reflux disease) 07/16/2007   Hiatal hernia 07/16/2007   Knee pain 07/16/2007   Low back pain 07/16/2007   Peripheral vertigo 04/30/2007   Insomnia 04/30/2007   Abdominal pain, right upper quadrant 04/30/2007    Immunization History  Administered  Date(s) Administered   Fluad Quad(high Dose 65+) 08/30/2021   Influenza Whole 10/01/2007, 08/31/2010   Influenza,inj,Quad PF,6+ Mos 10/12/2013, 08/29/2015, 08/13/2016, 08/12/2017, 08/03/2018   Influenza-Unspecified 08/10/2019   PFIZER Comirnaty(Gray Top)Covid-19 Tri-Sucrose Vaccine 02/27/2021   PFIZER(Purple Top)SARS-COV-2 Vaccination 01/11/2020, 01/31/2020, 08/24/2020   Pfizer Covid-19 Vaccine Bivalent Booster 76yr & up 08/29/2021   Pneumococcal Polysaccharide-23 02/27/2021   Td 11/02/2010, 09/27/2021   Tdap 08/03/2018   Zoster Recombinat (Shingrix) 04/17/2017, 06/17/2017   Zoster, Live 03/12/2016    Conditions to be addressed/monitored: HTN, HLD, DMII, Anxiety, Depression, GERD, Tobacco Use, and stress / urge incontinence  Care Plan : General Pharmacy (Adult)  Updates made by ECherre Robins RPH-CPP since 06/14/2022 12:00 AM     Problem: Chronic Conditions: type 2 DM; HTN; HDL; OSA; depression / GAD; stress / urge incontinence; GERD; hypothyroidism; neuropathy; aortic atherosclerosis   Priority: High  Onset Date: 01/17/2022     Long-Range Goal: Provide education, support and care coordination for medication therapy and chronic conditions   Start Date: 01/17/2022  Recent Progress: On track  Priority: High  Note:   Current Barriers:  Unable to independently monitor therapeutic efficacy Unable to achieve control of hyperlipidemia  Unable to maintain control of stress/ urge incontinence Does not adhere to prescribed medication regimen Education needed regarding type 2 DM, goals and diet Increase in depression symptoms  Pharmacist Clinical Goal(s):  Over the next 90 days, patient will achieve adherence  to monitoring guidelines and medication adherence to achieve therapeutic efficacy achieve control of hyperlipidemia as evidenced by LDL < 70 maintain control of type 2 DM as evidenced by A1c < 7.0%  adhere to prescribed medication regimen as evidenced by refill  history Demonstrate knowledge needed to adequately monitor and manage type 2 DM  through collaboration with PharmD and provider.  Coordinate with provider and social worker for treatment of depression. Goal is to decrease number of day with depression symptoms.  Interventions: 1:1 collaboration with Carollee Herter, Alferd Apa, DO regarding development and update of comprehensive plan of care as evidenced by provider attestation and co-signature Inter-disciplinary care team collaboration (see longitudinal plan of care) Comprehensive medication review performed; medication list updated in electronic medical record  Diabetes: Controlled; A1c goal < 7.0% Current treatment: Metformin 581m - take 1 tablet daily Current glucose readings: ranges form 118 to 144 per patient.  Occasionally has dizziness which she is not sure is related to low blood glucose or not.  Complications: neuropathy, aortic atherosclerosis Current meal patterns: improved foods choices and trying to limit intake of high carbohydrate / sugar foods; patient has lots of questions about food effect on blood glucose.  Last dietician visit was 05/2022 Interventions:  Educated on diet and diabetes; Reviewed which food increase blood glucose the most and about limiting frequency and serving sizes of these foods. Continue to follow up with dietician as planned.  Continue to checking blood glucose daily at varying times of day (both before and after meals).  06/14/2022 placed 14 day Continuous Glucose Monitor to check blood glucose trends to identify possible hypo and hyperglycemia Reviewed home blood glucose readings and reviewed goals  Fasting blood glucose goal (before meals) = 80 to 130 Blood glucose goal after a meal = less than 180  Reminded patient to schedule annual eye exam  Hypertension: Controlled; blood pressure goal <140/90 BP Readings from Last 3 Encounters:  06/14/22 110/70  01/01/22 138/78  10/04/21 118/88  current  treatment: Hydrochlorothiazide 273m- take 1 tablet a day Losartan 10056maily Metoprolol succinate 100m7mtake 1 tablet daily  Current home readings: not checking at home Denies hypotensive/hypertensive symptoms Interventions:  Educated on benefits of blood pressure control for prevention of heart and kidney disease Recommended continue current medications for hypertension   Hyperlipidemia: Uncontrolled; LDL goal is < 70 and triglycerides < 150 Lab Results  Component Value Date   CHOL 183 01/01/2022   HDL 51.80 01/01/2022   LDLCALC 94 01/01/2022   LDLDIRECT 126.3 06/06/2011   TRIG 185.0 (H) 01/01/2022   CHOLHDL 4 01/01/2022  Current treatment: Pravastatin 40mg79mly (last refilled 01/17/2022) Medications previously tried: pravastatin 20mg 22meffective  Patient states she does not need refil of pravastatin for another 1 to 2 weeks. Asked if she ever forgets to take pravastatin as it should be due for refill this week and she admits that she does miss evening dose about 2 times per week. She is using pill container.  Interventions:  Educated on lipid goals Counseled on benefits of statin therapy Recommended continue pravastatin 40mg  60mepression/Anxiety Improved since dose of paroxetine increased 03/2022 Recent increase in activity. Happy about son's recent marriage.  Goal: decrease number of days per month with symptoms of depression or anxiety    05/21/2022   12:06 PM 04/17/2022    4:06 PM 03/05/2022   11:33 AM  PHQ9 SCORE ONLY  PHQ-9 Total Score 8 12 0  Current treatment: Paroxetine 40mg61m  daily Interventions:  Discussed importance of adherence with treatment to get most benefits Continue to meet with social worker for stress management.   Stress / Urge Incontinence:  Improving with oxybutynin but still having some incontinence Was prescribed Vesicare but copay of too high Attended 1 session of pelvic PT for urinary incontinence.  Interventions: Encouraged her to  reschedule appointment for pelvic floor rehab Continue oxybutynin - sent in updated prescription  Medication management Pharmacist Clinical Goal(s): Over the next 90 days, patient will work with PharmD and providers to maintain optimal medication adherence Current pharmacy: [facility] Interventions Comprehensive medication review performed. Reviewed refill history and assessed adherence Continue current medication management strategy Updated prescription for metoprolol succinate 157m ER and requested refill for lancets and testing supplies.   Patient Goals/Self-Care Activities Over the next 90 days, patient will:  take medications as prescribed,  check glucose daily (at varying times of day), document, and provide at future appointments Will use Libre 3 sensor for 14 days to check blood glucose trends  collaborate with provider on medication access solutions, and  engage in dietary modifications by limiting in take of high carbohydrate foods continue pravastatin 469mfor heart health   Follow Up Plan: Telephone follow up appointment with care management team member scheduled for:  2 to 4 weeks         Medication Assistance: None required.  Patient affirms current coverage meets needs.  [facility]  [facility] [address]one: [phone]: [phone] [place]noka[address]1696789 [address] [place] [address]Jeff[address] HeightsRS[address]C[place]hone: [phone]: [phone] (OpDan[place]- CaGranadaCASergeant BluffoP[facility]ville[address] CaWaconia[address]hone: [phone]: [phone] Asc LLC D[facility]lc [facility] Centerelivery ([facility]) - [place]vFrostproofKSOcean [place] [address]hone: [phone]:  [phone] Follow Up:  Patient agrees to Care Plan and Follow-up.  Plan: Telephone follow up appointment with care management team member scheduled for:  2 to 4 week  TaCherre RobinsPharmD Clinical Pharmacist [facility]St. [facility]more[facility] MeBoliviaiGlen Rose [facility] Center

## 2022-06-14 NOTE — Patient Instructions (Signed)
Mrs. Alcorta It was a pleasure speaking with you today.  Below is a summary of your health goals and summary of our recent visit. You can also view your updated Chronic Care Management Care plan through your MyChart account.   Patient Goals/Self-Care Activities take medications as prescribed,  check glucose daily (at varying times of day), document, and provide at future appointments Will use Libre 3 sensor for 14 days to check blood glucose trends  collaborate with provider on medication access solutions, and  engage in dietary modifications by limiting in take of high carbohydrate foods continue pravastatin '40mg'$  for heart health   As always if you have any questions or concerns especially regarding medications, please feel free to contact me either at the phone number below or with a MyChart message.   Keep up the good work!  [name], PharmD Clinical Pharmacist [place] [phone] (direct line)  [phone] (main office number)   The patient verbalized understanding of instructions, educational materials, and care plan provided today and DECLINED offer to receive copy of patient instructions, educational materials, and care plan.

## 2022-06-17 ENCOUNTER — Encounter (INDEPENDENT_AMBULATORY_CARE_PROVIDER_SITE_OTHER): Payer: Self-pay

## 2022-06-21 ENCOUNTER — Ambulatory Visit: Payer: Medicare Other | Admitting: Pharmacist

## 2022-06-21 DIAGNOSIS — E1165 Type 2 diabetes mellitus with hyperglycemia: Secondary | ICD-10-CM

## 2022-06-21 DIAGNOSIS — F411 Generalized anxiety disorder: Secondary | ICD-10-CM

## 2022-06-21 NOTE — Patient Instructions (Signed)
Mrs. Erb It was a pleasure speaking with you today.  Below is a summary of your health goals and summary of our recent visit. You can also view your updated Chronic Care Management Care plan through your MyChart account.   Patient Goals/Self-Care Activities take medications as prescribed,  check glucose daily (at varying times of day), document, and provide at future appointments Will use Libre 3 sensor for 14 days to check blood glucose trends  collaborate with provider on medication access solutions, and  engage in dietary modifications by limiting in take of high carbohydrate foods continue pravastatin '40mg'$  for heart health   As always if you have any questions or concerns especially regarding medications, please feel free to contact me either at the phone number below or with a MyChart message.   Keep up the good work!  [name], PharmD Clinical Pharmacist [place] [phone] (direct line)  [phone] (main office number)   The patient verbalized understanding of instructions, educational materials, and care plan provided today and DECLINED offer to receive copy of patient instructions, educational materials, and care plan.

## 2022-06-21 NOTE — Chronic Care Management (AMB) (Signed)
Chronic Care Management Pharmacy Note  06/21/2022 Name:  Debra [name] MRN:  [id number] DOB:  1956/09/14  Summary: Medication Management: Reviewed refill history and discussed recent medication administration. Patient has been more adherent to medication regimen recently Diabetes: Patient continues to be concerned about blood glucose and diet. Reviewed last 7 days of [employer] report. (See below)  CGM Documentation:  Days Worn: 7 (recommend 14 days) % Time CGM is active: 49% (goal ?70%) Average Glucose: 120 mg/dL Glucose Management Indicator: 6.2 Glucose Variability: 23.3% (goal <36%) Time in Range:  - Time above range >250: 0% (typical goal: <5%) - Time above range 181-250: 3% (typical goal <20%) - Time in range 70-180 97% (typical goal ?70%) - Time below range 54-69: 0% (typical goal <4%) - Time below range: 0 (typical goal <1%)  Had a few readings during night that caused sensor to alert to trending low. Patient has 1 low around noon 06/20/2022. She reports that she slept late and skipped breakfast.   She is taking metformin $RemoveBeforeDE'500mg'nrtCNHPZTdTbHGo$  daily. Discussed eating small meals with snacks if needed but that she does not have to eat snacks unless she is having lows between meals. She previously felt like she might be having highs sometimes at work - feels sluggish and get headache. So we placed a sample Continuous Glucose Monitor last week 06/14/2022.  She will continue to wear Continuous Glucose Monitor for 7 more days. Will see patient in office to review report. Reminded her to keep a food diary to help with interruptering CBG report. Recommended she try to have a 15 to 20 gram snack at bedtime to prevent lows during night (graham crackers with a little peanut butter, plain yogut, small piece of fruit + palmful of almonds.   Reviewed blood glucose goals. Patient will meet with dietician again 07/2022. Fasting blood glucose goal (before meals) = 80 to 130 Blood glucose goal after a meal  = less than 180   Depression/Anxiety: Patient reports improvement in depression and stress since starting higher dose of paroxetine (increased from 30 to $Rem'40mg'fueQ$  daily). She met with social worker with [facility] and has follow up 06/25/2022  Freestyle Libre 3 placed 06/14/2022 - Lot #[id number] / Exp: 09/26/2022  Subjective: Debra [name] is an 66 y.o. year old female who is a primary patient of Ann Held, DO.  The CCM team was consulted for assistance with disease management and care coordination needs.    Engaged with patient by telephone for follow up visit in response to provider referral for pharmacy case management and/or care coordination services.   Consent to Services:  The patient was given information about Chronic Care Management services, agreed to services, and gave verbal consent prior to initiation of services.  Please see initial visit note for detailed documentation.   Patient Care Team: Carollee Herter, Alferd Apa, DO as PCP - General Buford Dresser, MD as PCP - Cardiology (Cardiology) Leeroy Cha, MD as Consulting Physician (Neurosurgery) Chesley Mires, MD as Consulting Physician (Pulmonary Disease) [name], RPH-CPP (Pharmacist) Shea Evans Norva Riffle, LCSW as [facility] (Licensed Clinical Social Worker)  Recent office visits: 01/01/2022 - PCP (Dr Carollee Herter) patient seen for follow up chronic conditions. Unable to afford vesicare / generic. Changed to oxybutynin $RemoveBefor'10mg'zIFMKRGXzSJF$  at bedtime. Added azelastin 0.1% nasal spray - 2 sprays in both nostrils twice a day.  F/U 6 months.   Recent consult visits: 05/28/2022 - Dietician Tyrone Nine, RD) Patient Instructions:  Aim to have 3  meals + snacks daily.  Remember to eat during the week. See meal guide created. F/U 2 months.  03/05/2022 - Dietician Tyrone Nine, RD) Diabetes nutrition education.  12/20/2021 - Ortho (Dr Tempie Donning) Seen for right trigger thumb. Discussed repeat corticosteroid injection  versus open A1 pulley release.  She is not interested in surgery at this point and would like to try one more injection. Received injection in right thumb. 10/10/2021 - Wound Care ([facility]) seen for wound over the right posterior foot non healing since November 2022. Wound was debrided. Provided wound care instructions.   Hospital visits: None in previous 6 months  Objective:    Lab Results  Component Value Date   CREATININE 0.78 01/01/2022   CREATININE 0.80 08/30/2021   CREATININE 0.80 05/25/2021    Lab Results  Component Value Date   HGBA1C 6.8 (H) 01/01/2022   Last diabetic Eye exam:  Lab Results  Component Value Date/Time   HMDIABEYEEXA No Retinopathy 02/06/2022 12:00 AM    Last diabetic Foot exam: No results found for: "HMDIABFOOTEX"      Component Value Date/Time   CHOL 183 01/01/2022 1752   CHOL 206 (H) 07/22/2018 1057   TRIG 185.0 (H) 01/01/2022 1752   HDL 51.80 01/01/2022 1752   HDL 48 07/22/2018 1057   CHOLHDL 4 01/01/2022 1752   VLDL 37.0 01/01/2022 1752   LDLCALC 94 01/01/2022 1752   LDLCALC 102 (H) 04/20/2021 1415   LDLDIRECT 126.3 06/06/2011 1533       Latest Ref Rng & Units 01/01/2022    5:52 PM 08/30/2021    4:00 PM 05/25/2021    2:33 PM  Hepatic Function  Total Protein 6.0 - 8.3 g/dL 7.5  8.3  7.8   Albumin 3.5 - 5.2 g/dL 4.0  4.3    AST 0 - 37 U/L $Remo'20  20  21   'cezLU$ ALT 0 - 35 U/L $Remo'12  10  14   'BZvbv$ Alk Phosphatase 39 - 117 U/L 89  82    Total Bilirubin 0.2 - 1.2 mg/dL 0.4  0.5  0.5     Lab Results  Component Value Date/Time   TSH 1.32 01/01/2022 05:52 PM   TSH 1.26 05/25/2021 02:33 PM   FREET4 0.55 (L) 10/09/2020 02:44 PM   FREET4 0.82 07/22/2018 10:57 AM       Latest Ref Rng & Units 05/25/2021    2:33 PM 11/24/2020    2:31 PM 10/05/2020    3:54 PM  CBC  WBC 3.8 - 10.8 Thousand/uL 5.8  6.4  5.2   Hemoglobin 11.7 - 15.5 g/dL 11.6  11.1  11.4   Hematocrit 35.0 - 45.0 % 35.4  33.5  34.3   Platelets 140 - 400 Thousand/uL 260  239  276.0      Lab Results  Component Value Date/Time   VD25OH 33.57 02/27/2021 02:18 PM   VD25OH 36.68 10/05/2020 03:54 PM    Clinical ASCVD: No  The 10-year ASCVD risk score (Arnett DK, et al., 2019) is: 27.2%   Values used to calculate the score:     Age: 73 years     Sex: Female     Is Non-Hispanic African American: Yes     Diabetic: Yes     Tobacco smoker: Yes     Systolic Blood Pressure: 161 mmHg     Is BP treated: Yes     HDL Cholesterol: 51.8 mg/dL     Total Cholesterol: 183 mg/dL      Social History  Tobacco Use  Smoking Status Every Day   Packs/day: 0.75   Years: 44.00   Total pack years: 33.00   Types: Cigarettes  Smokeless Tobacco Never   BP Readings from Last 3 Encounters:  06/14/22 110/70  01/01/22 138/78  10/04/21 118/88   Pulse Readings from Last 3 Encounters:  06/14/22 71  10/04/21 69  09/27/21 79   Wt Readings from Last 3 Encounters:  01/01/22 164 lb 9.6 oz (74.7 kg)  10/04/21 172 lb 6.4 oz (78.2 kg)  09/27/21 174 lb 9.6 oz (79.2 kg)    Assessment: Review of patient past medical history, allergies, medications, health status, including review of consultants reports, laboratory and other test data, was performed as part of comprehensive evaluation and provision of chronic care management services.   SDOH:  (Social Determinants of Health) assessments and interventions performed:     CCM Care Plan  Allergies  Allergen Reactions   Penicillins    Codeine Rash and Other (See Comments)    dizziness    Medications Reviewed Today     Reviewed by Henrene Pastor, RPH-CPP (Pharmacist) on 06/21/22 at 1027  Med List Status: <None>   Medication Order Taking? Sig Documenting Provider Last Dose Status Informant  0.9 %  sodium chloride infusion [id number]   Meryl Dare, MD  Active   Accu-Chek Softclix Lancets lancets [id number] Yes Use to check blood glucose once a day (DX: type 2 DM E11.65) Zola Button, Grayling Congress, DO Taking Active   azelastine (ASTELIN)  0.1 % nasal spray [id number] Yes Place 2 sprays into both nostrils 2 (two) times daily. Use in each nostril as directed [name], DO Taking Active   blood glucose meter kit and supplies KIT [id number] Yes Dispense based on patient and insurance preference. Use up to four times daily as directed. (FOR ICD-9 250.00, 250.01). Seabron Spates R, DO Taking Active   Blood Glucose Monitoring Suppl (ACCU-CHEK GUIDE) w/Device KIT [id number] Yes Use to check blood glucose daily (DX: type 2 DM E 11.65) [name], DO Taking Active   gabapentin (NEURONTIN) 600 MG tablet [id number] Yes TAKE 1 TABLET BY MOUTH 3  TIMES DAILY [name], DO Taking Active            Med Note Clydie Braun, Demichael Traum B   Wed Feb 27, 2022  3:49 PM) Taking 1 or 2 times a day as needed  glucose blood (ACCU-CHEK GUIDE) test strip [id number] Yes Use to check blood glucose once a day (Dx: type 2 DM / E11.65) Zola Button, Grayling Congress, DO Taking Active   hydrochlorothiazide (HYDRODIURIL) 25 MG tablet [id number] Yes TAKE 1 TABLET BY MOUTH DAILY [name], DO Taking Active   levothyroxine (SYNTHROID) 50 MCG tablet [id number] Yes TAKE 1 TABLET BY MOUTH DAILY Zola Button, Grayling Congress, DO Taking Active   losartan (COZAAR) 100 MG tablet [id number] Yes TAKE 1 TABLET BY MOUTH ONCE  DAILY Zola Button, Grayling Congress, DO Taking Active   metFORMIN (GLUCOPHAGE) 500 MG tablet [id number] Yes TAKE 1 TABLET BY MOUTH DAILY  WITH BREAKFAST Seabron Spates R, DO Taking Active   metoprolol succinate (TOPROL-XL) 100 MG 24 hr tablet [id number] Yes Take 1 tablet (100 mg total) by mouth daily. Take with or immediately following a meal. Zola Button, Grayling Congress, DO Taking Active   montelukast (SINGULAIR) 10 MG tablet [id number] Yes Take 1 tablet (10 mg total) by mouth at bedtime. [name], DO Taking Active  NON FORMULARY [id number] Yes [facility]  Scar Cream -  Verapamil 10%, Pentoxifylline 5% Apply 1-2 grams to affected area 3-4  times daily Qty. 120 gm 3 refills [provider] Taking Active   oxybutynin (DITROPAN-XL) 10 MG 24 hr tablet [id number] Yes Take 1 tablet (10 mg total) by mouth at bedtime. Ann Held, DO Taking Active     Discontinued 01/17/22 1530 (Reorder)   PARoxetine (PAXIL) 40 MG tablet [id number] Yes Take 1 tablet (40 mg total) by mouth every morning. Ann Held, DO Taking Active   pravastatin (PRAVACHOL) 40 MG tablet [id number] Yes Take 1 tablet (40 mg total) by mouth daily. For lowering cholesterol and heart health Ann Held, DO Taking Active             Patient Active Problem List   Diagnosis Date Noted   Snapping lateral band due to ligament laxity 02/19/2022   Moderate episode of recurrent major depressive disorder ([place]) 01/01/2022   Polyuria 01/01/2022   Mixed stress and urge urinary incontinence 01/01/2022   Wound infection 10/04/2021   Wound of right ankle 09/27/2021   Trigger thumb, right thumb 09/13/2021   Pruritic dermatitis 05/25/2021   Vaginal discharge 05/25/2021   Balance disorder 05/25/2021   Dizziness 05/25/2021   Osteoarthritis    Vitamin D deficiency    Urinary frequency 10/31/2020   Chronic pain of both shoulders 10/31/2020   Female pattern baldness 10/06/2020   Myalgia 10/06/2020   Urge incontinence of urine 10/06/2020   History of diabetes mellitus, type II 01/11/2020   Allergies 02/03/2019   Acute vaginitis 04/22/2018   Morbid obesity 04/22/2018   Generalized anxiety disorder 04/22/2018   Obstructive sleep apnea 01/30/2018   Major depressive disorder 01/30/2018   Pansinusitis 01/30/2018   Aortic atherosclerosis 10/10/2017   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   Hyperlipidemia 04/17/2017   Anemia, iron deficiency 04/17/2017   Atypical chest pain 08/31/2014   Arm weakness 08/31/2014   Hearing loss 07/24/2012   Hypothyroidism 11/02/2010   Essential hypertension 08/31/2010   Hyperglycemia, fasting 96/78/9381   Diastolic  dysfunction 01/75/1025   Back pain with radiculopathy 01/25/2010   Fatigue 01/25/2010   Tobacco abuse 12/01/2008   Restless legs syndrome 12/01/2008   Leg pain, bilateral 11/17/2008   Palpitations 02/12/2008   GERD (gastroesophageal reflux disease) 07/16/2007   Hiatal hernia 07/16/2007   Knee pain 07/16/2007   Low back pain 07/16/2007   Peripheral vertigo 04/30/2007   Insomnia 04/30/2007   Abdominal pain, right upper quadrant 04/30/2007    Immunization History  Administered Date(s) Administered   Fluad Quad(high Dose 65+) 08/30/2021   Influenza Whole 10/01/2007, 08/31/2010   Influenza,inj,Quad PF,6+ Mos 10/12/2013, 08/29/2015, 08/13/2016, 08/12/2017, 08/03/2018   Influenza-Unspecified 08/10/2019   PFIZER Comirnaty(Gray Top)Covid-19 Tri-Sucrose Vaccine 02/27/2021   PFIZER(Purple Top)SARS-COV-2 Vaccination 01/11/2020, 01/31/2020, 08/24/2020   Pfizer Covid-19 Vaccine Bivalent Booster 90yrs & up 08/29/2021   Pneumococcal Polysaccharide-23 02/27/2021   Td 11/02/2010, 09/27/2021   Tdap 08/03/2018   Zoster Recombinat (Shingrix) 04/17/2017, 06/17/2017   Zoster, Live 03/12/2016    Conditions to be addressed/monitored: HTN, HLD, DMII, Anxiety, Depression, GERD, Tobacco Use, and stress / urge incontinence  Care Plan : General Pharmacy (Adult)  Updates made by [name], RPH-CPP since 06/21/2022 12:00 AM     Problem: Chronic Conditions: type 2 DM; HTN; HDL; OSA; depression / GAD; stress / urge incontinence; GERD; hypothyroidism; neuropathy; aortic atherosclerosis   Priority: High  Onset Date: 01/17/2022     Long-Range Goal:  Provide education, support and care coordination for medication therapy and chronic conditions   Start Date: 01/17/2022  Recent Progress: On track  Priority: High  Note:   Current Barriers:  Unable to independently monitor therapeutic efficacy Unable to achieve control of hyperlipidemia  Unable to maintain control of stress/ urge incontinence Does not  adhere to prescribed medication regimen Education needed regarding type 2 DM, goals and diet Increase in depression symptoms  Pharmacist Clinical Goal(s):  Over the next 90 days, patient will achieve adherence to monitoring guidelines and medication adherence to achieve therapeutic efficacy achieve control of hyperlipidemia as evidenced by LDL < 70 maintain control of type 2 DM as evidenced by A1c < 7.0%  adhere to prescribed medication regimen as evidenced by refill history Demonstrate knowledge needed to adequately monitor and manage type 2 DM  through collaboration with PharmD and provider.  Coordinate with provider and social worker for treatment of depression. Goal is to decrease number of day with depression symptoms.  Interventions: 1:1 collaboration with Carollee Herter, Alferd Apa, DO regarding development and update of comprehensive plan of care as evidenced by provider attestation and co-signature Inter-disciplinary care team collaboration (see longitudinal plan of care) Comprehensive medication review performed; medication list updated in electronic medical record  Diabetes: Controlled; A1c goal < 7.0% Current treatment: Metformin 500mg  - take 1 tablet daily Current glucose readings: ranges form 118 to 144 per patient.  Occasionally has dizziness which she is not sure is related to low blood glucose or not.  Complications: neuropathy, aortic atherosclerosis Current meal patterns: improved foods choices and trying to limit intake of high carbohydrate / sugar foods; patient has lots of questions about food effect on blood glucose.  Last dietician visit was 05/2022 Interventions:  Educated on diet and diabetes; Reviewed which food increase blood glucose the most and about limiting frequency and serving sizes of these foods. Continue to follow up with dietician as planned.  Continue to checking blood glucose daily at varying times of day (both before and after meals).  06/14/2022  placed 14 day Continuous Glucose Monitor to check blood glucose trends to identify possible hypo and hyperglycemia Reviewed home blood glucose readings and reviewed goals  Fasting blood glucose goal (before meals) = 80 to 130 Blood glucose goal after a meal = less than 180  Reminded patient to schedule annual eye exam  Hypertension: Controlled; blood pressure goal <140/90 BP Readings from Last 3 Encounters:  06/14/22 110/70  01/01/22 138/78  10/04/21 118/88  current treatment: Hydrochlorothiazide 25mg  - take 1 tablet a day Losartan 100mg  daily Metoprolol succinate 100mg  - take 1 tablet daily  Current home readings: not checking at home Denies hypotensive/hypertensive symptoms Interventions:  Educated on benefits of blood pressure control for prevention of heart and kidney disease Recommended continue current medications for hypertension   Hyperlipidemia: Uncontrolled; LDL goal is < 70 and triglycerides < 150 Lab Results  Component Value Date   CHOL 183 01/01/2022   HDL 51.80 01/01/2022   LDLCALC 94 01/01/2022   LDLDIRECT 126.3 06/06/2011   TRIG 185.0 (H) 01/01/2022   CHOLHDL 4 01/01/2022  Current treatment: Pravastatin 40mg  daily (last refilled 01/17/2022) Medications previously tried: pravastatin 20mg  - ineffective  Patient states she does not need refil of pravastatin for another 1 to 2 weeks. Asked if she ever forgets to take pravastatin as it should be due for refill this week and she admits that she does miss evening dose about 2 times per week. She is using pill container.  Interventions:  Educated on lipid goals Counseled on benefits of statin therapy Recommended continue pravastatin 40mg      Depression/Anxiety Improved since dose of paroxetine increased 03/2022 Recent increase in activity. Happy about son's recent marriage.  Goal: decrease number of days per month with symptoms of depression or anxiety    05/21/2022   12:06 PM 04/17/2022    4:06 PM 03/05/2022    11:33 AM  PHQ9 SCORE ONLY  PHQ-9 Total Score 8 12 0  Current treatment: Paroxetine 40mg  daily Interventions:  Discussed importance of adherence with treatment to get most benefits Continue to meet with social worker for stress management.   Stress / Urge Incontinence:  Improving with oxybutynin but still having some incontinence Was prescribed Vesicare but copay of too high Attended 1 session of pelvic PT for urinary incontinence.  Interventions: Encouraged her to reschedule appointment for pelvic floor rehab Continue oxybutynin - sent in updated prescription  Medication management Pharmacist Clinical Goal(s): Over the next 90 days, patient will work with PharmD and providers to maintain optimal medication adherence Current pharmacy: [facility] Interventions Comprehensive medication review performed. Reviewed refill history and assessed adherence Continue current medication management strategy Updated prescription for metoprolol succinate 100mg  ER and requested refill for lancets and testing supplies.   Patient Goals/Self-Care Activities Over the next 90 days, patient will:  take medications as prescribed,  check glucose daily (at varying times of day), document, and provide at future appointments Will use Libre 3 sensor for 14 days to check blood glucose trends  collaborate with provider on medication access solutions, and  engage in dietary modifications by limiting in take of high carbohydrate foods continue pravastatin 40mg  for heart health   Follow Up Plan: Telephone follow up appointment with care management team member scheduled for:  2 to 4 weeks          Medication Assistance: None required.  Patient affirms current coverage meets needs.  Patient's preferred pharmacy is:  [id number] [address] [place], [place] - [address] [address] Phone: [phone] Fax: [phone]  [facility]  [id number] [place], [place] - [address] [place] [address] Phone: [phone] Fax: [phone]  [facility] - [place], [place] [facility] [address] [place] [address] [place] [address] Phone: [phone] Fax: [phone]  [facility] - [place], [place] [address] Phone: [phone] Fax: [phone]   Follow Up:  Patient agrees to Care Plan and Follow-up.  Plan: Telephone follow up appointment with care management team member scheduled for:  1 week  [name], PharmD Clinical Pharmacist [facility] [place] [facility]

## 2022-06-24 ENCOUNTER — Other Ambulatory Visit: Payer: Self-pay | Admitting: Family Medicine

## 2022-06-25 ENCOUNTER — Ambulatory Visit: Payer: Self-pay | Admitting: Licensed Clinical Social Worker

## 2022-06-25 NOTE — Patient Instructions (Signed)
Visit Information  Thank you for taking time to visit with me today. Please don't hesitate to contact me if I can be of assistance to you before our next scheduled telephone appointment.  Following are the goals we discussed today:   Client to receive phone call from RN Thea Silversmith on 07/02/22 at 3:00 PM   Please call the care guide team at [phone] if you need to cancel or reschedule your appointment.   If you are experiencing a Mental Health or [place] or need someone to talk to, please go to [facility] [place] [phone])   Following is a copy of your full plan of care:   Care Coordination Interventions:  Active listening / Reflection utilized  Emotional Support Provided  Provided counseling support Spoke of job concerns of client. She is currently working at a job. Looking for a job where she could earn more money Client spoke of transportation needs Client spoke of concerns with managing Diabetes and managing blood sugar levels. She is currently having BS test for 14 days with CGM use.  Discussed pain issues of client. Client said she smokes currently. Reviewed stress issues of client Client would like to have RN call her to discuss food procurement of client and how to buy healthy foods for client  Ms. Gonsalves was given information about Care Management services by the embedded care coordination team including:  Care Management services include personalized support from designated clinical staff supervised by her physician, including individualized plan of care and coordination with other care providers 24/7 contact phone numbers for assistance for urgent and routine care needs. The patient may stop CCM services at any time (effective at the end of the month) by phone call to the office staff.  Patient agreed to services and verbal consent obtained.   Norva Riffle.Aarion Kittrell MSW, [place] [facility] Care Management [phone]

## 2022-06-25 NOTE — Patient Outreach (Signed)
  Care Coordination   Follow Up Visit Note   06/25/2022 Name: Debra [name] MRN: [id number] DOB: Jul 07, 1956  Debra [name] is a 66 y.o. year old female who sees Carollee Herter, Alferd Apa, DO for primary care. I spoke with  Debra [name] by phone today.  What matters to the patients health and wellness today?  Client concerned about managing Diabetes . Client concerned about transport issues    Goals Addressed             This Visit's Progress    Patient is concerned about job issues; she is concerned about transport issues.       Care Coordination Interventions:  Active listening / Reflection utilized  Emotional Support Provided  Provided counseling support Spoke of job concerns of client. She is currently working at a job. Looking for a job where she could earn more money Client spoke of transportation needs Client spoke of concerns with managing Diabetes and managing blood sugar levels. She is currently having BS test for 14 days with CGM use.  Discussed pain issues of client. Client said she smokes currently. Reviewed stress issues of client Client would like to have RN call her to discuss food procurement of client and how to buy healthy foods for client    SDOH assessments and interventions completed:  Yes   Care Coordination Interventions Activated:  Yes  Care Coordination Interventions:  Yes, provided   Follow up plan: Follow up call scheduled for client with RN Thea Silversmith on 07/02/22 at 3:00 PM     Encounter Outcome:  Pt. Visit Completed

## 2022-06-27 DIAGNOSIS — E785 Hyperlipidemia, unspecified: Secondary | ICD-10-CM

## 2022-06-27 DIAGNOSIS — E1169 Type 2 diabetes mellitus with other specified complication: Secondary | ICD-10-CM

## 2022-06-27 DIAGNOSIS — I10 Essential (primary) hypertension: Secondary | ICD-10-CM

## 2022-06-27 DIAGNOSIS — F32A Depression, unspecified: Secondary | ICD-10-CM | POA: Diagnosis not present

## 2022-07-02 ENCOUNTER — Ambulatory Visit: Payer: Self-pay

## 2022-07-02 DIAGNOSIS — Z8639 Personal history of other endocrine, nutritional and metabolic disease: Secondary | ICD-10-CM

## 2022-07-02 NOTE — Patient Outreach (Signed)
  Care Coordination   Follow Up Visit Note   07/02/2022 Name: Debra [name] MRN: [id number] DOB: 08/17/1956  Debra [name] is a 66 y.o. year old female who sees Carollee Herter, Alferd Apa, DO for primary care. I spoke with  Debra [name] by phone today.  What matters to the patients health and wellness today?  Diabetes management    Goals Addressed             This Visit's Progress    Care coordination: Diabetes management       Care Coordination Interventions: Evaluation of current treatment plan related to diabetes and patient's adherence to plan as established by provider Advised patient to attend upcoming provider appointment with clinical pharmacist, primary care provider and nutritionist Assessed social determinant of health barriers Discussed importance of eating healthy and encouraged to follow up with dietician appointment. Referral made to community resources care guide team for assistance with financial strain with utilities(heating/water) and transportation resources Reviewed Blood sugar target goals with patient: 80-130 before meals and <180 about 2 hour after a meal.        SDOH assessments and interventions completed:  Yes  SDOH Interventions Today    Flowsheet Row Most Recent Value  SDOH Interventions   Food Insecurity Interventions Intervention Not Indicated  Housing Interventions Intervention Not Indicated  Transportation Interventions Intervention Not Indicated  Utilities Interventions Intervention Not Indicated  Financial Strain Interventions Other (Comment)  [care guide referral]        Care Coordination Interventions Activated:  Yes  Care Coordination Interventions:  Yes, provided   Follow up plan: Follow up call scheduled for 08/06/22    Encounter Outcome:  Pt. Visit Completed   Thea Silversmith, RN, MSN, BSN, [place] Coordinator [phone]

## 2022-07-02 NOTE — Patient Instructions (Signed)
Visit Information  Thank you for taking time to visit with me today. Please don't hesitate to contact me if I can be of assistance to you.   Following are the goals we discussed today:   Goals Addressed             This Visit's Progress    Care coordination: Diabetes management       Care Coordination Interventions: Evaluation of current treatment plan related to diabetes and patient's adherence to plan as established by provider Advised patient to attend upcoming provider appointment with clinical pharmacist, primary care provider and nutritionist Assessed social determinant of health barriers Discussed importance of eating healthy and encouraged to follow up with dietician appointment. Referral made to community resources care guide team for assistance with financial strain with utilities(heating/water) and transportation resources Reviewed Blood sugar target goals with patient: 80-130 before meals and <180 about 2 hour after a meal.        Our next appointment is by telephone on 08/06/22 at 2:00 pm  Please call the care guide team at [phone] if you need to cancel or reschedule your appointment.   If you are experiencing a Mental Health or [place] or need someone to talk to, please call the Suicide and Crisis Lifeline: 988  Patient verbalizes understanding of instructions and care plan provided today and agrees to view in [place]. Active MyChart status and patient understanding of how to access instructions and care plan via MyChart confirmed with patient.     Thea Silversmith, RN, MSN, BSN, CCM Care Coordinator [phone]

## 2022-07-03 ENCOUNTER — Ambulatory Visit (INDEPENDENT_AMBULATORY_CARE_PROVIDER_SITE_OTHER): Payer: Medicare Other | Admitting: Pharmacist

## 2022-07-03 ENCOUNTER — Telehealth: Payer: Self-pay

## 2022-07-03 DIAGNOSIS — E1169 Type 2 diabetes mellitus with other specified complication: Secondary | ICD-10-CM

## 2022-07-03 DIAGNOSIS — E1165 Type 2 diabetes mellitus with hyperglycemia: Secondary | ICD-10-CM

## 2022-07-03 DIAGNOSIS — I10 Essential (primary) hypertension: Secondary | ICD-10-CM

## 2022-07-03 MED ORDER — NICOTINE 14 MG/24HR TD PT24: 14.0000 mg | MEDICATED_PATCH | Freq: Every day | TRANSDERMAL | 0 refills | Status: DC

## 2022-07-03 MED ORDER — FREESTYLE LIBRE 3 SENSOR MISC: 2 refills | Status: DC

## 2022-07-03 NOTE — Telephone Encounter (Signed)
   Telephone encounter was:  Unsuccessful.  07/03/2022 Name: Debra [name] MRN: [id number] DOB: [birth year]  Unsuccessful outbound call made today to assist with:  Food Insecurity and Financial Difficulties related to financial strain  Outreach Attempt:  1st Attempt Unable to [place]  [phone] [address], [place], [place] [address] Phone: [phone] Email: [email or web address]

## 2022-07-03 NOTE — Patient Instructions (Addendum)
Mrs. Armenteros It was a pleasure speaking with you today.  Below is a summary of your health goals and summary of our recent visit. You can also view your updated Chronic Care Management Care plan through your MyChart account.   Start Nicotine patch '14mg'$  - appy 1 patch daily. Removed and apply new patch each morning.  Try calling  Quit Line 1-800-Quit-Now 916 206 5764)  As always if you have any questions or concerns especially regarding medications, please feel free to contact me either at the phone number below or with a MyChart message.   Keep up the good work!  [name], PharmD Clinical Pharmacist [facility] [phone] (direct line)  [phone] (main office number)    Managing the Challenge of Quitting Smoking Quitting smoking is a physical and mental challenge. You may have cravings, withdrawal symptoms, and temptation to smoke. Before quitting, work with your health care provider to make a plan that can help you manage quitting. Making a plan before you quit may keep you from smoking when you have the urge to smoke while trying to quit. How to manage lifestyle changes Managing stress Stress can make you want to smoke, and wanting to smoke may cause stress. It is important to find ways to manage your stress. You could try some of the following: Practice relaxation techniques. Breathe slowly and deeply, in through your nose and out through your mouth. Listen to music. Soak in a bath or take a shower. Imagine a peaceful place or vacation. Get some support. Talk with family or friends about your stress. Join a support group. Talk with a counselor or therapist. Get some physical activity. Go for a walk, run, or bike ride. Play a favorite sport. Practice yoga.  Medicines Talk with your health care provider about medicines that might help you deal with cravings and make quitting easier for you. Relationships Social situations can be  difficult when you are quitting smoking. To manage this, you can: Avoid parties and other social situations where people might be smoking. Avoid alcohol. Leave right away if you have the urge to smoke. Explain to your family and friends that you are quitting smoking. Ask for support and let them know you might be a bit grumpy. Plan activities where smoking is not an option. General instructions Be aware that many people gain weight after they quit smoking. However, not everyone does. To keep from gaining weight, have a plan in place before you quit, and stick to the plan after you quit. Your plan should include: Eating healthy snacks. When you have a craving, it may help to: Eat popcorn, or try carrots, celery, or other cut vegetables. Chew sugar-free gum. Changing how you eat. Eat small portion sizes at meals. Eat 4-6 small meals throughout the day instead of 1-2 large meals a day. Be mindful when you eat. You should avoid watching television or doing other things that might distract you as you eat. Exercising regularly. Make time to exercise each day. If you do not have time for a long workout, do short bouts of exercise for 5-10 minutes several times a day. Do some form of strengthening exercise, such as weight lifting. Do some exercise that gets your heart beating and causes you to breathe deeply, such as walking fast, running, swimming, or biking. This is very important. Drinking plenty of water or other low-calorie or no-calorie drinks. Drink enough fluid to keep your urine pale yellow.  How to recognize withdrawal symptoms Your  body and mind may experience discomfort as you try to get used to not having nicotine in your system. These effects are called withdrawal symptoms. They may include: Feeling hungrier than normal. Having trouble concentrating. Feeling irritable or restless. Having trouble sleeping. Feeling depressed. Craving a cigarette. These symptoms may surprise you, but  they are normal to have when quitting smoking. To manage withdrawal symptoms: Avoid places, people, and activities that trigger your cravings. Remember why you want to quit. Get plenty of sleep. Avoid coffee and other drinks that contain caffeine. These may worsen some of your symptoms. How to manage cravings Come up with a plan for how to deal with your cravings. The plan should include the following: A definition of the specific situation you want to deal with. An activity or action you will take to replace smoking. A clear idea for how this action will help. The name of someone who could help you with this. Cravings usually last for 5-10 minutes. Consider taking the following actions to help you with your plan to deal with cravings: Keep your mouth busy. Chew sugar-free gum. Suck on hard candies or a straw. Brush your teeth. Keep your hands and body busy. Change to a different activity right away. Squeeze or play with a ball. Do an activity or a hobby, such as making bead jewelry, practicing needlepoint, or working with wood. Mix up your normal routine. Take a short exercise break. Go for a quick walk, or run up and down stairs. Focus on doing something kind or helpful for someone else. Call a friend or family member to talk during a craving. Join a support group. Contact a quitline. Where to find support To get help or find a support group: Call the [facility]'s Smoking Quitline: 1-800-QUIT-NOW 669-461-7080) Text QUIT to SmokefreeTXT: 454098 Where to find more information Visit these websites to find more information on quitting smoking: U.S. Department of Health and Human Services: www.smokefree.gov American Lung Association: www.freedomfromsmoking.org Centers for Disease Control and Prevention (CDC): http://www.wolf.info/ American Heart Association: www.heart.org Contact a health care provider if: You want to change your plan for quitting. The medicines you are taking  are not helping. Your eating feels out of control or you cannot sleep. You feel depressed or become very anxious. Summary Quitting smoking is a physical and mental challenge. You will face cravings, withdrawal symptoms, and temptation to smoke again. Preparation can help you as you go through these challenges. Try different techniques to manage stress, handle social situations, and prevent weight gain. You can deal with cravings by keeping your mouth busy (such as by chewing gum), keeping your hands and body busy, calling family or friends, or contacting a quitline for people who want to quit smoking. You can deal with withdrawal symptoms by avoiding places where people smoke, getting plenty of rest, and avoiding drinks that contain caffeine. This information is not intended to replace advice given to you by your health care provider. Make sure you discuss any questions you have with your health care provider. Document Revised: 10/05/2021 Document Reviewed: 10/05/2021 Elsevier Patient Education  [place] copy of patient instructions, educational materials, and care plan provided in person.

## 2022-07-03 NOTE — Chronic Care Management (AMB) (Signed)
Chronic Care Management Pharmacy Note  07/03/2022 Name:  Debra QUANT MRN:  [id number] DOB:  November 24, 1955  Summary: Medication Management: Reviewed refill history and discussed recent medication administration. Patient has been more adherent to medication regimen recently Diabetes: Patient continues to be concerned about blood glucose and diet. Reviewed 14 days of [facility] report. (See below)  CGM Documentation:  Days Worn: 14 % Time CGM is active: 98% (goal ?70%) Average Glucose: 120 mg/dL Glucose Management Indicator: 6.2 Glucose Variability: 21.4% (goal <36%) Time in Range:  - Time above range >250: 0% (typical goal: <5%) - Time above range 181-250: 2% (typical goal <20%) - Time in range 70-180 98% (typical goal ?70%) - Time below range 54-69: 0% (typical goal <4%) - Time below range: 0 (typical goal <1%)  Had about 9 readings during night that caused sensor to alert to trending low. Patient has 1 low around noon 06/20/2022. She reports that she slept late and skipped breakfast.  Also noted occasional post prandial excursions.   She is taking metformin $RemoveBeforeDE'500mg'XBgruDUukFADApH$  daily. Discussed eating small meals with snacks. Consider adding evening snack about 10 to 15 grams of carbohydrates that contains protein.  Patient interesting in weight loss. Discussed medication for diabetes that might help with blood glucose, weight and also has CVD benefits.  She would like to continue to use Continuous Glucose Monitor. Not sure if her insurance will allow Continuous Glucose Monitor. Rx sent to pharmacy.  Reviewed blood glucose goals. Patient will meet with dietician again 07/2022. Fasting blood glucose goal (before meals) = 80 to 130 Blood glucose goal after a meal = less than 180   Depression/Anxiety: Patient reports improvement in depression and stress since starting higher dose of paroxetine (increased from 30 to $Rem'40mg'Kkik$  daily). She met with Education officer, museum with [facility] in past.  Nicotine Use: Patient  reports she is interested in stopping smoking. She is currently smoking about 1/2 PPD. Started nicotine patches $RemoveBeforeDE'14mg'hMGgrAPhWnjntgV$  daily. Discussed tips for quitting smoking. Recommended she identify family / friends that will assist and encourage smoking cessation. Also discussed using Quit Line for counseling when she has a craving.   Subjective: Debra [name] is an 66 y.o. year old female who is a primary patient of Ann Held, DO.  The CCM team was consulted for assistance with disease management and care coordination needs.    Engaged with patient by telephone for follow up visit in response to provider referral for pharmacy case management and/or care coordination services.   Consent to Services:  The patient was given information about Chronic Care Management services, agreed to services, and gave verbal consent prior to initiation of services.  Please see initial visit note for detailed documentation.   Patient Care Team: Carollee Herter, Alferd Apa, DO as PCP - General Buford Dresser, MD as PCP - Cardiology (Cardiology) Leeroy Cha, MD as Consulting Physician (Neurosurgery) Chesley Mires, MD as Consulting Physician (Pulmonary Disease) [name], RPH-CPP (Pharmacist) Shea Evans Norva Riffle, LCSW as [place] Management (Licensed Clinical Social Worker)  Recent office visits: 01/01/2022 - PCP (Dr Carollee Herter) patient seen for follow up chronic conditions. Unable to afford vesicare / generic. Changed to oxybutynin $RemoveBefor'10mg'wUIiansSZMnd$  at bedtime. Added azelastin 0.1% nasal spray - 2 sprays in both nostrils twice a day.  F/U 6 months.   Recent consult visits: 05/28/2022 - Dietician Tyrone Nine, RD) Patient Instructions:  Aim to have 3 meals + snacks daily.  Remember to eat during the week. See meal guide  created. F/U 2 months.  03/05/2022 - Dietician Tyrone Nine, RD) Diabetes nutrition education.  12/20/2021 - Ortho (Dr Tempie Donning) Seen for right trigger thumb. Discussed repeat  corticosteroid injection versus open A1 pulley release.  She is not interested in surgery at this point and would like to try one more injection. Received injection in right thumb. 10/10/2021 - Wound Care ([place]) seen for wound over the right posterior foot non healing since November 2022. Wound was debrided. Provided wound care instructions.   Hospital visits: None in previous 6 months  Objective:    Lab Results  Component Value Date   CREATININE 0.78 01/01/2022   CREATININE 0.80 08/30/2021   CREATININE 0.80 05/25/2021    Lab Results  Component Value Date   HGBA1C 6.8 (H) 01/01/2022   Last diabetic Eye exam:  Lab Results  Component Value Date/Time   HMDIABEYEEXA No Retinopathy 02/06/2022 12:00 AM    Last diabetic Foot exam: No results found for: "HMDIABFOOTEX"      Component Value Date/Time   CHOL 183 01/01/2022 1752   CHOL 206 (H) 07/22/2018 1057   TRIG 185.0 (H) 01/01/2022 1752   HDL 51.80 01/01/2022 1752   HDL 48 07/22/2018 1057   CHOLHDL 4 01/01/2022 1752   VLDL 37.0 01/01/2022 1752   LDLCALC 94 01/01/2022 1752   LDLCALC 102 (H) 04/20/2021 1415   LDLDIRECT 126.3 06/06/2011 1533       Latest Ref Rng & Units 01/01/2022    5:52 PM 08/30/2021    4:00 PM 05/25/2021    2:33 PM  Hepatic Function  Total Protein 6.0 - 8.3 g/dL 7.5  8.3  7.8   Albumin 3.5 - 5.2 g/dL 4.0  4.3    AST 0 - 37 U/L $Remo'20  20  21   'EVThl$ ALT 0 - 35 U/L $Remo'12  10  14   'PBWeI$ Alk Phosphatase 39 - 117 U/L 89  82    Total Bilirubin 0.2 - 1.2 mg/dL 0.4  0.5  0.5     Lab Results  Component Value Date/Time   TSH 1.32 01/01/2022 05:52 PM   TSH 1.26 05/25/2021 02:33 PM   FREET4 0.55 (L) 10/09/2020 02:44 PM   FREET4 0.82 07/22/2018 10:57 AM       Latest Ref Rng & Units 05/25/2021    2:33 PM 11/24/2020    2:31 PM 10/05/2020    3:54 PM  CBC  WBC 3.8 - 10.8 Thousand/uL 5.8  6.4  5.2   Hemoglobin 11.7 - 15.5 g/dL 11.6  11.1  11.4   Hematocrit 35.0 - 45.0 % 35.4  33.5  34.3   Platelets 140 - 400  Thousand/uL 260  239  276.0     Lab Results  Component Value Date/Time   VD25OH 33.57 02/27/2021 02:18 PM   VD25OH 36.68 10/05/2020 03:54 PM    Clinical ASCVD: No  The 10-year ASCVD risk score (Arnett DK, et al., 2019) is: 27.2%   Values used to calculate the score:     Age: 7 years     Sex: Female     Is Non-Hispanic African American: Yes     Diabetic: Yes     Tobacco smoker: Yes     Systolic Blood Pressure: 768 mmHg     Is BP treated: Yes     HDL Cholesterol: 51.8 mg/dL     Total Cholesterol: 183 mg/dL      Social History   Tobacco Use  Smoking Status Every Day   Packs/day: 0.75  Years: 44.00   Total pack years: 33.00   Types: Cigarettes  Smokeless Tobacco Never   BP Readings from Last 3 Encounters:  06/14/22 110/70  01/01/22 138/78  10/04/21 118/88   Pulse Readings from Last 3 Encounters:  06/14/22 71  10/04/21 69  09/27/21 79   Wt Readings from Last 3 Encounters:  01/01/22 164 lb 9.6 oz (74.7 kg)  10/04/21 172 lb 6.4 oz (78.2 kg)  09/27/21 174 lb 9.6 oz (79.2 kg)    Assessment: Review of patient past medical history, allergies, medications, health status, including review of consultants reports, laboratory and other test data, was performed as part of comprehensive evaluation and provision of chronic care management services.   SDOH:  (Social Determinants of Health) assessments and interventions performed:  SDOH Interventions    [place] Coordination from 07/02/2022 in [place] Coordination from 05/21/2022 in [place] Management from 04/17/2022 in [place] at [place] from 12/28/2021 in [place] at [place] Visit from 09/21/2018 in [place] Office Visit from 09/09/2018 in [place]  SDOH Interventions        Food  Insecurity Interventions Intervention Not Indicated -- -- Intervention Not Indicated -- --  Housing Interventions Intervention Not Indicated -- -- Intervention Not Indicated -- --  Transportation Interventions Intervention Not Indicated -- -- Intervention Not Indicated -- --  Utilities Interventions Intervention Not Indicated -- -- -- -- --  Depression Interventions/Treatment  -- Counseling, Medication Currently on Treatment -- Currently on Treatment Currently on Treatment  Financial Strain Interventions Other (Comment)  [care guide referral] -- -- Intervention Not Indicated -- --  Physical Activity Interventions -- -- Other (Comments)  [recommend increase frequency of exercise] Intervention Not Indicated -- --  Stress Interventions -- -- -- Intervention Not Indicated -- --  Social Connections Interventions -- -- -- Intervention Not Indicated -- --        CCM Care Plan  Allergies  Allergen Reactions   Penicillins    Codeine Rash and Other (See Comments)    dizziness    Medications Reviewed Today     Reviewed by [name], RPH-CPP (Pharmacist) on 07/03/22 at 1644  Med List Status: <None>   Medication Order Taking? Sig Documenting Provider Last Dose Status Informant  0.9 %  sodium chloride infusion [id number]   Ladene Artist, MD  Active   Accu-Chek Softclix Lancets lancets [id number] Yes Use to check blood glucose once a day (DX: type 2 DM E11.65) Carollee Herter, Alferd Apa, DO Taking Active   azelastine (ASTELIN) 0.1 % nasal spray [id number] Yes Place 2 sprays into both nostrils 2 (two) times daily. Use in each nostril as directed Ann Held, DO Taking Active   blood glucose meter kit and supplies KIT [id number] Yes Dispense based on patient and insurance preference. Use up to four times daily as directed. (FOR ICD-9 250.00, 250.01). Roma Schanz R, DO Taking Active   Blood Glucose Monitoring Suppl (ACCU-CHEK GUIDE) w/Device KIT [id number] Yes Use to check blood glucose  daily (DX: type 2 DM E 11.65) Ann Held, DO Taking Active   gabapentin (NEURONTIN) 600 MG tablet [id number] Yes TAKE 1 TABLET BY MOUTH 3  TIMES DAILY Carollee Herter, Alferd Apa, DO Taking Active            Med Note (Tamya Denardo B  Wed Feb 27, 2022  3:49 PM) Taking 1 or 2 times a day as needed  glucose blood (ACCU-CHEK GUIDE) test strip [id number] Yes Use to check blood glucose once a day (Dx: type 2 DM / E11.65) Carollee Herter, Alferd Apa, DO Taking Active   hydrochlorothiazide (HYDRODIURIL) 25 MG tablet [id number] Yes TAKE 1 TABLET BY MOUTH DAILY Ann Held, DO Taking Active   levothyroxine (SYNTHROID) 50 MCG tablet [id number] Yes TAKE 1 TABLET BY MOUTH DAILY Carollee Herter, Alferd Apa, DO Taking Active   losartan (COZAAR) 100 MG tablet [id number] Yes TAKE 1 TABLET BY MOUTH ONCE  DAILY Carollee Herter, Alferd Apa, DO Taking Active   metFORMIN (GLUCOPHAGE) 500 MG tablet [id number] Yes TAKE 1 TABLET BY MOUTH DAILY  WITH BREAKFAST Roma Schanz R, DO Taking Active   metoprolol succinate (TOPROL-XL) 100 MG 24 hr tablet [id number] Yes Take 1 tablet (100 mg total) by mouth daily. Take with or immediately following a meal. Carollee Herter, Alferd Apa, DO Taking Active   montelukast (SINGULAIR) 10 MG tablet [id number] Yes Take 1 tablet (10 mg total) by mouth at bedtime. Carollee Herter, [place], DO Taking Active   NON FORMULARY [id number] Yes [facility]  Scar Cream -  Verapamil 10%, Pentoxifylline 5% Apply 1-2 grams to affected area 3-4 times daily Qty. 120 gm 3 refills [provider] Taking Active   oxybutynin (DITROPAN-XL) 10 MG 24 hr tablet [id number] Yes Take 1 tablet (10 mg total) by mouth at bedtime. Ann Held, DO Taking Active     Discontinued 01/17/22 1530 (Reorder)   PARoxetine (PAXIL) 40 MG tablet [id number] Yes Take 1 tablet (40 mg total) by mouth every morning. Ann Held, DO Taking Active   pravastatin (PRAVACHOL) 40 MG tablet [id number] Yes Take 1 tablet (40 mg  total) by mouth daily. For lowering cholesterol and heart health Ann Held, DO Taking Active             Patient Active Problem List   Diagnosis Date Noted   Snapping lateral band due to ligament laxity 02/19/2022   Moderate episode of recurrent major depressive disorder ([place]) 01/01/2022   Polyuria 01/01/2022   Mixed stress and urge urinary incontinence 01/01/2022   Wound infection 10/04/2021   Wound of right ankle 09/27/2021   Trigger thumb, right thumb 09/13/2021   Pruritic dermatitis 05/25/2021   Vaginal discharge 05/25/2021   Balance disorder 05/25/2021   Dizziness 05/25/2021   Osteoarthritis    Vitamin D deficiency    Urinary frequency 10/31/2020   Chronic pain of both shoulders 10/31/2020   Female pattern baldness 10/06/2020   Myalgia 10/06/2020   Urge incontinence of urine 10/06/2020   History of diabetes mellitus, type II 01/11/2020   Allergies 02/03/2019   Acute vaginitis 04/22/2018   Morbid obesity 04/22/2018   Generalized anxiety disorder 04/22/2018   Obstructive sleep apnea 01/30/2018   Major depressive disorder 01/30/2018   Pansinusitis 01/30/2018   Aortic atherosclerosis 10/10/2017   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   Hyperlipidemia 04/17/2017   Anemia, iron deficiency 04/17/2017   Atypical chest pain 08/31/2014   Arm weakness 08/31/2014   Hearing loss 07/24/2012   Hypothyroidism 11/02/2010   Essential hypertension 08/31/2010   Hyperglycemia, fasting 35/36/1443   Diastolic dysfunction 15/40/0867   Back pain with radiculopathy 01/25/2010   Fatigue 01/25/2010   Tobacco abuse 12/01/2008   Restless legs syndrome 12/01/2008   Leg pain, bilateral 11/17/2008   Palpitations 02/12/2008   GERD (gastroesophageal reflux  disease) 07/16/2007   Hiatal hernia 07/16/2007   Knee pain 07/16/2007   Low back pain 07/16/2007   Peripheral vertigo 04/30/2007   Insomnia 04/30/2007   Abdominal pain, right upper quadrant 04/30/2007    Immunization History   Administered Date(s) Administered   Fluad Quad(high Dose 65+) 08/30/2021   Influenza Whole 10/01/2007, 08/31/2010   Influenza,inj,Quad PF,6+ Mos 10/12/2013, 08/29/2015, 08/13/2016, 08/12/2017, 08/03/2018   Influenza-Unspecified 08/10/2019   PFIZER Comirnaty(Gray Top)Covid-19 Tri-Sucrose Vaccine 02/27/2021   PFIZER(Purple Top)SARS-COV-2 Vaccination 01/11/2020, 01/31/2020, 08/24/2020   Pfizer Covid-19 Vaccine Bivalent Booster 60yrs & up 08/29/2021   Pneumococcal Polysaccharide-23 02/27/2021   Td 11/02/2010, 09/27/2021   Tdap 08/03/2018   Zoster Recombinat (Shingrix) 04/17/2017, 06/17/2017   Zoster, Live 03/12/2016    Conditions to be addressed/monitored: HTN, HLD, DMII, Anxiety, Depression, GERD, Tobacco Use, and stress / urge incontinence  Care Plan : General Pharmacy (Adult)  Updates made by [name], RPH-CPP since 07/07/2022 12:00 AM     Problem: Chronic Conditions: type 2 DM; HTN; HDL; OSA; depression / GAD; stress / urge incontinence; GERD; hypothyroidism; neuropathy; aortic atherosclerosis   Priority: High  Onset Date: 01/17/2022     Long-Range Goal: Provide education, support and care coordination for medication therapy and chronic conditions   Start Date: 01/17/2022  Recent Progress: On track  Priority: High  Note:   Current Barriers:  Unable to independently monitor therapeutic efficacy Unable to achieve control of hyperlipidemia  Unable to maintain control of stress/ urge incontinence Does not adhere to prescribed medication regimen Education needed regarding type 2 DM, goals and diet Increase in depression symptoms  Pharmacist Clinical Goal(s):  Over the next 90 days, patient will achieve adherence to monitoring guidelines and medication adherence to achieve therapeutic efficacy achieve control of hyperlipidemia as evidenced by LDL < 70 maintain control of type 2 DM as evidenced by A1c < 7.0%  adhere to prescribed medication regimen as evidenced by refill  history Demonstrate knowledge needed to adequately monitor and manage type 2 DM  through collaboration with PharmD and provider.  Coordinate with provider and social worker for treatment of depression. Goal is to decrease number of day with depression symptoms.  Interventions: 1:1 collaboration with Carollee Herter, Alferd Apa, DO regarding development and update of comprehensive plan of care as evidenced by provider attestation and co-signature Inter-disciplinary care team collaboration (see longitudinal plan of care) Comprehensive medication review performed; medication list updated in electronic medical record  Diabetes: Controlled; A1c goal < 7.0% Current treatment: Metformin 500mg  - take 1 tablet daily Current glucose readings: ranges form 118 to 144 per patient.  Occasionally has dizziness which she is not sure is related to low blood glucose or not.  Complications: neuropathy, aortic atherosclerosis Current meal patterns: improved foods choices and trying to limit intake of high carbohydrate / sugar foods; patient has lots of questions about food effect on blood glucose.  Last dietician visit was 05/2022 Interventions:  Educated on diet and diabetes; Reviewed which food increase blood glucose the most and about limiting frequency and serving sizes of these foods. Continue to follow up with dietician as planned.  Continue to checking blood glucose daily at varying times of day (both before and after meals).  06/14/2022 placed 14 day Continuous Glucose Monitor to check blood glucose trends to identify possible hypo and hyperglycemia Reviewed home blood glucose readings and reviewed goals  Fasting blood glucose goal (before meals) = 80 to 130 Blood glucose goal after a meal = less than 180  Reminded  patient to schedule annual eye exam  Hypertension: Controlled; blood pressure goal <140/90 BP Readings from Last 3 Encounters:  06/14/22 110/70  01/01/22 138/78  10/04/21 118/88  current  treatment: Hydrochlorothiazide 25mg  - take 1 tablet a day Losartan 100mg  daily Metoprolol succinate 100mg  - take 1 tablet daily  Current home readings: not checking at home Denies hypotensive/hypertensive symptoms Interventions:  Educated on benefits of blood pressure control for prevention of heart and kidney disease Recommended continue current medications for hypertension   Hyperlipidemia: Uncontrolled; LDL goal is < 70 and triglycerides < 150 Lab Results  Component Value Date   CHOL 183 01/01/2022   HDL 51.80 01/01/2022   LDLCALC 94 01/01/2022   LDLDIRECT 126.3 06/06/2011   TRIG 185.0 (H) 01/01/2022   CHOLHDL 4 01/01/2022  Current treatment: Pravastatin 40mg  daily (last refilled 01/17/2022) Medications previously tried: pravastatin 20mg  - ineffective  Patient states she does not need refil of pravastatin for another 1 to 2 weeks. Asked if she ever forgets to take pravastatin as it should be due for refill this week and she admits that she does miss evening dose about 2 times per week. She is using pill container.  Interventions:  Educated on lipid goals Counseled on benefits of statin therapy Recommended continue pravastatin 40mg      Depression/Anxiety Improved since dose of paroxetine increased 03/2022 Recent increase in activity. Happy about son's recent marriage.  Goal: decrease number of days per month with symptoms of depression or anxiety    05/21/2022   12:06 PM 04/17/2022    4:06 PM 03/05/2022   11:33 AM  PHQ9 SCORE ONLY  PHQ-9 Total Score 8 12 0  Current treatment: Paroxetine 40mg  daily Interventions:  Discussed importance of adherence with treatment to get most benefits Continue to meet with social worker for stress management.   Stress / Urge Incontinence:  Improving with oxybutynin but still having some incontinence Was prescribed Vesicare but copay of too high Attended 1 session of pelvic PT for urinary incontinence.  Interventions: Encouraged her to  reschedule appointment for pelvic floor rehab Continue oxybutynin - sent in updated prescription  Medication management Pharmacist Clinical Goal(s): Over the next 90 days, patient will work with PharmD and providers to maintain optimal medication adherence Current pharmacy: [facility] Interventions Comprehensive medication review performed. Reviewed refill history and assessed adherence Continue current medication management strategy Updated prescription for metoprolol succinate 100mg  ER and requested refill for lancets and testing supplies.   Patient Goals/Self-Care Activities Over the next 90 days, patient will:  take medications as prescribed,  check glucose daily (at varying times of day), document, and provide at future appointments Will use Libre 3 sensor for 14 days to check blood glucose trends  collaborate with provider on medication access solutions, and  engage in dietary modifications by limiting in take of high carbohydrate foods continue pravastatin 40mg  for heart health   Follow Up Plan: Telephone follow up appointment with care management team member scheduled for:  2 to 4 weeks           Medication Assistance: None required.  Patient affirms current coverage meets needs.  Patient's preferred pharmacy is:  [facility] - [place], [place] - [address] [place] [address] Phone: [phone] Fax: [place] [phone] [place], [place] - [address] [place] [address] Phone: [phone] Fax: [phone]  [facility] ([place] - [place], [place]  [facility] [address] [place] [address] Phone: [phone] Fax: [phone]  [facility] ([facility]) - [place], [place] [address] Phone: [phone] Fax:  [phone]   Follow Up:  Patient agrees to Care Plan and Follow-up.  Plan: Telephone follow up appointment with care management team member scheduled for:  6 weeks; Will have f/u with PCP in 2 week and [facility] nurse in 4 weeks.   [name], PharmD Clinical Pharmacist [facility]

## 2022-07-04 ENCOUNTER — Telehealth: Payer: Self-pay

## 2022-07-04 NOTE — Telephone Encounter (Signed)
   Telephone encounter was:  Unsuccessful.  07/04/2022 Name: Debra [name] MRN: [id number] DOB: 12-Dec-1955  Unsuccessful outbound call made today to assist with:  Food Insecurity and Financial Difficulties related to financial strain  Outreach Attempt:  2nd Attempt  Unable to [place], [place] Management  [phone] [address], [place], [place] [address] Phone: [phone] Email: [email or web address]

## 2022-07-05 ENCOUNTER — Telehealth: Payer: Self-pay

## 2022-07-05 NOTE — Telephone Encounter (Signed)
   Telephone encounter was:  Unsuccessful.  07/05/2022 Name: MISTIE ADNEY MRN: [id number] DOB: Apr 18, 1956  Unsuccessful outbound call made today to assist with:  Food Insecurity and Financial Difficulties related to financial strain  Outreach Attempt:  3rd Attempt.  Referral closed unable to contact patient.  Unable to [employer] mailed resources to patient    [place], [place] Management  [phone] [address], [place], [place] [address] Phone: [phone] Email: [email or web address]

## 2022-07-17 ENCOUNTER — Telehealth: Payer: Self-pay | Admitting: Pharmacist

## 2022-07-17 ENCOUNTER — Telehealth: Payer: Self-pay | Admitting: Family Medicine

## 2022-07-17 MED ORDER — FREESTYLE LIBRE 2 SENSOR MISC: 2 refills | Status: DC

## 2022-07-17 NOTE — Telephone Encounter (Signed)
Working on prior authorization for Continuous Glucose Monitor. Patient is not taking insulin so might not be covered by her insurance. If not she will have to restart checking with fingerstick glucometer.  Recommend checking blood glucose once daily at varying times of day or when she is having symptoms of low blood glucose: Fast heartbeat. Shaking. Sweating. Nervousness or anxiety. Irritability or confusion. Dizziness. Hunger. Tried to contact patient to discuss that prior authorization was pending and to start back using fingerstick blood glucose testing until we hear decision. But no answer and unable to LM on VM.

## 2022-07-17 NOTE — Telephone Encounter (Signed)
Spoke with patient - see other phone encounter for more information

## 2022-07-17 NOTE — Telephone Encounter (Signed)
Caller Name [place] Phone Number [phone] Patient Name Debra [name] Patient DOB February 18, 1956 Call Type Message Only Information Provided Reason for Call Request for General Office Information Initial Comment Caller is trying to reach the office. She is trying to let someone know the thing on her arm from diabetes fell off in a store. Additional Comment Please call pt back. Disp. Time Disposition Final User 07/17/2022 11:51:52 AM General Information Provided Yes Paul Dykes Call Closed By: Paul Dykes Transaction Date/Time: 07/17/2022 11:49:14 AM (ET)

## 2022-07-17 NOTE — Telephone Encounter (Signed)
Patient called stating the freestyle libre had fallen from her arm and was not sure on what to do. Triaged patient for further assistance.

## 2022-07-17 NOTE — Telephone Encounter (Signed)
Called pharmacy to see if Freestyle [place] 3 sensors were covered by her insurance. Per [place] 3 sensors were not covered by the [place] 2 sensors might be.  Sent new Rx to them for [place] 2 sensors. Prior authorization needed. [facility] sent prior authorization to office by Cover My Meds.

## 2022-07-17 NOTE — Telephone Encounter (Signed)
   Sensor fell off 07/12/2022 - see above report. Patient's blood glucose is well controlled with current medication. I did submit prior authorization for [place] 2 but since she is not having lows < 54 and does not use insulin, I don't think she will qualify for Continuous Glucose Monitor.  Spoke with patient to let he know about pending prior authorization. She expressed increased stress due to her niece being in EDRF and needing kidney transplants. Niece has type 1 DM and this has scared patient. Reassured her that her blood glucose  has been controlled and she is doing well. Continue to take metformin and check blood glucose daily with fingerstick glucometer. Sees PCP in 3 weeks and I follow up with her in about 6 weeks.

## 2022-07-18 ENCOUNTER — Telehealth: Payer: Self-pay

## 2022-07-18 NOTE — Telephone Encounter (Signed)
Nurse Assessment Nurse: Ysidro Evert, RN, Levada Dy Date/Time Eilene Ghazi Time): 07/18/2022 2:50:29 PM Confirm and document reason for call. If symptomatic, describe symptoms. ---Caller states she is having frequency of urination and incontinence. Does the patient have any new or worsening symptoms? ---Yes Will a triage be completed? ---Yes Related visit to physician within the last 2 weeks? ---No Does the PT have any chronic conditions? (i.e. diabetes, asthma, this includes High risk factors for pregnancy, etc.) ---Yes List chronic conditions. ---diabetes, hypertension Is this a behavioral health or substance abuse call? ---No Guidelines Guideline Title Affirmed Question Affirmed Notes Nurse Date/Time (Eastern Time) Urinary Symptoms Side (flank) or lower back pain present Earleen Reaper 07/18/2022 2:51:59 PM Disp. Time Eilene Ghazi Time) Disposition Final User 07/18/2022 2:15:23 PM Attempt made - no message left Pryor Montes 07/18/2022 2:18:24 PM Send To RN Personal Raphael Gibney, RN, Vera 07/18/2022 2:29:50 PM Attempt made - no message left Earleen Reaper 07/18/2022 2:53:59 PM See PCP within 24 Hours Yes Ysidro Evert, RN, Levada Dy PLEASE NOTE: All timestamps contained within this report are represented as [place] Standard Time. CONFIDENTIALTY NOTICE: This fax transmission is intended only for the addressee. It contains information that is legally privileged, confidential or otherwise protected from use or disclosure. If you are not the intended recipient, you are strictly prohibited from reviewing, disclosing, copying using or disseminating any of this information or taking any action in reliance on or regarding this information. If you have received this fax in error, please notify us immediately by telephone so that we can arrange for its return to [place]. Phone: [phone], Toll-Free: [phone], Fax: [phone] Page: 2 of 2 Call Id: [id number] Final Disposition 07/18/2022 2:53:59 PM See PCP  within 24 Hours Yes Ysidro Evert, RN, Marin Shutter Disagree/Comply Comply Caller Understands Yes PreDisposition Did not know what to do Care Advice Given Per Guideline SEE PCP WITHIN 24 HOURS: * IF OFFICE WILL BE OPEN: You need to be examined within the next 24 hours. Call your doctor (or NP/PA) when the office opens and make an appointment. CALL BACK IF: * Fever occurs * Unable to urinate and bladder feels full * You become worse CARE ADVICE given per Urinary Symptoms (Adult) guideline. Comments User: Dannielle Burn, RN Date/Time Eilene Ghazi Time): 07/18/2022 2:15:06 PM attempted to return call and recording states that the wireless customer I have called is not available and to try call again later. Referrals REFERRED TO PCP OFFICE

## 2022-07-18 NOTE — Telephone Encounter (Signed)
Appt tomorrow w/ Dr. Nani Ravens.

## 2022-07-19 ENCOUNTER — Ambulatory Visit (INDEPENDENT_AMBULATORY_CARE_PROVIDER_SITE_OTHER): Payer: Medicare Other | Admitting: Family Medicine

## 2022-07-19 ENCOUNTER — Encounter: Payer: Self-pay | Admitting: Family Medicine

## 2022-07-19 VITALS — BP 122/80 | HR 74 | Temp 97.8°F | Resp 18 | Ht 63.0 in | Wt 165.0 lb

## 2022-07-19 DIAGNOSIS — J0100 Acute maxillary sinusitis, unspecified: Secondary | ICD-10-CM

## 2022-07-19 DIAGNOSIS — R059 Cough, unspecified: Secondary | ICD-10-CM | POA: Diagnosis not present

## 2022-07-19 DIAGNOSIS — N393 Stress incontinence (female) (male): Secondary | ICD-10-CM

## 2022-07-19 LAB — POC COVID19 BINAXNOW: SARS Coronavirus 2 Ag: NEGATIVE

## 2022-07-19 MED ORDER — FLUCONAZOLE 150 MG PO TABS: ORAL_TABLET | ORAL | 0 refills | Status: DC

## 2022-07-19 MED ORDER — DOXYCYCLINE HYCLATE 100 MG PO TABS: 100.0000 mg | ORAL_TABLET | Freq: Two times a day (BID) | ORAL | 0 refills | Status: AC

## 2022-07-19 MED ORDER — PREDNISONE 20 MG PO TABS: 40.0000 mg | ORAL_TABLET | Freq: Every day | ORAL | 0 refills | Status: AC

## 2022-07-19 NOTE — Progress Notes (Signed)
Chief Complaint  Patient presents with   Sore Throat    sore throat, sore eye, congestion, headache, facial pain, rash that started this morning. Pt did take a covid test but it looked normal- she seems unsure. All sxs started Monday.  Concerns/ questions: urinary frequency      Debra [name] here for URI complaints.  Duration: 1 week Associated symptoms: sinus headache, sinus pain, sore throat, myalgia, and slight cough Also having stress incontinence urinating when she sneezes Denies: sinus congestion, rhinorrhea, itchy watery eyes, ear pain, ear drainage, wheezing, shortness of breath, and fevers, urinary freq/pain Treatment to date: none Sick contacts: No Tested neg for covid.   Past Medical History:  Diagnosis Date   Abdominal pain, right upper quadrant 04/30/2007   Allergies 02/03/2019   Allergy    Anemia, iron deficiency 04/17/2017   Aortic atherosclerosis 10/10/2017   Atypical chest pain 08/31/2014   Back pain with radiculopathy 01/25/2010   Check xray --- ? Cause of leg weakness   Chronic pain of both shoulders 10/31/2020   Constipation    DDD (degenerative disc disease)    Diabetes mellitus type II, uncontrolled 59/16/3846   Diastolic dysfunction 65/99/3570   Epilepsy    Childhood seziures; pt reports that she grew out of them and they have not occurred since childhood   Essential hypertension 08/31/2010   Poorly controlled will alter medications, encouraged DASH diet, minimize caffeine and obtain adequate sleep. Report concerning symptoms and follow up as directed and as needed [employer] metoprolol Edema may be c   Fatigue 01/25/2010   Generalized anxiety disorder 04/22/2018   Stable co'nt meds   GERD (gastroesophageal reflux disease) 07/16/2007   Hearing loss 07/24/2012   Refer to hearing clinic   Hiatal hernia 07/16/2007   Hyperglycemia, fasting 05/04/2010   Hyperlipidemia 04/17/2017   Encouraged heart healthy diet, increase exercise, avoid trans  fats, consider a krill oil cap daily   Hypothyroidism 11/02/2010   con't meds; Lab Results Component Value TSH 1.44 08/12/2017   Insomnia 04/30/2007   Knee pain 07/16/2007   Leg pain, bilateral 11/17/2008   Low back pain 07/16/2007   Major depressive disorder 01/30/2018   con't meds F/u counselor   Mixed connective tissue disease    with Raynaud's   Obstructive sleep apnea 01/30/2018   F/u with pulm; May be cause of some of her memory/concentration problems   Osteoarthritis    Osteoporosis    Palpitations 02/12/2008   Pansinusitis 01/30/2018   Peripheral vertigo 04/30/2007   Resolved rto prn   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   Restless legs syndrome 12/01/2008   Stomach ulcer    Swallowing difficulty    Upper respiratory tract infection 12/14/2019   Urge incontinence of urine 10/06/2020   Vitamin D deficiency     Objective BP 122/80 (BP Location: Left Arm, Patient Position: Sitting, Cuff Size: Normal)   Pulse 74   Temp 97.8 F (36.6 C) (Temporal)   Resp 18   Ht '5\' 3"'$  (1.6 m)   Wt 165 lb (74.8 kg)   SpO2 97%   BMI 29.23 kg/m  General: Awake, alert, appears stated age HEENT: AT, [place], ears patent b/l and TM's neg, nares patent w/o discharge, pharynx pink and without exudates, MMM, R max sinus very ttp, R submand gland enlarged and slightly ttp Neck: No masses or asymmetry Heart: RRR Lungs: CTAB, no accessory muscle use Psych: Age appropriate judgment and insight, normal mood and affect  Acute maxillary sinusitis, recurrence not  specified - Plan: predniSONE (DELTASONE) 20 MG tablet, doxycycline (VIBRA-TABS) 100 MG tablet, fluconazole (DIFLUCAN) 150 MG tablet  Cough, unspecified type - Plan: POC COVID-19 BinaxNow  Stress incontinence  5 d pred burst 40 mg/d. If no improvement in 2 d, will take 7 d of doxy. Continue to push fluids, practice good hand hygiene, cover mouth when coughing. Kegel exercises given for stress incontinence.  F/u prn. If starting to experience  fevers, shaking, or shortness of breath, seek immediate care. Pt voiced understanding and agreement to the plan.  [place], DO 07/19/22 11:52 AM

## 2022-07-19 NOTE — Patient Instructions (Addendum)
Continue to push fluids, practice good hand hygiene, and cover your mouth if you cough.  If you start having fevers, shaking or shortness of breath, seek immediate care.  OK to take Tylenol 1000 mg (2 extra strength tabs) or 975 mg (3 regular strength tabs) every 6 hours as needed.  Start with the prednisone. If no better in 2 days, take the doxycycline. The Diflucan/fluconazole is there should you develop a yeast infection.   Let us know if you need anything.

## 2022-07-22 ENCOUNTER — Telehealth: Payer: Self-pay | Admitting: Family Medicine

## 2022-07-22 NOTE — Telephone Encounter (Signed)
Pt called stating that she was experiencing the following symptoms:  -BS reading of 249 on 9.24.23  -BP reading of 152/87 Pulse 82 on 9.25.23  -Severe Headaches  Pt was transferred to triage nurse for further eval.

## 2022-07-22 NOTE — Telephone Encounter (Signed)
Nurse Assessment Nurse: Cristino Martes, RN, Joelene Millin Date/Time (Eastern Time): 07/22/2022 4:14:34 PM Confirm and document reason for call. If symptomatic, describe symptoms. ---Caller states she is have high blood pressure after taking an antibiotic and prednisone. When she took it last night her sugar level went up to 249. Today her BP 152/87. Does the patient have any new or worsening symptoms? ---Yes Will a triage be completed? ---Yes Related visit to physician within the last 2 weeks? ---No Does the PT have any chronic conditions? (i.e. diabetes, asthma, this includes High risk factors for pregnancy, etc.) ---Yes List chronic conditions. ---DM, HTN, OP, hyperlipidemia Is this a behavioral health or substance abuse call? ---No Guidelines Guideline Title Affirmed Question Affirmed Notes Nurse Date/Time (Eastern Time) Blood Pressure - High [1] Taking BP medications AND [2] feels is having side effects (e.g., impotence, cough, dizzy upon standing) [place], RN, Joelene Millin 07/22/2022 4:18:51 PM PLEASE NOTE: All timestamps contained within this report are represented as [place] Standard Time. CONFIDENTIALTY NOTICE: This fax transmission is intended only for the addressee. It contains information that is legally privileged, confidential or otherwise protected from use or disclosure. If you are not the intended recipient, you are strictly prohibited from reviewing, disclosing, copying using or disseminating any of this information or taking any action in reliance on or regarding this information. If you have received this fax in error, please notify us immediately by telephone so that we can arrange for its return to [place]. Phone: [phone], Toll-Free: [phone], Fax: [phone] Page: 2 of 2 Call Id: [id number] [place]. Time Eilene Ghazi Time) Disposition Final User 07/22/2022 3:49:54 PM Attempt made - no message left Stripling, RN, Joelene Millin 07/22/2022 4:04:13 PM Attempt made - no message  left Stri[place], RN, Joelene Millin 07/22/2022 4:22:43 PM SEE PCP WITHIN 3 DAYS Yes Stripling, RN, Joelene Millin Final Disposition 07/22/2022 4:22:43 PM SEE PCP WITHIN 3 DAYS Yes Stripling, RN, Renea Ee Disagree/Comply Comply Caller Understands Yes PreDisposition Call Doctor Care Advice Given Per Guideline SEE PCP WITHIN 3 DAYS: * You need to be seen within 2 or 3 days. MEDICINE SIDE EFFECTS: * Some people experience side effects when they take high blood pressure (BP) medicines. * Possible side effects include cough, dizziness with standing, drowsiness and impotence. * Be certain to talk with your doctor before you suddenly stop taking your BP medications. CALL BACK IF: * Weakness or numbness of the face, arm or leg on one side of the body occurs * You become worse * Chest pain or difficulty breathing occurs * Difficulty walking, difficulty talking, or severe headache occurs CARE ADVICE given per High Blood Pressure (Adult) guideline. Comments User: Jim Desanctis, RN Date/Time Eilene Ghazi Time): 07/22/2022 4:17:20 PM HA (4/10) User: Jim Desanctis, RN Date/Time Eilene Ghazi Time): 07/22/2022 4:17:52 PM HX: Anxiety Referrals REFERRED TO PCP OFFICE

## 2022-07-23 ENCOUNTER — Encounter: Payer: Self-pay | Admitting: Family Medicine

## 2022-07-23 ENCOUNTER — Ambulatory Visit (INDEPENDENT_AMBULATORY_CARE_PROVIDER_SITE_OTHER): Payer: Medicare Other | Admitting: Family Medicine

## 2022-07-23 VITALS — BP 128/80 | HR 68 | Temp 97.8°F | Wt 170.0 lb

## 2022-07-23 DIAGNOSIS — I10 Essential (primary) hypertension: Secondary | ICD-10-CM

## 2022-07-23 DIAGNOSIS — E1165 Type 2 diabetes mellitus with hyperglycemia: Secondary | ICD-10-CM

## 2022-07-23 DIAGNOSIS — R3589 Other polyuria: Secondary | ICD-10-CM | POA: Diagnosis not present

## 2022-07-23 MED ORDER — OXYBUTYNIN CHLORIDE ER 10 MG PO TB24: 10.0000 mg | ORAL_TABLET | Freq: Every day | ORAL | 2 refills | Status: DC

## 2022-07-23 NOTE — Telephone Encounter (Signed)
Pt seen on 09/22 with [place]. Please advise

## 2022-07-23 NOTE — Patient Instructions (Signed)
Stop the prednisone. This is likely affecting the sugars. We do not need to adjust anything, but now you know this medicine will raise sugars in the future.  Blood pressure looks good today. Monitor this moving forward as well and bring readings to your next visit with Dr. Etter [name] so she has more information.  Let us know if you need anything.

## 2022-07-23 NOTE — Progress Notes (Signed)
Chief Complaint  Patient presents with   blood sugar elevated    Subjective: Patient is a 66 y.o. female here for elevated sugars.  She has a hx of DM.  Most recent A1c was 6.8 6 months ago.  She is currently taking metformin 500 mg daily.  She reports compliance, no adverse effects.  Sugars have been running in low 100's, in the 200's while on the prednisone.  She was started on prednisone on 9/22 for a sinus infection. BP's running higher as well.   Patient's been having frequent urination as well.  She did well on Ditropan 10 mg daily.  She is requesting a refill of this.  No pain, constipation, bleeding, discharge, or fevers.  Past Medical History:  Diagnosis Date   Abdominal pain, right upper quadrant 04/30/2007   Allergies 02/03/2019   Allergy    Anemia, iron deficiency 04/17/2017   Aortic atherosclerosis 10/10/2017   Atypical chest pain 08/31/2014   Back pain with radiculopathy 01/25/2010   Check xray --- ? Cause of leg weakness   Chronic pain of both shoulders 10/31/2020   Constipation    DDD (degenerative disc disease)    Diabetes mellitus type II, uncontrolled 72/53/6644   Diastolic dysfunction 03/47/4259   Epilepsy    Childhood seziures; pt reports that she grew out of them and they have not occurred since childhood   Essential hypertension 08/31/2010   Poorly controlled will alter medications, encouraged DASH diet, minimize caffeine and obtain adequate sleep. Report concerning symptoms and follow up as directed and as needed Start [employer] metoprolol Edema may be c   Fatigue 01/25/2010   Generalized anxiety disorder 04/22/2018   Stable co'nt meds   GERD (gastroesophageal reflux disease) 07/16/2007   Hearing loss 07/24/2012   Refer to hearing clinic   Hiatal hernia 07/16/2007   Hyperglycemia, fasting 05/04/2010   Hyperlipidemia 04/17/2017   Encouraged heart healthy diet, increase exercise, avoid trans fats, consider a krill oil cap daily   Hypothyroidism 11/02/2010    con't meds; Lab Results Component Value TSH 1.44 08/12/2017   Insomnia 04/30/2007   Knee pain 07/16/2007   Leg pain, bilateral 11/17/2008   Low back pain 07/16/2007   Major depressive disorder 01/30/2018   con't meds F/u counselor   Mixed connective tissue disease    with Raynaud's   Obstructive sleep apnea 01/30/2018   F/u with pulm; May be cause of some of her memory/concentration problems   Osteoarthritis    Osteoporosis    Palpitations 02/12/2008   Pansinusitis 01/30/2018   Peripheral vertigo 04/30/2007   Resolved rto prn   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 No accessory muscle use Psych: Age appropriate judgment and insight, normal affect and mood  Assessment and Plan: Type 2 diabetes mellitus with hyperglycemia, without long-term current use of insulin (HCC)  Essential hypertension  Polyuria - Plan: oxybutynin (DITROPAN-XL) 10 MG 24 hr tablet  Adverse effect of medication. Stop prednisone. Sugars should normalize.  Chronic, stable. No changes.  Chronic,  unstable.  Restart oxybutynin 10 mg daily.  She tolerated this well and is unclear why she stopped. F/u w Dr. Etter Sjogren as originally scheduled.  The patient voiced understanding and agreement to the plan.  [place], DO 07/23/22  4:26 PM

## 2022-07-24 NOTE — Telephone Encounter (Signed)
Pt seen yesterday with Debra [name]

## 2022-07-25 ENCOUNTER — Encounter: Payer: Medicare Other | Admitting: Family Medicine

## 2022-07-25 ENCOUNTER — Ambulatory Visit: Payer: Medicare Other | Admitting: Family Medicine

## 2022-07-27 DIAGNOSIS — E1159 Type 2 diabetes mellitus with other circulatory complications: Secondary | ICD-10-CM

## 2022-07-27 DIAGNOSIS — F17210 Nicotine dependence, cigarettes, uncomplicated: Secondary | ICD-10-CM | POA: Diagnosis not present

## 2022-07-27 DIAGNOSIS — E785 Hyperlipidemia, unspecified: Secondary | ICD-10-CM

## 2022-07-27 DIAGNOSIS — I10 Essential (primary) hypertension: Secondary | ICD-10-CM | POA: Diagnosis not present

## 2022-07-27 DIAGNOSIS — Z7984 Long term (current) use of oral hypoglycemic drugs: Secondary | ICD-10-CM

## 2022-07-30 ENCOUNTER — Encounter: Payer: Medicare Other | Attending: Family Medicine | Admitting: Registered"

## 2022-07-30 ENCOUNTER — Encounter: Payer: Self-pay | Admitting: Registered"

## 2022-07-30 DIAGNOSIS — E119 Type 2 diabetes mellitus without complications: Secondary | ICD-10-CM | POA: Insufficient documentation

## 2022-07-30 NOTE — Patient Instructions (Addendum)
-   Aim to eat 3 meals + snacks daily.   - Check out [email or web address] for recipes.   - Use meal guide given to you by other provider with meal and snack ideas.

## 2022-07-30 NOTE — Progress Notes (Signed)
  Diabetes Self-Management Education  Visit Type:  Follow-up  Appt. Start Time: 11:38 Appt. End Time: 12:15  08/07/2022  [name], identified by name and date of birth, is a 66 y.o. female with a diagnosis of Diabetes:  .   ASSESSMENT  States she discussed with a provider about having CGM for a trial basis. States she used it and found it beneficial.  States she was having to get up during the night to eat something and then go back to bed due to hypoglycemic episodes. States she was seeing BS numbers (63-67); would eat graham crackers + peanut butter or yogurt and drink diet ginger ale. States she liked using it. States her numbers were going as high as 269.   States she was on prednisone and antibiotic last week due to having sinusitis, which increased blood sugar numbers.   States she was eating [place] bacon + egg whites + thin bagel for breakfast.   States she checked her blood sugar last night around midnight: BS reading 123.  States she lost her eating plan that we created last time.  States she gets a little nervous sometimes which makes her not hungry anymore.   There were no vitals taken for this visit. There is no height or weight on file to calculate BMI.    Diabetes Self-Management Education - 07/30/22 1147       Dietary Intake   Lunch 11-12 pm - cereal + coconut milk    Dinner 7 pm - [employer]-chicken slider + jamocha shake    Snack (evening) 11 pm - salad + unsweetened ice cream    Beverage(s) coconut milk, Jamocha shake, pepsi zero, water, diet alcoholic drink      Post-Education Assessment   Patient understands the diabetes disease and treatment process. Needs Instruction    Patient understands incorporating nutritional management into lifestyle. Comprehends key points    Patient undertands incorporating physical activity into lifestyle. Needs Instruction    Patient understands using medications safely. Needs Review    Patient understands monitoring blood  glucose, interpreting and using results Comprehends key points    Patient understands prevention, detection, and treatment of acute complications. Comprehends key points    Patient understands prevention, detection, and treatment of chronic complications. Needs Review    Patient understands how to develop strategies to address psychosocial issues. Needs Review    Patient understands how to develop strategies to promote health/change behavior. Comprehends key points      Outcomes   Program Status Not Completed             Learning Objective:  Patient will have a greater understanding of diabetes self-management. Patient education plan is to attend individual and/or group sessions per assessed needs and concerns.   Plan:   Patient Instructions  - Aim to eat 3 meals + snacks daily.   - Check out [email or web address] for recipes.   - Use meal guide given to you by other provider with meal and snack ideas.     Expected Outcomes:  Demonstrated interest in learning but significant barriers to change  Education material provided: ADA - How to Thrive: A Guide for Your Journey with Diabetes  If problems or questions, patient to contact team via:  Phone and Email  Future DSME appointment: - PRN

## 2022-08-06 ENCOUNTER — Ambulatory Visit: Payer: Self-pay

## 2022-08-06 NOTE — Patient Instructions (Addendum)
Visit Information  Thank you for taking time to visit with me today. Please don't hesitate to contact me if I can be of assistance to you.   Following are the goals we discussed today:   Goals Addressed             This Visit's Progress    Care coordination: Diabetes management       Care Coordination Interventions: Advised patient to eat health per dietician recommendation, eat three meals plus snacks Reviewed Blood sugar target goals with patient: 80-130 before meals and <180 about 2 hour after a meal Discussed Target blood sugar range 80-130 before am meal and <180 2 hour after a meal Collaboration with Nutritionist, Isidore Moos regarding follow up and patient recommendation. Patient to follow up once she has started to implement some of the nutrition recommendations made from her healthcare team, once she has implemented changes and is managing those changes, can move forward with more diabetes in other areas. Reviewed after visit summary instructions from Nutritionist and clinical pharmacist Discussed group diabetes/Nutrition classes available also. Encouraged to check blood sugars and discussed importance of recording blood sugars.      Our next appointment is by telephone on 09/02/22 at 1:15 pm  Please call the care guide team at [phone] if you need to cancel or reschedule your appointment.   If you are experiencing a Mental Health or [place] or need someone to talk to, please call the Suicide and Crisis Lifeline: 988  Patient verbalizes understanding of instructions and care plan provided today and agrees to view in [place]. Active MyChart status and patient understanding of how to access instructions and care plan via MyChart confirmed with patient.     Thea Silversmith, RN, MSN, BSN, CCM [facility] Care Coordinator [phone]  Diabetes Mellitus and Nutrition, Adult When you have diabetes, or diabetes mellitus, it is very important to have healthy eating  habits because your blood sugar (glucose) levels are greatly affected by what you eat and drink. Eating healthy foods in the right amounts, at about the same times every day, can help you: Manage your blood glucose. Lower your risk of heart disease. Improve your blood pressure. Reach or maintain a healthy weight. What can affect my meal plan? Every person with diabetes is different, and each person has different needs for a meal plan. Your health care provider may recommend that you work with a dietitian to make a meal plan that is best for you. Your meal plan may vary depending on factors such as: The calories you need. The medicines you take. Your weight. Your blood glucose, blood pressure, and cholesterol levels. Your activity level. Other health conditions you have, such as heart or kidney disease. How do carbohydrates affect me? Carbohydrates, also called carbs, affect your blood glucose level more than any other type of food. Eating carbs raises the amount of glucose in your blood. It is important to know how many carbs you can safely have in each meal. This is different for every person. Your dietitian can help you calculate how many carbs you should have at each meal and for each snack. How does alcohol affect me? Alcohol can cause a decrease in blood glucose (hypoglycemia), especially if you use insulin or take certain diabetes medicines by mouth. Hypoglycemia can be a life-threatening condition. Symptoms of hypoglycemia, such as sleepiness, dizziness, and confusion, are similar to symptoms of having too much alcohol. Do not drink alcohol if: Your health care provider tells you  not to drink. You are pregnant, may be pregnant, or are planning to become pregnant. If you drink alcohol: Limit how much you have to: 0-1 drink a day for women. 0-2 drinks a day for men. Know how much alcohol is in your drink. In the [place] one drink equals one 12 oz bottle of beer (355 mL), one 5 oz glass of  wine (148 mL), or one 1 oz glass of hard liquor (44 mL). Keep yourself hydrated with water, diet soda, or unsweetened iced tea. Keep in mind that regular soda, juice, and other mixers may contain a lot of sugar and must be counted as carbs. What are tips for following this plan?  Reading food labels Start by checking the serving size on the Nutrition Facts label of packaged foods and drinks. The number of calories and the amount of carbs, fats, and other nutrients listed on the label are based on one serving of the item. Many items contain more than one serving per package. Check the total grams (g) of carbs in one serving. Check the number of grams of saturated fats and trans fats in one serving. Choose foods that have a low amount or none of these fats. Check the number of milligrams (mg) of salt (sodium) in one serving. Most people should limit total sodium intake to less than 2,300 mg per day. Always check the nutrition information of foods labeled as "low-fat" or "nonfat." These foods may be higher in added sugar or refined carbs and should be avoided. Talk to your dietitian to identify your daily goals for nutrients listed on the label. Shopping Avoid buying canned, pre-made, or processed foods. These foods tend to be high in fat, sodium, and added sugar. Shop around the outside edge of the grocery store. This is where you will most often find fresh fruits and vegetables, bulk grains, fresh meats, and fresh dairy products. Cooking Use low-heat cooking methods, such as baking, instead of high-heat cooking methods, such as deep frying. Cook using healthy oils, such as olive, canola, or sunflower oil. Avoid cooking with butter, cream, or high-fat meats. Meal planning Eat meals and snacks regularly, preferably at the same times every day. Avoid going long periods of time without eating. Eat foods that are high in fiber, such as fresh fruits, vegetables, beans, and whole grains. Eat 4-6 oz  (112-168 g) of lean protein each day, such as lean meat, chicken, fish, eggs, or tofu. One ounce (oz) (28 g) of lean protein is equal to: 1 oz (28 g) of meat, chicken, or fish. 1 egg.  cup (62 g) of tofu. Eat some foods each day that contain healthy fats, such as avocado, nuts, seeds, and fish. What foods should I eat? Fruits Berries. Apples. Oranges. Peaches. Apricots. Plums. Grapes. Mangoes. Papayas. Pomegranates. Kiwi. Cherries. Vegetables Leafy greens, including lettuce, spinach, kale, chard, collard greens, mustard greens, and cabbage. Beets. Cauliflower. Broccoli. Carrots. Green beans. Tomatoes. Peppers. Onions. Cucumbers. Brussels sprouts. Grains Whole grains, such as whole-wheat or whole-grain bread, crackers, tortillas, cereal, and pasta. Unsweetened oatmeal. Quinoa. Brown or wild rice. Meats and other proteins Seafood. Poultry without skin. Lean cuts of poultry and beef. Tofu. Nuts. Seeds. Dairy Low-fat or fat-free dairy products such as milk, yogurt, and cheese. The items listed above may not be a complete list of foods and beverages you can eat and drink. Contact a dietitian for more information. What foods should I avoid? Fruits Fruits canned with syrup. Vegetables Canned vegetables. Frozen vegetables with butter or  cream sauce. Grains Refined white flour and flour products such as bread, pasta, snack foods, and cereals. Avoid all processed foods. Meats and other proteins Fatty cuts of meat. Poultry with skin. Breaded or fried meats. Processed meat. Avoid saturated fats. Dairy Full-fat yogurt, cheese, or milk. Beverages Sweetened drinks, such as soda or iced tea. The items listed above may not be a complete list of foods and beverages you should avoid. Contact a dietitian for more information. Questions to ask a health care provider Do I need to meet with a certified diabetes care and education specialist? Do I need to meet with a dietitian? What number can I call if I  have questions? When are the best times to check my blood glucose? Where to find more information: American Diabetes Association: diabetes.org Academy of Nutrition and Dietetics: eatright.[employer] of Diabetes and Digestive and Kidney Diseases: [employer] Association of Diabetes Care & Education Specialists: diabeteseducator.org Summary It is important to have healthy eating habits because your blood sugar (glucose) levels are greatly affected by what you eat and drink. It is important to use alcohol carefully. A healthy meal plan will help you manage your blood glucose and lower your risk of heart disease. Your health care provider may recommend that you work with a dietitian to make a meal plan that is best for you. This information is not intended to replace advice given to you by your health care provider. Make sure you discuss any questions you have with your health care provider. Document Revised: 05/17/2020 Document Reviewed: 05/17/2020 Elsevier Patient Education  [place].

## 2022-08-06 NOTE — Patient Outreach (Signed)
  Care Coordination   Follow Up Visit Note   08/06/2022 Name: Debra [name] MRN: [id number] DOB: 06/01/1956  Debra [name] is a 66 y.o. year old female who sees Carollee Herter, Alferd Apa, DO for primary care. I spoke with  Debra [name] by phone today.  What matters to the patients health and wellness today?  Patient with many questions about what to eat related to her diabetes, she states she has been to nutritionist and also follow up from embedded clinical pharmacist.    Goals Addressed             This Visit's Progress    Care coordination: Diabetes management       Care Coordination Interventions: Advised patient to eat health per dietician recommendation, eat three meals plus snacks Reviewed Blood sugar target goals with patient: 80-130 before meals and <180 about 2 hour after a meal Discussed Target blood sugar range 80-130 before am meal and <180 2 hour after a meal Collaboration with Nutritionist, Isidore Moos regarding follow up and patient recommendation. Patient to follow up once she has started to implement some of the nutrition recommendations made from her healthcare team, once she has implemented changes and is managing those changes, can move forward with more diabetes in other areas. Reviewed after visit summary instructions from Nutritionist and clinical pharmacist Discussed group diabetes/Nutrition classes available also. Encouraged to check blood sugars and discussed importance of recording blood sugars.      SDOH assessments and interventions completed:  No  Care Coordination Interventions Activated:  Yes  Care Coordination Interventions:  Yes, provided   Follow up plan: Follow up call scheduled for 09/02/22    Encounter Outcome:  Pt. Visit Completed   Thea Silversmith, RN, MSN, BSN, [place] Coordinator [phone]

## 2022-08-07 DIAGNOSIS — E119 Type 2 diabetes mellitus without complications: Secondary | ICD-10-CM | POA: Diagnosis not present

## 2022-08-07 DIAGNOSIS — H40023 Open angle with borderline findings, high risk, bilateral: Secondary | ICD-10-CM | POA: Diagnosis not present

## 2022-08-07 DIAGNOSIS — H25813 Combined forms of age-related cataract, bilateral: Secondary | ICD-10-CM | POA: Diagnosis not present

## 2022-08-07 DIAGNOSIS — H25812 Combined forms of age-related cataract, left eye: Secondary | ICD-10-CM | POA: Diagnosis not present

## 2022-08-09 ENCOUNTER — Encounter: Payer: Self-pay | Admitting: Family Medicine

## 2022-08-09 ENCOUNTER — Ambulatory Visit (INDEPENDENT_AMBULATORY_CARE_PROVIDER_SITE_OTHER): Payer: Medicare Other | Admitting: Family Medicine

## 2022-08-09 VITALS — BP 130/80 | HR 70 | Temp 98.3°F | Resp 18 | Ht 63.0 in | Wt 168.6 lb

## 2022-08-09 DIAGNOSIS — E039 Hypothyroidism, unspecified: Secondary | ICD-10-CM

## 2022-08-09 DIAGNOSIS — R3589 Other polyuria: Secondary | ICD-10-CM | POA: Diagnosis not present

## 2022-08-09 DIAGNOSIS — E7800 Pure hypercholesterolemia, unspecified: Secondary | ICD-10-CM | POA: Diagnosis not present

## 2022-08-09 DIAGNOSIS — D509 Iron deficiency anemia, unspecified: Secondary | ICD-10-CM

## 2022-08-09 DIAGNOSIS — K120 Recurrent oral aphthae: Secondary | ICD-10-CM

## 2022-08-09 DIAGNOSIS — Z0000 Encounter for general adult medical examination without abnormal findings: Secondary | ICD-10-CM

## 2022-08-09 DIAGNOSIS — G8929 Other chronic pain: Secondary | ICD-10-CM

## 2022-08-09 DIAGNOSIS — I10 Essential (primary) hypertension: Secondary | ICD-10-CM

## 2022-08-09 DIAGNOSIS — E1165 Type 2 diabetes mellitus with hyperglycemia: Secondary | ICD-10-CM | POA: Diagnosis not present

## 2022-08-09 DIAGNOSIS — B370 Candidal stomatitis: Secondary | ICD-10-CM | POA: Diagnosis not present

## 2022-08-09 DIAGNOSIS — F411 Generalized anxiety disorder: Secondary | ICD-10-CM | POA: Diagnosis not present

## 2022-08-09 DIAGNOSIS — E785 Hyperlipidemia, unspecified: Secondary | ICD-10-CM | POA: Diagnosis not present

## 2022-08-09 DIAGNOSIS — E1169 Type 2 diabetes mellitus with other specified complication: Secondary | ICD-10-CM

## 2022-08-09 DIAGNOSIS — Z720 Tobacco use: Secondary | ICD-10-CM | POA: Diagnosis not present

## 2022-08-09 HISTORY — DX: Type 2 diabetes mellitus with hyperglycemia: E11.65

## 2022-08-09 MED ORDER — MAGIC MOUTHWASH W/LIDOCAINE: 5.0000 mL | Freq: Four times a day (QID) | ORAL | 0 refills | Status: DC | PRN

## 2022-08-09 MED ORDER — HYDROCHLOROTHIAZIDE 25 MG PO TABS: 25.0000 mg | ORAL_TABLET | Freq: Every day | ORAL | 2 refills | Status: DC

## 2022-08-09 MED ORDER — GABAPENTIN 600 MG PO TABS: 600.0000 mg | ORAL_TABLET | Freq: Three times a day (TID) | ORAL | 2 refills | Status: DC

## 2022-08-09 MED ORDER — NICOTINE 14 MG/24HR TD PT24: 14.0000 mg | MEDICATED_PATCH | Freq: Every day | TRANSDERMAL | 0 refills | Status: DC

## 2022-08-09 MED ORDER — METOPROLOL SUCCINATE ER 100 MG PO TB24: 100.0000 mg | ORAL_TABLET | Freq: Every day | ORAL | 1 refills | Status: DC

## 2022-08-09 MED ORDER — PAROXETINE HCL 40 MG PO TABS: 40.0000 mg | ORAL_TABLET | Freq: Every morning | ORAL | 0 refills | Status: DC

## 2022-08-09 MED ORDER — OXYBUTYNIN CHLORIDE ER 10 MG PO TB24: 10.0000 mg | ORAL_TABLET | Freq: Every day | ORAL | 2 refills | Status: DC

## 2022-08-09 NOTE — Assessment & Plan Note (Signed)
Check labs 

## 2022-08-09 NOTE — Assessment & Plan Note (Signed)
Pt encouraged to quit smoking

## 2022-08-09 NOTE — Progress Notes (Signed)
Subjective:   By signing my name below, I, Debra [name], attest that this documentation has been prepared under the direction and in the presence of Debra [name], 08/09/2022.   Patient ID: Debra [name], female    DOB: 12/17/1955, 66 y.o.   MRN: [id number]  Chief Complaint  Patient presents with   Annual Exam    Pt states fasting     HPI Patient is in today for a comprehensive physical exam.  She denies new moles, itching, chills, fever, hearing loss, sinus pain, congestion, sore throat, cough and hemoptysis, chest pain, palpitations, wheezing, constipation, diarrhea, blood in stool, nausea and vomiting, dysuria, frequency, hematuria, myalgias and joint pain, depression, anxiety.  Refill-She is requesting refills on 600 mg gabapentin, 25 mg hydrodiuril, 100 mg Toprol-Xl, 14 mg Nicoderm CQ, 10 mg Ditropan-Xl, and 40 mg Paxil.   Pain- She is complaining of mouth and tongue pain. She reports to feel a burning sensation when eating.   Blood sugar- Patient reports that her blood sugar varies dramatically. Lab Results  Component Value Date   HGBA1C 6.8 (H) 01/01/2022  Diabetes- Patient had normal results during the monofilament test on feet. Social history- She reports that she smokes and is requesting for patches to quit smoking. Pap smear last completed on 05/16/2020. Mammogram last completed on 06/19/2021. Dexa imaging last completed on 06/19/2021.  Vision- She is UTD on vision care. She has an upcoming appointment for a cataract surgery. Dental- She is not UTD on dental care. Past Medical History:  Diagnosis Date   Abdominal pain, right upper quadrant 04/30/2007   Allergies 02/03/2019   Allergy    Anemia, iron deficiency 04/17/2017   Aortic atherosclerosis 10/10/2017   Atypical chest pain 08/31/2014   Back pain with radiculopathy 01/25/2010   Check xray --- ? Cause of leg weakness   Chronic pain of both shoulders 10/31/2020   Constipation    DDD (degenerative  disc disease)    Diabetes mellitus type II, uncontrolled 86[birth year]76/1950   Diastolic dysfunction 93/26/7124   Epilepsy    Childhood seziures; pt reports that she grew out of them and they have not occurred since childhood   Essential hypertension 08/31/2010   Poorly controlled will alter medications, encouraged DASH diet, minimize caffeine and obtain adequate sleep. Report concerning symptoms and follow up as directed and as needed [employer] metoprolol Edema may be c   Fatigue 01/25/2010   Generalized anxiety disorder 04/22/2018   Stable co'nt meds   GERD (gastroesophageal reflux disease) 07/16/2007   Hearing loss 07/24/2012   Refer to hearing clinic   Hiatal hernia 07/16/2007   Hyperglycemia, fasting 05/04/2010   Hyperlipidemia 04/17/2017   Encouraged heart healthy diet, increase exercise, avoid trans fats, consider a krill oil cap daily   Hypothyroidism 11/02/2010   con't meds; Lab Results Component Value TSH 1.44 08/12/2017   Insomnia 04/30/2007   Knee pain 07/16/2007   Leg pain, bilateral 11/17/2008   Low back pain 07/16/2007   Major depressive disorder 01/30/2018   con't meds F/u counselor   Mixed connective tissue disease    with Raynaud's   Obstructive sleep apnea 01/30/2018   F/u with pulm; May be cause of some of her memory/concentration problems   Osteoarthritis    Osteoporosis    Palpitations 02/12/2008   Pansinusitis 01/30/2018   Peripheral vertigo 04/30/2007   Resolved rto prn   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    Family History  Problem  Relation Age of Onset   Breast cancer Mother        possibly 55's   Arthritis Mother        rheumatoid   Cancer Mother        breast   Heart disease Mother 23       MI   Hyperlipidemia Mother    Thyroid disease Mother    Depression Mother    Anxiety disorder Mother    Heart disease Father        MI   Hypertension Father    Stroke Father    Diabetes Sister    Hypertension Sister    Hyperlipidemia Sister    Memory loss Maternal Aunt    Colon cancer Maternal Grandfather    Esophageal cancer Neg Hx    Rectal cancer Neg Hx    Stomach cancer Neg Hx     Social History   Socioeconomic History   Marital status: Legally Separated    Spouse name: Not on file   Number of children: 3   Years of education: 14   Highest education level: Some college, no degree  Occupational History   Occupation: Security  Tobacco Use   Smoking status: Every Day    Packs/day: 0.75    Years: 44.00    Total pack years: 33.00    Types: Cigarettes   Smokeless tobacco: Never  Substance and Sexual Activity   Alcohol use: Yes    Comment: 2-3 drinks per week (beer/wine)   Drug use: No   Sexual activity: Yes    Partners: Male    Birth control/protection: Post-menopausal  Other Topics Concern   Not on file  Social History Narrative   Not on file   Social Determinants of Health   Financial Resource Strain: Medium Risk (07/02/2022)   Overall Financial Resource Strain (CARDIA)    Difficulty of Paying Living Expenses: Somewhat hard  Food Insecurity: No Food Insecurity (07/02/2022)   Hunger Vital Sign    Worried About Running Out of Food in the Last Year: Never true    Ran Out of Food in the Last Year: Never true  Transportation Needs: No Transportation Needs (07/02/2022)   PRAPARE - Hydrologist (Medical): No    Lack of Transportation (Non-Medical): No  Physical Activity: Insufficiently Active (04/17/2022)   Exercise Vital Sign    Days of Exercise per Week: 1 day     Minutes of Exercise per Session: 40 min  Stress: No Stress Concern Present (12/28/2021)   Dante    Feeling of Stress : Only a little  Social Connections: Moderately Isolated (12/28/2021)   Social Connection and Isolation Panel [NHANES]    Frequency of Communication with Friends and Family: More than three times a week    Frequency of Social Gatherings with Friends and Family: Once a week    Attends Religious Services: More than 4 times per year  Active Member of Clubs or Organizations: No    Attends Archivist Meetings: Never    Marital Status: Never married  Intimate Partner Violence: Not At Risk (12/28/2021)   Humiliation, Afraid, Rape, and Kick questionnaire    Fear of Current or Ex-Partner: No    Emotionally Abused: No    Physically Abused: No    Sexually Abused: No    Outpatient Medications Prior to Visit  Medication Sig Dispense Refill   Accu-Chek Softclix Lancets lancets Use to check blood glucose once a day (DX: type 2 DM E11.65) 100 each 3   azelastine (ASTELIN) 0.1 % nasal spray Place 2 sprays into both nostrils 2 (two) times daily. Use in each nostril as directed 30 mL 5   blood glucose meter kit and supplies KIT Dispense based on patient and insurance preference. Use up to four times daily as directed. (FOR ICD-9 250.00, 250.01). 1 each 0   Blood Glucose Monitoring Suppl (ACCU-CHEK GUIDE) w/Device KIT Use to check blood glucose daily (DX: type 2 DM E 11.65) 1 kit 0   Continuous Blood Gluc Sensor (FREESTYLE LIBRE 2 SENSOR) MISC Use to check blood glucose continuously - change every 14 days. 2 each 2   fluconazole (DIFLUCAN) 150 MG tablet Take 1 tab, repeat in 72 hours if no improvement. 2 tablet 0   glucose blood (ACCU-CHEK GUIDE) test strip Use to check blood glucose once a day (Dx: type 2 DM / E11.65) 100 each 3   levothyroxine (SYNTHROID) 50 MCG tablet TAKE 1 TABLET BY MOUTH DAILY 100 tablet 2    losartan (COZAAR) 100 MG tablet TAKE 1 TABLET BY MOUTH ONCE  DAILY 100 tablet 2   metFORMIN (GLUCOPHAGE) 500 MG tablet TAKE 1 TABLET BY MOUTH DAILY  WITH BREAKFAST 90 tablet 1   montelukast (SINGULAIR) 10 MG tablet Take 1 tablet (10 mg total) by mouth at bedtime. 90 tablet 1   NON [place]  Scar Cream -  Verapamil 10%, Pentoxifylline 5% Apply 1-2 grams to affected area 3-4 times daily Qty. 120 gm 3 refills     pravastatin (PRAVACHOL) 40 MG tablet Take 1 tablet (40 mg total) by mouth daily. For lowering cholesterol and heart health 90 tablet 3   gabapentin (NEURONTIN) 600 MG tablet TAKE 1 TABLET BY MOUTH 3  TIMES DAILY 300 tablet 2   hydrochlorothiazide (HYDRODIURIL) 25 MG tablet TAKE 1 TABLET BY MOUTH DAILY 90 tablet 2   metoprolol succinate (TOPROL-XL) 100 MG 24 hr tablet Take 1 tablet (100 mg total) by mouth daily. Take with or immediately following a meal. 90 tablet 1   nicotine (NICODERM CQ - DOSED IN MG/24 HOURS) 14 mg/24hr patch Place 1 patch (14 mg total) onto the skin daily. Remove and apply new patch each morning. 28 patch 0   oxybutynin (DITROPAN-XL) 10 MG 24 hr tablet Take 1 tablet (10 mg total) by mouth at bedtime. 30 tablet 2   PARoxetine (PAXIL) 40 MG tablet Take 1 tablet (40 mg total) by mouth every morning. 90 tablet 0   No facility-administered medications prior to visit.    Allergies  Allergen Reactions   Penicillins    Codeine Rash and Other (See Comments)    dizziness    Review of Systems  Constitutional:  Negative for chills, fever and malaise/fatigue.  HENT:  Negative for congestion, sinus pain and sore throat.   Eyes:  Negative for blurred vision.  Respiratory:  Negative for cough, hemoptysis, shortness of breath and wheezing.  Cardiovascular:  Negative for chest pain, palpitations and leg swelling.  Gastrointestinal:  Negative for abdominal pain, blood in stool, constipation, diarrhea, nausea and vomiting.  Genitourinary:  Negative for  dysuria, frequency and hematuria.  Musculoskeletal:  Negative for falls, joint pain and myalgias.  Skin:  Negative for itching and rash.       (-) new moles  Neurological:  Negative for dizziness, loss of consciousness and headaches.  Endo/Heme/Allergies:  Negative for environmental allergies.  Psychiatric/Behavioral:  Negative for depression. The patient is not nervous/anxious.        Objective:    Physical Exam Vitals and nursing note reviewed.  Constitutional:      General: She is not in acute distress.    Appearance: Normal appearance. She is not ill-appearing.  HENT:     Head: Normocephalic and atraumatic.     Right Ear: Tympanic membrane, ear canal and external ear normal.     Left Ear: Tympanic membrane, ear canal and external ear normal.     Nose: Nose normal.     Mouth/Throat:     Mouth: Mucous membranes are moist.     Pharynx: Oropharynx is clear. No oropharyngeal exudate or posterior oropharyngeal erythema.  Eyes:     Extraocular Movements: Extraocular movements intact.     Conjunctiva/sclera: Conjunctivae normal.     Pupils: Pupils are equal, round, and reactive to light.  Cardiovascular:     Rate and Rhythm: Normal rate and regular rhythm.     Heart sounds: Normal heart sounds. No murmur heard.    No gallop.  Pulmonary:     Effort: Pulmonary effort is normal. No respiratory distress.     Breath sounds: Normal breath sounds. No wheezing or rales.  Abdominal:     General: Bowel sounds are normal. There is no distension.     Palpations: Abdomen is soft.     Tenderness: There is no abdominal tenderness. There is no guarding.  Musculoskeletal:        General: No swelling or tenderness. Normal range of motion.     Cervical back: Normal range of motion and neck supple.     Right lower leg: No edema.     Left lower leg: No edema.  Lymphadenopathy:     Cervical: No cervical adenopathy.  Skin:    General: Skin is warm and dry.  Neurological:     Mental Status: She  is alert and oriented to person, place, and time.  Psychiatric:        Judgment: Judgment normal.     BP 130/80 (BP Location: Right Arm, Patient Position: Sitting, Cuff Size: Normal)   Pulse 70   Temp 98.3 F (36.8 C) (Oral)   Resp 18   Ht 5' 3" (1.6 m)   Wt 168 lb 9.6 oz (76.5 kg)   SpO2 97%   BMI 29.87 kg/m  Wt Readings from Last 3 Encounters:  08/09/22 168 lb 9.6 oz (76.5 kg)  07/23/22 170 lb (77.1 kg)  07/19/22 165 lb (74.8 kg)    Diabetic Foot Exam - Simple   Simple Foot Form Diabetic Foot exam was performed with the following findings: Yes 08/09/2022  1:52 PM  Visual Inspection No deformities, no ulcerations, no other skin breakdown bilaterally: Yes Sensation Testing Intact to touch and monofilament testing bilaterally: Yes Pulse Check Posterior Tibialis and Dorsalis pulse intact bilaterally: Yes Comments    Lab Results  Component Value Date   WBC 5.8 05/25/2021   HGB 11.6 (L) 05/25/2021  HCT 35.4 05/25/2021   PLT 260 05/25/2021   GLUCOSE 77 01/01/2022   CHOL 183 01/01/2022   TRIG 185.0 (H) 01/01/2022   HDL 51.80 01/01/2022   LDLDIRECT 126.3 06/06/2011   LDLCALC 94 01/01/2022   ALT 12 01/01/2022   AST 20 01/01/2022   NA 138 01/01/2022   K 4.1 01/01/2022   CL 102 01/01/2022   CREATININE 0.78 01/01/2022   BUN 16 01/01/2022   CO2 29 01/01/2022   TSH 1.32 01/01/2022   INR 1.0 05/25/2021   HGBA1C 6.8 (H) 01/01/2022   MICROALBUR <0.7 01/01/2022    Lab Results  Component Value Date   TSH 1.32 01/01/2022   Lab Results  Component Value Date   WBC 5.8 05/25/2021   HGB 11.6 (L) 05/25/2021   HCT 35.4 05/25/2021   MCV 84.5 05/25/2021   PLT 260 05/25/2021   Lab Results  Component Value Date   NA 138 01/01/2022   K 4.1 01/01/2022   CO2 29 01/01/2022   GLUCOSE 77 01/01/2022   BUN 16 01/01/2022   CREATININE 0.78 01/01/2022   BILITOT 0.4 01/01/2022   ALKPHOS 89 01/01/2022   AST 20 01/01/2022   ALT 12 01/01/2022   PROT 7.5 01/01/2022   ALBUMIN  4.0 01/01/2022   CALCIUM 9.8 01/01/2022   ANIONGAP 11 08/31/2014   GFR 79.14 01/01/2022   Lab Results  Component Value Date   CHOL 183 01/01/2022   Lab Results  Component Value Date   HDL 51.80 01/01/2022   Lab Results  Component Value Date   LDLCALC 94 01/01/2022   Lab Results  Component Value Date   TRIG 185.0 (H) 01/01/2022   Lab Results  Component Value Date   CHOLHDL 4 01/01/2022   Lab Results  Component Value Date   HGBA1C 6.8 (H) 01/01/2022       Assessment & Plan:   Problem List Items Addressed This Visit       Unprioritized   Generalized anxiety disorder   Relevant Medications   PARoxetine (PAXIL) 40 MG tablet   Polyuria   Relevant Medications   oxybutynin (DITROPAN-XL) 10 MG 24 hr tablet   Type 2 diabetes mellitus with hyperglycemia, without long-term current use of insulin (HCC)    hgba 1c to be done.   minimize simple carbs. Increase exercise as tolerated. Continue current meds       Relevant Orders   CBC with Differential/Platelet   Comprehensive metabolic panel   Hemoglobin A1c   Lipid panel   TSH   Microalbumin / creatinine urine ratio   VITAMIN D 25 Hydroxy (Vit-D Deficiency, Fractures)   Tobacco abuse    Pt encouraged to quit smoking       Relevant Medications   nicotine (NICODERM CQ - DOSED IN MG/24 HOURS) 14 mg/24hr patch   Hypothyroidism    Check labs  con't synthroid       Relevant Medications   metoprolol succinate (TOPROL-XL) 100 MG 24 hr tablet   Other Relevant Orders   TSH   Hyperlipidemia    Encourage heart healthy diet such as MIND or DASH diet, increase exercise, avoid trans fats, simple carbohydrates and processed foods, consider a krill or fish or flaxseed oil cap daily.       Relevant Medications   metoprolol succinate (TOPROL-XL) 100 MG 24 hr tablet   hydrochlorothiazide (HYDRODIURIL) 25 MG tablet   Essential hypertension    Well controlled, no changes to meds. Encouraged heart healthy diet such as the DASH  diet and exercise as tolerated.       Relevant Medications   metoprolol succinate (TOPROL-XL) 100 MG 24 hr tablet   hydrochlorothiazide (HYDRODIURIL) 25 MG tablet   Other Relevant Orders   CBC with Differential/Platelet   Comprehensive metabolic panel   Hemoglobin A1c   Lipid panel   TSH   Microalbumin / creatinine urine ratio   VITAMIN D 25 Hydroxy (Vit-D Deficiency, Fractures)   Anemia, iron deficiency    Check labs      Other Visit Diagnoses     Preventative health care    -  Primary   Hyperlipidemia associated with type 2 diabetes mellitus (HCC)       Relevant Medications   metoprolol succinate (TOPROL-XL) 100 MG 24 hr tablet   hydrochlorothiazide (HYDRODIURIL) 25 MG tablet   Other Relevant Orders   Comprehensive metabolic panel   Lipid panel   Primary hypertension       Relevant Medications   metoprolol succinate (TOPROL-XL) 100 MG 24 hr tablet   hydrochlorothiazide (HYDRODIURIL) 25 MG tablet   Other chronic pain       Relevant Medications   PARoxetine (PAXIL) 40 MG tablet   gabapentin (NEURONTIN) 600 MG tablet   Thrush       Relevant Medications   magic mouthwash w/lidocaine SOLN   Aphthous stomatitis       Relevant Medications   magic mouthwash w/lidocaine SOLN       Meds ordered this encounter  Medications   PARoxetine (PAXIL) 40 MG tablet    Sig: Take 1 tablet (40 mg total) by mouth every morning.    Dispense:  90 tablet    Refill:  0   nicotine (NICODERM CQ - DOSED IN MG/24 HOURS) 14 mg/24hr patch    Sig: Place 1 patch (14 mg total) onto the skin daily. Remove and apply new patch each morning.    Dispense:  28 patch    Refill:  0   metoprolol succinate (TOPROL-XL) 100 MG 24 hr tablet    Sig: Take 1 tablet (100 mg total) by mouth daily. Take with or immediately following a meal.    Dispense:  90 tablet    Refill:  1   oxybutynin (DITROPAN-XL) 10 MG 24 hr tablet    Sig: Take 1 tablet (10 mg total) by mouth at bedtime.    Dispense:  30 tablet     Refill:  2   hydrochlorothiazide (HYDRODIURIL) 25 MG tablet    Sig: Take 1 tablet (25 mg total) by mouth daily.    Dispense:  90 tablet    Refill:  2    Requesting 1 year supply   gabapentin (NEURONTIN) 600 MG tablet    Sig: Take 1 tablet (600 mg total) by mouth 3 (three) times daily.    Dispense:  300 tablet    Refill:  2    Requesting 1 year supply   magic mouthwash w/lidocaine SOLN    Sig: Take 5 mLs by mouth 4 (four) times daily as needed for mouth pain. Swish and Spit    Dispense:  240 mL    Refill:  0    ([facility] w/lidocaine 1:1) Diphenhydramine (12.50m/5ml) 1012mNystatin (100,000 units/2m25m 70m77mdocaine 2% solution, 120ml63mI, LowneCarollee HerternnKendrick Friessonally preformed the services described in this documentation.  All medical record entries made by the scribe were at my direction and in my presence.  I have reviewed the chart and discharge instructions (if applicable) and  agree that the record reflects my personal performance and is accurate and complete. 08/09/2022.  I, R Lowne Chase,acting as a scribe for [employer], DO.,have documented all relevant documentation on the behalf of Ann Held, DO,as directed by  Ann Held, DO while in the presence of Ann Held, DO.    Ann Held, DO

## 2022-08-09 NOTE — Assessment & Plan Note (Signed)
hgba1c to be done minimize simple carbs. Increase exercise as tolerated. Continue current meds  

## 2022-08-09 NOTE — Assessment & Plan Note (Signed)
Check labs con't synthroid 

## 2022-08-09 NOTE — Patient Instructions (Signed)
Preventive Care 65 Years and Older, Female Preventive care refers to lifestyle choices and visits with your health care provider that can promote health and wellness. Preventive care visits are also called wellness exams. What can I expect for my preventive care visit? Counseling Your health care provider may ask you questions about your: Medical history, including: Past medical problems. Family medical history. Pregnancy and menstrual history. History of falls. Current health, including: Memory and ability to understand (cognition). Emotional well-being. Home life and relationship well-being. Sexual activity and sexual health. Lifestyle, including: Alcohol, nicotine or tobacco, and drug use. Access to firearms. Diet, exercise, and sleep habits. Work and work environment. Sunscreen use. Safety issues such as seatbelt and bike helmet use. Physical exam Your health care provider will check your: Height and weight. These may be used to calculate your BMI (body mass index). BMI is a measurement that tells if you are at a healthy weight. Waist circumference. This measures the distance around your waistline. This measurement also tells if you are at a healthy weight and may help predict your risk of certain diseases, such as type 2 diabetes and high blood pressure. Heart rate and blood pressure. Body temperature. Skin for abnormal spots. What immunizations do I need?  Vaccines are usually given at various ages, according to a schedule. Your health care provider will recommend vaccines for you based on your age, medical history, and lifestyle or other factors, such as travel or where you work. What tests do I need? Screening Your health care provider may recommend screening tests for certain conditions. This may include: Lipid and cholesterol levels. Hepatitis C test. Hepatitis B test. HIV (human immunodeficiency virus) test. STI (sexually transmitted infection) testing, if you are at  risk. Lung cancer screening. Colorectal cancer screening. Diabetes screening. This is done by checking your blood sugar (glucose) after you have not eaten for a while (fasting). Mammogram. Talk with your health care provider about how often you should have regular mammograms. BRCA-related cancer screening. This may be done if you have a family history of breast, ovarian, tubal, or peritoneal cancers. Bone density scan. This is done to screen for osteoporosis. Talk with your health care provider about your test results, treatment options, and if necessary, the need for more tests. Follow these instructions at home: Eating and drinking  Eat a diet that includes fresh fruits and vegetables, whole grains, lean protein, and low-fat dairy products. Limit your intake of foods with high amounts of sugar, saturated fats, and salt. Take vitamin and mineral supplements as recommended by your health care provider. Do not drink alcohol if your health care provider tells you not to drink. If you drink alcohol: Limit how much you have to 0-1 drink a day. Know how much alcohol is in your drink. In the [place] one drink equals one 12 oz bottle of beer (355 mL), one 5 oz glass of wine (148 mL), or one 1 oz glass of hard liquor (44 mL). Lifestyle Brush your teeth every morning and night with fluoride toothpaste. Floss one time each day. Exercise for at least 30 minutes 5 or more days each week. Do not use any products that contain nicotine or tobacco. These products include cigarettes, chewing tobacco, and vaping devices, such as e-cigarettes. If you need help quitting, ask your health care provider. Do not use drugs. If you are sexually active, practice safe sex. Use a condom or other form of protection in order to prevent STIs. Take aspirin only as told by   your health care provider. Make sure that you understand how much to take and what form to take. Work with your health care provider to find out whether it  is safe and beneficial for you to take aspirin daily. Ask your health care provider if you need to take a cholesterol-lowering medicine (statin). Find healthy ways to manage stress, such as: Meditation, yoga, or listening to music. Journaling. Talking to a trusted person. Spending time with friends and family. Minimize exposure to UV radiation to reduce your risk of skin cancer. Safety Always wear your seat belt while driving or riding in a vehicle. Do not drive: If you have been drinking alcohol. Do not ride with someone who has been drinking. When you are tired or distracted. While texting. If you have been using any mind-altering substances or drugs. Wear a helmet and other protective equipment during sports activities. If you have firearms in your house, make sure you follow all gun safety procedures. What's next? Visit your health care provider once a year for an annual wellness visit. Ask your health care provider how often you should have your eyes and teeth checked. Stay up to date on all vaccines. This information is not intended to replace advice given to you by your health care provider. Make sure you discuss any questions you have with your health care provider. Document Revised: 04/11/2021 Document Reviewed: 04/11/2021 Elsevier Patient Education  2023 [employer]  

## 2022-08-09 NOTE — Assessment & Plan Note (Signed)
Well controlled, no changes to meds. Encouraged heart healthy diet such as the DASH diet and exercise as tolerated.  °

## 2022-08-09 NOTE — Assessment & Plan Note (Signed)
Encourage heart healthy diet such as MIND or DASH diet, increase exercise, avoid trans fats, simple carbohydrates and processed foods, consider a krill or fish or flaxseed oil cap daily.  °

## 2022-08-10 LAB — CBC WITH DIFFERENTIAL/PLATELET
Absolute Monocytes: 520 cells/uL (ref 200–950)
Basophils Absolute: 20 cells/uL (ref 0–200)
Basophils Relative: 0.4 %
Eosinophils Absolute: 71 cells/uL (ref 15–500)
Eosinophils Relative: 1.4 %
HCT: 32.5 % — ABNORMAL LOW (ref 35.0–45.0)
Hemoglobin: 10.9 g/dL — ABNORMAL LOW (ref 11.7–15.5)
Lymphs Abs: 2407 cells/uL (ref 850–3900)
MCH: 28.5 pg (ref 27.0–33.0)
MCHC: 33.5 g/dL (ref 32.0–36.0)
MCV: 84.9 fL (ref 80.0–100.0)
MPV: 10.3 fL (ref 7.5–12.5)
Monocytes Relative: 10.2 %
Neutro Abs: 2081 cells/uL (ref 1500–7800)
Neutrophils Relative %: 40.8 %
Platelets: 254 10*3/uL (ref 140–400)
RBC: 3.83 10*6/uL (ref 3.80–5.10)
RDW: 12.9 % (ref 11.0–15.0)
Total Lymphocyte: 47.2 %
WBC: 5.1 10*3/uL (ref 3.8–10.8)

## 2022-08-10 LAB — COMPREHENSIVE METABOLIC PANEL
AG Ratio: 1 (calc) (ref 1.0–2.5)
ALT: 13 U/L (ref 6–29)
AST: 20 U/L (ref 10–35)
Albumin: 3.7 g/dL (ref 3.6–5.1)
Alkaline phosphatase (APISO): 96 U/L (ref 37–153)
BUN: 17 mg/dL (ref 7–25)
CO2: 26 mmol/L (ref 20–32)
Calcium: 9.7 mg/dL (ref 8.6–10.4)
Chloride: 104 mmol/L (ref 98–110)
Creat: 0.74 mg/dL (ref 0.50–1.05)
Globulin: 3.7 g/dL (calc) (ref 1.9–3.7)
Glucose, Bld: 139 mg/dL — ABNORMAL HIGH (ref 65–99)
Potassium: 4.8 mmol/L (ref 3.5–5.3)
Sodium: 139 mmol/L (ref 135–146)
Total Bilirubin: 0.5 mg/dL (ref 0.2–1.2)
Total Protein: 7.4 g/dL (ref 6.1–8.1)

## 2022-08-10 LAB — HEMOGLOBIN A1C
Hgb A1c MFr Bld: 7.5 % of total Hgb — ABNORMAL HIGH (ref <5.7)
Mean Plasma Glucose: 169 mg/dL
eAG (mmol/L): 9.3 mmol/L

## 2022-08-10 LAB — MICROALBUMIN / CREATININE URINE RATIO
Creatinine, Urine: 96 mg/dL (ref 20–275)
Microalb Creat Ratio: 3 mcg/mg creat (ref <30)
Microalb, Ur: 0.3 mg/dL

## 2022-08-10 LAB — VITAMIN D 25 HYDROXY (VIT D DEFICIENCY, FRACTURES): Vit D, 25-Hydroxy: 49 ng/mL (ref 30–100)

## 2022-08-10 LAB — LIPID PANEL
Cholesterol: 146 mg/dL (ref <200)
HDL: 47 mg/dL — ABNORMAL LOW (ref >=50)
LDL Cholesterol (Calc): 80 mg/dL (calc)
Non-HDL Cholesterol (Calc): 99 mg/dL (calc) (ref <130)
Total CHOL/HDL Ratio: 3.1 (calc) (ref <5.0)
Triglycerides: 108 mg/dL (ref <150)

## 2022-08-10 LAB — TSH: TSH: 0.67 mIU/L (ref 0.40–4.50)

## 2022-08-13 ENCOUNTER — Ambulatory Visit: Payer: Self-pay | Admitting: Licensed Clinical Social Worker

## 2022-08-13 NOTE — Patient Outreach (Signed)
  Care Coordination   Follow Up Visit Note   08/13/2022 Name: Debra [name] MRN: [id number] DOB: 27-Apr-1956  Debra [name] is a 66 y.o. year old female who sees Carollee Herter, Alferd Apa, DO for primary care. I spoke with  Debra [name] by phone today.  What matters to the patients health and wellness today? Client spoke of job stress and stress related to managing diabetes   Goals Addressed               This Visit's Progress     patient said she has trouble managing her job demands and managing diabetes (pt-stated)        Interventions: Discussed Care Coordination program support with client Discussed client support with RN Thea Silversmith Discussed client support with nutritionist. Client said she had talked with nutritionist and had brochure with information on healthy eating. She has trouble knowing what foods to buy and how to prepare food in way that is beneficial to managing diabetes Collaboration of LCSW with RN Thea Silversmith to discuss client current needs Client spoke of smoking challenges Client and LCSW discussed energy level of client. She spoke of reduced energy level Client and LCSW spoke of her use of Paxil. She said she just got a refill of Paxil. She said she had been taking Paxil for a while and she thinks it is helping her. Client said after she works she is fatigued and had little energy to buy groceries or prepare proper foods to eat Provided counseling support to client     SDOH assessments and interventions completed:  Yes  SDOH Interventions Today    Flowsheet Row Most Recent Value  SDOH Interventions   Depression Interventions/Treatment  Medication, Counseling   Stress issues; Client has stress related to job issues and related to managing Diabetes     Care Coordination Interventions Activated:  Yes  Care Coordination Interventions:  Yes, provided   Follow up plan: Follow up call scheduled for 08/26/22 at 9:30 AM    Encounter Outcome:  Pt.  Visit Completed

## 2022-08-13 NOTE — Patient Outreach (Signed)
  Care Coordination    08/13/2022 Name: Debra [name] MRN: [id number] DOB: [birth year]  Debra [name] is a 66 y.o. year old female who sees Debra [name], Debra Apa, DO for primary care. I  Consultation and Collaboration  reference care coordination for  patient with stress and financial strain . Patient was not interviewed or contacted during this encounter.   Intervention:Conducted brief assessment, recommendations and relevant information discussed.  Collaborated with RN Care Coordinator and Debra [name] previously engaged with patient  to assist with meeting patient's needs.    Recommendation: Patient will be scheduled with previous Debra [name] to assist with managing stress.    SDOH assessments and interventions completed:  No    Care Coordination Interventions Activated:  No  Care Coordination Interventions:  No, not indicated   Follow up plan: Follow up call scheduled for 08/13/22 with Debra [name]. Debra [name]    Encounter Outcome:  Pt. Scheduled   Debra [name], Debra [name] [phone]

## 2022-08-13 NOTE — Patient Instructions (Signed)
Visit Information  Thank you for taking time to visit with me today. Please don't hesitate to contact me if I can be of assistance to you before our next scheduled telephone appointment.  Following are the goals we discussed today:   Our next appointment is by telephone on 08/26/22 at 9:30 AM   Please call the care guide team at [phone] if you need to cancel or reschedule your appointment.   If you are experiencing a Mental Health or [place] or need someone to talk to, please go to [facility] [place] [phone])   Following is a copy of your full plan of care:   Interventions: Discussed Care Coordination program support with client Discussed client support with RN Thea Silversmith Discussed client support with nutritionist. Client said she had talked with nutritionist and had brochure with information on healthy eating. She has trouble knowing what foods to buy and how to prepare food in way that is beneficial to managing diabetes Collaboration of LCSW with RN Thea Silversmith to discuss client current needs Client spoke of smoking challenges Client and LCSW discussed energy level of client. She spoke of reduced energy level Client and LCSW spoke of her use of Paxil. She said she just got a refill of Paxil. She said she had been taking Paxil for a while and she thinks it is helping her. Client said after she works she is fatigued and had little energy to buy groceries or prepare proper foods to eat Provided counseling support to client  Ms. Marken was given information about Care Management services by the embedded care coordination team including:  Care Management services include personalized support from designated clinical staff supervised by her physician, including individualized plan of care and coordination with other care providers 24/7 contact phone numbers for assistance for urgent and routine care  needs. The patient may stop CCM services at any time (effective at the end of the month) by phone call to the office staff.  Patient agreed to services and verbal consent obtained.   Norva Riffle.Lennis Korb MSW, [place] Holiday representative [facility] Care Management [phone]

## 2022-08-14 ENCOUNTER — Ambulatory Visit: Payer: Self-pay | Admitting: Licensed Clinical Social Worker

## 2022-08-14 NOTE — Patient Instructions (Signed)
Visit Information  Thank you for taking time to visit with me today. Please don't hesitate to contact me if I can be of assistance to you before our next scheduled telephone appointment.  Following are the goals we discussed today:   Our next appointment is by telephone on 08/26/22 at 9:30 AM  Please call the care guide team at [phone] if you need to cancel or reschedule your appointment.   If you are experiencing a Mental Health or [place] or need someone to talk to, please go to [facility] [place] [phone])   Following is a copy of your full plan of care:   Interventions: Discussed client needs in POD Case discussion meeting on 08/14/22 POD group discussed psychosocial needs of client, nutritional needs of client, diabetes management of client, and client support with nutritionist POD members discussed client decreased motivation and client mental health needs. (Client has GAD diagnosis). Suggestion that client link to counseling support if she is willing to participate. LCSW Casimer Lanius and LCSW [name] discussed counseling agencies in area that may take new patients and may be able to help client Reviewed decreased family support for client Reviewed food resources of client and her insurance coverage. She has UHC and Medicaid LCSW [name] and RN Thea Silversmith to continue providing Care Coordination program support for client at this time  Ms. Honda was given information about Care Management services by the embedded care coordination team including:  Care Management services include personalized support from designated clinical staff supervised by her physician, including individualized plan of care and coordination with other care providers 24/7 contact phone numbers for assistance for urgent and routine care needs. The patient may stop CCM services at any time (effective at the end of the  month) by phone call to the office staff.  Patient agreed to services and verbal consent obtained.   Norva Riffle.Lillyahna Hemberger MSW, [place] Holiday representative [facility] Care Management [phone]

## 2022-08-14 NOTE — Patient Outreach (Signed)
  Care Coordination   Follow Up Visit Note   08/14/2022 Name: Debra [name] MRN: [id number] DOB: 1956-06-21  Debra [name] is a 66 y.o. year old female who sees Carollee Herter, Alferd Apa, DO for primary care. I spoke with  POD members today in Case Discussion meeting today regarding client needs. .  What matters to the patients health and wellness today?  Client is having difficulty managing job stress and managing Diabetes    Goals Addressed               This Visit's Progress     patient said she has trouble managing her job demands and managing diabetes (pt-stated)        Interventions: Discussed client needs in POD Case discussion meeting on 08/14/22 POD group discussed psychosocial needs of client, nutritional needs of client, diabetes management of client, and client support with nutritionist POD members discussed client decreased motivation and client mental health needs. (Client has GAD diagnosis). Suggestion that client link to counseling support if she is willing to participate. LCSW Casimer Lanius and LCSW [name] discussed counseling agencies in area that may take new patients and may be able to help client Reviewed decreased family support for client Reviewed food resources of client and her insurance coverage. She has UHC and Medicaid LCSW [name] and RN Thea Silversmith to continue providing Care Coordination program support for client at this time    SDOH assessments and interventions completed:  Yes    Care Coordination Interventions Activated:  Yes  Care Coordination Interventions:  Yes, provided   Follow up plan: Follow up call scheduled for 08/26/22 at 9:30 AM    Encounter Outcome:  Pt. Visit Completed

## 2022-08-19 ENCOUNTER — Ambulatory Visit: Payer: Self-pay | Admitting: Licensed Clinical Social Worker

## 2022-08-19 NOTE — Patient Outreach (Signed)
  Care Coordination   Follow Up Visit Note   08/19/2022 Name: Debra [name] MRN: [id number] DOB: [birth year]  Debra [name] is a 66 y.o. year old female who sees Carollee Herter, Alferd Apa, DO for primary care. I spoke with  Debra [name] by phone today.  What matters to the patients health and wellness today? Client has difficulty managing job demands and managing her diabetes    Goals Addressed               This Visit's Progress     patient said she has trouble managing her job demands and managing diabetes (pt-stated)        Interventions:  LCSW talked with client about client needs LCSW provided client with names and phone numbers of 3 counseling agencies in [place], [place] :  [place], [place] and [facility].  Client wrote down names and phone numbers of each counseling center.  She said she would look at her work schedule and see if she could find a good time to schedule appointment for mental health support with one of these counseling agencies Provided counseling support to client Encouraged client to call Delton See, RD to discuss healthy meals for client Encouraged client to contact RN Thea Silversmith as needed for nursing support      SDOH assessments and interventions completed:  Yes  SDOH Interventions Today    Flowsheet Row Most Recent Value  SDOH Interventions   Depression Interventions/Treatment  Counseling  Stress Interventions Provide Counseling  [client has stress about managing diet and managing medical needs]        Care Coordination Interventions Activated:  Yes  Care Coordination Interventions:  Yes, provided   Follow up plan: Follow up call scheduled for 08/27/22 at 9:30 AM    Encounter Outcome:  Pt. Visit Completed

## 2022-08-19 NOTE — Patient Instructions (Signed)
Visit Information  Thank you for taking time to visit with me today. Please don't hesitate to contact me if I can be of assistance to you before our next scheduled telephone appointment.  Following are the goals we discussed today:    Our next appointment is by telephone on 08/27/22 at 9:30 AM   Please call the care guide team at [phone] if you need to cancel or reschedule your appointment.   If you are experiencing a Mental Health or [place] or need someone to talk to, please go to [facility] [place] [phone])   Following is a copy of your full plan of care:   Interventions:  LCSW talked with client about client needs LCSW provided client with names and phone numbers of 3 counseling agencies in [place], [place] :  [place], [place] and [facility].  Client wrote down names and phone numbers of each counseling center.  She said she would look at her work schedule and see if she could find a good time to schedule appointment for mental health support with one of these counseling agencies Provided counseling support to client Encouraged client to call Delton See, RD to discuss healthy meals for client Encouraged client to contact RN Thea Silversmith as needed for nursing support  Ms. Loschiavo was given information about Care Management services by the embedded care coordination team including:  Care Management services include personalized support from designated clinical staff supervised by her physician, including individualized plan of care and coordination with other care providers 24/7 contact phone numbers for assistance for urgent and routine care needs. The patient may stop CCM services at any time (effective at the end of the month) by phone call to the office staff.  Patient agreed to services and verbal consent obtained.   Norva Riffle.Franciszek Platten MSW, [place] Holiday representative [facility] Care Management [phone]

## 2022-08-21 ENCOUNTER — Ambulatory Visit (INDEPENDENT_AMBULATORY_CARE_PROVIDER_SITE_OTHER): Payer: Medicare Other | Admitting: Pharmacist

## 2022-08-21 DIAGNOSIS — I10 Essential (primary) hypertension: Secondary | ICD-10-CM

## 2022-08-21 DIAGNOSIS — E1165 Type 2 diabetes mellitus with hyperglycemia: Secondary | ICD-10-CM

## 2022-08-21 MED ORDER — METFORMIN HCL ER 500 MG PO TB24: 1000.0000 mg | ORAL_TABLET | Freq: Every day | ORAL | 3 refills | Status: DC

## 2022-08-21 NOTE — Progress Notes (Signed)
Pharmacy Note  08/21/2022 Name: Debra [name] MRN: [id number] DOB: May 12, 1956  Subjective: Debra [name] is a 66 y.o. year old female who is a primary care patient of Carollee Herter, Alferd Apa, DO. Clinical Pharmacist Practitioner referral was placed to assist with medication and diabetes management.    Engaged with patient by telephone for follow up visit today.  Type 2 DM - A1c has increased. Possibly related to recent prednisone therapy. Patient has completed course. She reports blood glucose has been 70 to 189. (Had lowest of 67 in August when she was wearing Continuous Glucose Monitor). Currently taking metformin 550m daily. She is trying to keep a food journal to see what foods are increasing her blood glucose. She has realized that cereal can increase blood glucose. She asks for more guidance on which foods to choose and amounts.  Hypertension: blood pressure has been at goal recently. Currently taking hydrochlorothiazide, losartan, metoprolol succinate. Denies dizziness.  OAB: restarted oxybutynin ER 189mrecently. Patient reports improved bladder control.   Recent Visits:  08/09/2022 - Fam Med (Dr LoCarollee Herterseen for annual exam. Labs checked. Presribed magic mouthwash 4 times a day for mouth / tongue pain. 07/30/2022 - Nutritionist (FTyrone NineRD) Follow up DM. Continue to check BG at home. Recommended 3 meals per day + snacks. Recommended [email or web address] recipes. F/U as needed.  07/23/2022 - Fam Med (Dr WeNani RavensSeen for polyuria. Added oxybutynin 1053mR daily.    Objective: Review of patient status, including review of consultants reports, laboratory and other test data, was performed as part of comprehensive evaluation and provision of chronic care management services.   Lab Results  Component Value Date   CREATININE 0.74 08/09/2022   CREATININE 0.78 01/01/2022   CREATININE 0.80 08/30/2021    Lab Results  Component Value Date   HGBA1C 7.5 (H) 08/09/2022        Component Value Date/Time   CHOL 146 08/09/2022 1402   CHOL 206 (H) 07/22/2018 1057   TRIG 108 08/09/2022 1402   HDL 47 (L) 08/09/2022 1402   HDL 48 07/22/2018 1057   CHOLHDL 3.1 08/09/2022 1402   VLDL 37.0 01/01/2022 1752   LDLCALC 80 08/09/2022 1402   LDLDIRECT 126.3 06/06/2011 1533     Clinical ASCVD: Yes  The 10-year ASCVD risk score (Arnett DK, et al., 2019) is: 33.2%   Values used to calculate the score:     Age: [age]     Sex: Female     Is Non-Hispanic African American: Yes     Diabetic: Yes     Tobacco smoker: Yes     Systolic Blood Pressure: 130659Hg     Is BP treated: Yes     HDL Cholesterol: 47 mg/dL     Total Cholesterol: 146 mg/dL    BP Readings from Last 3 Encounters:  08/09/22 130/80  07/23/22 128/80  07/19/22 122/80     Allergies  Allergen Reactions   Penicillins    Codeine Rash and Other (See Comments)    dizziness    Medications Reviewed Today     Reviewed by LowAnn HeldO (Physician) on 08/09/22 at 1525  Med List Status: <None>   Medication Order Taking? Sig Documenting Provider Last Dose Status Informant  Accu-Chek Softclix Lancets lancets [id number] Use to check blood glucose once a day (DX: type 2 DM E11.65) LowCarollee HertervoAlferd ApaO Taking Active   azelastine (ASTELIN) 0.1 % nasal spray [id number] Place 2  sprays into both nostrils 2 (two) times daily. Use in each nostril as directed Ann Held, DO Taking Active   blood glucose meter kit and supplies KIT [id number] Yes Dispense based on patient and insurance preference. Use up to four times daily as directed. (FOR ICD-9 250.00, 250.01). Roma Schanz R, DO Taking Active   Blood Glucose Monitoring Suppl (ACCU-CHEK GUIDE) w/Device KIT [id number] Yes Use to check blood glucose daily (DX: type 2 DM E 11.65) Carollee Herter, Yvonne R, DO Taking Active   Continuous Blood Gluc Sensor (FREESTYLE LIBRE 2 SENSOR) [place] [id number] Yes Use to check blood glucose  continuously - change every 14 days. Roma Schanz R, DO Taking Active   fluconazole (DIFLUCAN) 150 MG tablet [id number] Yes Take 1 tab, repeat in 72 hours if no improvement. Shelda Pal, DO Taking Active   gabapentin (NEURONTIN) 600 MG tablet [id number] Yes Take 1 tablet (600 mg total) by mouth 3 (three) times daily. Roma Schanz R, DO  Active   glucose blood (ACCU-CHEK GUIDE) test strip [id number] Yes Use to check blood glucose once a day (Dx: type 2 DM / E11.65) Carollee Herter, Alferd Apa, DO Taking Active   hydrochlorothiazide (HYDRODIURIL) 25 MG tablet [id number] Yes Take 1 tablet (25 mg total) by mouth daily. Carollee Herter, Alferd Apa, DO  Active   levothyroxine (SYNTHROID) 50 MCG tablet [id number] Yes TAKE 1 TABLET BY MOUTH DAILY Carollee Herter, Alferd Apa, DO Taking Active   losartan (COZAAR) 100 MG tablet [id number] Yes TAKE 1 TABLET BY MOUTH ONCE  DAILY Ann Held, DO Taking Active   magic mouthwash w/lidocaine SOLN [id number] Yes Take 5 mLs by mouth 4 (four) times daily as needed for mouth pain. Swish and [address] [place], [place]  Active   metFORMIN (GLUCOPHAGE) 500 MG tablet [id number] Yes TAKE 1 TABLET BY MOUTH DAILY  WITH BREAKFAST Ann Held, DO Taking Active   metoprolol succinate (TOPROL-XL) 100 MG 24 hr tablet [id number] Yes Take 1 tablet (100 mg total) by mouth daily. Take with or immediately following a meal. Carollee Herter, Alferd Apa, DO  Active   montelukast (SINGULAIR) 10 MG tablet [id number] Yes Take 1 tablet (10 mg total) by mouth at bedtime. Carollee Herter, Kendrick Fries R, DO Taking Active   nicotine (NICODERM CQ - DOSED IN MG/24 HOURS) 14 mg/24hr patch [id number] Yes Place 1 patch (14 mg total) onto the skin daily. Remove and apply new patch each morning. Ann Held, [place]  Active   NON FORMULARY [id number] Yes [facility]  Scar Cream -  Verapamil 10%, Pentoxifylline 5% Apply 1-2 grams to affected area 3-4 times daily Qty. 120 gm 3 refills  [provider] Taking Active   oxybutynin (DITROPAN-XL) 10 MG 24 hr tablet [id number] Yes Take 1 tablet (10 mg total) by mouth at bedtime. Roma Schanz R, DO  Active    Discontinued 01/17/22 1530 (Reorder)   PARoxetine (PAXIL) 40 MG tablet [id number] Yes Take 1 tablet (40 mg total) by mouth every morning. Ann Held, DO  Active   pravastatin (PRAVACHOL) 40 MG tablet [id number] Yes Take 1 tablet (40 mg total) by mouth daily. For lowering cholesterol and heart health Ann Held, DO Taking Active             Patient Active Problem List   Diagnosis Date Noted   Type 2 diabetes mellitus with hyperglycemia, without long-term current use of insulin ([place]) 08/09/2022  Snapping lateral band due to ligament laxity 02/19/2022   Moderate episode of recurrent major depressive disorder ([place]) 01/01/2022   Polyuria 01/01/2022   Mixed stress and urge urinary incontinence 01/01/2022   Wound infection 10/04/2021   Wound of right ankle 09/27/2021   Trigger thumb, right thumb 09/13/2021   Pruritic dermatitis 05/25/2021   Vaginal discharge 05/25/2021   Balance disorder 05/25/2021   Dizziness 05/25/2021   Osteoarthritis    Vitamin D deficiency    Urinary frequency 10/31/2020   Chronic pain of both shoulders 10/31/2020   Female pattern baldness 10/06/2020   Myalgia 10/06/2020   Urge incontinence of urine 10/06/2020   History of diabetes mellitus, type II 01/11/2020   Allergies 02/03/2019   Acute vaginitis 04/22/2018   Morbid obesity 04/22/2018   Generalized anxiety disorder 04/22/2018   Obstructive sleep apnea 01/30/2018   Major depressive disorder 01/30/2018   Pansinusitis 01/30/2018   Aortic atherosclerosis 10/10/2017   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   Hyperlipidemia 04/17/2017   Anemia, iron deficiency 04/17/2017   Atypical chest pain 08/31/2014   Arm weakness 08/31/2014   Hearing loss 07/24/2012   Hypothyroidism 11/02/2010   Essential hypertension  08/31/2010   Hyperglycemia, fasting 63/84/5364   Diastolic dysfunction 68/12/2120   Back pain with radiculopathy 01/25/2010   Fatigue 01/25/2010   Tobacco abuse 12/01/2008   Restless legs syndrome 12/01/2008   Leg pain, bilateral 11/17/2008   Palpitations 02/12/2008   GERD (gastroesophageal reflux disease) 07/16/2007   Hiatal hernia 07/16/2007   Knee pain 07/16/2007   Low back pain 07/16/2007   Peripheral vertigo 04/30/2007   Insomnia 04/30/2007   Abdominal pain, right upper quadrant 04/30/2007     Medication Assistance:   patient is duel eligible - [place] Medicare + Medicaid   Assessment / Plan: Type 2 DM - recommended increasing metformin ER 52m to 2 tablets daily.  Recommended check B12 level with next labs.  Discussed her foods likes and dislikes. Identified a few foods for her to add to a regular rotation that won't increase her blood glucose too much and might be easy for her to keep on hand and take to work.   Broccoli - has eat raw or steam in microwave at work. She can get frozen if fresh broccoli is too expensive.   Kale / greens - again can have a kale salad or prepare sautees kale to take to work and heat up.   Chicken - fresh, frozen. She can also use packaged/ canned chicken from time to time but should watch sodium content of canned foods.   Two Good yogurt is a low carbohydrate / sugar option  Limit breaded / fried foods Hypertension: controlled Continue current medication for blood pressure - hydrochlorothiazide, losartan, metoprolol succinate.  OAB: improved Continue oxybutynin ER 176mdaily  Follow Up:  Telephone follow up appointment with care management team member scheduled for:  1 to 2 months   TaCherre RobinsPharmD Clinical Pharmacist [phone]

## 2022-08-23 ENCOUNTER — Other Ambulatory Visit: Payer: Self-pay | Admitting: Family Medicine

## 2022-08-23 DIAGNOSIS — I10 Essential (primary) hypertension: Secondary | ICD-10-CM

## 2022-08-23 DIAGNOSIS — E1169 Type 2 diabetes mellitus with other specified complication: Secondary | ICD-10-CM

## 2022-08-23 DIAGNOSIS — E1165 Type 2 diabetes mellitus with hyperglycemia: Secondary | ICD-10-CM

## 2022-08-23 NOTE — Patient Instructions (Signed)
Broccoli - has eat raw or steam in microwave at work. She can get frozen if fresh broccoli is too expensive.   Kale / greens - again can have a kale salad or prepare sautees kale to take to work and heat up.   Chicken - fresh, frozen. She can also use packaged/ canned chicken from time to time but should watch sodium content of canned foods.               Two Good yogurt is a low carbohydrate / sugar option              Limit breaded / fried foods

## 2022-08-26 ENCOUNTER — Ambulatory Visit: Payer: Self-pay | Admitting: Licensed Clinical Social Worker

## 2022-08-26 NOTE — Patient Outreach (Signed)
  Care Coordination   08/26/2022 Name: Debra [name] MRN: [id number] DOB: [birth year]   Care Coordination Outreach Attempts:  An unsuccessful telephone outreach was attempted today to offer the patient information about available care coordination services as a benefit of their health plan.   Follow Up Plan:  Additional outreach attempts will be made to offer the patient care coordination information and services.   Encounter Outcome:  No Answer  Care Coordination Interventions Activated:  No   Care Coordination Interventions:  No, not indicated    Norva Riffle.Gjon Letarte MSW, [place] Holiday representative [facility] Care Management [phone]

## 2022-08-26 NOTE — Patient Instructions (Signed)
Visit Information  Thank you for taking time to visit with me today. Please don't hesitate to contact me if I can be of assistance to you before our next scheduled telephone appointment.  Following are the goals we discussed today:   Our next appointment is by telephone on 09/09/22 at 1:30 PM   Please call the care guide team at [phone] if you need to cancel or reschedule your appointment.   If you are experiencing a Mental Health or [place] or need someone to talk to, please go to [facility] [place] [phone])   Following is a copy of your full plan of care:   Interventions:  LCSW called client phone number 2 times today but was not able to talk via phone with client. Answering machine not set up so LCSW was not able to leave client a message today  Ms. Meroney was given information about Care Management services by the embedded care coordination team including:  Care Management services include personalized support from designated clinical staff supervised by her physician, including individualized plan of care and coordination with other care providers 24/7 contact phone numbers for assistance for urgent and routine care needs. The patient may stop CCM services at any time (effective at the end of the month) by phone call to the office staff.  Patient agreed to services and verbal consent obtained.   Norva Riffle.Elliott Lasecki MSW, [place] Holiday representative [facility] Care Management [phone]

## 2022-08-27 ENCOUNTER — Encounter: Payer: Medicare Other | Admitting: Licensed Clinical Social Worker

## 2022-08-27 ENCOUNTER — Other Ambulatory Visit: Payer: Self-pay | Admitting: Family Medicine

## 2022-08-27 DIAGNOSIS — E785 Hyperlipidemia, unspecified: Secondary | ICD-10-CM

## 2022-08-27 DIAGNOSIS — I10 Essential (primary) hypertension: Secondary | ICD-10-CM

## 2022-08-27 DIAGNOSIS — E1165 Type 2 diabetes mellitus with hyperglycemia: Secondary | ICD-10-CM

## 2022-08-28 DIAGNOSIS — H268 Other specified cataract: Secondary | ICD-10-CM | POA: Diagnosis not present

## 2022-08-28 DIAGNOSIS — H25812 Combined forms of age-related cataract, left eye: Secondary | ICD-10-CM | POA: Diagnosis not present

## 2022-09-02 ENCOUNTER — Ambulatory Visit: Payer: Self-pay

## 2022-09-02 NOTE — Patient Outreach (Signed)
  Care Coordination   Follow Up Visit Note   09/02/2022 Name: Debra [name] MRN: [id number] DOB: [birth year]  Debra [name] is a 66 y.o. year old female who sees Carollee Herter, Alferd Apa, DO for primary care. I spoke with  Debra [name] by phone today.  What matters to the patients health and wellness today?  Patient reports having cataract removed left eye on 08/28/22. She reports follow up appointment scheduled for tomorrow. She is currently working and expresses her work schedule alternates between first, second and third. She states it is hard to keep track of things. She states she checked blood sugar not checked today or yesterday morning. She reports checked a couple nights ago and blood sugar was 124.   Goals Addressed             This Visit's Progress    Care coordination: Diabetes management       Care Coordination Interventions: Encouraged patient to attend follow up appointment with eye doctor as scheduled tomorrow Encouraged patient to schedule appointment with nutritionist per her schedule Reiterated blood sugar target goals with patient: 80-130 before meals and <180 about 2 hour after a meal Discussed Holiday eating and encouraged patient to monitor portion sizes, avoid concentrated sweets Encouraged patient to keep a memo pad and/or use notes in her phone to keep track of things to do Encouraged to check blood sugar daily per provider recommendation      SDOH assessments and interventions completed:  No  Care Coordination Interventions Activated:  Yes  Care Coordination Interventions:  Yes, provided   Follow up plan: Follow up call scheduled for 10/03/22    Encounter Outcome:  Pt. Visit Completed   Thea Silversmith, RN, MSN, BSN, [place] Coordinator [phone]

## 2022-09-02 NOTE — Patient Instructions (Addendum)
Visit Information  Thank you for taking time to visit with me today. Please don't hesitate to contact me if I can be of assistance to you.   Following are the goals we discussed today:   Goals Addressed             This Visit's Progress    Care coordination: Diabetes management       Care Coordination Interventions: Encouraged patient to attend follow up appointment with eye doctor as scheduled tomorrow Encouraged patient to schedule appointment with nutritionist per her schedule Reiterated blood sugar target goals with patient: 80-130 before meals and <180 about 2 hour after a meal Discussed Holiday eating and encouraged patient to monitor portion sizes, avoid concentrated sweets Encouraged patient to keep a memo pad and/or use notes in her phone to keep track of things to do Encouraged to check blood sugar daily per provider recommendation      Our next appointment is by telephone on 10/03/22 at 11:00 am  Please call the care guide team at [phone] if you need to cancel or reschedule your appointment.   If you are experiencing a Mental Health or [place] or need someone to talk to, please call the Suicide and Crisis Lifeline: 988  Patient verbalizes understanding of instructions and care plan provided today and agrees to view in [place]. Active MyChart status and patient understanding of how to access instructions and care plan via MyChart confirmed with patient.     Thea Silversmith, RN, MSN, BSN, [place] Coordinator [phone]

## 2022-09-03 ENCOUNTER — Telehealth: Payer: Self-pay

## 2022-09-03 NOTE — Patient Outreach (Signed)
  Care Coordination   Care Coordination  Visit Note   09/03/2022 Name: Debra [name] MRN: [id number] DOB: 08/04/1956  Debra [name] is a 66 y.o. year old female who sees Debra [name], Debra Apa, DO for primary care. No telephone contact was made with the patient during this encounter.  RNCM received message from care guide, Debra [name] regarding patient transitioning to [place] team. Next telephone outreach to be with CCM Care Management Team   Goals Addressed             This Visit's Progress    COMPLETED: Care coordination: Diabetes management       Care Coordination Interventions: Patient transitioned to [place] Coordinator      SDOH assessments and interventions completed:  No  Care Coordination Interventions Activated:  Yes  Care Coordination Interventions:  Yes, provided   Follow up plan: No further intervention required.   Encounter Outcome:  Pt. Visit Completed   Debra Silversmith, RN, MSN, BSN, [place] Coordinator [phone]

## 2022-09-05 ENCOUNTER — Other Ambulatory Visit: Payer: Self-pay | Admitting: Family Medicine

## 2022-09-05 DIAGNOSIS — E1165 Type 2 diabetes mellitus with hyperglycemia: Secondary | ICD-10-CM

## 2022-09-06 ENCOUNTER — Telehealth: Payer: Self-pay

## 2022-09-06 NOTE — Telephone Encounter (Unsigned)
   CCM RN Visit Note   09/06/22 Name: Debra [name] MRN: [id number]      DOB: 1956/10/24  Subjective: Debra [name] is a 66 y.o. year old female who is a primary care patient of Ann Held, DO. The patient was referred to the Chronic Care Management team for assistance with care management needs subsequent to provider initiation of CCM services and plan of care.      An unsuccessful telephone outreach was attempted today to contact the patient about Chronic Care Management needs.    PLAN Unable to leave a voice message.  A member of the care management team will attempt to reach [name] within the next two weeks.  Horris Latino RN Care Manager/Chronic Care Management [phone]

## 2022-09-09 ENCOUNTER — Ambulatory Visit: Payer: Self-pay | Admitting: Licensed Clinical Social Worker

## 2022-09-09 NOTE — Patient Instructions (Signed)
Visit Information  Thank you for taking time to visit with me today. Please don't hesitate to contact me if I can be of assistance to you before our next scheduled telephone appointment.  Following are the goals we discussed today:   Our next appointment is by telephone on 10/07/22 at 2:00 PM   Please call the care guide team at [phone] if you need to cancel or reschedule your appointment.   If you are experiencing a Mental Health or [place] or need someone to talk to, please go to [facility] [place] [phone])   Following is a copy of your full plan of care:   Interventions:  LCSW called client phone number 2 times today but was not able to talk via phone with client. Answering machine not set up so LCSW was not able to leave client a message today  Ms. Yager was given information about Care Management services by the embedded care coordination team including:  Care Management services include personalized support from designated clinical staff supervised by her physician, including individualized plan of care and coordination with other care providers 24/7 contact phone numbers for assistance for urgent and routine care needs. The patient may stop CCM services at any time (effective at the end of the month) by phone call to the office staff.  Patient agreed to services and verbal consent obtained.   Norva Riffle.Kashif Pooler MSW, [place] Holiday representative [facility] Care Management [phone]

## 2022-09-09 NOTE — Patient Outreach (Signed)
  Care Coordination   09/09/2022 Name: Debra [name] MRN: [id number] DOB: 11-Jan-1956   Care Coordination Outreach Attempts:  An unsuccessful telephone outreach was attempted today to offer the patient information about available care coordination services as a benefit of their health plan.   Follow Up Plan:  Additional outreach attempts will be made to offer the patient care coordination information and services.   Encounter Outcome:  No Answer  Care Coordination Interventions Activated:  No   Care Coordination Interventions:  No, not indicated    Norva Riffle.Lorilei Horan MSW, [place] Holiday representative [facility] Care Management [phone]

## 2022-09-24 ENCOUNTER — Telehealth: Payer: Medicare Other

## 2022-09-24 ENCOUNTER — Telehealth: Payer: Self-pay | Admitting: Pharmacist

## 2022-09-24 NOTE — Telephone Encounter (Signed)
  Care Management   Follow Up Note   09/24/2022 Name: Debra [name] MRN: [id number] DOB: 10-14-1956   Referred by: Ann Held, DO Reason for referral : No chief complaint on file.   An unsuccessful telephone outreach was attempted today. The patient was referred to the case management team for assistance with care management and care coordination.  Tried to call patient - unable to reach and VM has not been set up   Follow Up Plan: The care management team will reach out to the patient again over the next 30 days.   [name], PharmD Clinical Pharmacist [place] [facility]

## 2022-09-25 ENCOUNTER — Telehealth: Payer: Medicare Other

## 2022-09-25 NOTE — Telephone Encounter (Signed)
Patient was called into work today. Appointment rescheduled for tomorrow 09/25/2022 at 4pm.

## 2022-09-26 ENCOUNTER — Ambulatory Visit (INDEPENDENT_AMBULATORY_CARE_PROVIDER_SITE_OTHER): Payer: Medicare Other | Admitting: Pharmacist

## 2022-09-26 DIAGNOSIS — Z7984 Long term (current) use of oral hypoglycemic drugs: Secondary | ICD-10-CM

## 2022-09-26 DIAGNOSIS — E1169 Type 2 diabetes mellitus with other specified complication: Secondary | ICD-10-CM

## 2022-09-26 DIAGNOSIS — I10 Essential (primary) hypertension: Secondary | ICD-10-CM

## 2022-09-26 DIAGNOSIS — E785 Hyperlipidemia, unspecified: Secondary | ICD-10-CM | POA: Diagnosis not present

## 2022-09-26 DIAGNOSIS — E1159 Type 2 diabetes mellitus with other circulatory complications: Secondary | ICD-10-CM

## 2022-09-26 DIAGNOSIS — E1165 Type 2 diabetes mellitus with hyperglycemia: Secondary | ICD-10-CM

## 2022-09-26 MED ORDER — OZEMPIC (0.25 OR 0.5 MG/DOSE) 2 MG/3ML ~~LOC~~ SOPN: PEN_INJECTOR | SUBCUTANEOUS | 1 refills | Status: DC

## 2022-09-26 NOTE — Chronic Care Management (AMB) (Signed)
Chronic Care Management Pharmacy Note  07/03/2022 Name:  Debra [name] MRN:  [id number] DOB:  [birth year]  Summary: Medication Management: Reviewed refill history and discussed recent medication administration. Patient has been more adherent to medication regimen recently  Diabetes: Last A1c was 7.5% She reports she has difficulty with cravings. Recent blood glucose has been 125 to 175.  She is taking metformin 533m ER 2 tablets = 10052mdaily.  Will try adding Ozempic 0.2558meekly for 4 weeks, then 0.5mg14mekly thereafter. Patient will bring Ozempic to office for instruction after picks up from pharmacy.   Hyperlipidemia - LDL goal is < 70 Last LDL not at goal. Patient is taking pravastatin 40mg48mly more regularly now. Due to recheck lipids in 2 or 3 months. If LDL still > 70, then will either increase pravastatin to 80mg 63my or change to more potent statin like rosuvastatin 20mg o35morvastatin 40mg   25mective: Debra RSAYURI RHAMES6 y.o. 43ar old female who is a primary patient of Lowne ChAnn Heldhe CCM team was consulted for assistance with disease management and care coordination needs.    Engaged with patient by telephone for follow up visit in response to provider referral for pharmacy case management and/or care coordination services.   Consent to Services:  The patient was given information about Chronic Care Management services, agreed to services, and gave verbal consent prior to initiation of services.  Please see initial visit note for detailed documentation.   Patient Care Team: Lowne ChCarollee Herter RAlferd ApaPCP - General ChristopBuford DresserPCP - Cardiology (Cardiology) Botero, Leeroy ChaConsulting Physician (Neurosurgery) Sood, ViChesley MiresConsulting Physician (Pulmonary Disease) Sahaana Weitman, Cherre RobinsP (Pharmacist) Forrest,Katha Cabals [facility] [employer] (Licensed Clinical Social  Worker)   Objective:   Lab Results  Component Value Date   CREATININE 0.74 08/09/2022   CREATININE 0.78 01/01/2022   CREATININE 0.80 08/30/2021    Lab Results  Component Value Date   HGBA1C 7.5 (H) 08/09/2022   Last diabetic Eye exam:  Lab Results  Component Value Date/Time   HMDIABEYEEXA No Retinopathy 02/06/2022 12:00 AM    Last diabetic Foot exam: No results found for: "HMDIABFOOTEX"      Component Value Date/Time   CHOL 146 08/09/2022 1402   CHOL 206 (H) 07/22/2018 1057   TRIG 108 08/09/2022 1402   HDL 47 (L) 08/09/2022 1402   HDL 48 07/22/2018 1057   CHOLHDL 3.1 08/09/2022 1402   VLDL 37.0 01/01/2022 1752   LDLCALC 80 08/09/2022 1402   LDLDIRECT 126.3 06/06/2011 1533       Latest Ref Rng & Units 08/09/2022    2:02 PM 01/01/2022    5:52 PM 08/30/2021    4:00 PM  Hepatic Function  Total Protein 6.1 - 8.1 g/dL 7.4  7.5  8.3   Albumin 3.5 - 5.2 g/dL  4.0  4.3   AST 10 - 35 U/L _0 ALT 6 - 29 U/L _1 Alk Phosphatase 39 - 117 U/L  89  82   Total Bilirubin 0.2 - 1.2 mg/dL 0.5  0.4  0.5     Lab Results  Component Value Date/Time   TSH 0.67 08/09/2022 02:02 PM   TSH 1.32 01/01/2022 05:52 PM   FREET4 0.55 (L) 10/09/2020 02:44 PM   FREET4 0.82 07/22/2018 10:57 AM  Latest Ref Rng & Units 08/09/2022    2:02 PM 05/25/2021    2:33 PM 11/24/2020    2:31 PM  CBC  WBC 3.8 - 10.8 Thousand/uL 5.1  5.8  6.4   Hemoglobin 11.7 - 15.5 g/dL 10.9  11.6  11.1   Hematocrit 35.0 - 45.0 % 32.5  35.4  33.5   Platelets 140 - 400 Thousand/uL 254  260  239     Lab Results  Component Value Date/Time   VD25OH 49 08/09/2022 02:02 PM   VD25OH 33.57 02/27/2021 02:18 PM   VD25OH 36.68 10/05/2020 03:54 PM    Clinical ASCVD: No  The 10-year ASCVD risk score (Arnett DK, et al., 2019) is: 33.2%   Values used to calculate the score:     Age: 66 years     Sex: Female     Is Non-Hispanic African American: Yes     Diabetic: Yes     Tobacco smoker: Yes      Systolic Blood Pressure: 419 mmHg     Is BP treated: Yes     HDL Cholesterol: 47 mg/dL     Total Cholesterol: 146 mg/dL      Social History   Tobacco Use  Smoking Status Every Day   Packs/day: 0.75   Years: 44.00   Total pack years: 33.00   Types: Cigarettes  Smokeless Tobacco Never   BP Readings from Last 3 Encounters:  08/09/22 130/80  07/23/22 128/80  07/19/22 122/80   Pulse Readings from Last 3 Encounters:  08/09/22 70  07/23/22 68  07/19/22 74   Wt Readings from Last 3 Encounters:  08/09/22 168 lb 9.6 oz (76.5 kg)  07/23/22 170 lb (77.1 kg)  07/19/22 165 lb (74.8 kg)    Assessment: Review of patient past medical history, allergies, medications, health status, including review of consultants reports, laboratory and other test data, was performed as part of comprehensive evaluation and provision of chronic care management services.   SDOH:  (Social Determinants of Health) assessments and interventions performed:  SDOH Interventions    [place] Coordination from 08/19/2022 in [place] Coordination from 08/13/2022 in [place] Coordination from 07/02/2022 in [place] from 05/21/2022 in [place] Management from 04/17/2022 in [place] at [place] from 12/28/2021 in [place] at [place] Interventions        Food Insecurity Interventions -- -- Intervention Not Indicated -- -- Intervention Not Indicated  Housing Interventions -- -- Intervention Not Indicated -- -- Intervention Not Indicated  Transportation Interventions -- -- Intervention Not Indicated -- -- Intervention Not Indicated  Utilities Interventions -- -- Intervention Not Indicated -- -- --  Depression  Interventions/Treatment  Counseling Medication, Counseling -- Counseling, Medication Currently on Treatment --  Financial Strain Interventions -- -- Other (Comment)  [care guide referral] -- -- Intervention Not Indicated  Physical Activity Interventions -- -- -- -- Other (Comments)  [recommend increase frequency of exercise] Intervention Not Indicated  Stress Interventions Provide Counseling  [client has stress about managing diet and managing medical needs] -- -- -- -- Intervention Not Indicated  Social Connections Interventions -- -- -- -- -- Intervention Not Indicated        CCM Care Plan  Allergies  Allergen Reactions   Penicillins    Codeine Rash and Other (See  Comments)    dizziness    Medications Reviewed Today     Reviewed by [name], RPH-CPP (Pharmacist) on 09/26/22 at 1631  Med List Status: <None>   Medication Order Taking? Sig Documenting Provider Last Dose Status Informant  Accu-Chek Softclix Lancets lancets [id number] Yes Use to check blood glucose once a day (DX: type 2 DM E11.65) Carollee Herter, Alferd Apa, DO Taking Active   azelastine (ASTELIN) 0.1 % nasal spray [id number] Yes Place 2 sprays into both nostrils 2 (two) times daily. Use in each nostril as directed Ann Held, DO Taking Active   blood glucose meter kit and supplies KIT [id number] Yes Dispense based on patient and insurance preference. Use up to four times daily as directed. (FOR ICD-9 250.00, 250.01). Roma Schanz R, DO Taking Active   Blood Glucose Monitoring Suppl (ACCU-CHEK GUIDE) w/Device KIT [id number] Yes Use to check blood glucose daily (DX: type 2 DM E 11.65) Lowne Chase, Yvonne R, DO Taking Active   gabapentin (NEURONTIN) 600 MG tablet [id number] Yes Take 1 tablet (600 mg total) by mouth 3 (three) times daily. Roma Schanz R, DO Taking Active   glucose blood (ACCU-CHEK GUIDE) test strip [id number] Yes Use to check blood glucose once a day (Dx: type 2 DM / E11.65) Carollee Herter,  Alferd Apa, DO Taking Active   hydrochlorothiazide (HYDRODIURIL) 25 MG tablet [id number] Yes Take 1 tablet (25 mg total) by mouth daily. Carollee Herter, Kendrick Fries R, DO Taking Active   levothyroxine (SYNTHROID) 50 MCG tablet [id number] Yes TAKE 1 TABLET BY MOUTH DAILY Carollee Herter, Alferd Apa, DO Taking Active   losartan (COZAAR) 100 MG tablet [id number] Yes TAKE 1 TABLET BY MOUTH ONCE  DAILY Ann Held, DO Taking Active   magic mouthwash w/lidocaine SOLN [id number] Yes Take 5 mLs by mouth 4 (four) times daily as needed for mouth pain. Swish and [address] [place], DO Taking Active   metFORMIN (GLUCOPHAGE-XR) 500 MG 24 hr tablet [id number] Yes Take 2 tablets (1,000 mg total) by mouth daily. With largest meal. Carollee Herter, Alferd Apa, DO Taking Active   metoprolol succinate (TOPROL-XL) 100 MG 24 hr tablet [id number] Yes Take 1 tablet (100 mg total) by mouth daily. Take with or immediately following a meal. Carollee Herter, Alferd Apa, DO Taking Active   montelukast (SINGULAIR) 10 MG tablet [id number] Yes Take 1 tablet (10 mg total) by mouth at bedtime. Carollee Herter, Kendrick Fries R, DO Taking Active   nicotine (NICODERM CQ - DOSED IN MG/24 HOURS) 14 mg/24hr patch [id number] Yes Place 1 patch (14 mg total) onto the skin daily. Remove and apply new patch each morning. Carollee Herter, [place], DO Taking Active   NON FORMULARY [id number] Yes [facility]  Scar Cream -  Verapamil 10%, Pentoxifylline 5% Apply 1-2 grams to affected area 3-4 times daily Qty. 120 gm 3 refills [provider] Taking Active   oxybutynin (DITROPAN-XL) 10 MG 24 hr tablet [id number] Yes Take 1 tablet (10 mg total) by mouth at bedtime. Ann Held, DO Taking Active     Discontinued 01/17/22 1530 (Reorder)   PARoxetine (PAXIL) 40 MG tablet [id number] Yes Take 1 tablet (40 mg total) by mouth every morning. Ann Held, DO Taking Active   pravastatin (PRAVACHOL) 40 MG tablet [id number] Yes Take 1 tablet (40 mg total) by  mouth daily. For lowering cholesterol and heart health Carollee Herter, Alferd Apa, DO Taking Active  Patient Active Problem List   Diagnosis Date Noted   Type 2 diabetes mellitus with hyperglycemia, without long-term current use of insulin ([place]) 08/09/2022   Snapping lateral band due to ligament laxity 02/19/2022   Moderate episode of recurrent major depressive disorder (HCC) 01/01/2022   Polyuria 01/01/2022   Mixed stress and urge urinary incontinence 01/01/2022   Wound infection 10/04/2021   Wound of right ankle 09/27/2021   Trigger thumb, right thumb 09/13/2021   Pruritic dermatitis 05/25/2021   Vaginal discharge 05/25/2021   Balance disorder 05/25/2021   Dizziness 05/25/2021   Osteoarthritis    Vitamin D deficiency    Urinary frequency 10/31/2020   Chronic pain of both shoulders 10/31/2020   Female pattern baldness 10/06/2020   Myalgia 10/06/2020   Urge incontinence of urine 10/06/2020   History of diabetes mellitus, type II 01/11/2020   Allergies 02/03/2019   Acute vaginitis 04/22/2018   Morbid obesity 04/22/2018   Generalized anxiety disorder 04/22/2018   Obstructive sleep apnea 01/30/2018   Major depressive disorder 01/30/2018   Pansinusitis 01/30/2018   Aortic atherosclerosis 10/10/2017   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   Hyperlipidemia 04/17/2017   Anemia, iron deficiency 04/17/2017   Atypical chest pain 08/31/2014   Arm weakness 08/31/2014   Hearing loss 07/24/2012   Hypothyroidism 11/02/2010   Essential hypertension 08/31/2010   Hyperglycemia, fasting 70/14/1030   Diastolic dysfunction 13/14/3888   Back pain with radiculopathy 01/25/2010   Fatigue 01/25/2010   Tobacco abuse 12/01/2008   Restless legs syndrome 12/01/2008   Leg pain, bilateral 11/17/2008   Palpitations 02/12/2008   GERD (gastroesophageal reflux disease) 07/16/2007   Hiatal hernia 07/16/2007   Knee pain 07/16/2007   Low back pain 07/16/2007   Peripheral vertigo 04/30/2007    Insomnia 04/30/2007   Abdominal pain, right upper quadrant 04/30/2007    Immunization History  Administered Date(s) Administered   Fluad Quad(high Dose 65+) 08/30/2021   Influenza Whole 10/01/2007, 08/31/2010   Influenza,inj,Quad PF,6+ Mos 10/12/2013, 08/29/2015, 08/13/2016, 08/12/2017, 08/03/2018   Influenza-Unspecified 08/10/2019   PFIZER Comirnaty(Gray Top)Covid-19 Tri-Sucrose Vaccine 02/27/2021   PFIZER(Purple Top)SARS-COV-2 Vaccination 01/11/2020, 01/31/2020, 08/24/2020   Pfizer Covid-19 Vaccine Bivalent Booster 88yr & up 08/29/2021   Pneumococcal Polysaccharide-23 02/27/2021   Td 11/02/2010, 09/27/2021   Tdap 08/03/2018   Zoster Recombinat (Shingrix) 04/17/2017, 06/17/2017   Zoster, Live 03/12/2016    Conditions to be addressed/monitored: HTN, HLD, DMII, Anxiety, Depression, GERD, Tobacco Use, and stress / urge incontinence   Goals       Patient Stated     Patient is concerned about job issues; she is concerned about transport issues. (pt-stated)      Care Coordination Interventions:  Active listening / Reflection utilized  Emotional Support Provided  Provided counseling support Spoke of job concerns of client. She is currently working at a job. Looking for a job where she could earn more money Client spoke of transportation needs Client spoke of concerns with managing Diabetes and managing blood sugar levels. She is currently having BS test for 14 days with CGM use.  Discussed pain issues of client. Client said she smokes currently. Reviewed stress issues of client Client would like to have RN call her to discuss food procurement of client and how to buy healthy foods for client      patient said she has trouble managing her job demands and managing diabetes (pt-stated)      Interventions:  LCSW called client phone number 2 times today but was not able to talk via phone  with client. Answering machine not set up so LCSW was not able to leave client a message today       Other     [place] for Chronic Care Management       Diabetes: Not at goal; A1c goal < 7.0% Lab Results  Component Value Date   HGBA1C 7.5 (H) 08/09/2022  Take medications as prescribed Understand how medications and blood glucose level is related Work with Clinical Pharmacist Practitioner and provider to determine optimal medication therapy and timing of of medication therapy to best control blood glucose.  Understand blood glucose goals and how self monitoring of blood glucose can improve diabetes control Prevent diabetes related complications  Hypertension: Controlled; blood pressure goal <140/90  BP Readings from Last 3 Encounters:  08/09/22 130/80  07/23/22 128/80  07/19/22 122/80   Understand the role of medication therapy in reaching blood pressure goals and prevention of kidney and heart disease   Hyperlipidemia: Not at goals; LDL goal is < 70 and triglycerides < 150  Lab Results  Component Value Date   CHOL 146 08/09/2022   HDL 47 (L) 08/09/2022   LDLCALC 80 08/09/2022   LDLDIRECT 126.3 06/06/2011   TRIG 108 08/09/2022   CHOLHDL 3.1 08/09/2022  Understand the benefits of statin therapy in lowering cholesterol and preventing cardiovascular / heart disease.    Medication management  work with PharmD and providers to maintain optimal medication adherence Take medications as prescribed by providers Maintain communication with providers and pharmacist regarding suspected adverse effects from medications.             Medication Assistance: None required.  Patient affirms current coverage meets needs.  Patient's preferred pharmacy is:  [id number] - [place], [place] - [address] [place] [address] Phone: [phone] Fax: [phone]  [place] [id number] [place], [place] - [address] [place] [address] Phone: [phone] Fax:  [phone]  [facility] ([place]) - [place], [place] [facility] [address] [place] [address] Phone: [phone] Fax: [place], [place] [address] [place] [address] Phone: [phone] Fax: [phone]   Follow Up:  Patient agrees to Care Plan and Follow-up.    [name], PharmD Clinical Pharmacist [facility]

## 2022-09-26 NOTE — Patient Instructions (Signed)
Mrs. Laprise It was a pleasure speaking with you today.  Below is a summary of your health goals and summary of our recent visit. You can also view your updated Chronic Care Management Care plan through your MyChart account.   We are going to try Ozempic 0.'25mg'$  weekly. I have sent a prescription to your pharmacy. Please call me when you pick it up and we will set up an appointment for me to show you how to use it. Lynelle Smoke Antony Contras [phone])   Goals       Patient Stated     Patient is concerned about job issues; she is concerned about transport issues. (pt-stated)      Care Coordination Interventions:  Active listening / Reflection utilized  Emotional Support Provided  Provided counseling support Spoke of job concerns of client. She is currently working at a job. Looking for a job where she could earn more money Client spoke of transportation needs Client spoke of concerns with managing Diabetes and managing blood sugar levels. She is currently having BS test for 14 days with CGM use.  Discussed pain issues of client. Client said she smokes currently. Reviewed stress issues of client Client would like to have RN call her to discuss food procurement of client and how to buy healthy foods for client      patient said she has trouble managing her job demands and managing diabetes (pt-stated)      Interventions:  LCSW called client phone number 2 times today but was not able to talk via phone with client. Answering machine not set up so LCSW was not able to leave client a message today      Other     [place] for Chronic Care Management       Diabetes: Not at goal; A1c goal < 7.0% Lab Results  Component Value Date   HGBA1C 7.5 (H) 08/09/2022  Take medications as prescribed Understand how medications and blood glucose level is related Work with Clinical Pharmacist Practitioner and provider to determine optimal medication therapy and timing of of medication therapy to best  control blood glucose.  Understand blood glucose goals and how self monitoring of blood glucose can improve diabetes control Prevent diabetes related complications  Hypertension: Controlled; blood pressure goal <140/90  BP Readings from Last 3 Encounters:  08/09/22 130/80  07/23/22 128/80  07/19/22 122/80   Understand the role of medication therapy in reaching blood pressure goals and prevention of kidney and heart disease   Hyperlipidemia: Not at goals; LDL goal is < 70 and triglycerides < 150  Lab Results  Component Value Date   CHOL 146 08/09/2022   HDL 47 (L) 08/09/2022   LDLCALC 80 08/09/2022   LDLDIRECT 126.3 06/06/2011   TRIG 108 08/09/2022   CHOLHDL 3.1 08/09/2022  Understand the benefits of statin therapy in lowering cholesterol and preventing cardiovascular / heart disease.    Medication management  work with PharmD and providers to maintain optimal medication adherence Take medications as prescribed by providers Maintain communication with providers and pharmacist regarding suspected adverse effects from medications.            As always if you have any questions or concerns especially regarding medications, please feel free to contact me either at the phone number below or with a MyChart message.     [name], PharmD Clinical Pharmacist [facility] [phone] (direct line)  [phone] (main office number)   Patient verbalizes understanding  of instructions and care plan provided today and agrees to view in [place]. Active MyChart status and patient understanding of how to access instructions and care plan via MyChart confirmed with patient.

## 2022-10-03 ENCOUNTER — Telehealth: Payer: Self-pay

## 2022-10-03 ENCOUNTER — Ambulatory Visit (INDEPENDENT_AMBULATORY_CARE_PROVIDER_SITE_OTHER): Payer: Medicare Other | Admitting: Pharmacist

## 2022-10-03 ENCOUNTER — Telehealth: Payer: Medicare Other

## 2022-10-03 DIAGNOSIS — I10 Essential (primary) hypertension: Secondary | ICD-10-CM

## 2022-10-03 DIAGNOSIS — E1165 Type 2 diabetes mellitus with hyperglycemia: Secondary | ICD-10-CM

## 2022-10-03 NOTE — Telephone Encounter (Signed)
   CCM RN Visit Note   10/03/22 Name: Debra [name] MRN: [id number]      DOB: Apr 11, 1956  Subjective: Debra [name] is a 66 y.o. year old female who is a primary care patient of Ann Held, DO. The patient was referred to the Chronic Care Management team for assistance with care management needs subsequent to provider initiation of CCM services and plan of care.      An unsuccessful outreach attempt was made today for a scheduled CCM visit.    PLAN Unable to leave a voice message due to patient's voice mailbox not being set up. Additional outreach attempts will be made.   Horris Latino RN Care Manager/Chronic Care Management [phone]

## 2022-10-03 NOTE — Progress Notes (Signed)
Pharmacy Note  10/03/2022 Name: Debra [name] MRN: [id number] DOB: 02/28/1956  Subjective: Debra [name] is a 66 y.o. year old female who is a primary care patient of Carollee Herter, Alferd Apa, DO. Clinical Pharmacist Practitioner referral was placed to assist with medication and diabetes management.    Patient is here today to get instruction on Ozmepic injection.  Engaged with patient face to face for follow up visit today.  Type 2 DM - A1c has increased. Currently taking metformin 569m daily. She is trying to keep a food journal to see what foods are increasing her blood glucose. Reports she is having difficulty with appetite. Eats a lot late at night. Hypertension: blood pressure has been at goal recently. Currently taking hydrochlorothiazide, losartan, metoprolol succinate. Denies dizziness.  Patient brings in her home blood pressure cuff to check accuracey compared to office blood pressure cuff.  Her cuff reading was 155/87 used manual cuff in office and was 132/78. Used automated office cuff and was 122/70.    Objective: Review of patient status, including review of consultants reports, laboratory and other test data, was performed as part of comprehensive evaluation and provision of chronic care management services.   Lab Results  Component Value Date   CREATININE 0.74 08/09/2022   CREATININE 0.78 01/01/2022   CREATININE 0.80 08/30/2021    Lab Results  Component Value Date   HGBA1C 7.5 (H) 08/09/2022       Component Value Date/Time   CHOL 146 08/09/2022 1402   CHOL 206 (H) 07/22/2018 1057   TRIG 108 08/09/2022 1402   HDL 47 (L) 08/09/2022 1402   HDL 48 07/22/2018 1057   CHOLHDL 3.1 08/09/2022 1402   VLDL 37.0 01/01/2022 1752   LDLCALC 80 08/09/2022 1402   LDLDIRECT 126.3 06/06/2011 1533     Clinical ASCVD: Yes  The 10-year ASCVD risk score (Arnett DK, et al., 2019) is: 33.2%   Values used to calculate the score:     Age: [age]years     Sex: Female     Is  Non-Hispanic African American: Yes     Diabetic: Yes     Tobacco smoker: Yes     Systolic Blood Pressure: 1458mmHg     Is BP treated: Yes     HDL Cholesterol: 47 mg/dL     Total Cholesterol: 146 mg/dL    BP Readings from Last 3 Encounters:  08/09/22 130/80  07/23/22 128/80  07/19/22 122/80     Allergies  Allergen Reactions   Penicillins    Codeine Rash and Other (See Comments)    dizziness    Medications Reviewed Today     Reviewed by ECherre Robins RPH-CPP (Pharmacist) on 09/26/22 at 1[place] Status: <None>   Medication Order Taking? Sig Documenting Provider Last Dose Status Informant  Accu-Chek Softclix Lancets lancets [id number] Use to check blood glucose once a day (DX: type 2 DM E11.65) LCarollee Herter YAlferd Apa DO Taking Active   azelastine (ASTELIN) 0.1 % nasal spray [id number] Place 2 sprays into both nostrils 2 (two) times daily. Use in each nostril as directed LAnn Held DO Taking Active   blood glucose meter kit and supplies KIT [id number] Dispense based on patient and insurance preference. Use up to four times daily as directed. (FOR ICD-9 250.00, 250.01). LRoma SchanzR, DO Taking Active   Blood Glucose Monitoring Suppl (ACCU-CHEK GUIDE) w/Device KIT [id number] Use to check blood glucose daily (  DX: type 2 DM E 11.65) Ann Held, DO Taking Active   gabapentin (NEURONTIN) 600 MG tablet [id number] Yes Take 1 tablet (600 mg total) by mouth 3 (three) times daily. Roma Schanz R, DO Taking Active   glucose blood (ACCU-CHEK GUIDE) test strip [id number] Yes Use to check blood glucose once a day (Dx: type 2 DM / E11.65) Carollee Herter, Alferd Apa, DO Taking Active   hydrochlorothiazide (HYDRODIURIL) 25 MG tablet [id number] Yes Take 1 tablet (25 mg total) by mouth daily. Carollee Herter, Kendrick Fries R, DO Taking Active   levothyroxine (SYNTHROID) 50 MCG tablet [id number] Yes TAKE 1 TABLET BY MOUTH DAILY Carollee Herter, Alferd Apa, DO Taking Active    losartan (COZAAR) 100 MG tablet [id number] Yes TAKE 1 TABLET BY MOUTH ONCE  DAILY Ann Held, DO Taking Active   magic mouthwash w/lidocaine SOLN [id number] Yes Take 5 mLs by mouth 4 (four) times daily as needed for mouth pain. Swish and [address] [place], DO Taking Active   metFORMIN (GLUCOPHAGE-XR) 500 MG 24 hr tablet [id number] Yes Take 2 tablets (1,000 mg total) by mouth daily. With largest meal. Carollee Herter, Alferd Apa, DO Taking Active   metoprolol succinate (TOPROL-XL) 100 MG 24 hr tablet [id number] Yes Take 1 tablet (100 mg total) by mouth daily. Take with or immediately following a meal. Carollee Herter, Alferd Apa, DO Taking Active   montelukast (SINGULAIR) 10 MG tablet [id number] Yes Take 1 tablet (10 mg total) by mouth at bedtime. Carollee Herter, Kendrick Fries R, DO Taking Active   nicotine (NICODERM CQ - DOSED IN MG/24 HOURS) 14 mg/24hr patch [id number] Yes Place 1 patch (14 mg total) onto the skin daily. Remove and apply new patch each morning. Carollee Herter, [place], DO Taking Active   NON FORMULARY [id number] Yes [facility]  Scar Cream -  Verapamil 10%, Pentoxifylline 5% Apply 1-2 grams to affected area 3-4 times daily Qty. 120 gm 3 refills [provider] Taking Active   oxybutynin (DITROPAN-XL) 10 MG 24 hr tablet [id number] Yes Take 1 tablet (10 mg total) by mouth at bedtime. Ann Held, DO Taking Active     Discontinued 01/17/22 1530 (Reorder)   PARoxetine (PAXIL) 40 MG tablet [id number] Yes Take 1 tablet (40 mg total) by mouth every morning. Ann Held, DO Taking Active   pravastatin (PRAVACHOL) 40 MG tablet [id number] Yes Take 1 tablet (40 mg total) by mouth daily. For lowering cholesterol and heart health Ann Held, DO Taking Active             Patient Active Problem List   Diagnosis Date Noted   Type 2 diabetes mellitus with hyperglycemia, without long-term current use of insulin ([place]) 08/09/2022   Snapping lateral band due  to ligament laxity 02/19/2022   Moderate episode of recurrent major depressive disorder ([place]) 01/01/2022   Polyuria 01/01/2022   Mixed stress and urge urinary incontinence 01/01/2022   Wound infection 10/04/2021   Wound of right ankle 09/27/2021   Trigger thumb, right thumb 09/13/2021   Pruritic dermatitis 05/25/2021   Vaginal discharge 05/25/2021   Balance disorder 05/25/2021   Dizziness 05/25/2021   Osteoarthritis    Vitamin D deficiency    Urinary frequency 10/31/2020   Chronic pain of both shoulders 10/31/2020   Female pattern baldness 10/06/2020   Myalgia 10/06/2020   Urge incontinence of urine 10/06/2020   History of diabetes mellitus, type II 01/11/2020   Allergies 02/03/2019  Acute vaginitis 04/22/2018   Morbid obesity 04/22/2018   Generalized anxiety disorder 04/22/2018   Obstructive sleep apnea 01/30/2018   Major depressive disorder 01/30/2018   Pansinusitis 01/30/2018   Aortic atherosclerosis 10/10/2017   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   Hyperlipidemia 04/17/2017   Anemia, iron deficiency 04/17/2017   Atypical chest pain 08/31/2014   Arm weakness 08/31/2014   Hearing loss 07/24/2012   Hypothyroidism 11/02/2010   Essential hypertension 08/31/2010   Hyperglycemia, fasting 46/65/9935   Diastolic dysfunction 70/17/7939   Back pain with radiculopathy 01/25/2010   Fatigue 01/25/2010   Tobacco abuse 12/01/2008   Restless legs syndrome 12/01/2008   Leg pain, bilateral 11/17/2008   Palpitations 02/12/2008   GERD (gastroesophageal reflux disease) 07/16/2007   Hiatal hernia 07/16/2007   Knee pain 07/16/2007   Low back pain 07/16/2007   Peripheral vertigo 04/30/2007   Insomnia 04/30/2007   Abdominal pain, right upper quadrant 04/30/2007     Medication Assistance:   patient is duel eligible - [place] + Medicaid   Assessment / Plan: Type 2 DM  Started Ozmepic 0.29m weekly - first dose given in office today. Patient will take 0.227mweekly  for 4 doses and then increase to 0.11m1meekly.  Discussed proper injeciton technique and site selection. Discussed proper disposal of pen needle and how to store [place] Check B12 level with next labs.   Hypertension: controlled Continue current medication for blood pressure - hydrochlorothiazide, losartan, metoprolol succinate.  Her Home blood pressure cuff does not seem to be accurate - recommended replacing.    Follow Up:  Telephone follow up appointment with care management team member scheduled for:  1 to 2 weeks   TamCherre RobinsharmD Clinical Pharmacist [phone]

## 2022-10-07 ENCOUNTER — Encounter: Payer: Self-pay | Admitting: Licensed Clinical Social Worker

## 2022-10-08 ENCOUNTER — Ambulatory Visit: Payer: Self-pay | Admitting: Licensed Clinical Social Worker

## 2022-10-08 ENCOUNTER — Encounter: Payer: Self-pay | Admitting: Family Medicine

## 2022-10-08 ENCOUNTER — Ambulatory Visit (INDEPENDENT_AMBULATORY_CARE_PROVIDER_SITE_OTHER): Payer: Medicare Other | Admitting: Family Medicine

## 2022-10-08 VITALS — BP 132/84 | HR 79 | Temp 97.9°F | Resp 18 | Ht 63.0 in | Wt 170.4 lb

## 2022-10-08 DIAGNOSIS — F411 Generalized anxiety disorder: Secondary | ICD-10-CM

## 2022-10-08 DIAGNOSIS — R5383 Other fatigue: Secondary | ICD-10-CM | POA: Diagnosis not present

## 2022-10-08 DIAGNOSIS — E1169 Type 2 diabetes mellitus with other specified complication: Secondary | ICD-10-CM

## 2022-10-08 DIAGNOSIS — I10 Essential (primary) hypertension: Secondary | ICD-10-CM | POA: Diagnosis not present

## 2022-10-08 DIAGNOSIS — E1165 Type 2 diabetes mellitus with hyperglycemia: Secondary | ICD-10-CM | POA: Diagnosis not present

## 2022-10-08 DIAGNOSIS — T7840XD Allergy, unspecified, subsequent encounter: Secondary | ICD-10-CM | POA: Diagnosis not present

## 2022-10-08 DIAGNOSIS — E7800 Pure hypercholesterolemia, unspecified: Secondary | ICD-10-CM | POA: Diagnosis not present

## 2022-10-08 DIAGNOSIS — E785 Hyperlipidemia, unspecified: Secondary | ICD-10-CM

## 2022-10-08 LAB — CBC WITH DIFFERENTIAL/PLATELET
Basophils Absolute: 0 10*3/uL (ref 0.0–0.1)
Basophils Relative: 0.1 % (ref 0.0–3.0)
Eosinophils Absolute: 0.1 10*3/uL (ref 0.0–0.7)
Eosinophils Relative: 0.7 % (ref 0.0–5.0)
HCT: 35.9 % — ABNORMAL LOW (ref 36.0–46.0)
Hemoglobin: 12 g/dL (ref 12.0–15.0)
Lymphocytes Relative: 38.9 % (ref 12.0–46.0)
Lymphs Abs: 2.8 10*3/uL (ref 0.7–4.0)
MCHC: 33.6 g/dL (ref 30.0–36.0)
MCV: 85.2 fl (ref 78.0–100.0)
Monocytes Absolute: 0.7 10*3/uL (ref 0.1–1.0)
Monocytes Relative: 9.1 % (ref 3.0–12.0)
Neutro Abs: 3.7 10*3/uL (ref 1.4–7.7)
Neutrophils Relative %: 51.2 % (ref 43.0–77.0)
Platelets: 275 10*3/uL (ref 150.0–400.0)
RBC: 4.21 Mil/uL (ref 3.87–5.11)
RDW: 14.3 % (ref 11.5–15.5)
WBC: 7.3 10*3/uL (ref 4.0–10.5)

## 2022-10-08 LAB — COMPREHENSIVE METABOLIC PANEL
ALT: 13 U/L (ref 0–35)
AST: 19 U/L (ref 0–37)
Albumin: 4.1 g/dL (ref 3.5–5.2)
Alkaline Phosphatase: 95 U/L (ref 39–117)
BUN: 14 mg/dL (ref 6–23)
CO2: 28 mEq/L (ref 19–32)
Calcium: 9.9 mg/dL (ref 8.4–10.5)
Chloride: 103 mEq/L (ref 96–112)
Creatinine, Ser: 0.75 mg/dL (ref 0.40–1.20)
GFR: 82.51 mL/min (ref >60.00)
Glucose, Bld: 91 mg/dL (ref 70–99)
Potassium: 4.3 mEq/L (ref 3.5–5.1)
Sodium: 138 mEq/L (ref 135–145)
Total Bilirubin: 0.6 mg/dL (ref 0.2–1.2)
Total Protein: 7.8 g/dL (ref 6.0–8.3)

## 2022-10-08 LAB — LIPID PANEL
Cholesterol: 166 mg/dL (ref 0–200)
HDL: 53.2 mg/dL (ref >39.00)
LDL Cholesterol: 95 mg/dL (ref 0–99)
NonHDL: 112.79
Total CHOL/HDL Ratio: 3
Triglycerides: 87 mg/dL (ref 0.0–149.0)
VLDL: 17.4 mg/dL (ref 0.0–40.0)

## 2022-10-08 LAB — HEMOGLOBIN A1C: Hgb A1c MFr Bld: 7.1 % — ABNORMAL HIGH (ref 4.6–6.5)

## 2022-10-08 MED ORDER — BLOOD GLUCOSE MONITOR KIT: PACK | 0 refills | Status: DC

## 2022-10-08 MED ORDER — PAROXETINE HCL 40 MG PO TABS: 40.0000 mg | ORAL_TABLET | Freq: Every morning | ORAL | 3 refills | Status: DC

## 2022-10-08 MED ORDER — AZELASTINE HCL 0.1 % NA SOLN: 2.0000 | Freq: Two times a day (BID) | NASAL | 5 refills | Status: DC

## 2022-10-08 MED ORDER — METOPROLOL SUCCINATE ER 100 MG PO TB24: 100.0000 mg | ORAL_TABLET | Freq: Every day | ORAL | 1 refills | Status: DC

## 2022-10-08 NOTE — Patient Instructions (Signed)

## 2022-10-08 NOTE — Assessment & Plan Note (Signed)
hgba1c to be checked, minimize simple carbs. Increase exercise as tolerated. Continue current meds  

## 2022-10-08 NOTE — Patient Outreach (Signed)
  Care Coordination   10/08/2022 Name: Debra [name] MRN: [id number] DOB: 1956/02/10   Care Coordination Outreach Attempts:  An unsuccessful telephone outreach was attempted today to offer the patient information about available care coordination services as a benefit of their health plan.   Follow Up Plan:  Additional outreach attempts will be made to offer the patient care coordination information and services.   Encounter Outcome:  No Answer   Care Coordination Interventions:  No, not indicated    Norva Riffle.Dahl Higinbotham MSW, [place] [facility] representative [facility] Care Management [phone]

## 2022-10-08 NOTE — Assessment & Plan Note (Signed)
Well controlled, no changes to meds. Encouraged heart healthy diet such as the DASH diet and exercise as tolerated.  °

## 2022-10-08 NOTE — Progress Notes (Addendum)
Subjective:   By signing my name below, I, Debra [name], attest that this documentation has been prepared under the direction and in the presence of [name] 10/08/2022    Patient ID: Debra [name], female    DOB: 14-Apr-1956, 66 y.o.   MRN: [id number]  Chief Complaint  Patient presents with   Diabetes   Hypertension   Hyperlipidemia   Follow-up    HPI Patient is in today for an office visit   She is requesting a refill of 0.1 % of Astelin spray.   She reports that she is experiencing some fatigue and stress from her job. She recently moved departments in her job and due to the environment is experiencing stress. She states that as a result, she has not been eating well.   She states that she has started taking Ozempic. She has lost her glucose meter and cannot find it. Therefore, she has not been regularly checking her sugars. She is requesting to get her blood sugars checked in office.  Lab Results  Component Value Date   HGBA1C 7.1 (H) 10/08/2022   She states that she was supposed to take a pill that she has not taken. She reports of the shadow over her left eye is persistent and wonders if the pill was for that symptom.   She reports that she is UTD on the covid, RSV and influenza vaccines.   She is not UTD on mammograms. Her mammogram was last completed on 06/19/2021.   Past Medical History:  Diagnosis Date   Abdominal pain, right upper quadrant 04/30/2007   Allergies 02/03/2019   Allergy    Anemia, iron deficiency 04/17/2017   Aortic atherosclerosis 10/10/2017   Atypical chest pain 08/31/2014   Back pain with radiculopathy 01/25/2010   Check xray --- ? Cause of leg weakness   Chronic pain of both shoulders 10/31/2020   Constipation    DDD (degenerative disc disease)    Diabetes mellitus type II, uncontrolled 19/16/6060   Diastolic dysfunction 04/59/9774   Epilepsy    Childhood seziures; pt reports that she grew out of them and they have not  occurred since childhood   Essential hypertension 08/31/2010   Poorly controlled will alter medications, encouraged DASH diet, minimize caffeine and obtain adequate sleep. Report concerning symptoms and follow up as directed and as needed [employer] metoprolol Edema may be c   Fatigue 01/25/2010   Generalized anxiety disorder 04/22/2018   Stable co'nt meds   GERD (gastroesophageal reflux disease) 07/16/2007   Hearing loss 07/24/2012   Refer to hearing clinic   Hiatal hernia 07/16/2007   Hyperglycemia, fasting 05/04/2010   Hyperlipidemia 04/17/2017   Encouraged heart healthy diet, increase exercise, avoid trans fats, consider a krill oil cap daily   Hypothyroidism 11/02/2010   con't meds; Lab Results Component Value TSH 1.44 08/12/2017   Insomnia 04/30/2007   Knee pain 07/16/2007   Leg pain, bilateral 11/17/2008   Low back pain 07/16/2007   Major depressive disorder 01/30/2018   con't meds F/u counselor   Mixed connective tissue disease    with Raynaud's   Obstructive sleep apnea 01/30/2018   F/u with pulm; May be cause of some of her memory/concentration problems   Osteoarthritis    Osteoporosis    Palpitations 02/12/2008   Pansinusitis 01/30/2018   Peripheral vertigo 04/30/2007   Resolved rto prn   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    Family History  Problem Relation Age of Onset   Breast cancer Mother        possibly 74's   Arthritis Mother        rheumatoid   Cancer Mother        breast   Heart disease Mother 41       MI   Hyperlipidemia Mother     Thyroid disease Mother    Depression Mother    Anxiety disorder Mother    Heart disease Father        MI   Hypertension Father    Stroke Father    Diabetes Sister    Hypertension Sister    Hyperlipidemia Sister    Memory loss Maternal Aunt    Colon cancer Maternal Grandfather    Esophageal cancer Neg Hx    Rectal cancer Neg Hx    Stomach cancer Neg Hx     Social History   Socioeconomic History   Marital status: Legally Separated    Spouse name: Not on file   Number of children: 3   Years of education: 14   Highest education level: Some college, no degree  Occupational History   Occupation: Security  Tobacco Use   Smoking status: Every Day    Packs/day: 0.75    Years: 44.00    Total pack years: 33.00    Types: Cigarettes   Smokeless tobacco: Never  Substance and Sexual Activity   Alcohol use: Yes    Comment: 2-3 drinks per week (beer/wine)   Drug use: No   Sexual activity: Yes    Partners: Male    Birth control/protection: Post-menopausal  Other Topics Concern   Not on file  Social History Narrative   Not on file   Social Determinants of Health   Financial Resource Strain: Medium Risk (07/02/2022)   Overall Financial Resource Strain (CARDIA)    Difficulty of Paying Living Expenses: Somewhat hard  Food Insecurity: No Food Insecurity (07/02/2022)   Hunger Vital Sign    Worried About Running Out of Food in the Last Year: Never true    Ran Out of Food in the Last Year: Never true  Transportation Needs: No Transportation Needs (07/02/2022)   PRAPARE - Hydrologist (Medical): No    Lack of Transportation (Non-Medical): No  Physical Activity: Insufficiently Active (04/17/2022)   Exercise Vital Sign    Days of Exercise per Week: 1 day    Minutes of Exercise per Session: 40 min  Stress: Stress Concern Present (08/19/2022)   [place]    Feeling of Stress : Rather much   Social Connections: Moderately Isolated (12/28/2021)   Social Connection and Isolation Panel [NHANES]    Frequency of Communication with Friends and Family: More than three times a week    Frequency of Social Gatherings with Friends and Family: Once a week    Attends Religious Services: More than 4 times per year    Active  Member of Clubs or Organizations: No    Attends Archivist Meetings: Never    Marital Status: Never married  Intimate Partner Violence: Not At Risk (12/28/2021)   Humiliation, Afraid, Rape, and Kick questionnaire    Fear of Current or Ex-Partner: No    Emotionally Abused: No    Physically Abused: No    Sexually Abused: No    Outpatient Medications Prior to Visit  Medication Sig Dispense Refill   Accu-Chek Softclix Lancets lancets Use to check blood glucose once a day (DX: type 2 DM E11.65) 100 each 3   blood glucose meter kit and supplies KIT Dispense based on patient and insurance preference. Use up to four times daily as directed. (FOR ICD-9 250.00, 250.01). 1 each 0   Blood Glucose Monitoring Suppl (ACCU-CHEK GUIDE) w/Device KIT Use to check blood glucose daily (DX: type 2 DM E 11.65) 1 kit 0   gabapentin (NEURONTIN) 600 MG tablet Take 1 tablet (600 mg total) by mouth 3 (three) times daily. 300 tablet 2   glucose blood (ACCU-CHEK GUIDE) test strip Use to check blood glucose once a day (Dx: type 2 DM / E11.65) 100 each 3   hydrochlorothiazide (HYDRODIURIL) 25 MG tablet Take 1 tablet (25 mg total) by mouth daily. 90 tablet 2   levothyroxine (SYNTHROID) 50 MCG tablet TAKE 1 TABLET BY MOUTH DAILY 100 tablet 2   losartan (COZAAR) 100 MG tablet TAKE 1 TABLET BY MOUTH ONCE  DAILY 100 tablet 2   magic mouthwash w/lidocaine SOLN Take 5 mLs by mouth 4 (four) times daily as needed for mouth pain. Swish and Spit 240 mL 0   metFORMIN (GLUCOPHAGE-XR) 500 MG 24 hr tablet Take 2 tablets (1,000 mg total) by mouth daily. With largest meal. 180 tablet 3   montelukast  (SINGULAIR) 10 MG tablet Take 1 tablet (10 mg total) by mouth at bedtime. 90 tablet 1   nicotine (NICODERM CQ - DOSED IN MG/24 HOURS) 14 mg/24hr patch Place 1 patch (14 mg total) onto the skin daily. Remove and apply new patch each morning. 28 patch 0   NON [place]  Scar Cream -  Verapamil 10%, Pentoxifylline 5% Apply 1-2 grams to affected area 3-4 times daily Qty. 120 gm 3 refills     oxybutynin (DITROPAN-XL) 10 MG 24 hr tablet Take 1 tablet (10 mg total) by mouth at bedtime. 30 tablet 2   pravastatin (PRAVACHOL) 40 MG tablet Take 1 tablet (40 mg total) by mouth daily. For lowering cholesterol and heart health 90 tablet 3   azelastine (ASTELIN) 0.1 % nasal spray Place 2 sprays into both nostrils 2 (two) times daily. Use in each nostril as directed 30 mL 5   metoprolol succinate (TOPROL-XL) 100 MG 24 hr tablet Take 1 tablet (100 mg total) by mouth daily. Take with or immediately following a meal. 90 tablet 1   PARoxetine (PAXIL) 40 MG tablet Take 1 tablet (40 mg total) by mouth every morning. 90 tablet 0   Semaglutide,0.25 or 0.5MG/DOS, (OZEMPIC, 0.25 OR 0.5 MG/DOSE,) 2 MG/3ML SOPN Inject 0.47m weekly for 4 weeks, then increase to 0.567mweekly thereafter. (Patient not taking: Reported on 10/03/2022) 3 mL 1   No facility-administered medications prior to visit.    Allergies  Allergen Reactions   Penicillins    Codeine Rash and Other (See Comments)    dizziness    Review of Systems  Constitutional:  Positive for malaise/fatigue. Negative for fever.  HENT:  Negative for congestion.  Eyes:  Negative for blurred vision.  Respiratory:  Negative for cough and shortness of breath.   Cardiovascular:  Negative for chest pain, palpitations and leg swelling.  Gastrointestinal:  Negative for vomiting.  Musculoskeletal:  Negative for back pain.  Skin:  Negative for rash.  Neurological:  Negative for loss of consciousness and headaches.       Objective:    Physical  Exam Vitals and nursing note reviewed.  Constitutional:      General: She is not in acute distress.    Appearance: Normal appearance. She is well-developed. She is not ill-appearing.  HENT:     Head: Normocephalic and atraumatic.     Right Ear: External ear normal.     Left Ear: External ear normal.  Eyes:     Extraocular Movements: Extraocular movements intact.     Conjunctiva/sclera: Conjunctivae normal.     Pupils: Pupils are equal, round, and reactive to light.  Neck:     Thyroid: No thyromegaly.     Vascular: No carotid bruit or JVD.  Cardiovascular:     Rate and Rhythm: Normal rate and regular rhythm.     Heart sounds: Normal heart sounds. No murmur heard.    No gallop.  Pulmonary:     Effort: Pulmonary effort is normal. No respiratory distress.     Breath sounds: Normal breath sounds. No wheezing or rales.  Chest:     Chest wall: No tenderness.  Musculoskeletal:     Cervical back: Normal range of motion and neck supple.  Skin:    General: Skin is warm and dry.  Neurological:     Mental Status: She is alert and oriented to person, place, and time.  Psychiatric:        Judgment: Judgment normal.     BP 132/84 (BP Location: Left Arm, Patient Position: Sitting, Cuff Size: Normal)   Pulse 79   Temp 97.9 F (36.6 C) (Oral)   Resp 18   Ht _0  (1.6 m)   Wt 170 lb 6.4 oz (77.3 kg)   SpO2 97%   BMI 30.19 kg/m  Wt Readings from Last 3 Encounters:  10/08/22 170 lb 6.4 oz (77.3 kg)  08/09/22 168 lb 9.6 oz (76.5 kg)  07/23/22 170 lb (77.1 kg)    Diabetic Foot Exam - Simple   No data filed    Lab Results  Component Value Date   WBC 7.3 10/08/2022   HGB 12.0 10/08/2022   HCT 35.9 (L) 10/08/2022   PLT 275.0 10/08/2022   GLUCOSE 91 10/08/2022   CHOL 166 10/08/2022   TRIG 87.0 10/08/2022   HDL 53.20 10/08/2022   LDLDIRECT 126.3 06/06/2011   LDLCALC 95 10/08/2022   ALT 13 10/08/2022   AST 19 10/08/2022   NA 138 10/08/2022   K 4.3 10/08/2022   CL 103  10/08/2022   CREATININE 0.75 10/08/2022   BUN 14 10/08/2022   CO2 28 10/08/2022   TSH 0.67 08/09/2022   INR 1.0 05/25/2021   HGBA1C 7.1 (H) 10/08/2022   MICROALBUR 0.3 08/09/2022    Lab Results  Component Value Date   TSH 0.67 08/09/2022   Lab Results  Component Value Date   WBC 7.3 10/08/2022   HGB 12.0 10/08/2022   HCT 35.9 (L) 10/08/2022   MCV 85.2 10/08/2022   PLT 275.0 10/08/2022   Lab Results  Component Value Date   NA 138 10/08/2022   K 4.3 10/08/2022   CO2 28 10/08/2022   GLUCOSE 91  10/08/2022   BUN 14 10/08/2022   CREATININE 0.75 10/08/2022   BILITOT 0.6 10/08/2022   ALKPHOS 95 10/08/2022   AST 19 10/08/2022   ALT 13 10/08/2022   PROT 7.8 10/08/2022   ALBUMIN 4.1 10/08/2022   CALCIUM 9.9 10/08/2022   ANIONGAP 11 08/31/2014   GFR 82.51 10/08/2022   Lab Results  Component Value Date   CHOL 166 10/08/2022   Lab Results  Component Value Date   HDL 53.20 10/08/2022   Lab Results  Component Value Date   LDLCALC 95 10/08/2022   Lab Results  Component Value Date   TRIG 87.0 10/08/2022   Lab Results  Component Value Date   CHOLHDL 3 10/08/2022   Lab Results  Component Value Date   HGBA1C 7.1 (H) 10/08/2022       Assessment & Plan:   Problem List Items Addressed This Visit       Unprioritized   Type 2 diabetes mellitus with hyperglycemia, without long-term current use of insulin ([place]) - Primary    hgba1c to be checked ,  minimize simple carbs. Increase exercise as tolerated. Continue current meds       Relevant Medications   blood glucose meter kit and supplies KIT   Other Relevant Orders   Lipid panel (Completed)   CBC with Differential/Platelet (Completed)   Comprehensive metabolic panel (Completed)   Hemoglobin A1c (Completed)   Microalbumin / creatinine urine ratio   Hyperlipidemia    Encourage heart healthy diet such as MIND or DASH diet, increase exercise, avoid trans fats, simple carbohydrates and processed foods, consider a  krill or fish or flaxseed oil cap daily.        Relevant Medications   metoprolol succinate (TOPROL-XL) 100 MG 24 hr tablet   Generalized anxiety disorder   Relevant Medications   PARoxetine (PAXIL) 40 MG tablet   Fatigue    Check labs       Essential hypertension    Well controlled, no changes to meds. Encouraged heart healthy diet such as the DASH diet and exercise as tolerated.        Relevant Medications   metoprolol succinate (TOPROL-XL) 100 MG 24 hr tablet   Other Relevant Orders   Lipid panel (Completed)   CBC with Differential/Platelet (Completed)   Comprehensive metabolic panel (Completed)   Hemoglobin A1c (Completed)   Microalbumin / creatinine urine ratio   Allergies   Relevant Medications   azelastine (ASTELIN) 0.1 % nasal spray   Other Visit Diagnoses     Primary hypertension       Relevant Medications   metoprolol succinate (TOPROL-XL) 100 MG 24 hr tablet   Other Relevant Orders   Lipid panel (Completed)   CBC with Differential/Platelet (Completed)   Comprehensive metabolic panel (Completed)   Hemoglobin A1c (Completed)   Microalbumin / creatinine urine ratio   Hyperlipidemia associated with type 2 diabetes mellitus ([facility])       Relevant Medications   metoprolol succinate (TOPROL-XL) 100 MG 24 hr tablet   Other Relevant Orders   Lipid panel (Completed)   CBC with Differential/Platelet (Completed)   Comprehensive metabolic panel (Completed)   Hemoglobin A1c (Completed)   Microalbumin / creatinine urine ratio      Meds ordered this encounter  Medications   PARoxetine (PAXIL) 40 MG tablet    Sig: Take 1 tablet (40 mg total) by mouth every morning.    Dispense:  90 tablet    Refill:  3   metoprolol succinate (TOPROL-XL) 100  MG 24 hr tablet    Sig: Take 1 tablet (100 mg total) by mouth daily. Take with or immediately following a meal.    Dispense:  90 tablet    Refill:  1   blood glucose meter kit and supplies KIT    Sig: Dispense based on patient  and insurance preference. Use up to four times daily as directed.    Dispense:  1 each    Refill:  0    Order Specific Question:   Number of strips    Answer:   100    Order Specific Question:   Number of lancets    Answer:   100   azelastine (ASTELIN) 0.1 % nasal spray    Sig: Place 2 sprays into both nostrils 2 (two) times daily. Use in each nostril as directed    Dispense:  30 mL    Refill:  5    I, Ann Held, DO, personally preformed the services described in this documentation.  All medical record entries made by the scribe were at my direction and in my presence.  I have reviewed the chart and discharge instructions (if applicable) and agree that the record reflects my personal performance and is accurate and complete. 10/08/2022   I,Amber Collins,acting as a scribe for Ann Held, DO.,have documented all relevant documentation on the behalf of Ann Held, DO,as directed by  Ann Held, DO while in the presence of Ann Held, DO.    Ann Held, DO

## 2022-10-08 NOTE — Assessment & Plan Note (Signed)
Check labs 

## 2022-10-08 NOTE — Assessment & Plan Note (Signed)
Encourage heart healthy diet such as MIND or DASH diet, increase exercise, avoid trans fats, simple carbohydrates and processed foods, consider a krill or fish or flaxseed oil cap daily.  °

## 2022-10-09 LAB — MICROALBUMIN / CREATININE URINE RATIO
Creatinine,U: 123.3 mg/dL
Microalb Creat Ratio: 1.4 mg/g (ref 0.0–30.0)
Microalb, Ur: 1.7 mg/dL (ref 0.0–1.9)

## 2022-10-11 ENCOUNTER — Telehealth: Payer: Self-pay | Admitting: Pharmacist

## 2022-10-11 NOTE — Telephone Encounter (Signed)
Patient gave her first dose of Ozempic by her self yesterday but she was unsure if she got the full dose. She believes she might have taken pen / pen needle out of skin before she received full dose.  Reviewed injection technique. Reminded her to listen for clicks to stop and then count to 5 before removing Ozempic pen.   Patient will let me know if she would like to come into office for next dose or if she will try to do at home again. Next dose due 10/17/2022.

## 2022-10-17 ENCOUNTER — Ambulatory Visit (INDEPENDENT_AMBULATORY_CARE_PROVIDER_SITE_OTHER): Payer: Medicare Other | Admitting: Pharmacist

## 2022-10-17 DIAGNOSIS — I10 Essential (primary) hypertension: Secondary | ICD-10-CM

## 2022-10-17 DIAGNOSIS — E1165 Type 2 diabetes mellitus with hyperglycemia: Secondary | ICD-10-CM

## 2022-10-17 NOTE — Patient Instructions (Signed)
Debra [name] It was a pleasure speaking with you today.  Below is a summary of your health goals and summary of our recent visit.   Remember to take 1 more dose of Ozempic 0.'25mg'$  (10/24/2022); Then you will increase to take 0.'5mg'$  weekly.    As always if you have any questions or concerns especially regarding medications, please feel free to contact me either at the phone number below or with a MyChart message.   Keep up the good work!  [name], PharmD Clinical Pharmacist [facility] [phone] (direct line)  [phone] (main office number)   Patient verbalizes understanding of instructions and care plan provided today and agrees to view in [facility]. Active MyChart status and patient understanding of how to access instructions and care plan via MyChart confirmed with patient.

## 2022-10-17 NOTE — Progress Notes (Signed)
  Pharmacy Note  10/17/2022 Name: Debra [name] MRN: [id number] DOB: 06/13/1956  Subjective: Debra [name] is a 66 y.o. year old female who is a primary care patient of Lowne Chase, Yvonne R, DO. Clinical Pharmacist Practitioner referral was placed to assist with medication and diabetes management.    Patient is called today to follow up DM and new Ozempic start.   Engaged with patient by telephone for follow up visit today.  Type 2 DM - A1c has increased. Started Ozempic 0.25mg 10/03/2022. Patient gave dose on own last week but she was not sure she received full dose. Today we walked thru dose together over the phone.  She is tolerating Ozempic well - no nausea or GI symptoms. She has not noticed big difference in appetite yet.  She continues to keep a food journal. Eats a lot late at night. Hypertension: blood pressure has been at goal recently. Currently taking hydrochlorothiazide, losartan, metoprolol succinate. Denies dizziness.  Has not checked blood pressure at home recently due to suspected inaccuracy of her home blood pressure monitor.   Objective: Review of patient status, including review of consultants reports, laboratory and other test data, was performed as part of comprehensive evaluation and provision of chronic care management services.   Lab Results  Component Value Date   CREATININE 0.75 10/08/2022   CREATININE 0.74 08/09/2022   CREATININE 0.78 01/01/2022    Lab Results  Component Value Date   HGBA1C 7.1 (H) 10/08/2022       Component Value Date/Time   CHOL 166 10/08/2022 1353   CHOL 206 (H) 07/22/2018 1057   TRIG 87.0 10/08/2022 1353   HDL 53.20 10/08/2022 1353   HDL 48 07/22/2018 1057   CHOLHDL 3 10/08/2022 1353   VLDL 17.4 10/08/2022 1353   LDLCALC 95 10/08/2022 1353   LDLCALC 80 08/09/2022 1402   LDLDIRECT 126.3 06/06/2011 1533     Clinical ASCVD: Yes  The 10-year ASCVD risk score (Arnett DK, et al., 2019) is: 37.2%   Values used to  calculate the score:     Age: 66 years     Sex: Female     Is Non-Hispanic African American: Yes     Diabetic: Yes     Tobacco smoker: Yes     Systolic Blood Pressure: 132 mmHg     Is BP treated: Yes     HDL Cholesterol: 53.2 mg/dL     Total Cholesterol: 166 mg/dL    BP Readings from Last 3 Encounters:  10/08/22 132/84  08/09/22 130/80  07/23/22 128/80     Allergies  Allergen Reactions   Penicillins    Codeine Rash and Other (See Comments)    dizziness    Medications Reviewed Today     Reviewed by Lowne Chase, Yvonne R, DO (Physician) on 10/08/22 at 1805  Med List Status: <None>   Medication Order Taking? Sig Documenting Provider Last Dose Status Informant  Accu-Chek Softclix Lancets lancets [id number] Yes Use to check blood glucose once a day (DX: type 2 DM E11.65) Lowne Chase, Yvonne R, DO Taking Active   azelastine (ASTELIN) 0.1 % nasal spray [id number]  Place 2 sprays into both nostrils 2 (two) times daily. Use in each nostril as directed Lowne Chase, Yvonne R, DO  Active   blood glucose meter kit and supplies KIT [id number] Yes Dispense based on patient and insurance preference. Use up to four times daily as directed. (FOR ICD-9 250.00, 250.01). Lowne Chase, Yvonne R, DO Taking Active     blood glucose meter kit and supplies KIT [id number] Yes Dispense based on patient and insurance preference. Use up to four times daily as directed. Carollee Herter, Kendrick Fries R, DO  Active   Blood Glucose Monitoring Suppl (ACCU-CHEK GUIDE) w/Device KIT [id number] Yes Use to check blood glucose daily (DX: type 2 DM E 11.65) Lowne Chase, Yvonne R, DO Taking Active   gabapentin (NEURONTIN) 600 MG tablet [id number] Yes Take 1 tablet (600 mg total) by mouth 3 (three) times daily. Roma Schanz R, DO Taking Active   glucose blood (ACCU-CHEK GUIDE) test strip [id number] Yes Use to check blood glucose once a day (Dx: type 2 DM / E11.65) Carollee Herter, Alferd Apa, DO Taking Active   hydrochlorothiazide  (HYDRODIURIL) 25 MG tablet [id number] Yes Take 1 tablet (25 mg total) by mouth daily. Carollee Herter, Kendrick Fries R, DO Taking Active   levothyroxine (SYNTHROID) 50 MCG tablet [id number] Yes TAKE 1 TABLET BY MOUTH DAILY Carollee Herter, Alferd Apa, DO Taking Active   losartan (COZAAR) 100 MG tablet [id number] Yes TAKE 1 TABLET BY MOUTH ONCE  DAILY Ann Held, DO Taking Active   magic mouthwash w/lidocaine SOLN [id number] Yes Take 5 mLs by mouth 4 (four) times daily as needed for mouth pain. Swish and [address], [place], DO Taking Active   metFORMIN (GLUCOPHAGE-XR) 500 MG 24 hr tablet [id number] Yes Take 2 tablets (1,000 mg total) by mouth daily. With largest meal. Carollee Herter, Alferd Apa, DO Taking Active   metoprolol succinate (TOPROL-XL) 100 MG 24 hr tablet [id number]  Take 1 tablet (100 mg total) by mouth daily. Take with or immediately following a meal. Carollee Herter, Alferd Apa, DO  Active   montelukast (SINGULAIR) 10 MG tablet [id number] Yes Take 1 tablet (10 mg total) by mouth at bedtime. Carollee Herter, Kendrick Fries R, DO Taking Active   nicotine (NICODERM CQ - DOSED IN MG/24 HOURS) 14 mg/24hr patch [id number] Yes Place 1 patch (14 mg total) onto the skin daily. Remove and apply new patch each morning. Carollee Herter, [place], DO Taking Active   NON FORMULARY [id number] Yes [facility]  Scar Cream -  Verapamil 10%, Pentoxifylline 5% Apply 1-2 grams to affected area 3-4 times daily Qty. 120 gm 3 refills [provider] Taking Active   oxybutynin (DITROPAN-XL) 10 MG 24 hr tablet [id number] Yes Take 1 tablet (10 mg total) by mouth at bedtime. Ann Held, DO Taking Active     Discontinued 01/17/22 1530 (Reorder)   PARoxetine (PAXIL) 40 MG tablet [id number]  Take 1 tablet (40 mg total) by mouth every morning. Ann Held, DO  Active   pravastatin (PRAVACHOL) 40 MG tablet [id number] Yes Take 1 tablet (40 mg total) by mouth daily. For lowering cholesterol and heart health Carollee Herter,  Alferd Apa, DO Taking Active   Semaglutide,0.25 or 0.5MG/DOS, (OZEMPIC, 0.25 OR 0.5 MG/DOSE,) 2 MG/3ML SOPN [id number] No Inject 0.42m weekly for 4 weeks, then increase to 0.553mweekly thereafter.  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  Wound infection 10/04/2021   Wound of right ankle 09/27/2021   Trigger thumb, right thumb 09/13/2021   Pruritic dermatitis 05/25/2021   Vaginal discharge 05/25/2021   Balance disorder 05/25/2021   Dizziness 05/25/2021   Osteoarthritis    Vitamin D deficiency    Urinary frequency 10/31/2020   Chronic pain of both shoulders 10/31/2020   Female pattern baldness 10/06/2020   Myalgia 10/06/2020   Urge incontinence of urine 10/06/2020   History of diabetes mellitus, type II 01/11/2020   Allergies 02/03/2019   Acute vaginitis 04/22/2018   Morbid obesity 04/22/2018   Generalized anxiety disorder 04/22/2018   Obstructive sleep apnea 01/30/2018   Major depressive disorder 01/30/2018   Pansinusitis 01/30/2018   Aortic atherosclerosis 10/10/2017   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   Hyperlipidemia 04/17/2017   Anemia, iron deficiency 04/17/2017   Atypical chest pain 08/31/2014   Arm weakness 08/31/2014   Hearing loss 07/24/2012   Hypothyroidism 11/02/2010   Essential hypertension 08/31/2010   Hyperglycemia, fasting 16/24/4695   Diastolic dysfunction 05/18/5749   Back pain with radiculopathy 01/25/2010   Fatigue 01/25/2010   Tobacco abuse 12/01/2008   Restless legs syndrome 12/01/2008   Leg pain, bilateral 11/17/2008    Palpitations 02/12/2008   GERD (gastroesophageal reflux disease) 07/16/2007   Hiatal hernia 07/16/2007   Knee pain 07/16/2007   Low back pain 07/16/2007   Peripheral vertigo 04/30/2007   Insomnia 04/30/2007   Abdominal pain, right upper quadrant 04/30/2007     Medication Assistance:   patient is duel eligible - [place] Medicare + Medicaid   Assessment / Plan: Type 2 DM  Provided instruction over phone for administration of Ozmepic 0.424m weekly today.  Patient will take 0.230mweekly for 1 more dose 10/24/2022 and then increase to 0.24m52meekly.  Reviewed proper injeciton technique and site selection. Discussed proper disposal of pen needle and how to store OzeSyracuseheck B12 level with next labs.   Hypertension: controlled Continue current medication for blood pressure - hydrochlorothiazide, losartan, metoprolol succinate.  She will try to purchase new blood pressure cuff with her [facility].   Follow Up:  Telephone follow up appointment with care management team member scheduled for:  1 to 2 weeks   TamCherre RobinsharmD Clinical Pharmacist [phone]

## 2022-10-27 DIAGNOSIS — I10 Essential (primary) hypertension: Secondary | ICD-10-CM

## 2022-10-27 DIAGNOSIS — E1165 Type 2 diabetes mellitus with hyperglycemia: Secondary | ICD-10-CM

## 2022-11-06 DIAGNOSIS — H43812 Vitreous degeneration, left eye: Secondary | ICD-10-CM | POA: Diagnosis not present

## 2022-11-06 LAB — HM DIABETES EYE EXAM

## 2022-11-08 ENCOUNTER — Telehealth: Payer: Medicare Other

## 2022-11-19 ENCOUNTER — Other Ambulatory Visit: Payer: Self-pay | Admitting: Family Medicine

## 2022-11-19 DIAGNOSIS — G8929 Other chronic pain: Secondary | ICD-10-CM

## 2022-11-28 ENCOUNTER — Other Ambulatory Visit: Payer: Self-pay | Admitting: Family Medicine

## 2022-11-28 DIAGNOSIS — E1165 Type 2 diabetes mellitus with hyperglycemia: Secondary | ICD-10-CM

## 2022-12-06 ENCOUNTER — Other Ambulatory Visit: Payer: Self-pay | Admitting: Family Medicine

## 2022-12-09 ENCOUNTER — Other Ambulatory Visit: Payer: Self-pay | Admitting: Family Medicine

## 2022-12-09 DIAGNOSIS — E1165 Type 2 diabetes mellitus with hyperglycemia: Secondary | ICD-10-CM

## 2022-12-13 ENCOUNTER — Other Ambulatory Visit: Payer: Self-pay | Admitting: Family Medicine

## 2022-12-13 DIAGNOSIS — T7840XD Allergy, unspecified, subsequent encounter: Secondary | ICD-10-CM

## 2022-12-16 ENCOUNTER — Other Ambulatory Visit: Payer: Self-pay | Admitting: Family Medicine

## 2022-12-16 DIAGNOSIS — I10 Essential (primary) hypertension: Secondary | ICD-10-CM

## 2022-12-16 DIAGNOSIS — E039 Hypothyroidism, unspecified: Secondary | ICD-10-CM

## 2022-12-18 ENCOUNTER — Telehealth: Payer: Self-pay | Admitting: Family Medicine

## 2022-12-18 NOTE — Telephone Encounter (Signed)
Copied from [place] [id number]. Topic: Medicare AWV >> Dec 18, 2022 10:39 AM Devoria Glassing wrote: Reason for CRM: Called patient to schedule Medicare Annual Wellness Visit (AWV). No voicemail available to leave a message.  Last date of AWV: 12/29/2022  Please schedule an appointment at any time with [facility].  If any questions, please contact me.  Thank you ,  [place] Direct Dial: [phone]

## 2022-12-26 ENCOUNTER — Telehealth: Payer: Self-pay | Admitting: Family Medicine

## 2022-12-26 NOTE — Telephone Encounter (Signed)
Patient called requesting a call back from Tammy regarding some medication questions. Please advise.

## 2022-12-27 NOTE — Telephone Encounter (Signed)
Patient states she is taking Ozempic 0.'5mg'$  once a week - started this dose last week. She states that she has not noticed a change in appetite or decrease in weight in fact she feels that she has gained weight and appetite is the same.  Her last Ozempic dose was 0.'5mg'$  yesterday 12/26/2022. Recommended she give additional 0.'5mg'$  dose of Ozempic today to equal '1mg'$  for the week to see if she notices a decrease in appetite.  She has follow up with Dr Carollee Herter Tuesday March 6.  We could try Mounjaro but I worry about supply issues. If we start Mounjaro I would start with either '5mg'$  or 7.'5mg'$  dose weekly since she is not naive to GLP therapy.

## 2022-12-31 ENCOUNTER — Telehealth: Payer: Self-pay | Admitting: Pharmacist

## 2022-12-31 ENCOUNTER — Ambulatory Visit (INDEPENDENT_AMBULATORY_CARE_PROVIDER_SITE_OTHER): Payer: 59 | Admitting: *Deleted

## 2022-12-31 DIAGNOSIS — Z0000 Encounter for general adult medical examination without abnormal findings: Secondary | ICD-10-CM

## 2022-12-31 DIAGNOSIS — Z720 Tobacco use: Secondary | ICD-10-CM | POA: Diagnosis not present

## 2022-12-31 MED ORDER — SEMAGLUTIDE (1 MG/DOSE) 4 MG/3ML ~~LOC~~ SOPN: 1.0000 mg | PEN_INJECTOR | SUBCUTANEOUS | 1 refills | Status: DC

## 2022-12-31 NOTE — Telephone Encounter (Signed)
Patient called to report that she did take the second dose of Ozempic 0.'5mg'$  on Friday (total of '1mg'$  last week). She states she felt bloated on Sunday and yesterday.  Discussed that Ozempic can cause some mild bloating / nausea if she does not pay attention to when her body feels full. She states that she does now feel that she might have over eaten and that is what lead to the bloating.  Also recommended is she feels constipated she can try adding either Benefiber or Mirlax to beverage of choice once a day or as needed to see if that helps.  Patient would like to continue Ozempic '1mg'$  weekly.  Rx sent in.

## 2022-12-31 NOTE — Patient Instructions (Signed)
Debra [name] , Thank you for taking time to come for your Medicare Wellness Visit. I appreciate your ongoing commitment to your health goals. Please review the following plan we discussed and let me know if I can assist you in the future.    This is a list of the screening recommended for you and due dates:  Health Maintenance  Topic Date Due   Screening for Lung Cancer  08/27/2019   Pneumonia Vaccine (2 of 2 - PCV) 02/27/2022   Flu Shot  05/28/2022   Mammogram  06/19/2022   COVID-19 Vaccine (6 - 2023-24 season) 06/28/2022   Eye exam for diabetics  02/07/2023   Hemoglobin A1C  04/09/2023   DEXA scan (bone density measurement)  06/20/2023   Complete foot exam   08/10/2023   Yearly kidney function blood test for diabetes  10/09/2023   Yearly kidney health urinalysis for diabetes  10/09/2023   Medicare Annual Wellness Visit  12/31/2023   Colon Cancer Screening  07/27/2031   DTaP/Tdap/Td vaccine (4 - Td or Tdap) 09/28/2031   Hepatitis C Screening: USPSTF Recommendation to screen - Ages 53-79 yo.  Completed   Zoster (Shingles) Vaccine  Completed   HPV Vaccine  Aged Out     Next appointment: Follow up in one year for your annual wellness visit.   Preventive Care 33 Years and Older, Female Preventive care refers to lifestyle choices and visits with your health care provider that can promote health and wellness. What does preventive care include? A yearly physical exam. This is also called an annual well check. Dental exams once or twice a year. Routine eye exams. Ask your health care provider how often you should have your eyes checked. Personal lifestyle choices, including: Daily care of your teeth and gums. Regular physical activity. Eating a healthy diet. Avoiding tobacco and drug use. Limiting alcohol use. Practicing safe sex. Taking low-dose aspirin every day. Taking vitamin and mineral supplements as recommended by your health care provider. What happens during an annual  well check? The services and screenings done by your health care provider during your annual well check will depend on your age, overall health, lifestyle risk factors, and family history of disease. Counseling  Your health care provider may ask you questions about your: Alcohol use. Tobacco use. Drug use. Emotional well-being. Home and relationship well-being. Sexual activity. Eating habits. History of falls. Memory and ability to understand (cognition). Work and work Statistician. Reproductive health. Screening  You may have the following tests or measurements: Height, weight, and BMI. Blood pressure. Lipid and cholesterol levels. These may be checked every 5 years, or more frequently if you are over 79 years old. Skin check. Lung cancer screening. You may have this screening every year starting at age 40 if you have a 30-pack-year history of smoking and currently smoke or have quit within the past 15 years. Fecal occult blood test (FOBT) of the stool. You may have this test every year starting at age 29. Flexible sigmoidoscopy or colonoscopy. You may have a sigmoidoscopy every 5 years or a colonoscopy every 10 years starting at age 51. Hepatitis C blood test. Hepatitis B blood test. Sexually transmitted disease (STD) testing. Diabetes screening. This is done by checking your blood sugar (glucose) after you have not eaten for a while (fasting). You may have this done every 1-3 years. Bone density scan. This is done to screen for osteoporosis. You may have this done starting at age 15. Mammogram. This may be done every  1-2 years. Talk to your health care provider about how often you should have regular mammograms. Talk with your health care provider about your test results, treatment options, and if necessary, the need for more tests. Vaccines  Your health care provider may recommend certain vaccines, such as: Influenza vaccine. This is recommended every year. Tetanus, diphtheria,  and acellular pertussis (Tdap, Td) vaccine. You may need a Td booster every 10 years. Zoster vaccine. You may need this after age 37. Pneumococcal 13-valent conjugate (PCV13) vaccine. One dose is recommended after age 54. Pneumococcal polysaccharide (PPSV23) vaccine. One dose is recommended after age 17. Talk to your health care provider about which screenings and vaccines you need and how often you need them. This information is not intended to replace advice given to you by your health care provider. Make sure you discuss any questions you have with your health care provider. Document Released: 11/10/2015 Document Revised: 07/03/2016 Document Reviewed: 08/15/2015 Elsevier Interactive Patient Education  2017 [place] Prevention in the Home Falls can cause injuries. They can happen to people of all ages. There are many things you can do to make your home safe and to help prevent falls. What can I do on the outside of my home? Regularly fix the edges of walkways and driveways and fix any cracks. Remove anything that might make you trip as you walk through a door, such as a raised step or threshold. Trim any bushes or trees on the path to your home. Use bright outdoor lighting. Clear any walking paths of anything that might make someone trip, such as rocks or tools. Regularly check to see if handrails are loose or broken. Make sure that both sides of any steps have handrails. Any raised decks and porches should have guardrails on the edges. Have any leaves, snow, or ice cleared regularly. Use sand or salt on walking paths during winter. Clean up any spills in your garage right away. This includes oil or grease spills. What can I do in the bathroom? Use night lights. Install grab bars by the toilet and in the tub and shower. Do not use towel bars as grab bars. Use non-skid mats or decals in the tub or shower. If you need to sit down in the shower, use a plastic, non-slip  stool. Keep the floor dry. Clean up any water that spills on the floor as soon as it happens. Remove soap buildup in the tub or shower regularly. Attach bath mats securely with double-sided non-slip rug tape. Do not have throw rugs and other things on the floor that can make you trip. What can I do in the bedroom? Use night lights. Make sure that you have a light by your bed that is easy to reach. Do not use any sheets or blankets that are too big for your bed. They should not hang down onto the floor. Have a firm chair that has side arms. You can use this for support while you get dressed. Do not have throw rugs and other things on the floor that can make you trip. What can I do in the kitchen? Clean up any spills right away. Avoid walking on wet floors. Keep items that you use a lot in easy-to-reach places. If you need to reach something above you, use a strong step stool that has a grab bar. Keep electrical cords out of the way. Do not use floor polish or wax that makes floors slippery. If you must use wax, use non-skid  floor wax. Do not have throw rugs and other things on the floor that can make you trip. What can I do with my stairs? Do not leave any items on the stairs. Make sure that there are handrails on both sides of the stairs and use them. Fix handrails that are broken or loose. Make sure that handrails are as long as the stairways. Check any carpeting to make sure that it is firmly attached to the stairs. Fix any carpet that is loose or worn. Avoid having throw rugs at the top or bottom of the stairs. If you do have throw rugs, attach them to the floor with carpet tape. Make sure that you have a light switch at the top of the stairs and the bottom of the stairs. If you do not have them, ask someone to add them for you. What else can I do to help prevent falls? Wear shoes that: Do not have high heels. Have rubber bottoms. Are comfortable and fit you well. Are closed at the  toe. Do not wear sandals. If you use a stepladder: Make sure that it is fully opened. Do not climb a closed stepladder. Make sure that both sides of the stepladder are locked into place. Ask someone to hold it for you, if possible. Clearly mark and make sure that you can see: Any grab bars or handrails. First and last steps. Where the edge of each step is. Use tools that help you move around (mobility aids) if they are needed. These include: Canes. Walkers. Scooters. Crutches. Turn on the lights when you go into a dark area. Replace any light bulbs as soon as they burn out. Set up your furniture so you have a clear path. Avoid moving your furniture around. If any of your floors are uneven, fix them. If there are any pets around you, be aware of where they are. Review your medicines with your doctor. Some medicines can make you feel dizzy. This can increase your chance of falling. Ask your doctor what other things that you can do to help prevent falls. This information is not intended to replace advice given to you by your health care provider. Make sure you discuss any questions you have with your health care provider. Document Released: 08/10/2009 Document Revised: 03/21/2016 Document Reviewed: 11/18/2014 Elsevier Interactive Patient Education  2017 [employer].

## 2022-12-31 NOTE — Progress Notes (Signed)
Subjective:   Debra [name] is a 67 y.o. female who presents for Medicare Annual (Subsequent) preventive examination.  I connected with  Debra [name] on 12/31/22 by a audio enabled telemedicine application and verified that I am speaking with the correct person using two identifiers.  Patient Location: Home  Provider Location: Office/Clinic  I discussed the limitations of evaluation and management by telemedicine. The patient expressed understanding and agreed to proceed.   Review of Systems     Cardiac Risk Factors include: advanced age (>13mn, >>72women);diabetes mellitus;dyslipidemia;hypertension     Objective:    There were no vitals filed for this visit. There is no height or weight on file to calculate BMI.     12/31/2022    1:42 PM 03/05/2022   11:32 AM 02/13/2022    3:43 PM 12/28/2021   12:11 PM 08/31/2014    4:55 PM  Advanced Directives  Does Patient Have a Medical Advance Directive? No No No No No  Would patient like information on creating a medical advance directive? No - Patient declined Yes (MAU/Ambulatory/Procedural Areas - Information given) No - Patient declined No - Patient declined Yes - Educational materials given    Current Medications (verified) Outpatient Encounter Medications as of 12/31/2022  Medication Sig   Accu-Chek Softclix Lancets lancets Use to check blood glucose once a day (DX: type 2 DM E11.65)   azelastine (ASTELIN) 0.1 % nasal spray Place 2 sprays into both nostrils 2 (two) times daily. Use in each nostril as directed   blood glucose meter kit and supplies KIT Dispense based on patient and insurance preference. Use up to four times daily as directed. (FOR ICD-9 250.00, 250.01).   blood glucose meter kit and supplies KIT Dispense based on patient and insurance preference. Use up to four times daily as directed.   Blood Glucose Monitoring Suppl (ACCU-CHEK GUIDE) w/Device KIT Use to check blood glucose daily (DX: type 2 DM E 11.65)    gabapentin (NEURONTIN) 600 MG tablet TAKE 1 TABLET BY MOUTH 3 TIMES  DAILY   glucose blood (ACCU-CHEK GUIDE) test strip Use to check blood glucose once a day (Dx: type 2 DM / E11.65)   hydrochlorothiazide (HYDRODIURIL) 25 MG tablet TAKE 1 TABLET BY MOUTH DAILY   levothyroxine (SYNTHROID) 50 MCG tablet TAKE 1 TABLET BY MOUTH DAILY   losartan (COZAAR) 100 MG tablet TAKE 1 TABLET BY MOUTH ONCE  DAILY   magic mouthwash w/lidocaine SOLN Take 5 mLs by mouth 4 (four) times daily as needed for mouth pain. Swish and Spit   metFORMIN (GLUCOPHAGE-XR) 500 MG 24 hr tablet Take 2 tablets (1,000 mg total) by mouth daily. With largest meal.   metoprolol succinate (TOPROL-XL) 100 MG 24 hr tablet Take 1 tablet (100 mg total) by mouth daily. Take with or immediately following a meal.   montelukast (SINGULAIR) 10 MG tablet TAKE 1 TABLET BY MOUTH AT BEDTIME   nicotine (NICODERM CQ - DOSED IN MG/24 HOURS) 14 mg/24hr patch Place 1 patch (14 mg total) onto the skin daily. Remove and apply new patch each morning.   NON FORMULARY [facility]  Scar Cream -  Verapamil 10%, Pentoxifylline 5% Apply 1-2 grams to affected area 3-4 times daily Qty. 120 gm 3 refills   oxybutynin (DITROPAN-XL) 10 MG 24 hr tablet Take 1 tablet (10 mg total) by mouth at bedtime.   PARoxetine (PAXIL) 40 MG tablet Take 1 tablet (40 mg total) by mouth every morning.   pravastatin (PRAVACHOL) 40  MG tablet Take 1 tablet (40 mg total) by mouth daily. For lowering cholesterol and heart health   Semaglutide, 1 MG/DOSE, 4 MG/3ML SOPN Inject 1 mg into the skin once a week.   [DISCONTINUED] PARoxetine (PAXIL) 30 MG tablet Take 1 tablet (30 mg total) by mouth daily.   No facility-administered encounter medications on file as of 12/31/2022.    Allergies (verified) Penicillins and Codeine   History: Past Medical History:  Diagnosis Date   Abdominal pain, right upper quadrant 04/30/2007   Allergies 02/03/2019   Allergy    Anemia, iron deficiency  04/17/2017   Aortic atherosclerosis 10/10/2017   Atypical chest pain 08/31/2014   Back pain with radiculopathy 01/25/2010   Check xray --- ? Cause of leg weakness   Chronic pain of both shoulders 10/31/2020   Constipation    DDD (degenerative disc disease)    Diabetes mellitus type II, uncontrolled [id number]   Diastolic dysfunction [id number]   Epilepsy    Childhood seziures; pt reports that she grew out of them and they have not occurred since childhood   Essential hypertension 08/31/2010   Poorly controlled will alter medications, encouraged DASH diet, minimize caffeine and obtain adequate sleep. Report concerning symptoms and follow up as directed and as needed [employer] metoprolol Edema may be c   Fatigue 01/25/2010   Generalized anxiety disorder 04/22/2018   Stable co'nt meds   GERD (gastroesophageal reflux disease) 07/16/2007   Hearing loss 07/24/2012   Refer to hearing clinic   Hiatal hernia 07/16/2007   Hyperglycemia, fasting 05/04/2010   Hyperlipidemia 04/17/2017   Encouraged heart healthy diet, increase exercise, avoid trans fats, consider a krill oil cap daily   Hypothyroidism 11/02/2010   con't meds; Lab Results Component Value TSH 1.44 08/12/2017   Insomnia 04/30/2007   Knee pain 07/16/2007   Leg pain, bilateral 11/17/2008   Low back pain 07/16/2007   Major depressive disorder 01/30/2018   con't meds F/u counselor   Mixed connective tissue disease    with Raynaud's   Obstructive sleep apnea 01/30/2018   F/u with pulm; May be cause of some of her memory/concentration problems   Osteoarthritis    Osteoporosis    Palpitations 02/12/2008   Pansinusitis 01/30/2018   Peripheral vertigo 04/30/2007   Resolved rto prn   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   Family History  Problem Relation Age of Onset   Breast cancer Mother        possibly 43's   Arthritis Mother        rheumatoid   Cancer Mother        breast   Heart disease Mother 57       MI   Hyperlipidemia Mother    Thyroid disease Mother    Depression Mother    Anxiety disorder Mother    Heart disease Father        MI   Hypertension Father    Stroke Father  Diabetes Sister    Hypertension Sister    Hyperlipidemia Sister    Memory loss Maternal Aunt    Colon cancer Maternal Grandfather    Esophageal cancer Neg Hx    Rectal cancer Neg Hx    Stomach cancer Neg Hx    Social History   Socioeconomic History   Marital status: Legally Separated    Spouse name: Not on file   Number of children: 3   Years of education: 14   Highest education level: Some college, no degree  Occupational History   Occupation: Security  Tobacco Use   Smoking status: Every Day    Packs/day: 0.75    Years: 44.00    Total pack years: 33.00    Types: Cigarettes   Smokeless tobacco: Never  Substance and Sexual Activity   Alcohol use: Yes    Comment: 2-3 drinks per week (beer/wine)   Drug use: No   Sexual activity: Yes    Partners: Male    Birth control/protection: Post-menopausal  Other Topics Concern   Not on file  Social History Narrative   Not on file   Social Determinants of Health   Financial Resource Strain: Medium Risk (07/02/2022)   Overall Financial Resource Strain (CARDIA)    Difficulty of Paying Living Expenses: Somewhat hard  Food Insecurity: No Food Insecurity (07/02/2022)   Hunger Vital Sign    Worried About Running Out of Food in the Last Year: Never true    Ran Out of Food in the Last Year: Never true  Transportation Needs: No Transportation Needs  (07/02/2022)   PRAPARE - Hydrologist (Medical): No    Lack of Transportation (Non-Medical): No  Physical Activity: Insufficiently Active (04/17/2022)   Exercise Vital Sign    Days of Exercise per Week: 1 day    Minutes of Exercise per Session: 40 min  Stress: Stress Concern Present (08/19/2022)   [place]    Feeling of Stress : Rather much  Social Connections: Moderately Isolated (12/28/2021)   Social Connection and Isolation Panel [NHANES]    Frequency of Communication with Friends and Family: More than three times a week    Frequency of Social Gatherings with Friends and Family: Once a week    Attends Religious Services: More than 4 times per year    Active Member of [employer] or Organizations: No    Attends Music therapist: Never    Marital Status: Never married    Tobacco Counseling Ready to quit: Not Answered Counseling given: Not Answered   Clinical Intake:  Pre-visit preparation completed: Yes  Pain : No/denies pain  Nutritional Risks: None Diabetes: Yes CBG done?: No Did pt. bring in CBG monitor from home?: No  How often do you need to have someone help you when you read instructions, pamphlets, or other written materials from your doctor or pharmacy?: 1 - Never   Activities of Daily Living    12/31/2022    1:51 PM  In your present state of health, do you have any difficulty performing the following activities:  Hearing? 1  Comment slight hearing loss  Vision? 1  Difficulty concentrating or making decisions? 1  Walking or climbing stairs? 0  Dressing or bathing? 0  Doing errands, shopping? 0  Preparing Food and eating ? N  Using the Toilet? N  In the past six months, have you accidently leaked urine? Y  Do you have problems with  loss of bowel control? N  Managing your Medications? N  Managing your Finances? N  Housekeeping or managing your Housekeeping?  N    Patient Care Team: Carollee Herter, Alferd Apa, DO as PCP - General Buford Dresser, MD as PCP - Cardiology (Cardiology) Leeroy Cha, MD as Consulting Physician (Neurosurgery) Chesley Mires, MD as Consulting Physician (Pulmonary Disease) [name], RPH-CPP (Pharmacist) Shea Evans Norva Riffle, LCSW as [facility] (Licensed Clinical Social Worker)  Indicate any recent [place] you may have received from other than [facility] providers in the past year (date may be approximate).     Assessment:   This is a routine wellness examination for [place].  Hearing/Vision screen No results found.  Dietary issues and exercise activities discussed: Current Exercise Habits: The patient does not participate in regular exercise at present, Exercise limited by: None identified   Goals Addressed   None    Depression Screen    12/31/2022    1:46 PM 08/19/2022    2:42 PM 08/13/2022   11:37 AM 08/09/2022    1:24 PM 08/09/2022    1:17 PM 05/21/2022   12:06 PM 04/17/2022    4:06 PM  PHQ 2/9 Scores  PHQ - 2 Score '5 2 2 1 '$ 0 2 2  PHQ- 9 Score '15 9 8   8 12    '$ Fall Risk    12/31/2022    1:42 PM 08/09/2022    1:24 PM 08/09/2022    1:17 PM 03/05/2022   11:33 AM 01/01/2022    5:10 PM  Fall Risk   Falls in the past year? 0 1 0 0 1  Number falls in past yr: 0 0 0 0 0  Injury with Fall? 0 0 0  0  Risk for fall due to : No Fall Risks    Impaired balance/gait  Follow up Falls evaluation completed Falls evaluation completed Falls evaluation completed  Falls evaluation completed;Falls prevention discussed    FALL RISK PREVENTION PERTAINING TO THE HOME:  Any stairs in or around the home? Yes  If so, are there any without handrails? No  Home free of loose throw rugs in walkways, pet beds, electrical cords, etc? Yes  Adequate lighting in your home to reduce risk of falls? Yes   ASSISTIVE DEVICES UTILIZED TO PREVENT FALLS:  Life alert? No  Use of a cane, walker  or w/c? No  Grab bars in the bathroom? Yes  Shower chair or bench in shower? No  Elevated toilet seat or a handicapped toilet? No   TIMED UP AND GO:  Was the test performed?  No, audio visit .    Cognitive Function:        12/31/2022    1:54 PM  6CIT Screen  What Year? 0 points  What month? 0 points  What time? 0 points  Count back from 20 0 points  Months in reverse 0 points  Repeat phrase 0 points  Total Score 0 points    Immunizations Immunization History  Administered Date(s) Administered   Fluad Quad(high Dose 65+) 08/30/2021   Influenza Whole 10/01/2007, 08/31/2010   Influenza,inj,Quad PF,6+ Mos 10/12/2013, 08/29/2015, 08/13/2016, 08/12/2017, 08/03/2018   Influenza-Unspecified 08/10/2019   PFIZER Comirnaty(Gray Top)Covid-19 Tri-Sucrose Vaccine 02/27/2021   PFIZER(Purple Top)SARS-COV-2 Vaccination 01/11/2020, 01/31/2020, 08/24/2020   Pfizer Covid-19 Vaccine Bivalent Booster 49yr & up 08/29/2021   Pneumococcal Polysaccharide-23 02/27/2021   Td 11/02/2010, 09/27/2021   Tdap 08/03/2018   Zoster Recombinat (Shingrix) 04/17/2017, 06/17/2017   Zoster, Live  03/12/2016    TDAP status: Up to date  Flu Vaccine status: Due, Education has been provided regarding the importance of this vaccine. Advised may receive this vaccine at local pharmacy or Health Dept. Aware to provide a copy of the vaccination record if obtained from local pharmacy or Health Dept. Verbalized acceptance and understanding.  Pneumococcal vaccine status: Due, Education has been provided regarding the importance of this vaccine. Advised may receive this vaccine at local pharmacy or Health Dept. Aware to provide a copy of the vaccination record if obtained from local pharmacy or Health Dept. Verbalized acceptance and understanding.  Covid-19 vaccine status: Information provided on how to obtain vaccines.   Qualifies for Shingles Vaccine? Yes   Zostavax completed Yes   Shingrix Completed?:  Yes  Screening Tests Health Maintenance  Topic Date Due   Lung Cancer Screening  08/27/2019   Pneumonia Vaccine 32+ Years old (2 of 2 - PCV) 02/27/2022   INFLUENZA VACCINE  05/28/2022   MAMMOGRAM  06/19/2022   COVID-19 Vaccine (6 - 2023-24 season) 06/28/2022   Medicare Annual Wellness (AWV)  12/29/2022   OPHTHALMOLOGY EXAM  02/07/2023   HEMOGLOBIN A1C  04/09/2023   DEXA SCAN  06/20/2023   FOOT EXAM  08/10/2023   Diabetic kidney evaluation - eGFR measurement  10/09/2023   Diabetic kidney evaluation - Urine ACR  10/09/2023   COLONOSCOPY (Pts 45-43yr Insurance coverage will need to be confirmed)  07/27/2031   DTaP/Tdap/Td (4 - Td or Tdap) 09/28/2031   Hepatitis C Screening  Completed   Zoster Vaccines- Shingrix  Completed   HPV VACCINES  Aged Out    Health Maintenance  Health Maintenance Due  Topic Date Due   Lung Cancer Screening  08/27/2019   Pneumonia Vaccine 68 Years old (2 of 2 - PCV) 02/27/2022   INFLUENZA VACCINE  05/28/2022   MAMMOGRAM  06/19/2022   COVID-19 Vaccine (6 - 2023-24 season) 06/28/2022   Medicare Annual Wellness (AWV)  12/29/2022    Colorectal cancer screening: Type of screening: Colonoscopy. Completed 07/26/21. Repeat every 10 years  Mammogram status: Completed 06/19/21. Repeat every year  Bone Density status: Completed 06/19/21. Results reflect: Bone density results: OSTEOPENIA. Repeat every 2 years.  Lung Cancer Screening: (Low Dose CT Chest recommended if Age 68-80years, 30 pack-year currently smoking OR have quit w/in 15years.) does qualify.   Lung Cancer Screening Referral: placed today  Additional Screening:  Hepatitis C Screening: does qualify; Completed 08/29/15  Vision Screening: Recommended annual ophthalmology exams for early detection of glaucoma and other disorders of the eye. Is the patient up to date with their annual eye exam?  Yes  Who is the provider or what is the name of the office in which the patient attends annual eye exams?  [place] If pt is not established with a provider, would they like to be referred to a provider to establish care? No .   Dental Screening: Recommended annual dental exams for proper oral hygiene  Community Resource Referral / Chronic Care Management: CRR required this visit?  No   CCM required this visit?  No      Plan:     I have personally reviewed and noted the following in the patient's chart:   Medical and social history Use of alcohol, tobacco or illicit drugs  Current medications and supplements including opioid prescriptions. Patient is not currently taking opioid prescriptions. Functional ability and status Nutritional status Physical activity Advanced directives List of other physicians Hospitalizations, surgeries,  and ER visits in previous 12 months Vitals Screenings to include cognitive, depression, and falls Referrals and appointments  In addition, I have reviewed and discussed with patient certain preventive protocols, quality metrics, and best practice recommendations. A written personalized care plan for preventive services as well as general preventive health recommendations were provided to patient.   Due to this being a telephonic visit, the after visit summary with patients personalized plan was offered to patient via mail or my-chart. Patient would like to access on my-chart.  Beatris Ship, [place]   12/31/2022   Nurse Notes: None

## 2023-02-11 ENCOUNTER — Encounter: Payer: Self-pay | Admitting: Family Medicine

## 2023-02-11 ENCOUNTER — Ambulatory Visit (INDEPENDENT_AMBULATORY_CARE_PROVIDER_SITE_OTHER): Payer: 59 | Admitting: Family Medicine

## 2023-02-11 VITALS — BP 132/80 | HR 98 | Temp 98.2°F | Resp 16 | Ht 63.0 in | Wt 168.5 lb

## 2023-02-11 DIAGNOSIS — R2689 Other abnormalities of gait and mobility: Secondary | ICD-10-CM

## 2023-02-11 DIAGNOSIS — R5383 Other fatigue: Secondary | ICD-10-CM | POA: Diagnosis not present

## 2023-02-11 DIAGNOSIS — I10 Essential (primary) hypertension: Secondary | ICD-10-CM | POA: Diagnosis not present

## 2023-02-11 DIAGNOSIS — E1169 Type 2 diabetes mellitus with other specified complication: Secondary | ICD-10-CM | POA: Diagnosis not present

## 2023-02-11 DIAGNOSIS — E1165 Type 2 diabetes mellitus with hyperglycemia: Secondary | ICD-10-CM

## 2023-02-11 DIAGNOSIS — D509 Iron deficiency anemia, unspecified: Secondary | ICD-10-CM | POA: Diagnosis not present

## 2023-02-11 DIAGNOSIS — E039 Hypothyroidism, unspecified: Secondary | ICD-10-CM | POA: Diagnosis not present

## 2023-02-11 DIAGNOSIS — R413 Other amnesia: Secondary | ICD-10-CM

## 2023-02-11 DIAGNOSIS — Z720 Tobacco use: Secondary | ICD-10-CM | POA: Diagnosis not present

## 2023-02-11 DIAGNOSIS — E785 Hyperlipidemia, unspecified: Secondary | ICD-10-CM

## 2023-02-11 DIAGNOSIS — E7800 Pure hypercholesterolemia, unspecified: Secondary | ICD-10-CM

## 2023-02-11 LAB — COMPREHENSIVE METABOLIC PANEL
ALT: 9 U/L (ref 0–35)
AST: 18 U/L (ref 0–37)
Albumin: 4.1 g/dL (ref 3.5–5.2)
Alkaline Phosphatase: 85 U/L (ref 39–117)
BUN: 14 mg/dL (ref 6–23)
CO2: 28 mEq/L (ref 19–32)
Calcium: 9.8 mg/dL (ref 8.4–10.5)
Chloride: 100 mEq/L (ref 96–112)
Creatinine, Ser: 0.8 mg/dL (ref 0.40–1.20)
GFR: 76.17 mL/min (ref >60.00)
Glucose, Bld: 98 mg/dL (ref 70–99)
Potassium: 4.3 mEq/L (ref 3.5–5.1)
Sodium: 136 mEq/L (ref 135–145)
Total Bilirubin: 0.6 mg/dL (ref 0.2–1.2)
Total Protein: 7.4 g/dL (ref 6.0–8.3)

## 2023-02-11 LAB — CBC WITH DIFFERENTIAL/PLATELET
Basophils Absolute: 0 10*3/uL (ref 0.0–0.1)
Basophils Relative: 0.5 % (ref 0.0–3.0)
Eosinophils Absolute: 0.1 10*3/uL (ref 0.0–0.7)
Eosinophils Relative: 0.8 % (ref 0.0–5.0)
HCT: 32.6 % — ABNORMAL LOW (ref 36.0–46.0)
Hemoglobin: 11 g/dL — ABNORMAL LOW (ref 12.0–15.0)
Lymphocytes Relative: 37.8 % (ref 12.0–46.0)
Lymphs Abs: 2.7 10*3/uL (ref 0.7–4.0)
MCHC: 33.9 g/dL (ref 30.0–36.0)
MCV: 84.7 fl (ref 78.0–100.0)
Monocytes Absolute: 0.6 10*3/uL (ref 0.1–1.0)
Monocytes Relative: 9 % (ref 3.0–12.0)
Neutro Abs: 3.6 10*3/uL (ref 1.4–7.7)
Neutrophils Relative %: 51.9 % (ref 43.0–77.0)
Platelets: 256 10*3/uL (ref 150.0–400.0)
RBC: 3.85 Mil/uL — ABNORMAL LOW (ref 3.87–5.11)
RDW: 13.7 % (ref 11.5–15.5)
WBC: 7 10*3/uL (ref 4.0–10.5)

## 2023-02-11 LAB — MICROALBUMIN / CREATININE URINE RATIO
Creatinine,U: 113.6 mg/dL
Microalb Creat Ratio: 0.6 mg/g (ref 0.0–30.0)
Microalb, Ur: <0.7 mg/dL (ref 0.0–1.9)

## 2023-02-11 LAB — LIPID PANEL
Cholesterol: 131 mg/dL (ref 0–200)
HDL: 46.8 mg/dL (ref >39.00)
LDL Cholesterol: 65 mg/dL (ref 0–99)
NonHDL: 84.55
Total CHOL/HDL Ratio: 3
Triglycerides: 100 mg/dL (ref 0.0–149.0)
VLDL: 20 mg/dL (ref 0.0–40.0)

## 2023-02-11 LAB — TSH: TSH: 0.64 u[IU]/mL (ref 0.35–5.50)

## 2023-02-11 LAB — HEMOGLOBIN A1C: Hgb A1c MFr Bld: 6.5 % (ref 4.6–6.5)

## 2023-02-11 MED ORDER — HYDROCHLOROTHIAZIDE 25 MG PO TABS: 25.0000 mg | ORAL_TABLET | Freq: Every day | ORAL | 2 refills | Status: DC

## 2023-02-11 MED ORDER — LOSARTAN POTASSIUM 100 MG PO TABS: 100.0000 mg | ORAL_TABLET | Freq: Every day | ORAL | 2 refills | Status: DC

## 2023-02-11 MED ORDER — METOPROLOL SUCCINATE ER 100 MG PO TB24: 100.0000 mg | ORAL_TABLET | Freq: Every day | ORAL | 1 refills | Status: DC

## 2023-02-11 NOTE — Assessment & Plan Note (Signed)
Encourage heart healthy diet such as MIND or DASH diet, increase exercise, avoid trans fats, simple carbohydrates and processed foods, consider a krill or fish or flaxseed oil cap daily.  °

## 2023-02-11 NOTE — Assessment & Plan Note (Signed)
Check labs today.

## 2023-02-11 NOTE — Progress Notes (Addendum)
Subjective:   By signing my name below, I, Debra [name], attest that this documentation has been prepared under the direction and in the presence of Debra Schultz, DO. 02/11/2023   Patient ID: Debra [name], female    DOB: [birth year], 67 y.o.   MRN: [id number]  Chief Complaint  Patient presents with   Follow-up    Fasting    HPI Patient is in today for a follow up visit.   She complains of difficulty maintaining balance. She is trying to exercise more at the gym. She is c/o memory loss as well. Her family is concerned as well. She is requesting a refill on 25 mg hydrochlorothiazide, 100 mg losartan, 100 mg metoprolol succinate.  She is measuring her blood sugar at home and reports it is measuring elevated.  Lab Results  Component Value Date   HGBA1C 7.1 (H) 10/08/2022    Past Medical History:  Diagnosis Date   Abdominal pain, right upper quadrant 04/30/2007   Allergies 02/03/2019   Allergy    Anemia, iron deficiency 04/17/2017   Aortic atherosclerosis 10/10/2017   Atypical chest pain 08/31/2014   Back pain with radiculopathy 01/25/2010   Check xray --- ? Cause of leg weakness   Chronic pain of both shoulders 10/31/2020   Constipation    DDD (degenerative disc disease)    Diabetes mellitus type II, uncontrolled 01/11/2020   Diastolic dysfunction 02/15/2010   Epilepsy    Childhood seziures; pt reports that she grew out of them and they have not occurred since childhood   Essential hypertension 08/31/2010   Poorly controlled will alter medications, encouraged DASH diet, minimize caffeine and obtain adequate sleep. Report concerning symptoms and follow up as directed and as needed [employer] metoprolol Edema may be c   Fatigue 01/25/2010   Generalized anxiety disorder 04/22/2018   Stable co'nt meds   GERD (gastroesophageal reflux disease) 07/16/2007   Hearing loss 07/24/2012   Refer to hearing clinic   Hiatal hernia 07/16/2007   Hyperglycemia, fasting  05/04/2010   Hyperlipidemia 04/17/2017   Encouraged heart healthy diet, increase exercise, avoid trans fats, consider a krill oil cap daily   Hypothyroidism 11/02/2010   con't meds; Lab Results Component Value TSH 1.44 08/12/2017   Insomnia 04/30/2007   Knee pain 07/16/2007   Leg pain, bilateral 11/17/2008   Low back pain 07/16/2007   Major depressive disorder 01/30/2018   con't meds F/u counselor   Mixed connective tissue disease    with Raynaud's   Obstructive sleep apnea 01/30/2018   F/u with pulm; May be cause of some of her memory/concentration problems   Osteoarthritis    Osteoporosis    Palpitations 02/12/2008   Pansinusitis 01/30/2018   Peripheral vertigo 04/30/2007   Resolved rto prn   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    Family History  Problem Relation Age of Onset   Breast cancer Mother        possibly 49's  Arthritis Mother        rheumatoid   Cancer Mother        breast   Heart disease Mother 62       MI   Hyperlipidemia Mother    Thyroid disease Mother    Depression Mother    Anxiety disorder Mother    Heart disease Father        MI   Hypertension Father    Stroke Father    Diabetes Sister    Hypertension Sister    Hyperlipidemia Sister    Memory loss Maternal Aunt    Colon cancer Maternal Grandfather    Esophageal cancer Neg Hx    Rectal cancer Neg Hx    Stomach cancer Neg Hx     Social History   Socioeconomic History   Marital status: Legally Separated    Spouse name: Not on file   Number of children: 3   Years of  education: 14   Highest education level: Some college, no degree  Occupational History   Occupation: Security  Tobacco Use   Smoking status: Every Day    Packs/day: 0.75    Years: 44.00    Additional pack years: 0.00    Total pack years: 33.00    Types: Cigarettes   Smokeless tobacco: Never  Substance and Sexual Activity   Alcohol use: Yes    Comment: 2-3 drinks per week (beer/wine)   Drug use: No   Sexual activity: Yes    Partners: Male    Birth control/protection: Post-menopausal  Other Topics Concern   Not on file  Social History Narrative   Not on file   Social Determinants of Health   Financial Resource Strain: Medium Risk (07/02/2022)   Overall Financial Resource Strain (CARDIA)    Difficulty of Paying Living Expenses: Somewhat hard  Food Insecurity: No Food Insecurity (07/02/2022)   Hunger Vital Sign    Worried About Running Out of Food in the Last Year: Never true    Ran Out of Food in the Last Year: Never true  Transportation Needs: No Transportation Needs (07/02/2022)   PRAPARE - Administrator, Civil Service (Medical): No    Lack of Transportation (Non-Medical): No  Physical Activity: Insufficiently Active (04/17/2022)   Exercise Vital Sign    Days of Exercise per Week: 1 day    Minutes of Exercise per Session: 40 min  Stress: Stress Concern Present (08/19/2022)   [employer] of Occupational Health - Occupational Stress Questionnaire    Feeling of Stress : Rather much  Social Connections: Moderately Isolated (12/28/2021)   Social Connection and Isolation Panel [NHANES]    Frequency of Communication with Friends and Family: More than three times a week    Frequency of Social Gatherings with Friends and Family: Once a week    Attends Religious Services: More than 4 times per year    Active Member of [employer] or Organizations: No    Attends Banker Meetings: Never    Marital Status: Never married  Intimate Partner Violence: Not At Risk  (12/28/2021)   Humiliation, Afraid, Rape, and Kick questionnaire    Fear of Current or Ex-Partner: No    Emotionally Abused: No    Physically Abused: No    Sexually Abused: No    Outpatient Medications Prior to Visit  Medication Sig Dispense Refill   Accu-Chek Softclix Lancets lancets Use to check blood glucose once a day (DX: type 2 DM E11.65) 100 each 3  azelastine (ASTELIN) 0.1 % nasal spray Place 2 sprays into both nostrils 2 (two) times daily. Use in each nostril as directed 30 mL 5   Blood Glucose Monitoring Suppl (ACCU-CHEK GUIDE) w/Device KIT Use to check blood glucose daily (DX: type 2 DM E 11.65) 1 kit 0   gabapentin (NEURONTIN) 600 MG tablet TAKE 1 TABLET BY MOUTH 3 TIMES  DAILY 300 tablet 1   glucose blood (ACCU-CHEK GUIDE) test strip Use to check blood glucose once a day (Dx: type 2 DM / E11.65) 100 each 3   levothyroxine (SYNTHROID) 50 MCG tablet TAKE 1 TABLET BY MOUTH DAILY 100 tablet 2   metFORMIN (GLUCOPHAGE-XR) 500 MG 24 hr tablet Take 2 tablets (1,000 mg total) by mouth daily. With largest meal. 180 tablet 3   montelukast (SINGULAIR) 10 MG tablet TAKE 1 TABLET BY MOUTH AT BEDTIME 90 tablet 0   nicotine (NICODERM CQ - DOSED IN MG/24 HOURS) 14 mg/24hr patch Place 1 patch (14 mg total) onto the skin daily. Remove and apply new patch each morning. 28 patch 0   oxybutynin (DITROPAN-XL) 10 MG 24 hr tablet Take 1 tablet (10 mg total) by mouth at bedtime. 30 tablet 2   PARoxetine (PAXIL) 40 MG tablet Take 1 tablet (40 mg total) by mouth every morning. 90 tablet 3   pravastatin (PRAVACHOL) 40 MG tablet Take 1 tablet (40 mg total) by mouth daily. For lowering cholesterol and heart health 90 tablet 3   Semaglutide, 1 MG/DOSE, 4 MG/3ML SOPN Inject 1 mg into the skin once a week. 3 mL 1   blood glucose meter kit and supplies KIT Dispense based on patient and insurance preference. Use up to four times daily as directed. 1 each 0   hydrochlorothiazide (HYDRODIURIL) 25 MG tablet TAKE 1  TABLET BY MOUTH DAILY 100 tablet 2   losartan (COZAAR) 100 MG tablet TAKE 1 TABLET BY MOUTH ONCE  DAILY 100 tablet 2   metoprolol succinate (TOPROL-XL) 100 MG 24 hr tablet Take 1 tablet (100 mg total) by mouth daily. Take with or immediately following a meal. 90 tablet 1   magic mouthwash w/lidocaine SOLN Take 5 mLs by mouth 4 (four) times daily as needed for mouth pain. Swish and Spit (Patient not taking: Reported on 02/11/2023) 240 mL 0   NON FORMULARY [facility]  Scar Cream -  Verapamil 10%, Pentoxifylline 5% Apply 1-2 grams to affected area 3-4 times daily Qty. 120 gm 3 refills (Patient not taking: Reported on 02/11/2023)     blood glucose meter kit and supplies KIT Dispense based on patient and insurance preference. Use up to four times daily as directed. (FOR ICD-9 250.00, 250.01). 1 each 0   PARoxetine (PAXIL) 30 MG tablet Take 1 tablet (30 mg total) by mouth daily. 90 tablet 1   No facility-administered medications prior to visit.    Allergies  Allergen Reactions   Penicillins    Codeine Rash and Other (See Comments)    dizziness    Review of Systems  Constitutional:  Negative for fever and malaise/fatigue.  HENT:  Negative for congestion.   Eyes:  Negative for blurred vision.  Respiratory:  Negative for shortness of breath.   Cardiovascular:  Negative for chest pain, palpitations and leg swelling.  Gastrointestinal:  Negative for abdominal pain, blood in stool and nausea.  Genitourinary:  Negative for dysuria and frequency.  Musculoskeletal:  Negative for falls.       (+)decreased balance  Skin:  Negative for rash.  Neurological:  Negative for dizziness, loss of consciousness and headaches.  Endo/Heme/Allergies:  Negative for environmental allergies.  Psychiatric/Behavioral:  Positive for memory loss. Negative for depression. The patient is not nervous/anxious.        Objective:    Physical Exam Vitals and nursing note reviewed.  Constitutional:       General: She is not in acute distress.    Appearance: Normal appearance. She is well-developed. She is not ill-appearing.  HENT:     Head: Normocephalic and atraumatic.     Right Ear: External ear normal.     Left Ear: External ear normal.  Eyes:     Extraocular Movements: Extraocular movements intact.     Conjunctiva/sclera: Conjunctivae normal.     Pupils: Pupils are equal, round, and reactive to light.  Neck:     Thyroid: No thyromegaly.     Vascular: No carotid bruit or JVD.  Cardiovascular:     Rate and Rhythm: Normal rate and regular rhythm.     Heart sounds: Normal heart sounds. No murmur heard.    No gallop.  Pulmonary:     Effort: Pulmonary effort is normal. No respiratory distress.     Breath sounds: Normal breath sounds. No wheezing or rales.  Chest:     Chest wall: No tenderness.  Musculoskeletal:     Cervical back: Normal range of motion and neck supple.  Skin:    General: Skin is warm and dry.  Neurological:     Mental Status: She is alert and oriented to person, place, and time.  Psychiatric:        Attention and Perception: She is inattentive.        Thought Content: Thought content does not include homicidal or suicidal ideation.        Cognition and Memory: Memory is impaired.        Judgment: Judgment normal.     Comments: PT C/O MEMORY PROBLEMS--pt c/o forgetting things and her family is concerned as well     BP 132/80   Pulse 98   Temp 98.2 F (36.8 C) (Oral)   Resp 16   Ht  (1.6 m)   Wt 168 lb 8 oz (76.4 kg)   SpO2 98%   BMI 29.85 kg/m  Wt Readings from Last 3 Encounters:  02/11/23 168 lb 8 oz (76.4 kg)  10/08/22 170 lb 6.4 oz (77.3 kg)  08/09/22 168 lb 9.6 oz (76.5 kg)       Assessment & Plan:  Tobacco abuse  Type 2 diabetes mellitus with hyperglycemia, without long-term current use of insulin -     Lipid panel -     Comprehensive metabolic panel -     Hemoglobin A1c -     Microalbumin / creatinine urine ratio  Essential  hypertension -     CBC with Differential/Platelet -     Microalbumin / creatinine urine ratio -     hydroCHLOROthiazide; Take 1 tablet (25 mg total) by mouth daily.  Dispense: 100 tablet; Refill: 2 -     Losartan Potassium; Take 1 tablet (100 mg total) by mouth daily.  Dispense: 100 tablet; Refill: 2  Hyperlipidemia associated with type 2 diabetes mellitus -     Lipid panel -     Comprehensive metabolic panel  Hypothyroidism, unspecified type Assessment & Plan: Check labs   Orders: -     TSH  Primary hypertension -     Metoprolol Succinate ER; Take 1 tablet (100 mg  total) by mouth daily. Take with or immediately following a meal.  Dispense: 90 tablet; Refill: 1  Memory loss -     Ambulatory referral to Neurology  Balance disorder Assessment & Plan: Pt is trying to exercise Refer to PT   Orders: -     Ambulatory referral to Physical Therapy    I, Debra Schultz, DO, personally preformed the services described in this documentation.  All medical record entries made by the scribe were at my direction and in my presence.  I have reviewed the chart and discharge instructions (if applicable) and agree that the record reflects my personal performance and is accurate and complete. 02/11/2023   I,Debra [name],acting as a scribe for Debra Schultz, DO.,have documented all relevant documentation on the behalf of Debra Schultz, DO,as directed by  Debra Schultz, DO while in the presence of Debra Schultz, DO.   Debra Schultz, DO

## 2023-02-11 NOTE — Assessment & Plan Note (Signed)
Check labs 

## 2023-02-11 NOTE — Assessment & Plan Note (Signed)
Pt is trying to exercise Refer to PT

## 2023-02-11 NOTE — Assessment & Plan Note (Signed)
Well controlled, no changes to meds. Encouraged heart healthy diet such as the DASH diet and exercise as tolerated.  °

## 2023-02-18 ENCOUNTER — Other Ambulatory Visit: Payer: Self-pay | Admitting: Family Medicine

## 2023-02-18 DIAGNOSIS — D649 Anemia, unspecified: Secondary | ICD-10-CM

## 2023-02-22 ENCOUNTER — Other Ambulatory Visit: Payer: Self-pay | Admitting: Family Medicine

## 2023-02-27 ENCOUNTER — Encounter: Payer: Self-pay | Admitting: Physician Assistant

## 2023-03-06 ENCOUNTER — Ambulatory Visit: Payer: 59 | Attending: Family Medicine | Admitting: Physical Therapy

## 2023-03-06 DIAGNOSIS — R2689 Other abnormalities of gait and mobility: Secondary | ICD-10-CM | POA: Insufficient documentation

## 2023-03-06 DIAGNOSIS — M6281 Muscle weakness (generalized): Secondary | ICD-10-CM | POA: Insufficient documentation

## 2023-03-06 DIAGNOSIS — R279 Unspecified lack of coordination: Secondary | ICD-10-CM | POA: Diagnosis not present

## 2023-03-06 DIAGNOSIS — R293 Abnormal posture: Secondary | ICD-10-CM | POA: Insufficient documentation

## 2023-03-06 DIAGNOSIS — R2681 Unsteadiness on feet: Secondary | ICD-10-CM | POA: Diagnosis not present

## 2023-03-06 NOTE — Therapy (Signed)
OUTPATIENT PHYSICAL THERAPY NEURO EVALUATION   Patient Name: Debra [name] MRN: [id number] DOB:1956/09/27, 67 y.o., female Today's Date: 03/06/2023   PCP: [name], DO REFERRING PROVIDER: [name], DO  END OF SESSION:  PT End of Session - 03/06/23 1323     Visit Number 1    Number of Visits 5    Date for PT Re-Evaluation 04/03/23    Authorization Type [employer] medicare    PT Start Time 1320    PT Stop Time 1400    PT Time Calculation (min) 40 min    Activity Tolerance Patient tolerated treatment well    Behavior During Therapy [facility] for tasks assessed/performed             Past Medical History:  Diagnosis Date   Abdominal pain, right upper quadrant 04/30/2007   Allergies 02/03/2019   Allergy    Anemia, iron deficiency 04/17/2017   Aortic atherosclerosis 10/10/2017   Atypical chest pain 08/31/2014   Back pain with radiculopathy 01/25/2010   Check xray --- ? Cause of leg weakness   Chronic pain of both shoulders 10/31/2020   Constipation    DDD (degenerative disc disease)    Diabetes mellitus type II, uncontrolled 01/11/2020   Diastolic dysfunction 02/15/2010   Epilepsy    Childhood seziures; pt reports that she grew out of them and they have not occurred since childhood   Essential hypertension 08/31/2010   Poorly controlled will alter medications, encouraged DASH diet, minimize caffeine and obtain adequate sleep. Report concerning symptoms and follow up as directed and as needed [employer] metoprolol Edema may be c   Fatigue 01/25/2010   Generalized anxiety disorder 04/22/2018   Stable co'nt meds   GERD (gastroesophageal reflux disease) 07/16/2007   Hearing loss 07/24/2012   Refer to hearing clinic   Hiatal hernia 07/16/2007   Hyperglycemia, fasting 05/04/2010   Hyperlipidemia 04/17/2017   Encouraged heart healthy diet, increase exercise, avoid trans fats, consider a krill oil cap daily   Hypothyroidism 11/02/2010   con't meds; Lab  Results Component Value TSH 1.44 08/12/2017   Insomnia 04/30/2007   Knee pain 07/16/2007   Leg pain, bilateral 11/17/2008   Low back pain 07/16/2007   Major depressive disorder 01/30/2018   con't meds F/u counselor   Mixed connective tissue disease    with Raynaud's   Obstructive sleep apnea 01/30/2018   F/u with pulm; May be cause of some of her memory/concentration problems   Osteoarthritis    Osteoporosis    Palpitations 02/12/2008   Pansinusitis 01/30/2018   Peripheral vertigo 04/30/2007   Resolved rto prn   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   Patient Active Problem List   Diagnosis Date Noted   Type 2 diabetes mellitus with hyperglycemia, without long-term current use of insulin (HCC) 08/09/2022   Snapping lateral band due to ligament laxity 02/19/2022   Moderate episode of recurrent major depressive disorder (HCC) 01/01/2022   Polyuria  01/01/2022   Mixed stress and urge urinary incontinence 01/01/2022   Wound infection 10/04/2021   Wound of right ankle 09/27/2021   Trigger thumb, right thumb 09/13/2021   Pruritic dermatitis 05/25/2021   Vaginal discharge 05/25/2021   Balance disorder 05/25/2021   Dizziness 05/25/2021   Osteoarthritis    Vitamin D deficiency    Urinary frequency 10/31/2020   Chronic pain of both shoulders 10/31/2020   Female pattern baldness 10/06/2020   Myalgia 10/06/2020   Urge incontinence of urine 10/06/2020   History of diabetes mellitus, type II 01/11/2020   Allergies 02/03/2019   Acute vaginitis 04/22/2018   Morbid obesity  04/22/2018   Generalized anxiety disorder 04/22/2018   Obstructive sleep apnea 01/30/2018   Major depressive disorder 01/30/2018   Pansinusitis 01/30/2018   Aortic atherosclerosis 10/10/2017   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   Hyperlipidemia 04/17/2017   Anemia, iron deficiency 04/17/2017   Atypical chest pain 08/31/2014   Arm weakness 08/31/2014   Hearing loss 07/24/2012   Hypothyroidism 11/02/2010   Essential hypertension 08/31/2010   Hyperglycemia, fasting 05/04/2010   Diastolic dysfunction 02/15/2010   Back pain with radiculopathy 01/25/2010   Fatigue 01/25/2010   Tobacco abuse 12/01/2008   Restless legs syndrome 12/01/2008   Leg pain, bilateral 11/17/2008   Palpitations 02/12/2008   GERD (gastroesophageal reflux disease) 07/16/2007   Hiatal hernia 07/16/2007   Knee pain 07/16/2007   Low back pain 07/16/2007   Peripheral vertigo 04/30/2007   Insomnia 04/30/2007   Abdominal pain, right upper quadrant 04/30/2007    ONSET DATE: 02/11/2023 (referral)   REFERRING DIAG: R26.89 (ICD-10-CM) - Balance disorder  THERAPY DIAG:  Unsteadiness on feet  Unspecified lack of coordination  Rationale for Evaluation and Treatment: Rehabilitation  SUBJECTIVE:                                                                                                                                                                                             SUBJECTIVE STATEMENT: Pt presented to clinic alone eating yogurt. Pt reports her balance has "gotten worse" recently. Pt states she feels as though her body starts to lean to one side and she has to old onto something sturdy to stabilize. Gets dizzy occasionally. Mainly happens when she is moving quickly. Last fall was about 37mo ago in which she fell up the stairs while carrying a drink, states her knee "collapsed" on her. Pt works part-time in [employer] and goes to [employer] "when she thinks about it". Has a lot of housework to do that overwhelms her.  Pt very verbal about her depression, has spoken to her doctor about this. Used  to go to [facility] but noticed her balance prevented her from doing a lot of movements, so she no longer goes. Cannot recall which exercises she had difficulty with. Had cataract surgery of L eye and is not longer able to see out of that eye well.   Pt accompanied by: self  PERTINENT HISTORY: HTN, T2DM, GAD, multiple C-spine surgeries  PAIN:  Are you having pain? No  PRECAUTIONS: Fall  WEIGHT BEARING RESTRICTIONS: No  FALLS: Has patient fallen in last 6 months? No  LIVING ENVIRONMENT: Lives with: lives alone Lives in: House/apartment Stairs: Yes: Internal: full flight steps; on right going up and External: 1 steps; none Has following equipment at home: Grab bars and pt reports she has a plethora of medical equipment at home that she does not use   PLOF: Independent  PATIENT GOALS: "being able to get my balance back"  OBJECTIVE:   DIAGNOSTIC FINDINGS: MRI of brain in 2021: IMPRESSION: 1. No acute intracranial abnormality. 2. Moderate chronic white matter disease, progressed from prior MRI. Differential diagnosis include chronic microangiopathic changes, demyelinating disease, and other post inflammatory/infectious processes. 3. Susceptibility artifact from upper cervical spine hardware limits evaluation of the upper cervical spine.  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   COORDINATION: Heel to shin test: WFL bilaterally   EDEMA: Pt reports frequent swelling in distal LEs, (L>R)    POSTURE: rounded shoulders, forward head, and increased thoracic kyphosis   LOWER EXTREMITY MMT:  Tested in seated position   MMT Right Eval Left Eval  Hip flexion 4+ 5  Hip extension    Hip abduction 5 5  Hip  adduction 5 5  Hip internal rotation    Hip external rotation    Knee flexion 4+ 5  Knee extension 5 5  Ankle dorsiflexion 5 5  Ankle plantarflexion    Ankle inversion    Ankle eversion    (Blank rows = not tested)  BED MOBILITY:  Pt reports independence w/activity   TRANSFERS: Assistive device utilized: None  Sit to stand: Modified independence Stand to sit: Modified independence  STAIRS: Level of Assistance: Modified independence Stair Negotiation Technique: Alternating Pattern  with Bilateral Rails Number of Stairs: 4  Height of Stairs: 6"  Comments: Pt performed well w/no instability noted   GAIT: Gait pattern: WFL Distance walked: Various clinic distances  Assistive device utilized: None Level of assistance: Modified independence Comments: Pt ambulated into clinic holding large cup of soda (without lid) and purse. No abnormalities of gait noted on eval but will need to further assess   FUNCTIONAL TESTS:   [facility] PT Assessment - 03/06/23 1347       Transfers   Five time sit to stand comments  12s   no UE support     Ambulation/Gait   Gait velocity 32.8' over 8.94s = 3.66 ft/s no AD      Functional Gait  Assessment   Gait assessed  Yes    Gait Level Surface Walks 20 ft in less than 5.5 sec, no assistive devices, good speed, no evidence for imbalance, normal gait pattern, deviates no more than 6 in outside of the 12 in walkway width.   5.47   Change in Gait Speed Able to smoothly change walking speed without loss of balance or gait deviation. Deviate no more than 6 in  outside of the 12 in walkway width.    Gait with Horizontal Head Turns Performs head turns smoothly with no change in gait. Deviates no more than 6 in outside 12 in walkway width    Gait with Vertical Head Turns Performs head turns with no change in gait. Deviates no more than 6 in outside 12 in walkway width.    Gait and Pivot Turn Pivot turns safely within 3 sec and stops quickly with no loss of  balance.    Step Over Obstacle Is able to step over 2 stacked shoe boxes taped together (9 in total height) without changing gait speed. No evidence of imbalance.    Gait with Narrow Base of Support Ambulates 7-9 steps.    Gait with Eyes Closed Walks 20 ft, no assistive devices, good speed, no evidence of imbalance, normal gait pattern, deviates no more than 6 in outside 12 in walkway width. Ambulates 20 ft in less than 7 sec.   6.06s   Ambulating Backwards Walks 20 ft, slow speed, abnormal gait pattern, evidence for imbalance, deviates 10-15 in outside 12 in walkway width.   20.07s   Steps Alternating feet, must use rail.    Total Score 26    FGA comment: Low fall risk               TODAY'S TREATMENT:         Next Session                                                                                                                           PATIENT EDUCATION: Education details: Eval findings, POC. Person educated: Patient Education method: Medical illustrator Education comprehension: verbalized understanding and needs further education  HOME EXERCISE PROGRAM: To be established   GOALS: Goals reviewed with patient? Yes  STG= LTG DUE TO POC LENGTH  LONG TERM GOALS: Target date: 04/03/2023    Pt will be independent with initial HEP for improved strength, balance, transfers and gait. Baseline: not established on eval Goal status: INITIAL  2.  MCTSIB to be assessed and goal updated Baseline:  Goal status: INITIAL  3.  Reactive balance section of miniBest to be assessed and goal updated  Baseline:  Goal status: INITIAL  4.  to be assessed and goal updated Baseline:  Goal status: INITIAL   ASSESSMENT:  CLINICAL IMPRESSION: Patient is a 67 year old female referred to Neuro OPPT for balance disorder. Pt's PMH is significant for: HTN, T2DM, GAD. The following deficits were present during the exam: impaired short term memory and decreased safety  awareness. Based on pt's fall history, pt is an incr risk for falls. Per the FGA and pt's gait speed, pt is a low fall risk. Will need to further assess pt's 3 balance systems to identify balance impairments and distinguish likely cause of pt's falls. Pt would benefit from skilled PT to address these impairments and functional limitations to maximize functional mobility independence  due to living alone.    OBJECTIVE IMPAIRMENTS: decreased activity tolerance, decreased balance, decreased cognition, decreased knowledge of condition, decreased knowledge of use of DME, difficulty walking, decreased safety awareness, dizziness, and impaired vision/preception.   ACTIVITY LIMITATIONS: lifting, bending, squatting, stairs, and locomotion level  PARTICIPATION LIMITATIONS: shopping, community activity, and occupation  PERSONAL FACTORS: Behavior pattern, Fitness, Past/current experiences, and 1 comorbidity: cognitive decline  are also affecting patient's functional outcome.   REHAB POTENTIAL: Good  CLINICAL DECISION MAKING: Stable/uncomplicated  EVALUATION COMPLEXITY: Low  PLAN:  PT FREQUENCY: 1x/week  PT DURATION: 4 weeks  PLANNED INTERVENTIONS: Therapeutic exercises, Therapeutic activity, Neuromuscular re-education, Balance training, Gait training, Patient/Family education, Self Care, Joint mobilization, Stair training, Vestibular training, Canalith repositioning, Orthotic/Fit training, DME instructions, Aquatic Therapy, Dry Needling, Manual therapy, and Re-evaluation  PLAN FOR NEXT SESSION: Monitor BP. MCTSIB, part of minibest and and update goals. Provide HEP based on impairments. Pt may not need all visits    Debra [name] E Rinoa Garramone, PT, DPT 03/06/2023, 2:01 PM

## 2023-03-12 ENCOUNTER — Ambulatory Visit: Payer: 59 | Admitting: Physical Therapy

## 2023-03-12 ENCOUNTER — Other Ambulatory Visit: Payer: Self-pay | Admitting: Family Medicine

## 2023-03-12 VITALS — BP 114/78 | HR 74

## 2023-03-12 DIAGNOSIS — R2681 Unsteadiness on feet: Secondary | ICD-10-CM

## 2023-03-12 DIAGNOSIS — R2689 Other abnormalities of gait and mobility: Secondary | ICD-10-CM | POA: Diagnosis not present

## 2023-03-12 DIAGNOSIS — R279 Unspecified lack of coordination: Secondary | ICD-10-CM | POA: Diagnosis not present

## 2023-03-12 DIAGNOSIS — M6281 Muscle weakness (generalized): Secondary | ICD-10-CM | POA: Diagnosis not present

## 2023-03-12 DIAGNOSIS — R293 Abnormal posture: Secondary | ICD-10-CM | POA: Diagnosis not present

## 2023-03-12 NOTE — Therapy (Signed)
OUTPATIENT PHYSICAL THERAPY NEURO TREATMENT   Patient Name: Debra [name] MRN: [id number] DOB:10-Oct-1956, 67 y.o., female Today's Date: 03/12/2023   PCP: [name], DO REFERRING PROVIDER: [name], DO  END OF SESSION:  PT End of Session - 03/12/23 1534     Visit Number 2    Number of Visits 5    Date for PT Re-Evaluation 04/03/23    Authorization Type [employer] medicare    PT Start Time 1533    PT Stop Time 1612    PT Time Calculation (min) 39 min    Equipment Utilized During Treatment Gait belt    Activity Tolerance Patient tolerated treatment well    Behavior During Therapy WFL for tasks assessed/performed              Past Medical History:  Diagnosis Date   Abdominal pain, right upper quadrant 04/30/2007   Allergies 02/03/2019   Allergy    Anemia, iron deficiency 04/17/2017   Aortic atherosclerosis 10/10/2017   Atypical chest pain 08/31/2014   Back pain with radiculopathy 01/25/2010   Check xray --- ? Cause of leg weakness   Chronic pain of both shoulders 10/31/2020   Constipation    DDD (degenerative disc disease)    Diabetes mellitus type II, uncontrolled 01/11/2020   Diastolic dysfunction 02/15/2010   Epilepsy    Childhood seziures; pt reports that she grew out of them and they have not occurred since childhood   Essential hypertension 08/31/2010   Poorly controlled will alter medications, encouraged DASH diet, minimize caffeine and obtain adequate sleep. Report concerning symptoms and follow up as directed and as needed [employer] metoprolol Edema may be c   Fatigue 01/25/2010   Generalized anxiety disorder 04/22/2018   Stable co'nt meds   GERD (gastroesophageal reflux disease) 07/16/2007   Hearing loss 07/24/2012   Refer to hearing clinic   Hiatal hernia 07/16/2007   Hyperglycemia, fasting 05/04/2010   Hyperlipidemia 04/17/2017   Encouraged heart healthy diet, increase exercise, avoid trans fats, consider a krill oil cap  daily   Hypothyroidism 11/02/2010   con't meds; Lab Results Component Value TSH 1.44 08/12/2017   Insomnia 04/30/2007   Knee pain 07/16/2007   Leg pain, bilateral 11/17/2008   Low back pain 07/16/2007   Major depressive disorder 01/30/2018   con't meds F/u counselor   Mixed connective tissue disease    with Raynaud's   Obstructive sleep apnea 01/30/2018   F/u with pulm; May be cause of some of her memory/concentration problems   Osteoarthritis    Osteoporosis    Palpitations 02/12/2008   Pansinusitis 01/30/2018   Peripheral vertigo 04/30/2007   Resolved rto prn   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   Patient Active Problem List   Diagnosis Date Noted   Type 2 diabetes mellitus with hyperglycemia, without long-term current use of insulin (HCC) 08/09/2022   Snapping lateral band due to ligament laxity 02/19/2022   Moderate episode  of recurrent major depressive disorder (HCC) 01/01/2022   Polyuria 01/01/2022   Mixed stress and urge urinary incontinence 01/01/2022   Wound infection 10/04/2021   Wound of right ankle 09/27/2021   Trigger thumb, right thumb 09/13/2021   Pruritic dermatitis 05/25/2021   Vaginal discharge 05/25/2021   Balance disorder 05/25/2021   Dizziness 05/25/2021   Osteoarthritis    Vitamin D deficiency    Urinary frequency 10/31/2020   Chronic pain of both shoulders 10/31/2020   Female pattern baldness 10/06/2020   Myalgia 10/06/2020   Urge incontinence of urine 10/06/2020   History of diabetes mellitus, type II 01/11/2020   Allergies  02/03/2019   Acute vaginitis 04/22/2018   Morbid obesity 04/22/2018   Generalized anxiety disorder 04/22/2018   Obstructive sleep apnea 01/30/2018   Major depressive disorder 01/30/2018   Pansinusitis 01/30/2018   Aortic atherosclerosis 10/10/2017   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   Hyperlipidemia 04/17/2017   Anemia, iron deficiency 04/17/2017   Atypical chest pain 08/31/2014   Arm weakness 08/31/2014   Hearing loss 07/24/2012   Hypothyroidism 11/02/2010   Essential hypertension 08/31/2010   Hyperglycemia, fasting 05/04/2010   Diastolic dysfunction 02/15/2010   Back pain with radiculopathy 01/25/2010   Fatigue 01/25/2010   Tobacco abuse 12/01/2008   Restless legs syndrome 12/01/2008   Leg pain, bilateral 11/17/2008   Palpitations 02/12/2008   GERD (gastroesophageal reflux disease) 07/16/2007   Hiatal hernia 07/16/2007   Knee pain 07/16/2007   Low back pain 07/16/2007   Peripheral vertigo 04/30/2007   Insomnia 04/30/2007   Abdominal pain, right upper quadrant 04/30/2007    ONSET DATE: 02/11/2023 (referral)   REFERRING DIAG: R26.89 (ICD-10-CM) - Balance disorder  THERAPY DIAG:  Unsteadiness on feet  Unspecified lack of coordination  Rationale for Evaluation and Treatment: Rehabilitation  SUBJECTIVE:                                                                                                                                                                                             SUBJECTIVE STATEMENT: Pt reports doing okay. Denies falls or near misses since last session. Having some pain in her neck that she did not rate, states this is baseline for her.   Pt accompanied by: self  PERTINENT HISTORY: HTN, T2DM, GAD, multiple C-spine surgeries  PAIN:  Are you having pain? No  PRECAUTIONS: Fall  WEIGHT BEARING RESTRICTIONS: No  FALLS: Has patient fallen in last 6 months? No  LIVING ENVIRONMENT: Lives with: lives alone Lives in: House/apartment Stairs:  Yes: Internal: full flight steps; on right going up and External: 1 steps; none Has following equipment at home: Grab  bars and pt reports she has a plethora of medical equipment at home that she does not use   PLOF: Independent  PATIENT GOALS: "being able to get my balance back"  OBJECTIVE:   DIAGNOSTIC FINDINGS: MRI of brain in 2021: IMPRESSION: 1. No acute intracranial abnormality. 2. Moderate chronic white matter disease, progressed from prior MRI. Differential diagnosis include chronic microangiopathic changes, demyelinating disease, and other post inflammatory/infectious processes. 3. Susceptibility artifact from upper cervical spine hardware limits evaluation of the upper cervical spine.  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   COORDINATION: Heel to shin test: WFL bilaterally   EDEMA: Pt reports frequent swelling in distal LEs, (L>R)    POSTURE: rounded shoulders, forward head, and increased thoracic kyphosis   LOWER EXTREMITY MMT:  Tested in seated position   MMT Right Eval Left Eval  Hip flexion 4+ 5  Hip extension    Hip abduction 5 5  Hip adduction 5 5  Hip internal rotation    Hip external rotation    Knee flexion 4+ 5  Knee extension 5 5  Ankle dorsiflexion 5 5  Ankle plantarflexion    Ankle inversion    Ankle eversion    (Blank rows = not tested)  VITALS Vitals:   03/12/23 1536  BP: 114/78  Pulse: 74      TODAY'S TREATMENT:   Ther Act  MCTSIB: Condition 1: Avg of 3 trials: 30 sec, Condition 2: Avg of 3 trials: 30 sec, Condition 3: Avg of 3 trials: 30 sec, Condition 4: Avg of 3 trials: 30 sec (increased lateral postural sway (L>R)), and Total Score: 120/120   Gait pattern:  genus valgus of RLE and medial heel whip of R foot, step through  pattern, narrow BOS, and poor foot clearance- Right Distance walked: 115' loop completed 14x +110' = 1720'  Assistive device utilized: None Level of assistance: Modified independence and SBA Comments: Pt ambulated w/good cadence but had several incidents of tripping over R foot due to medial heel whip, requiring SBA for safety. RPE of 5/10    GAIT: Gait pattern: [facility] Distance walked: 63' plus various clinic distances  Assistive device utilized: None Level of assistance: Modified independence Comments: trialed use of 1/4" wedge in posterolateral aspect of R shoe to assist w/stabilization of calcaneus and reduction of heel whip. No heel whip noted w/wedge and pt to try this at home to assess if she feels more stable with it. Assessed pt's R ankle DF and pt able to achieve neutral position following posterior talar mobilization, so will add DF exercises to HEP next session.   Ther Ex  In bars for improved vestibular input, ankle stability and single leg stance:  Standing on rockerboard in A/P direction w/EC and no UE support, x90s. No postural sway or instability noted, pt stated this was easy for her.  Alt step ups w/contralateral march on bosu (blue side), x10 reps per side w/intermittent UE support for steadying assist. Pt w/increased difficulty performing on RLE > LLE.    PATIENT EDUCATION: Education details: Outcome measure assessments, plan for next session, trying heel wedge at home, importance of slowing down w/transfers for improved stability  Person educated: Patient Education method: Medical illustrator Education comprehension: verbalized understanding and needs further education  HOME EXERCISE PROGRAM: To be established   GOALS: Goals reviewed  with patient? Yes  STG= LTG DUE TO POC LENGTH  LONG TERM GOALS: Target date: 04/03/2023    Pt will be independent with initial HEP for improved strength, balance, transfers and gait. Baseline: not established on  eval Goal status: INITIAL  2.  MCTSIB to be assessed and goal updated Baseline: 120/120 (discontinued due to high baseline score) Goal status: MET  3.  Reactive balance section of miniBest to be assessed and goal updated  Baseline:  Goal status: INITIAL  4.  Pt will ambulate greater than or equal to 1800 feet on mod I with no LOB for improved cardiovascular endurance and BLE strength.   Baseline: 1720' w/occasional SBA due to tripping over RLE Goal status: REVISED   ASSESSMENT:  CLINICAL IMPRESSION: Emphasis of skilled PT session on assessment of gait, vestibular function and ankle stability. Pt scored a 120/120 on MCTSIB, w/minor postural sway noted on condition 4, indicative of good vestibular input and adequate balance systems. Pt ambulated 1720' on but had several instances of tripping due to medial heel whip of R foot. Added 1/4" wedge to R shoe to assist in stabilization of R heel position and noted decreased heel whip, but pt to trial at home to determine if this improves stability. Continue POC.    OBJECTIVE IMPAIRMENTS: decreased activity tolerance, decreased balance, decreased cognition, decreased knowledge of condition, decreased knowledge of use of DME, difficulty walking, decreased safety awareness, dizziness, and impaired vision/preception.   ACTIVITY LIMITATIONS: lifting, bending, squatting, stairs, and locomotion level  PARTICIPATION LIMITATIONS: shopping, community activity, and occupation  PERSONAL FACTORS: Behavior pattern, Fitness, Past/current experiences, and 1 comorbidity: cognitive decline  are also affecting patient's functional outcome.   REHAB POTENTIAL: Good  CLINICAL DECISION MAKING: Stable/uncomplicated  EVALUATION COMPLEXITY: Low  PLAN:  PT FREQUENCY: 1x/week  PT DURATION: 4 weeks  PLANNED INTERVENTIONS: Therapeutic exercises, Therapeutic activity, Neuromuscular re-education, Balance training, Gait training, Patient/Family education,  Self Care, Joint mobilization, Stair training, Vestibular training, Canalith repositioning, Orthotic/Fit training, DME instructions, Aquatic Therapy, Dry Needling, Manual therapy, and Re-evaluation  PLAN FOR NEXT SESSION: Monitor BP.  part of minibest and update goal. Provide HEP- staggered DL, ankle DF, monster walks, step ups w/glute focus, single leg stability    Romel Dumond E Majour Frei, PT, DPT 03/12/2023, 4:13 PM

## 2023-03-13 ENCOUNTER — Encounter: Payer: Self-pay | Admitting: Physician Assistant

## 2023-03-13 ENCOUNTER — Other Ambulatory Visit (INDEPENDENT_AMBULATORY_CARE_PROVIDER_SITE_OTHER): Payer: 59

## 2023-03-13 ENCOUNTER — Ambulatory Visit: Payer: 59

## 2023-03-13 ENCOUNTER — Ambulatory Visit (INDEPENDENT_AMBULATORY_CARE_PROVIDER_SITE_OTHER): Payer: 59 | Admitting: Physician Assistant

## 2023-03-13 VITALS — BP 114/68 | HR 74 | Resp 18 | Ht 63.0 in | Wt 163.0 lb

## 2023-03-13 DIAGNOSIS — E611 Iron deficiency: Secondary | ICD-10-CM | POA: Diagnosis not present

## 2023-03-13 DIAGNOSIS — R413 Other amnesia: Secondary | ICD-10-CM

## 2023-03-13 LAB — TSH: TSH: 1.5 u[IU]/mL (ref 0.35–5.50)

## 2023-03-13 LAB — VITAMIN B12: Vitamin B-12: 400 pg/mL (ref 211–911)

## 2023-03-13 LAB — FOLATE: Folate: >23.9 ng/mL (ref >5.9)

## 2023-03-13 LAB — FERRITIN: Ferritin: 166 ng/mL (ref 10.0–291.0)

## 2023-03-13 NOTE — Patient Instructions (Addendum)
It was a pleasure to see you today at our office.   Recommendations:  MRI of the brain, the radiology office will call you to arrange you appointment   Check labs today   Follow up in 1 month.    For assessment of decision of mental capacity and competency:  Call Dr. Erick Blinks, geriatric psychiatrist at [phone] Counseling regarding caregiver distress, including caregiver depression, anxiety and issues regarding community resources, adult day care programs, adult living facilities, or memory care questions:  please contact your  Primary Doctor's Social Worker  Whom to call: Memory  decline, memory medications: Call our office [phone]  For psychiatric meds, mood meds: Please have your primary care physician manage these medications.  If you have any severe symptoms of a stroke, or other severe issues such as confusion,severe chills or fever, etc call 911 or go to the ER as you may need to be evaluated further    RECOMMENDATIONS FOR ALL PATIENTS WITH MEMORY PROBLEMS: 1. Continue to exercise (Recommend 30 minutes of walking everyday, or 3 hours every week) 2. Increase social interactions - continue going to [place] and enjoy social gatherings with friends and family 3. Eat healthy, avoid fried foods and eat more fruits and vegetables 4. Maintain adequate blood pressure, blood sugar, and blood cholesterol level. Reducing the risk of stroke and cardiovascular disease also helps promoting better memory. 5. Avoid stressful situations. Live a simple life and avoid aggravations. Organize your time and prepare for the next day in anticipation. 6. Sleep well, avoid any interruptions of sleep and avoid any distractions in the bedroom that may interfere with adequate sleep quality 7. Avoid sugar, avoid sweets as there is a strong link between excessive sugar intake, diabetes, and cognitive impairment We discussed the Mediterranean diet, which has been shown to help patients reduce the risk  of progressive memory disorders and reduces cardiovascular risk. This includes eating fish, eat fruits and green leafy vegetables, nuts like almonds and hazelnuts, walnuts, and also use olive oil. Avoid fast foods and fried foods as much as possible. Avoid sweets and sugar as sugar use has been linked to worsening of memory function.  There is always a concern of gradual progression of memory problems. If this is the case, then we may need to adjust level of care according to patient needs. Support, both to the patient and caregiver, should then be put into place.       FALL PRECAUTIONS: Be cautious when walking. Scan the area for obstacles that may increase the risk of trips and falls. When getting up in the mornings, sit up at the edge of the bed for a few minutes before getting out of bed. Consider elevating the bed at the head end to avoid drop of blood pressure when getting up. Walk always in a well-lit room (use night lights in the walls). Avoid area rugs or power cords from appliances in the middle of the walkways. Use a walker or a cane if necessary and consider physical therapy for balance exercise. Get your eyesight checked regularly.  FINANCIAL OVERSIGHT: Supervision, especially oversight when making financial decisions or transactions is also recommended.  HOME SAFETY: Consider the safety of the kitchen when operating appliances like stoves, microwave oven, and blender. Consider having supervision and share cooking responsibilities until no longer able to participate in those. Accidents with firearms and other hazards in the house should be identified and addressed as well.   ABILITY TO BE LEFT ALONE: If patient is  unable to contact 911 operator, consider using LifeLine, or when the need is there, arrange for someone to stay with patients. Smoking is a fire hazard, consider supervision or cessation. Risk of wandering should be assessed by caregiver and if detected at any point, supervision  and safe proof recommendations should be instituted.  MEDICATION SUPERVISION: Inability to self-administer medication needs to be constantly addressed. Implement a mechanism to ensure safe administration of the medications.   DRIVING: Regarding driving, in patients with progressive memory problems, driving will be impaired. We advise to have someone else do the driving if trouble finding directions or if minor accidents are reported. Independent driving assessment is available to determine safety of driving.   If you are interested in the driving assessment, you can contact the following:  [facility] [employer] [facility] in [place] [phone]  [facility] [phone]  [facility] [phone] or [phone]    Mediterranean Diet A Mediterranean diet refers to food and lifestyle choices that are based on the traditions of countries located on the [employer]. This way of eating has been shown to help prevent certain conditions and improve outcomes for people who have chronic diseases, like kidney disease and heart disease. What are tips for following this plan? Lifestyle  Cook and eat meals together with your family, when possible. Drink enough fluid to keep your urine clear or pale yellow. Be physically active every day. This includes: Aerobic exercise like running or swimming. Leisure activities like gardening, walking, or housework. Get 7-8 hours of sleep each night. If recommended by your health care provider, drink red wine in moderation. This means 1 glass a day for nonpregnant women and 2 glasses a day for men. A glass of wine equals 5 oz (150 mL). Reading food labels  Check the serving size of packaged foods. For foods such as rice and pasta, the serving size refers to the amount of cooked product, not dry. Check the total fat in packaged foods. Avoid foods that have saturated fat or trans fats. Check the  ingredients list for added sugars, such as corn syrup. Shopping  At the grocery store, buy most of your food from the areas near the walls of the store. This includes: Fresh fruits and vegetables (produce). Grains, beans, nuts, and seeds. Some of these may be available in unpackaged forms or large amounts (in bulk). Fresh seafood. Poultry and eggs. Low-fat dairy products. Buy whole ingredients instead of prepackaged foods. Buy fresh fruits and vegetables in-season from local farmers markets. Buy frozen fruits and vegetables in resealable bags. If you do not have access to quality fresh seafood, buy precooked frozen shrimp or canned fish, such as tuna, salmon, or sardines. Buy small amounts of raw or cooked vegetables, salads, or olives from the deli or salad bar at your store. Stock your pantry so you always have certain foods on hand, such as olive oil, canned tuna, canned tomatoes, rice, pasta, and beans. Cooking  Cook foods with extra-virgin olive oil instead of using butter or other vegetable oils. Have meat as a side dish, and have vegetables or grains as your main dish. This means having meat in small portions or adding small amounts of meat to foods like pasta or stew. Use beans or vegetables instead of meat in common dishes like chili or lasagna. Experiment with different cooking methods. Try roasting or broiling vegetables instead of steaming or sauteing them. Add frozen vegetables to soups, stews, pasta, or rice. Add nuts  or seeds for added healthy fat at each meal. You can add these to yogurt, salads, or vegetable dishes. Marinate fish or vegetables using olive oil, lemon juice, garlic, and fresh herbs. Meal planning  Plan to eat 1 vegetarian meal one day each week. Try to work up to 2 vegetarian meals, if possible. Eat seafood 2 or more times a week. Have healthy snacks readily available, such as: Vegetable sticks with hummus. Greek yogurt. Fruit and nut trail mix. Eat  balanced meals throughout the week. This includes: Fruit: 2-3 servings a day Vegetables: 4-5 servings a day Low-fat dairy: 2 servings a day Fish, poultry, or lean meat: 1 serving a day Beans and legumes: 2 or more servings a week Nuts and seeds: 1-2 servings a day Whole grains: 6-8 servings a day Extra-virgin olive oil: 3-4 servings a day Limit red meat and sweets to only a few servings a month What are my food choices? Mediterranean diet Recommended Grains: Whole-grain pasta. Brown rice. Bulgar wheat. Polenta. Couscous. Whole-wheat bread. Orpah Cobb. Vegetables: Artichokes. Beets. Broccoli. Cabbage. Carrots. Eggplant. Green beans. Chard. Kale. Spinach. Onions. Leeks. Peas. Squash. Tomatoes. Peppers. Radishes. Fruits: Apples. Apricots. Avocado. Berries. Bananas. Cherries. Dates. Figs. Grapes. Lemons. Melon. Oranges. Peaches. Plums. Pomegranate. Meats and other protein foods: Beans. Almonds. Sunflower seeds. Pine nuts. Peanuts. Cod. Salmon. Scallops. Shrimp. Tuna. Tilapia. Clams. Oysters. Eggs. Dairy: Low-fat milk. Cheese. Greek yogurt. Beverages: Water. Red wine. Herbal tea. Fats and oils: Extra virgin olive oil. Avocado oil. Grape seed oil. Sweets and desserts: [place] yogurt with honey. Baked apples. Poached pears. Trail mix. Seasoning and other foods: Basil. Cilantro. Coriander. Cumin. Mint. Parsley. Sage. Rosemary. Tarragon. Garlic. Oregano. Thyme. Pepper. Balsalmic vinegar. Tahini. Hummus. Tomato sauce. Olives. Mushrooms. Limit these Grains: Prepackaged pasta or rice dishes. Prepackaged cereal with added sugar. Vegetables: Deep fried potatoes (french fries). Fruits: Fruit canned in syrup. Meats and other protein foods: Beef. Pork. Lamb. Poultry with skin. Hot dogs. Tomasa Blase. Dairy: Ice cream. Sour cream. Whole milk. Beverages: Juice. Sugar-sweetened soft drinks. Beer. Liquor and spirits. Fats and oils: Butter. Canola oil. Vegetable oil. Beef fat (tallow). Lard. Sweets and desserts:  Cookies. Cakes. Pies. Candy. Seasoning and other foods: Mayonnaise. Premade sauces and marinades. The items listed may not be a complete list. Talk with your dietitian about what dietary choices are right for you. Summary The Mediterranean diet includes both food and lifestyle choices. Eat a variety of fresh fruits and vegetables, beans, nuts, seeds, and whole grains. Limit the amount of red meat and sweets that you eat. Talk with your health care provider about whether it is safe for you to drink red wine in moderation. This means 1 glass a day for nonpregnant women and 2 glasses a day for men. A glass of wine equals 5 oz (150 mL). This information is not intended to replace advice given to you by your health care provider. Make sure you discuss any questions you have with your health care provider. Document Released: 06/06/2016 Document Revised: 07/09/2016 Document Reviewed: 06/06/2016 Elsevier Interactive Patient Education  2017 [employer].

## 2023-03-13 NOTE — Progress Notes (Signed)
Assessment/Plan:   Debra [name] is a very pleasant 67 y.o. year old RH female with extensive medical history including iron deficiency anemia, chronic back pain, GAD, MDD, hearing loss, hypertension, hyperlipidemia, RLS, GERD, DM2, vitamin D deficiency, hypothyroidism, seen today for evaluation of memory loss. MoCA today is 22/30.  Patient demonstrated significant difficulty during testing, and at times was frustrated with some of the items.    Memory Impairment, likely of multiple etiologies  MRI brain without contrast to assess for underlying structural abnormality and assess vascular load  Check anemia panel for iron deficiency anemia Resume the CPAP for OSA.  Continue to control mood as per PCP Recommend resuming Counseling for MDD and GAD. Continue physical therapy for chronic back pain Recommend good control of cardiovascular risk factors Tobacco cessation counseled. Recommend checking with audiologist for decreased hearing  Folllow up in 1 month   Subjective:    The patient is here alone    How long did patient have memory difficulties?  "For several years, worse over the last year ".Patient has some difficulty remembering recent conversations and people names. It is worse after Covid 2 years ago."  "I was tested in the past for ADD, may have a slight but not a lot" repeats oneself?  Denies repeating.  "It' s me the one that is asking them to repeat ".  Disoriented when walking into a room?  Patient denies except occasionally not remembering what patient came to the room for.    Leaving objects in unusual places?  "I tried to get rid of stuff.  I have been trying to declog my house ". Wandering behavior?  denies   Any personality changes since last visit?  Patient denies   Any history of depression?:  Endorsed, she has a history of depression and anxiety, followed a counselor "but have not seen them in a while ". Hallucinations or paranoia?  P "I have seen something  run across but turn my head and there is no one, not often ".   Seizures?   "I had epilepsy as a child "  Any sleep changes?  She has insomnia . Does not sleep well, wakes up during the night, yesterday she slept 3 hrs. Takes Requip that helps.  Sometimes she has vivid dreams, denies  REM behavior or sleepwalking   Sleep apnea? Endorsed, years ago I got one, but I stopped using it for years "the CPAP is sitting out there".    Any hygiene concerns?  Patient denies   Independent of bathing and dressing?  Endorsed  Does the patient needs help with medications?  Patient is in charge, misses doses   Who is in charge of the finances?  Patient is in charge, has missed bills in the past      Any changes in appetite?  I am on something for my DM, Ozempic, to lose weight . Not drinking enough water  Patient have trouble swallowing? denies   Does the patient cook?  Yes. Forgets common recipes  Any kitchen accidents such as leaving the stove on? denies   Any headaches?   denies   Chronic back pain ? Endorsed, takes pain meds "did physical therapy for a while ", "It was rough" Ambulates with difficulty?  Chronic, Sometimes I lose the strength in my legs.  Recent falls or head injuries? Endorsed, sometimes my legs are weaker. I am to attend PT for strength Vision changes?  She had recent cataract surgery "it did not  help much " Unilateral weakness, numbness or tingling? denies   Any tremors?   denies   Any anosmia?  denies   Any incontinence of urine?  She has a history of urge incontinence. Wears pads  Any bowel dysfunction?  Some constipation    Patient lives alone  History of heavy alcohol intake?  Denies  history of heavy tobacco use?  Trying to quit, uses nicotine patch. Was doing 1/2 ppd but stopped 4 weeks ago  Family history of dementia? Aunt has dementia unknown type Does patient drive? Endorsed, sometimes I have to use my GPS.  Security at [employer]   Past Medical History:  Diagnosis Date    Abdominal pain, right upper quadrant 04/30/2007   Allergies 02/03/2019   Allergy    Anemia, iron deficiency 04/17/2017   Aortic atherosclerosis 10/10/2017   Atypical chest pain 08/31/2014   Back pain with radiculopathy 01/25/2010   Check xray --- ? Cause of leg weakness   Chronic pain of both shoulders 10/31/2020   Constipation    DDD (degenerative disc disease)    Diabetes mellitus type II, uncontrolled 01/11/2020   Diastolic dysfunction 02/15/2010   Epilepsy    Childhood seziures; pt reports that she grew out of them and they have not occurred since childhood   Essential hypertension 08/31/2010   Poorly controlled will alter medications, encouraged DASH diet, minimize caffeine and obtain adequate sleep. Report concerning symptoms and follow up as directed and as needed [employer] metoprolol Edema may be c   Fatigue 01/25/2010   Generalized anxiety disorder 04/22/2018   Stable co'nt meds   GERD (gastroesophageal reflux disease) 07/16/2007   Hearing loss 07/24/2012   Refer to hearing clinic   Hiatal hernia 07/16/2007   Hyperglycemia, fasting 05/04/2010   Hyperlipidemia 04/17/2017   Encouraged heart healthy diet, increase exercise, avoid trans fats, consider a krill oil cap daily   Hypothyroidism 11/02/2010   con't meds; Lab Results Component Value TSH 1.44 08/12/2017   Insomnia 04/30/2007   Knee pain 07/16/2007   Leg pain, bilateral 11/17/2008   Low back pain 07/16/2007   Major depressive disorder 01/30/2018   con't meds F/u counselor   Mixed connective tissue disease    with Raynaud's   Obstructive sleep apnea 01/30/2018   F/u with pulm; May be cause of some of her memory/concentration problems   Osteoarthritis    Osteoporosis    Palpitations 02/12/2008   Pansinusitis 01/30/2018   Peripheral vertigo 04/30/2007   Resolved rto prn   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     Allergies  Allergen Reactions   Penicillins    Codeine Rash and Other (See Comments)    dizziness    Current Outpatient Medications  Medication Instructions   Accu-Chek Softclix Lancets lancets Use to check blood glucose once a day (DX: type 2 DM E11.65)   azelastine (ASTELIN) 0.1 % nasal spray 2 sprays, Each Nare, 2 times daily, Use in each nostril  as directed   Blood Glucose Monitoring Suppl (ACCU-CHEK GUIDE) w/Device KIT Use to check blood glucose daily (DX: type 2 DM E 11.65)   gabapentin (NEURONTIN) 600 mg, Oral, 3 times daily   glucose blood (ACCU-CHEK GUIDE) test strip Use to check blood glucose once a day (Dx: type 2 DM / E11.65)   hydrochlorothiazide (HYDRODIURIL) 25 mg, Oral, Daily   levothyroxine (SYNTHROID) 50 MCG tablet TAKE 1 TABLET BY MOUTH DAILY   losartan (COZAAR) 100 mg, Oral, Daily   magic mouthwash w/lidocaine SOLN 5 mLs, Oral, 4 times daily PRN, Swish and Spit   metFORMIN (GLUCOPHAGE-XR) 1,000 mg, Oral, Daily, With largest meal.   metoprolol succinate (TOPROL-XL) 100 mg, Oral, Daily, Take with or immediately following a meal.   montelukast (SINGULAIR) 10 mg, Oral, Daily at bedtime   nicotine (NICODERM CQ - DOSED IN MG/24 HOURS) 14 mg, Transdermal, Daily, Remove and apply new patch each morning.   NON FORMULARY [facility] <BR>Scar Cream - <BR>Verapamil 10%, Pentoxifylline 5%<BR>Apply 1-2 grams to affected area 3-4 times daily<BR>Qty. 120 gm<BR>3 refills    oxybutynin (DITROPAN-XL) 10 mg, Oral, Daily at bedtime   Ozempic (1 MG/DOSE) 1 mg, Subcutaneous, Weekly   PARoxetine (PAXIL) 40 mg, Oral, Every morning   pravastatin (PRAVACHOL)  40 MG tablet TAKE 1 TABLET BY MOUTH ONCE DAILY FOR LOWERING CHOLESTEROL AND HEART HEALTH     VITALS:   Vitals:   03/13/23 0745 03/13/23 0908  BP: (!) 144/69 114/68  Pulse: 74   Resp: 18   SpO2: 96%   Weight: 163 lb (73.9 kg)   Height: 5\' 3"  (1.6 m)     PHYSICAL EXAM   HEENT:  Normocephalic, atraumatic. The mucous membranes are moist. The superficial temporal arteries are without ropiness or tenderness. Cardiovascular: Regular rate and rhythm. Lungs: Clear to auscultation bilaterally. Neck: There are no carotid bruits noted bilaterally.  NEUROLOGICAL:    03/13/2023   12:00 PM  Montreal Cognitive Assessment   Visuospatial/ Executive (0/5) 4  Naming (0/3) 3  Attention: Read list of digits (0/2) 2  Attention: Read list of letters (0/1) 1  Attention: Serial 7 subtraction starting at 100 (0/3) 1  Language: Repeat phrase (0/2) 1  Language : Fluency (0/1) 0  Abstraction (0/2) 1  Delayed Recall (0/5) 3  Orientation (0/6) 6  Total 22  Adjusted Score (based on education) 22        No data to display           Orientation:  Alert and oriented to person, place and time. No aphasia or dysarthria. Fund of knowledge is appropriate. Recent memory impaired and remote memory intact.  Attention and concentration are reduced.  Able to name objects and repeat phrases 2/2. Delayed recall 3/5 cranial nerves: There is good facial symmetry. Extraocular muscles are intact and visual fields are full to confrontational testing. Speech is fluent and clear. No tongue deviation. Hearing is intact to conversational tone. Tone: Tone is good throughout. Sensation: Sensation is intact to light touch and pinprick throughout. Vibration is intact at the bilateral big toe.  Coordination: The patient has no difficulty with RAM's or FNF bilaterally. Normal finger to nose  Motor: Strength is 5/5 in the bilateral upper and lower extremities. There is no pronator drift. There are no fasciculations  noted. DTR's: Deep tendon reflexes are 2/4 at the bilateral biceps, triceps, brachioradialis, patella and achilles.  Plantar responses are downgoing bilaterally. Gait and Station: The patient is able to ambulate without difficulty.The patient is  able to heel toe walk without any difficulty.The patient is able to ambulate in a tandem fashion. The patient is able to stand in the Romberg position.     Thank you for allowing [place] the opportunity to participate in the care of this nice patient. Please do not hesitate to contact us for any questions or concerns.   Total time spent on today's visit was 60 minutes dedicated to this patient today, preparing to see patient, examining the patient, ordering tests and/or medications and counseling the patient, documenting clinical information in the EHR or other health record, independently interpreting results and communicating results to the patient/family, discussing treatment and goals, answering patient's questions and coordinating care.  Cc:  Fredric Dine [facility] 03/13/2023 12:32 PM

## 2023-03-14 LAB — IRON AND TIBC
Iron Saturation: 14 % — ABNORMAL LOW (ref 15–55)
Iron: 41 ug/dL (ref 27–139)
Total Iron Binding Capacity: 285 ug/dL (ref 250–450)
UIBC: 244 ug/dL (ref 118–369)

## 2023-03-14 NOTE — Progress Notes (Signed)
Iron saturation is low, recommend iron supplements daily and follow with primary doctor.  B12 is on the lower normal, recommend 1 tab 1000 micrograms daily, The other labs are normal. Thanks

## 2023-03-19 ENCOUNTER — Ambulatory Visit: Payer: 59 | Admitting: Physical Therapy

## 2023-03-19 VITALS — BP 114/75 | HR 87

## 2023-03-19 DIAGNOSIS — R2689 Other abnormalities of gait and mobility: Secondary | ICD-10-CM | POA: Diagnosis not present

## 2023-03-19 DIAGNOSIS — R293 Abnormal posture: Secondary | ICD-10-CM

## 2023-03-19 DIAGNOSIS — R279 Unspecified lack of coordination: Secondary | ICD-10-CM

## 2023-03-19 DIAGNOSIS — R2681 Unsteadiness on feet: Secondary | ICD-10-CM

## 2023-03-19 DIAGNOSIS — M6281 Muscle weakness (generalized): Secondary | ICD-10-CM | POA: Diagnosis not present

## 2023-03-19 NOTE — Therapy (Signed)
OUTPATIENT PHYSICAL THERAPY NEURO TREATMENT   Patient Name: Debra [name] MRN: [id number] DOB:24-Mar-1956, 67 y.o., female Today's Date: 03/19/2023   PCP: [name], DO REFERRING PROVIDER: [name], DO  END OF SESSION:  PT End of Session - 03/19/23 1534     Visit Number 3    Number of Visits 5    Date for PT Re-Evaluation 04/03/23    Authorization Type [employer] medicare    PT Start Time 1533   pt arrived late   PT Stop Time 1619    PT Time Calculation (min) 46 min    Equipment Utilized During Treatment Gait belt    Activity Tolerance Patient tolerated treatment well    Behavior During Therapy [facility] for tasks assessed/performed               Past Medical History:  Diagnosis Date   Abdominal pain, right upper quadrant 04/30/2007   Allergies 02/03/2019   Allergy    Anemia, iron deficiency 04/17/2017   Aortic atherosclerosis 10/10/2017   Atypical chest pain 08/31/2014   Back pain with radiculopathy 01/25/2010   Check xray --- ? Cause of leg weakness   Chronic pain of both shoulders 10/31/2020   Constipation    DDD (degenerative disc disease)    Diabetes mellitus type II, uncontrolled 01/11/2020   Diastolic dysfunction 02/15/2010   Epilepsy    Childhood seziures; pt reports that she grew out of them and they have not occurred since childhood   Essential hypertension 08/31/2010   Poorly controlled will alter medications, encouraged DASH diet, minimize caffeine and obtain adequate sleep. Report concerning symptoms and follow up as directed and as needed [employer] metoprolol Edema may be c   Fatigue 01/25/2010   Generalized anxiety disorder 04/22/2018   Stable co'nt meds   GERD (gastroesophageal reflux disease) 07/16/2007   Hearing loss 07/24/2012   Refer to hearing clinic   Hiatal hernia 07/16/2007   Hyperglycemia, fasting 05/04/2010   Hyperlipidemia 04/17/2017   Encouraged heart healthy diet, increase exercise, avoid trans fats, consider  a krill oil cap daily   Hypothyroidism 11/02/2010   con't meds; Lab Results Component Value TSH 1.44 08/12/2017   Insomnia 04/30/2007   Knee pain 07/16/2007   Leg pain, bilateral 11/17/2008   Low back pain 07/16/2007   Major depressive disorder 01/30/2018   con't meds F/u counselor   Mixed connective tissue disease    with Raynaud's   Obstructive sleep apnea 01/30/2018   F/u with pulm; May be cause of some of her memory/concentration problems   Osteoarthritis    Osteoporosis    Palpitations 02/12/2008   Pansinusitis 01/30/2018   Peripheral vertigo 04/30/2007   Resolved rto prn   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   Patient Active Problem List   Diagnosis Date Noted   Type 2 diabetes mellitus with hyperglycemia, without long-term current use of insulin (HCC) 08/09/2022   Snapping lateral band due to ligament laxity  02/19/2022   Moderate episode of recurrent major depressive disorder (HCC) 01/01/2022   Polyuria 01/01/2022   Mixed stress and urge urinary incontinence 01/01/2022   Wound infection 10/04/2021   Wound of right ankle 09/27/2021   Trigger thumb, right thumb 09/13/2021   Pruritic dermatitis 05/25/2021   Vaginal discharge 05/25/2021   Balance disorder 05/25/2021   Dizziness 05/25/2021   Osteoarthritis    Vitamin D deficiency    Urinary frequency 10/31/2020   Chronic pain of both shoulders 10/31/2020   Female pattern baldness 10/06/2020   Myalgia 10/06/2020   Urge incontinence of urine 10/06/2020   History of diabetes mellitus, type II 01/11/2020    Allergies 02/03/2019   Acute vaginitis 04/22/2018   Morbid obesity 04/22/2018   Generalized anxiety disorder 04/22/2018   Obstructive sleep apnea 01/30/2018   Major depressive disorder 01/30/2018   Pansinusitis 01/30/2018   Aortic atherosclerosis 10/10/2017   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   Hyperlipidemia 04/17/2017   Anemia, iron deficiency 04/17/2017   Atypical chest pain 08/31/2014   Arm weakness 08/31/2014   Hearing loss 07/24/2012   Hypothyroidism 11/02/2010   Essential hypertension 08/31/2010   Hyperglycemia, fasting 05/04/2010   Diastolic dysfunction 02/15/2010   Back pain with radiculopathy 01/25/2010   Fatigue 01/25/2010   Tobacco abuse 12/01/2008   Restless legs syndrome 12/01/2008   Leg pain, bilateral 11/17/2008   Palpitations 02/12/2008   GERD (gastroesophageal reflux disease) 07/16/2007   Hiatal hernia 07/16/2007   Knee pain 07/16/2007   Low back pain 07/16/2007   Peripheral vertigo 04/30/2007   Insomnia 04/30/2007   Abdominal pain, right upper quadrant 04/30/2007    ONSET DATE: 02/11/2023 (referral)   REFERRING DIAG: R26.89 (ICD-10-CM) - Balance disorder  THERAPY DIAG:  Unsteadiness on feet  Unspecified lack of coordination  Muscle weakness (generalized)  Abnormal posture  Rationale for Evaluation and Treatment: Rehabilitation  SUBJECTIVE:                                                                                                                                                                                             SUBJECTIVE STATEMENT: Pt reports she feels like her balance has been "awkward" and that she is "tilting", usually more towards the L side. No real complaints of pain. No recent falls. Pt reports that occasionally her legs will just randomly go out on her. Pt feels like the wedge in her tennis shoe was helpful but forgot to wear tennis shoes today, wearing Birkenstocks.   Pt accompanied by: self  PERTINENT HISTORY: HTN, T2DM,  GAD, multiple C-spine surgeries  PAIN:  Are you having pain? No  PRECAUTIONS: Fall  WEIGHT BEARING RESTRICTIONS: No  FALLS: Has patient fallen in last 6 months? No  LIVING ENVIRONMENT: Lives with: lives alone Lives in: House/apartment Stairs: Yes: Internal: full flight steps; on right going up and External: 1 steps; none Has following equipment at home: Grab bars and pt reports she has a plethora of medical equipment at home that she does not use   PLOF: Independent  PATIENT GOALS: "being able to get my balance back"  OBJECTIVE:   DIAGNOSTIC FINDINGS: MRI of brain in 2021: IMPRESSION: 1. No acute intracranial abnormality. 2. Moderate chronic white matter disease, progressed from prior MRI. Differential diagnosis include chronic microangiopathic changes, demyelinating disease, and other post inflammatory/infectious processes. 3. Susceptibility artifact from upper cervical spine hardware limits evaluation of the upper cervical spine.  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   COORDINATION: Heel to shin test: WFL bilaterally   EDEMA: Pt reports frequent swelling in distal LEs, (L>R)    POSTURE: rounded shoulders, forward head, and increased thoracic kyphosis   LOWER EXTREMITY MMT:  Tested in seated position   MMT Right Eval Left Eval  Hip flexion 4+ 5  Hip extension    Hip abduction 5 5  Hip adduction 5 5  Hip internal rotation    Hip external rotation    Knee flexion 4+ 5  Knee extension 5 5  Ankle dorsiflexion 5 5  Ankle plantarflexion    Ankle inversion    Ankle eversion    (Blank rows = not tested)  VITALS Vitals:   03/19/23 1540  BP: 114/75  Pulse: 87       TODAY'S TREATMENT:   TherAct:  Attempted staggered dead lifts with 10# KB, pt with difficulty  performing due to HS tightness  In // bars to work on glute strengthening: Alt L/R 4" forwards step ups with progression from BUE support to no UE support 2 x 10 reps each Alt L/R lateral 4" step ups with no UE support 2 x 10 reps each  In // bars to work on dynamic and static standing balance with no UE support: Resisted monster walks 4 x 10 ft with RTB SLS 4 x 30 sec each with intermittent fingertip support  Added to HEP, see bolded below    PATIENT EDUCATION: Education details: initiated HEP Person educated: Patient Education method: Programmer, multimedia, Facilities manager, and Handouts Education comprehension: verbalized understanding and needs further education  HOME EXERCISE PROGRAM: Access Code: [email or web address] URL: [email or web address] Date: 03/19/2023 Prepared by: Peter Congo  Exercises - Forward Monster Walk with Resistance at Ankles and Counter Support  - 1 x daily - 7 x weekly - 3 sets - 10 reps - Step Up  - 1 x daily - 7 x weekly - 3 sets - 10 reps - Lateral Step Up  - 1 x daily - 7 x weekly - 3 sets - 10 reps - Single Leg Stance  - 1 x daily - 7 x weekly - 1 sets - 5 reps - 30 sec hold  GOALS: Goals reviewed with patient? Yes  STG= LTG DUE TO POC LENGTH  LONG TERM GOALS: Target date: 04/03/2023  Pt will be independent with initial HEP for improved strength, balance, transfers and gait. Baseline: not established on eval Goal status: INITIAL  2.  MCTSIB to be assessed and goal updated Baseline: 120/120 (discontinued due to high  baseline score) Goal status: MET  3.  Reactive balance section of miniBest to be assessed and goal updated  Baseline:  Goal status: INITIAL  4.  Pt will ambulate greater than or equal to 1800 feet on mod I with no LOB for improved cardiovascular endurance and BLE strength.   Baseline: 1720' w/occasional SBA due to tripping over RLE Goal status: REVISED   ASSESSMENT:  CLINICAL IMPRESSION: Emphasis of skilled PT session on  initiating HEP for dynamic and standing balance as well as LE strengthening. Pt exhibits most difficulty with eccentric control with step-ups/downs and with SLS stability. With cues to decrease speed and perform tasks in a slow, controlled manner pt does exhibit improved balance. Pt continues to benefit from skilled therapy services to work on improving her balance and in order to decrease her fall risk. Continue POC.    OBJECTIVE IMPAIRMENTS: decreased activity tolerance, decreased balance, decreased cognition, decreased knowledge of condition, decreased knowledge of use of DME, difficulty walking, decreased safety awareness, dizziness, and impaired vision/preception.   ACTIVITY LIMITATIONS: lifting, bending, squatting, stairs, and locomotion level  PARTICIPATION LIMITATIONS: shopping, community activity, and occupation  PERSONAL FACTORS: Behavior pattern, Fitness, Past/current experiences, and 1 comorbidity: cognitive decline  are also affecting patient's functional outcome.   REHAB POTENTIAL: Good  CLINICAL DECISION MAKING: Stable/uncomplicated  EVALUATION COMPLEXITY: Low  PLAN:  PT FREQUENCY: 1x/week  PT DURATION: 4 weeks  PLANNED INTERVENTIONS: Therapeutic exercises, Therapeutic activity, Neuromuscular re-education, Balance training, Gait training, Patient/Family education, Self Care, Joint mobilization, Stair training, Vestibular training, Canalith repositioning, Orthotic/Fit training, DME instructions, Aquatic Therapy, Dry Needling, Manual therapy, and Re-evaluation  PLAN FOR NEXT SESSION: Monitor BP.  part of minibest and update goal. How is HEP? Add to HEP:- staggered DL, ankle DF   Peter Congo, PT, DPT, CSRS 03/19/2023, 4:19 PM

## 2023-03-20 ENCOUNTER — Other Ambulatory Visit: Payer: Self-pay | Admitting: Family Medicine

## 2023-03-22 ENCOUNTER — Other Ambulatory Visit: Payer: Self-pay | Admitting: Family Medicine

## 2023-04-02 ENCOUNTER — Ambulatory Visit: Payer: 59 | Attending: Family Medicine | Admitting: Physical Therapy

## 2023-04-02 VITALS — BP 107/72 | HR 85

## 2023-04-02 DIAGNOSIS — R279 Unspecified lack of coordination: Secondary | ICD-10-CM | POA: Diagnosis not present

## 2023-04-02 DIAGNOSIS — R2681 Unsteadiness on feet: Secondary | ICD-10-CM | POA: Diagnosis not present

## 2023-04-02 DIAGNOSIS — R41841 Cognitive communication deficit: Secondary | ICD-10-CM | POA: Insufficient documentation

## 2023-04-02 DIAGNOSIS — M6281 Muscle weakness (generalized): Secondary | ICD-10-CM | POA: Insufficient documentation

## 2023-04-02 NOTE — Therapy (Signed)
OUTPATIENT PHYSICAL THERAPY NEURO TREATMENT- RECERTIFICATION   Patient Name: Debra [name] MRN: [id number] DOB:03-21-1956, 67 y.o., female Today's Date: 04/02/2023   PCP: [name], DO REFERRING PROVIDER: [name], DO  END OF SESSION:  PT End of Session - 04/02/23 1415     Visit Number 4    Number of Visits 9   Recert   Date for PT Re-Evaluation 04/30/23   Recert   Authorization Type [employer] medicare    PT Start Time 1414   Pt arrived late   PT Stop Time 1442    PT Time Calculation (min) 28 min    Equipment Utilized During Treatment --    Activity Tolerance Patient limited by fatigue    Behavior During Therapy [facility] for tasks assessed/performed                Past Medical History:  Diagnosis Date   Abdominal pain, right upper quadrant 04/30/2007   Allergies 02/03/2019   Allergy    Anemia, iron deficiency 04/17/2017   Aortic atherosclerosis 10/10/2017   Atypical chest pain 08/31/2014   Back pain with radiculopathy 01/25/2010   Check xray --- ? Cause of leg weakness   Chronic pain of both shoulders 10/31/2020   Constipation    DDD (degenerative disc disease)    Diabetes mellitus type II, uncontrolled 01/11/2020   Diastolic dysfunction 02/15/2010   Epilepsy    Childhood seziures; pt reports that she grew out of them and they have not occurred since childhood   Essential hypertension 08/31/2010   Poorly controlled will alter medications, encouraged DASH diet, minimize caffeine and obtain adequate sleep. Report concerning symptoms and follow up as directed and as needed [employer] metoprolol Edema may be c   Fatigue 01/25/2010   Generalized anxiety disorder 04/22/2018   Stable co'nt meds   GERD (gastroesophageal reflux disease) 07/16/2007   Hearing loss 07/24/2012   Refer to hearing clinic   Hiatal hernia 07/16/2007   Hyperglycemia, fasting 05/04/2010   Hyperlipidemia 04/17/2017   Encouraged heart healthy diet, increase exercise, avoid  trans fats, consider a krill oil cap daily   Hypothyroidism 11/02/2010   con't meds; Lab Results Component Value TSH 1.44 08/12/2017   Insomnia 04/30/2007   Knee pain 07/16/2007   Leg pain, bilateral 11/17/2008   Low back pain 07/16/2007   Major depressive disorder 01/30/2018   con't meds F/u counselor   Mixed connective tissue disease    with Raynaud's   Obstructive sleep apnea 01/30/2018   F/u with pulm; May be cause of some of her memory/concentration problems   Osteoarthritis    Osteoporosis    Palpitations 02/12/2008   Pansinusitis 01/30/2018   Peripheral vertigo 04/30/2007   Resolved rto prn   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   Patient Active Problem List   Diagnosis Date Noted   Type 2 diabetes mellitus with hyperglycemia, without long-term current use of insulin (HCC) 08/09/2022   Snapping lateral  band due to ligament laxity 02/19/2022   Moderate episode of recurrent major depressive disorder (HCC) 01/01/2022   Polyuria 01/01/2022   Mixed stress and urge urinary incontinence 01/01/2022   Wound infection 10/04/2021   Wound of right ankle 09/27/2021   Trigger thumb, right thumb 09/13/2021   Pruritic dermatitis 05/25/2021   Vaginal discharge 05/25/2021   Balance disorder 05/25/2021   Dizziness 05/25/2021   Osteoarthritis    Vitamin D deficiency    Urinary frequency 10/31/2020   Chronic pain of both shoulders 10/31/2020   Female pattern baldness 10/06/2020   Myalgia 10/06/2020   Urge incontinence of urine 10/06/2020   History of diabetes mellitus, type  II 01/11/2020   Allergies 02/03/2019   Acute vaginitis 04/22/2018   Morbid obesity 04/22/2018   Generalized anxiety disorder 04/22/2018   Obstructive sleep apnea 01/30/2018   Major depressive disorder 01/30/2018   Pansinusitis 01/30/2018   Aortic atherosclerosis 10/10/2017   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   Hyperlipidemia 04/17/2017   Anemia, iron deficiency 04/17/2017   Atypical chest pain 08/31/2014   Arm weakness 08/31/2014   Hearing loss 07/24/2012   Hypothyroidism 11/02/2010   Essential hypertension 08/31/2010   Hyperglycemia, fasting 05/04/2010   Diastolic dysfunction 02/15/2010   Back pain with radiculopathy 01/25/2010   Fatigue 01/25/2010   Tobacco abuse 12/01/2008   Restless legs syndrome 12/01/2008   Leg pain, bilateral 11/17/2008   Palpitations 02/12/2008   GERD (gastroesophageal reflux disease) 07/16/2007   Hiatal hernia 07/16/2007   Knee pain 07/16/2007   Low back pain 07/16/2007   Peripheral vertigo 04/30/2007   Insomnia 04/30/2007   Abdominal pain, right upper quadrant 04/30/2007    ONSET DATE: 02/11/2023 (referral)   REFERRING DIAG: R26.89 (ICD-10-CM) - Balance disorder  THERAPY DIAG:  Unsteadiness on feet  Rationale for Evaluation and Treatment: Rehabilitation  SUBJECTIVE:                                                                                                                                                                                             SUBJECTIVE STATEMENT: Pt reports she got back late last night from [place], is very tired today. Worked on her exercises a bit on her vacation but could not remember all of them, forgot her HEP at home. "I feel okay I guess"    Pt accompanied by: self  PERTINENT HISTORY: HTN, T2DM, GAD, multiple C-spine surgeries  PAIN:  Are you having pain? No  PRECAUTIONS: Fall  WEIGHT BEARING RESTRICTIONS: No  FALLS: Has patient fallen in last 6 months? No  LIVING ENVIRONMENT: Lives with: lives  alone Lives in: House/apartment Stairs: Yes: Internal: full  flight steps; on right going up and External: 1 steps; none Has following equipment at home: Grab bars and pt reports she has a plethora of medical equipment at home that she does not use   PLOF: Independent  PATIENT GOALS: "being able to get my balance back"  OBJECTIVE:   DIAGNOSTIC FINDINGS: MRI of brain in 2021: IMPRESSION: 1. No acute intracranial abnormality. 2. Moderate chronic white matter disease, progressed from prior MRI. Differential diagnosis include chronic microangiopathic changes, demyelinating disease, and other post inflammatory/infectious processes. 3. Susceptibility artifact from upper cervical spine hardware limits evaluation of the upper cervical spine.  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   COORDINATION: Heel to shin test: WFL bilaterally   EDEMA: Pt reports frequent swelling in distal LEs, (L>R)    POSTURE: rounded shoulders, forward head, and increased thoracic kyphosis   LOWER EXTREMITY MMT:  Tested in seated position   MMT Right Eval Left Eval  Hip flexion 4+ 5  Hip extension    Hip abduction 5 5  Hip adduction 5 5  Hip internal rotation    Hip external rotation    Knee flexion 4+ 5  Knee extension 5 5  Ankle dorsiflexion 5 5  Ankle plantarflexion    Ankle inversion    Ankle eversion    (Blank rows = not tested)  VITALS Vitals:   04/02/23 1419  BP: 107/72  Pulse: 85      TODAY'S TREATMENT:   Ther Act Discussed POC moving forward and pt very frazzled regarding question, stating she does not know if she would like to extend PT or not. Pt states her balance is still "off", especially when she is rushing or tired, but does feel as though it has gotten better as she only stumbled  once or twice while in [place] last week. Plan to assess goals next week and determine whether pt would like to DC or add appointments.  Added [employer] to pt's phone and added pt's HEP to it so that pt always has her exercises on her. Pt very excited about this and reports she will work on them this week.     PATIENT EDUCATION: Education details: See above  Person educated: Patient Education method: Explanation, Demonstration, and Handouts Education comprehension: verbalized understanding and needs further education  HOME EXERCISE PROGRAM: Access Code: [email or web address] URL: [email or web address] Date: 03/19/2023 Prepared by: Peter Congo  Exercises - Forward Monster Walk with Resistance at Ankles and Counter Support  - 1 x daily - 7 x weekly - 3 sets - 10 reps - Step Up  - 1 x daily - 7 x weekly - 3 sets - 10 reps - Lateral Step Up  - 1 x daily - 7 x weekly - 3 sets - 10 reps - Single Leg Stance  - 1 x daily - 7 x weekly - 1 sets - 5 reps - 30 sec hold  GOALS: Goals reviewed with patient? Yes  STG= LTG DUE TO POC LENGTH  LONG TERM GOALS: Target date: 04/03/2023  Pt will be independent with initial HEP for improved strength, balance, transfers and gait. Baseline: not established on eval Goal status: INITIAL  2.  MCTSIB to be assessed and goal updated Baseline: 120/120 (discontinued due to high baseline score) Goal status: MET  3.  Reactive balance section of miniBest to be assessed and  goal updated  Baseline:  Goal status: INITIAL  4.  Pt will ambulate greater than or equal to 1800 feet on mod I with no LOB for improved cardiovascular endurance and BLE strength.   Baseline: 1720' w/occasional SBA due to tripping over RLE Goal status: REVISED   ASSESSMENT:  CLINICAL IMPRESSION: Session limited as pt arrived late and reported high levels of fatigue following trip to [place]. Pt reports she does not know if PT has been helpful and "cannot think right now",  so would like to think about DC vs recertification to discuss next week. Pt reports she does feel slightly more stable, but still off balance when moving too quickly and occasionally when ascending stairs. Pt reports this may be due to anemia as well. Will send recertification this date to cover next scheduled visit. Continue POC.    OBJECTIVE IMPAIRMENTS: decreased activity tolerance, decreased balance, decreased cognition, decreased knowledge of condition, decreased knowledge of use of DME, difficulty walking, decreased safety awareness, dizziness, and impaired vision/preception.   ACTIVITY LIMITATIONS: lifting, bending, squatting, stairs, and locomotion level  PARTICIPATION LIMITATIONS: shopping, community activity, and occupation  PERSONAL FACTORS: Behavior pattern, Fitness, Past/current experiences, and 1 comorbidity: cognitive decline  are also affecting patient's functional outcome.   REHAB POTENTIAL: Good  CLINICAL DECISION MAKING: Stable/uncomplicated  EVALUATION COMPLEXITY: Low  PLAN:  PT FREQUENCY: 1x/week  PT DURATION: 4 weeks  PLANNED INTERVENTIONS: Therapeutic exercises, Therapeutic activity, Neuromuscular re-education, Balance training, Gait training, Patient/Family education, Self Care, Joint mobilization, Stair training, Vestibular training, Canalith repositioning, Orthotic/Fit training, DME instructions, Aquatic Therapy, Dry Needling, Manual therapy, and Re-evaluation  PLAN FOR NEXT SESSION: Check goals. DC? Monitor BP.  part of minibest and update goal. How is HEP? Add to HEP:- staggered DL, ankle DF   Ebony Rickel E Dontreal Miera, PT, DPT 04/02/2023, 2:43 PM

## 2023-04-09 ENCOUNTER — Ambulatory Visit: Payer: 59 | Admitting: Physical Therapy

## 2023-04-09 DIAGNOSIS — E119 Type 2 diabetes mellitus without complications: Secondary | ICD-10-CM | POA: Diagnosis not present

## 2023-04-09 DIAGNOSIS — H35363 Drusen (degenerative) of macula, bilateral: Secondary | ICD-10-CM | POA: Diagnosis not present

## 2023-04-09 DIAGNOSIS — Z961 Presence of intraocular lens: Secondary | ICD-10-CM | POA: Diagnosis not present

## 2023-04-09 DIAGNOSIS — H40023 Open angle with borderline findings, high risk, bilateral: Secondary | ICD-10-CM | POA: Diagnosis not present

## 2023-04-09 LAB — HM DIABETES EYE EXAM

## 2023-04-10 ENCOUNTER — Encounter: Payer: Self-pay | Admitting: Family Medicine

## 2023-04-16 ENCOUNTER — Ambulatory Visit: Payer: 59 | Admitting: Physician Assistant

## 2023-04-17 ENCOUNTER — Other Ambulatory Visit: Payer: Self-pay | Admitting: Family Medicine

## 2023-04-17 DIAGNOSIS — I10 Essential (primary) hypertension: Secondary | ICD-10-CM

## 2023-04-18 ENCOUNTER — Ambulatory Visit: Payer: 59 | Admitting: Physical Therapy

## 2023-04-18 VITALS — BP 112/77 | HR 74

## 2023-04-18 DIAGNOSIS — R2681 Unsteadiness on feet: Secondary | ICD-10-CM | POA: Diagnosis not present

## 2023-04-18 DIAGNOSIS — R279 Unspecified lack of coordination: Secondary | ICD-10-CM | POA: Diagnosis not present

## 2023-04-18 DIAGNOSIS — M6281 Muscle weakness (generalized): Secondary | ICD-10-CM

## 2023-04-18 NOTE — Therapy (Signed)
OUTPATIENT PHYSICAL THERAPY NEURO TREATMENT- DISCHARGE SUMMARY   Patient Name: Debra [name] MRN: [id number] DOB:Mar 21, 1956, 67 y.o., female Today's Date: 04/18/2023   PCP: [name], DO REFERRING PROVIDER: Zola Button, Grayling Congress, DO  PHYSICAL THERAPY DISCHARGE SUMMARY  Visits from Start of Care: 5  Current functional level related to goals / functional outcomes: Mod I w/all ADLs without AD   Remaining deficits: Impaired short term memory, delayed processing   Education / Equipment: HEP, plan to obtain SLP referral to address memory deficits    Patient agrees to discharge. Patient goals were partially met. Patient is being discharged due to maximized rehab potential.    END OF SESSION:  PT End of Session - 04/18/23 1411     Visit Number 5    Number of Visits 9   Recert   Date for PT Re-Evaluation 04/30/23   Recert   Authorization Type [employer] medicare    PT Start Time 1410   Pt arrived late   PT Stop Time 1440   DC   PT Time Calculation (min) 30 min    Equipment Utilized During Treatment Gait belt    Activity Tolerance Patient tolerated treatment well    Behavior During Therapy [facility] for tasks assessed/performed                Past Medical History:  Diagnosis Date   Abdominal pain, right upper quadrant 04/30/2007   Allergies 02/03/2019   Allergy    Anemia, iron deficiency 04/17/2017   Aortic atherosclerosis 10/10/2017   Atypical chest pain 08/31/2014   Back pain with radiculopathy 01/25/2010   Check xray --- ? Cause of leg weakness   Chronic pain of both shoulders 10/31/2020   Constipation    DDD (degenerative disc disease)    Diabetes mellitus type II, uncontrolled 01/11/2020   Diastolic dysfunction 02/15/2010   Epilepsy    Childhood seziures; pt reports that she grew out of them and they have not occurred since childhood   Essential hypertension 08/31/2010   Poorly controlled will alter medications, encouraged DASH diet, minimize caffeine  and obtain adequate sleep. Report concerning symptoms and follow up as directed and as needed [employer] metoprolol Edema may be c   Fatigue 01/25/2010   Generalized anxiety disorder 04/22/2018   Stable co'nt meds   GERD (gastroesophageal reflux disease) 07/16/2007   Hearing loss 07/24/2012   Refer to hearing clinic   Hiatal hernia 07/16/2007   Hyperglycemia, fasting 05/04/2010   Hyperlipidemia 04/17/2017   Encouraged heart healthy diet, increase exercise, avoid trans fats, consider a krill oil cap daily   Hypothyroidism 11/02/2010   con't meds; Lab Results Component Value TSH 1.44 08/12/2017   Insomnia 04/30/2007   Knee pain 07/16/2007   Leg pain, bilateral 11/17/2008   Low back pain 07/16/2007   Major depressive disorder 01/30/2018   con't meds F/u counselor   Mixed connective tissue disease    with Raynaud's   Obstructive sleep apnea 01/30/2018   F/u with pulm; May be cause of some of her memory/concentration problems   Osteoarthritis    Osteoporosis    Palpitations 02/12/2008   Pansinusitis 01/30/2018   Peripheral vertigo 04/30/2007   Resolved rto prn   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   Patient Active Problem List   Diagnosis Date Noted   Type 2 diabetes mellitus with hyperglycemia, without long-term current use of insulin (HCC) 08/09/2022   Snapping lateral band due to ligament laxity 02/19/2022   Moderate episode of recurrent major depressive disorder (HCC) 01/01/2022   Polyuria 01/01/2022   Mixed stress and urge urinary incontinence 01/01/2022    Wound infection 10/04/2021   Wound of right ankle 09/27/2021   Trigger thumb, right thumb 09/13/2021   Pruritic dermatitis 05/25/2021   Vaginal discharge 05/25/2021   Balance disorder 05/25/2021   Dizziness 05/25/2021   Osteoarthritis    Vitamin D deficiency    Urinary frequency 10/31/2020   Chronic pain of both shoulders 10/31/2020   Female pattern baldness 10/06/2020   Myalgia 10/06/2020   Urge incontinence of urine 10/06/2020   History of diabetes mellitus, type II 01/11/2020   Allergies 02/03/2019   Acute vaginitis 04/22/2018   Morbid obesity 04/22/2018   Generalized anxiety disorder 04/22/2018   Obstructive sleep apnea 01/30/2018   Major depressive disorder 01/30/2018   Pansinusitis 01/30/2018   Aortic atherosclerosis 10/10/2017   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   Hyperlipidemia 04/17/2017   Anemia, iron deficiency 04/17/2017   Atypical chest pain 08/31/2014   Arm weakness 08/31/2014   Hearing loss 07/24/2012   Hypothyroidism 11/02/2010   Essential hypertension 08/31/2010   Hyperglycemia, fasting 05/04/2010   Diastolic dysfunction 02/15/2010   Back pain with radiculopathy 01/25/2010   Fatigue 01/25/2010   Tobacco abuse 12/01/2008   Restless legs syndrome 12/01/2008   Leg pain, bilateral 11/17/2008   Palpitations 02/12/2008   GERD (gastroesophageal reflux disease) 07/16/2007   Hiatal hernia 07/16/2007   Knee pain 07/16/2007   Low back pain 07/16/2007   Peripheral vertigo 04/30/2007   Insomnia 04/30/2007   Abdominal pain, right upper quadrant 04/30/2007    ONSET DATE: 02/11/2023 (referral)   REFERRING DIAG: R26.89 (ICD-10-CM) - Balance disorder  THERAPY DIAG:  Unsteadiness on feet  Muscle weakness (generalized)  Unspecified lack of coordination  Rationale for Evaluation and Treatment: Rehabilitation  SUBJECTIVE:                                                                                                                                                                                              SUBJECTIVE STATEMENT: Pt presents without AD. States she has  not been doing her HEP, "I cannot find that paper". Pt forgot that therapist put exercises on her phone last session. Denies falls, has brain MRI on 7/9.    Pt accompanied by: self  PERTINENT HISTORY: HTN, T2DM, GAD, multiple C-spine surgeries  PAIN:  Are you having pain? No  PRECAUTIONS: Fall  WEIGHT BEARING RESTRICTIONS: No  FALLS: Has patient fallen in last 6 months? No  LIVING ENVIRONMENT: Lives with: lives alone Lives in: House/apartment Stairs: Yes: Internal: full flight steps; on right going up and External: 1 steps; none Has following equipment at home: Grab bars and pt reports she has a plethora of medical equipment at home that she does not use   PLOF: Independent  PATIENT GOALS: "being able to get my balance back"  OBJECTIVE:   DIAGNOSTIC FINDINGS: MRI of brain in 2021: IMPRESSION: 1. No acute intracranial abnormality. 2. Moderate chronic white matter disease, progressed from prior MRI. Differential diagnosis include chronic microangiopathic changes, demyelinating disease, and other post inflammatory/infectious processes. 3. Susceptibility artifact from upper cervical spine hardware limits evaluation of the upper cervical spine.  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   COORDINATION: Heel to shin test: WFL bilaterally   EDEMA: Pt reports frequent swelling in distal LEs, (L>R)    POSTURE: rounded shoulders, forward head, and increased thoracic kyphosis   LOWER EXTREMITY MMT:  Tested in seated position   MMT Right Eval Left Eval  Hip flexion 4+ 5  Hip extension    Hip abduction 5 5  Hip adduction 5 5  Hip internal rotation    Hip external rotation     Knee flexion 4+ 5  Knee extension 5 5  Ankle dorsiflexion 5 5  Ankle plantarflexion    Ankle inversion    Ankle eversion    (Blank rows = not tested)  VITALS Vitals:   04/18/23 1418  BP: 112/77  Pulse: 74      TODAY'S TREATMENT:   Ther Act Assessed vitals (see above) and WNL for therapy     Gait pattern: [facility] Distance walked: 115' loop completed 11x + 77' = 1342' Assistive device utilized: None Level of assistance: Modified independence Comments: No instability noted. Pt ambulating w/decreased velocity this date due to wearing sandals and talking throughout assessment.   Discussed goal outcomes and therapist recommendation that pt participate in SLP, as she has worsening memory deficits that are strongly contributing to her "imbalance". Informed pt that there are no physical deficits contributing to her imbalance at this time. Pt in agreement to participate in SLP.  Educated pt on how to obtain new PT referral if balance declines or following her MRI in July. Pt verbalized understanding.  Pt requested to sit on 26" theraball, as she "cannot balance at all" on hers at home. Pt able to perform stand to sit on ball and stabilize independently >5 minutes while talking to therapist. Pt reports she thinks she has a different ball at home, but cannot remember. Pt requested information on theraball used in clinic, so wrote down size for pt on a sticky note.  Pt unable to find her phone at end of session, states she does not remember when she last had it. Therapist called pt's phone, but pt unable to locate amongst her personal belongings.    PATIENT EDUCATION: Education details: Goal outcomes, plan  to obtain SLP referral   Person educated: Patient Education method: Explanation, Demonstration, and Handouts Education comprehension: verbalized understanding and needs further education  HOME EXERCISE PROGRAM: Access Code: [email or web address] URL: [email or web address] Date:  03/19/2023 Prepared by: Peter Congo  Exercises - Forward Monster Walk with Resistance at Ankles and Counter Support  - 1 x daily - 7 x weekly - 3 sets - 10 reps - Step Up  - 1 x daily - 7 x weekly - 3 sets - 10 reps - Lateral Step Up  - 1 x daily - 7 x weekly - 3 sets - 10 reps - Single Leg Stance  - 1 x daily - 7 x weekly - 1 sets - 5 reps - 30 sec hold  GOALS: Goals reviewed with patient? Yes  STG= LTG DUE TO POC LENGTH  LONG TERM GOALS: Target date: 04/03/2023  Pt will be independent with initial HEP for improved strength, balance, transfers and gait. Baseline: not established on eval Goal status: NOT MET  2.  MCTSIB to be assessed and goal updated Baseline: 120/120 (discontinued due to high baseline score) Goal status: MET  3.  Reactive balance section of miniBest to be assessed and goal updated  Baseline:  Goal status: DC  4.  Pt will ambulate greater than or equal to 1800 feet on mod I with no LOB for improved cardiovascular endurance and BLE strength.   Baseline: 1720' w/occasional SBA due to tripping over RLE; 1342' mod I Goal status: NOT MET   ASSESSMENT:  CLINICAL IMPRESSION: Emphasis of skilled PT session on LTG assessment and DC from PT. Pt has met 2 of 3 LTGs, demonstrating functional use of all 3 balance systems. Pt unable to remember to perform her HEP despite therapist spending previous session adding all exercises to pt's phone w/pt. Pt also unable to locate her HEP handout, despite being printed for pt twice. Pt wearing inappropriate footwear this date, impacting her score on . However, no instability noted during assessment. Informed pt that therapist recommending pt receive SLP at this time due to worsening memory, pt in agreement. Pt scoring within normative values on all balance assessments and has not had any recent falls, so unable to identify balance impairment at this time. Encouraged pt to return if her balance declines or if there are changes  noted on her MRI in July. Pt verbalized understanding.    OBJECTIVE IMPAIRMENTS: decreased activity tolerance, decreased balance, decreased cognition, decreased knowledge of condition, decreased knowledge of use of DME, difficulty walking, decreased safety awareness, dizziness, and impaired vision/preception.   ACTIVITY LIMITATIONS: lifting, bending, squatting, stairs, and locomotion level  PARTICIPATION LIMITATIONS: shopping, community activity, and occupation  PERSONAL FACTORS: Behavior pattern, Fitness, Past/current experiences, and 1 comorbidity: cognitive decline  are also affecting patient's functional outcome.   REHAB POTENTIAL: Good  CLINICAL DECISION MAKING: Stable/uncomplicated  EVALUATION COMPLEXITY: Low  PLAN:  PT FREQUENCY: 1x/week  PT DURATION: 4 weeks  PLANNED INTERVENTIONS: Therapeutic exercises, Therapeutic activity, Neuromuscular re-education, Balance training, Gait training, Patient/Family education, Self Care, Joint mobilization, Stair training, Vestibular training, Canalith repositioning, Orthotic/Fit training, DME instructions, Aquatic Therapy, Dry Needling, Manual therapy, and Re-evaluation    Yahmir Sokolov E Gregery Walberg, PT, DPT 04/18/2023, 2:42 PM

## 2023-04-19 ENCOUNTER — Telehealth: Payer: Self-pay | Admitting: Physical Therapy

## 2023-04-19 NOTE — Telephone Encounter (Signed)
Dr. Laury Axon,  Debra [name] was discharged by PT on 04/18/23.  The patient would benefit from a SLP evaluation for memory deficits.     If you agree, please place an order in [facility] workque in [facility] or fax the order to [phone].  Thank you, Josephine Igo, PT, DPT [facility] [address] [place], [place]  [address] Phone:  [phone] Fax:  [phone]

## 2023-04-21 ENCOUNTER — Other Ambulatory Visit: Payer: Self-pay | Admitting: Family Medicine

## 2023-04-21 DIAGNOSIS — R413 Other amnesia: Secondary | ICD-10-CM

## 2023-04-21 NOTE — Addendum Note (Signed)
Addended by: Seabron Spates R on: 04/21/2023 08:25 AM   Modules accepted: Orders
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
## 2023-04-24 ENCOUNTER — Ambulatory Visit: Payer: 59 | Admitting: Speech Pathology

## 2023-04-24 DIAGNOSIS — R41841 Cognitive communication deficit: Secondary | ICD-10-CM

## 2023-04-24 DIAGNOSIS — M6281 Muscle weakness (generalized): Secondary | ICD-10-CM | POA: Diagnosis not present

## 2023-04-24 DIAGNOSIS — R279 Unspecified lack of coordination: Secondary | ICD-10-CM | POA: Diagnosis not present

## 2023-04-24 DIAGNOSIS — R2681 Unsteadiness on feet: Secondary | ICD-10-CM | POA: Diagnosis not present

## 2023-05-03 ENCOUNTER — Other Ambulatory Visit: Payer: Self-pay | Admitting: Family Medicine

## 2023-05-03 DIAGNOSIS — G8929 Other chronic pain: Secondary | ICD-10-CM

## 2023-05-06 ENCOUNTER — Ambulatory Visit
Admission: RE | Admit: 2023-05-06 | Discharge: 2023-05-06 | Disposition: A | Discharge status: Home / Self Care | Payer: 59 | Source: Ambulatory Visit | Attending: Physician Assistant | Admitting: Physician Assistant

## 2023-05-06 DIAGNOSIS — R413 Other amnesia: Secondary | ICD-10-CM | POA: Diagnosis not present

## 2023-05-09 ENCOUNTER — Ambulatory Visit: Payer: 59 | Attending: Family Medicine | Admitting: Speech Pathology

## 2023-05-09 DIAGNOSIS — R41841 Cognitive communication deficit: Secondary | ICD-10-CM | POA: Diagnosis present
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
## 2023-05-09 NOTE — Patient Instructions (Signed)
Use your reminder app.  Click "+ New Reminder"  Write what you need to remember  Pick a day  Pick a time.  Click done.    Put your "Take Medication" check sheet somewhere in the kitchen that you will see every day.  Check off the days you have taken your medication.

## 2023-05-15 ENCOUNTER — Ambulatory Visit: Payer: 59 | Admitting: Speech Pathology

## 2023-05-15 DIAGNOSIS — R41841 Cognitive communication deficit: Secondary | ICD-10-CM | POA: Diagnosis not present
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
## 2023-05-19 ENCOUNTER — Encounter: Payer: Self-pay | Admitting: Physician Assistant

## 2023-05-19 ENCOUNTER — Ambulatory Visit: Payer: 59 | Admitting: Physician Assistant

## 2023-05-21 ENCOUNTER — Other Ambulatory Visit: Payer: Self-pay | Admitting: Family Medicine

## 2023-05-21 ENCOUNTER — Ambulatory Visit: Payer: 59 | Admitting: Speech Pathology

## 2023-05-21 DIAGNOSIS — T7840XD Allergy, unspecified, subsequent encounter: Secondary | ICD-10-CM

## 2023-05-21 DIAGNOSIS — R41841 Cognitive communication deficit: Secondary | ICD-10-CM | POA: Diagnosis not present

## 2023-05-21 NOTE — Patient Instructions (Signed)
When you use the Reminder App make sure you add a day/time for the reminder to come.  Click the event  Hit the orange I on the right.  Turn on date with the slide bar   Pick a day Turn on the time with the slide bar  Pick a time  Remember to add enough time for prep/commute  Hit done.  Double check.
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
## 2023-05-27 ENCOUNTER — Ambulatory Visit: Payer: 59 | Admitting: Speech Pathology

## 2023-05-27 DIAGNOSIS — R41841 Cognitive communication deficit: Secondary | ICD-10-CM
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
## 2023-06-03 ENCOUNTER — Other Ambulatory Visit (HOSPITAL_COMMUNITY): Payer: Self-pay

## 2023-06-03 ENCOUNTER — Ambulatory Visit: Payer: 59 | Attending: Family Medicine | Admitting: Speech Pathology

## 2023-06-03 DIAGNOSIS — R41841 Cognitive communication deficit: Secondary | ICD-10-CM | POA: Insufficient documentation
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
## 2023-06-03 NOTE — Patient Instructions (Addendum)
Medication:   Find a small jewelry dish to put pills in when you take out of med box.  If you see pills in dish later, that means you did not finish your pills from earlier, go ahead and take them.   ATTENTION -- WE NEED TO MANAGE DISTRACTIONS   Distraction:                                          Solution:  TV Set timer Use sleep timer Look up shows you want to watch to help schedule your day*  House noises   Thinking of to-do list Start putting to-do list in something that is less likely to get lost. I recommend small planner that you can keep with you. Schedule in your to-do list by day.   Phone calls Change ring tone for kids and don't ignore that tone* Otherwise silent notifications Schedule complex/stressful calls into day when it is not interrupting your work or completion of needed activities.

## 2023-06-04 ENCOUNTER — Ambulatory Visit (INDEPENDENT_AMBULATORY_CARE_PROVIDER_SITE_OTHER): Payer: 59 | Admitting: Physician Assistant

## 2023-06-04 ENCOUNTER — Encounter: Payer: Self-pay | Admitting: Physician Assistant

## 2023-06-04 VITALS — BP 109/70 | HR 78 | Resp 20 | Ht 63.0 in | Wt 155.0 lb

## 2023-06-04 DIAGNOSIS — R413 Other amnesia: Secondary | ICD-10-CM

## 2023-06-04 MED ORDER — DONEPEZIL HCL 5 MG PO TABS: 5.0000 mg | ORAL_TABLET | Freq: Every day | ORAL | 11 refills | Status: DC

## 2023-06-04 NOTE — Patient Instructions (Signed)
It was a pleasure to see you today at our office.    Neuropsych evaluation Recommend psychotherapy for anxiety and depression Discontinue smoking  Resume CPAP for sleep apnea Continue the management of iron deficiency anemia Control the BP, cholesterol and sugar and thyroid We will start donepezil  5 mg daily  Follow up in 6 months    Recommendations:  You have been referred for a neuropsychological evaluation (i.e., evaluation of memory and thinking abilities). Please bring someone with you to this appointment if possible, as it is helpful for the doctor to hear from both you and another adult who knows you well. Please bring eyeglasses and hearing aids if you wear them.    The evaluation will take approximately 3 hours and has two parts:   The first part is a clinical interview with the neuropsychologist (Dr. Milbert Coulter or Dr. Roseanne Reno). During the interview, the neuropsychologist will speak with you and the individual you brought to the appointment.    The second part of the evaluation is testing with the doctor's technician Annabelle Harman or Selena Batten). During the testing, the technician will ask you to remember different types of material, solve problems, and answer some questionnaires. Your family member will not be present for this portion of the evaluation.   Please note: We must reserve several hours of the neuropsychologist's time and the psychometrician's time for your evaluation appointment. As such, there is a No-Show fee of $100. If you are unable to attend any of your appointments, please contact our office as soon as possible to reschedule.  Check labs today   Follow up in 1 month.    For assessment of decision of mental capacity and competency:  Call Dr. Erick Blinks, geriatric psychiatrist at [phone] Counseling regarding caregiver distress, including caregiver depression, anxiety and issues regarding community resources, adult day care programs, adult living facilities, or memory care  questions:  please contact your  Primary Doctor's Social Worker  Whom to call: Memory  decline, memory medications: Call our office [phone]  For psychiatric meds, mood meds: Please have your primary care physician manage these medications.  If you have any severe symptoms of a stroke, or other severe issues such as confusion,severe chills or fever, etc call 911 or go to the ER as you may need to be evaluated further    RECOMMENDATIONS FOR ALL PATIENTS WITH MEMORY PROBLEMS: 1. Continue to exercise (Recommend 30 minutes of walking everyday, or 3 hours every week) 2. Increase social interactions - continue going to [place] and enjoy social gatherings with friends and family 3. Eat healthy, avoid fried foods and eat more fruits and vegetables 4. Maintain adequate blood pressure, blood sugar, and blood cholesterol level. Reducing the risk of stroke and cardiovascular disease also helps promoting better memory. 5. Avoid stressful situations. Live a simple life and avoid aggravations. Organize your time and prepare for the next day in anticipation. 6. Sleep well, avoid any interruptions of sleep and avoid any distractions in the bedroom that may interfere with adequate sleep quality 7. Avoid sugar, avoid sweets as there is a strong link between excessive sugar intake, diabetes, and cognitive impairment We discussed the Mediterranean diet, which has been shown to help patients reduce the risk of progressive memory disorders and reduces cardiovascular risk. This includes eating fish, eat fruits and green leafy vegetables, nuts like almonds and hazelnuts, walnuts, and also use olive oil. Avoid fast foods and fried foods as much as possible. Avoid sweets and sugar as sugar use  has been linked to worsening of memory function.  There is always a concern of gradual progression of memory problems. If this is the case, then we may need to adjust level of care according to patient needs. Support, both to the  patient and caregiver, should then be put into place.       FALL PRECAUTIONS: Be cautious when walking. Scan the area for obstacles that may increase the risk of trips and falls. When getting up in the mornings, sit up at the edge of the bed for a few minutes before getting out of bed. Consider elevating the bed at the head end to avoid drop of blood pressure when getting up. Walk always in a well-lit room (use night lights in the walls). Avoid area rugs or power cords from appliances in the middle of the walkways. Use a walker or a cane if necessary and consider physical therapy for balance exercise. Get your eyesight checked regularly.  FINANCIAL OVERSIGHT: Supervision, especially oversight when making financial decisions or transactions is also recommended.  HOME SAFETY: Consider the safety of the kitchen when operating appliances like stoves, microwave oven, and blender. Consider having supervision and share cooking responsibilities until no longer able to participate in those. Accidents with firearms and other hazards in the house should be identified and addressed as well.   ABILITY TO BE LEFT ALONE: If patient is unable to contact 911 operator, consider using LifeLine, or when the need is there, arrange for someone to stay with patients. Smoking is a fire hazard, consider supervision or cessation. Risk of wandering should be assessed by caregiver and if detected at any point, supervision and safe proof recommendations should be instituted.  MEDICATION SUPERVISION: Inability to self-administer medication needs to be constantly addressed. Implement a mechanism to ensure safe administration of the medications.   DRIVING: Regarding driving, in patients with progressive memory problems, driving will be impaired. We advise to have someone else do the driving if trouble finding directions or if minor accidents are reported. Independent driving assessment is available to determine safety of  driving.   If you are interested in the driving assessment, you can contact the following:  [facility] [employer] in [place] [phone]  [facility] [phone]  [facility] [phone] or [phone]    Mediterranean Diet A Mediterranean diet refers to food and lifestyle choices that are based on the traditions of countries located on the [employer]. This way of eating has been shown to help prevent certain conditions and improve outcomes for people who have chronic diseases, like kidney disease and heart disease. What are tips for following this plan? Lifestyle  Cook and eat meals together with your family, when possible. Drink enough fluid to keep your urine clear or pale yellow. Be physically active every day. This includes: Aerobic exercise like running or swimming. Leisure activities like gardening, walking, or housework. Get 7-8 hours of sleep each night. If recommended by your health care provider, drink red wine in moderation. This means 1 glass a day for nonpregnant women and 2 glasses a day for men. A glass of wine equals 5 oz (150 mL). Reading food labels  Check the serving size of packaged foods. For foods such as rice and pasta, the serving size refers to the amount of cooked product, not dry. Check the total fat in packaged foods. Avoid foods that have saturated fat or trans fats. Check the ingredients list for added sugars, such as corn  syrup. Shopping  At the grocery store, buy most of your food from the areas near the walls of the store. This includes: Fresh fruits and vegetables (produce). Grains, beans, nuts, and seeds. Some of these may be available in unpackaged forms or large amounts (in bulk). Fresh seafood. Poultry and eggs. Low-fat dairy products. Buy whole ingredients instead of prepackaged foods. Buy fresh fruits and vegetables in-season from local farmers markets. Buy  frozen fruits and vegetables in resealable bags. If you do not have access to quality fresh seafood, buy precooked frozen shrimp or canned fish, such as tuna, salmon, or sardines. Buy small amounts of raw or cooked vegetables, salads, or olives from the deli or salad bar at your store. Stock your pantry so you always have certain foods on hand, such as olive oil, canned tuna, canned tomatoes, rice, pasta, and beans. Cooking  Cook foods with extra-virgin olive oil instead of using butter or other vegetable oils. Have meat as a side dish, and have vegetables or grains as your main dish. This means having meat in small portions or adding small amounts of meat to foods like pasta or stew. Use beans or vegetables instead of meat in common dishes like chili or lasagna. Experiment with different cooking methods. Try roasting or broiling vegetables instead of steaming or sauteing them. Add frozen vegetables to soups, stews, pasta, or rice. Add nuts or seeds for added healthy fat at each meal. You can add these to yogurt, salads, or vegetable dishes. Marinate fish or vegetables using olive oil, lemon juice, garlic, and fresh herbs. Meal planning  Plan to eat 1 vegetarian meal one day each week. Try to work up to 2 vegetarian meals, if possible. Eat seafood 2 or more times a week. Have healthy snacks readily available, such as: Vegetable sticks with hummus. Greek yogurt. Fruit and nut trail mix. Eat balanced meals throughout the week. This includes: Fruit: 2-3 servings a day Vegetables: 4-5 servings a day Low-fat dairy: 2 servings a day Fish, poultry, or lean meat: 1 serving a day Beans and legumes: 2 or more servings a week Nuts and seeds: 1-2 servings a day Whole grains: 6-8 servings a day Extra-virgin olive oil: 3-4 servings a day Limit red meat and sweets to only a few servings a month What are my food choices? Mediterranean diet Recommended Grains: Whole-grain pasta. Brown rice. Bulgar  wheat. Polenta. Couscous. Whole-wheat bread. Orpah Cobb. Vegetables: Artichokes. Beets. Broccoli. Cabbage. Carrots. Eggplant. Green beans. Chard. Kale. Spinach. Onions. Leeks. Peas. Squash. Tomatoes. Peppers. Radishes. Fruits: Apples. Apricots. Avocado. Berries. Bananas. Cherries. Dates. Figs. Grapes. Lemons. Melon. Oranges. Peaches. Plums. Pomegranate. Meats and other protein foods: Beans. Almonds. Sunflower seeds. Pine nuts. Peanuts. Cod. Salmon. Scallops. Shrimp. Tuna. Tilapia. Clams. Oysters. Eggs. Dairy: Low-fat milk. Cheese. Greek yogurt. Beverages: Water. Red wine. Herbal tea. Fats and oils: Extra virgin olive oil. Avocado oil. Grape seed oil. Sweets and desserts: [place] yogurt with honey. Baked apples. Poached pears. Trail mix. Seasoning and other foods: Basil. Cilantro. Coriander. Cumin. Mint. Parsley. Sage. Rosemary. Tarragon. Garlic. Oregano. Thyme. Pepper. Balsalmic vinegar. Tahini. Hummus. Tomato sauce. Olives. Mushrooms. Limit these Grains: Prepackaged pasta or rice dishes. Prepackaged cereal with added sugar. Vegetables: Deep fried potatoes (french fries). Fruits: Fruit canned in syrup. Meats and other protein foods: Beef. Pork. Lamb. Poultry with skin. Hot dogs. Tomasa Blase. Dairy: Ice cream. Sour cream. Whole milk. Beverages: Juice. Sugar-sweetened soft drinks. Beer. Liquor and spirits. Fats and oils: Butter. Canola oil. Vegetable oil. Beef fat (tallow). Lard. Sweets  and desserts: Cookies. Cakes. Pies. Candy. Seasoning and other foods: Mayonnaise. Premade sauces and marinades. The items listed may not be a complete list. Talk with your dietitian about what dietary choices are right for you. Summary The Mediterranean diet includes both food and lifestyle choices. Eat a variety of fresh fruits and vegetables, beans, nuts, seeds, and whole grains. Limit the amount of red meat and sweets that you eat. Talk with your health care provider about whether it is safe for you to drink red  wine in moderation. This means 1 glass a day for nonpregnant women and 2 glasses a day for men. A glass of wine equals 5 oz (150 mL). This information is not intended to replace advice given to you by your health care provider. Make sure you discuss any questions you have with your health care provider. Document Released: 06/06/2016 Document Revised: 07/09/2016 Document Reviewed: 06/06/2016 Elsevier Interactive Patient Education  2017 [employer].

## 2023-06-04 NOTE — Progress Notes (Signed)
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 RH female with a history of iron deficiency anemia, chronic back pain, GAD, MDD, hearing loss, hypertension, hyperlipidemia, RLS, GERD, DM2, vitamin D deficiency, hypothyroidism  presenting today for their review of the MRI results. Patient is here alone previous records as well as any outside records available were reviewed prior to todays visit.   Patient was last seen on 03/13/2023 with [facility] 22/30. MRI of the brain, personally reviewed, without disproportionate lobar atrophy, but with moderate small vessel ischemic disease but without acute findings.  There is concern for vascular disease as one of the etiologies of her memory loss.  She continues to smoke  Recommendations:   Follow up in 6 months. Start Donepezil 5mg   daily. Side effects discussed   Continue the management of iron deficiency anemia Resume CPAP for OSA Recommend resuming counseling for MDD and GAD Continue PT for chronic back pain Recommend checking with audiologist for decreased hearing Recommend good control of cardiovascular risk factors Continue to control mood as per PCP    Subjective:      Initial visit 03/13/2023 How long did patient have memory difficulties?  "For several years, worse over the last year ".Patient has some difficulty remembering recent conversations and people names. It is worse after Covid 2 years ago."  "I was tested in the past for ADD, may have a slight but not a lot" repeats oneself?  Denies repeating.  "It' s me the one that is asking them to repeat ".  Disoriented when walking into a room?  Patient denies except occasionally not remembering what patient came to the room for.    Leaving objects in unusual places?  "I tried to get rid of stuff.  I have been trying to declog my house ". Wandering behavior?  denies   Any personality changes since last visit?  Patient denies    Any history of depression?:  Endorsed, she has a history of depression and anxiety, followed a counselor "but have not seen them in a while ". Hallucinations or paranoia?  P "I have seen something run across but turn my head and there is no one, not often ".   Seizures?   "I had epilepsy as a child "  Any sleep changes?  She has insomnia . Does not sleep well, wakes up during the night, yesterday she slept 3 hrs. Takes Requip that helps.  Sometimes she has vivid dreams, denies  REM behavior or sleepwalking   Sleep apnea? Endorsed, years ago I got one, but I stopped using it for years "the CPAP is sitting out there".    Any hygiene concerns?  Patient denies   Independent of bathing and dressing?  Endorsed  Does the patient needs help with medications?  Patient is in charge, misses doses   Who is in charge of the finances?  Patient is in charge, has missed bills in the past      Any changes in appetite?  I am on something for my DM, Ozempic, to lose weight . Not drinking enough water  Patient have trouble swallowing? denies   Does the patient cook?  Yes. Forgets common recipes  Any kitchen accidents such as leaving the stove on? denies   Any headaches?   denies   Chronic back pain ? Endorsed, takes pain meds "did physical therapy for a while ", "It was rough" Ambulates with difficulty?  Chronic, Sometimes  I lose the strength in my legs.  Recent falls or head injuries? Endorsed, sometimes my legs are weaker. I am to attend PT for strength Vision changes?  She had recent cataract surgery "it did not help much " Unilateral weakness, numbness or tingling? denies   Any tremors?   denies   Any anosmia?  denies   Any incontinence of urine?  She has a history of urge incontinence. Wears pads  Any bowel dysfunction?  Some constipation    Patient lives alone  History of heavy alcohol intake?  Denies  history of heavy tobacco use?  Trying to quit, uses nicotine patch. Was doing 1/2 ppd but stopped 4  weeks ago  Family history of dementia? Aunt has dementia unknown type Does patient drive? Endorsed, sometimes I have to use my GPS.  Past Medical History:  Diagnosis Date   Abdominal pain, right upper quadrant 04/30/2007   Allergies 02/03/2019   Allergy    Anemia, iron deficiency 04/17/2017   Aortic atherosclerosis 10/10/2017   Atypical chest pain 08/31/2014   Back pain with radiculopathy 01/25/2010   Check xray --- ? Cause of leg weakness   Chronic pain of both shoulders 10/31/2020   Constipation    DDD (degenerative disc disease)    Diabetes mellitus type II, uncontrolled 01/11/2020   Diastolic dysfunction 02/15/2010   Epilepsy    Childhood seziures; pt reports that she grew out of them and they have not occurred since childhood   Essential hypertension 08/31/2010   Poorly controlled will alter medications, encouraged DASH diet, minimize caffeine and obtain adequate sleep. Report concerning symptoms and follow up as directed and as needed [employer] metoprolol Edema may be c   Fatigue 01/25/2010   Generalized anxiety disorder 04/22/2018   Stable co'nt meds   GERD (gastroesophageal reflux disease) 07/16/2007   Hearing loss 07/24/2012   Refer to hearing clinic   Hiatal hernia 07/16/2007   Hyperglycemia, fasting 05/04/2010   Hyperlipidemia 04/17/2017   Encouraged heart healthy diet, increase exercise, avoid trans fats, consider a krill oil cap daily   Hypothyroidism 11/02/2010   con't meds; Lab Results Component Value TSH 1.44 08/12/2017   Insomnia 04/30/2007   Knee pain 07/16/2007   Leg pain, bilateral 11/17/2008   Low back pain 07/16/2007   Major depressive disorder 01/30/2018   con't meds F/u counselor   Mixed connective tissue disease    with Raynaud's   Obstructive sleep apnea 01/30/2018   F/u with pulm; May be cause of some of her memory/concentration problems   Osteoarthritis    Osteoporosis    Palpitations 02/12/2008   Pansinusitis 01/30/2018   Peripheral  vertigo 04/30/2007   Resolved rto prn   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     PREVIOUS MEDICATIONS:   CURRENT MEDICATIONS:  Outpatient Encounter Medications as of 06/04/2023  Medication Sig   Accu-Chek Softclix Lancets lancets USE TO CHECK BLOOD GLUCOSE ONCE A DAY   azelastine (ASTELIN) 0.1 %  nasal spray Place 2 sprays into both nostrils 2 (two) times daily. Use in each nostril as directed   Blood Glucose Monitoring Suppl (ACCU-CHEK GUIDE) w/Device KIT Use to check blood glucose daily (DX: type 2 DM E 11.65)   gabapentin (NEURONTIN) 600 MG tablet Take 1 tablet (600 mg total) by mouth 3 (three) times daily.   glucose blood (ACCU-CHEK GUIDE) test strip USE TO CHECK BLOOD GLUCOSE ONCE A DAY. MAY SUBSTITUTE GLUCOMETER THAT IS PREFERRED BY PATIENTS INSURANCE   hydrochlorothiazide (HYDRODIURIL) 25 MG tablet Take 1 tablet (25 mg total) by mouth daily.   levothyroxine (SYNTHROID) 50 MCG tablet TAKE 1 TABLET BY MOUTH DAILY   losartan (COZAAR) 100 MG tablet Take 1 tablet (100 mg total) by mouth daily.   magic mouthwash w/lidocaine SOLN Take 5 mLs by mouth 4 (four) times daily as needed for mouth pain. Swish and Spit   metFORMIN (GLUCOPHAGE-XR) 500 MG 24 hr tablet Take 2 tablets (1,000 mg total) by mouth daily. With largest meal.   metoprolol succinate (TOPROL-XL) 100 MG 24 hr tablet Take 1 tablet (100 mg total) by mouth daily. Take with or immediately following a meal   montelukast (SINGULAIR) 10 MG tablet Take 1 tablet (10 mg total) by mouth at bedtime.   nicotine (NICODERM CQ -  DOSED IN MG/24 HOURS) 14 mg/24hr patch Place 1 patch (14 mg total) onto the skin daily. Remove and apply new patch each morning.   NON FORMULARY [facility]  Scar Cream -  Verapamil 10%, Pentoxifylline 5% Apply 1-2 grams to affected area 3-4 times daily Qty. 120 gm 3 refills   oxybutynin (DITROPAN-XL) 10 MG 24 hr tablet Take 1 tablet (10 mg total) by mouth at bedtime.   PARoxetine (PAXIL) 40 MG tablet Take 1 tablet (40 mg total) by mouth every morning.   pravastatin (PRAVACHOL) 40 MG tablet TAKE 1 TABLET BY MOUTH ONCE DAILY FOR LOWERING CHOLESTEROL AND HEART HEALTH   Semaglutide, 1 MG/DOSE, (OZEMPIC, 1 MG/DOSE,) 4 MG/3ML SOPN Inject 1 mg into the skin once a week.   No facility-administered encounter medications on file as of 06/04/2023.       03/13/2023   12:00 PM  Montreal Cognitive Assessment   Visuospatial/ Executive (0/5) 4  Naming (0/3) 3  Attention: Read list of digits (0/2) 2  Attention: Read list of letters (0/1) 1  Attention: Serial 7 subtraction starting at 100 (0/3) 1  Language: Repeat phrase (0/2) 1  Language : Fluency (0/1) 0  Abstraction (0/2) 1  Delayed Recall (0/5) 3  Orientation (0/6) 6  Total 22  Adjusted Score (based on education) 22        No data to display           Total time spent on today's visit was 27 minutes dedicated to this patient today, preparing to see patient, examining the patient, ordering tests and/or medications and counseling the patient, documenting clinical information in the EHR or other health record, independently interpreting results and communicating results to the patient/family, discussing treatment and goals, answering patient's questions and coordinating care.  Cc:  Fredric Dine [facility] 06/04/2023 6:55 AM
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
## 2023-06-10 ENCOUNTER — Ambulatory Visit: Payer: 59

## 2023-06-10 DIAGNOSIS — R41841 Cognitive communication deficit: Secondary | ICD-10-CM | POA: Diagnosis not present

## 2023-06-10 NOTE — Patient Instructions (Addendum)
Phone Organization:  Email = press star (on right hand side of email list) to save important emails Press three bars on upper left hand side of email Select "starred" to find favorited items   Screenshot anything important to save to your camera roll/photos  Hold phone in right hand Press home circle button with left hand and lock button with your right thumb  Find the screenshot in your photos Get in the habit of deleting spam texts/emails as they come in to reduce   Brain Dump: If you're feeling overwhelmed, write down all the thoughts in your head   To-do Lists: One location - notebook, notepad 2-3 items per day (aka prioritize)  Write specific information (date, task) and try to check them off

## 2023-06-16 ENCOUNTER — Other Ambulatory Visit: Payer: Self-pay | Admitting: Family Medicine

## 2023-06-17 ENCOUNTER — Other Ambulatory Visit: Payer: Self-pay | Admitting: Family Medicine

## 2023-06-17 DIAGNOSIS — I10 Essential (primary) hypertension: Secondary | ICD-10-CM

## 2023-06-19 ENCOUNTER — Other Ambulatory Visit: Payer: Self-pay | Admitting: Family Medicine

## 2023-06-25 ENCOUNTER — Other Ambulatory Visit: Payer: Self-pay | Admitting: Family Medicine

## 2023-06-25 DIAGNOSIS — R3589 Other polyuria: Secondary | ICD-10-CM

## 2023-07-02 ENCOUNTER — Other Ambulatory Visit: Payer: Self-pay | Admitting: Family Medicine

## 2023-07-02 DIAGNOSIS — G8929 Other chronic pain: Secondary | ICD-10-CM

## 2023-07-08 ENCOUNTER — Telehealth: Payer: Self-pay | Admitting: Family Medicine

## 2023-07-08 NOTE — Telephone Encounter (Signed)
[facility] called to confirm if it's okay to change the manufacturer for pt's levothyroxine. Please respond to fax in [address] drive.

## 2023-07-08 NOTE — Telephone Encounter (Signed)
Request faxed back to [facility]

## 2023-07-29 ENCOUNTER — Encounter: Payer: Self-pay | Admitting: Family Medicine

## 2023-07-29 ENCOUNTER — Ambulatory Visit (INDEPENDENT_AMBULATORY_CARE_PROVIDER_SITE_OTHER): Payer: 59 | Admitting: Family Medicine

## 2023-07-29 VITALS — BP 120/70 | HR 78 | Resp 18 | Ht 63.0 in | Wt 154.4 lb

## 2023-07-29 DIAGNOSIS — J329 Chronic sinusitis, unspecified: Secondary | ICD-10-CM | POA: Diagnosis not present

## 2023-07-29 DIAGNOSIS — E1165 Type 2 diabetes mellitus with hyperglycemia: Secondary | ICD-10-CM | POA: Diagnosis not present

## 2023-07-29 DIAGNOSIS — J3089 Other allergic rhinitis: Secondary | ICD-10-CM | POA: Diagnosis not present

## 2023-07-29 DIAGNOSIS — E785 Hyperlipidemia, unspecified: Secondary | ICD-10-CM | POA: Diagnosis not present

## 2023-07-29 DIAGNOSIS — E1169 Type 2 diabetes mellitus with other specified complication: Secondary | ICD-10-CM

## 2023-07-29 DIAGNOSIS — E7800 Pure hypercholesterolemia, unspecified: Secondary | ICD-10-CM

## 2023-07-29 DIAGNOSIS — G4733 Obstructive sleep apnea (adult) (pediatric): Secondary | ICD-10-CM

## 2023-07-29 DIAGNOSIS — I10 Essential (primary) hypertension: Secondary | ICD-10-CM | POA: Diagnosis not present

## 2023-07-29 DIAGNOSIS — F411 Generalized anxiety disorder: Secondary | ICD-10-CM | POA: Diagnosis not present

## 2023-07-29 DIAGNOSIS — E039 Hypothyroidism, unspecified: Secondary | ICD-10-CM

## 2023-07-29 DIAGNOSIS — T7840XD Allergy, unspecified, subsequent encounter: Secondary | ICD-10-CM

## 2023-07-29 MED ORDER — PAROXETINE HCL 40 MG PO TABS
40.0000 mg | ORAL_TABLET | Freq: Every morning | ORAL | 3 refills | Status: DC
Start: 2023-07-29 — End: 2024-08-16

## 2023-07-29 MED ORDER — FLUTICASONE PROPIONATE 50 MCG/ACT NA SUSP
2.0000 | Freq: Every day | NASAL | 6 refills | Status: AC
Start: 2023-07-29 — End: ?

## 2023-07-29 MED ORDER — DOXYCYCLINE HYCLATE 100 MG PO TABS: 100.0000 mg | ORAL_TABLET | Freq: Two times a day (BID) | ORAL | 0 refills | Status: DC

## 2023-07-29 MED ORDER — AZELASTINE HCL 0.1 % NA SOLN
2.0000 | Freq: Two times a day (BID) | NASAL | 5 refills | Status: AC
Start: 2023-07-29 — End: ?

## 2023-07-29 NOTE — Assessment & Plan Note (Signed)
Tolerating statin, encouraged heart healthy diet, avoid trans fats, minimize simple carbs and saturated fats. Increase exercise as tolerated 

## 2023-07-29 NOTE — Assessment & Plan Note (Signed)
Well controlled, no changes to meds. Encouraged heart healthy diet such as the DASH diet and exercise as tolerated.  °
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
## 2023-07-29 NOTE — Assessment & Plan Note (Signed)
hgba1c to be checked, minimize simple carbs. Increase exercise as tolerated. Continue current meds  

## 2023-07-29 NOTE — Patient Instructions (Signed)

## 2023-07-30 LAB — CBC WITH DIFFERENTIAL/PLATELET
Basophils Absolute: 0.1 10*3/uL (ref 0.0–0.1)
Basophils Relative: 1.5 % (ref 0.0–3.0)
Eosinophils Absolute: 0.1 10*3/uL (ref 0.0–0.7)
Eosinophils Relative: 1.7 % (ref 0.0–5.0)
HCT: 34.4 % — ABNORMAL LOW (ref 36.0–46.0)
Hemoglobin: 11.3 g/dL — ABNORMAL LOW (ref 12.0–15.0)
Lymphocytes Relative: 37.3 % (ref 12.0–46.0)
Lymphs Abs: 2.6 10*3/uL (ref 0.7–4.0)
MCHC: 32.8 g/dL (ref 30.0–36.0)
MCV: 85.3 fL (ref 78.0–100.0)
Monocytes Absolute: 0.7 10*3/uL (ref 0.1–1.0)
Monocytes Relative: 9.7 % (ref 3.0–12.0)
Neutro Abs: 3.4 10*3/uL (ref 1.4–7.7)
Neutrophils Relative %: 49.8 % (ref 43.0–77.0)
Platelets: 253 10*3/uL (ref 150.0–400.0)
RBC: 4.03 Mil/uL (ref 3.87–5.11)
RDW: 14.3 % (ref 11.5–15.5)
WBC: 6.9 10*3/uL (ref 4.0–10.5)

## 2023-07-30 LAB — LIPID PANEL
Cholesterol: 153 mg/dL (ref 0–200)
HDL: 51.1 mg/dL (ref >39.00)
LDL Cholesterol: 88 mg/dL (ref 0–99)
NonHDL: 102.01
Total CHOL/HDL Ratio: 3
Triglycerides: 71 mg/dL (ref 0.0–149.0)
VLDL: 14.2 mg/dL (ref 0.0–40.0)

## 2023-07-30 LAB — COMPREHENSIVE METABOLIC PANEL
ALT: 12 U/L (ref 0–35)
AST: 18 U/L (ref 0–37)
Albumin: 3.9 g/dL (ref 3.5–5.2)
Alkaline Phosphatase: 87 U/L (ref 39–117)
BUN: 13 mg/dL (ref 6–23)
CO2: 30 meq/L (ref 19–32)
Calcium: 9.7 mg/dL (ref 8.4–10.5)
Chloride: 101 meq/L (ref 96–112)
Creatinine, Ser: 0.73 mg/dL (ref 0.40–1.20)
GFR: 84.75 mL/min (ref >60.00)
Glucose, Bld: 85 mg/dL (ref 70–99)
Potassium: 4.2 meq/L (ref 3.5–5.1)
Sodium: 136 meq/L (ref 135–145)
Total Bilirubin: 0.6 mg/dL (ref 0.2–1.2)
Total Protein: 7.2 g/dL (ref 6.0–8.3)

## 2023-07-30 LAB — HEMOGLOBIN A1C: Hgb A1c MFr Bld: 6.2 % (ref 4.6–6.5)

## 2023-07-30 LAB — TSH: TSH: 1.42 u[IU]/mL (ref 0.35–5.50)

## 2023-08-19 ENCOUNTER — Other Ambulatory Visit: Payer: Self-pay | Admitting: Family Medicine

## 2023-08-19 DIAGNOSIS — I10 Essential (primary) hypertension: Secondary | ICD-10-CM

## 2023-08-21 ENCOUNTER — Encounter (INDEPENDENT_AMBULATORY_CARE_PROVIDER_SITE_OTHER): Payer: Self-pay | Admitting: Otolaryngology

## 2023-08-25 ENCOUNTER — Other Ambulatory Visit: Payer: Self-pay | Admitting: Family Medicine

## 2023-08-25 DIAGNOSIS — R3589 Other polyuria: Secondary | ICD-10-CM

## 2023-08-25 DIAGNOSIS — T7840XD Allergy, unspecified, subsequent encounter: Secondary | ICD-10-CM

## 2023-08-26 DIAGNOSIS — G4733 Obstructive sleep apnea (adult) (pediatric): Secondary | ICD-10-CM | POA: Diagnosis not present

## 2023-08-26 DIAGNOSIS — G2581 Restless legs syndrome: Secondary | ICD-10-CM | POA: Diagnosis not present

## 2023-09-01 ENCOUNTER — Other Ambulatory Visit: Payer: Self-pay | Admitting: Family Medicine

## 2023-09-01 DIAGNOSIS — E039 Hypothyroidism, unspecified: Secondary | ICD-10-CM

## 2023-09-02 ENCOUNTER — Telehealth: Payer: Self-pay | Admitting: Family Medicine

## 2023-09-02 DIAGNOSIS — E039 Hypothyroidism, unspecified: Secondary | ICD-10-CM

## 2023-09-02 MED ORDER — LEVOTHYROXINE SODIUM 50 MCG PO TABS: 50.0000 ug | ORAL_TABLET | Freq: Every day | ORAL | 2 refills | Status: DC

## 2023-09-02 NOTE — Addendum Note (Signed)
Addended by: Roxanne Gates on: 09/02/2023 01:22 PM   Modules accepted: Orders

## 2023-09-02 NOTE — Telephone Encounter (Signed)
Prescription Request  09/02/2023  Is this a "Controlled Substance" medicine? No  LOV: 07/29/2023  What is the name of the medication or equipment? levothyroxine (SYNTHROID) 50 MCG tablet  Have you contacted your pharmacy to request a refill? Yes   Which pharmacy would you like this sent to?  [id number] - [place], [place] - [address] [place] [address] Phone: [phone] Fax: [phone]     Patient notified that their request is being sent to the clinical staff for review and that they should receive a response within 2 business days.   Please advise at [facility] [phone]

## 2023-09-02 NOTE — Telephone Encounter (Signed)
Rx sent 

## 2023-09-04 ENCOUNTER — Other Ambulatory Visit: Payer: Self-pay | Admitting: Family Medicine

## 2023-09-04 DIAGNOSIS — G8929 Other chronic pain: Secondary | ICD-10-CM

## 2023-09-20 ENCOUNTER — Other Ambulatory Visit: Payer: Self-pay | Admitting: Family Medicine

## 2023-09-24 ENCOUNTER — Other Ambulatory Visit: Payer: Self-pay | Admitting: Family Medicine

## 2023-10-06 ENCOUNTER — Encounter: Payer: 59 | Attending: Family Medicine | Admitting: Dietician

## 2023-10-06 ENCOUNTER — Ambulatory Visit: Payer: 59 | Admitting: Dietician

## 2023-10-06 DIAGNOSIS — E1165 Type 2 diabetes mellitus with hyperglycemia: Secondary | ICD-10-CM | POA: Diagnosis not present

## 2023-10-06 NOTE — Patient Instructions (Addendum)
1-[email or web address]  BBQ Chicken Pizza & Roasted Veggies Registered for  Tuesday, December 17 at 4pm PT / 7pm ET 2- Aim for balanced meal and snacks  1/2 plate of non starchy vegetables, 1/4 protein, 1/4 carbohydrate

## 2023-10-06 NOTE — Progress Notes (Signed)
Diabetes Self-Management Education  Visit Type: First/Initial  Appt. Start Time: 1420 Appt. End Time: 1523  10/06/2023  [name], identified by name and date of birth, is a 67 y.o. female with a diagnosis of Diabetes: Type 2.   ASSESSMENT  Patient is here today alone. Patient would like to learn more about recipes and diabetic friendly cooking classes. Patient lives with alone.  Pt does the shopping and cooking.Pt states feeling "depressed"- RD provided mental health resources. Pt reports an decrease physical activity stating she enjoys the gym and walking. Pt reports she feels like she snacks more when she feels depressed. Pt reports recent blood sugar values obtained range "85-130's". Pt reports concern about un healthy routine and choices around the holidays. Pt reports she is attempting increases in her water intake and states this is challenging.   History includes:   Past Medical History:  Diagnosis Date   Abdominal pain, right upper quadrant 04/30/2007   Allergies 02/03/2019   Allergy    Anemia, iron deficiency 04/17/2017   Aortic atherosclerosis 10/10/2017   Atypical chest pain 08/31/2014   Back pain with radiculopathy 01/25/2010   Check xray --- ? Cause of leg weakness   Chronic pain of both shoulders 10/31/2020   Constipation    DDD (degenerative disc disease)    Diabetes mellitus type II, uncontrolled 01/11/2020   Diastolic dysfunction 02/15/2010   Epilepsy    Childhood seziures; pt reports that she grew out of them and they have not occurred since childhood   Essential hypertension 08/31/2010   Poorly controlled will alter medications, encouraged DASH diet, minimize caffeine and obtain adequate sleep. Report concerning symptoms and follow up as directed and as needed [employer] metoprolol Edema may be c   Fatigue 01/25/2010   Generalized anxiety disorder 04/22/2018   Stable co'nt meds   GERD (gastroesophageal reflux disease) 07/16/2007   Hearing loss  07/24/2012   Refer to hearing clinic   Hiatal hernia 07/16/2007   Hyperglycemia, fasting 05/04/2010   Hyperlipidemia 04/17/2017   Encouraged heart healthy diet, increase exercise, avoid trans fats, consider a krill oil cap daily   Hypothyroidism 11/02/2010   con't meds; Lab Results Component Value TSH 1.44 08/12/2017   Insomnia 04/30/2007   Knee pain 07/16/2007   Leg pain, bilateral 11/17/2008   Low back pain 07/16/2007   Major depressive disorder 01/30/2018   con't meds F/u counselor   Mixed connective tissue disease    with Raynaud's   Obstructive sleep apnea 01/30/2018   F/u with pulm; May be cause of some of her memory/concentration problems   Osteoarthritis    Osteoporosis    Palpitations 02/12/2008   Pansinusitis 01/30/2018   Peripheral vertigo 04/30/2007   Resolved rto prn   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   Restless legs syndrome 12/01/2008   Stomach ulcer    Swallowing difficulty    Upper respiratory tract infection 12/14/2019   Urge incontinence of urine 10/06/2020   Vitamin D deficiency     Labs noted:   Lab Results  Component Value Date   HGBA1C 6.2 07/29/2023   Lab Results  Component Value Date   CHOL 153 07/29/2023   HDL 51.10 07/29/2023   LDLCALC 88 07/29/2023   LDLDIRECT 126.3 06/06/2011   TRIG 71.0 07/29/2023   CHOLHDL 3 07/29/2023    Medications include:   Current Outpatient Medications:    Accu-Chek Softclix Lancets lancets, USE TO CHECK BLOOD GLUCOSE ONCE A DAY, Disp: 100 each, Rfl: 12   azelastine (  ASTELIN) 0.1 % nasal spray, Place 2 sprays into both nostrils 2 (two) times daily. Use in each nostril as directed, Disp: 30 mL, Rfl: 5   Blood Glucose Monitoring Suppl (ACCU-CHEK GUIDE) w/Device KIT, Use to check blood glucose daily (DX: type 2 DM E 11.65), Disp: 1 kit, Rfl: 0   donepezil (ARICEPT) 5 MG tablet, Take 1 tablet (5 mg total) by mouth daily., Disp: 30 tablet, Rfl: 11   gabapentin (NEURONTIN) 600 MG tablet, TAKE 1 TABLET BY MOUTH 3 TIMES   DAILY, Disp: 270 tablet, Rfl: 1   glucose blood (ACCU-CHEK GUIDE) test strip, USE TO CHECK BLOOD GLUCOSE ONCE A DAY. MAY SUBSTITUTE GLUCOMETER THAT IS PREFERRED BY PATIENTS INSURANCE, Disp: 100 each, Rfl: 2   hydrochlorothiazide (HYDRODIURIL) 25 MG tablet, Take 1 tablet (25 mg total) by mouth daily., Disp: 100 tablet, Rfl: 2   levothyroxine (SYNTHROID) 50 MCG tablet, Take 1 tablet (50 mcg total) by mouth daily., Disp: 100 tablet, Rfl: 2   losartan (COZAAR) 100 MG tablet, Take 1 tablet (100 mg total) by mouth daily., Disp: 100 tablet, Rfl: 2   montelukast (SINGULAIR) 10 MG tablet, Take 1 tablet (10 mg total) by mouth at bedtime., Disp: 90 tablet, Rfl: 0   nicotine (NICODERM CQ - DOSED IN MG/24 HOURS) 14 mg/24hr patch, Place 1 patch (14 mg total) onto the skin daily. Remove and apply new patch each morning., Disp: 28 patch, Rfl: 0   OZEMPIC, 1 MG/DOSE, 4 MG/3ML SOPN, INJECT 1 MG INTO THE SKIN ONCE A WEEK, Disp: 3 mL, Rfl: 0   PARoxetine (PAXIL) 40 MG tablet, Take 1 tablet (40 mg total) by mouth every morning., Disp: 90 tablet, Rfl: 3   pravastatin (PRAVACHOL) 40 MG tablet, Take 1 tablet (40 mg total) by mouth daily., Disp: 90 tablet, Rfl: 0   doxycycline (VIBRA-TABS) 100 MG tablet, Take 1 tablet (100 mg total) by mouth 2 (two) times daily., Disp: 20 tablet, Rfl: 0   fluticasone (FLONASE) 50 MCG/ACT nasal spray, Place 2 sprays into both nostrils daily., Disp: 16 g, Rfl: 6   magic mouthwash w/lidocaine SOLN, Take 5 mLs by mouth 4 (four) times daily as needed for mouth pain. Swish and Spit (Patient not taking: Reported on 10/06/2023), Disp: 240 mL, Rfl: 0   metFORMIN (GLUCOPHAGE-XR) 500 MG 24 hr tablet, Take 2 tablets (1,000 mg total) by mouth daily. With largest meal. (Patient not taking: Reported on 10/06/2023), Disp: 180 tablet, Rfl: 3   metoprolol succinate (TOPROL-XL) 100 MG 24 hr tablet, Take 1 tablet (100 mg total) by mouth daily. Take with or immediately following a meal, Disp: 90 tablet, Rfl: 0    NON FORMULARY, [facility]  Scar Cream -  Verapamil 10%, Pentoxifylline 5% Apply 1-2 grams to affected area 3-4 times daily Qty. 120 gm 3 refills, Disp: , Rfl:    oxybutynin (DITROPAN-XL) 10 MG 24 hr tablet, Take 1 tablet (10 mg total) by mouth at bedtime., Disp: 90 tablet, Rfl: 0   There were no vitals taken for this visit. There is no height or weight on file to calculate BMI.   Diabetes Self-Management Education - 10/06/23 1434       Visit Information   Visit Type First/Initial      Initial Visit   Diabetes Type Type 2    Date Diagnosed 2019    Are you currently following a meal plan? No    Are you taking your medications as prescribed? No      Health Coping  How would you rate your overall health? Fair      Psychosocial Assessment   Patient Belief/Attitude about Diabetes Motivated to manage diabetes    What is the hardest part about your diabetes right now, causing you the most concern, or is the most worrisome to you about your diabetes?   Making healty food and beverage choices;Being active;Getting support / problem solving    Self-care barriers Other (comment)   memory impariment   Self-management support Doctor's office    Other persons present Patient    Patient Concerns Nutrition/Meal planning    Special Needs Unable to determine    Learning Readiness Ready    How often do you need to have someone help you when you read instructions, pamphlets, or other written materials from your doctor or pharmacy? 2 - Rarely    What is the last grade level you completed in school? college      Pre-Education Assessment   Patient understands the diabetes disease and treatment process. Comprehends key points    Patient understands incorporating nutritional management into lifestyle. Needs Instruction    Patient undertands incorporating physical activity into lifestyle. Comprehends key points    Patient understands using medications safely. Needs Review    Patient understands  monitoring blood glucose, interpreting and using results Needs Review    Patient understands prevention, detection, and treatment of acute complications. Needs Instruction    Patient understands prevention, detection, and treatment of chronic complications. Needs Review    Patient understands how to develop strategies to address psychosocial issues. Needs Instruction    Patient understands how to develop strategies to promote health/change behavior. Needs Instruction      Complications   Last HgB A1C per patient/outside source 6.2 %    How often do you check your blood sugar? 0 times/day (not testing)    Postprandial Blood glucose range (mg/dL) 16-109    Number of hypoglycemic episodes per month 0    Number of hyperglycemic episodes ( >200mg /dL): --   unknown   Have you had a dilated eye exam in the past 12 months? Yes    Have you had a dental exam in the past 12 months? Yes    Are you checking your feet? Yes    How many days per week are you checking your feet? 7   instructed daily foot checks     Dietary Intake   Breakfast apple cinammon 1/2 packet with old fashoin oats mixed ~ 1 cup, cinnamon crackers 4, coffee plain    Snack (morning) graham crackers with peanut butter aka nabs or yougurt    Dinner ~6-9pm: chicken, greens, potato salad    Snack (evening) trail mix or chex mix    Beverage(s) coffee plain, diet caffeine free soda, water      Activity / Exercise   Activity / Exercise Type Light (walking / raking leaves)    How many days per week do you exercise? 0    How many minutes per day do you exercise? 60    Total minutes per week of exercise 0      Patient Education   Previous Diabetes Education Yes (please comment)    Disease Pathophysiology Definition of diabetes, type 1 and 2, and the diagnosis of diabetes    Healthy Eating Plate Method;Other (comment)   register for ADA cooking class   Being Active Role of exercise on diabetes management, blood pressure control and  cardiac health.    Medications Reviewed patients medication for  diabetes, action, purpose, timing of dose and side effects.    Monitoring Identified appropriate SMBG and/or A1C goals.    Acute complications Taught prevention, symptoms, and  treatment of hypoglycemia - the 15 rule.    Chronic complications Retinopathy and reason for yearly dilated eye exams    Diabetes Stress and Support Identified and addressed patients feelings and concerns about diabetes    Lifestyle and Health Coping Lifestyle issues that need to be addressed for better diabetes care      Individualized Goals (developed by patient)   Nutrition Follow meal plan discussed    Physical Activity Exercise 1-2 times per week;30 minutes per day    Medications take my medication as prescribed    Monitoring  Test my blood glucose as discussed    Problem Solving Eating Pattern;Medication consistency;Addressing barriers to behavior change    Reducing Risk do foot checks daily    Health Coping Ask for help with psychological, social, or emotional issues      Post-Education Assessment   Patient understands the diabetes disease and treatment process. Comprehends key points    Patient understands incorporating nutritional management into lifestyle. Needs Review    Patient undertands incorporating physical activity into lifestyle. Comprehends key points    Patient understands using medications safely. Comphrehends key points    Patient understands monitoring blood glucose, interpreting and using results Comprehends key points    Patient understands prevention, detection, and treatment of acute complications. Comprehends key points    Patient understands prevention, detection, and treatment of chronic complications. Needs Review    Patient understands how to develop strategies to address psychosocial issues. Needs Review    Patient understands how to develop strategies to promote health/change behavior. Comprehends key points       Outcomes   Expected Outcomes Demonstrated interest in learning. Expect positive outcomes    Future DMSE 2 months    Program Status Not Completed             Individualized Plan for Diabetes Self-Management Training:   Learning Objective:  Patient will have a greater understanding of diabetes self-management. Patient education plan is to attend individual and/or group sessions per assessed needs and concerns.   Plan:   Patient Instructions  [email or web address]  BBQ Chicken Pizza & Roasted Veggies Registered for  Tuesday, December 17 at 4pm PT / 7pm ET 2- Aim for balanced meal and snacks  1/2 plate of non starchy vegetables, 1/4 protein, 1/4 carbohydrate  Expected Outcomes:  Demonstrated interest in learning. Expect positive outcomes  Education material provided: ADA - How to Thrive: A Guide for Your Journey with Diabetes, My Plate, Snack sheet, and Diabetes Resources  If problems or questions, patient to contact team via:  Phone  Future DSME appointment: 2 months

## 2023-10-16 ENCOUNTER — Other Ambulatory Visit: Payer: Self-pay | Admitting: Family Medicine

## 2023-10-20 ENCOUNTER — Other Ambulatory Visit: Payer: Self-pay | Admitting: Family Medicine

## 2023-10-20 DIAGNOSIS — I10 Essential (primary) hypertension: Secondary | ICD-10-CM

## 2023-11-15 ENCOUNTER — Other Ambulatory Visit: Payer: Self-pay | Admitting: Family Medicine

## 2023-11-15 DIAGNOSIS — G8929 Other chronic pain: Secondary | ICD-10-CM

## 2023-11-26 ENCOUNTER — Telehealth: Payer: Self-pay | Admitting: Family Medicine

## 2023-11-26 NOTE — Telephone Encounter (Signed)
Copied from CRM [id number]. Topic: General - Call Back - No Documentation >> Nov 26, 2023  4:15 PM Samuel Jester B wrote: Reason for CRM: Pt would like for her pcp nurse to give her a call

## 2023-12-01 ENCOUNTER — Ambulatory Visit: Payer: Self-pay | Admitting: Family Medicine

## 2023-12-01 NOTE — Telephone Encounter (Signed)
  Chief Complaint: Hyperglycemia Symptoms: recent falls, "feeling off" Frequency: Ongoing  Pertinent Negatives: Patient denies fever, N/V Disposition: [] ED /[] Urgent Care (no appt availability in office) / [x] Appointment(In office/virtual)/ []  [facility]/ [] Home Care/ [] Refused Recommended Disposition /[] [place] [facility]/ []  Follow-up with PCP Additional Notes: Pt reports she has recently experiencing what feels like high blood sugars but has been unable to check her readings as she does not know where her meter is. Pt reports feeling "off", two falls in the last two weeks, fatigue. Pt denies N, reports vomiting a couple weeks ago which has resolved. Pt denies fever, abd pain. OV scheduled tomorrow. This RN educated pt on home care, new-worsening symptoms, when to call back/seek emergent care. Pt verbalized understanding and agrees to plan.    Copied from CRM [id number]. Topic: Clinical - Red Word Triage >> Dec 01, 2023  2:41 PM Gurney Maxin H wrote: [facility] that prompted transfer to Nurse Triage: Sugar has been up, been off balance, very fatigue, fell twice recently. Had pain in chest, arm and leg after fall. Reason for Disposition  [1] Symptoms of high blood sugar (e.g., abnormally thirsty, frequent urination, weight loss) AND [2] not able to test blood glucose  Answer Assessment - Initial Assessment Questions 1. BLOOD GLUCOSE: "What is your blood glucose level?"      Unknown 3. USUAL RANGE: "What is your glucose level usually?" (e.g., usual fasting morning value, usual evening value)     Unknown 5. TYPE 1 or 2:  "Do you know what type of diabetes you have?"  (e.g., Type 1, Type 2, Gestational; doesn't know)      Type 2 6. INSULIN: "Do you take insulin?" "What type of insulin(s) do you use? What is the mode of delivery? (syringe, pen; injection or pump)?"      None 7. DIABETES PILLS: "Do you take any pills for your diabetes?" If Yes, ask: "Have you missed taking any pills  recently?"     Ozempic 8. OTHER SYMPTOMS: "Do you have any symptoms?" (e.g., fever, frequent urination, difficulty breathing, dizziness, weakness, vomiting)     Fallen twice, fatigue, vomiting  Protocols used: Diabetes - High Blood Sugar-A-AH

## 2023-12-02 ENCOUNTER — Encounter: Payer: Self-pay | Admitting: Family Medicine

## 2023-12-02 ENCOUNTER — Ambulatory Visit (INDEPENDENT_AMBULATORY_CARE_PROVIDER_SITE_OTHER): Payer: 59 | Admitting: Family Medicine

## 2023-12-02 VITALS — BP 130/88 | HR 75 | Temp 97.9°F | Resp 16 | Ht 63.0 in | Wt 157.2 lb

## 2023-12-02 DIAGNOSIS — Z7985 Long-term (current) use of injectable non-insulin antidiabetic drugs: Secondary | ICD-10-CM | POA: Diagnosis not present

## 2023-12-02 DIAGNOSIS — G4733 Obstructive sleep apnea (adult) (pediatric): Secondary | ICD-10-CM

## 2023-12-02 DIAGNOSIS — L853 Xerosis cutis: Secondary | ICD-10-CM | POA: Diagnosis not present

## 2023-12-02 DIAGNOSIS — R5383 Other fatigue: Secondary | ICD-10-CM | POA: Diagnosis not present

## 2023-12-02 DIAGNOSIS — E1165 Type 2 diabetes mellitus with hyperglycemia: Secondary | ICD-10-CM

## 2023-12-02 DIAGNOSIS — I10 Essential (primary) hypertension: Secondary | ICD-10-CM | POA: Diagnosis not present

## 2023-12-02 DIAGNOSIS — E1169 Type 2 diabetes mellitus with other specified complication: Secondary | ICD-10-CM | POA: Diagnosis not present

## 2023-12-02 DIAGNOSIS — E039 Hypothyroidism, unspecified: Secondary | ICD-10-CM

## 2023-12-02 DIAGNOSIS — E785 Hyperlipidemia, unspecified: Secondary | ICD-10-CM

## 2023-12-02 DIAGNOSIS — D649 Anemia, unspecified: Secondary | ICD-10-CM | POA: Diagnosis not present

## 2023-12-02 DIAGNOSIS — N3946 Mixed incontinence: Secondary | ICD-10-CM

## 2023-12-02 MED ORDER — SACCHAROMYCES BOULARDII 250 MG PO CAPS: 250.0000 mg | ORAL_CAPSULE | Freq: Two times a day (BID) | ORAL | 5 refills | Status: AC

## 2023-12-02 MED ORDER — AMMONIUM LACTATE 12 % EX LOTN: 1.0000 | TOPICAL_LOTION | CUTANEOUS | 0 refills | Status: AC | PRN

## 2023-12-02 NOTE — Progress Notes (Signed)
 Established Patient Office Visit  Subjective   Patient ID: Debra [name], female    DOB: 1956/06/22  Age: 68 y.o. MRN: [id number]  Chief Complaint  Patient presents with   Diabetes   Follow-up    HPI Discussed the use of AI scribe software for clinical note transcription with the patient, who gave verbal consent to proceed.  History of Present Illness   Debra [name] is a 67 year old female with diabetes who presents with fatigue and gastrointestinal symptoms.  Fatigue has been present for the past two weeks, significantly impacting her ability to perform daily activities such as cooking and cleaning. She describes a lack of energy and has had difficulty remembering to take her medications. She has also experienced frequent falls, attributing some to rushing and one to slipping on ice, resulting in knee pain.  Gastrointestinal symptoms began around the time of a recent snowfall, approximately two weeks ago, with two episodes of significant vomiting. She continues to experience stomach discomfort, a lack of appetite, and possible constipation. She uses Metamucil and occasionally consumes Greek yogurt for probiotics. Bowel movements are infrequent and unsatisfactory.  She has a history of diabetes and has been checking her blood sugars intermittently, with recent readings ranging from 119 to 176 mg/dL. Fatigue and forgetfulness have made diabetes management challenging.  Urinary incontinence persists despite taking oxybutynin , which helps at night but not during the day. She has attempted pelvic floor exercises but is unsure of their effectiveness.  She reports dry, itchy skin, particularly on her legs, and has tried various lotions with limited relief. Working in a cold environment may exacerbate her skin condition.  Emotional distress is present due to family issues, particularly her son's decision not to allow family presence during the birth of her grandchild, which has upset her.  She has not yet visited her new grandchild in [place].      Patient Active Problem List   Diagnosis Date Noted   Memory impairment 06/04/2023   Type 2 diabetes mellitus with hyperglycemia, without long-term current use of insulin  (HCC) 08/09/2022   Snapping lateral band due to ligament laxity 02/19/2022   Moderate episode of recurrent major depressive disorder (HCC) 01/01/2022   Polyuria 01/01/2022   Mixed stress and urge urinary incontinence 01/01/2022   Wound infection 10/04/2021   Wound of right ankle 09/27/2021   Trigger thumb, right thumb 09/13/2021   Pruritic dermatitis 05/25/2021   Vaginal discharge 05/25/2021   Balance disorder 05/25/2021   Dizziness 05/25/2021   Osteoarthritis    Vitamin D  deficiency    Urinary frequency 10/31/2020   Chronic pain of both shoulders 10/31/2020   Female pattern baldness 10/06/2020   Myalgia 10/06/2020   Urge incontinence of urine 10/06/2020   History of diabetes mellitus, type II 01/11/2020   Allergies 02/03/2019   Acute vaginitis 04/22/2018   Morbid obesity 04/22/2018   Generalized anxiety disorder 04/22/2018   Obstructive sleep apnea 01/30/2018   Major depressive disorder 01/30/2018   Pansinusitis 01/30/2018   Aortic atherosclerosis 10/10/2017   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   Hyperlipidemia 04/17/2017   Anemia, iron deficiency 04/17/2017   Atypical chest pain 08/31/2014   Arm weakness 08/31/2014   Hearing loss 07/24/2012   Hypothyroidism 11/02/2010   Essential hypertension 08/31/2010   Hyperglycemia, fasting 05/04/2010   Diastolic dysfunction 02/15/2010   Back pain with radiculopathy 01/25/2010   Fatigue 01/25/2010   Tobacco abuse 12/01/2008   Restless legs syndrome 12/01/2008   Leg pain, bilateral 11/17/2008  Palpitations 02/12/2008   GERD (gastroesophageal reflux disease) 07/16/2007   Hiatal hernia 07/16/2007   Knee pain 07/16/2007   Low back pain 07/16/2007   Peripheral vertigo 04/30/2007   Insomnia 04/30/2007    Abdominal pain, right upper quadrant 04/30/2007   Past Medical History:  Diagnosis Date   Abdominal pain, right upper quadrant 04/30/2007   Allergies 02/03/2019   Allergy    Anemia, iron deficiency 04/17/2017   Aortic atherosclerosis 10/10/2017   Atypical chest pain 08/31/2014   Back pain with radiculopathy 01/25/2010   Check xray --- ? Cause of leg weakness   Chronic pain of both shoulders 10/31/2020   Constipation    DDD (degenerative disc disease)    Diabetes mellitus type II, uncontrolled 01/11/2020   Diastolic dysfunction 02/15/2010   Epilepsy    Childhood seziures; pt reports that she grew out of them and they have not occurred since childhood   Essential hypertension 08/31/2010   Poorly controlled will alter medications, encouraged DASH diet, minimize caffeine and obtain adequate sleep. Report concerning symptoms and follow up as directed and as needed [employer] metoprolol  Edema may be c   Fatigue 01/25/2010   Generalized anxiety disorder 04/22/2018   Stable co'nt meds   GERD (gastroesophageal reflux disease) 07/16/2007   Hearing loss 07/24/2012   Refer to hearing clinic   Hiatal hernia 07/16/2007   Hyperglycemia, fasting 05/04/2010   Hyperlipidemia 04/17/2017   Encouraged heart healthy diet, increase exercise, avoid trans fats, consider a krill oil cap daily   Hypothyroidism 11/02/2010   con't meds; Lab Results Component Value TSH 1.44 08/12/2017   Insomnia 04/30/2007   Knee pain 07/16/2007   Leg pain, bilateral 11/17/2008   Low back pain 07/16/2007   Major depressive disorder 01/30/2018   con't meds F/u counselor   Mixed connective tissue disease    with Raynaud's   Obstructive sleep apnea 01/30/2018   F/u with pulm; May be cause of some of her memory/concentration problems   Osteoarthritis    Osteoporosis    Palpitations 02/12/2008   Pansinusitis 01/30/2018   Peripheral vertigo 04/30/2007   Resolved rto prn   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   Social History   Tobacco Use   Smoking status: Every Day    Current packs/day: 0.75    Average packs/day: 0.8 packs/day for 44.0 years (33.0 ttl pk-yrs)    Types: Cigarettes   Smokeless tobacco: Never  Substance Use Topics   Alcohol use: Yes    Comment: 2-3 drinks per week (beer/wine)   Drug use: No   Social History   Socioeconomic History   Marital status: Legally Separated    Spouse name: Not on file   Number of children: 3   Years of education: 14   Highest education level: Some college, no degree  Occupational History   Occupation: Security  Tobacco Use   Smoking status: Every Day    Current packs/day: 0.75    Average packs/day: 0.8  packs/day for 44.0 years (33.0 ttl pk-yrs)    Types: Cigarettes   Smokeless tobacco: Never  Substance and Sexual Activity   Alcohol use: Yes    Comment: 2-3 drinks per week (beer/wine)   Drug use: No   Sexual activity: Yes    Partners: Male    Birth control/protection: Post-menopausal  Other Topics Concern   Not on file  Social History Narrative   Lives alone   Currently working   Drinks caffeine   Two floor home   ambidextrous   Social Drivers of Health   Financial Resource Strain: Medium Risk (07/02/2022)   Overall Financial Resource Strain (CARDIA)    Difficulty of Paying Living Expenses: Somewhat hard  Food Insecurity: No Food Insecurity (10/06/2023)   Hunger Vital Sign    Worried About Running Out of Food in the Last Year: Never true    Ran Out of Food in the Last Year: Never true  Transportation Needs: No  Transportation Needs (08/26/2023)   Received from [employer]    In the past 12 months, has lack of reliable transportation kept you from medical appointments, meetings, work or from getting things needed for daily living? : No  Physical Activity: Insufficiently Active (04/17/2022)   Exercise Vital Sign    Days of Exercise per Week: 1 day    Minutes of Exercise per Session: 40 min  Stress: Stress Concern Present (08/19/2022)   [employer] of Occupational Health - Occupational Stress Questionnaire    Feeling of Stress : Rather much  Social Connections: Moderately Isolated (12/28/2021)   Social Connection and Isolation Panel [NHANES]    Frequency of Communication with Friends and Family: More than three times a week    Frequency of Social Gatherings with Friends and Family: Once a week    Attends Religious Services: More than 4 times per year    Active Member of [employer] or Organizations: No    Attends Banker Meetings: Never    Marital Status: Never married  Intimate Partner Violence: Not At Risk (12/28/2021)   Humiliation, Afraid, Rape, and Kick questionnaire    Fear of Current or Ex-Partner: No    Emotionally Abused: No    Physically Abused: No    Sexually Abused: No   Family Status  Relation Name Status   Mother  Deceased at age 39   Father  Deceased at age 32   Sister  Alive   Youth Worker  (Not Specified)   MGF  Deceased   Neg Hx  (Not Specified)  No partnership data on file   Family History  Problem Relation Age of Onset   Breast cancer Mother        possibly 6's   Arthritis Mother        rheumatoid   Cancer Mother        breast   Heart disease Mother 59       MI   Hyperlipidemia Mother    Thyroid  disease Mother    Depression Mother    Anxiety disorder Mother    Heart disease Father        MI   Hypertension Father    Stroke Father    Diabetes Sister    Hypertension Sister    Hyperlipidemia Sister    Memory loss Maternal Aunt     Colon cancer Maternal Grandfather    Esophageal cancer Neg Hx    Rectal cancer Neg Hx    Stomach cancer Neg Hx  Allergies  Allergen Reactions   Penicillins    Codeine Rash and Other (See Comments)    dizziness      Review of Systems  Constitutional:  Negative for chills, fever and malaise/fatigue.  HENT:  Negative for congestion and hearing loss.   Eyes:  Negative for discharge.  Respiratory:  Negative for cough, sputum production and shortness of breath.   Cardiovascular:  Negative for chest pain, palpitations and leg swelling.  Gastrointestinal:  Negative for abdominal pain, blood in stool, constipation, diarrhea, heartburn, nausea and vomiting.  Genitourinary:  Negative for dysuria, frequency, hematuria and urgency.  Musculoskeletal:  Negative for back pain, falls and myalgias.  Skin:  Negative for rash.  Neurological:  Negative for dizziness, sensory change, loss of consciousness, weakness and headaches.  Endo/Heme/Allergies:  Negative for environmental allergies. Does not bruise/bleed easily.  Psychiatric/Behavioral:  Negative for depression and suicidal ideas. The patient is not nervous/anxious and does not have insomnia.       Objective:     BP 130/88 (BP Location: Left Arm, Patient Position: Sitting, Cuff Size: Normal)   Pulse 75   Temp 97.9 F (36.6 C) (Oral)   Resp 16   Ht 5' 3 (1.6 m)   Wt 157 lb 3.2 oz (71.3 kg)   SpO2 98%   BMI 27.85 kg/m  BP Readings from Last 3 Encounters:  12/02/23 130/88  07/29/23 120/70  06/04/23 109/70   Wt Readings from Last 3 Encounters:  12/02/23 157 lb 3.2 oz (71.3 kg)  07/29/23 154 lb 6.4 oz (70 kg)  06/04/23 155 lb (70.3 kg)   SpO2 Readings from Last 3 Encounters:  12/02/23 98%  06/04/23 96%  03/13/23 96%      Physical Exam Vitals and nursing note reviewed.  Constitutional:      General: She is not in acute distress.    Appearance: Normal appearance. She is well-developed.  HENT:     Head: Normocephalic and  atraumatic.  Eyes:     General: No scleral icterus.       Right eye: No discharge.        Left eye: No discharge.  Cardiovascular:     Rate and Rhythm: Normal rate and regular rhythm.     Heart sounds: No murmur heard. Pulmonary:     Effort: Pulmonary effort is normal. No respiratory distress.     Breath sounds: Normal breath sounds.  Musculoskeletal:        General: Normal range of motion.     Cervical back: Normal range of motion and neck supple.     Right lower leg: No edema.     Left lower leg: No edema.  Skin:    General: Skin is warm and dry.  Neurological:     Mental Status: She is alert and oriented to person, place, and time.  Psychiatric:        Mood and Affect: Mood normal.        Behavior: Behavior normal.        Thought Content: Thought content normal.        Judgment: Judgment normal.      No results found for any visits on 12/02/23.  Last CBC Lab Results  Component Value Date   WBC 6.9 07/29/2023   HGB 11.3 (L) 07/29/2023   HCT 34.4 (L) 07/29/2023   MCV 85.3 07/29/2023   MCH 28.5 08/09/2022   RDW 14.3 07/29/2023   PLT 253.0 07/29/2023   Last metabolic panel Lab Results  Component  Value Date   GLUCOSE 85 07/29/2023   NA 136 07/29/2023   K 4.2 07/29/2023   CL 101 07/29/2023   CO2 30 07/29/2023   BUN 13 07/29/2023   CREATININE 0.73 07/29/2023   GFR 84.75 07/29/2023   CALCIUM  9.7 07/29/2023   PROT 7.2 07/29/2023   ALBUMIN 3.9 07/29/2023   LABGLOB 3.1 07/22/2018   AGRATIO 1.4 07/22/2018   BILITOT 0.6 07/29/2023   ALKPHOS 87 07/29/2023   AST 18 07/29/2023   ALT 12 07/29/2023   ANIONGAP 11 08/31/2014   Last lipids Lab Results  Component Value Date   CHOL 153 07/29/2023   HDL 51.10 07/29/2023   LDLCALC 88 07/29/2023   LDLDIRECT 126.3 06/06/2011   TRIG 71.0 07/29/2023   CHOLHDL 3 07/29/2023   Last hemoglobin A1c Lab Results  Component Value Date   HGBA1C 6.2 07/29/2023   Last thyroid  functions Lab Results  Component Value Date    TSH 1.42 07/29/2023   T3TOTAL 52 (L) 07/22/2018   T4TOTAL 2.1 (L) 05/16/2020   Last vitamin D  Lab Results  Component Value Date   VD25OH 49 08/09/2022   Last vitamin B12 and Folate Lab Results  Component Value Date   VITAMINB12 400 03/13/2023   FOLATE >23.9 03/13/2023      The 10-year ASCVD risk score (Arnett DK, et al., 2019) is: 35.6%    Assessment & Plan:   Problem List Items Addressed This Visit       Unprioritized   Hypothyroidism - Primary   Relevant Orders   TSH   Fatigue   Relevant Orders   Vitamin B12   VITAMIN D  25 Hydroxy (Vit-D Deficiency, Fractures)   Type 2 diabetes mellitus with hyperglycemia, without long-term current use of insulin  (HCC)   Relevant Orders   Hemoglobin A1c   Microalbumin / creatinine urine ratio   Essential hypertension   Relevant Orders   CBC with Differential/Platelet   Comprehensive metabolic panel   Other Visit Diagnoses       OSA (obstructive sleep apnea)         Hyperlipidemia associated with type 2 diabetes mellitus (HCC)       Relevant Orders   Lipid panel   CBC with Differential/Platelet   Comprehensive metabolic panel     Anemia, unspecified type         Mixed incontinence urge and stress       Relevant Orders   Ambulatory referral to Urogynecology     Dry skin dermatitis       Relevant Medications   ammonium lactate  (LAC-HYDRIN ) 12 % lotion     Assessment and Plan    Fatigue Persistent fatigue over the past 10 days is accompanied by low energy, difficulty performing daily activities, and forgetfulness. Possible contributing factors include recent illness, stress, and potential underlying medical conditions. Identifying underlying causes is crucial to improving quality of life. Labs will be ordered to investigate these causes.  Type 2 Diabetes Mellitus Blood glucose readings range from 119 to 176 mg/dL, with management difficulties due to fatigue and lack of energy. Regular blood sugar monitoring, medication  adherence, dietary management, and physical activity are essential for better control. Regular monitoring and adherence to medication and diet will be emphasized.  Gastrointestinal Symptoms Intermittent nausea, vomiting, and abdominal pain suggest possible gastroenteritis, acid reflux, or constipation. Occasional burping, gas, and recent constipation are reported. Daily fiber intake and probiotics can help manage symptoms and improve bowel regularity. Daily use of Metamucil is recommended for constipation,  and a probiotic will be prescribed with advice on over-the-counter options.  Urinary Incontinence Ongoing urinary incontinence occurs mainly during the day, with some relief from Oxybutynin  at night. Previous physical therapy exercises were ineffective. A referral to a urogynecologist for specialized management is advised.  Dry Skin Severe dry skin, particularly on the legs, causes itching. Over-the-counter lotions have been tried with limited success. A prescription moisturizer will be prescribed to address this issue.  General Health Maintenance Uncertainty exists regarding the receipt of the pneumonia vaccine, though recent flu and COVID-19 vaccinations are confirmed. Verifying vaccination status is important. The pharmacy will be contacted to verify if the pneumonia vaccine has been administered, and it will be given if not.  Follow-up A message will be sent via MyChart to confirm pneumonia vaccination status. Follow-up with lab results will be conducted to investigate causes of fatigue, and an appointment with a urogynecologist will be scheduled.        No follow-ups on file.    Malissa Slay R Lowne Chase, DO

## 2023-12-03 ENCOUNTER — Encounter: Payer: Self-pay | Admitting: Nurse Practitioner

## 2023-12-03 LAB — CBC WITH DIFFERENTIAL/PLATELET
Basophils Absolute: 0.1 10*3/uL (ref 0.0–0.1)
Basophils Relative: 1 % (ref 0.0–3.0)
Eosinophils Absolute: 0.1 10*3/uL (ref 0.0–0.7)
Eosinophils Relative: 0.9 % (ref 0.0–5.0)
HCT: 33.8 % — ABNORMAL LOW (ref 36.0–46.0)
Hemoglobin: 11.5 g/dL — ABNORMAL LOW (ref 12.0–15.0)
Lymphocytes Relative: 37.2 % (ref 12.0–46.0)
Lymphs Abs: 2.4 10*3/uL (ref 0.7–4.0)
MCHC: 33.9 g/dL (ref 30.0–36.0)
MCV: 85.5 fL (ref 78.0–100.0)
Monocytes Absolute: 0.5 10*3/uL (ref 0.1–1.0)
Monocytes Relative: 8.1 % (ref 3.0–12.0)
Neutro Abs: 3.5 10*3/uL (ref 1.4–7.7)
Neutrophils Relative %: 52.8 % (ref 43.0–77.0)
Platelets: 278 10*3/uL (ref 150.0–400.0)
RBC: 3.95 Mil/uL (ref 3.87–5.11)
RDW: 13.7 % (ref 11.5–15.5)
WBC: 6.6 10*3/uL (ref 4.0–10.5)

## 2023-12-03 LAB — COMPREHENSIVE METABOLIC PANEL
ALT: 23 U/L (ref 0–35)
AST: 30 U/L (ref 0–37)
Albumin: 4.1 g/dL (ref 3.5–5.2)
Alkaline Phosphatase: 79 U/L (ref 39–117)
BUN: 16 mg/dL (ref 6–23)
CO2: 30 meq/L (ref 19–32)
Calcium: 9.6 mg/dL (ref 8.4–10.5)
Chloride: 102 meq/L (ref 96–112)
Creatinine, Ser: 0.74 mg/dL (ref 0.40–1.20)
GFR: 83.17 mL/min (ref >60.00)
Glucose, Bld: 78 mg/dL (ref 70–99)
Potassium: 4.7 meq/L (ref 3.5–5.1)
Sodium: 140 meq/L (ref 135–145)
Total Bilirubin: 0.7 mg/dL (ref 0.2–1.2)
Total Protein: 7.4 g/dL (ref 6.0–8.3)

## 2023-12-03 LAB — LIPID PANEL
Cholesterol: 150 mg/dL (ref 0–200)
HDL: 50.3 mg/dL (ref >39.00)
LDL Cholesterol: 79 mg/dL (ref 0–99)
NonHDL: 99.31
Total CHOL/HDL Ratio: 3
Triglycerides: 102 mg/dL (ref 0.0–149.0)
VLDL: 20.4 mg/dL (ref 0.0–40.0)

## 2023-12-03 LAB — VITAMIN D 25 HYDROXY (VIT D DEFICIENCY, FRACTURES): VITD: 51.4 ng/mL (ref 30.00–100.00)

## 2023-12-03 LAB — TSH: TSH: 1.14 u[IU]/mL (ref 0.35–5.50)

## 2023-12-03 LAB — MICROALBUMIN / CREATININE URINE RATIO
Creatinine,U: 105.8 mg/dL
Microalb Creat Ratio: 0.7 mg/g (ref 0.0–30.0)
Microalb, Ur: <0.7 mg/dL (ref 0.0–1.9)

## 2023-12-03 LAB — VITAMIN B12: Vitamin B-12: 319 pg/mL (ref 211–911)

## 2023-12-03 LAB — HEMOGLOBIN A1C: Hgb A1c MFr Bld: 6.4 % (ref 4.6–6.5)

## 2023-12-03 NOTE — Therapy (Signed)
 [facility] [address] [place], [place], [address] Phone: [phone]   Fax:  [phone]  Patient Details  Name: Debra [name] MRN: [id number] Date of Birth: 01/11/1956 Referring Provider:  No ref. provider found  Encounter Date: 12/03/2023  SPEECH THERAPY DISCHARGE SUMMARY  Visits from Start of Care: 7  Current functional level related to goals / functional outcomes: Unknown current level as pt did not return after last session.    Remaining deficits: Unknown   Education / Equipment: Cognitive strategies, word finding strategies    Patient agrees to discharge. Patient goals were partially met. Patient is being discharged due to not returning since the last visit.[name] [name] [name] [name], CCC-SLP 12/03/2023, 9:46 AM  [facility] [address] [place], [place], [address] Phone: [phone]   Fax:  [phone]

## 2023-12-04 ENCOUNTER — Other Ambulatory Visit: Payer: Self-pay | Admitting: Family Medicine

## 2023-12-05 ENCOUNTER — Ambulatory Visit (INDEPENDENT_AMBULATORY_CARE_PROVIDER_SITE_OTHER): Payer: 59 | Admitting: Physician Assistant

## 2023-12-05 VITALS — Resp 20 | Ht 63.0 in

## 2023-12-05 DIAGNOSIS — R413 Other amnesia: Secondary | ICD-10-CM | POA: Diagnosis not present

## 2023-12-05 NOTE — Progress Notes (Signed)
 Assessment/Plan:   Memory Impairment of multiple etiologies  Debra [name] is a very pleasant 68 y.o. RH female with a history off iron deficiency anemia, chronic back pain, GAD, MDD, hearing loss, hypertension, hyperlipidemia, RLS, GERD, DM2, vitamin D  deficiency, hypothyroidism presenting today in follow-up for evaluation of memory loss. Patient is on donepezil  5 mg , was not compliant, started  to take it again yesterday. MMSE today is 29/30 despite subjective complaints.      Recommendations:   Follow up in  6 months. Continue donepezil  5 mg daily, side effects discussed Resume CPAP for OSA Continue the management of iron deficiency anemia Highly recommend resuming counseling for MDD and GAD Continue PT for chronic pain Recommend checking with audiologist for decreased hearing Recommend good control of cardiovascular risk factors Continue to control mood as per PCP Tobacco cessation counseled    Subjective:   This patient is here alone. Previous records as well as any outside records available were reviewed prior to todays visit.   Patient was last seen on 02/15/2023 with [facility] 22/30.     Any changes in memory since last visit?  Not really good,going through a lot of stress.  He has some difficulty remembering recent conversations and names. repeats oneself?  Denies, but she has to ask people to repeat a question. Disoriented when walking into a room?  Patient denies    Misplacing objects?  Been losing some stuff  Wandering behavior?   Denies.   Any personality changes since last visit? Been feeling down, stressed.  Any worsening depression?:  She has a history of depression and anxiety, has not been following with counseling. Hallucinations or paranoia?  Denies.   Seizures?   denies    Any sleep changes? Does not sleep very well, she has a history of insomnia, takes Requip for restless legs. Sometimes she has vivid dreams, denies REM behavior or sleepwalking  Sleep  apnea?  Endorsed, she is noncompliant with the CPAP   Any hygiene concerns?   denies   Independent of bathing and dressing?  Endorsed  Does the patient needs help with medications? Patient is in charge, misses doses  Who is in charge of the finances?  Patient is in charge, has needs to be looked into the past     Any changes in appetite?  denies.  Admits not drinking enough water    Patient have trouble swallowing?  denies   Does the patient cook?  Yes, does forget, recipes.  Any kitchen accidents such as leaving the stove on?   denies   Any headaches?    denies   Vision changes? denies Chronic pain?  She has a history of chronic back pain, has been in physical therapy in the past. Ambulates with difficulty?  She reports being deconditioning has chronic difficulty with ambulation   Recent falls or head injuries?  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   denies   Any tremors?  denies   Any anosmia?    denies   Any incontinence of urine?  She has a history of some urge incontinence, wears pads Any bowel dysfunction?  Occasional constipation    Patient lives at home  Does the patient drive?  Endorsed, sometimes she has to use the GPS  Tobacco? More than before almost 1 ppd.    MRI of the brain, personally reviewed, without disproportionate lobar atrophy, but with moderate small vessel ischemic disease but without  acute findings.   Initial visit 03/13/2023 How long did patient have memory difficulties?  For several years, worse over the last year .Patient has some difficulty remembering recent conversations and people names. It is worse after Covid 2 years ago.  I was tested in the past for ADD, may have a slight but not a lot repeats oneself?  Denies repeating.  It' s me the one that is asking them to repeat .  Disoriented when walking into a room?  Patient denies except occasionally not remembering what patient came to the room for.     Leaving objects in unusual places?  I tried to get rid of stuff.  I have been trying to declog my house . Wandering behavior?  denies   Any personality changes since last visit?  Patient denies   Any history of depression?:  Endorsed, she has a history of depression and anxiety, followed a counselor but have not seen them in a while . Hallucinations or paranoia?  P I have seen something run across but turn my head and there is no one, not often .   Seizures?   I had epilepsy as a child   Any sleep changes?  She has insomnia . Does not sleep well, wakes up during the night, yesterday she slept 3 hrs. Takes Requip that helps.  Sometimes she has vivid dreams, denies  REM behavior or sleepwalking   Sleep apnea? Endorsed, years ago I got one, but I stopped using it for years the CPAP is sitting out there.    Any hygiene concerns?  Patient denies   Independent of bathing and dressing?  Endorsed  Does the patient needs help with medications?  Patient is in charge, misses doses   Who is in charge of the finances?  Patient is in charge, has missed bills in the past      Any changes in appetite?  I am on something for my DM, Ozempic , to lose weight . Not drinking enough water  Patient have trouble swallowing? denies   Does the patient cook?  Yes. Forgets common recipes  Any kitchen accidents such as leaving the stove on? denies   Any headaches?   denies   Chronic back pain ? Endorsed, takes pain meds did physical therapy for a while , It was rough Ambulates with difficulty?  Chronic, Sometimes I lose the strength in my legs.  Recent falls or head injuries? Endorsed, sometimes my legs are weaker. I am to attend PT for strength Vision changes?  She had recent cataract surgery it did not help much  Unilateral weakness, numbness or tingling? denies   Any tremors?   denies   Any anosmia?  denies   Any incontinence of urine?  She has a history of urge incontinence. Wears pads  Any bowel  dysfunction?  Some constipation    Patient lives alone  History of heavy alcohol intake?  Denies  history of heavy tobacco use?  Trying to quit, uses nicotine  patch. Was doing 1/2 ppd but stopped 4 weeks ago  Family history of dementia? Aunt has dementia unknown type Does patient drive? Endorsed, sometimes I have to use my GPS  Past Medical History:  Diagnosis Date   Abdominal pain, right upper quadrant 04/30/2007   Allergies 02/03/2019   Allergy    Anemia, iron deficiency 04/17/2017   Aortic atherosclerosis 10/10/2017   Atypical chest pain 08/31/2014   Back pain with radiculopathy 01/25/2010   Check xray --- ? Cause of leg  weakness   Chronic pain of both shoulders 10/31/2020   Constipation    DDD (degenerative disc disease)    Diabetes mellitus type II, uncontrolled 01/11/2020   Diastolic dysfunction 02/15/2010   Epilepsy    Childhood seziures; pt reports that she grew out of them and they have not occurred since childhood   Essential hypertension 08/31/2010   Poorly controlled will alter medications, encouraged DASH diet, minimize caffeine and obtain adequate sleep. Report concerning symptoms and follow up as directed and as needed [employer] metoprolol  Edema may be c   Fatigue 01/25/2010   Generalized anxiety disorder 04/22/2018   Stable co'nt meds   GERD (gastroesophageal reflux disease) 07/16/2007   Hearing loss 07/24/2012   Refer to hearing clinic   Hiatal hernia 07/16/2007   Hyperglycemia, fasting 05/04/2010   Hyperlipidemia 04/17/2017   Encouraged heart healthy diet, increase exercise, avoid trans fats, consider a krill oil cap daily   Hypothyroidism 11/02/2010   con't meds; Lab Results Component Value TSH 1.44 08/12/2017   Insomnia 04/30/2007   Knee pain 07/16/2007   Leg pain, bilateral 11/17/2008   Low back pain 07/16/2007   Major depressive disorder 01/30/2018   con't meds F/u counselor   Mixed connective tissue disease    with Raynaud's   Obstructive  sleep apnea 01/30/2018   F/u with pulm; May be cause of some of her memory/concentration problems   Osteoarthritis    Osteoporosis    Palpitations 02/12/2008   Pansinusitis 01/30/2018   Peripheral vertigo 04/30/2007   Resolved rto prn   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     PREVIOUS MEDICATIONS:   CURRENT MEDICATIONS:  Outpatient Encounter Medications as of 12/05/2023  Medication Sig   Accu-Chek Softclix Lancets lancets USE TO CHECK BLOOD GLUCOSE ONCE A DAY   ammonium lactate  (LAC-HYDRIN ) 12 % lotion Apply 1 Application topically as needed for dry skin.   azelastine  (ASTELIN ) 0.1 % nasal spray Place 2 sprays into both nostrils 2 (two) times daily. Use in each nostril as directed   Blood Glucose Monitoring Suppl (ACCU-CHEK GUIDE) w/Device KIT Use to check blood glucose daily (DX: type 2 DM E 11.65)   donepezil  (ARICEPT ) 5 MG tablet Take 1 tablet (5 mg total) by mouth daily.   doxycycline  (VIBRA -TABS) 100 MG tablet Take 1 tablet (100 mg total) by mouth 2 (two) times daily.   fluticasone  (FLONASE ) 50 MCG/ACT nasal spray Place 2 sprays into both nostrils daily.   gabapentin  (NEURONTIN ) 600 MG tablet TAKE 1 TABLET BY MOUTH 3 TIMES  DAILY   glucose blood (ACCU-CHEK GUIDE) test strip USE TO CHECK BLOOD GLUCOSE ONCE A DAY. MAY SUBSTITUTE GLUCOMETER THAT IS PREFERRED BY PATIENTS INSURANCE   hydrochlorothiazide  (HYDRODIURIL ) 25 MG tablet Take 1 tablet (25 mg total) by mouth daily.   levothyroxine  (SYNTHROID ) 50 MCG tablet Take 1 tablet (50 mcg total) by mouth daily.   losartan   (COZAAR ) 100 MG tablet Take 1 tablet (100 mg total) by  mouth daily.   metoprolol  succinate (TOPROL -XL) 100 MG 24 hr tablet Take 1 tablet (100 mg total) by mouth daily. TAKE WITH OR IMMEDIATELY FOLLOWING A MEAL.   montelukast  (SINGULAIR ) 10 MG tablet Take 1 tablet (10 mg total) by mouth at bedtime.   nicotine  (NICODERM CQ  - DOSED IN MG/24 HOURS) 14 mg/24hr patch Place 1 patch (14 mg total) onto the skin daily. Remove and apply new patch each morning.   NON FORMULARY [facility]  Scar Cream -  Verapamil 10%, Pentoxifylline 5% Apply 1-2 grams to affected area 3-4 times daily Qty. 120 gm 3 refills   oxybutynin  (DITROPAN -XL) 10 MG 24 hr tablet Take 1 tablet (10 mg total) by mouth at bedtime.   PARoxetine  (PAXIL ) 40 MG tablet Take 1 tablet (40 mg total) by mouth every morning.   pravastatin  (PRAVACHOL ) 40 MG tablet Take 1 tablet (40 mg total) by mouth daily.   saccharomyces boulardii (FLORASTOR) 250 MG capsule Take 1 capsule (250 mg total) by mouth 2 (two) times daily.   Semaglutide , 1 MG/DOSE, (OZEMPIC , 1 MG/DOSE,) 4 MG/3ML SOPN Inject 1 mg into the skin once a week.   [DISCONTINUED] pravastatin  (PRAVACHOL ) 40 MG tablet Take 1 tablet (40 mg total) by mouth daily.   No facility-administered encounter medications on file as of 12/05/2023.     Objective:     PHYSICAL EXAMINATION:    VITALS:   Vitals:   12/05/23 1445  Resp: 20  Height: 5' 3 (1.6 m)    GEN:  The patient appears stated age and is in NAD. HEENT:  Normocephalic, atraumatic.   Neurological examination:  General: NAD, well-groomed, appears stated age. Orientation: The patient is alert. Oriented to person, place and date Cranial nerves: There is good facial symmetry. Very anxious appearing. The speech is fluent and clear. No aphasia or dysarthria. Fund of knowledge is appropriate. Recent memory impaired and remote memory is normal.  Attention and concentration are normal.  Able to name objects and repeat phrases.   Hearing is intact to conversational tone  Sensation: Sensation is intact to light touch throughout Motor: Strength is at least antigravity x4. DTR's 2/4 in UE/LE      03/13/2023   12:00 PM  Montreal Cognitive Assessment   Visuospatial/ Executive (0/5) 4  Naming (0/3) 3  Attention: Read list of digits (0/2) 2  Attention: Read list of letters (0/1) 1  Attention: Serial 7 subtraction starting at 100 (0/3) 1  Language: Repeat phrase (0/2) 1  Language : Fluency (0/1) 0  Abstraction (0/2) 1  Delayed Recall (0/5) 3  Orientation (0/6) 6  Total 22  Adjusted Score (based on education) 22       12/05/2023    2:00 PM  MMSE - Mini Mental State Exam  Orientation to time 5  Orientation to Place 5  Registration 3  Attention/ Calculation 5  Recall 2  Language- name 2 objects 2  Language- repeat 1  Language- follow 3 step command 3  Language- read & follow direction 1  Write a sentence 1  Copy design 1  Total score 29       Movement examination: Tone: There is normal tone in the UE/LE Abnormal movements:  no tremor.  No myoclonus.  No asterixis.   Coordination:  There is no decremation with RAM's. Normal finger to nose  Gait and Station: The patient has no difficulty arising out of a deep-seated chair without the use of the hands. The patient's stride length is good.  Gait is cautious and narrow.   Thank you for allowing us  the opportunity to participate in the care of this nice patient. Please do not hesitate to contact us  for any questions or concerns.   Total time spent on today's visit was 21 minutes dedicated to this patient today, preparing to see patient, examining the patient, ordering tests and/or medications and counseling the patient, documenting clinical information in the EHR or other health record, independently interpreting results and communicating results to the patient/family, discussing treatment and goals, answering patient's questions and coordinating care.  Cc:   Antonio Cyndee Jamee JONELLE, DO  [facility] 12/05/2023 3:02 PM

## 2023-12-05 NOTE — Patient Instructions (Signed)
It was a pleasure to see you today at our office.    Neuropsych evaluation Recommend psychotherapy for anxiety and depression Discontinue smoking  Resume CPAP for sleep apnea Continue the management of iron deficiency anemia Control the BP, cholesterol and sugar and thyroid We will start donepezil  5 mg daily  Follow up in 6 months    Recommendations:  You have been referred for a neuropsychological evaluation (i.e., evaluation of memory and thinking abilities). Please bring someone with you to this appointment if possible, as it is helpful for the doctor to hear from both you and another adult who knows you well. Please bring eyeglasses and hearing aids if you wear them.    The evaluation will take approximately 3 hours and has two parts:   The first part is a clinical interview with the neuropsychologist (Dr. Milbert Coulter or Dr. Roseanne Reno). During the interview, the neuropsychologist will speak with you and the individual you brought to the appointment.    The second part of the evaluation is testing with the doctor's technician Annabelle Harman or Selena Batten). During the testing, the technician will ask you to remember different types of material, solve problems, and answer some questionnaires. Your family member will not be present for this portion of the evaluation.   Please note: We must reserve several hours of the neuropsychologist's time and the psychometrician's time for your evaluation appointment. As such, there is a No-Show fee of $100. If you are unable to attend any of your appointments, please contact our office as soon as possible to reschedule.  Check labs today   Follow up in 1 month.    For assessment of decision of mental capacity and competency:  Call Dr. Erick Blinks, geriatric psychiatrist at [phone] Counseling regarding caregiver distress, including caregiver depression, anxiety and issues regarding community resources, adult day care programs, adult living facilities, or memory care  questions:  please contact your  Primary Doctor's Social Worker  Whom to call: Memory  decline, memory medications: Call our office [phone]  For psychiatric meds, mood meds: Please have your primary care physician manage these medications.  If you have any severe symptoms of a stroke, or other severe issues such as confusion,severe chills or fever, etc call 911 or go to the ER as you may need to be evaluated further    RECOMMENDATIONS FOR ALL PATIENTS WITH MEMORY PROBLEMS: 1. Continue to exercise (Recommend 30 minutes of walking everyday, or 3 hours every week) 2. Increase social interactions - continue going to [place] and enjoy social gatherings with friends and family 3. Eat healthy, avoid fried foods and eat more fruits and vegetables 4. Maintain adequate blood pressure, blood sugar, and blood cholesterol level. Reducing the risk of stroke and cardiovascular disease also helps promoting better memory. 5. Avoid stressful situations. Live a simple life and avoid aggravations. Organize your time and prepare for the next day in anticipation. 6. Sleep well, avoid any interruptions of sleep and avoid any distractions in the bedroom that may interfere with adequate sleep quality 7. Avoid sugar, avoid sweets as there is a strong link between excessive sugar intake, diabetes, and cognitive impairment We discussed the Mediterranean diet, which has been shown to help patients reduce the risk of progressive memory disorders and reduces cardiovascular risk. This includes eating fish, eat fruits and green leafy vegetables, nuts like almonds and hazelnuts, walnuts, and also use olive oil. Avoid fast foods and fried foods as much as possible. Avoid sweets and sugar as sugar use  has been linked to worsening of memory function.  There is always a concern of gradual progression of memory problems. If this is the case, then we may need to adjust level of care according to patient needs. Support, both to the  patient and caregiver, should then be put into place.       FALL PRECAUTIONS: Be cautious when walking. Scan the area for obstacles that may increase the risk of trips and falls. When getting up in the mornings, sit up at the edge of the bed for a few minutes before getting out of bed. Consider elevating the bed at the head end to avoid drop of blood pressure when getting up. Walk always in a well-lit room (use night lights in the walls). Avoid area rugs or power cords from appliances in the middle of the walkways. Use a walker or a cane if necessary and consider physical therapy for balance exercise. Get your eyesight checked regularly.  FINANCIAL OVERSIGHT: Supervision, especially oversight when making financial decisions or transactions is also recommended.  HOME SAFETY: Consider the safety of the kitchen when operating appliances like stoves, microwave oven, and blender. Consider having supervision and share cooking responsibilities until no longer able to participate in those. Accidents with firearms and other hazards in the house should be identified and addressed as well.   ABILITY TO BE LEFT ALONE: If patient is unable to contact 911 operator, consider using LifeLine, or when the need is there, arrange for someone to stay with patients. Smoking is a fire hazard, consider supervision or cessation. Risk of wandering should be assessed by caregiver and if detected at any point, supervision and safe proof recommendations should be instituted.  MEDICATION SUPERVISION: Inability to self-administer medication needs to be constantly addressed. Implement a mechanism to ensure safe administration of the medications.   DRIVING: Regarding driving, in patients with progressive memory problems, driving will be impaired. We advise to have someone else do the driving if trouble finding directions or if minor accidents are reported. Independent driving assessment is available to determine safety of  driving.   If you are interested in the driving assessment, you can contact the following:  [facility] [employer] in [place] [phone]  [facility] [phone]  [facility] [phone] or [phone]    Mediterranean Diet A Mediterranean diet refers to food and lifestyle choices that are based on the traditions of countries located on the [employer]. This way of eating has been shown to help prevent certain conditions and improve outcomes for people who have chronic diseases, like kidney disease and heart disease. What are tips for following this plan? Lifestyle  Cook and eat meals together with your family, when possible. Drink enough fluid to keep your urine clear or pale yellow. Be physically active every day. This includes: Aerobic exercise like running or swimming. Leisure activities like gardening, walking, or housework. Get 7-8 hours of sleep each night. If recommended by your health care provider, drink red wine in moderation. This means 1 glass a day for nonpregnant women and 2 glasses a day for men. A glass of wine equals 5 oz (150 mL). Reading food labels  Check the serving size of packaged foods. For foods such as rice and pasta, the serving size refers to the amount of cooked product, not dry. Check the total fat in packaged foods. Avoid foods that have saturated fat or trans fats. Check the ingredients list for added sugars, such as corn  syrup. Shopping  At the grocery store, buy most of your food from the areas near the walls of the store. This includes: Fresh fruits and vegetables (produce). Grains, beans, nuts, and seeds. Some of these may be available in unpackaged forms or large amounts (in bulk). Fresh seafood. Poultry and eggs. Low-fat dairy products. Buy whole ingredients instead of prepackaged foods. Buy fresh fruits and vegetables in-season from local farmers markets. Buy  frozen fruits and vegetables in resealable bags. If you do not have access to quality fresh seafood, buy precooked frozen shrimp or canned fish, such as tuna, salmon, or sardines. Buy small amounts of raw or cooked vegetables, salads, or olives from the deli or salad bar at your store. Stock your pantry so you always have certain foods on hand, such as olive oil, canned tuna, canned tomatoes, rice, pasta, and beans. Cooking  Cook foods with extra-virgin olive oil instead of using butter or other vegetable oils. Have meat as a side dish, and have vegetables or grains as your main dish. This means having meat in small portions or adding small amounts of meat to foods like pasta or stew. Use beans or vegetables instead of meat in common dishes like chili or lasagna. Experiment with different cooking methods. Try roasting or broiling vegetables instead of steaming or sauteing them. Add frozen vegetables to soups, stews, pasta, or rice. Add nuts or seeds for added healthy fat at each meal. You can add these to yogurt, salads, or vegetable dishes. Marinate fish or vegetables using olive oil, lemon juice, garlic, and fresh herbs. Meal planning  Plan to eat 1 vegetarian meal one day each week. Try to work up to 2 vegetarian meals, if possible. Eat seafood 2 or more times a week. Have healthy snacks readily available, such as: Vegetable sticks with hummus. Greek yogurt. Fruit and nut trail mix. Eat balanced meals throughout the week. This includes: Fruit: 2-3 servings a day Vegetables: 4-5 servings a day Low-fat dairy: 2 servings a day Fish, poultry, or lean meat: 1 serving a day Beans and legumes: 2 or more servings a week Nuts and seeds: 1-2 servings a day Whole grains: 6-8 servings a day Extra-virgin olive oil: 3-4 servings a day Limit red meat and sweets to only a few servings a month What are my food choices? Mediterranean diet Recommended Grains: Whole-grain pasta. Brown rice. Bulgar  wheat. Polenta. Couscous. Whole-wheat bread. Orpah Cobb. Vegetables: Artichokes. Beets. Broccoli. Cabbage. Carrots. Eggplant. Green beans. Chard. Kale. Spinach. Onions. Leeks. Peas. Squash. Tomatoes. Peppers. Radishes. Fruits: Apples. Apricots. Avocado. Berries. Bananas. Cherries. Dates. Figs. Grapes. Lemons. Melon. Oranges. Peaches. Plums. Pomegranate. Meats and other protein foods: Beans. Almonds. Sunflower seeds. Pine nuts. Peanuts. Cod. Salmon. Scallops. Shrimp. Tuna. Tilapia. Clams. Oysters. Eggs. Dairy: Low-fat milk. Cheese. Greek yogurt. Beverages: Water. Red wine. Herbal tea. Fats and oils: Extra virgin olive oil. Avocado oil. Grape seed oil. Sweets and desserts: [place] yogurt with honey. Baked apples. Poached pears. Trail mix. Seasoning and other foods: Basil. Cilantro. Coriander. Cumin. Mint. Parsley. Sage. Rosemary. Tarragon. Garlic. Oregano. Thyme. Pepper. Balsalmic vinegar. Tahini. Hummus. Tomato sauce. Olives. Mushrooms. Limit these Grains: Prepackaged pasta or rice dishes. Prepackaged cereal with added sugar. Vegetables: Deep fried potatoes (french fries). Fruits: Fruit canned in syrup. Meats and other protein foods: Beef. Pork. Lamb. Poultry with skin. Hot dogs. Tomasa Blase. Dairy: Ice cream. Sour cream. Whole milk. Beverages: Juice. Sugar-sweetened soft drinks. Beer. Liquor and spirits. Fats and oils: Butter. Canola oil. Vegetable oil. Beef fat (tallow). Lard. Sweets  and desserts: Cookies. Cakes. Pies. Candy. Seasoning and other foods: Mayonnaise. Premade sauces and marinades. The items listed may not be a complete list. Talk with your dietitian about what dietary choices are right for you. Summary The Mediterranean diet includes both food and lifestyle choices. Eat a variety of fresh fruits and vegetables, beans, nuts, seeds, and whole grains. Limit the amount of red meat and sweets that you eat. Talk with your health care provider about whether it is safe for you to drink red  wine in moderation. This means 1 glass a day for nonpregnant women and 2 glasses a day for men. A glass of wine equals 5 oz (150 mL). This information is not intended to replace advice given to you by your health care provider. Make sure you discuss any questions you have with your health care provider. Document Released: 06/06/2016 Document Revised: 07/09/2016 Document Reviewed: 06/06/2016 Elsevier Interactive Patient Education  2017 [employer].

## 2023-12-07 ENCOUNTER — Other Ambulatory Visit: Payer: Self-pay | Admitting: Family Medicine

## 2023-12-07 DIAGNOSIS — I10 Essential (primary) hypertension: Secondary | ICD-10-CM

## 2023-12-18 ENCOUNTER — Ambulatory Visit: Payer: 59 | Admitting: Dietician

## 2023-12-22 ENCOUNTER — Other Ambulatory Visit: Payer: Self-pay | Admitting: Family Medicine

## 2023-12-22 DIAGNOSIS — I10 Essential (primary) hypertension: Secondary | ICD-10-CM

## 2023-12-24 ENCOUNTER — Encounter: Payer: 59 | Attending: Family Medicine | Admitting: Dietician

## 2023-12-24 DIAGNOSIS — E1165 Type 2 diabetes mellitus with hyperglycemia: Secondary | ICD-10-CM | POA: Insufficient documentation

## 2023-12-24 NOTE — Patient Instructions (Addendum)
 Increase physical activity aiming for 3 days weekly using at home videos as tolerated

## 2023-12-24 NOTE — Progress Notes (Signed)
 Diabetes Self-Management Education  Visit Type:  Follow-up  Appt. Start Time: 1122 Appt. End Time: 1218  12/24/2023  [name], identified by name and date of birth, is a 68 y.o. female with a diagnosis of Diabetes:  .     ASSESSMENT  Patient is here today alone for follow up. Pt reports she recently traveled to visit family stating gained weight, missed a dose of weekly GLP. Pt reports ongoing confusion. Pt reports she lost all her nutrition and diabetes handouts from previous encounter and RD provided today. Pt reports self monitoring blood sugar yesterday 2 hour ppg of "141" mg/dL. Pt reports constipation and states making changes to improve bowel movements. Pt desires to increase physical activity and states weather as a barrier-RD reviewed video exercises. Pt reports increasing water intake remains challenging. All Pt's questions were answered during today's encounter.    Lab Results  Component Value Date   HGBA1C 6.4 12/02/2023   Lab Results  Component Value Date   CHOL 150 12/02/2023   HDL 50.30 12/02/2023   LDLCALC 79 12/02/2023   LDLDIRECT 126.3 06/06/2011   TRIG 102.0 12/02/2023   CHOLHDL 3 12/02/2023   There were no vitals taken for this visit. There is no height or weight on file to calculate BMI.    Diabetes Self-Management Education - 12/24/23 1130       Health Coping   How would you rate your overall health? Good      Psychosocial Assessment   Patient Belief/Attitude about Diabetes Motivated to manage diabetes    What is the hardest part about your diabetes right now, causing you the most concern, or is the most worrisome to you about your diabetes?   Making healty food and beverage choices    Self-care barriers None    Self-management support Doctor's office    Patient Concerns Nutrition/Meal planning    Special Needs Other (comment)   memory concerns   Preferred Learning Style No preference indicated    Learning Readiness Ready       Pre-Education Assessment   Patient understands the diabetes disease and treatment process. Comprehends key points    Patient understands incorporating nutritional management into lifestyle. Needs Review    Patient undertands incorporating physical activity into lifestyle. Needs Review    Patient understands using medications safely. Comprehends key points    Patient understands monitoring blood glucose, interpreting and using results Needs Review    Patient understands prevention, detection, and treatment of acute complications. Needs Review    Patient understands prevention, detection, and treatment of chronic complications. Needs Review    Patient understands how to develop strategies to address psychosocial issues. Needs Review    Patient understands how to develop strategies to promote health/change behavior. Needs Review      Complications   Last HgB A1C per patient/outside source 6.4 %    How often do you check your blood sugar? 3-4 times / week    Postprandial Blood glucose range (mg/dL) 528-413    Number of hyperglycemic episodes ( >200mg /dL): Never    Have you had a dilated eye exam in the past 12 months? No    Have you had a dental exam in the past 12 months? --   unknown   Are you checking your feet? Yes    How many days per week are you checking your feet? 7      Dietary Intake   Breakfast 1.5 cups raisin bran, fairlife milk  Lunch skips    Dinner fish sandwich, cracker with peanut butter      Activity / Exercise   Activity / Exercise Type ADL's      Patient Education   Previous Diabetes Education Yes (please comment)    Disease Pathophysiology Explored patient's options for treatment of their diabetes    Healthy Eating Plate Method    Being Active Role of exercise on diabetes management, blood pressure control and cardiac health.    Medications Reviewed patients medication for diabetes, action, purpose, timing of dose and side effects.    Monitoring Daily foot  exams;Yearly dilated eye exam;Identified appropriate SMBG and/or A1C goals.;Purpose and frequency of SMBG.    Chronic complications Dental care    Diabetes Stress and Support Identified and addressed patients feelings and concerns about diabetes    Preconception care --   n/a   Lifestyle and Health Coping Lifestyle issues that need to be addressed for better diabetes care      Individualized Goals (developed by patient)   Nutrition Follow meal plan discussed    Medications take my medication as prescribed    Monitoring  Test my blood glucose as discussed    Problem Solving Eating Pattern    Reducing Risk do foot checks daily;examine blood glucose patterns    Health Coping Ask for help with psychological, social, or emotional issues      Patient Self-Evaluation of Goals - Patient rates self as meeting previously set goals (% of time)   Nutrition 50 - 75 % (half of the time)    Physical Activity < 25% (hardly ever/never)    Medications 50 - 75 % (half of the time)    Monitoring 50 - 75 % (half of the time)    Problem Solving and behavior change strategies  < 25% (hardly ever/never)    Reducing Risk (treating acute and chronic complications) < 25% (hardly ever/never)    Health Coping --   unknown     Post-Education Assessment   Patient understands the diabetes disease and treatment process. Needs Review    Patient understands incorporating nutritional management into lifestyle. Needs Review    Patient undertands incorporating physical activity into lifestyle. Needs Review    Patient understands using medications safely. Needs Review    Patient understands monitoring blood glucose, interpreting and using results Needs Review    Patient understands prevention, detection, and treatment of acute complications. Needs Review    Patient understands prevention, detection, and treatment of chronic complications. Needs Review    Patient understands how to develop strategies to address psychosocial  issues. Needs Review    Patient understands how to develop strategies to promote health/change behavior. Needs Review      Outcomes   Program Status Not Completed      Subsequent Visit   Since your last visit have you continued or begun to take your medications as prescribed? Yes    Since your last visit have you had your blood pressure checked? --   unknown   Since your last visit have you experienced any weight changes? Gain    Since your last visit, are you checking your blood glucose at least once a day? No             Learning Objective:  Patient will have a greater understanding of diabetes self-management. Patient education plan is to attend individual and/or group sessions per assessed needs and concerns.   Plan:   Patient Instructions  Increase physical activity aiming  for 3 days weekly using at home videos as tolerated   Expected Outcomes:  Demonstrated limited interest in learning.  Expect minimal changes  Education material provided: ADA - How to Thrive: A Guide for Your Journey with Diabetes and My Plate  If problems or questions, patient to contact team via:  Phone  Future DSME appointment: - 3-4 months

## 2024-01-01 ENCOUNTER — Telehealth: Payer: Self-pay

## 2024-01-01 NOTE — Telephone Encounter (Signed)
 Unsuccessful attempts to reach patient on preferred number listed in notes for scheduled AWV. Left message on voicemail okay to reschedule.

## 2024-01-06 ENCOUNTER — Ambulatory Visit (INDEPENDENT_AMBULATORY_CARE_PROVIDER_SITE_OTHER): Admitting: *Deleted

## 2024-01-06 VITALS — BP 120/71 | HR 78 | Ht 63.0 in | Wt 158.0 lb

## 2024-01-06 DIAGNOSIS — Z0000 Encounter for general adult medical examination without abnormal findings: Secondary | ICD-10-CM | POA: Diagnosis not present

## 2024-01-06 DIAGNOSIS — Z1231 Encounter for screening mammogram for malignant neoplasm of breast: Secondary | ICD-10-CM

## 2024-01-06 DIAGNOSIS — Z780 Asymptomatic menopausal state: Secondary | ICD-10-CM

## 2024-01-06 DIAGNOSIS — Z720 Tobacco use: Secondary | ICD-10-CM

## 2024-01-06 MED ORDER — BLOOD GLUCOSE TEST VI STRP: 1.0000 | ORAL_STRIP | Freq: Three times a day (TID) | 0 refills | Status: AC

## 2024-01-06 MED ORDER — LANCETS MISC. MISC
1.0000 | Freq: Three times a day (TID) | 0 refills | Status: AC
Start: 2024-01-06 — End: 2024-02-05

## 2024-01-06 MED ORDER — BLOOD GLUCOSE MONITORING SUPPL DEVI: 1.0000 | Freq: Three times a day (TID) | 0 refills | Status: AC

## 2024-01-06 MED ORDER — LANCET DEVICE MISC: 1.0000 | Freq: Three times a day (TID) | 0 refills | Status: AC

## 2024-01-06 NOTE — Patient Instructions (Signed)
 Debra [name] , Thank you for taking time to come for your Medicare Wellness Visit. I appreciate your ongoing commitment to your health goals. Please review the following plan we discussed and let me know if I can assist you in the future.     This is a list of the screening recommended for you and due dates:  Health Maintenance  Topic Date Due   Screening for Lung Cancer  08/27/2019   Pneumonia Vaccine (2 of 2 - PCV) 02/27/2022   Mammogram  06/20/2023   DEXA scan (bone density measurement)  06/20/2023   COVID-19 Vaccine (6 - 2024-25 season) 06/29/2023   Flu Shot  01/26/2024*   Complete foot exam   02/11/2024   Eye exam for diabetics  04/08/2024   Hemoglobin A1C  05/31/2024   Yearly kidney function blood test for diabetes  12/01/2024   Yearly kidney health urinalysis for diabetes  12/01/2024   Medicare Annual Wellness Visit  01/05/2025   Colon Cancer Screening  07/27/2031   DTaP/Tdap/Td vaccine (4 - Td or Tdap) 09/28/2031   Hepatitis C Screening  Completed   Zoster (Shingles) Vaccine  Completed   HPV Vaccine  Aged Out  *Topic was postponed. The date shown is not the original due date.    Next appointment: Follow up in one year for your annual wellness visit.   Preventive Care 69 Years and Older, Female Preventive care refers to lifestyle choices and visits with your health care provider that can promote health and wellness. What does preventive care include? A yearly physical exam. This is also called an annual well check. Dental exams once or twice a year. Routine eye exams. Ask your health care provider how often you should have your eyes checked. Personal lifestyle choices, including: Daily care of your teeth and gums. Regular physical activity. Eating a healthy diet. Avoiding tobacco and drug use. Limiting alcohol use. Practicing safe sex. Taking low-dose aspirin every day. Taking vitamin and mineral supplements as recommended by your health care provider. What happens  during an annual well check? The services and screenings done by your health care provider during your annual well check will depend on your age, overall health, lifestyle risk factors, and family history of disease. Counseling  Your health care provider may ask you questions about your: Alcohol use. Tobacco use. Drug use. Emotional well-being. Home and relationship well-being. Sexual activity. Eating habits. History of falls. Memory and ability to understand (cognition). Work and work Astronomer. Reproductive health. Screening  You may have the following tests or measurements: Height, weight, and BMI. Blood pressure. Lipid and cholesterol levels. These may be checked every 5 years, or more frequently if you are over 30 years old. Skin check. Lung cancer screening. You may have this screening every year starting at age 45 if you have a 30-pack-year history of smoking and currently smoke or have quit within the past 15 years. Fecal occult blood test (FOBT) of the stool. You may have this test every year starting at age 49. Flexible sigmoidoscopy or colonoscopy. You may have a sigmoidoscopy every 5 years or a colonoscopy every 10 years starting at age 51. Hepatitis C blood test. Hepatitis B blood test. Sexually transmitted disease (STD) testing. Diabetes screening. This is done by checking your blood sugar (glucose) after you have not eaten for a while (fasting). You may have this done every 1-3 years. Bone density scan. This is done to screen for osteoporosis. You may have this done starting at age 69. Mammogram.  This may be done every 1-2 years. Talk to your health care provider about how often you should have regular mammograms. Talk with your health care provider about your test results, treatment options, and if necessary, the need for more tests. Vaccines  Your health care provider may recommend certain vaccines, such as: Influenza vaccine. This is recommended every  year. Tetanus, diphtheria, and acellular pertussis (Tdap, Td) vaccine. You may need a Td booster every 10 years. Zoster vaccine. You may need this after age 61. Pneumococcal 13-valent conjugate (PCV13) vaccine. One dose is recommended after age 3. Pneumococcal polysaccharide (PPSV23) vaccine. One dose is recommended after age 69. Talk to your health care provider about which screenings and vaccines you need and how often you need them. This information is not intended to replace advice given to you by your health care provider. Make sure you discuss any questions you have with your health care provider. Document Released: 11/10/2015 Document Revised: 07/03/2016 Document Reviewed: 08/15/2015 Elsevier Interactive Patient Education  2017 [employer].  Fall Prevention in the Home Falls can cause injuries. They can happen to people of all ages. There are many things you can do to make your home safe and to help prevent falls. What can I do on the outside of my home? Regularly fix the edges of walkways and driveways and fix any cracks. Remove anything that might make you trip as you walk through a door, such as a raised step or threshold. Trim any bushes or trees on the path to your home. Use bright outdoor lighting. Clear any walking paths of anything that might make someone trip, such as rocks or tools. Regularly check to see if handrails are loose or broken. Make sure that both sides of any steps have handrails. Any raised decks and porches should have guardrails on the edges. Have any leaves, snow, or ice cleared regularly. Use sand or salt on walking paths during winter. Clean up any spills in your garage right away. This includes oil or grease spills. What can I do in the bathroom? Use night lights. Install grab bars by the toilet and in the tub and shower. Do not use towel bars as grab bars. Use non-skid mats or decals in the tub or shower. If you need to sit down in the shower, use a  plastic, non-slip stool. Keep the floor dry. Clean up any water that spills on the floor as soon as it happens. Remove soap buildup in the tub or shower regularly. Attach bath mats securely with double-sided non-slip rug tape. Do not have throw rugs and other things on the floor that can make you trip. What can I do in the bedroom? Use night lights. Make sure that you have a light by your bed that is easy to reach. Do not use any sheets or blankets that are too big for your bed. They should not hang down onto the floor. Have a firm chair that has side arms. You can use this for support while you get dressed. Do not have throw rugs and other things on the floor that can make you trip. What can I do in the kitchen? Clean up any spills right away. Avoid walking on wet floors. Keep items that you use a lot in easy-to-reach places. If you need to reach something above you, use a strong step stool that has a grab bar. Keep electrical cords out of the way. Do not use floor polish or wax that makes floors slippery. If you  must use wax, use non-skid floor wax. Do not have throw rugs and other things on the floor that can make you trip. What can I do with my stairs? Do not leave any items on the stairs. Make sure that there are handrails on both sides of the stairs and use them. Fix handrails that are broken or loose. Make sure that handrails are as long as the stairways. Check any carpeting to make sure that it is firmly attached to the stairs. Fix any carpet that is loose or worn. Avoid having throw rugs at the top or bottom of the stairs. If you do have throw rugs, attach them to the floor with carpet tape. Make sure that you have a light switch at the top of the stairs and the bottom of the stairs. If you do not have them, ask someone to add them for you. What else can I do to help prevent falls? Wear shoes that: Do not have high heels. Have rubber bottoms. Are comfortable and fit you  well. Are closed at the toe. Do not wear sandals. If you use a stepladder: Make sure that it is fully opened. Do not climb a closed stepladder. Make sure that both sides of the stepladder are locked into place. Ask someone to hold it for you, if possible. Clearly mark and make sure that you can see: Any grab bars or handrails. First and last steps. Where the edge of each step is. Use tools that help you move around (mobility aids) if they are needed. These include: Canes. Walkers. Scooters. Crutches. Turn on the lights when you go into a dark area. Replace any light bulbs as soon as they burn out. Set up your furniture so you have a clear path. Avoid moving your furniture around. If any of your floors are uneven, fix them. If there are any pets around you, be aware of where they are. Review your medicines with your doctor. Some medicines can make you feel dizzy. This can increase your chance of falling. Ask your doctor what other things that you can do to help prevent falls. This information is not intended to replace advice given to you by your health care provider. Make sure you discuss any questions you have with your health care provider. Document Released: 08/10/2009 Document Revised: 03/21/2016 Document Reviewed: 11/18/2014 Elsevier Interactive Patient Education  2017 [employer].

## 2024-01-06 NOTE — Progress Notes (Signed)
 Subjective:   Debra [name] is a 68 y.o. female who presents for Medicare Annual (Subsequent) preventive examination.  Visit Complete: In person  Cardiac Risk Factors include: advanced age (>35men, >63 women);dyslipidemia;hypertension;diabetes mellitus;smoking/ tobacco exposure     Objective:    Today's Vitals   01/06/24 1501  BP: 120/71  Pulse: 78  Weight: 158 lb (71.7 kg)  Height: 5\' 3"  (1.6 m)   Body mass index is 27.99 kg/m.     01/06/2024    3:15 PM 12/05/2023    2:44 PM 10/06/2023    2:19 PM 06/04/2023   11:33 AM 03/13/2023    7:59 AM 03/06/2023    1:23 PM 12/31/2022    1:42 PM  Advanced Directives  Does Patient Have a Medical Advance Directive? No No No No No No No  Would patient like information on creating a medical advance directive? No - Patient declined No - Patient declined Yes (MAU/Ambulatory/Procedural Areas - Information given) No - Patient declined No - Patient declined No - Patient declined No - Patient declined    Current Medications (verified) Outpatient Encounter Medications as of 01/06/2024  Medication Sig   Blood Glucose Monitoring Suppl DEVI 1 each by Does not apply route in the morning, at noon, and at bedtime. May substitute to any manufacturer covered by patient's insurance.   Glucose Blood (BLOOD GLUCOSE TEST STRIPS) STRP 1 each by In Vitro route in the morning, at noon, and at bedtime. May substitute to any manufacturer covered by patient's insurance.   Lancet Device MISC 1 each by Does not apply route in the morning, at noon, and at bedtime. May substitute to any manufacturer covered by patient's insurance.   Lancets Misc. MISC 1 each by Does not apply route in the morning, at noon, and at bedtime. May substitute to any manufacturer covered by patient's insurance.   Accu-Chek Softclix Lancets lancets USE TO CHECK BLOOD GLUCOSE ONCE A DAY   ammonium lactate (LAC-HYDRIN) 12 % lotion Apply 1 Application topically as needed for dry skin.   azelastine  (ASTELIN) 0.1 % nasal spray Place 2 sprays into both nostrils 2 (two) times daily. Use in each nostril as directed   donepezil (ARICEPT) 5 MG tablet Take 1 tablet (5 mg total) by mouth daily.   doxycycline (VIBRA-TABS) 100 MG tablet Take 1 tablet (100 mg total) by mouth 2 (two) times daily.   fluticasone (FLONASE) 50 MCG/ACT nasal spray Place 2 sprays into both nostrils daily.   gabapentin (NEURONTIN) 600 MG tablet TAKE 1 TABLET BY MOUTH 3 TIMES  DAILY   hydrochlorothiazide (HYDRODIURIL) 25 MG tablet TAKE 1 TABLET BY MOUTH DAILY   levothyroxine (SYNTHROID) 50 MCG tablet Take 1 tablet (50 mcg total) by mouth daily.   losartan (COZAAR) 100 MG tablet TAKE 1 TABLET BY MOUTH DAILY   metoprolol succinate (TOPROL-XL) 100 MG 24 hr tablet TAKE 1 TABLET BY MOUTH DAILY  WITH OR IMMEDIATELY FOLLOWING A  MEAL   montelukast (SINGULAIR) 10 MG tablet Take 1 tablet (10 mg total) by mouth at bedtime.   oxybutynin (DITROPAN-XL) 10 MG 24 hr tablet Take 1 tablet (10 mg total) by mouth at bedtime.   PARoxetine (PAXIL) 40 MG tablet Take 1 tablet (40 mg total) by mouth every morning.   pravastatin (PRAVACHOL) 40 MG tablet Take 1 tablet (40 mg total) by mouth daily.   saccharomyces boulardii (FLORASTOR) 250 MG capsule Take 1 capsule (250 mg total) by mouth 2 (two) times daily.   Semaglutide, 1 MG/DOSE, (  OZEMPIC, 1 MG/DOSE,) 4 MG/3ML SOPN Inject 1 mg into the skin once a week.   [DISCONTINUED] Blood Glucose Monitoring Suppl (ACCU-CHEK GUIDE) w/Device KIT Use to check blood glucose daily (DX: type 2 DM E 11.65)   [DISCONTINUED] glucose blood (ACCU-CHEK GUIDE) test strip USE TO CHECK BLOOD GLUCOSE ONCE A DAY. MAY SUBSTITUTE GLUCOMETER THAT IS PREFERRED BY PATIENTS INSURANCE   [DISCONTINUED] nicotine (NICODERM CQ - DOSED IN MG/24 HOURS) 14 mg/24hr patch Place 1 patch (14 mg total) onto the skin daily. Remove and apply new patch each morning.   [DISCONTINUED] NON FORMULARY [facility]  Scar Cream -  Verapamil 10%,  Pentoxifylline 5% Apply 1-2 grams to affected area 3-4 times daily Qty. 120 gm 3 refills   No facility-administered encounter medications on file as of 01/06/2024.    Allergies (verified) Penicillins and Codeine   History: Past Medical History:  Diagnosis Date   Abdominal pain, right upper quadrant 04/30/2007   Allergies 02/03/2019   Allergy    Anemia, iron deficiency 04/17/2017   Aortic atherosclerosis 10/10/2017   Atypical chest pain 08/31/2014   Back pain with radiculopathy 01/25/2010   Check xray --- ? Cause of leg weakness   Chronic pain of both shoulders 10/31/2020   Constipation    DDD (degenerative disc disease)    Diabetes mellitus type II, uncontrolled 01/11/2020   Diastolic dysfunction 02/15/2010   Epilepsy    Childhood seziures; pt reports that she grew out of them and they have not occurred since childhood   Essential hypertension 08/31/2010   Poorly controlled will alter medications, encouraged DASH diet, minimize caffeine and obtain adequate sleep. Report concerning symptoms and follow up as directed and as needed [employer] metoprolol Edema may be c   Fatigue 01/25/2010   Generalized anxiety disorder 04/22/2018   Stable co'nt meds   GERD (gastroesophageal reflux disease) 07/16/2007   Hearing loss 07/24/2012   Refer to hearing clinic   Hiatal hernia 07/16/2007   Hyperglycemia, fasting 05/04/2010   Hyperlipidemia 04/17/2017   Encouraged heart healthy diet, increase exercise, avoid trans fats, consider a krill oil cap daily   Hypothyroidism 11/02/2010   con't meds; Lab Results Component Value TSH 1.44 08/12/2017   Insomnia 04/30/2007   Knee pain 07/16/2007   Leg pain, bilateral 11/17/2008   Low back pain 07/16/2007   Major depressive disorder 01/30/2018   con't meds F/u counselor   Mixed connective tissue disease    with Raynaud's   Obstructive sleep apnea 01/30/2018   F/u with pulm; May be cause of some of her memory/concentration problems    Osteoarthritis    Osteoporosis    Palpitations 02/12/2008   Pansinusitis 01/30/2018   Peripheral vertigo 04/30/2007   Resolved rto prn   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   Family History  Problem Relation Age of Onset   Breast cancer Mother        possibly 21's   Arthritis Mother  rheumatoid   Cancer Mother        breast   Heart disease Mother 52       MI   Hyperlipidemia Mother    Thyroid disease Mother    Depression Mother    Anxiety disorder Mother    Heart disease Father        MI   Hypertension Father    Stroke Father    Diabetes Sister    Hypertension Sister    Hyperlipidemia Sister    Memory loss Maternal Aunt    Colon cancer Maternal Grandfather    Esophageal cancer Neg Hx    Rectal cancer Neg Hx    Stomach cancer Neg Hx    Social History   Socioeconomic History   Marital status: Legally Separated    Spouse name: Not on file   Number of children: 3   Years of education: 14   Highest education level: Some college, no degree  Occupational History   Occupation: Security  Tobacco Use   Smoking status: Every Day    Current packs/day: 0.75    Average packs/day: 0.8 packs/day for 44.0 years (33.0 ttl pk-yrs)    Types: Cigarettes   Smokeless tobacco: Never  Substance and Sexual Activity   Alcohol use: Yes    Comment: 2-3 drinks per week (beer/wine)   Drug use: No   Sexual activity: Yes    Partners: Male    Birth control/protection: Post-menopausal  Other Topics Concern   Not on file  Social History Narrative   Lives alone    Currently working   Drinks caffeine   Two floor home   ambidextrous   Social Drivers of Health   Financial Resource Strain: Low Risk  (01/06/2024)   Overall Financial Resource Strain (CARDIA)    Difficulty of Paying Living Expenses: Not very hard  Food Insecurity: No Food Insecurity (01/06/2024)   Hunger Vital Sign    Worried About Running Out of Food in the Last Year: Never true    Ran Out of Food in the Last Year: Never true  Transportation Needs: No Transportation Needs (01/06/2024)   PRAPARE - Administrator, Civil Service (Medical): No    Lack of Transportation (Non-Medical): No  Physical Activity: Inactive (01/06/2024)   Exercise Vital Sign    Days of Exercise per Week: 0 days    Minutes of Exercise per Session: 0 min  Stress: Stress Concern Present (01/06/2024)   [employer] of Occupational Health - Occupational Stress Questionnaire    Feeling of Stress : Very much  Social Connections: Moderately Isolated (01/06/2024)   Social Connection and Isolation Panel [NHANES]    Frequency of Communication with Friends and Family: More than three times a week    Frequency of Social Gatherings with Friends and Family: Once a week    Attends Religious Services: More than 4 times per year    Active Member of [employer] or Organizations: No    Attends Banker Meetings: Never    Marital Status: Separated    Tobacco Counseling Ready to quit: Not Answered Counseling given: Not Answered   Clinical Intake:  Pre-visit preparation completed: Yes  Pain : No/denies pain  BMI - recorded: 27.99 Nutritional Status: BMI 25 -29 Overweight Nutritional Risks: None Diabetes: Yes CBG done?: No Did pt. bring in CBG monitor from home?: No  How often do you need to have someone help you when you read instructions, pamphlets, or other written materials from your doctor  or pharmacy?: 1 - Never  Interpreter Needed?: No  Information entered by :: Donne Anon,  CMA   Activities of Daily Living    01/06/2024    3:06 PM  In your present state of health, do you have any difficulty performing the following activities:  Hearing? 1  Vision? 1  Difficulty concentrating or making decisions? 1  Walking or climbing stairs? 1  Dressing or bathing? 0  Doing errands, shopping? 0  Preparing Food and eating ? N  Using the Toilet? N  In the past six months, have you accidently leaked urine? Y  Do you have problems with loss of bowel control? N  Managing your Medications? N  Managing your Finances? N  Housekeeping or managing your Housekeeping? N    Patient Care Team: Zola Button, Grayling Congress, DO as PCP - General Jodelle Red, MD as PCP - Cardiology (Cardiology) Hilda Lias, MD as Consulting Physician (Neurosurgery) Coralyn Helling, MD (Inactive) as Consulting Physician (Pulmonary Disease) Henrene Pastor, RPH-CPP (Pharmacist) Randa Spike Kelton Pillar, LCSW as [facility] Care Management (Licensed Clinical Social Worker) Pa, Technical sales engineer Ophthalmology  Indicate any recent Medical Services you may have received from other than [facility] providers in the past year (date may be approximate).     Assessment:   This is a routine wellness examination for [place].  Hearing/Vision screen No results found.   Goals Addressed   None    Depression Screen    01/06/2024    3:23 PM 10/06/2023    2:20 PM 10/06/2023    2:19 PM 12/31/2022    1:46 PM 08/19/2022    2:42 PM 08/13/2022   11:37 AM 08/09/2022    1:24 PM  PHQ 2/9 Scores  PHQ - 2 Score 3 3 0 5 2 2 1   PHQ- 9 Score 16 19  15 9 8      Fall Risk    01/06/2024    3:14 PM 12/05/2023    2:43 PM 10/06/2023    2:19 PM 06/04/2023   11:34 AM 03/13/2023    7:58 AM  Fall Risk   Falls in the past year? 1 1 0 0 0  Number falls in past yr: 1 0 0 0 0  Injury with Fall? 1 0 0 0 0  Risk for fall due to : History of fall(s)      Follow up Falls evaluation completed Falls evaluation completed  Falls evaluation  completed Falls evaluation completed    MEDICARE RISK AT HOME: Medicare Risk at Home Any stairs in or around the home?: Yes If so, are there any without handrails?: No Home free of loose throw rugs in walkways, pet beds, electrical cords, etc?: No Adequate lighting in your home to reduce risk of falls?: Yes Life alert?: No Use of a cane, walker or w/c?: No Grab bars in the bathroom?: Yes Shower chair or bench in shower?: No Elevated toilet seat or a handicapped toilet?: No  TIMED UP AND GO:  Was the test performed?  Yes  Length of time to ambulate 10 feet: 6 sec Gait steady and fast without use of assistive device    Cognitive Function:    01/06/2024    3:31 PM 12/05/2023    2:00 PM  MMSE - Mini Mental State Exam  Not completed: Unable to complete   Orientation to time  5  Orientation to Place  5  Registration  3  Attention/ Calculation  5  Recall  2  Language- name 2 objects  2  Language- repeat  1  Language- follow 3 step command  3  Language- read & follow direction  1  Write a sentence  1  Copy design  1  Total score  29      03/13/2023   12:00 PM  Montreal Cognitive Assessment   Visuospatial/ Executive (0/5) 4  Naming (0/3) 3  Attention: Read list of digits (0/2) 2  Attention: Read list of letters (0/1) 1  Attention: Serial 7 subtraction starting at 100 (0/3) 1  Language: Repeat phrase (0/2) 1  Language : Fluency (0/1) 0  Abstraction (0/2) 1  Delayed Recall (0/5) 3  Orientation (0/6) 6  Total 22  Adjusted Score (based on education) 22      12/31/2022    1:54 PM  6CIT Screen  What Year? 0 points  What month? 0 points  What time? 0 points  Count back from 20 0 points  Months in reverse 0 points  Repeat phrase 0 points  Total Score 0 points    Immunizations Immunization History  Administered Date(s) Administered   Fluad Quad(high Dose 65+) 08/30/2021   Influenza Whole 10/01/2007, 08/31/2010   Influenza,inj,Quad PF,6+ Mos 10/12/2013, 08/29/2015,  08/13/2016, 08/12/2017, 08/03/2018   Influenza-Unspecified 08/10/2019   PFIZER Comirnaty(Gray Top)Covid-19 Tri-Sucrose Vaccine 02/27/2021   PFIZER(Purple Top)SARS-COV-2 Vaccination 01/11/2020, 01/31/2020, 08/24/2020   Pfizer Covid-19 Vaccine Bivalent Booster 76yrs & up 08/29/2021   Pneumococcal Polysaccharide-23 02/27/2021   Td 11/02/2010, 09/27/2021   Tdap 08/03/2018   Zoster Recombinant(Shingrix) 04/17/2017, 06/17/2017   Zoster, Live 03/12/2016    TDAP status: Up to date  Flu Vaccine status: Declined, Education has been provided regarding the importance of this vaccine but patient still declined. Advised may receive this vaccine at local pharmacy or Health Dept. Aware to provide a copy of the vaccination record if obtained from local pharmacy or Health Dept. Verbalized acceptance and understanding.  Pneumococcal vaccine status: Due, Education has been provided regarding the importance of this vaccine. Advised may receive this vaccine at local pharmacy or Health Dept. Aware to provide a copy of the vaccination record if obtained from local pharmacy or Health Dept. Verbalized acceptance and understanding.  Covid-19 vaccine status: Information provided on how to obtain vaccines.   Qualifies for Shingles Vaccine? Yes   Zostavax completed Yes   Shingrix Completed?: Yes  Screening Tests Health Maintenance  Topic Date Due   Lung Cancer Screening  08/27/2019   Pneumonia Vaccine 72+ Years old (2 of 2 - PCV) 02/27/2022   MAMMOGRAM  06/20/2023   DEXA SCAN  06/20/2023   COVID-19 Vaccine (6 - 2024-25 season) 06/29/2023   Medicare Annual Wellness (AWV)  12/31/2023   INFLUENZA VACCINE  01/26/2024 (Originally 05/29/2023)   FOOT EXAM  02/11/2024   OPHTHALMOLOGY EXAM  04/08/2024   HEMOGLOBIN A1C  05/31/2024   Diabetic kidney evaluation - eGFR measurement  12/01/2024   Diabetic kidney evaluation - Urine ACR  12/01/2024   Colonoscopy  07/27/2031   DTaP/Tdap/Td (4 - Td or Tdap) 09/28/2031    Hepatitis C Screening  Completed   Zoster Vaccines- Shingrix  Completed   HPV VACCINES  Aged Out    Health Maintenance  Health Maintenance Due  Topic Date Due   Lung Cancer Screening  08/27/2019   Pneumonia Vaccine 21+ Years old (2 of 2 - PCV) 02/27/2022   MAMMOGRAM  06/20/2023   DEXA SCAN  06/20/2023   COVID-19 Vaccine (6 - 2024-25 season) 06/29/2023   Medicare Annual Wellness (AWV)  12/31/2023  Colorectal cancer screening: Type of screening: Colonoscopy. Completed 07/26/21. Repeat every 10 years  Mammogram status: Ordered 01/06/24. Pt provided with contact info and advised to call to schedule appt.   Bone Density status: Ordered 01/06/24. Pt provided with contact info and advised to call to schedule appt.  Lung Cancer Screening: (Low Dose CT Chest recommended if Age 41-80 years, 20 pack-year currently smoking OR have quit w/in 15years.) does qualify.   Lung Cancer Screening Referral: placed   Additional Screening:  Hepatitis C Screening: does qualify; Completed 08/29/15  Vision Screening: Recommended annual ophthalmology exams for early detection of glaucoma and other disorders of the eye. Is the patient up to date with their annual eye exam?  No  Who is the provider or what is the name of the office in which the patient attends annual eye exams? [facility] If pt is not established with a provider, would they like to be referred to a provider to establish care? No .   Dental Screening: Recommended annual dental exams for proper oral hygiene  Diabetic Foot Exam: Diabetic Foot Exam: Completed 02/11/23  Community Resource Referral / Chronic Care Management: CRR required this visit?  No   CCM required this visit?  No     Plan:     I have personally reviewed and noted the following in the patient's chart:   Medical and social history Use of alcohol, tobacco or illicit drugs  Current medications and supplements including opioid prescriptions. Patient is not  currently taking opioid prescriptions. Functional ability and status Nutritional status Physical activity Advanced directives List of other physicians Hospitalizations, surgeries, and ER visits in previous 12 months Vitals Screenings to include cognitive, depression, and falls Referrals and appointments  In addition, I have reviewed and discussed with patient certain preventive protocols, quality metrics, and best practice recommendations. A written personalized care plan for preventive services as well as general preventive health recommendations were provided to patient.     Donne Anon, CMA   01/06/2024   After Visit Summary: (In Person-Declined) Patient declined AVS at this time.  Nurse Notes: None

## 2024-01-13 ENCOUNTER — Encounter: Payer: Self-pay | Admitting: Family Medicine

## 2024-01-13 ENCOUNTER — Telehealth (HOSPITAL_BASED_OUTPATIENT_CLINIC_OR_DEPARTMENT_OTHER): Payer: Self-pay

## 2024-01-13 ENCOUNTER — Ambulatory Visit (INDEPENDENT_AMBULATORY_CARE_PROVIDER_SITE_OTHER): Admitting: Family Medicine

## 2024-01-13 ENCOUNTER — Other Ambulatory Visit: Payer: Self-pay | Admitting: Family Medicine

## 2024-01-13 VITALS — BP 128/80 | HR 78 | Temp 97.9°F | Resp 18 | Ht 63.0 in | Wt 158.4 lb

## 2024-01-13 DIAGNOSIS — F411 Generalized anxiety disorder: Secondary | ICD-10-CM | POA: Diagnosis not present

## 2024-01-13 DIAGNOSIS — R3589 Other polyuria: Secondary | ICD-10-CM | POA: Diagnosis not present

## 2024-01-13 DIAGNOSIS — G4700 Insomnia, unspecified: Secondary | ICD-10-CM

## 2024-01-13 DIAGNOSIS — N3946 Mixed incontinence: Secondary | ICD-10-CM

## 2024-01-13 DIAGNOSIS — N393 Stress incontinence (female) (male): Secondary | ICD-10-CM

## 2024-01-13 DIAGNOSIS — F418 Other specified anxiety disorders: Secondary | ICD-10-CM

## 2024-01-13 MED ORDER — TRAZODONE HCL 50 MG PO TABS: 25.0000 mg | ORAL_TABLET | Freq: Every evening | ORAL | 3 refills | Status: AC | PRN

## 2024-01-13 MED ORDER — OXYBUTYNIN CHLORIDE ER 10 MG PO TB24: 10.0000 mg | ORAL_TABLET | Freq: Every day | ORAL | 3 refills | Status: DC

## 2024-01-13 MED ORDER — DONEPEZIL HCL 5 MG PO TABS: 5.0000 mg | ORAL_TABLET | Freq: Every day | ORAL | 11 refills | Status: AC

## 2024-01-13 NOTE — Progress Notes (Signed)
 Established Patient Office Visit  Subjective   Patient ID: Debra [name], female    DOB: 1955/11/03  Age: 68 y.o. MRN: [id number]  Chief Complaint  Patient presents with   Medication Management    Pt states wanting to discuss changing or increasing dose with Paxil    HPI Discussed the use of AI scribe software for clinical note transcription with the patient, who gave verbal consent to proceed.  History of Present Illness   Debra [name] is a 68 year old female with depression and anxiety who presents with stress and dietary concerns.  She experiences significant stress and dietary challenges, attributing these to her current job and life circumstances. She finds it difficult to maintain a healthy diet due to her work schedule and stress levels, which is exacerbated by her diabetes. Despite receiving dietary guidelines and a book from a nutritionist, she struggles to adhere to them due to fatigue and time constraints.  Her mental health is affected by both anxiety and depression, with a tendency towards depression. She has difficulty getting out of bed, lacks sleep, and feels 'nervous' at night. Currently on the highest dose of Paxil (40 mg), she feels it is not adequately managing her symptoms. She also experienced a spiritual event during Christmas that initially brought peace but was followed by increased stress.  She experiences incontinence, which she describes as 'terrible' and notes it worsened after stopping oxybutynin. Previous exercises for incontinence were ineffective.  She has a history of memory issues and is unsure if she is currently taking donepezil, which was prescribed by neurology. She has not followed up with the neuropsychiatrist as planned.  She expresses concerns about her family dynamics, feeling that her children are becoming more like parents to her, which she finds depressing. She also feels unable to visit her new grandchild due to fatigue and financial  constraints.  She uses Benadryl to aid sleep, but it has become ineffective. She is also concerned about her phone's functionality, which affects her ability to receive important calls.      Patient Active Problem List   Diagnosis Date Noted   Memory impairment 06/04/2023   Type 2 diabetes mellitus with hyperglycemia, without long-term current use of insulin (HCC) 08/09/2022   Snapping lateral band due to ligament laxity 02/19/2022   Moderate episode of recurrent major depressive disorder (HCC) 01/01/2022   Polyuria 01/01/2022   Stress incontinence of urine 01/01/2022   Wound infection 10/04/2021   Wound of right ankle 09/27/2021   Trigger thumb, right thumb 09/13/2021   Pruritic dermatitis 05/25/2021   Vaginal discharge 05/25/2021   Balance disorder 05/25/2021   Dizziness 05/25/2021   Osteoarthritis    Vitamin D deficiency    Urinary frequency 10/31/2020   Chronic pain of both shoulders 10/31/2020   Female pattern baldness 10/06/2020   Myalgia 10/06/2020   Urge incontinence of urine 10/06/2020   History of diabetes mellitus, type II 01/11/2020   Allergies 02/03/2019   Acute vaginitis 04/22/2018   Morbid obesity 04/22/2018   Generalized anxiety disorder 04/22/2018   Obstructive sleep apnea 01/30/2018   Major depressive disorder 01/30/2018   Pansinusitis 01/30/2018   Aortic atherosclerosis 10/10/2017   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   Hyperlipidemia 04/17/2017   Anemia, iron deficiency 04/17/2017   Atypical chest pain 08/31/2014   Arm weakness 08/31/2014   Hearing loss 07/24/2012   Hypothyroidism 11/02/2010   Essential hypertension 08/31/2010   Hyperglycemia, fasting 05/04/2010   Diastolic dysfunction 02/15/2010   Back pain  with radiculopathy 01/25/2010   Fatigue 01/25/2010   Tobacco abuse 12/01/2008   Restless legs syndrome 12/01/2008   Leg pain, bilateral 11/17/2008   Palpitations 02/12/2008   GERD (gastroesophageal reflux disease) 07/16/2007   Hiatal hernia  07/16/2007   Knee pain 07/16/2007   Low back pain 07/16/2007   Peripheral vertigo 04/30/2007   Insomnia 04/30/2007   Abdominal pain, right upper quadrant 04/30/2007   Past Medical History:  Diagnosis Date   Abdominal pain, right upper quadrant 04/30/2007   Allergies 02/03/2019   Allergy    Anemia, iron deficiency 04/17/2017   Aortic atherosclerosis 10/10/2017   Atypical chest pain 08/31/2014   Back pain with radiculopathy 01/25/2010   Check xray --- ? Cause of leg weakness   Chronic pain of both shoulders 10/31/2020   Constipation    DDD (degenerative disc disease)    Diabetes mellitus type II, uncontrolled 01/11/2020   Diastolic dysfunction 02/15/2010   Epilepsy    Childhood seziures; pt reports that she grew out of them and they have not occurred since childhood   Essential hypertension 08/31/2010   Poorly controlled will alter medications, encouraged DASH diet, minimize caffeine and obtain adequate sleep. Report concerning symptoms and follow up as directed and as needed [employer] metoprolol Edema may be c   Fatigue 01/25/2010   Generalized anxiety disorder 04/22/2018   Stable co'nt meds   GERD (gastroesophageal reflux disease) 07/16/2007   Hearing loss 07/24/2012   Refer to hearing clinic   Hiatal hernia 07/16/2007   Hyperglycemia, fasting 05/04/2010   Hyperlipidemia 04/17/2017   Encouraged heart healthy diet, increase exercise, avoid trans fats, consider a krill oil cap daily   Hypothyroidism 11/02/2010   con't meds; Lab Results Component Value TSH 1.44 08/12/2017   Insomnia 04/30/2007   Knee pain 07/16/2007   Leg pain, bilateral 11/17/2008   Low back pain 07/16/2007   Major depressive disorder 01/30/2018   con't meds F/u counselor   Mixed connective tissue disease    with Raynaud's   Obstructive sleep apnea 01/30/2018   F/u with pulm; May be cause of some of her memory/concentration problems   Osteoarthritis    Osteoporosis    Palpitations 02/12/2008    Pansinusitis 01/30/2018   Peripheral vertigo 04/30/2007   Resolved rto prn   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   Social History   Tobacco Use   Smoking status: Every Day    Current packs/day: 0.75    Average packs/day: 0.8 packs/day for 44.0 years (33.0 ttl pk-yrs)    Types: Cigarettes   Smokeless tobacco: Never  Substance Use Topics   Alcohol use: Yes    Comment: 2-3 drinks per week (beer/wine)   Drug use: No   Social History   Socioeconomic History   Marital status: Legally Separated    Spouse name: Not on file   Number of children: 3   Years of education: 14   Highest education level: Some college, no degree  Occupational  History   Occupation: Security  Tobacco Use   Smoking status: Every Day    Current packs/day: 0.75    Average packs/day: 0.8 packs/day for 44.0 years (33.0 ttl pk-yrs)    Types: Cigarettes   Smokeless tobacco: Never  Substance and Sexual Activity   Alcohol use: Yes    Comment: 2-3 drinks per week (beer/wine)   Drug use: No   Sexual activity: Yes    Partners: Male    Birth control/protection: Post-menopausal  Other Topics Concern   Not on file  Social History Narrative   Lives alone   Currently working   Drinks caffeine   Two floor home   ambidextrous   Social Drivers of Health   Financial Resource Strain: Low Risk  (01/06/2024)   Overall Financial Resource Strain (CARDIA)    Difficulty of Paying Living Expenses: Not very hard  Food Insecurity: No Food Insecurity (01/06/2024)   Hunger Vital Sign    Worried About Running  Out of Food in the Last Year: Never true    Ran Out of Food in the Last Year: Never true  Transportation Needs: No Transportation Needs (01/06/2024)   PRAPARE - Administrator, Civil Service (Medical): No    Lack of Transportation (Non-Medical): No  Physical Activity: Inactive (01/06/2024)   Exercise Vital Sign    Days of Exercise per Week: 0 days    Minutes of Exercise per Session: 0 min  Stress: Stress Concern Present (01/06/2024)   [employer] of Occupational Health - Occupational Stress Questionnaire    Feeling of Stress : Very much  Social Connections: Moderately Isolated (01/06/2024)   Social Connection and Isolation Panel [NHANES]    Frequency of Communication with Friends and Family: More than three times a week    Frequency of Social Gatherings with Friends and Family: Once a week    Attends Religious Services: More than 4 times per year    Active Member of [employer] or Organizations: No    Attends Banker Meetings: Never    Marital Status: Separated  Intimate Partner Violence: Not At Risk (01/06/2024)   Humiliation, Afraid, Rape, and Kick questionnaire    Fear of Current or Ex-Partner: No    Emotionally Abused: No    Physically Abused: No    Sexually Abused: No   Family Status  Relation Name Status   Mother  Deceased at age 39   Father  Deceased at age 8   Sister  Alive   Youth worker  (Not Specified)   MGF  Deceased   Neg Hx  (Not Specified)  No partnership data on file   Family History  Problem Relation Age of Onset   Breast cancer Mother        possibly 32's   Arthritis Mother        rheumatoid   Cancer Mother        breast   Heart disease Mother 21       MI   Hyperlipidemia Mother    Thyroid disease Mother    Depression Mother    Anxiety disorder Mother    Heart disease Father        MI   Hypertension Father    Stroke Father    Diabetes Sister    Hypertension Sister    Hyperlipidemia Sister    Memory loss Maternal Aunt     Colon cancer Maternal Grandfather    Esophageal cancer Neg Hx    Rectal cancer Neg  Hx    Stomach cancer Neg Hx       Review of Systems  Constitutional:  Negative for chills, fever and malaise/fatigue.  HENT:  Negative for congestion and hearing loss.   Eyes:  Negative for blurred vision and discharge.  Respiratory:  Negative for cough, sputum production and shortness of breath.   Cardiovascular:  Negative for chest pain, palpitations and leg swelling.  Gastrointestinal:  Negative for abdominal pain, blood in stool, constipation, diarrhea, heartburn, nausea and vomiting.  Genitourinary:  Negative for dysuria, frequency, hematuria and urgency.  Musculoskeletal:  Negative for back pain, falls and myalgias.  Skin:  Negative for rash.  Neurological:  Negative for dizziness, sensory change, loss of consciousness, weakness and headaches.  Endo/Heme/Allergies:  Negative for environmental allergies. Does not bruise/bleed easily.  Psychiatric/Behavioral:  Negative for depression and suicidal ideas. The patient is not nervous/anxious and does not have insomnia.       Objective:     BP 128/80 (BP Location: Left Arm, Patient Position: Sitting, Cuff Size: Normal)   Pulse 78   Temp 97.9 F (36.6 C) (Oral)   Resp 18   Ht 5\' 3"  (1.6 m)   Wt 158 lb 6.4 oz (71.8 kg)   SpO2 97%   BMI 28.06 kg/m  BP Readings from Last 3 Encounters:  01/13/24 128/80  01/06/24 120/71  12/02/23 130/88   Wt Readings from Last 3 Encounters:  01/13/24 158 lb 6.4 oz (71.8 kg)  01/06/24 158 lb (71.7 kg)  12/02/23 157 lb 3.2 oz (71.3 kg)   SpO2 Readings from Last 3 Encounters:  01/13/24 97%  12/02/23 98%  06/04/23 96%      Physical Exam Vitals and nursing note reviewed.  Constitutional:      General: She is not in acute distress.    Appearance: Normal appearance. She is well-developed.  HENT:     Head: Normocephalic and atraumatic.  Eyes:     General: No scleral icterus.       Right eye: No discharge.         Left eye: No discharge.  Cardiovascular:     Rate and Rhythm: Normal rate and regular rhythm.     Heart sounds: No murmur heard. Pulmonary:     Effort: Pulmonary effort is normal. No respiratory distress.     Breath sounds: Normal breath sounds.  Musculoskeletal:        General: Normal range of motion.     Cervical back: Normal range of motion and neck supple.     Right lower leg: No edema.     Left lower leg: No edema.  Skin:    General: Skin is warm and dry.  Neurological:     Mental Status: She is alert and oriented to person, place, and time.  Psychiatric:        Mood and Affect: Mood normal.        Behavior: Behavior normal.        Thought Content: Thought content normal.        Judgment: Judgment normal.      No results found for any visits on 01/13/24.  Last CBC Lab Results  Component Value Date   WBC 6.6 12/02/2023   HGB 11.5 (L) 12/02/2023   HCT 33.8 (L) 12/02/2023   MCV 85.5 12/02/2023   MCH 28.5 08/09/2022   RDW 13.7 12/02/2023   PLT 278.0 12/02/2023   Last metabolic panel Lab Results  Component Value Date   GLUCOSE 78 12/02/2023  NA 140 12/02/2023   K 4.7 12/02/2023   CL 102 12/02/2023   CO2 30 12/02/2023   BUN 16 12/02/2023   CREATININE 0.74 12/02/2023   GFR 83.17 12/02/2023   CALCIUM 9.6 12/02/2023   PROT 7.4 12/02/2023   ALBUMIN 4.1 12/02/2023   LABGLOB 3.1 07/22/2018   AGRATIO 1.4 07/22/2018   BILITOT 0.7 12/02/2023   ALKPHOS 79 12/02/2023   AST 30 12/02/2023   ALT 23 12/02/2023   ANIONGAP 11 08/31/2014   Last lipids Lab Results  Component Value Date   CHOL 150 12/02/2023   HDL 50.30 12/02/2023   LDLCALC 79 12/02/2023   LDLDIRECT 126.3 06/06/2011   TRIG 102.0 12/02/2023   CHOLHDL 3 12/02/2023   Last hemoglobin A1c Lab Results  Component Value Date   HGBA1C 6.4 12/02/2023   Last thyroid functions Lab Results  Component Value Date   TSH 1.14 12/02/2023   T3TOTAL 52 (L) 07/22/2018   T4TOTAL 2.1 (L) 05/16/2020    Last vitamin D Lab Results  Component Value Date   VD25OH 51.40 12/02/2023   Last vitamin B12 and Folate Lab Results  Component Value Date   VITAMINB12 319 12/02/2023   FOLATE >23.9 03/13/2023      The 10-year ASCVD risk score (Arnett DK, et al., 2019) is: 34.1%    Assessment & Plan:   Problem List Items Addressed This Visit       Unprioritized   Generalized anxiety disorder   Stress incontinence of urine - Primary   Relevant Medications   oxybutynin (DITROPAN-XL) 10 MG 24 hr tablet   Other Relevant Orders   Ambulatory referral to Urogynecology   Polyuria   Relevant Medications   oxybutynin (DITROPAN-XL) 10 MG 24 hr tablet   Other Visit Diagnoses       Mixed incontinence urge and stress         Assessment and Plan    Depression and Anxiety   She experiences increased stress, insomnia, and feelings of depression and anxiety. Currently taking 40 mg of Paxil, the maximum single tablet dose. She is interested in ashwagandha for stress relief and spiritual counseling. Concerns about the cost of psychiatric care are noted, with [facility] offering affordable counseling on a sliding scale. Continue Paxil 40 mg. Consider ashwagandha for stress relief. Refer to [facility] for counseling. Discuss potential referral to a psychiatrist for medication management.  Diabetes Mellitus   She has difficulty adhering to nutritional guidelines due to stress and work schedule, using pre-packaged meals like Lean Cuisine and [employer] but is concerned about high sodium content. Encourage continued dietary management and follow-up with a nutritionist. Consider alternatives to high-sodium pre-packaged meals.  Incontinence   Worsening incontinence occurred after discontinuing oxybutynin due to a disrupted schedule, with significant symptoms present. A referral to a urogynecologist for further management was discussed, explaining age and postpartum-related progression and  additional treatment options if medication is ineffective. Restart oxybutynin. Refer to a urogynecologist for further evaluation and management.  Memory Concerns   She has concerns about memory and is uncertain regarding the current use of donepezil, prescribed by neurology. A neurology follow-up is planned for August, and a neuropsychiatrist appointment is scheduled for June. Follow up with neurology regarding donepezil and memory concerns. Ensure follow-up with a neuropsychiatrist in June.  General Health Maintenance   She is due for a mammogram and bone density screening, with phone issues potentially affecting appointment reminders. Schedule mammogram and bone density screening.  Follow-up   She requires  follow-up with various specialists for ongoing health concerns. Follow up with an eye doctor for a diabetic eye exam. Follow up with [facility] in August.        Return if symptoms worsen or fail to improve.    [name], DO

## 2024-01-15 ENCOUNTER — Ambulatory Visit (HOSPITAL_BASED_OUTPATIENT_CLINIC_OR_DEPARTMENT_OTHER)
Admission: RE | Admit: 2024-01-15 | Discharge: 2024-01-15 | Disposition: A | Discharge status: Home / Self Care | Source: Ambulatory Visit | Attending: Family Medicine | Admitting: Family Medicine

## 2024-01-15 ENCOUNTER — Encounter (HOSPITAL_BASED_OUTPATIENT_CLINIC_OR_DEPARTMENT_OTHER): Payer: Self-pay

## 2024-01-15 DIAGNOSIS — Z780 Asymptomatic menopausal state: Secondary | ICD-10-CM | POA: Diagnosis not present

## 2024-01-15 DIAGNOSIS — Z1231 Encounter for screening mammogram for malignant neoplasm of breast: Secondary | ICD-10-CM | POA: Insufficient documentation

## 2024-01-17 ENCOUNTER — Encounter: Payer: Self-pay | Admitting: Family Medicine

## 2024-01-20 ENCOUNTER — Other Ambulatory Visit: Payer: Self-pay

## 2024-01-20 DIAGNOSIS — F17210 Nicotine dependence, cigarettes, uncomplicated: Secondary | ICD-10-CM

## 2024-01-20 DIAGNOSIS — Z87891 Personal history of nicotine dependence: Secondary | ICD-10-CM

## 2024-01-20 DIAGNOSIS — Z122 Encounter for screening for malignant neoplasm of respiratory organs: Secondary | ICD-10-CM

## 2024-01-29 ENCOUNTER — Other Ambulatory Visit: Payer: Self-pay | Admitting: Family Medicine

## 2024-02-10 ENCOUNTER — Telehealth: Payer: Self-pay

## 2024-02-10 NOTE — Telephone Encounter (Unsigned)
 Copied from CRM [id number]. Topic: Clinical - Medication Question >> Feb 10, 2024  3:35 PM Lotus Round B wrote: Reason for CRM: pt called in with questions for 2 medications Semaglutide, 1 MG/DOSE, (OZEMPIC, 1 MG/DOSE,) 4 MG/3ML SOPN, traZODone (DESYREL) 50 MG tablet , if a nurse can give her a call back .

## 2024-02-11 NOTE — Telephone Encounter (Signed)
 Pt called LVM to call back to discuss medications

## 2024-02-20 NOTE — Progress Notes (Signed)
 Diabetes Self-Management Education  Visit Type:  Follow-up  Appt. Start Time: 1200 Appt. End Time: 1231  02/27/2024  [name], identified by name and date of birth, is a 68 y.o. female with a diagnosis of Diabetes:  .     ASSESSMENT  Pt arrived to her appointment late today. Patient is here alone for follow up. Pt reports she is taking GLP on Tuesdays weekly. Pt reports poor sleep. Pt reports ongoing confusion. Pt reports she is feeling down and having a lot going on stating "stress". Pt reports her goal of PA three times weekly has been challenging and states she plan to continue to attempt to engage. Pt reports time management and poor sleep have been her largest challenges. Pt reports 1 after meals blood sugar range "100-145" and states she is concerned that her blood sugar could be higher. RD encouraged Pt to test after meals to determine impact on glucose. RD reviewed balanced meals and snacks and explored options with patient today. RD explored options to assist Pt with increasing her physical activity goal in addition. All Pt's questions were answered during today's encounter.   Wt Readings from Last 3 Encounters:  01/13/24 158 lb 6.4 oz (71.8 kg)  01/06/24 158 lb (71.7 kg)  12/02/23 157 lb 3.2 oz (71.3 kg)    There were no vitals taken for this visit. There is no height or weight on file to calculate BMI.    Diabetes Self-Management Education - 02/27/24 1205       Health Coping   How would you rate your overall health? Other (comment)   idk     Psychosocial Assessment   Patient Belief/Attitude about Diabetes Motivated to manage diabetes    Self-care barriers Other (comment)   confusion   Self-management support Doctor's office      Complications   How often do you check your blood sugar? 1-2 times/day    Fasting Blood glucose range (mg/dL) 16-109    Postprandial Blood glucose range (mg/dL) 604-540    Number of hypoglycemic episodes per month 0      Dietary  Intake   Breakfast oatmeal    Lunch banana    Dinner sweet potato, kale, breaded wings    Snack (evening) graham crackers    Beverage(s) coffee plain, water, milkshakes, frappes      Activity / Exercise   Activity / Exercise Type ADL's      Patient Education   Previous Diabetes Education Yes (please comment)    Healthy Eating Plate Method;Reviewed blood glucose goals for pre and post meals and how to evaluate the patients' food intake on their blood glucose level.    Being Active Role of exercise on diabetes management, blood pressure control and cardiac health.    Monitoring Identified appropriate SMBG and/or A1C goals.    Chronic complications Lipid levels, blood glucose control and heart disease    Diabetes Stress and Support Worked with patient to identify barriers to care and solutions;Identified and addressed patients feelings and concerns about diabetes    Preconception care --   n/a   Lifestyle and Health Coping Lifestyle issues that need to be addressed for better diabetes care      Individualized Goals (developed by patient)   Nutrition Follow meal plan discussed    Physical Activity Exercise 3-5 times per week    Medications take my medication as prescribed    Monitoring  Test my blood glucose as discussed    Problem Solving Eating  Pattern    Reducing Risk examine blood glucose patterns    Health Coping Ask for help with psychological, social, or emotional issues      Patient Self-Evaluation of Goals - Patient rates self as meeting previously set goals (% of time)   Nutrition 50 - 75 % (half of the time)    Physical Activity < 25% (hardly ever/never)    Medications 50 - 75 % (half of the time)    Monitoring 50 - 75 % (half of the time)    Problem Solving and behavior change strategies  50 - 75 % (half of the time)    Reducing Risk (treating acute and chronic complications) 50 - 75 % (half of the time)    Health Coping 50 - 75 % (half of the time)      Post-Education  Assessment   Patient understands the diabetes disease and treatment process. Needs Review    Patient understands incorporating nutritional management into lifestyle. Needs Review    Patient undertands incorporating physical activity into lifestyle. Needs Review    Patient understands using medications safely. Demonstrates understanding / competency    Patient understands monitoring blood glucose, interpreting and using results Comprehends key points    Patient understands prevention, detection, and treatment of acute complications. Comprehends key points    Patient understands prevention, detection, and treatment of chronic complications. Comprehends key points    Patient understands how to develop strategies to address psychosocial issues. Needs Review    Patient understands how to develop strategies to promote health/change behavior. Needs Review      Outcomes   Program Status Not Completed      Subsequent Visit   Since your last visit have you continued or begun to take your medications as prescribed? Yes             Learning Objective:  Patient will have a greater understanding of diabetes self-management. Patient education plan is to attend individual and/or group sessions per assessed needs and concerns.   Plan:   Patient Instructions  Test after breakfast to determine the best choices Aim for physical activity 3 days try videos or walking as tolerated    Expected Outcomes:  Other (comment) (barrier of confusion)  Education material reviewed: My Plate  If problems or questions, patient to contact team via:  Phone  Future DSME appointment: - 6 months

## 2024-02-25 ENCOUNTER — Ambulatory Visit (HOSPITAL_COMMUNITY): Payer: Self-pay | Admitting: Family

## 2024-02-27 ENCOUNTER — Encounter: Payer: 59 | Attending: Family Medicine | Admitting: Dietician

## 2024-02-27 DIAGNOSIS — E1165 Type 2 diabetes mellitus with hyperglycemia: Secondary | ICD-10-CM | POA: Diagnosis not present

## 2024-02-27 NOTE — Patient Instructions (Addendum)
 Test after breakfast to determine the best choices Aim for physical activity 3 days try videos or walking as tolerated

## 2024-03-16 ENCOUNTER — Ambulatory Visit (HOSPITAL_BASED_OUTPATIENT_CLINIC_OR_DEPARTMENT_OTHER): Payer: Self-pay | Admitting: Family

## 2024-03-16 ENCOUNTER — Encounter (HOSPITAL_COMMUNITY): Payer: Self-pay | Admitting: Family

## 2024-03-16 ENCOUNTER — Other Ambulatory Visit: Payer: Self-pay

## 2024-03-16 VITALS — BP 108/69 | HR 76 | Ht 63.4 in | Wt 155.0 lb

## 2024-03-16 DIAGNOSIS — F331 Major depressive disorder, recurrent, moderate: Secondary | ICD-10-CM | POA: Diagnosis not present

## 2024-03-16 NOTE — Progress Notes (Signed)
 Psychiatric Initial Adult Assessment   Patient Identification: Debra [name] MRN:  [id number] Date of Evaluation:  03/16/2024 Referral Source: Crecencio Dodge DO Chief Complaint:  "Deep Depression" and  grief and loss  Visit Diagnosis:    ICD-10-CM   1. Moderate episode of recurrent major depressive disorder (HCC)  F33.1       History of Present Illness:  Debra [name] 68 year old African-American female presents to establish care.  States she was referred by her primary care provider Dr. Crecencio Dodge.  Initially patient reported she was unsure why she was referred.  Reports she has been struggling with depression for about a year.  States she has always had anxiety issues.  States she is currently prescribed Paxil  40 mg daily.  Reports " I take medications when I remember."  Per documentation on patient registration patient states " I am not able to sleep, cannot remember or concentrating on a lot of things and."  Debra [name] denied previous inpatient admissions.  Denied suicidal or homicidal ideations.  Denied auditory or visual hallucinations.  Has a documented medical history related to memory issues, hypertension, diabetes and thyroid  disorder.  She reports her current symptoms include creased depression, anxiety, memory issues, sleep disturbance, poor concentration, weight loss.  She reports more recently struggling with allowing others to help her with her physical and mental health.  States her 2 younger siblings have been helping her and she does not like depending on them. "  This is not where I thought I would be in life."  Reports some unresolved grief and loss related to the passing of her mother and father.  Patient alludes to a history related to ex-husband and having unresolved traumatic experience.  States she has 3 adult boys.  1 of which resides in [place].  States she frequents that city often to see her grandchildren.  Memory recall during this assessment.  Patient was asked to  remember at the start of the assessment "yellow, ball and [place]" patient was able to cite and ball [place].  2 /3 on memory recall.  PHQ-9 17 GAD-7 6  Debra [name] denied illicit drug use or substance abuse history.  Throughout this assessment patient continues to report I cannot remember, or I do not know.  She reports she had been followed by therapy in the past however was not consistent because she did not see the benefit.   Major depressive disorder: Generalized anxiety disorder - Patient to continue Paxil  40 mg daily, encouraged to utilize pillbox and/or pill packaging for medication management consistently. - Discussed adding adjunct therapy however would like patient to take current antidepressant daily. - Discussed following up with individual therapy and/or consideration for unresolved grief and loss sessions - Patient to follow-up 1 month for medication management.  During evaluation Debra [name] is sitting, she is alert/oriented x 3; calm/cooperative; and mood congruent with affect.  Patient is speaking in a clear tone at moderate volume, and normal pace; with good eye contact.   Her thought process is coherent and relevant; There is no indication that she is currently responding to internal/external stimuli or experiencing delusional thought content.  Patient denies suicidal/self-harm/homicidal ideation, psychosis, and paranoia.  Patient has remained calm throughout assessment and has answered questions appropriately.   Associated Signs/Symptoms: Depression Symptoms:  depressed mood, difficulty concentrating, anxiety, (Hypo) Manic Symptoms:  Distractibility, Irritable Mood, Anxiety Symptoms:  Excessive Worry, Psychotic Symptoms:  Hallucinations: None PTSD Symptoms: Had a traumatic exposure:  friend killed self  Past Psychiatric History:  Previous Psychotropic Medications: Yes   Substance Abuse History in the last 12 months:  No.  Consequences of Substance  Abuse: NA  Past Medical History:  Past Medical History:  Diagnosis Date   Abdominal pain, right upper quadrant 04/30/2007   Allergies 02/03/2019   Allergy    Anemia, iron deficiency 04/17/2017   Aortic atherosclerosis 10/10/2017   Atypical chest pain 08/31/2014   Back pain with radiculopathy 01/25/2010   Check xray --- ? Cause of leg weakness   Chronic pain of both shoulders 10/31/2020   Constipation    DDD (degenerative disc disease)    Diabetes mellitus type II, uncontrolled 01/11/2020   Diastolic dysfunction 02/15/2010   Epilepsy    Childhood seziures; pt reports that she grew out of them and they have not occurred since childhood   Essential hypertension 08/31/2010   Poorly controlled will alter medications, encouraged DASH diet, minimize caffeine and obtain adequate sleep. Report concerning symptoms and follow up as directed and as needed [employer] metoprolol  Edema may be c   Fatigue 01/25/2010   Generalized anxiety disorder 04/22/2018   Stable co'nt meds   GERD (gastroesophageal reflux disease) 07/16/2007   Hearing loss 07/24/2012   Refer to hearing clinic   Hiatal hernia 07/16/2007   Hyperglycemia, fasting 05/04/2010   Hyperlipidemia 04/17/2017   Encouraged heart healthy diet, increase exercise, avoid trans fats, consider a krill oil cap daily   Hypothyroidism 11/02/2010   con't meds; Lab Results Component Value TSH 1.44 08/12/2017   Insomnia 04/30/2007   Knee pain 07/16/2007   Leg pain, bilateral 11/17/2008   Low back pain 07/16/2007   Major depressive disorder 01/30/2018   con't meds F/u counselor   Mixed connective tissue disease    with Raynaud's   Obstructive sleep apnea 01/30/2018   F/u with pulm; May be cause of some of her memory/concentration problems   Osteoarthritis    Osteoporosis    Palpitations 02/12/2008   Pansinusitis 01/30/2018   Peripheral vertigo 04/30/2007   Resolved rto prn   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    Family Psychiatric History:   Family History:  Family History  Problem Relation Age of Onset   Breast cancer Mother        possibly 15's   Arthritis Mother        rheumatoid   Cancer Mother        breast   Heart disease Mother 60       MI   Hyperlipidemia Mother    Thyroid  disease Mother    Depression Mother    Anxiety disorder Mother    Heart disease Father        MI   Hypertension Father    Stroke Father    Diabetes Sister    Hypertension Sister    Hyperlipidemia Sister    Memory loss Maternal Aunt    Colon cancer Maternal Grandfather    Esophageal cancer Neg Hx  Rectal cancer Neg Hx    Stomach cancer Neg Hx     Social History:   Social History   Socioeconomic History   Marital status: Legally Separated    Spouse name: Not on file   Number of children: 3   Years of education: 14   Highest education level: Some college, no degree  Occupational History   Occupation: Security  Tobacco Use   Smoking status: Every Day    Current packs/day: 0.75    Average packs/day: 0.8 packs/day for 44.0 years (33.0 ttl pk-yrs)    Types: Cigarettes   Smokeless tobacco: Never  Substance and Sexual Activity   Alcohol use: Yes    Comment: 2-3 drinks per week (beer/wine)   Drug use: No   Sexual activity: Yes    Partners: Male    Birth control/protection: Post-menopausal  Other Topics Concern   Not on file  Social History Narrative   Lives alone   Currently working   Drinks caffeine   Two floor home   ambidextrous   Social Drivers of Health   Financial Resource Strain:  Low Risk  (01/06/2024)   Overall Financial Resource Strain (CARDIA)    Difficulty of Paying Living Expenses: Not very hard  Food Insecurity: No Food Insecurity (01/06/2024)   Hunger Vital Sign    Worried About Running Out of Food in the Last Year: Never true    Ran Out of Food in the Last Year: Never true  Transportation Needs: No Transportation Needs (01/06/2024)   PRAPARE - Administrator, Civil Service (Medical): No    Lack of Transportation (Non-Medical): No  Physical Activity: Inactive (01/06/2024)   Exercise Vital Sign    Days of Exercise per Week: 0 days    Minutes of Exercise per Session: 0 min  Stress: Stress Concern Present (01/06/2024)   [employer] of Occupational Health - Occupational Stress Questionnaire    Feeling of Stress : Very much  Social Connections: Moderately Isolated (01/06/2024)   Social Connection and Isolation Panel [NHANES]    Frequency of Communication with Friends and Family: More than three times a week    Frequency of Social Gatherings with Friends and Family: Once a week    Attends Religious Services: More than 4 times per year    Active Member of [employer] or Organizations: No    Attends Banker Meetings: Never    Marital Status: Separated    Additional Social History:   Allergies:   Allergies  Allergen Reactions   Penicillins    Codeine Rash and Other (See Comments)    dizziness    Metabolic Disorder Labs: Lab Results  Component Value Date   HGBA1C 6.4 12/02/2023   MPG 169 08/09/2022   MPG 140 04/20/2021   No results found for: "PROLACTIN" Lab Results  Component Value Date   CHOL 150 12/02/2023   TRIG 102.0 12/02/2023   HDL 50.30 12/02/2023   CHOLHDL 3 12/02/2023   VLDL 20.4 12/02/2023   LDLCALC 79 12/02/2023   LDLCALC 88 07/29/2023   Lab Results  Component Value Date   TSH 1.14 12/02/2023    Therapeutic Level Labs: No results found for: "LITHIUM" No results found for: "CBMZ" No results found for:  "VALPROATE"  Current Medications: Current Outpatient Medications  Medication Sig Dispense Refill   Accu-Chek Softclix Lancets lancets USE TO CHECK BLOOD GLUCOSE ONCE A DAY 100 each 12   ammonium lactate  (LAC-HYDRIN ) 12 % lotion Apply 1 Application topically as needed  for dry skin. 400 g 0   azelastine  (ASTELIN ) 0.1 % nasal spray Place 2 sprays into both nostrils 2 (two) times daily. Use in each nostril as directed 30 mL 5   Blood Glucose Monitoring Suppl DEVI 1 each by Does not apply route in the morning, at noon, and at bedtime. May substitute to any manufacturer covered by patient's insurance. 1 each 0   fluticasone  (FLONASE ) 50 MCG/ACT nasal spray Place 2 sprays into both nostrils daily. 16 g 6   gabapentin  (NEURONTIN ) 600 MG tablet TAKE 1 TABLET BY MOUTH 3 TIMES  DAILY 300 tablet 2   hydrochlorothiazide  (HYDRODIURIL ) 25 MG tablet TAKE 1 TABLET BY MOUTH DAILY 100 tablet 2   levothyroxine  (SYNTHROID ) 50 MCG tablet Take 1 tablet (50 mcg total) by mouth daily. 100 tablet 2   losartan  (COZAAR ) 100 MG tablet TAKE 1 TABLET BY MOUTH DAILY 100 tablet 2   metoprolol  succinate (TOPROL -XL) 100 MG 24 hr tablet TAKE 1 TABLET BY MOUTH DAILY  WITH OR IMMEDIATELY FOLLOWING A  MEAL 90 tablet 3   montelukast  (SINGULAIR ) 10 MG tablet Take 1 tablet (10 mg total) by mouth at bedtime. 90 tablet 0   oxybutynin  (DITROPAN -XL) 10 MG 24 hr tablet Take 1 tablet (10 mg total) by mouth at bedtime. 90 tablet 3   PARoxetine  (PAXIL ) 40 MG tablet Take 1 tablet (40 mg total) by mouth every morning. 90 tablet 3   pravastatin  (PRAVACHOL ) 40 MG tablet Take 1 tablet (40 mg total) by mouth daily. 90 tablet 1   Semaglutide , 1 MG/DOSE, (OZEMPIC , 1 MG/DOSE,) 4 MG/3ML SOPN Inject 1 mg into the skin once a week. 3 mL 2   traZODone  (DESYREL ) 50 MG tablet Take 0.5-1 tablets (25-50 mg total) by mouth at bedtime as needed for sleep. 30 tablet 3   donepezil  (ARICEPT ) 5 MG tablet Take 1 tablet (5 mg total) by mouth daily. (Patient not taking:  Reported on 03/16/2024) 30 tablet 11   saccharomyces boulardii (FLORASTOR) 250 MG capsule Take 1 capsule (250 mg total) by mouth 2 (two) times daily. (Patient not taking: Reported on 03/16/2024) 60 capsule 5   No current facility-administered medications for this visit.    Musculoskeletal: Strength & Muscle Tone: within normal limits Gait & Station: normal Patient leans: N/A  Psychiatric Specialty Exam: Review of Systems  Psychiatric/Behavioral:  Positive for decreased concentration and sleep disturbance. Negative for hallucinations, self-injury and suicidal ideas.   All other systems reviewed and are negative.   Blood pressure 108/69, pulse 76, height 5' 3.4" (1.61 m), weight 155 lb (70.3 kg).Body mass index is 27.11 kg/m.  General Appearance: Casual  Eye Contact:  Good  Speech:  Clear and Coherent  Volume:  Normal  Mood:  Anxious and Depressed  Affect:  Congruent  Thought Process:  Coherent  Orientation:  Full (Time, Place, and Person)  Thought Content:  Logical  Suicidal Thoughts:  No  Homicidal Thoughts:  No  Memory:  Immediate;   Fair Recent;   Good  Judgement:  Good  Insight:  Good  Psychomotor Activity:  Normal  Concentration:  Concentration: Fair  Recall:  Good  Fund of Knowledge:Good  Language: Good  Akathisia:  No  Handed:  Right  AIMS (if indicated):  not done  Assets:  Communication Skills Desire for Improvement  ADL's:  Intact  Cognition: document history with short term memory issues, during this assessment patient was asked to recall [place], [place] and yellow. 2/3 recall  Sleep:  Fair  Screenings: GAD-7    Flowsheet Row Care Coordination from 05/21/2022 in [facility] Office Visit from 09/21/2018 in [facility] Visit from 09/09/2018 in [facility] at [facility] Visit from 08/19/2018 in [facility] at [facility]   Total GAD-7 Score 8 6 7 12       Mini-Mental    Flowsheet Row Office Visit from 12/05/2023 in [facility] Neurology  Total Score (max 30 points ) 29      PHQ2-9    Flowsheet Row Clinical Support from 01/06/2024 in [facility] at [facility] Nutrition from 10/06/2023 in [facility]  - [facility] Clinical Support from 12/31/2022 in [facility] Primary Care at [facility] Coordination from 08/19/2022 in [facility] Community [facility] Coordination Care Coordination from 08/13/2022 in [facility] Community Care Coordination  PHQ-2 Total Score 3 3 5 2 2   PHQ-9 Total Score 16 19 15 9 8       Flowsheet Row UC from 03/22/2021 in [facility] at [facility] RISK CATEGORY No Risk       Assessment and Plan: Debra [name] 68 year old African-American female presents to establish care, she was referred by her primary care provider.  Has an extensive documented history related to memory issues currently followed by neurotherapy.  Denied previous inpatient admissions.  Denied history of illicit drug use or substance abuse history. Unable to recall if she is taking her medications as indicated.  Collaboration of Care: Medication Management AEB continue Paxil  40 mg daily. - May benefit from neuropsychiatry - Follow-up with therapy services individual  Patient/Guardian was advised Release of Information must be obtained prior to any record release in order to collaborate their care with an outside provider. Patient/Guardian was advised if they have not already done so to contact the registration department to sign all necessary forms in order for us  to release information regarding their care.   Consent: Patient/Guardian gives verbal consent for treatment and assignment of benefits for services provided during this visit. Patient/Guardian expressed understanding and  agreed to proceed.   Levester Reagin, NP 5/20/20257:36 PM

## 2024-04-07 ENCOUNTER — Institutional Professional Consult (permissible substitution): Payer: 59 | Admitting: Psychology

## 2024-04-07 ENCOUNTER — Ambulatory Visit: Payer: Self-pay

## 2024-04-07 NOTE — Telephone Encounter (Signed)
 Copied from CRM #[id number]. Topic: Clinical - Red Word Triage >> Apr 07, 2024  4:34 PM Chrystal Crape R wrote: Pt calling to resch however there are no appointments sooner thann her current appointment for hip pain after a fall last week [employer] [facility] [employer]   Reason for Disposition  [1] MODERATE pain (e.g., interferes with normal activities) AND [2] high-risk adult (e.g., age > 60 years, osteoporosis, chronic steroid use)  Answer Assessment - Initial Assessment Questions 1. MECHANISM: How did the injury happen?       Fall 2. ONSET: When did the injury happen? (Minutes or hours ago)      1 week ago 3. LOCATION: Where is the injury located?      Tailbone  4. SEVERITY: Can you sit? Can you walk?      Can walk and sit  5. PAIN: Is there pain? If Yes, ask: How bad is the pain? (e.g., Scale 1-10; mild, moderate, or severe)   - MILD (1-3): doesn't interfere with normal activities    - MODERATE (4-7): interferes with normal activities or awakens from sleep    - SEVERE (8-10): excruciating pain, unable to do any normal activities      9/10 6. SIZE: For bruises, or swelling, ask: How large is it? (e.g., inches or centimeters)      N/A 7. OTHER SYMPTOMS: Do you have any other symptoms? (e.g., numbness, back pain)     Left an and elbow pain  Protocols used: Tailbone Injury-A-AH   FYI Only or Action Required?: FYI only for provider  Patient was last seen in primary care on 01/13/2024 by Crecencio Dodge, Candida Chalk, DO. Called Nurse Triage reporting Hip Pain. Symptoms began a week ago.  Symptoms are: unchanged.  Triage Disposition: See Physician Within 24 Hours  Patient/caregiver understands and will follow disposition?: Yes

## 2024-04-08 ENCOUNTER — Encounter: Payer: Self-pay | Admitting: Physician Assistant

## 2024-04-08 ENCOUNTER — Ambulatory Visit (INDEPENDENT_AMBULATORY_CARE_PROVIDER_SITE_OTHER): Admitting: Physician Assistant

## 2024-04-08 VITALS — BP 81/51 | HR 96 | Temp 98.4°F | Resp 16 | Ht 63.0 in | Wt 157.1 lb

## 2024-04-08 DIAGNOSIS — M79602 Pain in left arm: Secondary | ICD-10-CM | POA: Diagnosis not present

## 2024-04-08 DIAGNOSIS — M533 Sacrococcygeal disorders, not elsewhere classified: Secondary | ICD-10-CM

## 2024-04-08 NOTE — Progress Notes (Signed)
 Established patient visit   Patient: Debra [name]   DOB: [birth year]   68 y.o. Female  MRN: [id number] Visit Date: 04/08/2024  Today's healthcare provider: Trenton Frock, PA-C   Chief Complaint  Patient presents with   Fall    X1 week ago, tailbone pain and L arm pain  Was in hotel room, was drying off after shower- and fell    Subjective     Pt reports stepping out of the shower onto a towel and slipped, fell on her bottom and left arm. She reports initially significant tailbone pain, unable to sit at all, couldn't move into car.  Since then her symptoms have improved, but she is still in pain.    Medications: Outpatient Medications Prior to Visit  Medication Sig   Accu-Chek Softclix Lancets lancets USE TO CHECK BLOOD GLUCOSE ONCE A DAY   ammonium lactate  (LAC-HYDRIN ) 12 % lotion Apply 1 Application topically as needed for dry skin.   azelastine  (ASTELIN ) 0.1 % nasal spray Place 2 sprays into both nostrils 2 (two) times daily. Use in each nostril as directed   Blood Glucose Monitoring Suppl DEVI 1 each by Does not apply route in the morning, at noon, and at bedtime. May substitute to any manufacturer covered by patient's insurance.   fluticasone  (FLONASE ) 50 MCG/ACT nasal spray Place 2 sprays into both nostrils daily.   gabapentin  (NEURONTIN ) 600 MG tablet TAKE 1 TABLET BY MOUTH 3 TIMES  DAILY   hydrochlorothiazide  (HYDRODIURIL ) 25 MG tablet TAKE 1 TABLET BY MOUTH DAILY   levothyroxine  (SYNTHROID ) 50 MCG tablet Take 1 tablet (50 mcg total) by mouth daily.   losartan  (COZAAR ) 100 MG tablet TAKE 1 TABLET BY MOUTH DAILY   metoprolol  succinate (TOPROL -XL) 100 MG 24 hr tablet TAKE 1 TABLET BY MOUTH DAILY  WITH OR IMMEDIATELY FOLLOWING A  MEAL   montelukast  (SINGULAIR ) 10 MG tablet Take 1 tablet (10 mg total) by mouth at bedtime.   oxybutynin  (DITROPAN -XL) 10 MG 24 hr tablet Take 1 tablet (10 mg total) by mouth at bedtime.   PARoxetine  (PAXIL ) 40 MG tablet Take 1 tablet (40  mg total) by mouth every morning.   pravastatin  (PRAVACHOL ) 40 MG tablet Take 1 tablet (40 mg total) by mouth daily.   Semaglutide , 1 MG/DOSE, (OZEMPIC , 1 MG/DOSE,) 4 MG/3ML SOPN Inject 1 mg into the skin once a week.   traZODone  (DESYREL ) 50 MG tablet Take 0.5-1 tablets (25-50 mg total) by mouth at bedtime as needed for sleep.   donepezil  (ARICEPT ) 5 MG tablet Take 1 tablet (5 mg total) by mouth daily. (Patient not taking: Reported on 04/08/2024)   saccharomyces boulardii (FLORASTOR) 250 MG capsule Take 1 capsule (250 mg total) by mouth 2 (two) times daily. (Patient not taking: Reported on 04/08/2024)   No facility-administered medications prior to visit.    Review of Systems  Constitutional:  Negative for fatigue and fever.  Respiratory:  Negative for cough and shortness of breath.   Cardiovascular:  Negative for chest pain and leg swelling.  Gastrointestinal:  Negative for abdominal pain.  Musculoskeletal:  Positive for arthralgias and myalgias.  Neurological:  Negative for dizziness and headaches.        Objective    BP (!) 81/51   Pulse 96   Temp 98.4 F (36.9 C) (Oral)   Resp 16   Ht 5' 3 (1.6 m)   Wt 157 lb 2 oz (71.3 kg)   SpO2 97%   BMI 27.83 kg/m  Did not repeat bp but likely inaccurate as pt was fully oriented without any sedating signs    Physical Exam Vitals reviewed.  Constitutional:      Appearance: She is not ill-appearing.  HENT:     Head: Normocephalic.   Eyes:     Conjunctiva/sclera: Conjunctivae normal.    Cardiovascular:     Rate and Rhythm: Normal rate.  Pulmonary:     Effort: Pulmonary effort is normal. No respiratory distress.   Musculoskeletal:     Comments: Bruising to upper L arm with tenderness. Full ROM of L arm w/o pain. No weakness of LUE.   Some tenderness to sacrum   Neurological:     Mental Status: She is alert and oriented to person, place, and time.   Psychiatric:        Mood and Affect: Mood normal.        Behavior:  Behavior normal.     No results found for any visits on 04/08/24.  Assessment & Plan    Sacrodynia  Left arm pain  Based on good mobility, improvement of pain, xrays not recommended at this time. Recommending tylenol , donut pillow.  If pain persists and does not continue to improve, recommend imaging  Return if symptoms worsen or fail to improve.       Trenton Frock, PA-C  [facility] at [facility] [phone] (phone) [phone] (fax)  [facility]

## 2024-04-09 ENCOUNTER — Ambulatory Visit: Admitting: Family Medicine

## 2024-04-09 ENCOUNTER — Encounter: Payer: Self-pay | Admitting: Psychology

## 2024-04-09 DIAGNOSIS — H25811 Combined forms of age-related cataract, right eye: Secondary | ICD-10-CM | POA: Diagnosis not present

## 2024-04-09 DIAGNOSIS — H40023 Open angle with borderline findings, high risk, bilateral: Secondary | ICD-10-CM | POA: Diagnosis not present

## 2024-04-09 DIAGNOSIS — E119 Type 2 diabetes mellitus without complications: Secondary | ICD-10-CM | POA: Diagnosis not present

## 2024-04-09 LAB — HM DIABETES EYE EXAM

## 2024-04-13 ENCOUNTER — Ambulatory Visit (INDEPENDENT_AMBULATORY_CARE_PROVIDER_SITE_OTHER): Payer: 59 | Admitting: Psychology

## 2024-04-13 ENCOUNTER — Ambulatory Visit: Payer: Self-pay

## 2024-04-13 ENCOUNTER — Encounter: Payer: Self-pay | Admitting: Psychology

## 2024-04-13 DIAGNOSIS — F801 Expressive language disorder: Secondary | ICD-10-CM | POA: Insufficient documentation

## 2024-04-13 DIAGNOSIS — R4189 Other symptoms and signs involving cognitive functions and awareness: Secondary | ICD-10-CM

## 2024-04-13 DIAGNOSIS — G3184 Mild cognitive impairment, so stated: Secondary | ICD-10-CM

## 2024-04-13 NOTE — Progress Notes (Signed)
   Psychometrician Note   Cognitive testing was administered to Debra [name] by Abbott Abbot, B.S. (psychometrist) under the supervision of Dr. Melville Stade, Ph.D., ABPP, licensed psychologist on 04/13/2024. [name] did not appear overtly distressed by the testing session per behavioral observation or responses across self-report questionnaires. Rest breaks were offered.    The battery of tests administered was selected by Dr. Zachary C. Merz, Ph.D., ABPP with consideration to [name]'s current level of functioning, the nature of her symptoms, emotional and behavioral responses during interview, level of literacy, observed level of motivation/effort, and the nature of the referral question. This battery was communicated to the psychometrist. Communication between Dr. Melville Stade, Ph.D., ABPP and the psychometrist was ongoing throughout the evaluation and Dr. Melville Stade, Ph.D., ABPP was immediately accessible at all times. Dr. Zachary C. Merz, Ph.D., ABPP provided supervision to the psychometrist on the date of this service to the extent necessary to assure the quality of all services provided.    Debra [name] will return within approximately 1-2 weeks for an interactive feedback session with Dr. Kitty Perkins at which time her test performances, clinical impressions, and treatment recommendations will be reviewed in detail. [name] understands she can contact our office should she require our assistance before this time.  A total of 165 minutes of billable time were spent face-to-face with [name] by the psychometrist. This includes both test administration and scoring time. Billing for these services is reflected in the clinical report generated by Dr. Melville Stade, Ph.D., ABPP  This note reflects time spent with the psychometrician and does not include test scores or any clinical interpretations made by Dr. Kitty Perkins. The full report will follow in a separate note.

## 2024-04-13 NOTE — Progress Notes (Signed)
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 related to math, at that time. History of developmental delay: Denied. History of grade repetition: Denied. Enrollment in special education courses: Denied. History of LD/ADHD: Denied.   Employment: She currently works part-time on the weekends in a security capacity.  Evaluation Results:   Behavioral Observations: Ms. Caridi was unaccompanied, arrived to her appointment on time, and was appropriately dressed and groomed. She appeared alert. Observed gait and station were within normal limits. She ambulated slowly and somewhat gingerly due to her recent fall and tailbone fracture. Gross motor functioning appeared intact upon informal observation and no abnormal movements (e.g., tremors) were noted. Her affect was somewhat restricted and she alluded to some discontent surrounding various stressors. She also readily expressed displeasure at the thought of completing an additional evaluation. Spontaneous speech was fluent and word finding difficulties were not observed during the clinical interview. Thought processes were coherent, organized, and normal  in content. Insight into her cognitive difficulties appeared adequate.   During testing, Ms. Shannahan exhibited numerous complaints, largely surrounding dry eyes, fatigue, and pain stemming from her recent fall. Similar to her interview, her behavior suggested a lack of desire to complete testing. Sustained attention was appropriate. Task engagement was largely adequate and she generally persisted when challenged. Testing need need to be slightly abbreviated due to waning tolerance. Overall, Ms. Capece was cooperative with the clinical interview and subsequent testing procedures.   Adequacy of Effort: The validity of neuropsychological testing is limited by the extent to which the individual being tested may be assumed to have exerted adequate effort during testing. Ms. Cimmino expressed her intention to perform to the best of her abilities and exhibited adequate task engagement and persistence. Scores across stand-alone and embedded performance validity measures were within expectation. As such, the results of the current evaluation are believed to be a valid representation of Ms. Ackroyd's current cognitive functioning.  Test Results: Ms. Felber was oriented at the time of the current evaluation.  Intellectual abilities based upon educational and vocational attainment  were estimated to be in the average range. Premorbid abilities were estimated to be within the below average range based upon a single-word reading test.   Processing speed was well below average to below average. Basic attention was below average. More complex attention (e.g., working memory) was above average. Executive functioning was exceptionally low to below average.  Assessed receptive language abilities were below average. Ms. Magallanes did not exhibit any difficulties comprehending task instructions and answered all questions asked of her appropriately. Assessed expressive language was exceptionally low to below average.      Assessed visuospatial/visuoconstructional abilities were average to exceptionally high.    Learning (i.e., encoding) of novel information was above average across a visual task but well below average to below average across all verbal tasks. Spontaneous delayed recall (i.e., retrieval) of previously learned information was variable, ranging from the exceptionally low to average normative ranges. Retention rates were 500% (SDF: 0; LDF: 5) across a list learning task, 58% across a story learning task, 93% across a daily living task, and 86% across a shape learning task. Performance across recognition tasks was average to above average, suggesting evidence for information consolidation.   Results of emotional screening instruments suggested that recent symptoms of generalized anxiety were in the moderate range, while symptoms of depression were also within the moderate range. A screening instrument assessing recent sleep quality suggested the presence of moderate sleep dysfunction.  Table of Scores:   Note: This summary of test scores accompanies the interpretive report and should not be considered in isolation without reference to the appropriate sections in the text. Descriptors are based on appropriate normative data and may be adjusted based on clinical judgment. Terms such as Within Normal Limits and Outside Normal Limits are used when a more specific description of the test score cannot be determined. Descriptors refer to the current evaluation only.        Percentile - Normative Descriptor > 98 - Exceptionally High 91-97 - Well Above Average 75-90 - Above Average 25-74 - Average 9-24 - Below Average 2-8 - Well Below Average < 2 - Exceptionally Low        Validity: April 2022 Current  DESCRIPTOR        DCT: --- --- --- Within Normal Limits  NAB EVI: --- --- --- Within Normal Limits        Orientation:       Raw Score Raw Score Percentile   NAB Orientation, Form 1 29/29 28/29 ---  ---        Cognitive Screening:       Raw Score Raw Score Percentile   SLUMS: 23/30 17/30 --- ---        Intellectual Functioning:       Standard Score Standard Score Percentile   Test of Premorbid Functioning: 86 83 13 Below Average        Memory:      NAB Memory Module, Form 1: Standard Score/ T Score Standard Score/ T Score Percentile   Total Memory Index 87 78 7 Well Below Average  List Learning        Total Trials 1-3 23/36 (50) 19/36 (42) 21 Below Average    List B 4/12 (48) 4/12 (48) 42 Average    Short Delay Free Recall 4/12 (34) 0/12 (19) <1 Exceptionally Low    Long Delay Free Recall 6/12 (44) 5/12 (40) 16 Below Average    Retention Percentage 150 (69) --- --- ---    Recognition Discriminability 6 (46)  6 (46) 34 Average  Shape Learning        Total Trials 1-3 19/27 (61) 20/27 (62) 88 Above Average    Delayed Recall 7/9 (60) 6/9 (53) 62 Average    Retention Percentage 100 (51) 86 (46) 34 Average    Recognition Discriminability 8 (57) 8 (57) 75 Above Average  Story Learning        Immediate Recall 30/80 (25) 41/80 (31) 3 Well Below Average    Delayed Recall 13/40 (29) 15/40 (29) 2 Well Below Average    Retention Percentage 65 (38) 58 (34) 5 Well Below Average  Daily Living Memory        Immediate Recall 42/51 (49) 33/51 (35) 7 Well Below Average    Delayed Recall 13/17 (45) 13/17 (45) 31 Average    Retention Percentage 76 (43) 93 (54) 66 Average    Recognition Hits 10/10 (60) 9/10 (52) 58 Average        Attention/Executive Function:      Trail Making Test (TMT): Raw Score (T Score) Raw Score (T Score) Percentile     Part A 47 secs.,  0 errors (44) 76 secs.,  4 errors (31) 3 Well Below Average    Part B 160 secs.,  1 error (41) Discontinued --- Impaired          Scaled Score Scaled Score Percentile   WAIS-IV Coding: 9 7 16  Below Average        NAB Attention Module, Form 1: T Score T Score Percentile     Digits Forward 53 40 16 Below Average    Digits  Backwards 43 58 79 Above Average         Scaled Score Scaled Score Percentile   WAIS-IV Similarities: 8 5 5  Well Below Average        D-KEFS Verbal Fluency Test: Raw Score (Scaled Score) Raw Score (Scaled Score) Percentile     Letter Total Correct --- 22 (6) 9 Below Average    Category Total Correct --- 22 (5) 5 Well Below Average    Category Switching Total Correct --- 9 (6) 9 Below Average    Category Switching Accuracy --- 6 (5) 5 Well Below Average      Total Set Loss Errors --- 0 (13) 84 Above Average      Total Repetition Errors --- 0 (13) 84 Above Average        Language:      Verbal Fluency Test: Raw Score (T Score) Raw Score (T Score) Percentile     Phonemic Fluency (FAS) 26 (44) 22 (40) 16 Below Average    Animal Fluency 8 (31) 10 (35) 7 Well Below Average         NAB Language Module, Form 1: T Score T Score Percentile     Oral Production --- 41 18 Below Average    Auditory Comprehension 30 40 16 Below Average    Naming 24/31 (25) 24/31 (25) 1 Exceptionally Low    Reading Comprehension --- 13/13 --- Within Normal Limits    Writing --- 39 14 Below Average        Visuospatial/Visuoconstruction:       Raw Score Raw Score Percentile   Clock Drawing: 10/10 9/10 --- Within Normal Limits        NAB Spatial Module, Form 1: T Score T Score Percentile     Figure Drawing Copy 52 72 99 Exceptionally High         Scaled Score Scaled Score Percentile  WAIS-IV Block Design: 8 8 25  Average        Mood and Personality:       Raw Score Raw Score Percentile   Beck Depression Inventory - II: 26 20 --- Moderate  PROMIS Anxiety Questionnaire: 28 21 --- Moderate        Additional Questionnaires:       Raw Score Raw Score Percentile   PROMIS Sleep Disturbance Questionnaire: 28 37 --- Moderate   Informed Consent and Coding/Compliance:   The current evaluation represents a clinical evaluation for the purposes previously outlined by the referral source and is in no way reflective of  a forensic evaluation.   Ms. Spargo was provided with a verbal description of the nature and purpose of the present neuropsychological evaluation. Also reviewed were the foreseeable risks and/or discomforts and benefits of the procedure, limits of confidentiality, and mandatory reporting requirements of this provider. The patient was given the opportunity to ask questions and receive answers about the evaluation. Oral consent to participate was provided by the patient.   This evaluation was conducted by Melville Stade, Ph.D., ABPP-CN, board certified clinical neuropsychologist. Ms. Wass completed a clinical interview with Dr. Kitty Perkins, billed as one unit [phone], and 165 minutes of cognitive testing and scoring, billed as one unit [phone] and five additional units [id number]. Psychometrist Abbott Abbot, B.S. assisted Dr. Kitty Perkins with test administration and scoring procedures. As a separate and discrete service, one unit [phone] and two units 96133 (160 minutes) were billed for Dr. Arsenio Larger time spent in interpretation and report writing.

## 2024-04-14 ENCOUNTER — Encounter: Payer: Self-pay | Admitting: Family Medicine

## 2024-04-14 ENCOUNTER — Ambulatory Visit: Payer: Self-pay

## 2024-04-14 ENCOUNTER — Ambulatory Visit: Admitting: Family

## 2024-04-14 ENCOUNTER — Ambulatory Visit (HOSPITAL_BASED_OUTPATIENT_CLINIC_OR_DEPARTMENT_OTHER)
Admission: RE | Admit: 2024-04-14 | Discharge: 2024-04-14 | Disposition: A | Discharge status: Home / Self Care | Source: Ambulatory Visit | Attending: Family | Admitting: Family

## 2024-04-14 VITALS — BP 114/76 | HR 81 | Temp 98.3°F | Resp 16 | Ht 63.0 in | Wt 154.0 lb

## 2024-04-14 DIAGNOSIS — R1900 Intra-abdominal and pelvic swelling, mass and lump, unspecified site: Secondary | ICD-10-CM | POA: Diagnosis not present

## 2024-04-14 DIAGNOSIS — M545 Low back pain, unspecified: Secondary | ICD-10-CM

## 2024-04-14 DIAGNOSIS — M47816 Spondylosis without myelopathy or radiculopathy, lumbar region: Secondary | ICD-10-CM | POA: Diagnosis not present

## 2024-04-14 DIAGNOSIS — M25552 Pain in left hip: Secondary | ICD-10-CM | POA: Diagnosis not present

## 2024-04-14 DIAGNOSIS — M25551 Pain in right hip: Secondary | ICD-10-CM | POA: Diagnosis not present

## 2024-04-14 DIAGNOSIS — R350 Frequency of micturition: Secondary | ICD-10-CM | POA: Diagnosis not present

## 2024-04-14 DIAGNOSIS — R102 Pelvic and perineal pain: Secondary | ICD-10-CM | POA: Insufficient documentation

## 2024-04-14 DIAGNOSIS — M533 Sacrococcygeal disorders, not elsewhere classified: Secondary | ICD-10-CM | POA: Diagnosis not present

## 2024-04-14 DIAGNOSIS — S3210XA Unspecified fracture of sacrum, initial encounter for closed fracture: Secondary | ICD-10-CM

## 2024-04-14 DIAGNOSIS — M1612 Unilateral primary osteoarthritis, left hip: Secondary | ICD-10-CM | POA: Diagnosis not present

## 2024-04-14 DIAGNOSIS — M5126 Other intervertebral disc displacement, lumbar region: Secondary | ICD-10-CM | POA: Diagnosis not present

## 2024-04-14 DIAGNOSIS — I700 Atherosclerosis of aorta: Secondary | ICD-10-CM | POA: Diagnosis not present

## 2024-04-14 DIAGNOSIS — M48061 Spinal stenosis, lumbar region without neurogenic claudication: Secondary | ICD-10-CM | POA: Diagnosis not present

## 2024-04-14 DIAGNOSIS — M1611 Unilateral primary osteoarthritis, right hip: Secondary | ICD-10-CM | POA: Diagnosis not present

## 2024-04-14 LAB — POC URINALSYSI DIPSTICK (AUTOMATED)
Blood, UA: NEGATIVE
Glucose, UA: NEGATIVE
Ketones, UA: NEGATIVE
Nitrite, UA: NEGATIVE
Protein, UA: NEGATIVE
Spec Grav, UA: <=1.005 — AB (ref 1.010–1.025)
Urobilinogen, UA: 0.2 U/dL
pH, UA: 6.5 (ref 5.0–8.0)

## 2024-04-14 NOTE — Telephone Encounter (Signed)
 Called dropped as patient was describing her s/s. Attempted to call back X2. No response noted. Left discrete VM.   ------------------------------------------- Copied from CRM [id number]. Topic: Clinical - Red Word Triage >> Apr 14, 2024 12:00 PM Alyse July wrote: Red Word that prompted transfer to Nurse Triage: Extreme left side back & Hip Pain

## 2024-04-14 NOTE — Progress Notes (Unsigned)
 Subjective:     Patient ID: Debra [name], female    DOB: 10-22-1956, 68 y.o.   MRN: [id number]  No chief complaint on file.   HPI  Discussed the use of AI scribe software for clinical note transcription with the patient, who gave verbal consent to proceed.  History of Present Illness       Health Maintenance Due  Topic Date Due   Lung Cancer Screening  08/27/2019   Pneumococcal Vaccine: 50+ Years (2 of 2 - PCV) 02/27/2022   COVID-19 Vaccine (6 - 2024-25 season) 06/29/2023   FOOT EXAM  02/11/2024    Past Medical History:  Diagnosis Date   Abdominal pain, right upper quadrant 04/30/2007   Allergies 02/03/2019   Anemia, iron deficiency 04/17/2017   Aortic atherosclerosis 10/10/2017   Atypical chest pain 08/31/2014   Back pain with radiculopathy 01/25/2010   Check xray --- ? Cause of leg weakness   Chronic pain of both shoulders 10/31/2020   Cognitive deficits 2022   Constipation    DDD (degenerative disc disease)    Diastolic dysfunction 02/15/2010   Epilepsy    Childhood seziures; pt reports that she grew out of them and they have not occurred since childhood   Essential hypertension 08/31/2010   Poorly controlled will alter medications, encouraged DASH diet, minimize caffeine and obtain adequate sleep. Report concerning symptoms and follow up as directed and as needed [employer] metoprolol  Edema may be c   Expressive language impairment 04/13/2024   Fatigue 01/25/2010   Generalized anxiety disorder 04/22/2018   Stable co'nt meds   GERD (gastroesophageal reflux disease) 07/16/2007   Hearing loss 07/24/2012   Refer to hearing clinic   Hiatal hernia 07/16/2007   Hyperglycemia, fasting 05/04/2010   Hyperlipidemia 04/17/2017   Encouraged heart healthy diet, increase exercise, avoid trans fats, consider a krill oil cap daily   Hypothyroidism 11/02/2010   con't meds; Lab Results Component Value TSH 1.44 08/12/2017   Insomnia 04/30/2007   Knee pain  07/16/2007   Leg pain, bilateral 11/17/2008   Low back pain 07/16/2007   Major depressive disorder 01/30/2018   con't meds F/u counselor   Mild cognitive impairment of uncertain or unknown etiology 01/30/2021   Mixed connective tissue disease    with Raynaud's   Obstructive sleep apnea 01/30/2018   F/u with pulm; May be cause of some of her memory/concentration problems   Osteoarthritis    Osteoporosis    Palpitations 02/12/2008   Pansinusitis 01/30/2018   Peripheral vertigo 04/30/2007   Resolved rto prn   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   Plantar wart 05/06/2008   Restless legs syndrome 12/01/2008   Stomach ulcer    Swallowing difficulty    Type 2 diabetes mellitus with hyperglycemia, without long-term current use of insulin  08/09/2022   Upper respiratory tract infection 12/14/2019   Urge incontinence of urine 10/06/2020   Vaginal discharge 05/25/2021   Vitamin D  deficiency    Wound infection 10/04/2021   Wound of right ankle 09/27/2021    Past Surgical History:  Procedure Laterality Date   CARPAL TUNNEL RELEASE     Bilateral   CERVICAL SPINE SURGERY     x3   LEG SURGERY     Right    WRIST FRACTURE SURGERY  10-02-15   Pt have surgery twice on the same wrist.    Family History  Problem Relation Age of Onset   Breast cancer Mother        possibly 44's  Arthritis Mother        rheumatoid   Cancer Mother        breast   Heart disease Mother 7       MI   Hyperlipidemia Mother    Thyroid  disease Mother    Depression Mother    Anxiety disorder Mother    Heart disease Father        MI   Hypertension Father    Stroke Father    Diabetes Sister    Hypertension Sister    Hyperlipidemia Sister    Memory loss Maternal Aunt    Colon cancer Maternal Grandfather    Esophageal cancer Neg Hx    Rectal cancer Neg Hx    Stomach cancer Neg Hx     Social History   Socioeconomic History   Marital status: Legally Separated    Spouse name: Not on file   Number of  children: 3   Years of education: 14   Highest education level: Some college, no degree  Occupational History   Occupation: Security  Tobacco Use   Smoking status: Every Day    Current packs/day: 0.75    Average packs/day: 0.8 packs/day for 44.0 years (33.0 ttl pk-yrs)    Types: Cigarettes   Smokeless tobacco: Never  Substance and Sexual Activity   Alcohol use: Yes    Comment: 2-3 drinks per week (beer/wine)   Drug use: No   Sexual activity: Yes    Partners: Male    Birth control/protection: Post-menopausal  Other Topics Concern   Not on file  Social History Narrative   Lives alone   Currently working   Drinks caffeine   Two floor home   ambidextrous   Social Drivers of Health   Financial Resource Strain: Low Risk  (01/06/2024)   Overall Financial Resource Strain (CARDIA)    Difficulty of Paying Living Expenses: Not very hard  Food Insecurity: No Food Insecurity (01/06/2024)   Hunger Vital Sign    Worried About Running Out of Food in the Last Year: Never true    Ran Out of Food in the Last Year: Never true  Transportation Needs: No Transportation Needs (01/06/2024)   PRAPARE - Administrator, Civil Service (Medical): No    Lack of Transportation (Non-Medical): No  Physical Activity: Inactive (01/06/2024)   Exercise Vital Sign    Days of Exercise per Week: 0 days    Minutes of Exercise per Session: 0 min  Stress: Stress Concern Present (01/06/2024)   [employer] of Occupational Health - Occupational Stress Questionnaire    Feeling of Stress : Very much  Social Connections: Moderately Isolated (01/06/2024)   Social Connection and Isolation Panel    Frequency of Communication with Friends and Family: More than three times a week    Frequency of Social Gatherings with Friends and Family: Once a week    Attends Religious Services: More than 4 times per year    Active Member of [employer] or Organizations: No    Attends Banker Meetings: Never     Marital Status: Separated  Intimate Partner Violence: Not At Risk (01/06/2024)   Humiliation, Afraid, Rape, and Kick questionnaire    Fear of Current or Ex-Partner: No    Emotionally Abused: No    Physically Abused: No    Sexually Abused: No    Outpatient Medications Prior to Visit  Medication Sig Dispense Refill   Accu-Chek Softclix Lancets lancets USE TO CHECK BLOOD GLUCOSE ONCE A  DAY 100 each 12   ammonium lactate  (LAC-HYDRIN ) 12 % lotion Apply 1 Application topically as needed for dry skin. 400 g 0   azelastine  (ASTELIN ) 0.1 % nasal spray Place 2 sprays into both nostrils 2 (two) times daily. Use in each nostril as directed 30 mL 5   Blood Glucose Monitoring Suppl DEVI 1 each by Does not apply route in the morning, at noon, and at bedtime. May substitute to any manufacturer covered by patient's insurance. 1 each 0   donepezil  (ARICEPT ) 5 MG tablet Take 1 tablet (5 mg total) by mouth daily. 30 tablet 11   fluticasone  (FLONASE ) 50 MCG/ACT nasal spray Place 2 sprays into both nostrils daily. 16 g 6   gabapentin  (NEURONTIN ) 600 MG tablet TAKE 1 TABLET BY MOUTH 3 TIMES  DAILY 300 tablet 2   hydrochlorothiazide  (HYDRODIURIL ) 25 MG tablet TAKE 1 TABLET BY MOUTH DAILY 100 tablet 2   HYDROcodone -acetaminophen  (NORCO/VICODIN) 5-325 MG tablet      levothyroxine  (SYNTHROID ) 50 MCG tablet Take 1 tablet (50 mcg total) by mouth daily. 100 tablet 2   losartan  (COZAAR ) 100 MG tablet TAKE 1 TABLET BY MOUTH DAILY 100 tablet 2   metoprolol  succinate (TOPROL -XL) 100 MG 24 hr tablet TAKE 1 TABLET BY MOUTH DAILY  WITH OR IMMEDIATELY FOLLOWING A  MEAL 90 tablet 3   montelukast  (SINGULAIR ) 10 MG tablet Take 1 tablet (10 mg total) by mouth at bedtime. 90 tablet 0   oxybutynin  (DITROPAN -XL) 10 MG 24 hr tablet Take 1 tablet (10 mg total) by mouth at bedtime. 90 tablet 3   PARoxetine  (PAXIL ) 40 MG tablet Take 1 tablet (40 mg total) by mouth every morning. 90 tablet 3   pravastatin  (PRAVACHOL ) 40 MG tablet Take 1  tablet (40 mg total) by mouth daily. 90 tablet 1   predniSONE  (DELTASONE ) 10 MG tablet      saccharomyces boulardii (FLORASTOR) 250 MG capsule Take 1 capsule (250 mg total) by mouth 2 (two) times daily. 60 capsule 5   Semaglutide , 1 MG/DOSE, (OZEMPIC , 1 MG/DOSE,) 4 MG/3ML SOPN Inject 1 mg into the skin once a week. 3 mL 2   traZODone  (DESYREL ) 50 MG tablet Take 0.5-1 tablets (25-50 mg total) by mouth at bedtime as needed for sleep. 30 tablet 3   No facility-administered medications prior to visit.    Allergies  Allergen Reactions   Penicillins    Codeine Rash and Other (See Comments)    dizziness    ROS     Objective:    Physical Exam Constitutional:      General: She is not in acute distress.    Appearance: Normal appearance. She is well-developed.  HENT:     Head: Normocephalic and atraumatic.     Right Ear: External ear normal.     Left Ear: External ear normal.   Eyes:     General: No scleral icterus.  Neck:     Thyroid : No thyromegaly.   Cardiovascular:     Rate and Rhythm: Normal rate and regular rhythm.     Heart sounds: Normal heart sounds. No murmur heard. Pulmonary:     Effort: Pulmonary effort is normal. No respiratory distress.     Breath sounds: Normal breath sounds. No wheezing.   Musculoskeletal:     Cervical back: Normal and neck supple. No swelling or tenderness.     Thoracic back: Normal. No tenderness.     Lumbar back: Normal. No swelling or tenderness.     Comments: Significant  lumbar lordosis.   Skin:    General: Skin is warm and dry.   Neurological:     Mental Status: She is alert and oriented to person, place, and time.   Psychiatric:        Mood and Affect: Mood normal.        Behavior: Behavior normal.        Thought Content: Thought content normal.        Judgment: Judgment normal.      There were no vitals taken for this visit. Wt Readings from Last 3 Encounters:  04/08/24 157 lb 2 oz (71.3 kg)  01/13/24 158 lb 6.4 oz (71.8  kg)  01/06/24 158 lb (71.7 kg)       Assessment & Plan:   Problem List Items Addressed This Visit   None   I am having Debra [name] maintain her Accu-Chek Softclix Lancets, PARoxetine , azelastine , fluticasone , montelukast , levothyroxine , gabapentin , saccharomyces boulardii, ammonium lactate , pravastatin , hydrochlorothiazide , losartan , metoprolol  succinate, Blood Glucose Monitoring Suppl, donepezil , traZODone , oxybutynin , Ozempic  (1 MG/DOSE), HYDROcodone -acetaminophen , and predniSONE .  No orders of the defined types were placed in this encounter.

## 2024-04-14 NOTE — Telephone Encounter (Signed)
 FYI Only or Action Required?: FYI only for provider  Patient was last seen in primary care on 01/13/2024 by Crecencio Dodge, Candida Chalk, DO. Called Nurse Triage reporting Flank Pain. Symptoms began several days ago. Interventions attempted: Nothing. Symptoms are: gradually worsening.  Triage Disposition: See Physician Within 24 Hours  Patient/caregiver understands and will follow disposition?: Yes, will follow disposition  Reason for Disposition  MODERATE pain (e.g., interferes with normal activities or awakens from sleep)  Answer Assessment - Initial Assessment Questions 1. LOCATION: Where does it hurt? (e.g., left, right)     L side 2. ONSET: When did the pain start?     About 5 days 3. SEVERITY: How bad is the pain? (e.g., Scale 1-10; mild, moderate, or severe)   - MILD (1-3): doesn't interfere with normal activities    - MODERATE (4-7): interferes with normal activities or awakens from sleep    - SEVERE (8-10): excruciating pain and patient unable to do normal activities (stays in bed)      Moderate-severe 4. PATTERN: Does the pain come and go, or is it constant?      constant 5. CAUSE: What do you think is causing the pain?     sciatica 6. OTHER SYMPTOMS:  Do you have any other symptoms? (e.g., fever, abdomen pain, vomiting, leg weakness, burning with urination, blood in urine)     Increased urination  Protocols used: Flank Pain-A-AH

## 2024-04-15 ENCOUNTER — Telehealth: Payer: Self-pay | Admitting: Family

## 2024-04-15 ENCOUNTER — Ambulatory Visit (INDEPENDENT_AMBULATORY_CARE_PROVIDER_SITE_OTHER): Payer: 59 | Admitting: Psychology

## 2024-04-15 DIAGNOSIS — F801 Expressive language disorder: Secondary | ICD-10-CM | POA: Diagnosis not present

## 2024-04-15 DIAGNOSIS — M533 Sacrococcygeal disorders, not elsewhere classified: Secondary | ICD-10-CM | POA: Insufficient documentation

## 2024-04-15 DIAGNOSIS — G3184 Mild cognitive impairment, so stated: Secondary | ICD-10-CM

## 2024-04-15 DIAGNOSIS — R1900 Intra-abdominal and pelvic swelling, mass and lump, unspecified site: Secondary | ICD-10-CM | POA: Insufficient documentation

## 2024-04-15 LAB — URINE CULTURE
MICRO NUMBER:: 16596086
Result:: NO GROWTH
SPECIMEN QUALITY:: ADEQUATE

## 2024-04-15 NOTE — Progress Notes (Signed)
   Neuropsychology Feedback Session [facility] Department of Neurology  Reason for Referral:   Debra [name] is a 68 y.o. ambidextrous African-American female referred by Tex Filbert, PA-C, to characterize her current cognitive functioning and assist with diagnostic clarity and treatment planning in the context of subjective cognitive decline.   Feedback:   Debra [name] completed a comprehensive neuropsychological evaluation on 04/13/2024. Please refer to that encounter for the full report and recommendations. Briefly, results suggested primary deficits surrounding expressive language (confrontation naming and semantic fluency in particular) and executive functioning. Weaknesses were also exhibited across processing speed and encoding/retrieval aspects of verbal memory. Relative to her previous evaluation in April 2022, decline was exhibited across executive functioning, particularly cognitive flexibility and abstract reasoning. Performances across other assessed domains exhibited relative stability over time. The etiology for ongoing dysfunction remains uncertain. There remains the potential for chronic medical factors, moderate microvascular ischemic disease as seen on her most recent brain MRI, untreated sleep apnea, chronic pain, and fairly prominent psychiatric distress to be playing an active role in her testing profile and clinical presentation. Medication side effects, especially caused by paroxetine /Paxil , metoprolol  succinate/Toprol , prednisone , and hydrocodone , may also be playing an active role. 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 Relative stability over the past 2-3 years is encouraging and should limit these neurological concerns at the present time. However, continued monitoring of her outward language abilities will be  extremely important moving forward.    Debra [name] was unaccompanied during the current feedback session. Content of the current session focused on the results of her neuropsychological evaluation. Debra [name] was given the opportunity to ask questions and her questions were answered. She was encouraged to reach out should additional questions arise. A copy of her report was provided at the conclusion of the visit.      One unit 96132 (31 minutes) was billed for Dr. Arsenio Larger time spent preparing for, conducting, and documenting the current feedback session with Debra [name].

## 2024-04-15 NOTE — Assessment & Plan Note (Signed)
 Incidental finding on XR.  Obtain US  for further evaluation. ? Fibroid.

## 2024-04-15 NOTE — Patient Instructions (Signed)
 VISIT SUMMARY:  You visited us  today due to severe tailbone pain following a fall, along with chronic urinary frequency and episodes of heartburn.  YOUR PLAN:  COCCYX PAIN: You have severe tailbone pain after a fall, with new pain in your lower back and left hip. This could be due to a fracture or muscle strain. -We have ordered urgent x-rays of your tailbone, lower back, pelvis, and left hip. -If any fractures are found, we will refer you to a specialist.  URINARY FREQUENCY: You have chronic urinary frequency that has recently worsened. Your urinalysis showed a few white blood cells but no clear infection. -We have sent your urine for a culture to check for any infections.  FOLLOW-UP: We will follow up with you regarding your x-ray results. -We will contact you with the x-ray results by tomorrow morning.

## 2024-04-15 NOTE — Telephone Encounter (Signed)
 Please advise pt that her x-ray confirms tailbone fracture.  I will refer her to orthopedics for further evaluation. She can continue using donut pillow for comfort.    Also, they saw a mass in her pelvis on the x-ray which is most likely a fibroid in her uterus. I would like for her to complete an ultrasound to further evaluate.

## 2024-04-15 NOTE — Assessment & Plan Note (Signed)
 X-ray confirms sacral fracture.  Will refer to orthopedics.

## 2024-04-15 NOTE — Telephone Encounter (Signed)
 Patient notified of results, provider's comments and recommendations. She was told she will get a call from radiology and ortho for appointments

## 2024-04-16 ENCOUNTER — Ambulatory Visit: Payer: Self-pay | Admitting: Family

## 2024-04-16 ENCOUNTER — Other Ambulatory Visit: Payer: Self-pay | Admitting: Family

## 2024-04-16 DIAGNOSIS — R1900 Intra-abdominal and pelvic swelling, mass and lump, unspecified site: Secondary | ICD-10-CM

## 2024-04-18 ENCOUNTER — Ambulatory Visit (HOSPITAL_BASED_OUTPATIENT_CLINIC_OR_DEPARTMENT_OTHER)
Admission: RE | Admit: 2024-04-18 | Discharge: 2024-04-18 | Disposition: A | Discharge status: Home / Self Care | Source: Ambulatory Visit | Attending: Family | Admitting: Family

## 2024-04-18 DIAGNOSIS — R9389 Abnormal findings on diagnostic imaging of other specified body structures: Secondary | ICD-10-CM | POA: Diagnosis not present

## 2024-04-18 DIAGNOSIS — R1900 Intra-abdominal and pelvic swelling, mass and lump, unspecified site: Secondary | ICD-10-CM | POA: Diagnosis not present

## 2024-04-19 ENCOUNTER — Telehealth (HOSPITAL_BASED_OUTPATIENT_CLINIC_OR_DEPARTMENT_OTHER): Admitting: Family

## 2024-04-19 ENCOUNTER — Other Ambulatory Visit: Payer: Self-pay | Admitting: Family Medicine

## 2024-04-19 DIAGNOSIS — F411 Generalized anxiety disorder: Secondary | ICD-10-CM

## 2024-04-19 DIAGNOSIS — G4700 Insomnia, unspecified: Secondary | ICD-10-CM

## 2024-04-19 DIAGNOSIS — F331 Major depressive disorder, recurrent, moderate: Secondary | ICD-10-CM

## 2024-04-19 DIAGNOSIS — F418 Other specified anxiety disorders: Secondary | ICD-10-CM
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
## 2024-04-23 ENCOUNTER — Telehealth: Payer: Self-pay

## 2024-04-23 NOTE — Telephone Encounter (Signed)
 Copied from CRM [id number]. Topic: Clinical - Lab/Test Results >> Apr 23, 2024 12:52 PM Mercedes MATSU wrote: Reason for CRM: Patient requesting call back in regards to her ultrasound results. Patient can be reached at [phone].

## 2024-04-23 NOTE — Telephone Encounter (Signed)
 Waiting for results.

## 2024-04-27 ENCOUNTER — Ambulatory Visit: Payer: Self-pay | Admitting: Family

## 2024-04-27 ENCOUNTER — Ambulatory Visit (INDEPENDENT_AMBULATORY_CARE_PROVIDER_SITE_OTHER): Admitting: Physician Assistant

## 2024-04-27 ENCOUNTER — Encounter: Payer: Self-pay | Admitting: Physician Assistant

## 2024-04-27 VITALS — Resp 20 | Ht 63.0 in | Wt 132.0 lb

## 2024-04-27 DIAGNOSIS — G3184 Mild cognitive impairment, so stated: Secondary | ICD-10-CM

## 2024-04-27 DIAGNOSIS — R479 Unspecified speech disturbances: Secondary | ICD-10-CM

## 2024-04-27 NOTE — Patient Instructions (Addendum)
 It was a pleasure to see you today at our office.    PET scan brain Referral to speech therapy  Discontinue smoking  Resume CPAP for sleep apnea Continue the management of iron deficiency anemia Control the BP, cholesterol and sugar and thyroid  resume donepezil   5 mg daily by your primary doctor Recommend checking hearing Recommend psychotherapy for stress  Follow up in 6 months  Monitor driving      RECOMMENDATIONS FOR ALL PATIENTS WITH MEMORY PROBLEMS: 1. Continue to exercise (Recommend 30 minutes of walking everyday, or 3 hours every week) 2. Increase social interactions - continue going to [place] and enjoy social gatherings with friends and family 3. Eat healthy, avoid fried foods and eat more fruits and vegetables 4. Maintain adequate blood pressure, blood sugar, and blood cholesterol level. Reducing the risk of stroke and cardiovascular disease also helps promoting better memory. 5. Avoid stressful situations. Live a simple life and avoid aggravations. Organize your time and prepare for the next day in anticipation. 6. Sleep well, avoid any interruptions of sleep and avoid any distractions in the bedroom that may interfere with adequate sleep quality 7. Avoid sugar, avoid sweets as there is a strong link between excessive sugar intake, diabetes, and cognitive impairment We discussed the Mediterranean diet, which has been shown to help patients reduce the risk of progressive memory disorders and reduces cardiovascular risk. This includes eating fish, eat fruits and green leafy vegetables, nuts like almonds and hazelnuts, walnuts, and also use olive oil. Avoid fast foods and fried foods as much as possible. Avoid sweets and sugar as sugar use has been linked to worsening of memory function.  There is always a concern of gradual progression of memory problems. If this is the case, then we may need to adjust level of care according to patient needs. Support, both to the patient and  caregiver, should then be put into place.       FALL PRECAUTIONS: Be cautious when walking. Scan the area for obstacles that may increase the risk of trips and falls. When getting up in the mornings, sit up at the edge of the bed for a few minutes before getting out of bed. Consider elevating the bed at the head end to avoid drop of blood pressure when getting up. Walk always in a well-lit room (use night lights in the walls). Avoid area rugs or power cords from appliances in the middle of the walkways. Use a walker or a cane if necessary and consider physical therapy for balance exercise. Get your eyesight checked regularly.  FINANCIAL OVERSIGHT: Supervision, especially oversight when making financial decisions or transactions is also recommended.  HOME SAFETY: Consider the safety of the kitchen when operating appliances like stoves, microwave oven, and blender. Consider having supervision and share cooking responsibilities until no longer able to participate in those. Accidents with firearms and other hazards in the house should be identified and addressed as well.   ABILITY TO BE LEFT ALONE: If patient is unable to contact 911 operator, consider using LifeLine, or when the need is there, arrange for someone to stay with patients. Smoking is a fire hazard, consider supervision or cessation. Risk of wandering should be assessed by caregiver and if detected at any point, supervision and safe proof recommendations should be instituted.  MEDICATION SUPERVISION: Inability to self-administer medication needs to be constantly addressed. Implement a mechanism to ensure safe administration of the medications.   DRIVING: Regarding driving, in patients with progressive memory problems, driving will  be impaired. We advise to have someone else do the driving if trouble finding directions or if minor accidents are reported. Independent driving assessment is available to determine safety of driving.   If you  are interested in the driving assessment, you can contact the following:  [facility] [employer] in [place] [phone]  [facility] [phone]  [facility] [phone] or [phone]    Mediterranean Diet A Mediterranean diet refers to food and lifestyle choices that are based on the traditions of countries located on the [employer]. This way of eating has been shown to help prevent certain conditions and improve outcomes for people who have chronic diseases, like kidney disease and heart disease. What are tips for following this plan? Lifestyle  Cook and eat meals together with your family, when possible. Drink enough fluid to keep your urine clear or pale yellow. Be physically active every day. This includes: Aerobic exercise like running or swimming. Leisure activities like gardening, walking, or housework. Get 7-8 hours of sleep each night. If recommended by your health care provider, drink red wine in moderation. This means 1 glass a day for nonpregnant women and 2 glasses a day for men. A glass of wine equals 5 oz (150 mL). Reading food labels  Check the serving size of packaged foods. For foods such as rice and pasta, the serving size refers to the amount of cooked product, not dry. Check the total fat in packaged foods. Avoid foods that have saturated fat or trans fats. Check the ingredients list for added sugars, such as corn syrup. Shopping  At the grocery store, buy most of your food from the areas near the walls of the store. This includes: Fresh fruits and vegetables (produce). Grains, beans, nuts, and seeds. Some of these may be available in unpackaged forms or large amounts (in bulk). Fresh seafood. Poultry and eggs. Low-fat dairy products. Buy whole ingredients instead of prepackaged foods. Buy fresh fruits and vegetables in-season from local farmers markets. Buy frozen fruits and  vegetables in resealable bags. If you do not have access to quality fresh seafood, buy precooked frozen shrimp or canned fish, such as tuna, salmon, or sardines. Buy small amounts of raw or cooked vegetables, salads, or olives from the deli or salad bar at your store. Stock your pantry so you always have certain foods on hand, such as olive oil, canned tuna, canned tomatoes, rice, pasta, and beans. Cooking  Cook foods with extra-virgin olive oil instead of using butter or other vegetable oils. Have meat as a side dish, and have vegetables or grains as your main dish. This means having meat in small portions or adding small amounts of meat to foods like pasta or stew. Use beans or vegetables instead of meat in common dishes like chili or lasagna. Experiment with different cooking methods. Try roasting or broiling vegetables instead of steaming or sauteing them. Add frozen vegetables to soups, stews, pasta, or rice. Add nuts or seeds for added healthy fat at each meal. You can add these to yogurt, salads, or vegetable dishes. Marinate fish or vegetables using olive oil, lemon juice, garlic, and fresh herbs. Meal planning  Plan to eat 1 vegetarian meal one day each week. Try to work up to 2 vegetarian meals, if possible. Eat seafood 2 or more times a week. Have healthy snacks readily available, such as: Vegetable sticks with hummus. Greek yogurt. Fruit and nut trail mix. Eat balanced meals throughout the week.  This includes: Fruit: 2-3 servings a day Vegetables: 4-5 servings a day Low-fat dairy: 2 servings a day Fish, poultry, or lean meat: 1 serving a day Beans and legumes: 2 or more servings a week Nuts and seeds: 1-2 servings a day Whole grains: 6-8 servings a day Extra-virgin olive oil: 3-4 servings a day Limit red meat and sweets to only a few servings a month What are my food choices? Mediterranean diet Recommended Grains: Whole-grain pasta. Brown rice. Bulgar wheat. Polenta.  Couscous. Whole-wheat bread. [place] Madeira. Vegetables: Artichokes. Beets. Broccoli. Cabbage. Carrots. Eggplant. Green beans. Chard. Kale. Spinach. Onions. Leeks. Peas. Squash. Tomatoes. Peppers. Radishes. Fruits: Apples. Apricots. Avocado. Berries. Bananas. Cherries. Dates. Figs. Grapes. Lemons. Melon. Oranges. Peaches. Plums. Pomegranate. Meats and other protein foods: Beans. Almonds. Sunflower seeds. Pine nuts. Peanuts. Cod. Salmon. Scallops. Shrimp. Tuna. Tilapia. Clams. Oysters. Eggs. Dairy: Low-fat milk. Cheese. Greek yogurt. Beverages: Water. Red wine. Herbal tea. Fats and oils: Extra virgin olive oil. Avocado oil. Grape seed oil. Sweets and desserts: [place] yogurt with honey. Baked apples. Poached pears. Trail mix. Seasoning and other foods: Basil. Cilantro. Coriander. Cumin. Mint. Parsley. Sage. Rosemary. Tarragon. Garlic. Oregano. Thyme. Pepper. Balsalmic vinegar. Tahini. Hummus. Tomato sauce. Olives. Mushrooms. Limit these Grains: Prepackaged pasta or rice dishes. Prepackaged cereal with added sugar. Vegetables: Deep fried potatoes (french fries). Fruits: Fruit canned in syrup. Meats and other protein foods: Beef. Pork. Lamb. Poultry with skin. Hot dogs. [place]. Dairy: Ice cream. Sour cream. Whole milk. Beverages: Juice. Sugar-sweetened soft drinks. Beer. Liquor and spirits. Fats and oils: Butter. Canola oil. Vegetable oil. Beef fat (tallow). Lard. Sweets and desserts: Cookies. Cakes. Pies. Candy. Seasoning and other foods: Mayonnaise. Premade sauces and marinades. The items listed may not be a complete list. Talk with your dietitian about what dietary choices are right for you. Summary The Mediterranean diet includes both food and lifestyle choices. Eat a variety of fresh fruits and vegetables, beans, nuts, seeds, and whole grains. Limit the amount of red meat and sweets that you eat. Talk with your health care provider about whether it is safe for you to drink red wine in  moderation. This means 1 glass a day for nonpregnant women and 2 glasses a day for men. A glass of wine equals 5 oz (150 mL). This information is not intended to replace advice given to you by your health care provider. Make sure you discuss any questions you have with your health care provider. Document Released: 06/06/2016 Document Revised: 07/09/2016 Document Reviewed: 06/06/2016 Elsevier Interactive Patient Education  2017 [employer].

## 2024-04-27 NOTE — Progress Notes (Signed)
 Assessment/Plan:    Mild cognitive impairment likely of multiple etiologies  Debra [name] is a very pleasant 68 y.o. RH female with a history of iron deficiency anemia, chronic back pain, GAD, MDD, hearing loss, hypertension, hyperlipidemia, RLS, GERD, DM2, vitamin D  deficiency, hypothyroidism and a diagnosis of mild cognitive impairment likely of multiple etiologies expressive language impairment presenting today in follow-up for evaluation of memory loss. Patient was on donepezil  5 mg daily, lost her prescription, instructed to refill it. She lives alone, misses bills and medicines,  getting lost when driving. Discussed with her moving to assisted living for better surveillance. She is not interested in this change at this time.     Recommendations:   Follow up in 6 months. FDG PET scan to rule out neurodegenerative illness such as PPA or atypical Alzheimer's disease presentation  Recommend resuming  donepezil  5 mg daily, side effects discussed Repeat neuropsych evaluation 18 to 24 months for diagnostic clarity and disease trajectory Recommend speech therapy for speech difficulties  Recommend using CPAP for OSA Continue management of iron deficiency anemia Highly recommend resuming counseling for MDD and GAD Continue PT for chronic pain Recommend checking with audiologist for decreased hearing Recommend good control of cardiovascular risk factors Recommend psychotherapy to address symptoms of psychiatric distress Continue to control mood medications as per PCP Tobacco cessation counseled Monitor driving    Subjective:   This patient is here alone. Previous records as well as any outside records available were reviewed prior to todays visit.   Patient was last seen on 12/05/2023 with MMSE 29/30 .    Any changes in memory since last visit? May be worse.  She has some difficulty remembering recent conversations and names, but not different from prior. She has not been taking  the medication , at least 4 months. repeats oneself?  Denies, but she has to ask people to repeat a question. Disoriented when walking into a room?  Patient denies   Misplacing objects?  Patient denies   Wandering behavior?   Denies. Any personality changes since last visit?  As before, she has been going through significant amount of stress.  She is yet to seek counseling I misplaced the numbers. Any worsening depression?:  She has a history of depression and anxiety, has not been following with counseling.  Hallucinations or paranoia? Sometimes I think I see a child but it is not, may be a spirit Seizures?   Denies.    Any sleep changes?  Does not sleep very well has a history of insomnia.  Takes trazodone  with some relief. She has vivid dreams but denies dream reenactment.  Denies sleepwalking.    Sleep apnea?   denies   Any hygiene concerns?   Denies.   Independent of bathing and dressing?  Endorsed  Does the patient needs help with medications? Patient is in charge, admits to missed doses  Who is in charge of the finances?  Patient is in charge, missing bills      Any changes in appetite?  denies.  Admits not drinking enough water    Patient have trouble swallowing?  Denies.   Does the patient cook?  Yes, she does forget her own recipes.   Any kitchen accidents such as leaving the stove on?   Denies.   Any headaches?    Denies.   Vision changes? Denies. Chronic pain?  Denies.   Ambulates with difficulty?  She admits to being deconditioned, trying to exercise, she walks in the park  sometimes   Recent falls or head injuries?  Fell on the tailbone 4 weeks ago so it hurts a little, waiting to heal.  Unilateral weakness, numbness or tingling?  Denies.   Any tremors?  Denies.   Any anosmia?    Denies.   Any incontinence of urine?  Urge incontinence, wears pads Any bowel dysfunction?  Occasional constipation  patient lives alone.   Does the patient drive?  Endorsed, sometimes he needs  GPS.She got lost 2 weeks ago  Tobacco?  About 1 pack a day   MRI of the brain 02/2023, personally reviewed, without disproportionate lobar atrophy, but with moderate small vessel ischemic disease but without acute findings.   Initial visit 03/13/2023 How long did patient have memory difficulties?  For several years, worse over the last year .Patient has some difficulty remembering recent conversations and people names. It is worse after Covid 2 years ago.  I was tested in the past for ADD, may have a slight but not a lot repeats oneself?  Denies repeating.  It' s me the one that is asking them to repeat .  Disoriented when walking into a room?  Patient denies except occasionally not remembering what patient came to the room for.    Leaving objects in unusual places?  I tried to get rid of stuff.  I have been trying to declog my house . Wandering behavior?  denies   Any personality changes since last visit?  Patient denies   Any history of depression?:  Endorsed, she has a history of depression and anxiety, followed a counselor but have not seen them in a while . Hallucinations or paranoia?  P I have seen something run across but turn my head and there is no one, not often .   Seizures?   I had epilepsy as a child   Any sleep changes?  She has insomnia . Does not sleep well, wakes up during the night, yesterday she slept 3 hrs. Takes Requip that helps.  Sometimes she has vivid dreams, denies  REM behavior or sleepwalking   Sleep apnea? Endorsed, years ago I got one, but I stopped using it for years the CPAP is sitting out there.    Any hygiene concerns?  Patient denies   Independent of bathing and dressing?  Endorsed  Does the patient needs help with medications?  Patient is in charge, misses doses   Who is in charge of the finances?  Patient is in charge, has missed bills in the past      Any changes in appetite?  I am on something for my DM, Ozempic , to lose weight . Not  drinking enough water  Patient have trouble swallowing? denies   Does the patient cook?  Yes. Forgets common recipes  Any kitchen accidents such as leaving the stove on? denies   Any headaches?   denies   Chronic back pain ? Endorsed, takes pain meds did physical therapy for a while , It was rough Ambulates with difficulty?  Chronic, Sometimes I lose the strength in my legs.  Recent falls or head injuries? Endorsed, sometimes my legs are weaker. I am to attend PT for strength Vision changes?  She had recent cataract surgery it did not help much  Unilateral weakness, numbness or tingling? denies   Any tremors?   denies   Any anosmia?  denies   Any incontinence of urine?  She has a history of urge incontinence. Wears pads  Any bowel dysfunction?  Some  constipation    Patient lives alone  History of heavy alcohol intake?  Denies  history of heavy tobacco use?  Trying to quit, uses nicotine  patch. Was doing 1/2 ppd but stopped 4 weeks ago  Family history of dementia? Aunt has dementia unknown type Does patient drive? Endorsed, sometimes I have to use my GPS  Neuropsych evaluation 04/13/2024 , Dr. Richie briefly, results suggested primary deficits surrounding expressive language (confrontation naming and semantic fluency in particular) and executive functioning. Weaknesses were also exhibited across processing speed and encoding/retrieval aspects of verbal memory. Relative to her previous evaluation in April 2022, decline was exhibited across executive functioning, particularly cognitive flexibility and abstract reasoning. Performances across other assessed domains exhibited relative stability over time. The etiology for ongoing dysfunction remains uncertain. There remains the potential for chronic medical factors, moderate microvascular ischemic disease as seen on her most recent brain MRI, untreated sleep apnea, chronic pain, and fairly prominent psychiatric distress to be playing an active role  in her testing profile and clinical presentation. Medication side effects, especially caused by paroxetine /Paxil , metoprolol  succinate/Toprol , prednisone , and hydrocodone , may also be playing an active role. 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 Relative stability over the past 2-3 years is encouraging and should limit these neurological concerns at the present time. However, continued monitoring of her outward language abilities will be extremely important moving forward.     Past Medical History:  Diagnosis Date   Abdominal pain, right upper quadrant 04/30/2007   Allergies 02/03/2019   Anemia, iron deficiency 04/17/2017   Aortic atherosclerosis 10/10/2017   Atypical chest pain 08/31/2014   Back pain with radiculopathy 01/25/2010   Check xray --- ? Cause of leg weakness   Chronic pain of both shoulders 10/31/2020   Cognitive deficits 2022   Constipation    DDD (degenerative disc disease)    Diastolic dysfunction 02/15/2010   Epilepsy    Childhood seziures; pt reports that she grew out of them and they have not occurred since childhood   Essential hypertension 08/31/2010   Poorly controlled will alter medications, encouraged DASH diet, minimize caffeine and obtain adequate sleep. Report concerning symptoms and follow up as directed and as needed [employer] metoprolol  Edema may be c   Expressive language impairment 04/13/2024   Fatigue 01/25/2010   Generalized anxiety disorder 04/22/2018   Stable co'nt meds   GERD (gastroesophageal reflux disease) 07/16/2007   Hearing loss 07/24/2012   Refer to hearing clinic   Hiatal hernia 07/16/2007   Hyperglycemia, fasting 05/04/2010   Hyperlipidemia 04/17/2017   Encouraged heart healthy diet, increase exercise, avoid trans fats, consider a krill oil cap daily   Hypothyroidism 11/02/2010   con't meds; Lab  Results Component Value TSH 1.44 08/12/2017   Insomnia 04/30/2007   Knee pain 07/16/2007   Leg pain, bilateral 11/17/2008   Low back pain 07/16/2007   Major depressive disorder 01/30/2018   con't meds F/u counselor   Mild cognitive impairment of uncertain or unknown etiology 01/30/2021   Mixed connective tissue disease    with Raynaud's   Obstructive sleep apnea 01/30/2018   F/u with pulm; May be cause of some of her memory/concentration problems   Osteoarthritis    Osteoporosis    Palpitations 02/12/2008   Pansinusitis 01/30/2018   Peripheral vertigo 04/30/2007   Resolved rto prn   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   Plantar wart 05/06/2008   Restless legs syndrome 12/01/2008  Stomach ulcer    Swallowing difficulty    Type 2 diabetes mellitus with hyperglycemia, without long-term current use of insulin  08/09/2022   Upper respiratory tract infection 12/14/2019   Urge incontinence of urine 10/06/2020   Vaginal discharge 05/25/2021   Vitamin D  deficiency    Wound infection 10/04/2021   Wound of right ankle 09/27/2021     Past Surgical History:  Procedure Laterality Date   CARPAL TUNNEL RELEASE     Bilateral   CERVICAL SPINE SURGERY     x3   LEG SURGERY     Right    WRIST FRACTURE SURGERY  10-02-15   Pt have surgery twice on the same wrist.     PREVIOUS MEDICATIONS:   CURRENT MEDICATIONS:  Outpatient Encounter Medications as of 04/27/2024  Medication Sig   Accu-Chek Softclix Lancets lancets USE TO CHECK BLOOD GLUCOSE ONCE A DAY   ammonium lactate  (LAC-HYDRIN ) 12 % lotion Apply 1 Application topically as needed for dry skin.   azelastine  (ASTELIN ) 0.1 % nasal spray Place 2 sprays into both nostrils 2 (two) times daily. Use in each nostril as directed   Blood Glucose Monitoring Suppl DEVI 1 each by Does not apply route in the morning, at noon, and at bedtime. May substitute to any manufacturer covered by patient's insurance.   donepezil  (ARICEPT ) 5 MG tablet Take 1 tablet  (5 mg total) by mouth daily.   fluticasone  (FLONASE ) 50 MCG/ACT nasal spray Place 2 sprays into both nostrils daily.   gabapentin  (NEURONTIN ) 600 MG tablet TAKE 1 TABLET BY MOUTH 3 TIMES  DAILY   hydrochlorothiazide  (HYDRODIURIL ) 25 MG tablet TAKE 1 TABLET BY MOUTH DAILY   HYDROcodone -acetaminophen  (NORCO/VICODIN) 5-325 MG tablet    levothyroxine  (SYNTHROID ) 50 MCG tablet Take 1 tablet (50 mcg total) by mouth daily.   losartan  (COZAAR ) 100 MG tablet TAKE 1 TABLET BY MOUTH DAILY   metoprolol  succinate (TOPROL -XL) 100 MG 24 hr tablet TAKE 1 TABLET BY MOUTH DAILY  WITH OR IMMEDIATELY FOLLOWING A  MEAL   montelukast  (SINGULAIR ) 10 MG tablet Take 1 tablet (10 mg total) by mouth at bedtime.   oxybutynin  (DITROPAN -XL) 10 MG 24 hr tablet Take 1 tablet (10 mg total) by mouth at bedtime.   PARoxetine  (PAXIL ) 40 MG tablet Take 1 tablet (40 mg total) by mouth every morning.   pravastatin  (PRAVACHOL ) 40 MG tablet Take 1 tablet (40 mg total) by mouth daily.   predniSONE  (DELTASONE ) 10 MG tablet    saccharomyces boulardii (FLORASTOR) 250 MG capsule Take 1 capsule (250 mg total) by mouth 2 (two) times daily.   Semaglutide , 1 MG/DOSE, (OZEMPIC , 1 MG/DOSE,) 4 MG/3ML SOPN Inject 1 mg into the skin once a week.   traZODone  (DESYREL ) 50 MG tablet Take 0.5-1 tablets (25-50 mg total) by mouth at bedtime as needed for sleep.   No facility-administered encounter medications on file as of 04/27/2024.     Objective:     PHYSICAL EXAMINATION:    VITALS:   Vitals:   04/27/24 1420  Resp: 20  Weight: 132 lb (59.9 kg)  Height: 5' 3 (1.6 m)    GEN:  The patient appears stated age and is in NAD. HEENT:  Normocephalic, atraumatic.   Neurological examination:  General: NAD, well-groomed, appears stated age. Orientation: The patient is alert. Oriented to person, place and not to date.  Cranial nerves: There is good facial symmetry. Very anxious appearing. The speech is not very fluent but clear.  No aphasia or  dysarthria. Fund  of knowledge is appropriate. Recent memory impaired and remote memory is normal.  Attention and concentration are decreased.  Able to name objects and repeat phrases.  Hearing is intact to conversational tone.  Delayed recall  1/3  Sensation: Sensation is intact to light touch throughout Motor: Strength is at least antigravity x4. DTR's 2/4 in UE/LE      03/13/2023   12:00 PM  Montreal Cognitive Assessment   Visuospatial/ Executive (0/5) 4  Naming (0/3) 3  Attention: Read list of digits (0/2) 2  Attention: Read list of letters (0/1) 1  Attention: Serial 7 subtraction starting at 100 (0/3) 1  Language: Repeat phrase (0/2) 1  Language : Fluency (0/1) 0  Abstraction (0/2) 1  Delayed Recall (0/5) 3  Orientation (0/6) 6  Total 22  Adjusted Score (based on education) 22       01/06/2024    3:31 PM 12/05/2023    2:00 PM  MMSE - Mini Mental State Exam  Not completed: Unable to complete   Orientation to time  5  Orientation to Place  5  Registration  3  Attention/ Calculation  5  Recall  2  Language- name 2 objects  2  Language- repeat  1  Language- follow 3 step command  3  Language- read & follow direction  1  Write a sentence  1  Copy design  1  Total score  29       Movement examination: Tone: There is normal tone in the UE/LE Abnormal movements:  no tremor.  No myoclonus.  No asterixis.   Coordination:  There is no decremation with RAM's. Normal finger to nose  Gait and Station: The patient has no difficulty arising out of a deep-seated chair without the use of the hands. The patient's stride length is good.  Gait is cautious and narrow.   Thank you for allowing us  the opportunity to participate in the care of this nice patient. Please do not hesitate to contact us  for any questions or concerns.   Total time spent on today's visit was 27 minutes dedicated to this patient today, preparing to see patient, examining the patient, ordering tests and/or  medications and counseling the patient, documenting clinical information in the EHR or other health record, independently interpreting results and communicating results to the patient/family, discussing treatment and goals, answering patient's questions and coordinating care.  Cc:  Lowne Chase, Yvonne R, DO  [facility] 04/27/2024 3:24 PM

## 2024-04-28 ENCOUNTER — Telehealth: Payer: Self-pay

## 2024-04-28 NOTE — Telephone Encounter (Signed)
 Copied from CRM [id number]. Topic: Clinical - Lab/Test Results >> Apr 28, 2024  2:26 PM Viola F wrote: Reason for CRM: Patient requested call back regarding ultrasound results, says she has a few questions. Please call her at [phone] (M)

## 2024-04-29 ENCOUNTER — Encounter: Payer: Self-pay | Admitting: Acute Care

## 2024-04-29 NOTE — Telephone Encounter (Signed)
 Spoke with patient. Pt wanted to know the next steps and I advised we could place a referral to GYN but pt states she wants to wait

## 2024-05-03 NOTE — Progress Notes (Signed)
   Diabetes Self-Management Education  Visit Type:  Follow-up  Appt. Start Time: 1200 Appt. End Time: 1237  05/07/2024  [name], identified by name and date of birth, is a 68 y.o. female with a diagnosis of Diabetes:  .     ASSESSMENT Patient is here alone for follow up.  Pt reports she cracked a bone after a fall and it is impacting her sitting. Pt reports she has not been able to attend cooking classes and states having difficulty with her email. RD assisted Pt and successfully registered Pt for ADA cooking classes. Pt reports she fees like she has a lot going on and states stress. Pt reports monitoring blood sugar about once weekly before meal 135 mg/dL. Pt reports her goal of PA three times weekly has been challenging and states she plan to continue to attempt to engage as she heals. Pt reports testing after breakfast meals was challenging stating she would forget. Pt states after cereal with milk blood sugar was 134 mg/dL. Pt reports eating oatmeal recently and states she did not evaluate her blood sugar. RD encouraged Pt to avoid skipping meals and aim for balanced meals and snacks. All Pt's questions were answered during today's encounter.   There were no vitals taken for this visit. There is no height or weight on file to calculate BMI.    Diabetes Self-Management Education - 05/07/24 1200       Psychosocial Assessment   What is the hardest part about your diabetes right now, causing you the most concern, or is the most worrisome to you about your diabetes?   Other (comment)   cognitive   Self-care barriers Other (comment)   congitive   Self-management support None    Patient Concerns Support    Special Needs Unable to determine    Preferred Learning Style No preference indicated      Complications   Last HgB A1C per patient/outside source 6.4 %    Are you checking your feet? Yes      Subsequent Visit   Since your last visit have you continued or begun to take  your medications as prescribed? Yes    Since your last visit have you experienced any weight changes? Loss          Learning Objective:  Patient will have a greater understanding of diabetes self-management. Patient education plan is to attend individual and/or group sessions per assessed needs and concerns.  Plan:   Patient Instructions  Test blood sugar 2 hour after a meal   Attend ADA cooking classes  Wt Readings from Last 3 Encounters:  04/27/24 132 lb (59.9 kg)  04/14/24 154 lb (69.9 kg)  04/08/24 157 lb 2 oz (71.3 kg)        Expected Outcomes:    significant barriers to change  Education material provided: My Plate, Snack sheet, and Diabetes Resources BG targets  If problems or questions, patient to contact team via:  Phone  Future DSME appointment: -   PRN

## 2024-05-07 ENCOUNTER — Encounter: Attending: Family Medicine | Admitting: Dietician

## 2024-05-07 DIAGNOSIS — E1165 Type 2 diabetes mellitus with hyperglycemia: Secondary | ICD-10-CM | POA: Insufficient documentation

## 2024-05-07 NOTE — Patient Instructions (Signed)
 Test blood sugar 2 hour after a meal   Attend ADA cooking classes  Wt Readings from Last 3 Encounters:  04/27/24 132 lb (59.9 kg)  04/14/24 154 lb (69.9 kg)  04/08/24 157 lb 2 oz (71.3 kg)

## 2024-05-13 ENCOUNTER — Telehealth: Payer: Self-pay

## 2024-05-13 NOTE — Telephone Encounter (Signed)
 Sent to prior authorization for clinical review. And left message per Jolynn cone will have to reschedule.

## 2024-05-14 ENCOUNTER — Ambulatory Visit (HOSPITAL_COMMUNITY): Admission: RE | Admit: 2024-05-14 | Source: Ambulatory Visit

## 2024-05-28 ENCOUNTER — Other Ambulatory Visit (HOSPITAL_COMMUNITY)
Admission: RE | Admit: 2024-05-28 | Discharge: 2024-05-28 | Disposition: A | Discharge status: Home / Self Care | Source: Other Acute Inpatient Hospital | Attending: Obstetrics and Gynecology | Admitting: Obstetrics and Gynecology

## 2024-05-28 ENCOUNTER — Ambulatory Visit (HOSPITAL_COMMUNITY)
Admission: RE | Admit: 2024-05-28 | Discharge: 2024-05-28 | Disposition: A | Discharge status: Home / Self Care | Source: Ambulatory Visit | Attending: Physician Assistant | Admitting: Physician Assistant

## 2024-05-28 ENCOUNTER — Ambulatory Visit: Payer: Self-pay | Admitting: Neurology

## 2024-05-28 ENCOUNTER — Ambulatory Visit: Admitting: Obstetrics and Gynecology

## 2024-05-28 ENCOUNTER — Encounter: Payer: Self-pay | Admitting: Obstetrics and Gynecology

## 2024-05-28 VITALS — BP 104/76 | HR 87

## 2024-05-28 DIAGNOSIS — G3184 Mild cognitive impairment, so stated: Secondary | ICD-10-CM | POA: Insufficient documentation

## 2024-05-28 DIAGNOSIS — R319 Hematuria, unspecified: Secondary | ICD-10-CM | POA: Insufficient documentation

## 2024-05-28 DIAGNOSIS — K5904 Chronic idiopathic constipation: Secondary | ICD-10-CM

## 2024-05-28 DIAGNOSIS — N3281 Overactive bladder: Secondary | ICD-10-CM

## 2024-05-28 DIAGNOSIS — N393 Stress incontinence (female) (male): Secondary | ICD-10-CM

## 2024-05-28 DIAGNOSIS — R350 Frequency of micturition: Secondary | ICD-10-CM

## 2024-05-28 LAB — POCT URINALYSIS DIP (CLINITEK)
Bilirubin, UA: NEGATIVE
Glucose, UA: NEGATIVE mg/dL
Ketones, POC UA: NEGATIVE mg/dL
Nitrite, UA: NEGATIVE
POC PROTEIN,UA: NEGATIVE
Spec Grav, UA: 1.015 (ref 1.010–1.025)
Urobilinogen, UA: 0.2 U/dL
pH, UA: 6 (ref 5.0–8.0)

## 2024-05-28 LAB — GLUCOSE, CAPILLARY: Glucose-Capillary: 90 mg/dL (ref 70–99)

## 2024-05-28 LAB — URINALYSIS, ROUTINE W REFLEX MICROSCOPIC
Bilirubin Urine: NEGATIVE
Glucose, UA: NEGATIVE mg/dL
Hgb urine dipstick: NEGATIVE
Ketones, ur: NEGATIVE mg/dL
Nitrite: NEGATIVE
Protein, ur: NEGATIVE mg/dL
Specific Gravity, Urine: 1.015 (ref 1.005–1.030)
pH: 6 (ref 5.0–8.0)

## 2024-05-28 MED ORDER — FLUDEOXYGLUCOSE F - 18 (FDG) INJECTION
9.4900 | Freq: Once | INTRAVENOUS | Status: AC
  Administered 2024-05-28: 9.49 via INTRAVENOUS

## 2024-05-28 MED ORDER — TROSPIUM CHLORIDE 20 MG PO TABS: 20.0000 mg | ORAL_TABLET | Freq: Two times a day (BID) | ORAL | 5 refills | Status: AC

## 2024-05-28 NOTE — Assessment & Plan Note (Signed)
-   We discussed the symptoms of overactive bladder (OAB), which include urinary urgency, urinary frequency, nocturia, with or without urge incontinence.  While we do not know the exact etiology of OAB, several treatment options exist. We discussed management including behavioral therapy (decreasing bladder irritants, urge suppression strategies, timed voids, bladder retraining), physical therapy, medication; for refractory cases posterior tibial nerve stimulation, sacral neuromodulation, and intravesical botulinum toxin injection.  - Prescribed trospium 20mg  BID. For anticholinergic medications, we discussed the potential side effects of anticholinergics including dry eyes, dry mouth, constipation, and urinary retention. We discussed that other anticholinergic medications are not recommended due to risk of cognitive dysfunction and she has a history of memory loss.  - Advised to decrease use of bladder irritants: coffee, tea, soda, alcohol - Has history of sleep apnea and does not have a CPAP. We discussed that sleep apnea left untreated can cause increased urine production at night. Will discuss with pt's PCP.

## 2024-05-28 NOTE — Progress Notes (Signed)
 New Patient Evaluation and Consultation  Referring Provider: Antonio Meth, Jamee SAUNDERS, * PCP: Antonio Meth, Jamee SAUNDERS, DO Date of Service: 05/28/2024  SUBJECTIVE Chief Complaint: Urinary Incontinence (Leaks with any motion most of time - can't hold urine- sometimes doesn't feel empties your bladder )  History of Present Illness: Debra [name] is a 68 y.o. Black or African-American female seen in consultation at the request of Dr Antonio Meth for evaluation of incontinence.    Review of records significant for: Has been treated with oxybutynin  which has not been helping bladder symptoms during the day.   Had had a workup for memory loss.   Urinary Symptoms: Leaks urine with laughing, exercise, lifting, going from sitting to standing, with a full bladder, and with movement to the bathroom Leaks several time(s) per day. Rare SUI Has been worsening over the last year.  Pad use:  liners/ mini-pads  Patient is bothered by UI symptoms. She felt oxybutynin  helped some but not much. Was on it for a few months Has done one session of pelvic PT in 2023 but has many other appts so not ideal for her.   Day time voids 1-2/ hr.  Nocturia: 3-4 times per night to void. Voiding dysfunction:  empties bladder well.  Patient does not use a catheter to empty bladder.  When urinating, patient feels she has no difficulties Drinks: 2 cups coffee (sometimes decaf), 2 - 16oz bottles water per day, alcohol a few times a week (depends on her mood), green tea, 2 glasses of soda per day Has sleep apnea but has not used a cpap in years.   UTIs: 0 UTI's in the last year.   Denies history of blood in urine and kidney or bladder stones   Pelvic Organ Prolapse Symptoms:                  Patient Denies a feeling of a bulge the vaginal area.  Bowel Symptom: Bowel movements: 1 time(s) per day Stool consistency: hard Straining: yes.  Splinting: no.  Incomplete evacuation: no.  Patient Denies accidental bowel  leakage / fecal incontinence Bowel regimen: stool softener, metamucil, miralax as needed  HM Colonoscopy          Upcoming     Colonoscopy (Every 10 Years) Next due on 07/27/2031    07/26/2021  COLONOSCOPY   Only the first 1 history entries have been loaded, but more history exists.                Sexual Function Sexually active: no.   Pelvic Pain Denies pelvic pain   Past Medical History:  Past Medical History:  Diagnosis Date   Abdominal pain, right upper quadrant 04/30/2007   Allergies 02/03/2019   Anemia, iron deficiency 04/17/2017   Aortic atherosclerosis 10/10/2017   Atypical chest pain 08/31/2014   Back pain with radiculopathy 01/25/2010   Check xray --- ? Cause of leg weakness   Chronic pain of both shoulders 10/31/2020   Cognitive deficits 2022   Constipation    DDD (degenerative disc disease)    Diastolic dysfunction 02/15/2010   Epilepsy    Childhood seziures; pt reports that she grew out of them and they have not occurred since childhood   Essential hypertension 08/31/2010   Poorly controlled will alter medications, encouraged DASH diet, minimize caffeine and obtain adequate sleep. Report concerning symptoms and follow up as directed and as needed [employer] metoprolol  Edema may be c   Expressive language impairment 04/13/2024  Fatigue 01/25/2010   Generalized anxiety disorder 04/22/2018   Stable co'nt meds   GERD (gastroesophageal reflux disease) 07/16/2007   Hearing loss 07/24/2012   Refer to hearing clinic   Hiatal hernia 07/16/2007   Hyperglycemia, fasting 05/04/2010   Hyperlipidemia 04/17/2017   Encouraged heart healthy diet, increase exercise, avoid trans fats, consider a krill oil cap daily   Hypothyroidism 11/02/2010   con't meds; Lab Results Component Value TSH 1.44 08/12/2017   Insomnia 04/30/2007   Knee pain 07/16/2007   Leg pain, bilateral 11/17/2008   Low back pain 07/16/2007   Major depressive disorder 01/30/2018   con't  meds F/u counselor   Mild cognitive impairment of uncertain or unknown etiology 01/30/2021   Mixed connective tissue disease    with Raynaud's   Obstructive sleep apnea 01/30/2018   F/u with pulm; May be cause of some of her memory/concentration problems   Osteoarthritis    Osteoporosis    Palpitations 02/12/2008   Pansinusitis 01/30/2018   Peripheral vertigo 04/30/2007   Resolved rto prn   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   Plantar wart 05/06/2008   Restless legs syndrome 12/01/2008   Stomach ulcer    Swallowing difficulty    Type 2 diabetes mellitus with hyperglycemia, without long-term current use of insulin  08/09/2022   Upper respiratory tract infection 12/14/2019   Urge incontinence of urine 10/06/2020   Vaginal discharge 05/25/2021   Vitamin D  deficiency    Wound infection 10/04/2021   Wound of right ankle 09/27/2021     Past Surgical History:   Past Surgical History:  Procedure Laterality Date   CARPAL TUNNEL RELEASE     Bilateral   CERVICAL SPINE SURGERY     x3   CESAREAN SECTION     x3   LEG SURGERY     Right    WRIST FRACTURE SURGERY  10/02/2015   Pt have surgery twice on the same wrist.     Past OB/GYN History: OB History  Gravida Para Term Preterm AB Living  7     3  SAB IAB Ectopic Multiple Live Births          # Outcome Date GA Lbr Len/2nd Weight Sex Type Anes PTL Lv  7 Gravida           6 Gravida           5 Gravida           4 Gravida           3 Gravida           2 Gravida           1 Gravida             Obstetric Comments  9lbs   10lbs  11lbs    All c/s     Vaginal deliveries: 0,  Forceps/ Vacuum deliveries: 0, Cesarean section: 3 Menopausal: Denies vaginal bleeding since menopause    Component Value Date/Time   DIAGPAP  05/16/2020 1125    - Negative for intraepithelial lesion or malignancy (NILM)   ADEQPAP  05/16/2020 1125    Satisfactory for evaluation; transformation zone component PRESENT.    Medications: Patient has a  current medication list which includes the following prescription(s): accu-chek softclix lancets, ammonium lactate , azelastine , blood glucose monitoring suppl, donepezil , fluticasone , gabapentin , hydrochlorothiazide , hydrocodone -acetaminophen , levothyroxine , losartan , metoprolol  succinate, montelukast , paroxetine , pravastatin , prednisone , saccharomyces boulardii, ozempic  (1 mg/dose), trazodone , and trospium.   Allergies: Patient is allergic to penicillins and codeine.  Social History:  Social History   Tobacco Use   Smoking status: Every Day    Current packs/day: 0.75    Average packs/day: 0.8 packs/day for 44.0 years (33.0 ttl pk-yrs)    Types: Cigarettes   Smokeless tobacco: Never  Substance Use Topics   Alcohol use: Yes    Comment: 2-3 drinks per week (beer/wine)   Drug use: Yes    Types: Methylphenidate    Relationship status: married Patient lives alone.   Patient is not employed. Regular exercise: Yes: walking, bike at [employer] History of abuse: No  Family History:   Family History  Problem Relation Age of Onset   Breast cancer Mother        possibly 33's   Arthritis Mother        rheumatoid   Cancer Mother        breast   Heart disease Mother 89       MI   Hyperlipidemia Mother    Thyroid  disease Mother    Depression Mother    Anxiety disorder Mother    Heart disease Father        MI   Hypertension Father    Stroke Father    Diabetes Sister    Hypertension Sister    Hyperlipidemia Sister    Memory loss Maternal Aunt    Colon cancer Maternal Grandfather    Esophageal cancer Neg Hx    Rectal cancer Neg Hx    Stomach cancer Neg Hx      Review of Systems: Review of Systems  Constitutional:  Negative for fever, malaise/fatigue and weight loss.  Respiratory:  Negative for cough, shortness of breath and wheezing.   Cardiovascular:  Negative for chest pain, palpitations and leg swelling.  Gastrointestinal:  Negative for abdominal pain and blood in  stool.  Genitourinary:  Negative for dysuria.  Musculoskeletal:  Negative for myalgias.  Skin:  Negative for rash.  Neurological:  Negative for dizziness and headaches.  Endo/Heme/Allergies:  Does not bruise/bleed easily.  Psychiatric/Behavioral:  Negative for depression. The patient is not nervous/anxious.      OBJECTIVE Physical Exam: Vitals:   05/28/24 1533  BP: 104/76  Pulse: 87    Physical Exam Vitals reviewed. Exam conducted with a chaperone present.  Constitutional:      General: She is not in acute distress. Pulmonary:     Effort: Pulmonary effort is normal.  Abdominal:     General: There is no distension.     Palpations: Abdomen is soft.     Tenderness: There is no abdominal tenderness. There is no rebound.  Musculoskeletal:        General: No swelling. Normal range of motion.  Skin:    General: Skin is warm and dry.     Findings: No rash.  Neurological:     Mental Status: She is alert and oriented to person, place, and time.  Psychiatric:        Mood and Affect: Mood normal.        Behavior: Behavior normal.      GU / Detailed Urogynecologic Evaluation:  Pelvic Exam: Normal external female genitalia; Bartholin's and Skene's glands normal in appearance; urethral meatus normal in appearance, no urethral masses or discharge.   CST: negative  Speculum exam reveals normal vaginal mucosa with atrophy. Cervix normal appearance. Uterus slightly enlarged, mobile. Adnexa no mass, fullness, tenderness.     Pelvic floor strength I/V, puborectalis I/V external anal sphincter II/V  Pelvic floor musculature: Right  levator non-tender, Right obturator non-tender, Left levator non-tender, Left obturator non-tender  POP-Q:   POP-Q  -3                                            Aa   -3                                           Ba  -7                                              C   1.5                                            Gh  4                                             Pb  7                                            tvl   -3                                            Ap  -3                                            Bp  -7                                              D      Rectal Exam:  Normal sphincter tone, small hemorrhoid present,  no distal rectocele, enterocoele not present, no rectal masses, no sign of dyssynergia when asking the patient to bear down.  Post-Void Residual (PVR) by Bladder Scan: In order to evaluate bladder emptying, we discussed obtaining a postvoid residual and patient agreed to this procedure.  Procedure: The ultrasound unit was placed on the patient's abdomen in the suprapubic region after the patient had voided.    Post Void Residual - 05/28/24 1541       Post Void Residual   Post Void Residual 57 mL           Laboratory Results: Lab Results  Component Value Date   COLORU yellow 05/28/2024   CLARITYU clear 05/28/2024   GLUCOSEUR negative 05/28/2024   BILIRUBINUR negative 05/28/2024   KETONESU neg 04/14/2024   SPECGRAV 1.015 05/28/2024   RBCUR trace-intact (A) 05/28/2024   PHUR 6.0  05/28/2024   PROTEINUR Negative 04/14/2024   UROBILINOGEN 0.2 05/28/2024   LEUKOCYTESUR Small (1+) (A) 05/28/2024    Lab Results  Component Value Date   CREATININE 0.74 12/02/2023   CREATININE 0.73 07/29/2023   CREATININE 0.80 02/11/2023    Lab Results  Component Value Date   HGBA1C 6.4 12/02/2023    Lab Results  Component Value Date   HGB 11.5 (L) 12/02/2023     ASSESSMENT AND PLAN Ms. Coggeshall is a 68 y.o. with:  1. Overactive bladder   2. SUI (stress urinary incontinence, female)   3. Chronic idiopathic constipation   4. Urinary frequency   5. Hematuria, unspecified type     Overactive bladder Assessment & Plan: - We discussed the symptoms of overactive bladder (OAB), which include urinary urgency, urinary frequency, nocturia, with or without urge incontinence.  While we do not  know the exact etiology of OAB, several treatment options exist. We discussed management including behavioral therapy (decreasing bladder irritants, urge suppression strategies, timed voids, bladder retraining), physical therapy, medication; for refractory cases posterior tibial nerve stimulation, sacral neuromodulation, and intravesical botulinum toxin injection.  - Prescribed trospium 20mg  BID. For anticholinergic medications, we discussed the potential side effects of anticholinergics including dry eyes, dry mouth, constipation, and urinary retention. We discussed that other anticholinergic medications are not recommended due to risk of cognitive dysfunction and she has a history of memory loss.  - Advised to decrease use of bladder irritants: coffee, tea, soda, alcohol - Has history of sleep apnea and does not have a CPAP. We discussed that sleep apnea left untreated can cause increased urine production at night. Will discuss with pt's PCP.    Orders: -     Trospium Chloride; Take 1 tablet (20 mg total) by mouth 2 (two) times daily.  Dispense: 60 tablet; Refill: 5  SUI (stress urinary incontinence, female) Assessment & Plan: - rare symptoms, not bothersome at this time   Chronic idiopathic constipation Assessment & Plan: - For constipation, we reviewed the importance of a better bowel regimen.  We also discussed the importance of avoiding chronic straining, as it can exacerbate her pelvic floor symptoms. We discussed initiating therapy with increasing fluid intake, fiber supplementation, stool softeners, and laxatives such as miralax.  - Recommended starting with daily fiber supplementation such as metamucil or benefiber   Urinary frequency -     POCT URINALYSIS DIP (CLINITEK)  Hematuria, unspecified type -     Urine Culture; Future -     Urine Microscopic; Future   Follow up 6 weeks  Rosaline LOISE Caper, MD

## 2024-05-28 NOTE — Assessment & Plan Note (Signed)
-   For constipation, we reviewed the importance of a better bowel regimen.  We also discussed the importance of avoiding chronic straining, as it can exacerbate her pelvic floor symptoms. We discussed initiating therapy with increasing fluid intake, fiber supplementation, stool softeners, and laxatives such as miralax.  - Recommended starting with daily fiber supplementation such as metamucil or benefiber

## 2024-05-28 NOTE — Patient Instructions (Addendum)
 Today we talked about ways to manage bladder urgency such as altering your diet to avoid irritative beverages and foods (bladder diet) as well as attempting to decrease stress and other exacerbating factors.    The Most Bothersome Foods* The Least Bothersome Foods*  Coffee - Regular & Decaf Tea - caffeinated Carbonated beverages - cola, non-colas, diet & caffeine-free Alcohols - Beer, Red Wine, White Wine, [address] - Grapefruit, [place], Orange, [employer] - Cranberry, Grapefruit, Orange, Pineapple Vegetables - Tomato & Tomato Products Flavor Enhancers - Hot peppers, Spicy foods, Chili, Horseradish, Vinegar, Monosodium glutamate (MSG) Artificial Sweeteners - NutraSweet, Sweet 'N Low, Equal (sweetener), Saccharin Ethnic foods - [place], [place], [place] food [employer] - low-fat & whole Fruits - Bananas, Blueberries, Honeydew melon, Pears, Raisins, Watermelon Vegetables - Broccoli, [address] Sprouts, [place], Carrots, Cauliflower, [place], Cucumber, Mushrooms, Peas, Radishes, Squash, Zucchini, White potatoes, Sweet potatoes & yams Poultry - Chicken, Eggs, [place], [employer] - Beef, Diplomatic Services operational officer, Lamb Seafood - Shrimp, [place] fish, Salmon Grains - Oat, Rice Snacks - Pretzels, Popcorn  *Mitch ALF et al. Diet and its role in interstitial cystitis/bladder pain syndrome (IC/BPS) and comorbid conditions. [employer]. BJU Int. 2012 Jan 11.    We discussed the symptoms of overactive bladder (OAB), which include urinary urgency, urinary frequency, night-time urination, with or without urge incontinence.  We discussed management including behavioral therapy (decreasing bladder irritants by following a bladder diet, urge suppression strategies, timed voids, bladder retraining), physical therapy, medication; and for refractory cases posterior tibial nerve stimulation, sacral neuromodulation, and intravesical botulinum toxin injection.   Prescribed trospium 20mg  twice a day.   Constipation: Our  goal is to achieve formed bowel movements daily or every-other-day.  You may need to try different combinations of the following options to find what works best for you - everybody's body works differently so feel free to adjust the dosages as needed.  Some options to help maintain bowel health include:  Dietary changes (more leafy greens, vegetables and fruits; less processed foods) Fiber supplementation (Benefiber, FiberCon, Metamucil or Psyllium). Start slow and increase gradually to full dose. Over-the-counter agents such as: stool softeners (Docusate or Colace) and/or laxatives (Miralax, milk of magnesia)  Power Pudding is a natural mixture that may help your constipation.  To make blend 1 cup applesauce, 1 cup wheat bran, and 3/4 cup prune juice, refrigerate and then take 1 tablespoon daily with a large glass of water as needed.

## 2024-05-28 NOTE — Assessment & Plan Note (Signed)
-   rare symptoms, not bothersome at this time

## 2024-05-29 LAB — URINE CULTURE: Culture: NO GROWTH

## 2024-05-31 NOTE — Progress Notes (Signed)
 Left message to call office for results of PET scan at 9:28 05/31/2024

## 2024-06-03 ENCOUNTER — Ambulatory Visit: Payer: 59 | Admitting: Physician Assistant

## 2024-06-17 ENCOUNTER — Other Ambulatory Visit: Payer: Self-pay | Admitting: Family Medicine

## 2024-06-21 ENCOUNTER — Other Ambulatory Visit: Payer: Self-pay | Admitting: Family Medicine

## 2024-06-21 DIAGNOSIS — I10 Essential (primary) hypertension: Secondary | ICD-10-CM

## 2024-06-21 MED ORDER — METOPROLOL SUCCINATE ER 100 MG PO TB24: 100.0000 mg | ORAL_TABLET | Freq: Every day | ORAL | 0 refills | Status: DC

## 2024-06-21 NOTE — Telephone Encounter (Signed)
 Copied from CRM [id number]. Topic: Clinical - Medication Refill >> Jun 21, 2024  3:13 PM Drema MATSU wrote: Medication: metoprolol  succinate (TOPROL -XL) 100 MG 24 hr tablet  Has the patient contacted their pharmacy? Yes (Agent: If no, request that the patient contact the pharmacy for the refill. If patient does not wish to contact the pharmacy document the reason why and proceed with request.) advised to call doctor (Agent: If yes, when and what did the pharmacy advise?)  This is the patient's preferred pharmacy:  [facility] [address] [place], [place] - [address] [place] [address] Phone: [phone] Fax: [phone]  Is this the correct pharmacy for this prescription? Yes If no, delete pharmacy and type the correct one.   Has the prescription been filled recently? No  Is the patient out of the medication? Yes  Has the patient been seen for an appointment in the last year OR does the patient have an upcoming appointment? Yes  Can we respond through MyChart? Yes  Agent: Please be advised that Rx refills may take up to 3 business days. We ask that you follow-up with your pharmacy.

## 2024-06-22 ENCOUNTER — Ambulatory Visit (INDEPENDENT_AMBULATORY_CARE_PROVIDER_SITE_OTHER): Admitting: Family Medicine

## 2024-06-22 ENCOUNTER — Ambulatory Visit (HOSPITAL_BASED_OUTPATIENT_CLINIC_OR_DEPARTMENT_OTHER)
Admission: RE | Admit: 2024-06-22 | Discharge: 2024-06-22 | Disposition: A | Discharge status: Home / Self Care | Source: Ambulatory Visit | Attending: Family Medicine | Admitting: Family Medicine

## 2024-06-22 ENCOUNTER — Encounter: Payer: Self-pay | Admitting: Family Medicine

## 2024-06-22 VITALS — BP 110/68 | HR 87 | Temp 98.2°F | Resp 16 | Ht 63.0 in | Wt 151.6 lb

## 2024-06-22 DIAGNOSIS — I8393 Asymptomatic varicose veins of bilateral lower extremities: Secondary | ICD-10-CM

## 2024-06-22 DIAGNOSIS — E1169 Type 2 diabetes mellitus with other specified complication: Secondary | ICD-10-CM

## 2024-06-22 DIAGNOSIS — E1165 Type 2 diabetes mellitus with hyperglycemia: Secondary | ICD-10-CM

## 2024-06-22 DIAGNOSIS — M25522 Pain in left elbow: Secondary | ICD-10-CM | POA: Insufficient documentation

## 2024-06-22 DIAGNOSIS — I10 Essential (primary) hypertension: Secondary | ICD-10-CM

## 2024-06-22 DIAGNOSIS — E039 Hypothyroidism, unspecified: Secondary | ICD-10-CM

## 2024-06-22 DIAGNOSIS — I83893 Varicose veins of bilateral lower extremities with other complications: Secondary | ICD-10-CM | POA: Diagnosis not present

## 2024-06-22 DIAGNOSIS — G4733 Obstructive sleep apnea (adult) (pediatric): Secondary | ICD-10-CM

## 2024-06-22 DIAGNOSIS — E785 Hyperlipidemia, unspecified: Secondary | ICD-10-CM | POA: Diagnosis not present

## 2024-06-22 MED ORDER — LEVOTHYROXINE SODIUM 50 MCG PO TABS
50.0000 ug | ORAL_TABLET | Freq: Every day | ORAL | 2 refills | Status: DC
Start: 2024-06-22 — End: 2024-09-17

## 2024-06-22 MED ORDER — METOPROLOL SUCCINATE ER 100 MG PO TB24
100.0000 mg | ORAL_TABLET | Freq: Every day | ORAL | 1 refills | Status: DC
Start: 2024-06-22 — End: 2024-06-22

## 2024-06-22 MED ORDER — LOSARTAN POTASSIUM 100 MG PO TABS: 100.0000 mg | ORAL_TABLET | Freq: Every day | ORAL | 2 refills | Status: DC

## 2024-06-22 MED ORDER — METOPROLOL SUCCINATE ER 100 MG PO TB24: 100.0000 mg | ORAL_TABLET | Freq: Every day | ORAL | 1 refills | Status: DC

## 2024-06-22 MED ORDER — PRAVASTATIN SODIUM 40 MG PO TABS: 40.0000 mg | ORAL_TABLET | Freq: Every day | ORAL | 1 refills | Status: DC

## 2024-06-22 NOTE — Patient Instructions (Signed)

## 2024-06-22 NOTE — Progress Notes (Signed)
 Subjective:    Patient ID: Debra [name], female    DOB: Apr 27, 1956, 68 y.o.   MRN: [id number]  Chief Complaint  Patient presents with   Hypertension   Hyperlipidemia   Hypothyroidism   Follow-up    HPI Patient is in today for f/u thyroid , dm , lipids and bp.   Discussed the use of AI scribe software for clinical note transcription with the patient, who gave verbal consent to proceed.  History of Present Illness Debra [name] is a 68 year old female who presents with persistent elbow pain.  She has been experiencing persistent pain in her right elbow, described as constant and located underneath the elbow, since a fall in January. The pain radiates down her arm, and she feels the elbow appears 'out of place.' She has a history of carpal tunnel syndrome in the same arm, which has been surgically treated twice, leading to weakness in the hand.  She notes fluctuations in her blood sugar levels, describing them as 'going a little up and a little down.' She has been attempting to manage her blood sugar through dietary adjustments but finds it challenging to understand carbohydrate content in foods and manage her diet effectively.  She experiences dry mouth, which she attributes to her medication, specifically trazodone , which she takes for sleep. She uses biotin spray to alleviate this symptom.  She reports tiredness in her legs and the presence of varicose veins, particularly prominent near the back of her knees. She wears compression socks but finds them tight and notes that her legs swell when she removes them.  She experienced bleeding during a recent sexual encounter, which was her first in over twenty years. The experience was painful, and she describes the area as feeling 'closed and tight.'  She is currently taking metoprolol  and Synthroid  and has expressed a need for refills. She also mentions receiving gabapentin , which she does not find necessary.    Past Medical History:   Diagnosis Date   Abdominal pain, right upper quadrant 04/30/2007   Allergies 02/03/2019   Anemia, iron deficiency 04/17/2017   Aortic atherosclerosis 10/10/2017   Atypical chest pain 08/31/2014   Back pain with radiculopathy 01/25/2010   Check xray --- ? Cause of leg weakness   Chronic pain of both shoulders 10/31/2020   Cognitive deficits 2022   Constipation    DDD (degenerative disc disease)    Diastolic dysfunction 02/15/2010   Epilepsy    Childhood seziures; pt reports that she grew out of them and they have not occurred since childhood   Essential hypertension 08/31/2010   Poorly controlled will alter medications, encouraged DASH diet, minimize caffeine and obtain adequate sleep. Report concerning symptoms and follow up as directed and as needed [employer] metoprolol  Edema may be c   Expressive language impairment 04/13/2024   Fatigue 01/25/2010   Generalized anxiety disorder 04/22/2018   Stable co'nt meds   GERD (gastroesophageal reflux disease) 07/16/2007   Hearing loss 07/24/2012   Refer to hearing clinic   Hiatal hernia 07/16/2007   Hyperglycemia, fasting 05/04/2010   Hyperlipidemia 04/17/2017   Encouraged heart healthy diet, increase exercise, avoid trans fats, consider a krill oil cap daily   Hypothyroidism 11/02/2010   con't meds; Lab Results Component Value TSH 1.44 08/12/2017   Insomnia 04/30/2007   Knee pain 07/16/2007   Leg pain, bilateral 11/17/2008   Low back pain 07/16/2007   Major depressive disorder 01/30/2018   con't meds F/u counselor   Mild  cognitive impairment of uncertain or unknown etiology 01/30/2021   Mixed connective tissue disease    with Raynaud's   Obstructive sleep apnea 01/30/2018   F/u with pulm; May be cause of some of her memory/concentration problems   Osteoarthritis    Osteoporosis    Palpitations 02/12/2008   Pansinusitis 01/30/2018   Peripheral vertigo 04/30/2007   Resolved rto prn   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    Plantar wart 05/06/2008   Restless legs syndrome 12/01/2008   Stomach ulcer    Swallowing difficulty    Type 2 diabetes mellitus with hyperglycemia, without long-term current use of insulin  08/09/2022   Upper respiratory tract infection 12/14/2019   Urge incontinence of urine 10/06/2020   Vaginal discharge 05/25/2021   Vitamin D  deficiency    Wound infection 10/04/2021   Wound of right ankle 09/27/2021    Past Surgical History:  Procedure Laterality Date   CARPAL TUNNEL RELEASE     Bilateral   CERVICAL SPINE SURGERY     x3   CESAREAN SECTION     x3   LEG SURGERY     Right    WRIST FRACTURE SURGERY  10/02/2015   Pt have surgery twice on the same wrist.    Family History  Problem Relation Age of Onset   Breast cancer Mother        possibly 31's   Arthritis Mother        rheumatoid   Cancer Mother        breast   Heart disease Mother 66       MI   Hyperlipidemia Mother    Thyroid  disease Mother    Depression Mother    Anxiety disorder Mother    Heart disease Father        MI   Hypertension Father    Stroke Father    Diabetes Sister    Hypertension Sister    Hyperlipidemia Sister    Memory loss Maternal Aunt    Colon cancer Maternal Grandfather    Esophageal cancer Neg Hx    Rectal cancer Neg Hx    Stomach cancer Neg Hx     Social History   Socioeconomic History   Marital status: Legally Separated    Spouse name: Not on file   Number of children: 3   Years of education: 14   Highest education level: Some college, no degree  Occupational History   Occupation: Security  Tobacco Use   Smoking status: Every Day    Current packs/day: 0.75    Average packs/day: 0.8 packs/day for 44.0 years (33.0 ttl pk-yrs)    Types: Cigarettes   Smokeless tobacco: Never  Substance and Sexual Activity   Alcohol use: Yes    Comment: 2-3 drinks per week (beer/wine)   Drug use: Yes    Types: Methylphenidate   Sexual activity: Yes    Partners: Male    Birth  control/protection: Post-menopausal  Other Topics Concern   Not on file  Social History Narrative   Lives alone   Currently working   Drinks caffeine   Two floor home   ambidextrous   Social Drivers of Health   Financial Resource Strain: Low Risk  (01/06/2024)   Overall Financial Resource Strain (CARDIA)    Difficulty of Paying Living Expenses: Not very hard  Food Insecurity: No Food Insecurity (01/06/2024)   Hunger Vital Sign    Worried About Running Out of Food in the Last Year: Never true    Ran Out  of Food in the Last Year: Never true  Transportation Needs: No Transportation Needs (01/06/2024)   PRAPARE - Administrator, Civil Service (Medical): No    Lack of Transportation (Non-Medical): No  Physical Activity: Inactive (01/06/2024)   Exercise Vital Sign    Days of Exercise per Week: 0 days    Minutes of Exercise per Session: 0 min  Stress: Stress Concern Present (01/06/2024)   [employer] of Occupational Health - Occupational Stress Questionnaire    Feeling of Stress : Very much  Social Connections: Moderately Isolated (01/06/2024)   Social Connection and Isolation Panel    Frequency of Communication with Friends and Family: More than three times a week    Frequency of Social Gatherings with Friends and Family: Once a week    Attends Religious Services: More than 4 times per year    Active Member of [employer] or Organizations: No    Attends Banker Meetings: Never    Marital Status: Separated  Intimate Partner Violence: Not At Risk (01/06/2024)   Humiliation, Afraid, Rape, and Kick questionnaire    Fear of Current or Ex-Partner: No    Emotionally Abused: No    Physically Abused: No    Sexually Abused: No    Outpatient Medications Prior to Visit  Medication Sig Dispense Refill   Accu-Chek Softclix Lancets lancets USE TO CHECK BLOOD GLUCOSE ONCE A DAY 100 each 12   ammonium lactate  (LAC-HYDRIN ) 12 % lotion Apply 1 Application topically as needed  for dry skin. 400 g 0   azelastine  (ASTELIN ) 0.1 % nasal spray Place 2 sprays into both nostrils 2 (two) times daily. Use in each nostril as directed 30 mL 5   Blood Glucose Monitoring Suppl DEVI 1 each by Does not apply route in the morning, at noon, and at bedtime. May substitute to any manufacturer covered by patient's insurance. 1 each 0   donepezil  (ARICEPT ) 5 MG tablet Take 1 tablet (5 mg total) by mouth daily. 30 tablet 11   fluticasone  (FLONASE ) 50 MCG/ACT nasal spray Place 2 sprays into both nostrils daily. 16 g 6   gabapentin  (NEURONTIN ) 600 MG tablet TAKE 1 TABLET BY MOUTH 3 TIMES  DAILY 300 tablet 2   hydrochlorothiazide  (HYDRODIURIL ) 25 MG tablet TAKE 1 TABLET BY MOUTH DAILY 100 tablet 2   HYDROcodone -acetaminophen  (NORCO/VICODIN) 5-325 MG tablet      montelukast  (SINGULAIR ) 10 MG tablet Take 1 tablet (10 mg total) by mouth at bedtime. 90 tablet 0   PARoxetine  (PAXIL ) 40 MG tablet Take 1 tablet (40 mg total) by mouth every morning. 90 tablet 3   predniSONE  (DELTASONE ) 10 MG tablet      saccharomyces boulardii (FLORASTOR) 250 MG capsule Take 1 capsule (250 mg total) by mouth 2 (two) times daily. 60 capsule 5   Semaglutide , 1 MG/DOSE, (OZEMPIC , 1 MG/DOSE,) 4 MG/3ML SOPN Inject 1 mg into the skin once a week. 3 mL 0   traZODone  (DESYREL ) 50 MG tablet Take 0.5-1 tablets (25-50 mg total) by mouth at bedtime as needed for sleep. 30 tablet 3   trospium  (SANCTURA ) 20 MG tablet Take 1 tablet (20 mg total) by mouth 2 (two) times daily. 60 tablet 5   levothyroxine  (SYNTHROID ) 50 MCG tablet Take 1 tablet (50 mcg total) by mouth daily. 100 tablet 2   losartan  (COZAAR ) 100 MG tablet TAKE 1 TABLET BY MOUTH DAILY 100 tablet 2   metoprolol  succinate (TOPROL -XL) 100 MG 24 hr tablet Take 1 tablet (100  mg total) by mouth daily. Take with or immediately following a meal. 90 tablet 0   pravastatin  (PRAVACHOL ) 40 MG tablet Take 1 tablet (40 mg total) by mouth daily. 90 tablet 1   No facility-administered  medications prior to visit.    Allergies  Allergen Reactions   Penicillins    Codeine Rash and Other (See Comments)    dizziness    Review of Systems  Constitutional:  Negative for fever and malaise/fatigue.  HENT:  Negative for congestion.   Eyes:  Negative for blurred vision.  Respiratory:  Negative for cough and shortness of breath.   Cardiovascular:  Negative for chest pain, palpitations and leg swelling.  Gastrointestinal:  Negative for vomiting.  Musculoskeletal:  Positive for joint pain. Negative for back pain.  Skin:  Negative for rash.  Neurological:  Negative for loss of consciousness and headaches.       Objective:    Physical Exam Vitals and nursing note reviewed.  Constitutional:      General: She is not in acute distress.    Appearance: Normal appearance. She is well-developed.  HENT:     Head: Normocephalic and atraumatic.     Right Ear: Tympanic membrane, ear canal and external ear normal. There is no impacted cerumen.     Left Ear: Tympanic membrane, ear canal and external ear normal. There is no impacted cerumen.     Nose: Nose normal.     Mouth/Throat:     Mouth: Mucous membranes are moist.     Pharynx: Oropharynx is clear. No oropharyngeal exudate or posterior oropharyngeal erythema.  Eyes:     General: No scleral icterus.       Right eye: No discharge.        Left eye: No discharge.     Conjunctiva/sclera: Conjunctivae normal.     Pupils: Pupils are equal, round, and reactive to light.  Neck:     Thyroid : No thyromegaly or thyroid  tenderness.     Vascular: No JVD.  Cardiovascular:     Rate and Rhythm: Normal rate and regular rhythm.     Heart sounds: Normal heart sounds. No murmur heard. Pulmonary:     Effort: Pulmonary effort is normal. No respiratory distress.     Breath sounds: Normal breath sounds.  Abdominal:     General: Bowel sounds are normal. There is no distension.     Palpations: Abdomen is soft. There is no mass.     Tenderness:  There is no abdominal tenderness. There is no guarding or rebound.  Genitourinary:    Vagina: Normal.  Musculoskeletal:        General: Normal range of motion.     Cervical back: Normal range of motion and neck supple.     Right lower leg: No edema.     Left lower leg: No edema.  Lymphadenopathy:     Cervical: No cervical adenopathy.  Skin:    General: Skin is warm and dry.     Findings: No erythema or rash.     Comments: Spider veins b/l legs   Neurological:     Mental Status: She is alert and oriented to person, place, and time.     Cranial Nerves: No cranial nerve deficit.     Deep Tendon Reflexes: Reflexes are normal and symmetric.  Psychiatric:        Mood and Affect: Mood normal.        Behavior: Behavior normal.        Thought Content:  Thought content normal.        Judgment: Judgment normal.     BP 110/68 (BP Location: Left Arm, Patient Position: Sitting, Cuff Size: Normal)   Pulse 87   Temp 98.2 F (36.8 C) (Oral)   Resp 16   Ht 5' 3 (1.6 m)   Wt 151 lb 9.6 oz (68.8 kg)   SpO2 98%   BMI 26.85 kg/m  Wt Readings from Last 3 Encounters:  06/22/24 151 lb 9.6 oz (68.8 kg)  04/27/24 132 lb (59.9 kg)  04/14/24 154 lb (69.9 kg)    Diabetic Foot Exam - Simple   Simple Foot Form Diabetic Foot exam was performed with the following findings: Yes 06/22/2024  2:07 PM  Visual Inspection No deformities, no ulcerations, no other skin breakdown bilaterally: Yes Sensation Testing Intact to touch and monofilament testing bilaterally: Yes Pulse Check Posterior Tibialis and Dorsalis pulse intact bilaterally: Yes Comments    Lab Results  Component Value Date   WBC 6.6 12/02/2023   HGB 11.5 (L) 12/02/2023   HCT 33.8 (L) 12/02/2023   PLT 278.0 12/02/2023   GLUCOSE 78 12/02/2023   CHOL 150 12/02/2023   TRIG 102.0 12/02/2023   HDL 50.30 12/02/2023   LDLDIRECT 126.3 06/06/2011   LDLCALC 79 12/02/2023   ALT 23 12/02/2023   AST 30 12/02/2023   NA 140 12/02/2023   K 4.7  12/02/2023   CL 102 12/02/2023   CREATININE 0.74 12/02/2023   BUN 16 12/02/2023   CO2 30 12/02/2023   TSH 1.14 12/02/2023   INR 1.0 05/25/2021   HGBA1C 6.4 12/02/2023   MICROALBUR 0.3 08/09/2022    Lab Results  Component Value Date   TSH 1.14 12/02/2023   Lab Results  Component Value Date   WBC 6.6 12/02/2023   HGB 11.5 (L) 12/02/2023   HCT 33.8 (L) 12/02/2023   MCV 85.5 12/02/2023   PLT 278.0 12/02/2023   Lab Results  Component Value Date   NA 140 12/02/2023   K 4.7 12/02/2023   CO2 30 12/02/2023   GLUCOSE 78 12/02/2023   BUN 16 12/02/2023   CREATININE 0.74 12/02/2023   BILITOT 0.7 12/02/2023   ALKPHOS 79 12/02/2023   AST 30 12/02/2023   ALT 23 12/02/2023   PROT 7.4 12/02/2023   ALBUMIN 4.1 12/02/2023   CALCIUM  9.6 12/02/2023   ANIONGAP 11 08/31/2014   GFR 83.17 12/02/2023   Lab Results  Component Value Date   CHOL 150 12/02/2023   Lab Results  Component Value Date   HDL 50.30 12/02/2023   Lab Results  Component Value Date   LDLCALC 79 12/02/2023   Lab Results  Component Value Date   TRIG 102.0 12/02/2023   Lab Results  Component Value Date   CHOLHDL 3 12/02/2023   Lab Results  Component Value Date   HGBA1C 6.4 12/02/2023       Assessment & Plan:  Primary hypertension -     Metoprolol  Succinate ER; Take 1 tablet (100 mg total) by mouth daily. Take with or immediately following a meal.  Dispense: 90 tablet; Refill: 1  Essential hypertension -     CBC with Differential/Platelet -     Losartan  Potassium; Take 1 tablet (100 mg total) by mouth daily.  Dispense: 100 tablet; Refill: 2  Hypothyroidism, unspecified type -     TSH -     Levothyroxine  Sodium; Take 1 tablet (50 mcg total) by mouth daily.  Dispense: 100 tablet; Refill: 2  OSA (obstructive  sleep apnea)  Type 2 diabetes mellitus with hyperglycemia, without long-term current use of insulin  (HCC) -     Comprehensive metabolic panel with GFR -     Hemoglobin A1c -     Microalbumin /  creatinine urine ratio  Hyperlipidemia associated with type 2 diabetes mellitus (HCC) -     Lipid panel -     Comprehensive metabolic panel with GFR  Left elbow pain -     DG ELBOW COMPLETE LEFT (3+VIEW); Future  Spider veins of both lower extremities -     Ambulatory referral to Vascular Surgery  Varicose veins of both legs with edema -     Ambulatory referral to Vascular Surgery  Other orders -     Pravastatin  Sodium; Take 1 tablet (40 mg total) by mouth daily.  Dispense: 90 tablet; Refill: 1  Assessment and Plan Assessment & Plan Chronic right elbow pain after fall   Chronic right elbow pain persists since a fall in January, with constant pain beneath the elbow and concerns about possible dislocation or structural issues. Order an X-ray of the right elbow.  Type 2 diabetes mellitus with peripheral neuropathy   Type 2 diabetes presents with fluctuating blood glucose levels and peripheral neuropathy, evidenced by hand weakness. She struggles with understanding carbohydrate content in foods despite dietary modifications and has trouble accessing online resources due to password issues. Recommend using a carb counter app or consulting a nutritionist for dietary guidance.  Varicose veins of bilateral lower extremities   Varicose veins in both legs cause fatigue and swelling. Compression socks are uncomfortable and cause swelling when removed. She is interested in specialist evaluation despite insurance not covering treatment. Refer to a vein specialist for evaluation.  Xerostomia   Experiencing dry mouth, likely due to trazodone . Biotin provides some relief, but symptoms persist. Consider discussing alternative treatments with a dentist.  Hypothyroidism   Refill Synthroid  prescription at [facility].  Hypertension   Refill metoprolol  prescription at [facility].    Dacoda Finlay R Lowne Chase, DO

## 2024-06-23 LAB — COMPREHENSIVE METABOLIC PANEL WITH GFR
ALT: 17 U/L (ref 0–35)
AST: 23 U/L (ref 0–37)
Albumin: 4 g/dL (ref 3.5–5.2)
Alkaline Phosphatase: 80 U/L (ref 39–117)
BUN: 14 mg/dL (ref 6–23)
CO2: 30 meq/L (ref 19–32)
Calcium: 9.6 mg/dL (ref 8.4–10.5)
Chloride: 103 meq/L (ref 96–112)
Creatinine, Ser: 0.82 mg/dL (ref 0.40–1.20)
GFR: 73.24 mL/min (ref >60.00)
Glucose, Bld: 86 mg/dL (ref 70–99)
Potassium: 4.5 meq/L (ref 3.5–5.1)
Sodium: 140 meq/L (ref 135–145)
Total Bilirubin: 0.6 mg/dL (ref 0.2–1.2)
Total Protein: 7.2 g/dL (ref 6.0–8.3)

## 2024-06-23 LAB — LIPID PANEL
Cholesterol: 149 mg/dL (ref 0–200)
HDL: 54.7 mg/dL (ref >39.00)
LDL Cholesterol: 82 mg/dL (ref 0–99)
NonHDL: 93.87
Total CHOL/HDL Ratio: 3
Triglycerides: 57 mg/dL (ref 0.0–149.0)
VLDL: 11.4 mg/dL (ref 0.0–40.0)

## 2024-06-23 LAB — CBC WITH DIFFERENTIAL/PLATELET
Basophils Absolute: 0 K/uL (ref 0.0–0.1)
Basophils Relative: 0.6 % (ref 0.0–3.0)
Eosinophils Absolute: 0.1 K/uL (ref 0.0–0.7)
Eosinophils Relative: 1.1 % (ref 0.0–5.0)
HCT: 32.9 % — ABNORMAL LOW (ref 36.0–46.0)
Hemoglobin: 11.1 g/dL — ABNORMAL LOW (ref 12.0–15.0)
Lymphocytes Relative: 31.5 % (ref 12.0–46.0)
Lymphs Abs: 1.8 K/uL (ref 0.7–4.0)
MCHC: 33.7 g/dL (ref 30.0–36.0)
MCV: 84.8 fl (ref 78.0–100.0)
Monocytes Absolute: 0.6 K/uL (ref 0.1–1.0)
Monocytes Relative: 9.9 % (ref 3.0–12.0)
Neutro Abs: 3.2 K/uL (ref 1.4–7.7)
Neutrophils Relative %: 56.9 % (ref 43.0–77.0)
Platelets: 239 K/uL (ref 150.0–400.0)
RBC: 3.88 Mil/uL (ref 3.87–5.11)
RDW: 13.6 % (ref 11.5–15.5)
WBC: 5.6 K/uL (ref 4.0–10.5)

## 2024-06-23 LAB — HEMOGLOBIN A1C: Hgb A1c MFr Bld: 6.5 % (ref 4.6–6.5)

## 2024-06-23 LAB — TSH: TSH: 1.71 u[IU]/mL (ref 0.35–5.50)

## 2024-06-23 LAB — MICROALBUMIN / CREATININE URINE RATIO
Creatinine,U: 124.5 mg/dL
Microalb Creat Ratio: 94 mg/g — ABNORMAL HIGH (ref 0.0–30.0)
Microalb, Ur: 11.7 mg/dL — ABNORMAL HIGH (ref 0.0–1.9)

## 2024-06-28 ENCOUNTER — Other Ambulatory Visit: Payer: Self-pay | Admitting: Family Medicine

## 2024-06-28 ENCOUNTER — Ambulatory Visit: Payer: Self-pay | Admitting: Family Medicine

## 2024-06-28 DIAGNOSIS — R809 Proteinuria, unspecified: Secondary | ICD-10-CM

## 2024-07-01 NOTE — Telephone Encounter (Signed)
 Pt called to ask about several labs. Informed per PCP labs are okay. Pt verbalized understanding.

## 2024-07-13 ENCOUNTER — Ambulatory Visit: Attending: Physician Assistant | Admitting: Speech Pathology

## 2024-07-13 ENCOUNTER — Encounter: Payer: Self-pay | Admitting: Speech Pathology

## 2024-07-13 DIAGNOSIS — R41841 Cognitive communication deficit: Secondary | ICD-10-CM | POA: Insufficient documentation

## 2024-07-13 DIAGNOSIS — R479 Unspecified speech disturbances: Secondary | ICD-10-CM | POA: Diagnosis not present

## 2024-07-13 NOTE — Patient Instructions (Signed)
   Clutter is very bad for your brain - it hurts your concentration and makes your confusion worse  Get help organizing - you need help with this   Set a time to clean out 30 minutes a day - get rid of all that you can - don't keep things!!   Plan to go to [employer] weekly  Go to [employer] and buy new pill organizers (am and pm)  Don't give anyone money or access to your account information  Bring a brother or sister with you next time

## 2024-07-13 NOTE — Therapy (Signed)
 OUTPATIENT SPEECH LANGUAGE PATHOLOGY APHASIA EVALUATION   Patient Name: Debra [name] MRN: [id number] DOB:[birth year], 68 y.o., female Today's Date: 07/13/2024  PCP: Debra Cyndee Jamee JONELLE, DO REFERRING PROVIDER: Dina Camie BRAVO, PA-C  END OF SESSION:  End of Session - 07/13/24 1410     Visit Number 1    Number of Visits 25    Date for SLP Re-Evaluation 10/05/24    SLP Start Time 1403    SLP Stop Time  1450    SLP Time Calculation (min) 47 min    Activity Tolerance Patient tolerated treatment well          Past Medical History:  Diagnosis Date   Abdominal pain, right upper quadrant 04/30/2007   Allergies 02/03/2019   Anemia, iron deficiency 04/17/2017   Aortic atherosclerosis 10/10/2017   Atypical chest pain 08/31/2014   Back pain with radiculopathy 01/25/2010   Check xray --- ? Cause of leg weakness   Chronic pain of both shoulders 10/31/2020   Cognitive deficits 2022   Constipation    DDD (degenerative disc disease)    Diastolic dysfunction 02/15/2010   Epilepsy    Childhood seziures; pt reports that she grew out of them and they have not occurred since childhood   Essential hypertension 08/31/2010   Poorly controlled will alter medications, encouraged DASH diet, minimize caffeine and obtain adequate sleep. Report concerning symptoms and follow up as directed and as needed [employer] metoprolol  Edema may be c   Expressive language impairment 04/13/2024   Fatigue 01/25/2010   Generalized anxiety disorder 04/22/2018   Stable co'nt meds   GERD (gastroesophageal reflux disease) 07/16/2007   Hearing loss 07/24/2012   Refer to hearing clinic   Hiatal hernia 07/16/2007   Hyperglycemia, fasting 05/04/2010   Hyperlipidemia 04/17/2017   Encouraged heart healthy diet, increase exercise, avoid trans fats, consider a krill oil cap daily   Hypothyroidism 11/02/2010   con't meds; Lab Results Component Value TSH 1.44 08/12/2017   Insomnia 04/30/2007   Knee pain  07/16/2007   Leg pain, bilateral 11/17/2008   Low back pain 07/16/2007   Major depressive disorder 01/30/2018   con't meds F/u counselor   Mild cognitive impairment of uncertain or unknown etiology 01/30/2021   Mixed connective tissue disease    with Raynaud's   Obstructive sleep apnea 01/30/2018   F/u with pulm; May be cause of some of her memory/concentration problems   Osteoarthritis    Osteoporosis    Palpitations 02/12/2008   Pansinusitis 01/30/2018   Peripheral vertigo 04/30/2007   Resolved rto prn   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   Plantar wart 05/06/2008   Restless legs syndrome 12/01/2008   Stomach ulcer    Swallowing difficulty    Type 2 diabetes mellitus with hyperglycemia, without long-term current use of insulin  08/09/2022   Upper respiratory tract infection 12/14/2019   Urge incontinence of urine 10/06/2020   Vaginal discharge 05/25/2021   Vitamin D  deficiency    Wound infection 10/04/2021   Wound of right ankle 09/27/2021   Past Surgical History:  Procedure Laterality Date   CARPAL TUNNEL RELEASE     Bilateral   CERVICAL SPINE SURGERY     x3   CESAREAN SECTION     x3   LEG SURGERY     Right    WRIST FRACTURE SURGERY  10/02/2015   Pt have surgery twice on the same wrist.   Patient Active Problem List   Diagnosis Date Noted   Overactive bladder 05/28/2024  Chronic idiopathic constipation 05/28/2024   Sacrodynia 04/15/2024   Pelvic mass 04/15/2024   Expressive language impairment 04/13/2024   Type 2 diabetes mellitus with hyperglycemia, without long-term current use of insulin  08/09/2022   Snapping lateral band due to ligament laxity 02/19/2022   Polyuria 01/01/2022   SUI (stress urinary incontinence, female) 01/01/2022   Trigger thumb, right thumb 09/13/2021   Pruritic dermatitis 05/25/2021   Balance disorder 05/25/2021   Dizziness 05/25/2021   Mild cognitive impairment of uncertain or unknown etiology 01/30/2021   Osteoarthritis    Vitamin D   deficiency    Urinary frequency 10/31/2020   Chronic pain of both shoulders 10/31/2020   Female pattern baldness 10/06/2020   Myalgia 10/06/2020   Urge incontinence of urine 10/06/2020   Allergies 02/03/2019   Acute vaginitis 04/22/2018   Morbid obesity 04/22/2018   Generalized anxiety disorder 04/22/2018   Obstructive sleep apnea 01/30/2018   Major depressive disorder 01/30/2018   Pansinusitis 01/30/2018   Aortic atherosclerosis 10/10/2017   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   Hyperlipidemia 04/17/2017   Anemia, iron deficiency 04/17/2017   Atypical chest pain 08/31/2014   Arm weakness 08/31/2014   Hearing loss 07/24/2012   Hypothyroidism 11/02/2010   Essential hypertension 08/31/2010   Hyperglycemia, fasting 05/04/2010   Diastolic dysfunction 02/15/2010   Back pain with radiculopathy 01/25/2010   Fatigue 01/25/2010   Tobacco use 12/01/2008   Restless legs syndrome 12/01/2008   Leg pain, bilateral 11/17/2008   Palpitations 02/12/2008   GERD (gastroesophageal reflux disease) 07/16/2007   Hiatal hernia 07/16/2007   Knee pain 07/16/2007   Low back pain 07/16/2007   Peripheral vertigo 04/30/2007   Insomnia 04/30/2007   Abdominal pain, right upper quadrant 04/30/2007    ONSET DATE: 04/27/2024 (referral date)  REFERRING DIAG:  Diagnosis  R47.9 (ICD-10-CM) - Speech disturbance, unspecified type    THERAPY DIAG:  Cognitive communication deficit  Rationale for Evaluation and Treatment: Rehabilitation  SUBJECTIVE:   SUBJECTIVE STATEMENT: I say, I can't think of the word all of the time Pt accompanied by: self  PERTINENT HISTORY: From neuro consult: Debra [name] is a very pleasant 68 y.o. RH female with a history of iron deficiency anemia, chronic back pain, GAD, MDD, hearing loss, hypertension, hyperlipidemia, RLS, GERD, DM2, vitamin D  deficiency, hypothyroidism and a diagnosis of mild cognitive impairment likely of multiple etiologies expressive language impairment  presenting today in follow-up for evaluation of memory loss. Patient was on donepezil  5 mg daily, lost her prescription, instructed to refill it. She lives alone, misses bills and medicines,  getting lost when driving. Discussed with her moving to assisted living for better surveillance. She is not interested in this change at this time.    PAIN:  Are you having pain? Yes, chronic, generalized  FALLS: Has patient fallen in last 6 months?  No  LIVING ENVIRONMENT: Lives with: lives alone Lives in: House/apartment  PLOF:  Level of assistance: Independent with ADLs, Independent with IADLs Employment: Part-time employment as needed security for [employer]  PATIENT GOALS: To be able to say what I want to say and remember things  OBJECTIVE:  Note: Objective measures were completed at Evaluation unless otherwise noted.  DIAGNOSTIC FINDINGS: Neurospych consult: Ms. Mruk completed a comprehensive neuropsychological evaluation on 04/13/2024. Please refer to that encounter for the full report and recommendations. Briefly, results suggested primary deficits surrounding expressive language (confrontation naming and semantic fluency in particular) and executive functioning. Weaknesses were also exhibited across processing speed and encoding/retrieval aspects of verbal memory.  Relative to her previous evaluation in April 2022, decline was exhibited across executive functioning, particularly cognitive flexibility and abstract reasoning. Performances across other assessed domains exhibited relative stability over time. The etiology for ongoing dysfunction remains uncertain. There remains the potential for chronic medical factors, moderate microvascular ischemic disease as seen on her most recent brain MRI, untreated sleep apnea, chronic pain, and fairly prominent psychiatric distress to be playing an active role in her testing profile and clinical presentation. Medication side effects, especially caused by  paroxetine /Paxil , metoprolol  succinate/Toprol , prednisone , and hydrocodone , may also be playing an active role. 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 Relative stability over the past 2-3 years is encouraging and should limit these neurological concerns at the present time. However, continued monitoring of her outward language abilities will be extremely important moving forward.     COGNITION: Overall cognitive status: Impaired Areas of impairment:  Attention: Impaired: Selective, Alternating Memory: Impaired: Short term Scientist, research (physical sciences) function: Impaired: Problem solving, Organization, and Planning Functional deficits: forgets meds, forgets bills  AUDITORY COMPREHENSION: Overall auditory comprehension: Impaired: moderately complex YES/NO questions: Impaired: moderately complex Following directions: Impaired: moderately complex Conversation: Moderately Complex Interfering components: attention and anxiety Effective technique: extra processing time, repetition/stressing words, and slowed speech  READING COMPREHENSION: Intact  EXPRESSION: verbal  VERBAL EXPRESSION: Level of generative/spontaneous verbalization: conversation Automatic speech: name: intact and social response: intact  Repetition: Appears intact Naming: Confrontation: 76-100% and Divergent: 26-50% Pragmatics: Appears intact Comments: named 8 animals and 6 m words in 1 minute Interfering components: attention Effective technique: semantic cues and phonemic cues Non-verbal means of communication: N/A  WRITTEN EXPRESSION: Dominant hand: right Written expression: Appears intact  MOTOR SPEECH: Overall motor speech: Appears intact   ORAL MOTOR EXAMINATION: Overall status: WFL Comments:   STANDARDIZED ASSESSMENTS: QAB: Mild  PATIENT REPORTED OUTCOME MEASURES  (PROM): Deferred                                                                                                                             TREATMENT DATE:   07/13/24: Evaluation completed. Initiated training in compensatory strategies for recall. The days have worn off of her pill organizer - she has tried to use marker but it wears off. She uses the organizer out of order. Instructed her to get another new pill organizer and use the correct day of the week when taking pills. Initiated strategy of using an appointment book as she has difficulty recalling an appointment when it is 6 months away - she uses My Chart for upcoming appointments however some of her specialty doctors don't use My Chart. Due to endorsed clutter and difficulty getting started on organizing clutter, we generated strategy of using a timer and working for 30 minutes 5/7 days on reducing clutter.     PATIENT EDUCATION: Education details: See treatment, See Patient Instructions, compensations for aphasia and cognition Person educated:  Patient Education method: Explanation, Demonstration, Verbal cues, and Handouts Education comprehension: verbal cues required and needs further education   GOALS: Goals reviewed with patient? Yes  SHORT TERM GOALS: Target date: 08/24/24  Pt will use external aids to recall meds over 1 week Baseline: Goal status: INITIAL  2.  Pt will use appointment book or other external aid to recall and manage appointments Baseline:  Goal status: INITIAL  3 Pt will carryover organization system to recall and manage bills with rare min A. Baseline:  Goal status: INITIAL  4.  Pt will reduce clutter in her kitchen by 50% subjectively to support cognition by using timer and schedule Baseline:  Goal status: INITIAL  5.  Pt will use compensations for aphasia in structured language task 4/5 opportunities with occasional min A Baseline:  Goal status: INITIAL   LONG TERM GOALS: Target date:  10/05/24  Pt will recall meds and bills with rare min A over 2 weeks Baseline:  Goal status: INITIAL  2.  Pt will use compensations for aphasia 3/4 opportunities in conversation with occasional min A Baseline:  Goal status: INITIAL  3.  Pt will use external aids/agenda to recall and manage appointments Baseline:  Goal status: INITIAL  4.  Pt will reduce clutter in main living area by 50% using strategies of time and schedule Baseline:  Goal status: INITIAL   ASSESSMENT:  CLINICAL IMPRESSION: Patient is a 68 y.o. female who was seen today for mild cognitive impairment and mild aphasia. She reports difficulty recalling meds and bills. She also reports difficulty keeping organized and managing clutter in the home. She states is difficulty to initiate reducing clutter and she feels overwhelmed with the amount of clutter thorough out the home. She is known to us  from prior course of ST where she can for 2 visits, then again in Feb of 2025, however she states she doesn't recall ever being in the ST office and denies prior ST indicating poor memory. She is getting lost and drove around for a while looking for this building. I fear she is high risk of being scammed. I recommend skilled ST to maximize communication and cognition for safety and independence.    OBJECTIVE IMPAIRMENTS: include attention, memory, executive functioning, and aphasia. These impairments are limiting patient from managing medications, managing appointments, managing finances, household responsibilities, ADLs/IADLs, and effectively communicating at home and in community. Factors affecting potential to achieve goals and functional outcome are ability to learn/carryover information and family/community support. Patient will benefit from skilled SLP services to address above impairments and improve overall function.  REHAB POTENTIAL: Good  PLAN:  SLP FREQUENCY: 1-2x/week  SLP DURATION: 12 weeks  PLANNED INTERVENTIONS:  Language facilitation, Environmental controls, Cueing hierachy, Cognitive reorganization, Internal/external aids, Functional tasks, Multimodal communication approach, SLP instruction and feedback, Compensatory strategies, Patient/family education, and [id number] Treatment of speech (30 or 45 min)     Aleria Maheu, Leita Caldron, CCC-SLP 07/13/2024, 3:31 PM

## 2024-07-19 ENCOUNTER — Ambulatory Visit: Admitting: Obstetrics and Gynecology

## 2024-07-21 ENCOUNTER — Ambulatory Visit: Payer: Self-pay

## 2024-07-21 NOTE — Telephone Encounter (Signed)
 FYI Only or Action Required?: FYI only for provider.  Patient was last seen in primary care on 06/22/2024 by Antonio Meth, Jamee SAUNDERS, DO.  Called Nurse Triage reporting Dizziness.  Symptoms began several days ago.  Interventions attempted: Nothing.  Symptoms are: gradually worsening.  Triage Disposition: See PCP When Office is Open (Within 3 Days)  Patient/caregiver understands and will follow disposition?: Unsure     Copied from CRM [id number]. Topic: Clinical - Red Word Triage >> Jul 21, 2024  5:11 PM Lauren C wrote: Red Word that prompted transfer to Nurse Triage: Dizzy, feels like falling, uncomfortable. Thinks it is high bp or  blood sugar, but her head doesn't hurt like it usually does when high bp. Latest bp readings are 146/88 and 132/84 and last night's blood sugar is 156. Reason for Disposition  [1] MILD dizziness (e.g., walking normally) AND [2] has NOT been evaluated by doctor (or NP/PA) for this  (Exception: Dizziness caused by heat exposure, sudden standing, or poor fluid intake.)  Answer Assessment - Initial Assessment Questions She states yesterday her BS was 156 and is unable to check BS currently due to leaving supplies at home. Offered patient an appointment in office this week and patient declined due to her work schedule. Pt given UC information to be seen there for symptoms.       1. DESCRIPTION: Describe your dizziness.     Feels like she is about to fall out when symptoms occur.  2. LIGHTHEADED: Do you feel lightheaded? (e.g., somewhat faint, woozy, weak upon standing)     Feels off balanced  3. VERTIGO: Do you feel like either you or the room is spinning or tilting? (i.e., vertigo)     Denies  4. SEVERITY: How bad is it?  Do you feel like you are going to faint? Can you stand and walk?     Can stand and walk now  5. ONSET:  When did the dizziness begin?     Last week Friday  6. AGGRAVATING FACTORS: Does anything make it worse? (e.g.,  standing, change in head position)     Standing up and when her head changes certain positions  7. HEART RATE: Can you tell me your heart rate? How many beats in 15 seconds?  (Note: Not all patients can do this.)       Had some heart racing earlier  8. RECURRENT SYMPTOM: Have you had dizziness before? If Yes, ask: When was the last time? What happened that time?     Yes, she states symptoms occurred every blue moon  9. OTHER SYMPTOMS: Do you have any other symptoms? (e.g., fever, chest pain, vomiting, diarrhea, bleeding)       Back pain, headaches, chest pain right now a 3/10  Protocols used: Dizziness - Lightheadedness-A-AH

## 2024-07-22 ENCOUNTER — Ambulatory Visit: Payer: Self-pay

## 2024-07-22 ENCOUNTER — Other Ambulatory Visit: Payer: Self-pay | Admitting: Family Medicine

## 2024-07-22 MED ORDER — OZEMPIC (1 MG/DOSE) 4 MG/3ML ~~LOC~~ SOPN: 1.0000 mg | PEN_INJECTOR | SUBCUTANEOUS | 0 refills | Status: DC

## 2024-07-22 NOTE — Telephone Encounter (Signed)
 FYI Only or Action Required?: Action required by provider: medication refill request.  Patient was last seen in primary care on 06/22/2024 by Antonio Meth, Jamee SAUNDERS, DO.  Called Nurse Triage reporting Blood Sugar Problem and Medication Refill.  Symptoms began several days ago.  Interventions attempted: Nothing.  Symptoms are: unchanged.  Triage Disposition: See Physician Within 24 Hours  Patient/caregiver understands and will follow disposition?: Yes  Copied from CRM [id number]. Topic: Clinical - Red Word Triage >> Jul 22, 2024  5:53 PM Drema MATSU wrote: Red Word that prompted transfer to Nurse Triage: Patient is calling to check the status of Ozempic . Patient states that she really need medication on today she is having symptoms of not having medication.  Dizziness and high blood sugar   ----------------------------------------------------------------------- From previous Reason for Contact - Medication Question: Reason for CRM: Reason for Disposition  [1] MODERATE dizziness (e.g., interferes with normal activities) AND [2] has NOT been evaluated by doctor (or NP/PA) for this  (Exception: Dizziness caused by heat exposure, sudden standing, or poor fluid intake.)    Nurse judgement  Answer Assessment - Initial Assessment Questions Advised UC/ED today.   Patient requesting refill for Ozempic  medication.  Patient reports blurred vision and being off balanced, reports has been going on for years, but happening more now. Denies HA, diff breathing, diff speaking/walking, numbness, weakness.  1. BLOOD GLUCOSE: What is your blood glucose level?      Last checked an hour ago; 146 2. ONSET: When did you check the blood glucose?     An hour ago 3. USUAL RANGE: What is your glucose level usually? (e.g., usual fasting morning value, usual evening value)     140s  5. TYPE 1 or 2:  Do you know what type of diabetes you have?  (e.g., Type 1, Type 2, Gestational; doesn't know)       Type 2 6. INSULIN : Do you take insulin ? What type of insulin (s) do you use? What is the mode of delivery? (syringe, pen; injection or pump)?      Takes Ozempic , but currently out of medication.  7. DIABETES PILLS: Do you take any pills for your diabetes? If Yes, ask: Have you missed taking any pills recently?     no 8. OTHER SYMPTOMS: Do you have any symptoms? (e.g., fever, frequent urination, difficulty breathing, dizziness, weakness, vomiting)     Denies HA, dizziness, numbness/weakness, n/v  Reports blurred vision, off balance when walking earlier today,   Blurred vision and Being off balance for years; comes and goes, suppose to have an appt with neurology.  Answer Assessment - Initial Assessment Questions 1. BLOOD GLUCOSE: What is your blood glucose level?      Last checked an hour ago; 146 2. ONSET: When did you check the blood glucose?     An hour ago 3. USUAL RANGE: What is your glucose level usually? (e.g., usual fasting morning value, usual evening value)     140s  5. TYPE 1 or 2:  Do you know what type of diabetes you have?  (e.g., Type 1, Type 2, Gestational; doesn't know)      Type 2 6. INSULIN : Do you take insulin ? What type of insulin (s) do you use? What is the mode of delivery? (syringe, pen; injection or pump)?      Takes Ozempic , but currently out of medication.  7. DIABETES PILLS: Do you take any pills for your diabetes? If Yes, ask: Have you missed taking any pills recently?  no 8. OTHER SYMPTOMS: Do you have any symptoms? (e.g., fever, frequent urination, difficulty breathing, dizziness, weakness, vomiting)     Denies HA, dizziness, numbness/weakness, n/v  Reports blurred vision, off balance when walking earlier today,   Being off balance for years; comes and goes  Protocols used: Diabetes - High Blood Sugar-A-AH, Dizziness - Lightheadedness-A-AH

## 2024-07-22 NOTE — Telephone Encounter (Signed)
 Pt reports out of med

## 2024-07-22 NOTE — Telephone Encounter (Signed)
 LMOM asking for call back.

## 2024-07-22 NOTE — Telephone Encounter (Signed)
 FYI Only or Action Required?: FYI only for provider.  Patient was last seen in primary care on 06/22/2024 by Antonio Meth, Jamee SAUNDERS, DO.  Called Nurse Triage reporting Dizziness.  Symptoms began yesterday.  Interventions attempted: Nothing.  Symptoms are: gradually improving.  Triage Disposition: See Physician Within 24 Hours- pt was advised to go to UC but refused stating her PCP didn't want her to go. RN advised note stated she did. RN did make an appt with PCP, pt refused appt tomorrow in office with another provider.   Patient/caregiver understands and will follow disposition?: Yes   Copied from CRM [id number]. Topic: Clinical - Red Word Triage >> Jul 21, 2024  5:11 PM Lauren C wrote: Red Word that prompted transfer to Nurse Triage: Dizzy, feels like falling, uncomfortable. Thinks it is high bp or  blood sugar, but her head doesn't hurt like it usually does when high bp. Latest bp readings are 146/88 and 132/84 and last night's blood sugar is 156. >> Jul 22, 2024  3:31 PM Mia F wrote: Pt returning office call about yesterdays red word message. Per pcp, she wanted to check on how pt was feeling. Pt says she is still feeling bad. She says her balance is off and her vision is off as well. Pt sayd she did not go to urgent care.  Reason for Disposition  [1] MODERATE dizziness (e.g., vertigo; feels very unsteady, interferes with normal activities) AND [2] has NOT been evaluated by doctor (or NP/PA) for this  Answer Assessment - Initial Assessment Questions Headache, feels lightly better compared to yesterday. She states she thinks that it's that from her BS being higher. She states that she didn't take her ozempic  yesterday.    1. DESCRIPTION: Describe your dizziness.     Feels off balance  2. VERTIGO: Do you feel like either you or the room is spinning or tilting?      Off balance 3. LIGHTHEADED: Do you feel lightheaded? (e.g., somewhat faint, woozy, weak upon standing)    Pt  states that she just feels off balance 4. SEVERITY: How bad is it?  Can you walk?     Yes can walk 5. ONSET:  When did the dizziness begin?     Off and on for good little while  6. AGGRAVATING FACTORS: Does anything make it worse? (e.g., standing, change in head position)     Thinks bs may be affecting 7. CAUSE: What do you think is causing the dizziness?     Thought it was what she was eating 8. RECURRENT SYMPTOM: Have you had dizziness before? If Yes, ask: When was the last time? What happened that time?     Yes, on and off  9. OTHER SYMPTOMS: Do you have any other symptoms? (e.g., earache, headache, numbness, tinnitus, vomiting, weakness)     Vomiting this past week, headache toward back of her neck and into her head.  Protocols used: Dizziness - Vertigo-A-AH

## 2024-07-22 NOTE — Telephone Encounter (Signed)
 Per note- Pt instructed to go to UC, I don't see that she went.

## 2024-07-23 NOTE — Telephone Encounter (Unsigned)
 Copied from CRM #[id number]. Topic: General - Other >> Jul 23, 2024  9:24 AM Rea ORN wrote: Reason for CRM: Pt wanted PCP pt be advised that she did not go to UC last night because when she got there it was closed. She will keep her appt on Monday

## 2024-07-23 NOTE — Telephone Encounter (Signed)
 Appt scheduled

## 2024-07-26 ENCOUNTER — Emergency Department (HOSPITAL_BASED_OUTPATIENT_CLINIC_OR_DEPARTMENT_OTHER)

## 2024-07-26 ENCOUNTER — Inpatient Hospital Stay (HOSPITAL_BASED_OUTPATIENT_CLINIC_OR_DEPARTMENT_OTHER)
Admission: EM | Admit: 2024-07-26 | Discharge: 2024-08-03 | DRG: 871 | Disposition: A | Discharge status: Skilled Nursing Facility | Attending: Internal Medicine | Admitting: Internal Medicine

## 2024-07-26 ENCOUNTER — Ambulatory Visit: Admitting: Family Medicine

## 2024-07-26 ENCOUNTER — Other Ambulatory Visit: Payer: Self-pay

## 2024-07-26 ENCOUNTER — Encounter (HOSPITAL_BASED_OUTPATIENT_CLINIC_OR_DEPARTMENT_OTHER): Payer: Self-pay | Admitting: Emergency Medicine

## 2024-07-26 DIAGNOSIS — F17210 Nicotine dependence, cigarettes, uncomplicated: Secondary | ICD-10-CM | POA: Diagnosis not present

## 2024-07-26 DIAGNOSIS — N179 Acute kidney failure, unspecified: Secondary | ICD-10-CM | POA: Diagnosis present

## 2024-07-26 DIAGNOSIS — R519 Headache, unspecified: Secondary | ICD-10-CM | POA: Diagnosis not present

## 2024-07-26 DIAGNOSIS — R6520 Severe sepsis without septic shock: Secondary | ICD-10-CM | POA: Diagnosis not present

## 2024-07-26 DIAGNOSIS — R000 Tachycardia, unspecified: Secondary | ICD-10-CM | POA: Diagnosis not present

## 2024-07-26 DIAGNOSIS — F329 Major depressive disorder, single episode, unspecified: Secondary | ICD-10-CM | POA: Diagnosis present

## 2024-07-26 DIAGNOSIS — Z79899 Other long term (current) drug therapy: Secondary | ICD-10-CM

## 2024-07-26 DIAGNOSIS — F411 Generalized anxiety disorder: Secondary | ICD-10-CM | POA: Diagnosis present

## 2024-07-26 DIAGNOSIS — Z833 Family history of diabetes mellitus: Secondary | ICD-10-CM | POA: Diagnosis not present

## 2024-07-26 DIAGNOSIS — R935 Abnormal findings on diagnostic imaging of other abdominal regions, including retroperitoneum: Secondary | ICD-10-CM | POA: Diagnosis not present

## 2024-07-26 DIAGNOSIS — Z880 Allergy status to penicillin: Secondary | ICD-10-CM

## 2024-07-26 DIAGNOSIS — I5032 Chronic diastolic (congestive) heart failure: Secondary | ICD-10-CM | POA: Diagnosis not present

## 2024-07-26 DIAGNOSIS — Z7985 Long-term (current) use of injectable non-insulin antidiabetic drugs: Secondary | ICD-10-CM | POA: Diagnosis not present

## 2024-07-26 DIAGNOSIS — R079 Chest pain, unspecified: Secondary | ICD-10-CM | POA: Diagnosis not present

## 2024-07-26 DIAGNOSIS — N10 Acute tubulo-interstitial nephritis: Secondary | ICD-10-CM | POA: Diagnosis present

## 2024-07-26 DIAGNOSIS — E039 Hypothyroidism, unspecified: Secondary | ICD-10-CM | POA: Diagnosis present

## 2024-07-26 DIAGNOSIS — N12 Tubulo-interstitial nephritis, not specified as acute or chronic: Principal | ICD-10-CM | POA: Diagnosis present

## 2024-07-26 DIAGNOSIS — E785 Hyperlipidemia, unspecified: Secondary | ICD-10-CM | POA: Diagnosis not present

## 2024-07-26 DIAGNOSIS — E876 Hypokalemia: Secondary | ICD-10-CM | POA: Diagnosis present

## 2024-07-26 DIAGNOSIS — A4151 Sepsis due to Escherichia coli [E. coli]: Secondary | ICD-10-CM | POA: Diagnosis present

## 2024-07-26 DIAGNOSIS — N2889 Other specified disorders of kidney and ureter: Secondary | ICD-10-CM | POA: Diagnosis not present

## 2024-07-26 DIAGNOSIS — D649 Anemia, unspecified: Secondary | ICD-10-CM | POA: Diagnosis present

## 2024-07-26 DIAGNOSIS — E119 Type 2 diabetes mellitus without complications: Secondary | ICD-10-CM | POA: Diagnosis not present

## 2024-07-26 DIAGNOSIS — I110 Hypertensive heart disease with heart failure: Secondary | ICD-10-CM | POA: Diagnosis present

## 2024-07-26 DIAGNOSIS — G2581 Restless legs syndrome: Secondary | ICD-10-CM | POA: Diagnosis present

## 2024-07-26 DIAGNOSIS — Z8249 Family history of ischemic heart disease and other diseases of the circulatory system: Secondary | ICD-10-CM | POA: Diagnosis not present

## 2024-07-26 DIAGNOSIS — G9341 Metabolic encephalopathy: Secondary | ICD-10-CM | POA: Diagnosis not present

## 2024-07-26 DIAGNOSIS — Z79890 Hormone replacement therapy (postmenopausal): Secondary | ICD-10-CM | POA: Diagnosis not present

## 2024-07-26 DIAGNOSIS — Z1152 Encounter for screening for COVID-19: Secondary | ICD-10-CM

## 2024-07-26 DIAGNOSIS — G4733 Obstructive sleep apnea (adult) (pediatric): Secondary | ICD-10-CM | POA: Diagnosis present

## 2024-07-26 DIAGNOSIS — G3184 Mild cognitive impairment, so stated: Secondary | ICD-10-CM | POA: Diagnosis not present

## 2024-07-26 DIAGNOSIS — R109 Unspecified abdominal pain: Secondary | ICD-10-CM | POA: Diagnosis not present

## 2024-07-26 DIAGNOSIS — A419 Sepsis, unspecified organism: Secondary | ICD-10-CM

## 2024-07-26 DIAGNOSIS — R9082 White matter disease, unspecified: Secondary | ICD-10-CM | POA: Diagnosis not present

## 2024-07-26 LAB — CBC WITH DIFFERENTIAL/PLATELET
Abs Immature Granulocytes: 0.25 K/uL — ABNORMAL HIGH (ref 0.00–0.07)
Basophils Absolute: 0 K/uL (ref 0.0–0.1)
Basophils Relative: 0 %
Eosinophils Absolute: 0 K/uL (ref 0.0–0.5)
Eosinophils Relative: 0 %
HCT: 31.4 % — ABNORMAL LOW (ref 36.0–46.0)
Hemoglobin: 10.8 g/dL — ABNORMAL LOW (ref 12.0–15.0)
Immature Granulocytes: 1 %
Lymphocytes Relative: 5 %
Lymphs Abs: 1 K/uL (ref 0.7–4.0)
MCH: 28.8 pg (ref 26.0–34.0)
MCHC: 34.4 g/dL (ref 30.0–36.0)
MCV: 83.7 fL (ref 80.0–100.0)
Monocytes Absolute: 1.5 K/uL — ABNORMAL HIGH (ref 0.1–1.0)
Monocytes Relative: 8 %
Neutro Abs: 16.5 K/uL — ABNORMAL HIGH (ref 1.7–7.7)
Neutrophils Relative %: 86 %
Platelets: 177 K/uL (ref 150–400)
RBC: 3.75 MIL/uL — ABNORMAL LOW (ref 3.87–5.11)
RDW: 13.2 % (ref 11.5–15.5)
WBC: 19.3 K/uL — ABNORMAL HIGH (ref 4.0–10.5)
nRBC: 0 % (ref 0.0–0.2)

## 2024-07-26 LAB — RESP PANEL BY RT-PCR (RSV, FLU A&B, COVID)  RVPGX2
Influenza A by PCR: NEGATIVE
Influenza B by PCR: NEGATIVE
Resp Syncytial Virus by PCR: NEGATIVE
SARS Coronavirus 2 by RT PCR: NEGATIVE

## 2024-07-26 LAB — COMPREHENSIVE METABOLIC PANEL WITH GFR
ALT: 16 U/L (ref 0–44)
AST: 28 U/L (ref 15–41)
Albumin: 3.8 g/dL (ref 3.5–5.0)
Alkaline Phosphatase: 100 U/L (ref 38–126)
Anion gap: 15 (ref 5–15)
BUN: 24 mg/dL — ABNORMAL HIGH (ref 8–23)
CO2: 22 mmol/L (ref 22–32)
Calcium: 9.5 mg/dL (ref 8.9–10.3)
Chloride: 97 mmol/L — ABNORMAL LOW (ref 98–111)
Creatinine, Ser: 1.36 mg/dL — ABNORMAL HIGH (ref 0.44–1.00)
GFR, Estimated: 42 mL/min — ABNORMAL LOW (ref >60)
Glucose, Bld: 153 mg/dL — ABNORMAL HIGH (ref 70–99)
Potassium: 3.4 mmol/L — ABNORMAL LOW (ref 3.5–5.1)
Sodium: 134 mmol/L — ABNORMAL LOW (ref 135–145)
Total Bilirubin: 1.1 mg/dL (ref 0.0–1.2)
Total Protein: 7.7 g/dL (ref 6.5–8.1)

## 2024-07-26 LAB — URINALYSIS, W/ REFLEX TO CULTURE (INFECTION SUSPECTED)
Bilirubin Urine: NEGATIVE
Glucose, UA: NEGATIVE mg/dL
Ketones, ur: NEGATIVE mg/dL
Nitrite: POSITIVE — AB
Protein, ur: 100 mg/dL — AB
Specific Gravity, Urine: <=1.005 (ref 1.005–1.030)
WBC, UA: >50 WBC/hpf (ref 0–5)
pH: 6.5 (ref 5.0–8.0)

## 2024-07-26 LAB — LACTIC ACID, PLASMA
Lactic Acid, Venous: 1.5 mmol/L (ref 0.5–1.9)
Lactic Acid, Venous: 1.7 mmol/L (ref 0.5–1.9)
Lactic Acid, Venous: 1.8 mmol/L (ref 0.5–1.9)

## 2024-07-26 LAB — TROPONIN T, HIGH SENSITIVITY
Troponin T High Sensitivity: <15 ng/L (ref 0–19)
Troponin T High Sensitivity: <15 ng/L (ref 0–19)

## 2024-07-26 LAB — CBG MONITORING, ED: Glucose-Capillary: 164 mg/dL — ABNORMAL HIGH (ref 70–99)

## 2024-07-26 LAB — GLUCOSE, CAPILLARY
Glucose-Capillary: 116 mg/dL — ABNORMAL HIGH (ref 70–99)
Glucose-Capillary: 148 mg/dL — ABNORMAL HIGH (ref 70–99)

## 2024-07-26 MED ORDER — ONDANSETRON HCL 4 MG PO TABS
4.0000 mg | ORAL_TABLET | Freq: Four times a day (QID) | ORAL | Status: DC | PRN
  Administered 2024-07-30: 4 mg via ORAL
  Filled 2024-07-26: qty 1

## 2024-07-26 MED ORDER — ACETAMINOPHEN 650 MG RE SUPP: 650.0000 mg | Freq: Four times a day (QID) | RECTAL | Status: DC | PRN

## 2024-07-26 MED ORDER — LACTATED RINGERS IV BOLUS (SEPSIS)
1000.0000 mL | Freq: Once | INTRAVENOUS | Status: AC
  Administered 2024-07-26: 1000 mL via INTRAVENOUS

## 2024-07-26 MED ORDER — ACETAMINOPHEN 325 MG PO TABS
650.0000 mg | ORAL_TABLET | Freq: Once | ORAL | Status: AC
  Administered 2024-07-26: 650 mg via ORAL
  Filled 2024-07-26: qty 2

## 2024-07-26 MED ORDER — IOHEXOL 300 MG/ML  SOLN
100.0000 mL | Freq: Once | INTRAMUSCULAR | Status: AC | PRN
  Administered 2024-07-26: 80 mL via INTRAVENOUS

## 2024-07-26 MED ORDER — DONEPEZIL HCL 10 MG PO TABS
5.0000 mg | ORAL_TABLET | Freq: Every day | ORAL | Status: DC
  Administered 2024-07-27 – 2024-08-03 (×8): 5 mg via ORAL
  Filled 2024-07-26 (×8): qty 1

## 2024-07-26 MED ORDER — HEPARIN SODIUM (PORCINE) 5000 UNIT/ML IJ SOLN
5000.0000 [IU] | Freq: Three times a day (TID) | INTRAMUSCULAR | Status: DC
  Administered 2024-07-26 – 2024-08-03 (×23): 5000 [IU] via SUBCUTANEOUS
  Filled 2024-07-26 (×24): qty 1

## 2024-07-26 MED ORDER — INSULIN ASPART 100 UNIT/ML IJ SOLN
0.0000 [IU] | Freq: Three times a day (TID) | INTRAMUSCULAR | Status: DC
  Administered 2024-07-27: 2 [IU] via SUBCUTANEOUS

## 2024-07-26 MED ORDER — SODIUM CHLORIDE 0.9 % IV BOLUS
500.0000 mL | Freq: Once | INTRAVENOUS | Status: AC
  Administered 2024-07-26: 500 mL via INTRAVENOUS

## 2024-07-26 MED ORDER — SODIUM CHLORIDE 0.9 % IV SOLN
2.0000 g | Freq: Once | INTRAVENOUS | Status: AC
  Administered 2024-07-26: 2 g via INTRAVENOUS

## 2024-07-26 MED ORDER — METOPROLOL SUCCINATE ER 100 MG PO TB24: 100.0000 mg | ORAL_TABLET | Freq: Every day | ORAL | Status: DC

## 2024-07-26 MED ORDER — LEVOTHYROXINE SODIUM 50 MCG PO TABS
50.0000 ug | ORAL_TABLET | Freq: Every day | ORAL | Status: DC
  Administered 2024-07-27 – 2024-08-03 (×8): 50 ug via ORAL
  Filled 2024-07-26 (×8): qty 1

## 2024-07-26 MED ORDER — ONDANSETRON HCL 4 MG/2ML IJ SOLN
4.0000 mg | Freq: Four times a day (QID) | INTRAMUSCULAR | Status: DC | PRN
  Administered 2024-07-27 – 2024-08-03 (×4): 4 mg via INTRAVENOUS
  Filled 2024-07-26 (×4): qty 2

## 2024-07-26 MED ORDER — VANCOMYCIN HCL IN DEXTROSE 1-5 GM/200ML-% IV SOLN
1000.0000 mg | Freq: Once | INTRAVENOUS | Status: AC
  Administered 2024-07-26: 1000 mg via INTRAVENOUS
  Filled 2024-07-26: qty 200

## 2024-07-26 MED ORDER — ACETAMINOPHEN 325 MG PO TABS
650.0000 mg | ORAL_TABLET | Freq: Four times a day (QID) | ORAL | Status: DC | PRN
  Administered 2024-07-26 – 2024-07-30 (×3): 650 mg via ORAL
  Filled 2024-07-26 (×3): qty 2

## 2024-07-26 MED ORDER — LACTATED RINGERS IV BOLUS (SEPSIS)
250.0000 mL | Freq: Once | INTRAVENOUS | Status: AC
  Administered 2024-07-26: 250 mL via INTRAVENOUS

## 2024-07-26 MED ORDER — TRAZODONE HCL 50 MG PO TABS
25.0000 mg | ORAL_TABLET | Freq: Every evening | ORAL | Status: DC | PRN
  Administered 2024-07-27 – 2024-08-02 (×3): 25 mg via ORAL
  Filled 2024-07-26 (×3): qty 1

## 2024-07-26 MED ORDER — LACTATED RINGERS IV SOLN
INTRAVENOUS | Status: DC
Start: 2024-07-26 — End: 2024-07-27

## 2024-07-26 MED ORDER — INSULIN ASPART 100 UNIT/ML IJ SOLN: 0.0000 [IU] | Freq: Every day | INTRAMUSCULAR | Status: DC

## 2024-07-26 MED ORDER — PAROXETINE HCL 10 MG PO TABS
40.0000 mg | ORAL_TABLET | Freq: Every morning | ORAL | Status: DC
  Administered 2024-07-27 – 2024-08-03 (×8): 40 mg via ORAL
  Filled 2024-07-26: qty 4
  Filled 2024-07-26: qty 2
  Filled 2024-07-26: qty 4
  Filled 2024-07-26: qty 2
  Filled 2024-07-26: qty 4
  Filled 2024-07-26: qty 2
  Filled 2024-07-26: qty 4
  Filled 2024-07-26: qty 2
  Filled 2024-07-26: qty 4

## 2024-07-26 MED ORDER — METRONIDAZOLE 500 MG/100ML IV SOLN
500.0000 mg | Freq: Once | INTRAVENOUS | Status: AC
  Administered 2024-07-26: 500 mg via INTRAVENOUS
  Filled 2024-07-26: qty 100

## 2024-07-26 MED ORDER — ALBUTEROL SULFATE (2.5 MG/3ML) 0.083% IN NEBU: 2.5000 mg | INHALATION_SOLUTION | RESPIRATORY_TRACT | Status: DC | PRN

## 2024-07-26 MED ORDER — HYDROMORPHONE HCL 1 MG/ML IJ SOLN
0.5000 mg | Freq: Once | INTRAMUSCULAR | Status: AC
  Administered 2024-07-26: 0.5 mg via INTRAVENOUS
  Filled 2024-07-26: qty 1

## 2024-07-26 MED ORDER — SODIUM CHLORIDE 0.9 % IV SOLN
1.0000 g | Freq: Every day | INTRAVENOUS | Status: DC
  Administered 2024-07-26: 1 g via INTRAVENOUS
  Filled 2024-07-26: qty 10

## 2024-07-26 MED ORDER — SODIUM CHLORIDE 0.9 % IV BOLUS
1000.0000 mL | Freq: Once | INTRAVENOUS | Status: AC
  Administered 2024-07-26: 1000 mL via INTRAVENOUS

## 2024-07-26 MED ORDER — PRAVASTATIN SODIUM 20 MG PO TABS
40.0000 mg | ORAL_TABLET | Freq: Every day | ORAL | Status: DC
  Administered 2024-07-27 – 2024-08-03 (×8): 40 mg via ORAL
  Filled 2024-07-26 (×2): qty 2
  Filled 2024-07-26 (×2): qty 1
  Filled 2024-07-26: qty 2
  Filled 2024-07-26 (×2): qty 1
  Filled 2024-07-26: qty 2

## 2024-07-26 NOTE — ED Notes (Signed)
 Back from ct.

## 2024-07-26 NOTE — ED Provider Notes (Addendum)
 [place] [facility] Provider Note   CSN: [id number] Arrival date & time: 07/26/24  9293     Patient presents with: Near Syncope   Debra [name] is a 68 y.o. female.   Patient with multiple complaints.  Patient just keeps stating that she feels sick.  Difficult to get exactly what is bothering her.  Definitely complaining of some chest pain but not now and abdominal pain.  States there was some vomiting.  Denies diarrhea.  States there has been some cough.  Symptoms have been present for about 5 days.  Past medical history significant for mixed connective tissue disease degenerative disc disease osteoarthritis epilepsy as a child swallowing difficulty diastolic dysfunction hypertension sleep apnea peripheral vertigo hyperlipidemia restless leg syndrome type 2 diabetes mild cognitive impairment of uncertain etiology patient is followed by [facility] neurology.  Patient is an everyday smoker.  Patient lives by herself.       Prior to Admission medications   Medication Sig Start Date End Date Taking? Authorizing Provider  Accu-Chek Softclix Lancets lancets USE TO CHECK BLOOD GLUCOSE ONCE A DAY 06/19/23   Antonio Meth, Yvonne R, DO  ammonium lactate  (LAC-HYDRIN ) 12 % lotion Apply 1 Application topically as needed for dry skin. 12/02/23   Lowne Chase, Yvonne R, DO  azelastine  (ASTELIN ) 0.1 % nasal spray Place 2 sprays into both nostrils 2 (two) times daily. Use in each nostril as directed 07/29/23   Antonio Meth, Yvonne R, DO  Blood Glucose Monitoring Suppl DEVI 1 each by Does not apply route in the morning, at noon, and at bedtime. May substitute to any manufacturer covered by patient's insurance. 01/06/24   Antonio Meth Jamee JONELLE, DO  donepezil  (ARICEPT ) 5 MG tablet Take 1 tablet (5 mg total) by mouth daily. 01/13/24   Antonio Meth Jamee JONELLE, DO  fluticasone  (FLONASE ) 50 MCG/ACT nasal spray Place 2 sprays into both nostrils daily. 07/29/23   Antonio Meth Jamee R, DO   gabapentin  (NEURONTIN ) 600 MG tablet TAKE 1 TABLET BY MOUTH 3 TIMES  DAILY 11/17/23   Antonio Meth, Yvonne R, DO  hydrochlorothiazide  (HYDRODIURIL ) 25 MG tablet TAKE 1 TABLET BY MOUTH DAILY 12/08/23   Antonio Meth, Yvonne R, DO  HYDROcodone -acetaminophen  (NORCO/VICODIN) 5-325 MG tablet     [provider]  levothyroxine  (SYNTHROID ) 50 MCG tablet Take 1 tablet (50 mcg total) by mouth daily. 06/22/24   Antonio Meth Jamee JONELLE, DO  losartan  (COZAAR ) 100 MG tablet Take 1 tablet (100 mg total) by mouth daily. 06/22/24   Antonio Meth Jamee JONELLE, DO  metoprolol  succinate (TOPROL -XL) 100 MG 24 hr tablet Take 1 tablet (100 mg total) by mouth daily. Take with or immediately following a meal. 06/22/24   Antonio Meth, Jamee JONELLE, DO  montelukast  (SINGULAIR ) 10 MG tablet Take 1 tablet (10 mg total) by mouth at bedtime. 08/25/23   Antonio Meth Jamee JONELLE, DO  PARoxetine  (PAXIL ) 40 MG tablet Take 1 tablet (40 mg total) by mouth every morning. 07/29/23   Antonio Meth Jamee JONELLE, DO  pravastatin  (PRAVACHOL ) 40 MG tablet Take 1 tablet (40 mg total) by mouth daily. 06/22/24   Antonio Meth Jamee JONELLE, DO  predniSONE  (DELTASONE ) 10 MG tablet     [provider]  saccharomyces boulardii (FLORASTOR) 250 MG capsule Take 1 capsule (250 mg total) by mouth 2 (two) times daily. 12/02/23   Antonio Meth Jamee R, DO  Semaglutide , 1 MG/DOSE, (OZEMPIC , 1 MG/DOSE,) 4 MG/3ML SOPN Inject 1 mg into  the skin once a week. 07/22/24   Antonio Cyndee Jamee JONELLE, DO  traZODone  (DESYREL ) 50 MG tablet Take 0.5-1 tablets (25-50 mg total) by mouth at bedtime as needed for sleep. 01/13/24   Antonio Cyndee Jamee JONELLE, DO  trospium  (SANCTURA ) 20 MG tablet Take 1 tablet (20 mg total) by mouth 2 (two) times daily. 05/28/24   Marilynne Rosaline SAILOR, MD    Allergies: Penicillins and Codeine    Review of Systems  Updated Vital Signs BP 119/68   Pulse 100   Temp (!) 101.5 F (38.6 C) (Rectal)   Resp 17   SpO2 92%   Physical Exam Vitals and nursing note  reviewed.  Constitutional:      General: She is not in acute distress.    Appearance: Normal appearance. She is well-developed.  HENT:     Head: Normocephalic and atraumatic.     Mouth/Throat:     Mouth: Mucous membranes are dry.  Eyes:     Extraocular Movements: Extraocular movements intact.     Conjunctiva/sclera: Conjunctivae normal.  Cardiovascular:     Rate and Rhythm: Regular rhythm. Tachycardia present.     Heart sounds: No murmur heard. Pulmonary:     Effort: Pulmonary effort is normal. No respiratory distress.     Breath sounds: Normal breath sounds.  Abdominal:     General: There is no distension.     Palpations: Abdomen is soft.     Tenderness: There is abdominal tenderness.     Comments: Some diffuse abdominal tenderness without guarding.  Musculoskeletal:        General: No swelling.     Cervical back: Normal range of motion and neck supple. No rigidity.     Right lower leg: No edema.     Left lower leg: No edema.  Skin:    General: Skin is warm and dry.     Capillary Refill: Capillary refill takes less than 2 seconds.  Neurological:     General: No focal deficit present.     Mental Status: She is alert and oriented to person, place, and time.  Psychiatric:        Mood and Affect: Mood normal.     (all labs ordered are listed, but only abnormal results are displayed) Labs Reviewed  COMPREHENSIVE METABOLIC PANEL WITH GFR - Abnormal; Notable for the following components:      Result Value   Sodium 134 (*)    Potassium 3.4 (*)    Chloride 97 (*)    Glucose, Bld 153 (*)    BUN 24 (*)    Creatinine, Ser 1.36 (*)    GFR, Estimated 42 (*)    All other components within normal limits  CBC WITH DIFFERENTIAL/PLATELET - Abnormal; Notable for the following components:   WBC 19.3 (*)    RBC 3.75 (*)    Hemoglobin 10.8 (*)    HCT 31.4 (*)    Neutro Abs 16.5 (*)    Monocytes Absolute 1.5 (*)    Abs Immature Granulocytes 0.25 (*)    All other components within  normal limits  URINALYSIS, W/ REFLEX TO CULTURE (INFECTION SUSPECTED) - Abnormal; Notable for the following components:   APPearance HAZY (*)    Hgb urine dipstick LARGE (*)    Protein, ur 100 (*)    Nitrite POSITIVE (*)    Leukocytes,Ua MODERATE (*)    Bacteria, UA MANY (*)    All other components within normal limits  CBG MONITORING, ED - Abnormal; Notable  for the following components:   Glucose-Capillary 164 (*)    All other components within normal limits  RESP PANEL BY RT-PCR (RSV, FLU A&B, COVID)  RVPGX2  CULTURE, BLOOD (SINGLE)  URINE CULTURE  LACTIC ACID, PLASMA  LACTIC ACID, PLASMA  TROPONIN T, HIGH SENSITIVITY  TROPONIN T, HIGH SENSITIVITY    EKG: EKG Interpretation Date/Time:  Monday July 26 2024 07:27:19 EDT Ventricular Rate:  104 PR Interval:  175 QRS Duration:  94 QT Interval:  344 QTC Calculation: 453 R Axis:   20  Text Interpretation: Sinus tachycardia Abnormal R-wave progression, early transition No significant change since last tracing Confirmed by Morell Mears [phone]) on 07/26/2024 7:56:07 AM  Radiology: CT ABDOMEN PELVIS W CONTRAST Result Date: 07/26/2024 CLINICAL DATA:  68 year old female with altered mental status, lethargy, pain. EXAM: CT ABDOMEN AND PELVIS WITH CONTRAST TECHNIQUE: Multidetector CT imaging of the abdomen and pelvis was performed using the standard protocol following bolus administration of intravenous contrast. RADIATION DOSE REDUCTION: This exam was performed according to the departmental dose-optimization program which includes automated exposure control, adjustment of the mA and/or kV according to patient size and/or use of iterative reconstruction technique. CONTRAST:  80mL OMNIPAQUE  IOHEXOL  300 MG/ML  SOLN COMPARISON:  Abdomen ultrasound 09/03/2016. FINDINGS: Lower chest: Mild, symmetric dependent opacity at both lung bases most resembles atelectasis. No pleural effusion. No pericardial effusion. Borderline to mild cardiomegaly.  Hepatobiliary: Negative liver and gallbladder. Pancreas: Negative. Spleen: Negative. Adrenals/Urinary Tract: Normal adrenal glands. Left kidney and left ureter appear normal. But abnormal right renal enhancement, the right kidney appears inflamed, indistinct. Right pararenal space confluent edema or inflammation (series 2, image 38). Delayed and/or striated nephrogram on that side. Urothelial thickening of the proximal right ureter. No right intrarenal calculus. Right ureter is highly indistinct, inflamed throughout its course (series 7, image 53) but does not seem abnormally dilated. And multiple soft tissue calcifications at the level of the proximal right ureter (series 2, images 40-42) are favored to be gonadal vessel phleboliths. Multiple pelvic phleboliths also. No calculus within the bladder. Distal right ureter seems decompressed. Stomach/Bowel: Redundant large bowel, intermittently gas distended. Mild to moderate volume of retained stool also. No abnormally dilated large bowel loops. Normal appendix in the right lower quadrant series 7, image 50. No large bowel inflammation identified. Nondilated small bowel. Small fat containing umbilical hernia (series 2, image 40) with no inflammation. Decompressed stomach. No pneumoperitoneum. No free fluid. Vascular/Lymphatic: Fairly advanced Aortoiliac calcified atherosclerosis. Major arterial structures remain patent in the abdomen and pelvis despite atherosclerosis. Portal venous system is patent. No lymphadenopathy. Reproductive: Retroverted uterus with densely calcified fibroid measuring up to 8 cm diameter (sagittal image 113, series 2, image 68). Otherwise negative. Other: No pelvis free fluid. Musculoskeletal: Advanced lower thoracic endplate degeneration. Advanced lumbar facet degeneration. No acute or suspicious osseous lesion. IMPRESSION: 1. Abnormal Right kidney and ureter, constellation of findings which favor Ascending Urinary Infection and Acute  Pyelonephritis, rather than obstructive uropathy (although the latter difficult to completely exclude - suspect multiple right gonadal vein phleboliths are superimposed along the course of the right ureter). Correlation with urinalysis may be helpful. 2. No other acute or inflammatory process identified in the abdomen or pelvis. Normal appendix. 3.  Aortic Atherosclerosis (ICD10-I70.0). Electronically Signed   By: VEAR Hurst M.D.   On: 07/26/2024 10:45   CT Head Wo Contrast Result Date: 07/26/2024 CLINICAL DATA:  68 year old female with altered mental status, lethargy, pain. EXAM: CT HEAD WITHOUT CONTRAST TECHNIQUE: Contiguous axial images were  obtained from the base of the skull through the vertex without intravenous contrast. RADIATION DOSE REDUCTION: This exam was performed according to the departmental dose-optimization program which includes automated exposure control, adjustment of the mA and/or kV according to patient size and/or use of iterative reconstruction technique. COMPARISON:  Brain MRI 05/06/2023.  Head CT 08/15/2006. FINDINGS: Brain: Cerebral volume loss since the 2007 CT seems age-appropriate. No midline shift, ventriculomegaly, mass effect, evidence of mass lesion, intracranial hemorrhage or evidence of cortically based acute infarction. Patchy chronic white matter disease appears stable from the MRI last year. Maintained gray-white differentiation and no cortical encephalomalacia identified. Vascular: No suspicious intracranial vascular hyperdensity. Calcified atherosclerosis at the skull base. Skull: Partially visible extensive chronic suboccipital cervical posterior element surgery and fusion. Visible portion stable from 2007 head CT. No acute osseous abnormality identified. Sinuses/Orbits: Visualized paranasal sinuses and mastoids are stable and well aerated. Other: Postoperative changes to the left globe. No acute orbit or scalp soft tissue finding. IMPRESSION: 1. No acute intracranial  abnormality. 2. Chronic cerebral white matter disease appears stable from MRI last year. 3. Extensive chronic suboccipital cervical spine surgery, that visible grossly stable since 2007. Electronically Signed   By: VEAR Hurst M.D.   On: 07/26/2024 10:20   DG Chest Port 1 View Result Date: 07/26/2024 CLINICAL DATA:  Chest pain EXAM: PORTABLE CHEST 1 VIEW COMPARISON:  05/25/2019 FINDINGS: The lungs are clear without focal pneumonia, edema, pneumothorax or pleural effusion. The cardiopericardial silhouette is within normal limits for size. No acute bony abnormality. Telemetry leads overlie the chest. IMPRESSION: No active disease. Electronically Signed   By: Camellia Candle M.D.   On: 07/26/2024 08:20     Procedures   Medications Ordered in the ED  lactated ringers infusion (has no administration in time range)  sodium chloride  0.9 % bolus 500 mL (0 mLs Intravenous Stopped 07/26/24 0917)  acetaminophen  (TYLENOL ) tablet 650 mg (650 mg Oral Given 07/26/24 0914)  lactated ringers bolus 1,000 mL (0 mLs Intravenous Stopped 07/26/24 1158)    And  lactated ringers bolus 1,000 mL (0 mLs Intravenous Stopped 07/26/24 1159)    And  lactated ringers bolus 250 mL (250 mLs Intravenous New Bag/Given 07/26/24 1116)  aztreonam (AZACTAM) 2 g in sodium chloride  0.9 % 100 mL IVPB (0 g Intravenous Stopped 07/26/24 1159)  metroNIDAZOLE  (FLAGYL ) IVPB 500 mg (0 mg Intravenous Stopped 07/26/24 1158)  vancomycin (VANCOCIN) IVPB 1000 mg/200 mL premix (0 mg Intravenous Stopped 07/26/24 1217)  iohexol  (OMNIPAQUE ) 300 MG/ML solution 100 mL (80 mLs Intravenous Contrast Given 07/26/24 0945)                                    Medical Decision Making Amount and/or Complexity of Data Reviewed Labs: ordered. Radiology: ordered.  Risk OTC drugs. Prescription drug management. Decision regarding hospitalization.   Patient with multiple complaints to include chest pain and abdominal pain.  Patient overall just saying she does not  feel well.  Vital signs fairly reassuring.  Apparently some concerns for gait abnormality and feeling like she is going to pass out.  White blood cell count 19.9 hemoglobin 10.8 platelets 177.  Fingerstick blood sugar 164.  Lactic acid ordered and is pending.  This may be sepsis.  Will start with chest x-ray will probably end up needing maybe CT head and CT abdomen pelvis.  Patient vital signs currently not indicative of sepsis.  Patient's head CT  negative for any acute findings.  CT abdomen and pelvis seems to be consistent with right-sided pyelonephritis.  Patient's initial troponin was normal less than 15 and initial lactic acid was 1.5 despite high white count.  As mentioned we did treat her for sepsis once that fever was 101.5 rectally.  And with a high white count.  So she was treated with broad-spectrum antibiotics and fluids.  Blood pressures have stabilized out systolics are 100 or better now.  I think that the pyelonephritis is the main problem we did In-N-Out catheter for urine.  So that is pending.  Respiratory panel was negative.  Blood cultures were sent urine culture was sent and is pending.  CRITICAL CARE Performed by: Latria Mccarron Total critical care time: 45 minutes Critical care time was exclusive of separately billable procedures and treating other patients. Critical care was necessary to treat or prevent imminent or life-threatening deterioration. Critical care was time spent personally by me on the following activities: development of treatment plan with patient and/or surrogate as well as nursing, discussions with consultants, evaluation of patient's response to treatment, examination of patient, obtaining history from patient or surrogate, ordering and performing treatments and interventions, ordering and review of laboratory studies, ordering and review of radiographic studies, pulse oximetry and re-evaluation of patient's condition.   Final diagnoses:   Pyelonephritis  Sepsis, due to unspecified organism, unspecified whether acute organ dysfunction present [facility])  AKI (acute kidney injury)    ED Discharge Orders     None          Geraldene [name], MD 07/26/24 9245    Geraldene [name], MD 07/26/24 1221    Geraldene [name], MD 07/26/24 1251

## 2024-07-26 NOTE — ED Notes (Addendum)
 Pt had not voided and felt like she could not do it , pt straight cath for 150cc cloudy yellow urine UA to lab 224 om bladder scan

## 2024-07-26 NOTE — Progress Notes (Signed)
 Taken by NT [facility]

## 2024-07-26 NOTE — H&P (Signed)
 History and Physical  Debra [name] FMW:[id number] DOB: 05/25/1956 DOA: 07/26/2024  PCP: Antonio Cyndee Jamee JONELLE, DO   Chief Complaint: Feeling sick  HPI: Debra [name] is a 68 y.o. female with medical history significant for OSA, diastolic dysfunction, hypertension, type 2 diabetes being admitted to the hospital with severe sepsis due to pyelonephritis.  Patient presented to the emergency department early this morning with complaints of feeling sick in general, apparently she complained of some chest hide and later abdominal pain, associated vomiting.  Denies diarrhea.  Workup in the emergency department as detailed below shows evidence of pyelonephritis.  She was started on empiric IV antibiotics and admitted to the hospitalist service.  On arrival to [facility], the patient is somnolent, tachycardic and hypotensive.  She is protecting her airway, wakes up to verbal stimulus and answers questions appropriately but falls asleep once again.  She had a head CT in the ER, because she mentions some gait abnormality and sensation of presyncope.  Review of Systems: Please see HPI for pertinent positives and negatives. A complete 10 system review of systems are otherwise negative.  Past Medical History:  Diagnosis Date   Abdominal pain, right upper quadrant 04/30/2007   Allergies 02/03/2019   Anemia, iron deficiency 04/17/2017   Aortic atherosclerosis 10/10/2017   Atypical chest pain 08/31/2014   Back pain with radiculopathy 01/25/2010   Check xray --- ? Cause of leg weakness   Chronic pain of both shoulders 10/31/2020   Cognitive deficits 2022   Constipation    DDD (degenerative disc disease)    Diastolic dysfunction 02/15/2010   Epilepsy    Childhood seziures; pt reports that she grew out of them and they have not occurred since childhood   Essential hypertension 08/31/2010   Poorly controlled will alter medications, encouraged DASH diet, minimize caffeine and obtain adequate sleep.  Report concerning symptoms and follow up as directed and as needed [employer] metoprolol  Edema may be c   Expressive language impairment 04/13/2024   Fatigue 01/25/2010   Generalized anxiety disorder 04/22/2018   Stable co'nt meds   GERD (gastroesophageal reflux disease) 07/16/2007   Hearing loss 07/24/2012   Refer to hearing clinic   Hiatal hernia 07/16/2007   Hyperglycemia, fasting 05/04/2010   Hyperlipidemia 04/17/2017   Encouraged heart healthy diet, increase exercise, avoid trans fats, consider a krill oil cap daily   Hypothyroidism 11/02/2010   con't meds; Lab Results Component Value TSH 1.44 08/12/2017   Insomnia 04/30/2007   Knee pain 07/16/2007   Leg pain, bilateral 11/17/2008   Low back pain 07/16/2007   Major depressive disorder 01/30/2018   con't meds F/u counselor   Mild cognitive impairment of uncertain or unknown etiology 01/30/2021   Mixed connective tissue disease    with Raynaud's   Obstructive sleep apnea 01/30/2018   F/u with pulm; May be cause of some of her memory/concentration problems   Osteoarthritis    Osteoporosis    Palpitations 02/12/2008   Pansinusitis 01/30/2018   Peripheral vertigo 04/30/2007   Resolved rto prn   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   Plantar wart 05/06/2008   Restless legs syndrome 12/01/2008   Stomach ulcer    Swallowing difficulty    Type 2 diabetes mellitus with hyperglycemia, without long-term current use of insulin  08/09/2022   Upper respiratory tract infection 12/14/2019   Urge incontinence of urine 10/06/2020   Vaginal discharge 05/25/2021   Vitamin D  deficiency    Wound infection 10/04/2021   Wound of right  ankle 09/27/2021   Past Surgical History:  Procedure Laterality Date   CARPAL TUNNEL RELEASE     Bilateral   CERVICAL SPINE SURGERY     x3   CESAREAN SECTION     x3   LEG SURGERY     Right    WRIST FRACTURE SURGERY  10/02/2015   Pt have surgery twice on the same wrist.   Social History:  reports that  she has been smoking cigarettes. She has a 33 pack-year smoking history. She has never used smokeless tobacco. She reports current alcohol use. She reports current drug use. Drug: Methylphenidate.  Allergies  Allergen Reactions   Penicillins    Codeine Rash and Other (See Comments)    dizziness    Family History  Problem Relation Age of Onset   Breast cancer Mother        possibly 49's   Arthritis Mother        rheumatoid   Cancer Mother        breast   Heart disease Mother 6       MI   Hyperlipidemia Mother    Thyroid  disease Mother    Depression Mother    Anxiety disorder Mother    Heart disease Father        MI   Hypertension Father    Stroke Father    Diabetes Sister    Hypertension Sister    Hyperlipidemia Sister    Memory loss Maternal Aunt    Colon cancer Maternal Grandfather    Esophageal cancer Neg Hx    Rectal cancer Neg Hx    Stomach cancer Neg Hx      Prior to Admission medications   Medication Sig Start Date End Date Taking? Authorizing Provider  Accu-Chek Softclix Lancets lancets USE TO CHECK BLOOD GLUCOSE ONCE A DAY 06/19/23   Antonio Meth, Yvonne R, DO  ammonium lactate  (LAC-HYDRIN ) 12 % lotion Apply 1 Application topically as needed for dry skin. 12/02/23   Lowne Chase, Yvonne R, DO  azelastine  (ASTELIN ) 0.1 % nasal spray Place 2 sprays into both nostrils 2 (two) times daily. Use in each nostril as directed 07/29/23   Antonio Meth, Yvonne R, DO  Blood Glucose Monitoring Suppl DEVI 1 each by Does not apply route in the morning, at noon, and at bedtime. May substitute to any manufacturer covered by patient's insurance. 01/06/24   Antonio Meth Jamee JONELLE, DO  donepezil  (ARICEPT ) 5 MG tablet Take 1 tablet (5 mg total) by mouth daily. 01/13/24   Antonio Meth Jamee R, DO  fluticasone  (FLONASE ) 50 MCG/ACT nasal spray Place 2 sprays into both nostrils daily. 07/29/23   Antonio Meth Jamee R, DO  gabapentin  (NEURONTIN ) 600 MG tablet TAKE 1 TABLET BY MOUTH 3 TIMES  DAILY  11/17/23   Antonio Meth, Yvonne R, DO  hydrochlorothiazide  (HYDRODIURIL ) 25 MG tablet TAKE 1 TABLET BY MOUTH DAILY 12/08/23   Antonio Meth, Jamee JONELLE, DO  HYDROcodone -acetaminophen  (NORCO/VICODIN) 5-325 MG tablet     [provider]  levothyroxine  (SYNTHROID ) 50 MCG tablet Take 1 tablet (50 mcg total) by mouth daily. 06/22/24   Antonio Meth Jamee JONELLE, DO  losartan  (COZAAR ) 100 MG tablet Take 1 tablet (100 mg total) by mouth daily. 06/22/24   Antonio Meth Jamee JONELLE, DO  metoprolol  succinate (TOPROL -XL) 100 MG 24 hr tablet Take 1 tablet (100 mg total) by mouth daily. Take with or immediately following a meal. 06/22/24   Antonio Meth, Yvonne R, DO  montelukast  (SINGULAIR )  10 MG tablet Take 1 tablet (10 mg total) by mouth at bedtime. 08/25/23   Antonio Cyndee Jamee JONELLE, DO  PARoxetine  (PAXIL ) 40 MG tablet Take 1 tablet (40 mg total) by mouth every morning. 07/29/23   Antonio Cyndee Jamee JONELLE, DO  pravastatin  (PRAVACHOL ) 40 MG tablet Take 1 tablet (40 mg total) by mouth daily. 06/22/24   Antonio Cyndee Jamee JONELLE, DO  predniSONE  (DELTASONE ) 10 MG tablet     [provider]  saccharomyces boulardii (FLORASTOR) 250 MG capsule Take 1 capsule (250 mg total) by mouth 2 (two) times daily. 12/02/23   Antonio Cyndee Jamee R, DO  Semaglutide , 1 MG/DOSE, (OZEMPIC , 1 MG/DOSE,) 4 MG/3ML SOPN Inject 1 mg into the skin once a week. 07/22/24   Antonio Cyndee Jamee JONELLE, DO  traZODone  (DESYREL ) 50 MG tablet Take 0.5-1 tablets (25-50 mg total) by mouth at bedtime as needed for sleep. 01/13/24   Antonio Cyndee Jamee JONELLE, DO  trospium  (SANCTURA ) 20 MG tablet Take 1 tablet (20 mg total) by mouth 2 (two) times daily. 05/28/24   Marilynne Rosaline SAILOR, MD    Physical Exam: BP (!) 85/64 (BP Location: Left Arm)   Pulse (!) 110   Temp 98.6 F (37 C) (Oral)   Resp (!) 26   SpO2 97%  General: Patient is somnolent, but arousable, oriented x 4.  Not diaphoretic, not in acute distress. Cardiovascular: RRR, no murmurs or rubs, no peripheral edema   Respiratory: clear to auscultation bilaterally, no wheezes, no crackles  Abdomen: soft, nontender, nondistended, normal bowel tones heard  Skin: dry, no rashes  Musculoskeletal: no joint effusions, normal range of motion  Psychiatric: appropriate affect, normal speech  Neurologic: extraocular muscles intact, clear speech, moving all extremities with intact sensorium         Labs on Admission:  Basic Metabolic Panel: Recent Labs  Lab 07/26/24 0729  NA 134*  K 3.4*  CL 97*  CO2 22  GLUCOSE 153*  BUN 24*  CREATININE 1.36*  CALCIUM  9.5   Liver Function Tests: Recent Labs  Lab 07/26/24 0729  AST 28  ALT 16  ALKPHOS 100  BILITOT 1.1  PROT 7.7  ALBUMIN 3.8   No results for input(s): LIPASE, AMYLASE in the last 168 hours. No results for input(s): AMMONIA in the last 168 hours. CBC: Recent Labs  Lab 07/26/24 0729  WBC 19.3*  NEUTROABS 16.5*  HGB 10.8*  HCT 31.4*  MCV 83.7  PLT 177   Cardiac Enzymes: No results for input(s): CKTOTAL, CKMB, CKMBINDEX, TROPONINI in the last 168 hours. BNP (last 3 results) No results for input(s): BNP in the last 8760 hours.  ProBNP (last 3 results) No results for input(s): PROBNP in the last 8760 hours.  CBG: Recent Labs  Lab 07/26/24 0723  GLUCAP 164*    Radiological Exams on Admission: CT ABDOMEN PELVIS W CONTRAST Result Date: 07/26/2024 CLINICAL DATA:  68 year old female with altered mental status, lethargy, pain. EXAM: CT ABDOMEN AND PELVIS WITH CONTRAST TECHNIQUE: Multidetector CT imaging of the abdomen and pelvis was performed using the standard protocol following bolus administration of intravenous contrast. RADIATION DOSE REDUCTION: This exam was performed according to the departmental dose-optimization program which includes automated exposure control, adjustment of the mA and/or kV according to patient size and/or use of iterative reconstruction technique. CONTRAST:  80mL OMNIPAQUE  IOHEXOL  300 MG/ML   SOLN COMPARISON:  Abdomen ultrasound 09/03/2016. FINDINGS: Lower chest: Mild, symmetric dependent opacity at both lung bases most resembles atelectasis. No pleural effusion.  No pericardial effusion. Borderline to mild cardiomegaly. Hepatobiliary: Negative liver and gallbladder. Pancreas: Negative. Spleen: Negative. Adrenals/Urinary Tract: Normal adrenal glands. Left kidney and left ureter appear normal. But abnormal right renal enhancement, the right kidney appears inflamed, indistinct. Right pararenal space confluent edema or inflammation (series 2, image 38). Delayed and/or striated nephrogram on that side. Urothelial thickening of the proximal right ureter. No right intrarenal calculus. Right ureter is highly indistinct, inflamed throughout its course (series 7, image 53) but does not seem abnormally dilated. And multiple soft tissue calcifications at the level of the proximal right ureter (series 2, images 40-42) are favored to be gonadal vessel phleboliths. Multiple pelvic phleboliths also. No calculus within the bladder. Distal right ureter seems decompressed. Stomach/Bowel: Redundant large bowel, intermittently gas distended. Mild to moderate volume of retained stool also. No abnormally dilated large bowel loops. Normal appendix in the right lower quadrant series 7, image 50. No large bowel inflammation identified. Nondilated small bowel. Small fat containing umbilical hernia (series 2, image 40) with no inflammation. Decompressed stomach. No pneumoperitoneum. No free fluid. Vascular/Lymphatic: Fairly advanced Aortoiliac calcified atherosclerosis. Major arterial structures remain patent in the abdomen and pelvis despite atherosclerosis. Portal venous system is patent. No lymphadenopathy. Reproductive: Retroverted uterus with densely calcified fibroid measuring up to 8 cm diameter (sagittal image 113, series 2, image 68). Otherwise negative. Other: No pelvis free fluid. Musculoskeletal: Advanced lower thoracic  endplate degeneration. Advanced lumbar facet degeneration. No acute or suspicious osseous lesion. IMPRESSION: 1. Abnormal Right kidney and ureter, constellation of findings which favor Ascending Urinary Infection and Acute Pyelonephritis, rather than obstructive uropathy (although the latter difficult to completely exclude - suspect multiple right gonadal vein phleboliths are superimposed along the course of the right ureter). Correlation with urinalysis may be helpful. 2. No other acute or inflammatory process identified in the abdomen or pelvis. Normal appendix. 3.  Aortic Atherosclerosis (ICD10-I70.0). Electronically Signed   By: VEAR Hurst M.D.   On: 07/26/2024 10:45   CT Head Wo Contrast Result Date: 07/26/2024 CLINICAL DATA:  68 year old female with altered mental status, lethargy, pain. EXAM: CT HEAD WITHOUT CONTRAST TECHNIQUE: Contiguous axial images were obtained from the base of the skull through the vertex without intravenous contrast. RADIATION DOSE REDUCTION: This exam was performed according to the departmental dose-optimization program which includes automated exposure control, adjustment of the mA and/or kV according to patient size and/or use of iterative reconstruction technique. COMPARISON:  Brain MRI 05/06/2023.  Head CT 08/15/2006. FINDINGS: Brain: Cerebral volume loss since the 2007 CT seems age-appropriate. No midline shift, ventriculomegaly, mass effect, evidence of mass lesion, intracranial hemorrhage or evidence of cortically based acute infarction. Patchy chronic white matter disease appears stable from the MRI last year. Maintained gray-white differentiation and no cortical encephalomalacia identified. Vascular: No suspicious intracranial vascular hyperdensity. Calcified atherosclerosis at the skull base. Skull: Partially visible extensive chronic suboccipital cervical posterior element surgery and fusion. Visible portion stable from 2007 head CT. No acute osseous abnormality identified.  Sinuses/Orbits: Visualized paranasal sinuses and mastoids are stable and well aerated. Other: Postoperative changes to the left globe. No acute orbit or scalp soft tissue finding. IMPRESSION: 1. No acute intracranial abnormality. 2. Chronic cerebral white matter disease appears stable from MRI last year. 3. Extensive chronic suboccipital cervical spine surgery, that visible grossly stable since 2007. Electronically Signed   By: VEAR Hurst M.D.   On: 07/26/2024 10:20   DG Chest Port 1 View Result Date: 07/26/2024 CLINICAL DATA:  Chest pain EXAM: PORTABLE CHEST  1 VIEW COMPARISON:  05/25/2019 FINDINGS: The lungs are clear without focal pneumonia, edema, pneumothorax or pleural effusion. The cardiopericardial silhouette is within normal limits for size. No acute bony abnormality. Telemetry leads overlie the chest. IMPRESSION: No active disease. Electronically Signed   By: Camellia Candle M.D.   On: 07/26/2024 08:20   Assessment/Plan Debra [name] is a 68 y.o. female with medical history significant for OSA, diastolic dysfunction, hypertension, type 2 diabetes being admitted to the hospital with severe sepsis due to pyelonephritis.   Severe sepsis-meeting criteria with fever, leukocytosis, tachycardia, endorgan dysfunction with AKI.  Initial lactic acid unremarkable.  However patient is currently hypotensive again.  She received broad-spectrum empiric antibiotics in the emergency department. -Inpatient admission -Monitor closely on progressive -Give 1 L normal saline bolus x 1 now, and check stat lactic acid -Continue LR at 125 cc/h thereafter -Empiric IV Rocephin -Follow-up blood and urine cultures  Type 2 diabetes-well-controlled, with hemoglobin A1c August 2025 of 6.5 -Carb modified diet -Moderate dose sliding scale  Acute kidney injury-superimposed on baseline normal renal function, due to sepsis -Hydrate aggressively as above -Avoid nephrotoxins, hypotension, etc. -Follow renal function with  daily labs  Hypertension-hold home antihypertensives in the setting of sepsis and hypotension  Leukocytosis-due to sepsis, being treated as above  Chronic normocytic anemia-appears to be at baseline  DVT prophylaxis: Lovenox     Code Status: Full Code  Consults called: None  Admission status: The appropriate patient status for this patient is INPATIENT. Inpatient status is judged to be reasonable and necessary in order to provide the required intensity of service to ensure the patient's safety. The patient's presenting symptoms, physical exam findings, and initial radiographic and laboratory data in the context of their chronic comorbidities is felt to place them at high risk for further clinical deterioration. Furthermore, it is not anticipated that the patient will be medically stable for discharge from the hospital within 2 midnights of admission.    I certify that at the point of admission it is my clinical judgment that the patient will require inpatient hospital care spanning beyond 2 midnights from the point of admission due to high intensity of service, high risk for further deterioration and high frequency of surveillance required  Time spent: 56 minutes  Adanely Reynoso CHRISTELLA Gail MD [facility] Pager [phone]  If 7PM-7AM, please contact night-coverage [email or web address] Password [email or web address]  07/26/2024, 4:28 PM

## 2024-07-26 NOTE — Progress Notes (Signed)
 68-Year-Old female presents with 5 days of not feeling well. Generally, found to have fever and evidence of sepsis due to pyelonephritis. Given broad-spector antibiotics. Accepted inpatient admission to progressive.

## 2024-07-26 NOTE — ED Notes (Signed)
 Called [facility] for transfer to [facility] @14 :18.   Spoke with Nataya.

## 2024-07-26 NOTE — ED Triage Notes (Signed)
 Lethargic and pain all over her body  x 5 days , nears syncope today . Had chest pain a few days ago yet denies it today , some  coughing . No fever .

## 2024-07-26 NOTE — Progress Notes (Signed)
   07/26/24 2030  BiPAP/CPAP/SIPAP  Reason BIPAP/CPAP not in use Non-compliant (Patient states she doesn't use it at home and does not want to here)

## 2024-07-26 NOTE — Plan of Care (Signed)
   Problem: Clinical Measurements: Goal: Ability to maintain clinical measurements within normal limits will improve Outcome: Progressing Goal: Will remain free from infection Outcome: Progressing Goal: Diagnostic test results will improve Outcome: Progressing Goal: Respiratory complications will improve Outcome: Progressing

## 2024-07-26 NOTE — ED Notes (Signed)
 tO CT  family at bedside

## 2024-07-26 NOTE — ED Notes (Signed)
Recliner in patients room for family member

## 2024-07-26 NOTE — Sepsis Progress Note (Signed)
 Code Sepsis protocol being monitored by [facility].

## 2024-07-26 NOTE — ED Notes (Signed)
 Report called over to [facility] and given to [employer]

## 2024-07-27 DIAGNOSIS — N12 Tubulo-interstitial nephritis, not specified as acute or chronic: Secondary | ICD-10-CM | POA: Diagnosis not present

## 2024-07-27 LAB — BASIC METABOLIC PANEL WITH GFR
Anion gap: 12 (ref 5–15)
BUN: 24 mg/dL — ABNORMAL HIGH (ref 8–23)
CO2: 21 mmol/L — ABNORMAL LOW (ref 22–32)
Calcium: 8.6 mg/dL — ABNORMAL LOW (ref 8.9–10.3)
Chloride: 105 mmol/L (ref 98–111)
Creatinine, Ser: 1.03 mg/dL — ABNORMAL HIGH (ref 0.44–1.00)
GFR, Estimated: 59 mL/min — ABNORMAL LOW (ref >60)
Glucose, Bld: 120 mg/dL — ABNORMAL HIGH (ref 70–99)
Potassium: 3 mmol/L — ABNORMAL LOW (ref 3.5–5.1)
Sodium: 138 mmol/L (ref 135–145)

## 2024-07-27 LAB — GLUCOSE, CAPILLARY
Glucose-Capillary: 118 mg/dL — ABNORMAL HIGH (ref 70–99)
Glucose-Capillary: 140 mg/dL — ABNORMAL HIGH (ref 70–99)
Glucose-Capillary: 61 mg/dL — ABNORMAL LOW (ref 70–99)
Glucose-Capillary: 61 mg/dL — ABNORMAL LOW (ref 70–99)
Glucose-Capillary: 134 mg/dL — ABNORMAL HIGH (ref 70–99)
Glucose-Capillary: 139 mg/dL — ABNORMAL HIGH (ref 70–99)

## 2024-07-27 LAB — BLOOD CULTURE ID PANEL (REFLEXED) - BCID2

## 2024-07-27 LAB — CBC
HCT: 25.8 % — ABNORMAL LOW (ref 36.0–46.0)
Hemoglobin: 8.5 g/dL — ABNORMAL LOW (ref 12.0–15.0)
MCH: 28.4 pg (ref 26.0–34.0)
MCHC: 32.9 g/dL (ref 30.0–36.0)
MCV: 86.3 fL (ref 80.0–100.0)
Platelets: 148 K/uL — ABNORMAL LOW (ref 150–400)
RBC: 2.99 MIL/uL — ABNORMAL LOW (ref 3.87–5.11)
RDW: 13.6 % (ref 11.5–15.5)
WBC: 19.1 K/uL — ABNORMAL HIGH (ref 4.0–10.5)
nRBC: 0 % (ref 0.0–0.2)

## 2024-07-27 LAB — TROPONIN T, HIGH SENSITIVITY: Troponin T High Sensitivity: 16 ng/L (ref 0–19)

## 2024-07-27 LAB — HIV ANTIBODY (ROUTINE TESTING W REFLEX): HIV Screen 4th Generation wRfx: NONREACTIVE

## 2024-07-27 LAB — MAGNESIUM: Magnesium: 1.4 mg/dL — ABNORMAL LOW (ref 1.7–2.4)

## 2024-07-27 MED ORDER — SODIUM CHLORIDE 0.9 % IV SOLN
1.0000 g | Freq: Once | INTRAVENOUS | Status: AC
  Administered 2024-07-27: 1 g via INTRAVENOUS
  Filled 2024-07-27: qty 10

## 2024-07-27 MED ORDER — MAGNESIUM SULFATE 2 GM/50ML IV SOLN
2.0000 g | Freq: Once | INTRAVENOUS | Status: AC
  Administered 2024-07-27: 2 g via INTRAVENOUS
  Filled 2024-07-27: qty 50

## 2024-07-27 MED ORDER — POTASSIUM CHLORIDE CRYS ER 20 MEQ PO TBCR
30.0000 meq | EXTENDED_RELEASE_TABLET | ORAL | Status: DC
  Administered 2024-07-27: 30 meq via ORAL
  Filled 2024-07-27: qty 1

## 2024-07-27 MED ORDER — SODIUM CHLORIDE 0.9 % IV SOLN
2.0000 g | INTRAVENOUS | Status: DC
  Administered 2024-07-27 – 2024-07-28 (×2): 2 g via INTRAVENOUS
  Filled 2024-07-27 (×2): qty 20

## 2024-07-27 MED ORDER — POTASSIUM CHLORIDE CRYS ER 20 MEQ PO TBCR
30.0000 meq | EXTENDED_RELEASE_TABLET | ORAL | Status: AC
  Administered 2024-07-27 (×2): 30 meq via ORAL
  Filled 2024-07-27 (×2): qty 1

## 2024-07-27 MED ORDER — HYDRALAZINE HCL 10 MG PO TABS: 10.0000 mg | ORAL_TABLET | Freq: Three times a day (TID) | ORAL | Status: DC | PRN

## 2024-07-27 MED ORDER — HYDROMORPHONE HCL 1 MG/ML IJ SOLN
0.5000 mg | INTRAMUSCULAR | Status: AC
  Administered 2024-07-27: 0.5 mg via INTRAVENOUS
  Filled 2024-07-27: qty 0.5

## 2024-07-27 MED ORDER — OXYCODONE HCL 5 MG PO TABS
5.0000 mg | ORAL_TABLET | Freq: Four times a day (QID) | ORAL | Status: AC | PRN
  Administered 2024-07-28 (×2): 5 mg via ORAL
  Filled 2024-07-27 (×2): qty 1

## 2024-07-27 MED ORDER — INSULIN ASPART 100 UNIT/ML IJ SOLN
0.0000 [IU] | Freq: Three times a day (TID) | INTRAMUSCULAR | Status: DC
  Administered 2024-07-28 – 2024-07-31 (×3): 1 [IU] via SUBCUTANEOUS

## 2024-07-27 NOTE — Progress Notes (Signed)
 PT Cancellation Note  Patient Details Name: Debra [name] MRN: [id number] DOB: 25-May-1956   Cancelled Treatment:    Reason Eval/Treat Not Completed: Other (comment)  Checked on pt who was lethargic but wanting to work with therapy after getting cleaned up.  Reports urinated and needs to be washed up - notified NT.  Checked after next pt and NT still with pt.  Later received message that pt ready - returned 12 mins after message and pt reports too fatigued and wiped out.  States she is upset/disappointed b/c she was looking forward to walking but is just too tired now.  Will f/u tomorrow.  Paidyn Mcferran, PT Acute Rehab [facility] [phone]  Benjiman VEAR Mulberry 07/27/2024, 4:43 PM

## 2024-07-27 NOTE — Progress Notes (Signed)
 PHARMACY - PHYSICIAN COMMUNICATION CRITICAL VALUE ALERT - BLOOD CULTURE IDENTIFICATION (BCID)  Debra [name] is an 68 y.o. female who presented to [facility] on 07/26/2024 with a chief complaint of feeling sick.   Assessment:  BCID 2 bottles = 1 set >> E coli, no resistance.  Name of physician (or Provider) ContactedBETHA KYM Hurst via secure chat  Current antibiotics: ceftriaxone 1 gm  Changes to prescribed antibiotics recommended:  Give extra 1 gm gm of Rocephin now & increasing Rocephin to 2 gm daily.  Results for orders placed or performed during the hospital encounter of 07/26/24  Blood Culture ID Panel (Reflexed) (Collected: 07/26/2024  9:39 AM)  Result Value Ref Range   Enterococcus faecalis NOT DETECTED NOT DETECTED   Enterococcus Faecium NOT DETECTED NOT DETECTED   Listeria monocytogenes NOT DETECTED NOT DETECTED   Staphylococcus species NOT DETECTED NOT DETECTED   Staphylococcus aureus (BCID) NOT DETECTED NOT DETECTED   Staphylococcus epidermidis NOT DETECTED NOT DETECTED   Staphylococcus lugdunensis NOT DETECTED NOT DETECTED   Streptococcus species NOT DETECTED NOT DETECTED   Streptococcus agalactiae NOT DETECTED NOT DETECTED   Streptococcus pneumoniae NOT DETECTED NOT DETECTED   Streptococcus pyogenes NOT DETECTED NOT DETECTED   A.calcoaceticus-baumannii NOT DETECTED NOT DETECTED   Bacteroides fragilis NOT DETECTED NOT DETECTED   Enterobacterales DETECTED (A) NOT DETECTED   Enterobacter cloacae complex NOT DETECTED NOT DETECTED   Escherichia coli DETECTED (A) NOT DETECTED   Klebsiella aerogenes NOT DETECTED NOT DETECTED   Klebsiella oxytoca NOT DETECTED NOT DETECTED   Klebsiella pneumoniae NOT DETECTED NOT DETECTED   Proteus species NOT DETECTED NOT DETECTED   Salmonella species NOT DETECTED NOT DETECTED   Serratia marcescens NOT DETECTED NOT DETECTED   Haemophilus influenzae NOT DETECTED NOT DETECTED   Neisseria meningitidis NOT DETECTED NOT DETECTED   Pseudomonas  aeruginosa NOT DETECTED NOT DETECTED   Stenotrophomonas maltophilia NOT DETECTED NOT DETECTED   Candida albicans NOT DETECTED NOT DETECTED   Candida auris NOT DETECTED NOT DETECTED   Candida glabrata NOT DETECTED NOT DETECTED   Candida krusei NOT DETECTED NOT DETECTED   Candida parapsilosis NOT DETECTED NOT DETECTED   Candida tropicalis NOT DETECTED NOT DETECTED   Cryptococcus neoformans/gattii NOT DETECTED NOT DETECTED   CTX-M ESBL NOT DETECTED NOT DETECTED   Carbapenem resistance IMP NOT DETECTED NOT DETECTED   Carbapenem resistance KPC NOT DETECTED NOT DETECTED   Carbapenem resistance NDM NOT DETECTED NOT DETECTED   Carbapenem resist OXA 48 LIKE NOT DETECTED NOT DETECTED   Carbapenem resistance VIM NOT DETECTED NOT DETECTED   Rosaline IVAR Edison, Pharm.D Use secure chat for questions 07/27/2024 12:47 AM

## 2024-07-27 NOTE — Progress Notes (Signed)
 Hypoglycemic Event  CBG: 61  Treatment: 4 oz juice/soda  Symptoms: None  Follow-up CBG: Time:1748 CBG Result:134  Possible Reasons for Event: Inadequate meal intake  Comments/MD notified:Dr. [place], Md    [name]

## 2024-07-27 NOTE — Plan of Care (Signed)

## 2024-07-27 NOTE — Progress Notes (Signed)
 PROGRESS NOTE    Debra [name]  FMW:[id number] DOB: 01-15-1956 DOA: 07/26/2024 PCP: Antonio Cyndee Jamee JONELLE, DO    Brief Narrative:   Debra [name] is a 68 y.o. female with past medical history significant for chronic diastolic congestive heart failure, HTN, DMT2, major depressive disorder, generalized anxiety disorder, hypothyroidism, OSA, cognitive impairment who presented to [facility] ED on 07/26/2024 with generalized pain, nausea/vomiting, lethargy and near syncope.  In the ED, temperature 101.5 F rectally, HR 103, RR 22, BP 91/61, SpO2 94% on room air.  WBC 19.3, hemoglobin 10.8, platelet count 177.  Sodium 134, potassium 3.4, chloride 97, CO2 22, glucose 153, BUN 24, creat 1.36.  AST 28, ALT 16, total bilirubin 1.1.  High-sensitivity troponin less than 15.  Lactic acid 1.5.  COVID/influenza/RSV PCR negative.  Urinalysis with moderate leukocytes, positive nitrite, many bacteria, greater than 50 WBCs.  Assessment & Plan:   Acute metabolic encephalopathy, POA: Improving Patient presenting with confusion, lethargy in the setting of fever, elevated WBC count, urinalysis consistent with urinary tract infection and abdominal imaging consistent with right-sided pyelonephritis.  CT head without contrast with no acute intracranial abnormality, noted chronic cerebral white matter disease stable from MRI prior year, extensive chronic suboccipital cervical spine surgery that is grossly stable since 2007. -- Supportive care, treatment as below  Severe sepsis, POA E. coli bacteremia Urinary tract infection Right pyelonephritis Patient presenting to ED with confusion, generalized pain associate with nausea and vomiting.  Found to be febrile with temperature 101.5 F rectally, tachycardic, tachypneic with elevated WBC count of 19.3 and urinalysis consistent with urinary tract infection.  CT abdomen/pelvis with contrast with abnormal right kidney and ureter consistent with a standing urinary  tract infection/acute pyelonephritis, no other acute or inflammatory process within the abdomen/pelvis. -- WBC 19.3>19.1 -- Blood culture: Positive for GNR's, BCID + Ecoli; pending further identification and susceptibilities -- Urine culture: Pending -- Ceftriaxone 2 g IV every 24 hours -- Supportive care, antipyretics, antiemetics  Hypokalemia Potassium 3.0 likely secondary to poor oral intake in the days preceding hospitalization.  Will replete. -- Repeat electrolytes in a.m.  Chronic diastolic congestive heart failure, compensated HTN BP 125/77.  Home regimen includes hydrochlorothiazide  25 mg p.o. daily, metoprolol  succinate 100 mg p.o. daily, losartan  100 mg p.o. daily -- Hold and hypertensives for now, monitor blood pressure closely -- Hydralazine 10mg  PO q8h PRN SBP >170  HLD -- Pravastatin  40 mg p.o. daily  DM2 Currently diet controlled at baseline.  6.5 on 06/22/2024, well-controlled. -- SSI for coverage -- CBGs qAC/HS  Hypothyroidism -- Levothyroxine  50 mcg p.o. daily  Generalized anxiety disorder Major depressive disorder -- Paxil  40 mg p.o. daily  Cognitive impairment Follows with neurology outpatient.  On donepezil . --Continue donepezil  5 mg p.o. daily --Delirium precautions --Get up during the day --Encourage a familiar face to remain present throughout the day --Keep blinds open and lights on during daylight hours --Minimize the use of opioids/benzodiazepines  OSA -- Nocturnal CPAP  Weakness/debility/deconditioning: -- PT/OT evaluation   DVT prophylaxis: heparin injection 5,000 Units Start: 07/26/24 1700    Code Status: Full Code Family Communication: No family present at bedside this morning  Disposition Plan:  Level of care: Progressive Status is: Inpatient Remains inpatient appropriate because: IV antibiotics, awaiting further culture identification/susceptibilities, PT/OT evaluation    Consultants:  None  Procedures:   None  Antimicrobials:  Ceftriaxone 9/29>> Aztreonam 9/29 - 9/29 Metronidazole  9/29 - 9/29 Vancomycin 9/29 - 9/29   Subjective: Patient seen examined  bedside, lying in bed.  No family present.  Sleeping but easily arousable.  Continues with generalized ill feeling, mild headache.  But more alert and interactive today.  Remains on IV antibiotics.  Blood cultures now growing E. coli.  Remains on IV antibiotics for sepsis secondary to pyelonephritis/UTI.  No other questions or concerns at this time.  Denies dizziness, no visual changes, no chest pain, no palpitations, no shortness of breath, no current fever, no chills/night sweats, no nausea/vomiting/diarrhea, no focal weakness, no fatigue, no paresthesias.  No acute events overnight per nurse staff.  Objective: Vitals:   07/26/24 1846 07/26/24 2218 07/27/24 0535 07/27/24 0900  BP:  129/78 125/77 101/81  Pulse:  (!) 104 98 100  Resp:  18 20   Temp:  99.2 F (37.3 C) 98.9 F (37.2 C) 98.7 F (37.1 C)  TempSrc:  Oral Oral Oral  SpO2:  100% 92% 100%  Weight: 70.1 kg     Height: 5' 2 (1.575 m)       Intake/Output Summary (Last 24 hours) at 07/27/2024 1039 Last data filed at 07/27/2024 0730 Gross per 24 hour  Intake 2163.99 ml  Output 1210 ml  Net 953.99 ml   Filed Weights   07/26/24 1846  Weight: 70.1 kg    Examination:  Physical Exam: GEN: NAD, alert and oriented x 3, ill/elderly appearance HEENT: NCAT, PERRL, EOMI, sclera clear, MMM PULM: CTAB w/o wheezes/crackles, normal respiratory effort on room air CV: RRR w/o M/G/R GI: abd soft, slight generalized TTP to palpation all 4 quadrants, + right CVA tenderness, no rebound/guarding/masses, + BS MSK: no peripheral edema, moves all extremities independently NEURO: C no focal neurological deficit PSYCH: normal mood/affect Integumentary: dry/intact, no rashes or wounds    Data Reviewed: I have personally reviewed following labs and imaging studies  CBC: Recent Labs   Lab 07/26/24 0729 07/27/24 0440  WBC 19.3* 19.1*  NEUTROABS 16.5*  --   HGB 10.8* 8.5*  HCT 31.4* 25.8*  MCV 83.7 86.3  PLT 177 148*   Basic Metabolic Panel: Recent Labs  Lab 07/26/24 0729 07/27/24 0440  NA 134* 138  K 3.4* 3.0*  CL 97* 105  CO2 22 21*  GLUCOSE 153* 120*  BUN 24* 24*  CREATININE 1.36* 1.03*  CALCIUM  9.5 8.6*  MG  --  1.4*   GFR: Estimated Creatinine Clearance: 47.3 mL/min (A) (by C-G formula based on SCr of 1.03 mg/dL (H)). Liver Function Tests: Recent Labs  Lab 07/26/24 0729  AST 28  ALT 16  ALKPHOS 100  BILITOT 1.1  PROT 7.7  ALBUMIN 3.8   No results for input(s): LIPASE, AMYLASE in the last 168 hours. No results for input(s): AMMONIA in the last 168 hours. Coagulation Profile: No results for input(s): INR, PROTIME in the last 168 hours. Cardiac Enzymes: No results for input(s): CKTOTAL, CKMB, CKMBINDEX, TROPONINI in the last 168 hours. BNP (last 3 results) No results for input(s): PROBNP in the last 8760 hours. HbA1C: No results for input(s): HGBA1C in the last 72 hours. CBG: Recent Labs  Lab 07/26/24 0723 07/26/24 1630 07/26/24 2220 07/27/24 0723  GLUCAP 164* 116* 148* 118*   Lipid Profile: No results for input(s): CHOL, HDL, LDLCALC, TRIG, CHOLHDL, LDLDIRECT in the last 72 hours. Thyroid  Function Tests: No results for input(s): TSH, T4TOTAL, FREET4, T3FREE, THYROIDAB in the last 72 hours. Anemia Panel: No results for input(s): VITAMINB12, FOLATE, FERRITIN, TIBC, IRON, RETICCTPCT in the last 72 hours. Sepsis Labs: Recent Labs  Lab 07/26/24 [phone]  07/26/24 1211 07/26/24 1655  LATICACIDVEN 1.5 1.7 1.8    Recent Results (from the past 240 hours)  Resp panel by RT-PCR (RSV, Flu A&B, Covid) Anterior Nasal Swab     Status: None   Collection Time: 07/26/24  7:51 AM   Specimen: Anterior Nasal Swab  Result Value Ref Range Status   SARS Coronavirus 2 by RT PCR NEGATIVE  NEGATIVE Final    Comment: (NOTE) SARS-CoV-2 target nucleic acids are NOT DETECTED.  The SARS-CoV-2 RNA is generally detectable in upper respiratory specimens during the acute phase of infection. The lowest concentration of SARS-CoV-2 viral copies this assay can detect is 138 copies/mL. A negative result does not preclude SARS-Cov-2 infection and should not be used as the sole basis for treatment or other patient management decisions. A negative result may occur with  improper specimen collection/handling, submission of specimen other than nasopharyngeal swab, presence of viral mutation(s) within the areas targeted by this assay, and inadequate number of viral copies(<138 copies/mL). A negative result must be combined with clinical observations, patient history, and epidemiological information. The expected result is Negative.  Fact Sheet for Patients:  BloggerCourse.com  Fact Sheet for Healthcare Providers:  SeriousBroker.it  This test is no t yet approved or cleared by the [place]  FDA and  has been authorized for detection and/or diagnosis of SARS-CoV-2 by FDA under an Emergency Use Authorization (EUA). This EUA will remain  in effect (meaning this test can be used) for the duration of the COVID-19 declaration under Section 564(b)(1) of the Act, 21 U.S.C.section 360bbb-3(b)(1), unless the authorization is terminated  or revoked sooner.       Influenza A by PCR NEGATIVE NEGATIVE Final   Influenza B by PCR NEGATIVE NEGATIVE Final    Comment: (NOTE) The Xpert Xpress SARS-CoV-2/FLU/RSV plus assay is intended as an aid in the diagnosis of influenza from Nasopharyngeal swab specimens and should not be used as a sole basis for treatment. Nasal washings and aspirates are unacceptable for Xpert Xpress SARS-CoV-2/FLU/RSV testing.  Fact Sheet for Patients: BloggerCourse.com  Fact Sheet for Healthcare  Providers: SeriousBroker.it  This test is not yet approved or cleared by the [place]  FDA and has been authorized for detection and/or diagnosis of SARS-CoV-2 by FDA under an Emergency Use Authorization (EUA). This EUA will remain in effect (meaning this test can be used) for the duration of the COVID-19 declaration under Section 564(b)(1) of the Act, 21 U.S.C. section 360bbb-3(b)(1), unless the authorization is terminated or revoked.     Resp Syncytial Virus by PCR NEGATIVE NEGATIVE Final    Comment: (NOTE) Fact Sheet for Patients: BloggerCourse.com  Fact Sheet for Healthcare Providers: SeriousBroker.it  This test is not yet approved or cleared by the [place]  FDA and has been authorized for detection and/or diagnosis of SARS-CoV-2 by FDA under an Emergency Use Authorization (EUA). This EUA will remain in effect (meaning this test can be used) for the duration of the COVID-19 declaration under Section 564(b)(1) of the Act, 21 U.S.C. section 360bbb-3(b)(1), unless the authorization is terminated or revoked.  Performed at [facility], [address] [place], [place] [address]   Culture, blood (single)     Status: Abnormal (Preliminary result)   Collection Time: 07/26/24  9:39 AM   Specimen: BLOOD RIGHT FOREARM  Result Value Ref Range Status   Specimen Description BLOOD RIGHT FOREARM  Final   Special Requests   Final    BOTTLES DRAWN AEROBIC AND ANAEROBIC Blood Culture adequate  volume   Culture  Setup Time   Final    GRAM NEGATIVE RODS IN BOTH AEROBIC AND ANAEROBIC BOTTLES CRITICAL RESULT CALLED TO, READ BACK BY AND VERIFIED WITH: PHARMD M. BELL [id number] AT 0027, ADC    Culture (A)  Final    ESCHERICHIA COLI SUSCEPTIBILITIES TO FOLLOW Performed at [facility], [address] [place], [place] [address]    Report Status PENDING  Incomplete  Blood Culture ID Panel  (Reflexed)     Status: Abnormal   Collection Time: 07/26/24  9:39 AM  Result Value Ref Range Status   Enterococcus faecalis NOT DETECTED NOT DETECTED Final   Enterococcus Faecium NOT DETECTED NOT DETECTED Final   Listeria monocytogenes NOT DETECTED NOT DETECTED Final   Staphylococcus species NOT DETECTED NOT DETECTED Final   Staphylococcus aureus (BCID) NOT DETECTED NOT DETECTED Final   Staphylococcus epidermidis NOT DETECTED NOT DETECTED Final   Staphylococcus lugdunensis NOT DETECTED NOT DETECTED Final   Streptococcus species NOT DETECTED NOT DETECTED Final   Streptococcus agalactiae NOT DETECTED NOT DETECTED Final   Streptococcus pneumoniae NOT DETECTED NOT DETECTED Final   Streptococcus pyogenes NOT DETECTED NOT DETECTED Final   A.calcoaceticus-baumannii NOT DETECTED NOT DETECTED Final   Bacteroides fragilis NOT DETECTED NOT DETECTED Final   Enterobacterales DETECTED (A) NOT DETECTED Final    Comment: Enterobacterales represent a large order of gram negative bacteria, not a single organism. CRITICAL RESULT CALLED TO, READ BACK BY AND VERIFIED WITH: PHARMD M. BELL [id number] AT 0027, ADC    Enterobacter cloacae complex NOT DETECTED NOT DETECTED Final   Escherichia coli DETECTED (A) NOT DETECTED Final    Comment: CRITICAL RESULT CALLED TO, READ BACK BY AND VERIFIED WITH: PHARMD M. BELL [id number] AT 0027, ADC    Klebsiella aerogenes NOT DETECTED NOT DETECTED Final   Klebsiella oxytoca NOT DETECTED NOT DETECTED Final   Klebsiella pneumoniae NOT DETECTED NOT DETECTED Final   Proteus species NOT DETECTED NOT DETECTED Final   Salmonella species NOT DETECTED NOT DETECTED Final   Serratia marcescens NOT DETECTED NOT DETECTED Final   Haemophilus influenzae NOT DETECTED NOT DETECTED Final   Neisseria meningitidis NOT DETECTED NOT DETECTED Final   Pseudomonas aeruginosa NOT DETECTED NOT DETECTED Final   Stenotrophomonas maltophilia NOT DETECTED NOT DETECTED Final   Candida albicans NOT  DETECTED NOT DETECTED Final   Candida auris NOT DETECTED NOT DETECTED Final   Candida glabrata NOT DETECTED NOT DETECTED Final   Candida krusei NOT DETECTED NOT DETECTED Final   Candida parapsilosis NOT DETECTED NOT DETECTED Final   Candida tropicalis NOT DETECTED NOT DETECTED Final   Cryptococcus neoformans/gattii NOT DETECTED NOT DETECTED Final   CTX-M ESBL NOT DETECTED NOT DETECTED Final   Carbapenem resistance IMP NOT DETECTED NOT DETECTED Final   Carbapenem resistance KPC NOT DETECTED NOT DETECTED Final   Carbapenem resistance NDM NOT DETECTED NOT DETECTED Final   Carbapenem resist OXA 48 LIKE NOT DETECTED NOT DETECTED Final   Carbapenem resistance VIM NOT DETECTED NOT DETECTED Final    Comment: Performed at [facility], [address] [place], [place] [address]         Radiology Studies: CT ABDOMEN PELVIS W CONTRAST Result Date: 07/26/2024 CLINICAL DATA:  68 year old female with altered mental status, lethargy, pain. EXAM: CT ABDOMEN AND PELVIS WITH CONTRAST TECHNIQUE: Multidetector CT imaging of the abdomen and pelvis was performed using the standard protocol following bolus administration of intravenous contrast. RADIATION DOSE REDUCTION: This exam was performed according  to the departmental dose-optimization program which includes automated exposure control, adjustment of the mA and/or kV according to patient size and/or use of iterative reconstruction technique. CONTRAST:  80mL OMNIPAQUE  IOHEXOL  300 MG/ML  SOLN COMPARISON:  Abdomen ultrasound 09/03/2016. FINDINGS: Lower chest: Mild, symmetric dependent opacity at both lung bases most resembles atelectasis. No pleural effusion. No pericardial effusion. Borderline to mild cardiomegaly. Hepatobiliary: Negative liver and gallbladder. Pancreas: Negative. Spleen: Negative. Adrenals/Urinary Tract: Normal adrenal glands. Left kidney and left ureter appear normal. But abnormal right renal enhancement, the right kidney appears inflamed,  indistinct. Right pararenal space confluent edema or inflammation (series 2, image 38). Delayed and/or striated nephrogram on that side. Urothelial thickening of the proximal right ureter. No right intrarenal calculus. Right ureter is highly indistinct, inflamed throughout its course (series 7, image 53) but does not seem abnormally dilated. And multiple soft tissue calcifications at the level of the proximal right ureter (series 2, images 40-42) are favored to be gonadal vessel phleboliths. Multiple pelvic phleboliths also. No calculus within the bladder. Distal right ureter seems decompressed. Stomach/Bowel: Redundant large bowel, intermittently gas distended. Mild to moderate volume of retained stool also. No abnormally dilated large bowel loops. Normal appendix in the right lower quadrant series 7, image 50. No large bowel inflammation identified. Nondilated small bowel. Small fat containing umbilical hernia (series 2, image 40) with no inflammation. Decompressed stomach. No pneumoperitoneum. No free fluid. Vascular/Lymphatic: Fairly advanced Aortoiliac calcified atherosclerosis. Major arterial structures remain patent in the abdomen and pelvis despite atherosclerosis. Portal venous system is patent. No lymphadenopathy. Reproductive: Retroverted uterus with densely calcified fibroid measuring up to 8 cm diameter (sagittal image 113, series 2, image 68). Otherwise negative. Other: No pelvis free fluid. Musculoskeletal: Advanced lower thoracic endplate degeneration. Advanced lumbar facet degeneration. No acute or suspicious osseous lesion. IMPRESSION: 1. Abnormal Right kidney and ureter, constellation of findings which favor Ascending Urinary Infection and Acute Pyelonephritis, rather than obstructive uropathy (although the latter difficult to completely exclude - suspect multiple right gonadal vein phleboliths are superimposed along the course of the right ureter). Correlation with urinalysis may be helpful. 2.  No other acute or inflammatory process identified in the abdomen or pelvis. Normal appendix. 3.  Aortic Atherosclerosis (ICD10-I70.0). Electronically Signed   By: VEAR Hurst M.D.   On: 07/26/2024 10:45   CT Head Wo Contrast Result Date: 07/26/2024 CLINICAL DATA:  69 year old female with altered mental status, lethargy, pain. EXAM: CT HEAD WITHOUT CONTRAST TECHNIQUE: Contiguous axial images were obtained from the base of the skull through the vertex without intravenous contrast. RADIATION DOSE REDUCTION: This exam was performed according to the departmental dose-optimization program which includes automated exposure control, adjustment of the mA and/or kV according to patient size and/or use of iterative reconstruction technique. COMPARISON:  Brain MRI 05/06/2023.  Head CT 08/15/2006. FINDINGS: Brain: Cerebral volume loss since the 2007 CT seems age-appropriate. No midline shift, ventriculomegaly, mass effect, evidence of mass lesion, intracranial hemorrhage or evidence of cortically based acute infarction. Patchy chronic white matter disease appears stable from the MRI last year. Maintained gray-white differentiation and no cortical encephalomalacia identified. Vascular: No suspicious intracranial vascular hyperdensity. Calcified atherosclerosis at the skull base. Skull: Partially visible extensive chronic suboccipital cervical posterior element surgery and fusion. Visible portion stable from 2007 head CT. No acute osseous abnormality identified. Sinuses/Orbits: Visualized paranasal sinuses and mastoids are stable and well aerated. Other: Postoperative changes to the left globe. No acute orbit or scalp soft tissue finding. IMPRESSION: 1. No acute intracranial abnormality.  2. Chronic cerebral white matter disease appears stable from MRI last year. 3. Extensive chronic suboccipital cervical spine surgery, that visible grossly stable since 2007. Electronically Signed   By: VEAR Hurst M.D.   On: 07/26/2024 10:20   DG  Chest Port 1 View Result Date: 07/26/2024 CLINICAL DATA:  Chest pain EXAM: PORTABLE CHEST 1 VIEW COMPARISON:  05/25/2019 FINDINGS: The lungs are clear without focal pneumonia, edema, pneumothorax or pleural effusion. The cardiopericardial silhouette is within normal limits for size. No acute bony abnormality. Telemetry leads overlie the chest. IMPRESSION: No active disease. Electronically Signed   By: Camellia Candle M.D.   On: 07/26/2024 08:20        Scheduled Meds:  donepezil   5 mg Oral Daily   heparin  5,000 Units Subcutaneous Q8H   insulin  aspart  0-15 Units Subcutaneous TID WC   insulin  aspart  0-5 Units Subcutaneous QHS   levothyroxine   50 mcg Oral Q0600   PARoxetine   40 mg Oral q morning   potassium chloride  30 mEq Oral Q3H   pravastatin   40 mg Oral Daily   Continuous Infusions:  cefTRIAXone (ROCEPHIN)  IV       LOS: 1 day    Time spent: 52 minutes spent on 07/27/2024 caring for this patient face-to-face including chart review, ordering labs/tests, documenting, discussion with nursing staff, consultants, updating family and interview/physical exam    Camellia PARAS [place], DO [facility] Available via Epic secure chat 7am-7pm After these hours, please refer to coverage provider listed on [email or web address] 07/27/2024, 10:39 AM

## 2024-07-28 DIAGNOSIS — N12 Tubulo-interstitial nephritis, not specified as acute or chronic: Secondary | ICD-10-CM | POA: Diagnosis not present

## 2024-07-28 LAB — BASIC METABOLIC PANEL WITH GFR
Anion gap: 10 (ref 5–15)
BUN: 21 mg/dL (ref 8–23)
CO2: 21 mmol/L — ABNORMAL LOW (ref 22–32)
Calcium: 8.7 mg/dL — ABNORMAL LOW (ref 8.9–10.3)
Chloride: 106 mmol/L (ref 98–111)
Creatinine, Ser: 0.92 mg/dL (ref 0.44–1.00)
GFR, Estimated: >60 mL/min (ref >60)
Glucose, Bld: 103 mg/dL — ABNORMAL HIGH (ref 70–99)
Potassium: 4 mmol/L (ref 3.5–5.1)
Sodium: 137 mmol/L (ref 135–145)

## 2024-07-28 LAB — URINE CULTURE: Culture: 40000 — AB

## 2024-07-28 LAB — CBC
HCT: 29.4 % — ABNORMAL LOW (ref 36.0–46.0)
Hemoglobin: 9.2 g/dL — ABNORMAL LOW (ref 12.0–15.0)
MCH: 27.4 pg (ref 26.0–34.0)
MCHC: 31.3 g/dL (ref 30.0–36.0)
MCV: 87.5 fL (ref 80.0–100.0)
Platelets: 158 K/uL (ref 150–400)
RBC: 3.36 MIL/uL — ABNORMAL LOW (ref 3.87–5.11)
RDW: 14 % (ref 11.5–15.5)
WBC: 19.1 K/uL — ABNORMAL HIGH (ref 4.0–10.5)
nRBC: 0 % (ref 0.0–0.2)

## 2024-07-28 LAB — PHOSPHORUS: Phosphorus: 1.4 mg/dL — ABNORMAL LOW (ref 2.5–4.6)

## 2024-07-28 LAB — GLUCOSE, CAPILLARY
Glucose-Capillary: 114 mg/dL — ABNORMAL HIGH (ref 70–99)
Glucose-Capillary: 173 mg/dL — ABNORMAL HIGH (ref 70–99)
Glucose-Capillary: 102 mg/dL — ABNORMAL HIGH (ref 70–99)
Glucose-Capillary: 140 mg/dL — ABNORMAL HIGH (ref 70–99)

## 2024-07-28 LAB — CULTURE, BLOOD (SINGLE): Special Requests: ADEQUATE

## 2024-07-28 LAB — MAGNESIUM: Magnesium: 2.3 mg/dL (ref 1.7–2.4)

## 2024-07-28 MED ORDER — METOPROLOL SUCCINATE ER 50 MG PO TB24
100.0000 mg | ORAL_TABLET | Freq: Every day | ORAL | Status: DC
Start: 2024-07-28 — End: 2024-08-03
  Administered 2024-07-28 – 2024-08-03 (×7): 100 mg via ORAL
  Filled 2024-07-28: qty 1
  Filled 2024-07-28: qty 2
  Filled 2024-07-28: qty 1
  Filled 2024-07-28 (×2): qty 2
  Filled 2024-07-28: qty 1
  Filled 2024-07-28: qty 2

## 2024-07-28 MED ORDER — DICLOFENAC SODIUM 1 % EX GEL
2.0000 g | Freq: Four times a day (QID) | CUTANEOUS | Status: DC
  Administered 2024-07-28 – 2024-08-03 (×23): 2 g via TOPICAL
  Filled 2024-07-28: qty 100

## 2024-07-28 MED ORDER — SODIUM CHLORIDE 0.9 % IV SOLN
30.0000 mmol | Freq: Once | INTRAVENOUS | Status: AC
  Administered 2024-07-28: 30 mmol via INTRAVENOUS
  Filled 2024-07-28: qty 10

## 2024-07-28 NOTE — Evaluation (Signed)
 Occupational Therapy Evaluation Patient Details Name: Debra [name] MRN: [id number] DOB: 25-Apr-1956 Today's Date: 07/28/2024   History of Present Illness   Patient is a 68 yo female presenting to the ED with confusion, lethargy, chest and abdominal pain and near syncopal episode on 07/26/24. CT head clear. Admitted with pyelonephritis and sepsis. PMH includes: chronic diastolic congestive heart failure, HTN, DMT2, major depressive disorder, generalized anxiety disorder, hypothyroidism, OSA, cognitive impairment     Clinical Impressions Prior to this admission, patient living alone, working for [employer], and driving. Patient tangential, impulsive, and with limited attention during evaluation, therefore unsure of accuracy of home information. Patient min A for ADL management and transfers, mainly due to safety as patient exited bed with OT present with IV still in the wall and requiring increased intervention and redirection. Ot recommending lesser intensity rehab < 3 hours prior to discharge home. If patient has 24/7 assist and her mentation clears, patient can potentially return home with [facility] services.     If plan is discharge home, recommend the following:   A little help with walking and/or transfers;A little help with bathing/dressing/bathroom;Assistance with cooking/housework;Direct supervision/assist for medications management;Direct supervision/assist for financial management;Assist for transportation;Supervision due to cognitive status;Help with stairs or ramp for entrance     Functional Status Assessment   Patient has had a recent decline in their functional status and demonstrates the ability to make significant improvements in function in a reasonable and predictable amount of time.     Equipment Recommendations   Other (comment) (defer to next venue)     Recommendations for Other Services         Precautions/Restrictions   Precautions Precautions: Fall Recall of  Precautions/Restrictions: Impaired Restrictions Weight Bearing Restrictions Per Provider Order: No     Mobility Bed Mobility Overal bed mobility: Needs Assistance Bed Mobility: Supine to Sit     Supine to sit: Contact guard     General bed mobility comments: CGA due to patient nearly pulling IV over    Transfers Overall transfer level: Needs assistance Equipment used: 1 person hand held assist Transfers: Sit to/from Stand Sit to Stand: Min assist           General transfer comment: Min A to steady as patient impulsively stood due to urinary urgency, IV still connected to wall, and needing OT assist to safely ambulate to [facility]      Balance Overall balance assessment: Mild deficits observed, not formally tested                                         ADL either performed or assessed with clinical judgement   ADL Overall ADL's : Needs assistance/impaired Eating/Feeding: Supervision/ safety;Sitting   Grooming: Wash/dry face;Wash/dry hands;Set up;Sitting   Upper Body Bathing: Contact guard assist;Sitting   Lower Body Bathing: Minimal assistance;Sit to/from stand;Sitting/lateral leans   Upper Body Dressing : Contact guard assist;Sitting   Lower Body Dressing: Minimal assistance;Sitting/lateral leans;Sit to/from stand   Toilet Transfer: Minimal assistance;Ambulation;BSC/3in1   Toileting- Clothing Manipulation and Hygiene: Contact guard assist;Sitting/lateral lean;Sit to/from stand       Functional mobility during ADLs: Minimal assistance;Cueing for safety;Cueing for sequencing General ADL Comments: Prior to this admission, patient living alone, working for [employer], and driving. Patient tangential, impulsive, and with limited attention during evaluation, therefore unsure of accuracy of home information. Patient min A for ADL management and transfers, mainly  due to safety as patient exited bed with OT present with IV still in the wall and requiring  increased intervention and redirection. Ot recommending lesser intensity rehab < 3 hours prior to discharge home. If patient has 24/7 assist and her mentation clears, patient can potentially return home with [facility] services.     Vision Baseline Vision/History: 0 No visual deficits Ability to See in Adequate Light: 0 Adequate Patient Visual Report: No change from baseline Vision Assessment?: No apparent visual deficits     Perception Perception: Not tested       Praxis Praxis: Not tested       Pertinent Vitals/Pain Pain Assessment Pain Assessment: 0-10 Pain Score: 4  Pain Location: abdomen Pain Descriptors / Indicators: Discomfort, Grimacing, Guarding Pain Intervention(s): Limited activity within patient's tolerance, Monitored during session, Repositioned     Extremity/Trunk Assessment Upper Extremity Assessment Upper Extremity Assessment: Right hand dominant;Overall WFL for tasks assessed   Lower Extremity Assessment Lower Extremity Assessment: Defer to PT evaluation   Cervical / Trunk Assessment Cervical / Trunk Assessment: Kyphotic (minimally)   Communication Communication Communication: No apparent difficulties   Cognition Arousal: Alert Behavior During Therapy: Impulsive, Restless Cognition: History of cognitive impairments             OT - Cognition Comments: Patient impulsive, exiting bed with IV still connected to the wall requiring increased OT intervention                 Following commands: Impaired Following commands impaired: Follows multi-step commands inconsistently, Follows one step commands inconsistently     Cueing  General Comments   Cueing Techniques: Gestural cues;Verbal cues;Tactile cues  VSS on RA   Exercises     Shoulder Instructions      Home Living Family/patient expects to be discharged to:: Private residence Living Arrangements: Alone Available Help at Discharge: Family;Available PRN/intermittently Type of Home:  House Home Access: Stairs to enter [employer] of Steps: 2 Entrance Stairs-Rails: None Home Layout: Multi-level Alternate Level Stairs-Number of Steps: split level, 6 Alternate Level Stairs-Rails: Right Bathroom Shower/Tub: Chief Strategy Officer: Standard     Home Equipment: Cane - single point   Additional Comments: history may be inaccurate      Prior Functioning/Environment Prior Level of Function : Independent/Modified Independent;Driving;Working/employed             Mobility Comments: independent ADLs Comments: works at [employer], still drives    OT Problem List: Decreased strength;Decreased activity tolerance;Impaired balance (sitting and/or standing);Decreased coordination;Decreased cognition;Decreased safety awareness;Decreased knowledge of use of DME or AE;Decreased knowledge of precautions   OT Treatment/Interventions: Self-care/ADL training;Therapeutic exercise;Energy conservation;DME and/or AE instruction;Manual therapy;Therapeutic activities;Patient/family education;Balance training;Cognitive remediation/compensation      OT Goals(Current goals can be found in the care plan section)   Acute Rehab OT Goals Patient Stated Goal: to get a bath OT Goal Formulation: With patient Time For Goal Achievement: 08/11/24 Potential to Achieve Goals: Fair ADL Goals Pt Will Perform Lower Body Bathing: with modified independence;sitting/lateral leans;sit to/from stand Pt Will Perform Lower Body Dressing: with modified independence;sit to/from stand;sitting/lateral leans Pt Will Transfer to Toilet: with modified independence;ambulating;regular height toilet Pt Will Perform Toileting - Clothing Manipulation and hygiene: with modified independence;sitting/lateral leans;sit to/from stand Additional ADL Goal #1: Patient will demonstrate increased online awareness without cues or assist when completing ADLs and functional mobility in order to return home  safely. Additional ADL Goal #2: Patient will be able to complete functional task in standing for 3-5 minutes  prior to needing seated rest break in order to increase activity tolerance.   OT Frequency:  Min 2X/week    Co-evaluation              AM-PAC OT 6 Clicks Daily Activity     Outcome Measure Help from another person eating meals?: A Little Help from another person taking care of personal grooming?: A Little Help from another person toileting, which includes using toliet, bedpan, or urinal?: A Little Help from another person bathing (including washing, rinsing, drying)?: A Little Help from another person to put on and taking off regular upper body clothing?: A Little Help from another person to put on and taking off regular lower body clothing?: A Little 6 Click Score: 18   End of Session Nurse Communication: Mobility status;Other (comment) (wants to get a bath)  Activity Tolerance: Patient tolerated treatment well Patient left: in bed;with call bell/phone within reach;with chair alarm set;Other (comment) (sitting EOB)  OT Visit Diagnosis: Unsteadiness on feet (R26.81);Other abnormalities of gait and mobility (R26.89);Muscle weakness (generalized) (M62.81);History of falling (Z91.81);Other symptoms and signs involving cognitive function                Time: 9082-9059 OT Time Calculation (min): 23 min Charges:  OT General Charges $OT Visit: 1 Visit OT Evaluation $OT Eval Moderate Complexity: 1 Mod OT Treatments $Self Care/Home Management : 8-22 mins  Ronal Gift E. Landyn Buckalew, OTR/L Acute Rehabilitation Services [phone]   Ronal Gift Salt 07/28/2024, 1:47 PM

## 2024-07-28 NOTE — Evaluation (Signed)
 Physical Therapy Evaluation Patient Details Name: Debra [name] MRN: [id number] DOB: [birth year] Today's Date: 07/28/2024   History of Present Illness : Patient is a 68 yo female presenting to the ED with confusion, lethargy, chest and abdominal pain and near syncopal episode on 07/26/24. CT head clear. Admitted with pyelonephritis and sepsis. PMH includes: chronic diastolic congestive heart failure, HTN, DMT2, major depressive disorder, generalized anxiety disorder, hypothyroidism, OSA, cognitive impairment   Clinical Impression: Pt is a 68 y.o. female with above HPI resulting in the deficits listed below (see PT Problem List). Pt is typically independent at baseline without use of AD, drives, works at [employer], and performs ADLs independently. Pt performed sit to stand transfers and ambulation with contact guard assist- noted mild balance deficits. Upon eval pt demonstrates decreased activity tolerance, decreased muscular endurance, and cognitive impairments requiring multimodal cuing for attention to task, cues for appropriate responses to questions, and to maintain safety during mobility. Pt will benefit from skilled PT to maximize functional mobility to increase independence.  Recommending inpatient rehab <3hrs/day upon d/c due to current deficits. If pt has 24/7 assist/supervision or cognition improves may potentially be able to d/c to home vs rehab facility.         07/28/24 1100  PT Visit Information  Last PT Received On 07/28/24  Home Living  Family/patient expects to be discharged to: Private residence  Living Arrangements Alone  Available Help at Discharge Family;Friend(s);Available PRN/intermittently  Type of Home House  Home Access Stairs to enter  Entrance Stairs-Number of Steps 2 (back entrance)  Entrance Stairs-Rails None  Home Layout Multi-level  Alternate Level Stairs-Number of Steps split level, 6  Alternate Level Stairs-Rails Right  Bathroom Shower/Tub Tub/shower unit   Dealer - single point  Additional Comments Pt is a questionable historian. Cousin lives nearby, but moving soon. Friend lives nearby.  Prior Function  Prior Level of Function  Independent/Modified Independent;Driving;Working/employed  Mobility Comments reports fall can't recall when. No AD use at baseline.  ADLs Comments works at [employer], still drives  Pain Assessment  Pain Assessment No/denies pain  Pain Score 0  Cognition  Arousal Alert  Behavior During Therapy Restless  PT - Cognitive impairments No family/caregiver present to determine baseline  PT - Cognition Comments no family present to confirm PLOF/home set up. Pt noted to have increased difficulty with word finding, memory issues, and increased time required for processing. Pt aware that she has been havbing difficutly with memory and expresses frustration with difficulty recalling words/thoughts.  Following Commands  Following commands Impaired  Following commands impaired Follows multi-step commands inconsistently  Cueing  Cueing Techniques Gestural cues;Verbal cues;Tactile cues  Communication  Communication No apparent difficulties  Upper Extremity Assessment  Upper Extremity Assessment Right hand dominant  Lower Extremity Assessment  Lower Extremity Assessment Generalized weakness (decreased muscular endurance)  Bed Mobility  General bed mobility comments Pt sitting EOB upon PT entry  Transfers  Overall transfer level Needs assistance  Equipment used None  Transfers Sit to/from Stand  Sit to Stand Contact guard assist  General transfer comment incr time to initiate stand due to cognitive deficits. requires repetition for attention to task.  Ambulation/Gait  Ambulation/Gait assistance Contact guard assist  Gait Distance (Feet) 70 Feet  Assistive device None  Gait Pattern/deviations Step-through pattern;Decreased stride length  General Gait Details initially with furniture  reaching for stability, increased fatigue and pt rpeorting she does not feel like herself. Intermittent drifting.  Gait velocity decreased  Balance  Overall balance assessment Mild deficits observed, not formally tested  PT - End of Session  Equipment Utilized During Treatment Gait belt  Activity Tolerance Patient tolerated treatment well  Patient left in bed;with bed alarm set;with call bell/phone within reach  Nurse Communication Mobility status  PT Assessment  PT Recommendation/Assessment Patient needs continued PT services  PT Visit Diagnosis Unsteadiness on feet (R26.81);Muscle weakness (generalized) (M62.81)  PT Problem List Decreased strength;Decreased activity tolerance;Decreased balance;Decreased safety awareness  PT Plan  PT Frequency (ACUTE ONLY) Min 2X/week  PT Treatment/Interventions (ACUTE ONLY) DME instruction;Gait training;Stair training;Functional mobility training;Therapeutic activities;Therapeutic exercise;Balance training;Patient/family education  AM-PAC PT 6 Clicks Mobility Outcome Measure (Version 2)  Help needed turning from your back to your side while in a flat bed without using bedrails? 4  Help needed moving from lying on your back to sitting on the side of a flat bed without using bedrails? 3  Help needed moving to and from a bed to a chair (including a wheelchair)? 3  Help needed standing up from a chair using your arms (e.g., wheelchair or bedside chair)? 3  Help needed to walk in hospital room? 3  Help needed climbing 3-5 steps with a railing?  2  6 Click Score 18  Consider Recommendation of Discharge To: Home with [facility]  Progressive Mobility  What is the highest level of mobility based on the mobility assessment? Level 4 (Ambulates with assistance) - Balance while stepping forward/back - Complete  Mobility Referral Yes  Activity Ambulated with assistance  PT Recommendation  Follow Up Recommendations Skilled nursing-short term rehab (<3 hours/day)  Can  patient physically be transported by private vehicle Yes  Patient can return home with the following Supervision due to cognitive status;Help with stairs or ramp for entrance;Assist for transportation;Direct supervision/assist for financial management;Direct supervision/assist for medications management;Assistance with cooking/housework;A little help with bathing/dressing/bathroom;A little help with walking and/or transfers  Functional Status Assessment Patient has had a recent decline in their functional status and demonstrates the ability to make significant improvements in function in a reasonable and predictable amount of time.  PT equipment None recommended by PT  Individuals Consulted  Consulted and Agree with Results and Recommendations Patient  Acute Rehab PT Goals  Patient Stated Goal feel better  Time For Goal Achievement 08/11/24  Potential to Achieve Goals Good  PT Time Calculation  PT Start Time (ACUTE ONLY) 1148  PT Stop Time (ACUTE ONLY) 1204  PT Time Calculation (min) (ACUTE ONLY) 16 min  PT General Charges  $$ ACUTE PT VISIT 1 Visit  PT Evaluation  $PT Eval Low Complexity 1 Low       Tinnie BERRY PT, DPT  Acute Rehabilitation Services  07/28/2024, 3:20 PM

## 2024-07-28 NOTE — TOC Initial Note (Signed)
 Transition of Care [facility]) - Initial/Assessment Note   Patient Details  Name: Debra [name] MRN: [id number] Date of Birth: 16-Dec-1955  Transition of Care [facility]) CM/SW Contact:    Duwaine GORMAN Aran, LCSW Phone Number: 07/28/2024, 1:16 PM  Clinical Narrative: Patient is from home. Patient is currently requiring IV antibiotics. PT/OT consulted. Care management awaiting recommendations.  Expected Discharge Plan:  (TBD) Barriers to Discharge: Continued Medical Work up  Expected Discharge Plan and Services In-house Referral: Clinical Social Work Living arrangements for the past 2 months: Single Family Home  Prior Living Arrangements/Services Living arrangements for the past 2 months: Single Family Home Lives with:: Self Patient language and need for interpreter reviewed:: Yes Do you feel safe going back to the place where you live?: Yes      Need for Family Participation in Patient Care: No (Comment) Care giver support system in place?: Yes (comment) Criminal Activity/Legal Involvement Pertinent to Current Situation/Hospitalization: No - Comment as needed  Activities of Daily Living ADL Screening (condition at time of admission) Independently performs ADLs?: Yes (appropriate for developmental age)  Emotional Assessment Orientation: : Oriented to Self, Oriented to Place, Oriented to  Time, Oriented to Situation Alcohol / Substance Use: Not Applicable Psych Involvement: No (comment)  Admission diagnosis:  Pyelonephritis [N12] AKI (acute kidney injury) [N17.9] Sepsis, due to unspecified organism, unspecified whether acute organ dysfunction present [facility]) [A41.9] Patient Active Problem List   Diagnosis Date Noted   Pyelonephritis 07/26/2024   Overactive bladder 05/28/2024   Chronic idiopathic constipation 05/28/2024   Sacrodynia 04/15/2024   Pelvic mass 04/15/2024   Expressive language impairment 04/13/2024   Type 2 diabetes mellitus with hyperglycemia, without long-term current use of  insulin  08/09/2022   Snapping lateral band due to ligament laxity 02/19/2022   Polyuria 01/01/2022   SUI (stress urinary incontinence, female) 01/01/2022   Trigger thumb, right thumb 09/13/2021   Pruritic dermatitis 05/25/2021   Balance disorder 05/25/2021   Dizziness 05/25/2021   Mild cognitive impairment of uncertain or unknown etiology 01/30/2021   Osteoarthritis    Vitamin D  deficiency    Urinary frequency 10/31/2020   Chronic pain of both shoulders 10/31/2020   Female pattern baldness 10/06/2020   Myalgia 10/06/2020   Urge incontinence of urine 10/06/2020   Allergies 02/03/2019   Acute vaginitis 04/22/2018   Morbid obesity 04/22/2018   Generalized anxiety disorder 04/22/2018   Obstructive sleep apnea 01/30/2018   Major depressive disorder 01/30/2018   Pansinusitis 01/30/2018   Aortic atherosclerosis 10/10/2017   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   Hyperlipidemia 04/17/2017   Anemia, iron deficiency 04/17/2017   Atypical chest pain 08/31/2014   Arm weakness 08/31/2014   Hearing loss 07/24/2012   Hypothyroidism 11/02/2010   Essential hypertension 08/31/2010   Hyperglycemia, fasting 05/04/2010   Diastolic dysfunction 02/15/2010   Back pain with radiculopathy 01/25/2010   Fatigue 01/25/2010   Tobacco use 12/01/2008   Restless legs syndrome 12/01/2008   Leg pain, bilateral 11/17/2008   Palpitations 02/12/2008   GERD (gastroesophageal reflux disease) 07/16/2007   Hiatal hernia 07/16/2007   Knee pain 07/16/2007   Low back pain 07/16/2007   Peripheral vertigo 04/30/2007   Insomnia 04/30/2007   Abdominal pain, right upper quadrant 04/30/2007   PCP:  Antonio Cyndee Jamee JONELLE, DO Pharmacy:   [facility] [address] [place], [place] - [address] [place] [address] Phone: [phone] Fax: [phone]  [facility] Delivery - [place], [place] - [address] [phone] W  [address] [place] [address] Phone:  [phone] Fax: [phone]  Social Drivers of Health (SDOH) Social History: SDOH Screenings   Food Insecurity: No Food Insecurity (07/26/2024)  Housing: Low Risk  (07/26/2024)  Transportation Needs: No Transportation Needs (07/27/2024)  Utilities: Not At Risk (07/26/2024)  Alcohol Screen: Low Risk  (01/06/2024)  Depression (PHQ2-9): High Risk (03/17/2024)  Financial Resource Strain: Low Risk  (01/06/2024)  Physical Activity: Inactive (01/06/2024)  Social Connections: Moderately Isolated (07/26/2024)  Stress: Stress Concern Present (01/06/2024)  Tobacco Use: High Risk (07/26/2024)  Health Literacy: Adequate Health Literacy (01/06/2024)   SDOH Interventions: Food Insecurity Interventions: Intervention Not Indicated, Inpatient TOC Transportation Interventions: Intervention Not Indicated Utilities Interventions: Intervention Not Indicated, Inpatient TOC  Readmission Risk Interventions     No data to display

## 2024-07-28 NOTE — Progress Notes (Signed)
 PROGRESS NOTE    Debra [name]  FMW:[id number] DOB: 1956-10-18 DOA: 07/26/2024 PCP: Antonio Cyndee Jamee JONELLE, DO    Brief Narrative:   Debra [name] is a 68 y.o. female with past medical history significant for chronic diastolic congestive heart failure, HTN, DMT2, major depressive disorder, generalized anxiety disorder, hypothyroidism, OSA, cognitive impairment who presented to [facility] ED on 07/26/2024 with generalized pain, nausea/vomiting, lethargy and near syncope.  In the ED, temperature 101.5 F rectally, HR 103, RR 22, BP 91/61, SpO2 94% on room air.  WBC 19.3, hemoglobin 10.8, platelet count 177.  Sodium 134, potassium 3.4, chloride 97, CO2 22, glucose 153, BUN 24, creat 1.36.  AST 28, ALT 16, total bilirubin 1.1.  High-sensitivity troponin less than 15.  Lactic acid 1.5.  COVID/influenza/RSV PCR negative.  Urinalysis with moderate leukocytes, positive nitrite, many bacteria, greater than 50 WBCs.  Assessment & Plan:   Acute metabolic encephalopathy, POA: Improving Patient presenting with confusion, lethargy in the setting of fever, elevated WBC count, urinalysis consistent with urinary tract infection and abdominal imaging consistent with right-sided pyelonephritis.  CT head without contrast with no acute intracranial abnormality, noted chronic cerebral white matter disease stable from MRI prior year, extensive chronic suboccipital cervical spine surgery that is grossly stable since 2007. -- Supportive care, treatment as below -Delirium precautions  Severe sepsis, POA--E. coli bacteremia Urinary tract infection Right pyelonephritis Patient presenting to ED with confusion, generalized pain associate with nausea and vomiting.  Found to be febrile with temperature 101.5 F rectally, tachycardic, tachypneic with elevated WBC count of 19.3 and urinalysis consistent with urinary tract infection.  CT abdomen/pelvis with contrast with abnormal right kidney and ureter consistent  with a standing urinary tract infection/acute pyelonephritis, no other acute or inflammatory process within the abdomen/pelvis. -- Trend WBCs -- Blood culture: Positive for GNR's, BCID + Ecoli; pending further identification and susceptibilities -- Urine culture: E. coli -- Ceftriaxone 2 g IV every 24 hours -- Supportive care, antipyretics, antiemetics  Hypokalemia Potassium 3.0 likely secondary to poor oral intake in the days preceding hospitalization.  W -- Replete  Chronic diastolic congestive heart failure, compensated HTN -Resume home meds as able  HLD -- Pravastatin  40 mg p.o. daily  DM2 Currently diet controlled at baseline.  6.5 on 06/22/2024, well-controlled. -- SSI for coverage -- CBGs qAC/HS  Hypothyroidism -- Levothyroxine  50 mcg p.o. daily  Generalized anxiety disorder Major depressive disorder -- Paxil  40 mg p.o. daily  Cognitive impairment Follows with neurology outpatient.  On donepezil . --Continue donepezil  5 mg p.o. daily --Delirium precautions --Get up during the day --Encourage a familiar face to remain present throughout the day --Keep blinds open and lights on during daylight hours --Minimize the use of opioids/benzodiazepines  OSA -- Nocturnal CPAP  Weakness/debility/deconditioning: -- PT/OT evaluation   DVT prophylaxis: heparin injection 5,000 Units Start: 07/26/24 1700    Code Status: Full Code Family Communication: No family present at bedside this morning  Disposition Plan:  Level of care: Progressive Status is: Inpatient Remains inpatient appropriate because: IV antibiotics, awaiting further culture identification/susceptibilities, PT/OT evaluation    Consultants:  None   Subjective: Complaining of back pain varies from upper to lower depending on who asks  Objective: Vitals:   07/27/24 2029 07/28/24 0121 07/28/24 0544 07/28/24 0754  BP: (!) 149/93  (!) 142/87 (!) 140/88  Pulse: (!) 101  98 93  Resp: 20 14    Temp: 98.3  F (36.8 C)  99.5 F (37.5 C) 98.7 F (  37.1 C)  TempSrc: Oral  Oral Oral  SpO2: 96%  91% 93%  Weight:      Height:        Intake/Output Summary (Last 24 hours) at 07/28/2024 1100 Last data filed at 07/28/2024 0900 Gross per 24 hour  Intake 350.54 ml  Output 400 ml  Net -49.46 ml   Filed Weights   07/26/24 1846  Weight: 70.1 kg    Examination:  Physical Exam:  General: Appearance:     Overweight female in no acute distress     Lungs:     Clear to auscultation bilaterally, respirations unlabored  Heart:    Normal heart rate. Normal rhythm. No murmurs, rubs, or gallops.   MS:   All extremities are intact.   Neurologic:   Awake, alert       Data Reviewed: I have personally reviewed following labs and imaging studies  CBC: Recent Labs  Lab 07/26/24 0729 07/27/24 0440 07/28/24 0416  WBC 19.3* 19.1* 19.1*  NEUTROABS 16.5*  --   --   HGB 10.8* 8.5* 9.2*  HCT 31.4* 25.8* 29.4*  MCV 83.7 86.3 87.5  PLT 177 148* 158   Basic Metabolic Panel: Recent Labs  Lab 07/26/24 0729 07/27/24 0440 07/28/24 0416  NA 134* 138 137  K 3.4* 3.0* 4.0  CL 97* 105 106  CO2 22 21* 21*  GLUCOSE 153* 120* 103*  BUN 24* 24* 21  CREATININE 1.36* 1.03* 0.92  CALCIUM  9.5 8.6* 8.7*  MG  --  1.4* 2.3  PHOS  --   --  1.4*   GFR: Estimated Creatinine Clearance: 52.9 mL/min (by C-G formula based on SCr of 0.92 mg/dL). Liver Function Tests: Recent Labs  Lab 07/26/24 0729  AST 28  ALT 16  ALKPHOS 100  BILITOT 1.1  PROT 7.7  ALBUMIN 3.8   No results for input(s): LIPASE, AMYLASE in the last 168 hours. No results for input(s): AMMONIA in the last 168 hours. Coagulation Profile: No results for input(s): INR, PROTIME in the last 168 hours. Cardiac Enzymes: No results for input(s): CKTOTAL, CKMB, CKMBINDEX, TROPONINI in the last 168 hours. BNP (last 3 results) No results for input(s): PROBNP in the last 8760 hours. HbA1C: No results for input(s): HGBA1C in  the last 72 hours. CBG: Recent Labs  Lab 07/27/24 1649 07/27/24 1718 07/27/24 1743 07/27/24 2030 07/28/24 0733  GLUCAP 61* 61* 134* 139* 114*   Lipid Profile: No results for input(s): CHOL, HDL, LDLCALC, TRIG, CHOLHDL, LDLDIRECT in the last 72 hours. Thyroid  Function Tests: No results for input(s): TSH, T4TOTAL, FREET4, T3FREE, THYROIDAB in the last 72 hours. Anemia Panel: No results for input(s): VITAMINB12, FOLATE, FERRITIN, TIBC, IRON, RETICCTPCT in the last 72 hours. Sepsis Labs: Recent Labs  Lab 07/26/24 0729 07/26/24 1211 07/26/24 1655  LATICACIDVEN 1.5 1.7 1.8    Recent Results (from the past 240 hours)  Resp panel by RT-PCR (RSV, Flu A&B, Covid) Anterior Nasal Swab     Status: None   Collection Time: 07/26/24  7:51 AM   Specimen: Anterior Nasal Swab  Result Value Ref Range Status   SARS Coronavirus 2 by RT PCR NEGATIVE NEGATIVE Final    Comment: (NOTE) SARS-CoV-2 target nucleic acids are NOT DETECTED.  The SARS-CoV-2 RNA is generally detectable in upper respiratory specimens during the acute phase of infection. The lowest concentration of SARS-CoV-2 viral copies this assay can detect is 138 copies/mL. A negative result does not preclude SARS-Cov-2 infection and should not be used as  the sole basis for treatment or other patient management decisions. A negative result may occur with  improper specimen collection/handling, submission of specimen other than nasopharyngeal swab, presence of viral mutation(s) within the areas targeted by this assay, and inadequate number of viral copies(<138 copies/mL). A negative result must be combined with clinical observations, patient history, and epidemiological information. The expected result is Negative.  Fact Sheet for Patients:  BloggerCourse.com  Fact Sheet for Healthcare Providers:  SeriousBroker.it  This test is no t yet approved  or cleared by the [place]  FDA and  has been authorized for detection and/or diagnosis of SARS-CoV-2 by FDA under an Emergency Use Authorization (EUA). This EUA will remain  in effect (meaning this test can be used) for the duration of the COVID-19 declaration under Section 564(b)(1) of the Act, 21 U.S.C.section 360bbb-3(b)(1), unless the authorization is terminated  or revoked sooner.       Influenza A by PCR NEGATIVE NEGATIVE Final   Influenza B by PCR NEGATIVE NEGATIVE Final    Comment: (NOTE) The Xpert Xpress SARS-CoV-2/FLU/RSV plus assay is intended as an aid in the diagnosis of influenza from Nasopharyngeal swab specimens and should not be used as a sole basis for treatment. Nasal washings and aspirates are unacceptable for Xpert Xpress SARS-CoV-2/FLU/RSV testing.  Fact Sheet for Patients: BloggerCourse.com  Fact Sheet for Healthcare Providers: SeriousBroker.it  This test is not yet approved or cleared by the [place]  FDA and has been authorized for detection and/or diagnosis of SARS-CoV-2 by FDA under an Emergency Use Authorization (EUA). This EUA will remain in effect (meaning this test can be used) for the duration of the COVID-19 declaration under Section 564(b)(1) of the Act, 21 U.S.C. section 360bbb-3(b)(1), unless the authorization is terminated or revoked.     Resp Syncytial Virus by PCR NEGATIVE NEGATIVE Final    Comment: (NOTE) Fact Sheet for Patients: BloggerCourse.com  Fact Sheet for Healthcare Providers: SeriousBroker.it  This test is not yet approved or cleared by the [place]  FDA and has been authorized for detection and/or diagnosis of SARS-CoV-2 by FDA under an Emergency Use Authorization (EUA). This EUA will remain in effect (meaning this test can be used) for the duration of the COVID-19 declaration under Section 564(b)(1) of the Act,  21 U.S.C. section 360bbb-3(b)(1), unless the authorization is terminated or revoked.  Performed at [facility], [address] [place], [place] [address]   Culture, blood (single)     Status: Abnormal   Collection Time: 07/26/24  9:39 AM   Specimen: BLOOD RIGHT FOREARM  Result Value Ref Range Status   Specimen Description BLOOD RIGHT FOREARM  Final   Special Requests   Final    BOTTLES DRAWN AEROBIC AND ANAEROBIC Blood Culture adequate volume   Culture  Setup Time   Final    GRAM NEGATIVE RODS IN BOTH AEROBIC AND ANAEROBIC BOTTLES CRITICAL RESULT CALLED TO, READ BACK BY AND VERIFIED WITH: PHARMD M. BELL [id number] AT 0027, ADC Performed at [facility], [address], [place], [place] [address]    Culture ESCHERICHIA COLI (A)  Final   Report Status 07/28/2024 FINAL  Final   Organism ID, Bacteria ESCHERICHIA COLI  Final      Susceptibility   Escherichia coli - MIC*    AMPICILLIN <=2 SENSITIVE Sensitive     CEFAZOLIN (NON-URINE) <=1 SENSITIVE Sensitive     CEFEPIME <=0.12 SENSITIVE Sensitive     ERTAPENEM <=0.12 SENSITIVE Sensitive     CEFTRIAXONE <=  0.25 SENSITIVE Sensitive     CIPROFLOXACIN  <=0.06 SENSITIVE Sensitive     GENTAMICIN <=1 SENSITIVE Sensitive     MEROPENEM <=0.25 SENSITIVE Sensitive     TRIMETH /SULFA  <=20 SENSITIVE Sensitive     AMPICILLIN/SULBACTAM <=2 SENSITIVE Sensitive     PIP/TAZO Value in next row Sensitive      <=4 SENSITIVEThis is a modified FDA-approved test that has been validated and its performance characteristics determined by the reporting laboratory.  This laboratory is certified under the Clinical Laboratory Improvement Amendments CLIA as qualified to perform high complexity clinical laboratory testing.    * ESCHERICHIA COLI  Urine Culture     Status: Abnormal   Collection Time: 07/26/24  9:39 AM   Specimen: Urine, Random  Result Value Ref Range Status   Specimen Description   Final    URINE, RANDOM Performed at [facility], [address] [place], [place] [address]    Special Requests   Final    NONE Reflexed from [id number] Performed at [facility], [address] [place], [place] [address]    Culture (A)  Final    40,000 COLONIES/mL ESCHERICHIA COLI Two isolates with different morphologies were identified as the same organism.The most resistant organism was reported. Performed at [facility], [address] [place], [place] [address]    Report Status 07/28/2024 FINAL  Final   Organism ID, Bacteria ESCHERICHIA COLI (A)  Final      Susceptibility   Escherichia coli - MIC*    AMPICILLIN RESISTANT Resistant     CEFAZOLIN (URINE) Value in next row Resistant      >=32 RESISTANTThis is a modified FDA-approved test that has been validated and its performance characteristics determined by the reporting laboratory.  This laboratory is certified under the Clinical Laboratory Improvement Amendments CLIA as qualified to perform high complexity clinical laboratory testing.    CEFEPIME Value in next row Sensitive      >=32 RESISTANTThis is a modified FDA-approved test that has been validated and its performance characteristics determined by the reporting laboratory.  This laboratory is certified under the Clinical Laboratory Improvement Amendments CLIA as qualified to perform high complexity clinical laboratory testing.    ERTAPENEM Value in next row Sensitive      >=32 RESISTANTThis is a modified FDA-approved test that has been validated and its performance characteristics determined by the reporting laboratory.  This laboratory is certified under the Clinical Laboratory Improvement Amendments CLIA as qualified to perform high complexity clinical laboratory testing.    CEFTRIAXONE Value in next row Sensitive      >=32 RESISTANTThis is a modified FDA-approved test that has been validated and its performance characteristics determined by the reporting laboratory.  This laboratory is certified under  the Clinical Laboratory Improvement Amendments CLIA as qualified to perform high complexity clinical laboratory testing.    CIPROFLOXACIN  Value in next row Sensitive      >=32 RESISTANTThis is a modified FDA-approved test that has been validated and its performance characteristics determined by the reporting laboratory.  This laboratory is certified under the Clinical Laboratory Improvement Amendments CLIA as qualified to perform high complexity clinical laboratory testing.    GENTAMICIN Value in next row Sensitive      >=32 RESISTANTThis is a modified FDA-approved test that has been validated and its performance characteristics determined by the reporting laboratory.  This laboratory is certified under the Clinical Laboratory Improvement Amendments CLIA as qualified to perform high complexity clinical  laboratory testing.    NITROFURANTOIN Value in next row Sensitive      >=32 RESISTANTThis is a modified FDA-approved test that has been validated and its performance characteristics determined by the reporting laboratory.  This laboratory is certified under the Clinical Laboratory Improvement Amendments CLIA as qualified to perform high complexity clinical laboratory testing.    TRIMETH /SULFA  Value in next row Sensitive      >=32 RESISTANTThis is a modified FDA-approved test that has been validated and its performance characteristics determined by the reporting laboratory.  This laboratory is certified under the Clinical Laboratory Improvement Amendments CLIA as qualified to perform high complexity clinical laboratory testing.    AMPICILLIN/SULBACTAM Value in next row Sensitive      >=32 RESISTANTThis is a modified FDA-approved test that has been validated and its performance characteristics determined by the reporting laboratory.  This laboratory is certified under the Clinical Laboratory Improvement Amendments CLIA as qualified to perform high complexity clinical laboratory testing.    PIP/TAZO Value in  next row Sensitive      <=4 SENSITIVEThis is a modified FDA-approved test that has been validated and its performance characteristics determined by the reporting laboratory.  This laboratory is certified under the Clinical Laboratory Improvement Amendments CLIA as qualified to perform high complexity clinical laboratory testing.    MEROPENEM Value in next row Sensitive      <=4 SENSITIVEThis is a modified FDA-approved test that has been validated and its performance characteristics determined by the reporting laboratory.  This laboratory is certified under the Clinical Laboratory Improvement Amendments CLIA as qualified to perform high complexity clinical laboratory testing.    * 40,000 COLONIES/mL ESCHERICHIA COLI  Blood Culture ID Panel (Reflexed)     Status: Abnormal   Collection Time: 07/26/24  9:39 AM  Result Value Ref Range Status   Enterococcus faecalis NOT DETECTED NOT DETECTED Final   Enterococcus Faecium NOT DETECTED NOT DETECTED Final   Listeria monocytogenes NOT DETECTED NOT DETECTED Final   Staphylococcus species NOT DETECTED NOT DETECTED Final   Staphylococcus aureus (BCID) NOT DETECTED NOT DETECTED Final   Staphylococcus epidermidis NOT DETECTED NOT DETECTED Final   Staphylococcus lugdunensis NOT DETECTED NOT DETECTED Final   Streptococcus species NOT DETECTED NOT DETECTED Final   Streptococcus agalactiae NOT DETECTED NOT DETECTED Final   Streptococcus pneumoniae NOT DETECTED NOT DETECTED Final   Streptococcus pyogenes NOT DETECTED NOT DETECTED Final   A.calcoaceticus-baumannii NOT DETECTED NOT DETECTED Final   Bacteroides fragilis NOT DETECTED NOT DETECTED Final   Enterobacterales DETECTED (A) NOT DETECTED Final    Comment: Enterobacterales represent a large order of gram negative bacteria, not a single organism. CRITICAL RESULT CALLED TO, READ BACK BY AND VERIFIED WITH: PHARMD M. BELL [id number] AT 0027, ADC    Enterobacter cloacae complex NOT DETECTED NOT DETECTED Final    Escherichia coli DETECTED (A) NOT DETECTED Final    Comment: CRITICAL RESULT CALLED TO, READ BACK BY AND VERIFIED WITH: PHARMD M. BELL [id number] AT 0027, ADC    Klebsiella aerogenes NOT DETECTED NOT DETECTED Final   Klebsiella oxytoca NOT DETECTED NOT DETECTED Final   Klebsiella pneumoniae NOT DETECTED NOT DETECTED Final   Proteus species NOT DETECTED NOT DETECTED Final   Salmonella species NOT DETECTED NOT DETECTED Final   Serratia marcescens NOT DETECTED NOT DETECTED Final   Haemophilus influenzae NOT DETECTED NOT DETECTED Final   Neisseria meningitidis NOT DETECTED NOT DETECTED Final   Pseudomonas aeruginosa NOT DETECTED NOT DETECTED Final   Stenotrophomonas maltophilia NOT  DETECTED NOT DETECTED Final   Candida albicans NOT DETECTED NOT DETECTED Final   Candida auris NOT DETECTED NOT DETECTED Final   Candida glabrata NOT DETECTED NOT DETECTED Final   Candida krusei NOT DETECTED NOT DETECTED Final   Candida parapsilosis NOT DETECTED NOT DETECTED Final   Candida tropicalis NOT DETECTED NOT DETECTED Final   Cryptococcus neoformans/gattii NOT DETECTED NOT DETECTED Final   CTX-M ESBL NOT DETECTED NOT DETECTED Final   Carbapenem resistance IMP NOT DETECTED NOT DETECTED Final   Carbapenem resistance KPC NOT DETECTED NOT DETECTED Final   Carbapenem resistance NDM NOT DETECTED NOT DETECTED Final   Carbapenem resist OXA 48 LIKE NOT DETECTED NOT DETECTED Final   Carbapenem resistance VIM NOT DETECTED NOT DETECTED Final    Comment: Performed at [facility], [address], [place], [place] [address]         Radiology Studies: No results found.       Scheduled Meds:  diclofenac Sodium  2 g Topical QID   donepezil   5 mg Oral Daily   heparin  5,000 Units Subcutaneous Q8H   insulin  aspart  0-5 Units Subcutaneous QHS   insulin  aspart  0-6 Units Subcutaneous TID WC   levothyroxine   50 mcg Oral Q0600   metoprolol  succinate  100 mg Oral Daily   PARoxetine   40 mg Oral q morning    pravastatin   40 mg Oral Daily   Continuous Infusions:  cefTRIAXone (ROCEPHIN)  IV 2 g (07/28/24 1059)   sodium PHOSPHATE IVPB (in mmol) 30 mmol (07/28/24 0840)     LOS: 2 days    Time spent: 52 minutes spent on 07/28/2024 caring for this patient face-to-face including chart review, ordering labs/tests, documenting, discussion with nursing staff, consultants, updating family and interview/physical exam    Harlene RAYMOND Bowl, DO [facility] Available via Epic secure chat 7am-7pm After these hours, please refer to coverage provider listed on [email or web address] 07/28/2024, 11:00 AM

## 2024-07-29 DIAGNOSIS — N12 Tubulo-interstitial nephritis, not specified as acute or chronic: Secondary | ICD-10-CM | POA: Diagnosis not present

## 2024-07-29 LAB — GLUCOSE, CAPILLARY
Glucose-Capillary: 93 mg/dL (ref 70–99)
Glucose-Capillary: 159 mg/dL — ABNORMAL HIGH (ref 70–99)
Glucose-Capillary: 101 mg/dL — ABNORMAL HIGH (ref 70–99)
Glucose-Capillary: 148 mg/dL — ABNORMAL HIGH (ref 70–99)

## 2024-07-29 LAB — CBC
HCT: 25.8 % — ABNORMAL LOW (ref 36.0–46.0)
Hemoglobin: 8.2 g/dL — ABNORMAL LOW (ref 12.0–15.0)
MCH: 27.4 pg (ref 26.0–34.0)
MCHC: 31.8 g/dL (ref 30.0–36.0)
MCV: 86.3 fL (ref 80.0–100.0)
Platelets: 156 K/uL (ref 150–400)
RBC: 2.99 MIL/uL — ABNORMAL LOW (ref 3.87–5.11)
RDW: 14.2 % (ref 11.5–15.5)
WBC: 10.5 K/uL (ref 4.0–10.5)
nRBC: 0 % (ref 0.0–0.2)

## 2024-07-29 LAB — BASIC METABOLIC PANEL WITH GFR
Anion gap: 9 (ref 5–15)
BUN: 16 mg/dL (ref 8–23)
CO2: 22 mmol/L (ref 22–32)
Calcium: 8.4 mg/dL — ABNORMAL LOW (ref 8.9–10.3)
Chloride: 106 mmol/L (ref 98–111)
Creatinine, Ser: 0.79 mg/dL (ref 0.44–1.00)
GFR, Estimated: >60 mL/min (ref >60)
Glucose, Bld: 121 mg/dL — ABNORMAL HIGH (ref 70–99)
Potassium: 3.6 mmol/L (ref 3.5–5.1)
Sodium: 137 mmol/L (ref 135–145)

## 2024-07-29 MED ORDER — OXYCODONE HCL 5 MG PO TABS
2.5000 mg | ORAL_TABLET | Freq: Four times a day (QID) | ORAL | Status: DC | PRN
  Administered 2024-07-29: 2.5 mg via ORAL
  Filled 2024-07-29: qty 1

## 2024-07-29 MED ORDER — SACCHAROMYCES BOULARDII 250 MG PO CAPS
250.0000 mg | ORAL_CAPSULE | Freq: Two times a day (BID) | ORAL | Status: DC
  Administered 2024-07-29 – 2024-08-03 (×11): 250 mg via ORAL
  Filled 2024-07-29 (×11): qty 1

## 2024-07-29 MED ORDER — GABAPENTIN 300 MG PO CAPS
600.0000 mg | ORAL_CAPSULE | Freq: Three times a day (TID) | ORAL | Status: DC
Start: 2024-07-29 — End: 2024-07-30
  Administered 2024-07-29 (×3): 600 mg via ORAL
  Filled 2024-07-29 (×3): qty 2

## 2024-07-29 MED ORDER — CEFADROXIL 500 MG PO CAPS
1000.0000 mg | ORAL_CAPSULE | Freq: Two times a day (BID) | ORAL | Status: DC
  Administered 2024-07-29 – 2024-08-03 (×11): 1000 mg via ORAL
  Filled 2024-07-29 (×12): qty 2

## 2024-07-29 NOTE — Progress Notes (Signed)
 PROGRESS NOTE    Debra [name]  FMW:[id number] DOB: April 12, 1956 DOA: 07/26/2024 PCP: Antonio Cyndee Jamee JONELLE, DO    Brief Narrative:   Debra [name] is a 68 y.o. female with past medical history significant for chronic diastolic congestive heart failure, HTN, DMT2, major depressive disorder, generalized anxiety disorder, hypothyroidism, OSA, cognitive impairment who presented to [facility] ED on 07/26/2024 with generalized pain, nausea/vomiting, lethargy and near syncope.  In the ED, temperature 101.5 F rectally, HR 103, RR 22, BP 91/61, SpO2 94% on room air.  WBC 19.3, hemoglobin 10.8, platelet count 177.  Sodium 134, potassium 3.4, chloride 97, CO2 22, glucose 153, BUN 24, creat 1.36.  AST 28, ALT 16, total bilirubin 1.1.  High-sensitivity troponin less than 15.  Lactic acid 1.5.  COVID/influenza/RSV PCR negative.  Urinalysis with moderate leukocytes, positive nitrite, many bacteria, greater than 50 WBCs.   Assessment & Plan:   Acute metabolic encephalopathy, POA: Improving Patient presenting with confusion, lethargy in the setting of fever, elevated WBC count, urinalysis consistent with urinary tract infection and abdominal imaging consistent with right-sided pyelonephritis.  CT head without contrast with no acute intracranial abnormality, noted chronic cerebral white matter disease stable from MRI prior year, extensive chronic suboccipital cervical spine surgery that is grossly stable since 2007. -- Supportive care, treatment as below -Delirium precautions  Severe sepsis, POA--E. coli bacteremia Urinary tract infection Right pyelonephritis Patient presenting to ED with confusion, generalized pain associate with nausea and vomiting.  Found to be febrile with temperature 101.5 F rectally, tachycardic, tachypneic with elevated WBC count of 19.3 and urinalysis consistent with urinary tract infection.  CT abdomen/pelvis with contrast with abnormal right kidney and ureter consistent  with a standing urinary tract infection/acute pyelonephritis, no other acute or inflammatory process within the abdomen/pelvis. -- WBCs trending down -- Blood culture: E. coli -- Urine culture: E. coli -- Ceftriaxone 2 g IV every 24 hours changed to oral antibiotics as white blood cell count has normalized  Hypokalemia -Replete  Chronic diastolic congestive heart failure, compensated HTN -Resume home meds as able  HLD -- Pravastatin  40 mg p.o. daily  DM2 Currently diet controlled at baseline.  6.5 on 06/22/2024, well-controlled. -- SSI for coverage -- CBGs qAC/HS  Hypothyroidism -- Levothyroxine  50 mcg p.o. daily  Generalized anxiety disorder Major depressive disorder -- Paxil  40 mg p.o. daily  Cognitive impairment Follows with neurology outpatient.  On donepezil . --Continue donepezil  5 mg p.o. daily --Delirium precautions  OSA -- Nocturnal CPAP  Weakness/debility/deconditioning: -- PT/OT evaluation   DVT prophylaxis: heparin injection 5,000 Units Start: 07/26/24 1700    Code Status: Full Code Family Communication: No family present at bedside this morning  Disposition Plan:  Level of care: Med-Surg Status is: Inpatient Remains inpatient appropriate because: Await SNF placement    Consultants:  None   Subjective: Overall feeling better  Objective: Vitals:   07/28/24 1107 07/28/24 1313 07/28/24 2019 07/29/24 0412  BP: 120/79 129/77 124/78 119/68  Pulse: 98 87 86 70  Resp:  16 18 16   Temp:  (!) 97.5 F (36.4 C) 97.7 F (36.5 C) 98.5 F (36.9 C)  TempSrc:  Oral Oral Oral  SpO2:  94% 94% 93%  Weight:      Height:        Intake/Output Summary (Last 24 hours) at 07/29/2024 1155 Last data filed at 07/29/2024 0949 Gross per 24 hour  Intake 1201.62 ml  Output 425 ml  Net 776.62 ml   [employer]  07/26/24 1846  Weight: 70.1 kg    Examination:  Physical Exam:  General: Appearance:     Overweight female in no acute distress     Lungs:      respirations unlabored  Heart:    Normal heart rate. .   MS:   All extremities are intact.   Neurologic:   Awake, alert       Data Reviewed: I have personally reviewed following labs and imaging studies  CBC: Recent Labs  Lab 07/26/24 0729 07/27/24 0440 07/28/24 0416 07/29/24 0356  WBC 19.3* 19.1* 19.1* 10.5  NEUTROABS 16.5*  --   --   --   HGB 10.8* 8.5* 9.2* 8.2*  HCT 31.4* 25.8* 29.4* 25.8*  MCV 83.7 86.3 87.5 86.3  PLT 177 148* 158 156   Basic Metabolic Panel: Recent Labs  Lab 07/26/24 0729 07/27/24 0440 07/28/24 0416 07/29/24 0356  NA 134* 138 137 137  K 3.4* 3.0* 4.0 3.6  CL 97* 105 106 106  CO2 22 21* 21* 22  GLUCOSE 153* 120* 103* 121*  BUN 24* 24* 21 16  CREATININE 1.36* 1.03* 0.92 0.79  CALCIUM  9.5 8.6* 8.7* 8.4*  MG  --  1.4* 2.3  --   PHOS  --   --  1.4*  --    GFR: Estimated Creatinine Clearance: 60.9 mL/min (by C-G formula based on SCr of 0.79 mg/dL). Liver Function Tests: Recent Labs  Lab 07/26/24 0729  AST 28  ALT 16  ALKPHOS 100  BILITOT 1.1  PROT 7.7  ALBUMIN 3.8   No results for input(s): LIPASE, AMYLASE in the last 168 hours. No results for input(s): AMMONIA in the last 168 hours. Coagulation Profile: No results for input(s): INR, PROTIME in the last 168 hours. Cardiac Enzymes: No results for input(s): CKTOTAL, CKMB, CKMBINDEX, TROPONINI in the last 168 hours. BNP (last 3 results) No results for input(s): PROBNP in the last 8760 hours. HbA1C: No results for input(s): HGBA1C in the last 72 hours. CBG: Recent Labs  Lab 07/28/24 1124 07/28/24 1610 07/28/24 2058 07/29/24 0738 07/29/24 1125  GLUCAP 173* 102* 140* 93 159*   Lipid Profile: No results for input(s): CHOL, HDL, LDLCALC, TRIG, CHOLHDL, LDLDIRECT in the last 72 hours. Thyroid  Function Tests: No results for input(s): TSH, T4TOTAL, FREET4, T3FREE, THYROIDAB in the last 72 hours. Anemia Panel: No results for input(s):  VITAMINB12, FOLATE, FERRITIN, TIBC, IRON, RETICCTPCT in the last 72 hours. Sepsis Labs: Recent Labs  Lab 07/26/24 0729 07/26/24 1211 07/26/24 1655  LATICACIDVEN 1.5 1.7 1.8    Recent Results (from the past 240 hours)  Resp panel by RT-PCR (RSV, Flu A&B, Covid) Anterior Nasal Swab     Status: None   Collection Time: 07/26/24  7:51 AM   Specimen: Anterior Nasal Swab  Result Value Ref Range Status   SARS Coronavirus 2 by RT PCR NEGATIVE NEGATIVE Final    Comment: (NOTE) SARS-CoV-2 target nucleic acids are NOT DETECTED.  The SARS-CoV-2 RNA is generally detectable in upper respiratory specimens during the acute phase of infection. The lowest concentration of SARS-CoV-2 viral copies this assay can detect is 138 copies/mL. A negative result does not preclude SARS-Cov-2 infection and should not be used as the sole basis for treatment or other patient management decisions. A negative result may occur with  improper specimen collection/handling, submission of specimen other than nasopharyngeal swab, presence of viral mutation(s) within the areas targeted by this assay, and inadequate number of viral copies(<138 copies/mL). A negative result  must be combined with clinical observations, patient history, and epidemiological information. The expected result is Negative.  Fact Sheet for Patients:  BloggerCourse.com  Fact Sheet for Healthcare Providers:  SeriousBroker.it  This test is no t yet approved or cleared by the [place]  FDA and  has been authorized for detection and/or diagnosis of SARS-CoV-2 by FDA under an Emergency Use Authorization (EUA). This EUA will remain  in effect (meaning this test can be used) for the duration of the COVID-19 declaration under Section 564(b)(1) of the Act, 21 U.S.C.section 360bbb-3(b)(1), unless the authorization is terminated  or revoked sooner.       Influenza A by PCR NEGATIVE  NEGATIVE Final   Influenza B by PCR NEGATIVE NEGATIVE Final    Comment: (NOTE) The Xpert Xpress SARS-CoV-2/FLU/RSV plus assay is intended as an aid in the diagnosis of influenza from Nasopharyngeal swab specimens and should not be used as a sole basis for treatment. Nasal washings and aspirates are unacceptable for Xpert Xpress SARS-CoV-2/FLU/RSV testing.  Fact Sheet for Patients: BloggerCourse.com  Fact Sheet for Healthcare Providers: SeriousBroker.it  This test is not yet approved or cleared by the [place]  FDA and has been authorized for detection and/or diagnosis of SARS-CoV-2 by FDA under an Emergency Use Authorization (EUA). This EUA will remain in effect (meaning this test can be used) for the duration of the COVID-19 declaration under Section 564(b)(1) of the Act, 21 U.S.C. section 360bbb-3(b)(1), unless the authorization is terminated or revoked.     Resp Syncytial Virus by PCR NEGATIVE NEGATIVE Final    Comment: (NOTE) Fact Sheet for Patients: BloggerCourse.com  Fact Sheet for Healthcare Providers: SeriousBroker.it  This test is not yet approved or cleared by the [place]  FDA and has been authorized for detection and/or diagnosis of SARS-CoV-2 by FDA under an Emergency Use Authorization (EUA). This EUA will remain in effect (meaning this test can be used) for the duration of the COVID-19 declaration under Section 564(b)(1) of the Act, 21 U.S.C. section 360bbb-3(b)(1), unless the authorization is terminated or revoked.  Performed at [facility], [address] [place], [place] [address]   Culture, blood (single)     Status: Abnormal   Collection Time: 07/26/24  9:39 AM   Specimen: BLOOD RIGHT FOREARM  Result Value Ref Range Status   Specimen Description BLOOD RIGHT FOREARM  Final   Special Requests   Final    BOTTLES DRAWN AEROBIC AND  ANAEROBIC Blood Culture adequate volume   Culture  Setup Time   Final    GRAM NEGATIVE RODS IN BOTH AEROBIC AND ANAEROBIC BOTTLES CRITICAL RESULT CALLED TO, READ BACK BY AND VERIFIED WITH: PHARMD M. BELL [id number] AT 0027, ADC Performed at [facility], [address] [place], [place] [address]    Culture ESCHERICHIA COLI (A)  Final   Report Status 07/28/2024 FINAL  Final   Organism ID, Bacteria ESCHERICHIA COLI  Final      Susceptibility   Escherichia coli - MIC*    AMPICILLIN <=2 SENSITIVE Sensitive     CEFAZOLIN (NON-URINE) <=1 SENSITIVE Sensitive     CEFEPIME <=0.12 SENSITIVE Sensitive     ERTAPENEM <=0.12 SENSITIVE Sensitive     CEFTRIAXONE <=0.25 SENSITIVE Sensitive     CIPROFLOXACIN  <=0.06 SENSITIVE Sensitive     GENTAMICIN <=1 SENSITIVE Sensitive     MEROPENEM <=0.25 SENSITIVE Sensitive     TRIMETH /SULFA  <=20 SENSITIVE Sensitive     AMPICILLIN/SULBACTAM <=2 SENSITIVE Sensitive     PIP/TAZO  Value in next row Sensitive      <=4 SENSITIVEThis is a modified FDA-approved test that has been validated and its performance characteristics determined by the reporting laboratory.  This laboratory is certified under the Clinical Laboratory Improvement Amendments CLIA as qualified to perform high complexity clinical laboratory testing.    * ESCHERICHIA COLI  Urine Culture     Status: Abnormal   Collection Time: 07/26/24  9:39 AM   Specimen: Urine, Random  Result Value Ref Range Status   Specimen Description   Final    URINE, RANDOM Performed at [facility], [address] [place], [place] [address]    Special Requests   Final    NONE Reflexed from [id number] Performed at [facility], [address] [place], [place] [address]    Culture (A)  Final    40,000 COLONIES/mL ESCHERICHIA COLI Two isolates with different morphologies were identified as the same organism.The most resistant organism was reported. Performed at [facility], [address],  [place], [place] [address]    Report Status 07/28/2024 FINAL  Final   Organism ID, Bacteria ESCHERICHIA COLI (A)  Final      Susceptibility   Escherichia coli - MIC*    AMPICILLIN RESISTANT Resistant     CEFAZOLIN (URINE) Value in next row Resistant      >=32 RESISTANTThis is a modified FDA-approved test that has been validated and its performance characteristics determined by the reporting laboratory.  This laboratory is certified under the Clinical Laboratory Improvement Amendments CLIA as qualified to perform high complexity clinical laboratory testing.    CEFEPIME Value in next row Sensitive      >=32 RESISTANTThis is a modified FDA-approved test that has been validated and its performance characteristics determined by the reporting laboratory.  This laboratory is certified under the Clinical Laboratory Improvement Amendments CLIA as qualified to perform high complexity clinical laboratory testing.    ERTAPENEM Value in next row Sensitive      >=32 RESISTANTThis is a modified FDA-approved test that has been validated and its performance characteristics determined by the reporting laboratory.  This laboratory is certified under the Clinical Laboratory Improvement Amendments CLIA as qualified to perform high complexity clinical laboratory testing.    CEFTRIAXONE Value in next row Sensitive      >=32 RESISTANTThis is a modified FDA-approved test that has been validated and its performance characteristics determined by the reporting laboratory.  This laboratory is certified under the Clinical Laboratory Improvement Amendments CLIA as qualified to perform high complexity clinical laboratory testing.    CIPROFLOXACIN  Value in next row Sensitive      >=32 RESISTANTThis is a modified FDA-approved test that has been validated and its performance characteristics determined by the reporting laboratory.  This laboratory is certified under the Clinical Laboratory Improvement Amendments CLIA as qualified to perform  high complexity clinical laboratory testing.    GENTAMICIN Value in next row Sensitive      >=32 RESISTANTThis is a modified FDA-approved test that has been validated and its performance characteristics determined by the reporting laboratory.  This laboratory is certified under the Clinical Laboratory Improvement Amendments CLIA as qualified to perform high complexity clinical laboratory testing.    NITROFURANTOIN Value in next row Sensitive      >=32 RESISTANTThis is a modified FDA-approved test that has been validated and its performance characteristics determined by the reporting laboratory.  This laboratory is certified under the Clinical Laboratory Improvement Amendments CLIA  as qualified to perform high complexity clinical laboratory testing.    TRIMETH /SULFA  Value in next row Sensitive      >=32 RESISTANTThis is a modified FDA-approved test that has been validated and its performance characteristics determined by the reporting laboratory.  This laboratory is certified under the Clinical Laboratory Improvement Amendments CLIA as qualified to perform high complexity clinical laboratory testing.    AMPICILLIN/SULBACTAM Value in next row Sensitive      >=32 RESISTANTThis is a modified FDA-approved test that has been validated and its performance characteristics determined by the reporting laboratory.  This laboratory is certified under the Clinical Laboratory Improvement Amendments CLIA as qualified to perform high complexity clinical laboratory testing.    PIP/TAZO Value in next row Sensitive      <=4 SENSITIVEThis is a modified FDA-approved test that has been validated and its performance characteristics determined by the reporting laboratory.  This laboratory is certified under the Clinical Laboratory Improvement Amendments CLIA as qualified to perform high complexity clinical laboratory testing.    MEROPENEM Value in next row Sensitive      <=4 SENSITIVEThis is a modified FDA-approved test that  has been validated and its performance characteristics determined by the reporting laboratory.  This laboratory is certified under the Clinical Laboratory Improvement Amendments CLIA as qualified to perform high complexity clinical laboratory testing.    * 40,000 COLONIES/mL ESCHERICHIA COLI  Blood Culture ID Panel (Reflexed)     Status: Abnormal   Collection Time: 07/26/24  9:39 AM  Result Value Ref Range Status   Enterococcus faecalis NOT DETECTED NOT DETECTED Final   Enterococcus Faecium NOT DETECTED NOT DETECTED Final   Listeria monocytogenes NOT DETECTED NOT DETECTED Final   Staphylococcus species NOT DETECTED NOT DETECTED Final   Staphylococcus aureus (BCID) NOT DETECTED NOT DETECTED Final   Staphylococcus epidermidis NOT DETECTED NOT DETECTED Final   Staphylococcus lugdunensis NOT DETECTED NOT DETECTED Final   Streptococcus species NOT DETECTED NOT DETECTED Final   Streptococcus agalactiae NOT DETECTED NOT DETECTED Final   Streptococcus pneumoniae NOT DETECTED NOT DETECTED Final   Streptococcus pyogenes NOT DETECTED NOT DETECTED Final   A.calcoaceticus-baumannii NOT DETECTED NOT DETECTED Final   Bacteroides fragilis NOT DETECTED NOT DETECTED Final   Enterobacterales DETECTED (A) NOT DETECTED Final    Comment: Enterobacterales represent a large order of gram negative bacteria, not a single organism. CRITICAL RESULT CALLED TO, READ BACK BY AND VERIFIED WITH: PHARMD M. BELL [id number] AT 0027, ADC    Enterobacter cloacae complex NOT DETECTED NOT DETECTED Final   Escherichia coli DETECTED (A) NOT DETECTED Final    Comment: CRITICAL RESULT CALLED TO, READ BACK BY AND VERIFIED WITH: PHARMD M. BELL [id number] AT 0027, ADC    Klebsiella aerogenes NOT DETECTED NOT DETECTED Final   Klebsiella oxytoca NOT DETECTED NOT DETECTED Final   Klebsiella pneumoniae NOT DETECTED NOT DETECTED Final   Proteus species NOT DETECTED NOT DETECTED Final   Salmonella species NOT DETECTED NOT DETECTED Final    Serratia marcescens NOT DETECTED NOT DETECTED Final   Haemophilus influenzae NOT DETECTED NOT DETECTED Final   Neisseria meningitidis NOT DETECTED NOT DETECTED Final   Pseudomonas aeruginosa NOT DETECTED NOT DETECTED Final   Stenotrophomonas maltophilia NOT DETECTED NOT DETECTED Final   Candida albicans NOT DETECTED NOT DETECTED Final   Candida auris NOT DETECTED NOT DETECTED Final   Candida glabrata NOT DETECTED NOT DETECTED Final   Candida krusei NOT DETECTED NOT DETECTED Final   Candida parapsilosis NOT DETECTED NOT DETECTED  Final   Candida tropicalis NOT DETECTED NOT DETECTED Final   Cryptococcus neoformans/gattii NOT DETECTED NOT DETECTED Final   CTX-M ESBL NOT DETECTED NOT DETECTED Final   Carbapenem resistance IMP NOT DETECTED NOT DETECTED Final   Carbapenem resistance KPC NOT DETECTED NOT DETECTED Final   Carbapenem resistance NDM NOT DETECTED NOT DETECTED Final   Carbapenem resist OXA 48 LIKE NOT DETECTED NOT DETECTED Final   Carbapenem resistance VIM NOT DETECTED NOT DETECTED Final    Comment: Performed at [facility], [address] [place], [place] [address]         Radiology Studies: No results found.       Scheduled Meds:  cefadroxil  1,000 mg Oral BID   diclofenac Sodium  2 g Topical QID   donepezil   5 mg Oral Daily   gabapentin   600 mg Oral TID   heparin  5,000 Units Subcutaneous Q8H   insulin  aspart  0-5 Units Subcutaneous QHS   insulin  aspart  0-6 Units Subcutaneous TID WC   levothyroxine   50 mcg Oral Q0600   metoprolol  succinate  100 mg Oral Daily   PARoxetine   40 mg Oral q morning   pravastatin   40 mg Oral Daily   saccharomyces boulardii  250 mg Oral BID   Continuous Infusions:     LOS: 3 days    Time spent: 52 minutes spent on 07/29/2024 caring for this patient face-to-face including chart review, ordering labs/tests, documenting, discussion with nursing staff, consultants, updating family and interview/physical exam    Harlene RAYMOND Bowl, DO [facility] Available via Epic secure chat 7am-7pm After these hours, please refer to coverage provider listed on [email or web address] 07/29/2024, 11:55 AM

## 2024-07-29 NOTE — Plan of Care (Signed)
  Problem: Clinical Measurements: Goal: Respiratory complications will improve Outcome: Progressing   Problem: Clinical Measurements: Goal: Cardiovascular complication will be avoided Outcome: Progressing   Problem: Elimination: Goal: Will not experience complications related to bowel motility Outcome: Progressing   Problem: Elimination: Goal: Will not experience complications related to urinary retention Outcome: Progressing

## 2024-07-30 DIAGNOSIS — N12 Tubulo-interstitial nephritis, not specified as acute or chronic: Secondary | ICD-10-CM | POA: Diagnosis not present

## 2024-07-30 LAB — BASIC METABOLIC PANEL WITH GFR
Anion gap: 11 (ref 5–15)
BUN: 12 mg/dL (ref 8–23)
CO2: 22 mmol/L (ref 22–32)
Calcium: 8.5 mg/dL — ABNORMAL LOW (ref 8.9–10.3)
Chloride: 108 mmol/L (ref 98–111)
Creatinine, Ser: 0.71 mg/dL (ref 0.44–1.00)
GFR, Estimated: >60 mL/min (ref >60)
Glucose, Bld: 112 mg/dL — ABNORMAL HIGH (ref 70–99)
Potassium: 3.3 mmol/L — ABNORMAL LOW (ref 3.5–5.1)
Sodium: 140 mmol/L (ref 135–145)

## 2024-07-30 LAB — GLUCOSE, CAPILLARY
Glucose-Capillary: 104 mg/dL — ABNORMAL HIGH (ref 70–99)
Glucose-Capillary: 130 mg/dL — ABNORMAL HIGH (ref 70–99)
Glucose-Capillary: 145 mg/dL — ABNORMAL HIGH (ref 70–99)
Glucose-Capillary: 154 mg/dL — ABNORMAL HIGH (ref 70–99)

## 2024-07-30 MED ORDER — GABAPENTIN 100 MG PO CAPS
200.0000 mg | ORAL_CAPSULE | Freq: Three times a day (TID) | ORAL | Status: DC
Start: 2024-07-30 — End: 2024-07-30
  Administered 2024-07-30: 200 mg via ORAL
  Filled 2024-07-30: qty 2

## 2024-07-30 MED ORDER — POTASSIUM CHLORIDE CRYS ER 20 MEQ PO TBCR
40.0000 meq | EXTENDED_RELEASE_TABLET | Freq: Once | ORAL | Status: AC
  Administered 2024-07-30: 40 meq via ORAL
  Filled 2024-07-30: qty 2

## 2024-07-30 MED ORDER — GABAPENTIN 400 MG PO CAPS
400.0000 mg | ORAL_CAPSULE | Freq: Every day | ORAL | Status: DC
Start: 2024-07-30 — End: 2024-08-02
  Administered 2024-07-30 – 2024-08-01 (×3): 400 mg via ORAL
  Filled 2024-07-30 (×4): qty 1

## 2024-07-30 NOTE — TOC Progression Note (Signed)
 Transition of Care [facility]) - Progression Note    Patient Details  Name: Debra [name] MRN: [id number] Date of Birth: 1956/06/17  [facility] CM/SW Contact  Sonda Manuella Quill, RN Phone Number: 07/30/2024, 10:15 AM  Clinical Narrative:    PT/OT recc SNF; spoke w/ pt, sister [name] [phone]) and brother Francis Lever  [phone]) on speaker phone; they agree to recc; explained SNF process; they verbalized understanding and do not have a facility preference; they request search be limited to [facility]; received PASRR # [id number]; FL2 sent for cosign; faxed out via SNF hub; awaiting bed offers; ins auth.   Expected Discharge Plan:  (TBD) Barriers to Discharge: Continued Medical Work up               Expected Discharge Plan and Services In-house Referral: Clinical Social Work     Living arrangements for the past 2 months: Single Family Home                                       Social Drivers of Health (SDOH) Interventions SDOH Screenings   Food Insecurity: No Food Insecurity (07/26/2024)  Housing: Low Risk  (07/26/2024)  Transportation Needs: No Transportation Needs (07/27/2024)  Utilities: Not At Risk (07/26/2024)  Alcohol Screen: Low Risk  (01/06/2024)  Depression (PHQ2-9): High Risk (03/17/2024)  Financial Resource Strain: Low Risk  (01/06/2024)  Physical Activity: Inactive (01/06/2024)  Social Connections: Moderately Isolated (07/26/2024)  Stress: Stress Concern Present (01/06/2024)  Tobacco Use: High Risk (07/26/2024)  Health Literacy: Adequate Health Literacy (01/06/2024)    Readmission Risk Interventions     No data to display

## 2024-07-30 NOTE — Progress Notes (Signed)
 Sister Luke is at bedside. Reviewed labs and last vital signs. Provided patient education from [facility] regarding E. Coli infection, bacteremia, pyelonephritis, and Cefadroxil. Sister verbalized understanding and gratitude for patient education material. Also, explained that bed offers and insurance auth were pending per case manager notes as sister wanted to know if patient had a date of discharge.

## 2024-07-30 NOTE — Progress Notes (Signed)
 Physical Therapy Treatment Patient Details Name: Debra [name] MRN: [id number] DOB: Mar 12, 1956 Today's Date: 07/30/2024   History of Present Illness Patient is a 68 yo female presenting to the ED with confusion, lethargy, chest and abdominal pain and near syncopal episode on 07/26/24. CT head clear. Admitted with pyelonephritis and sepsis. PMH includes: chronic diastolic congestive heart failure, HTN, DMT2, major depressive disorder, generalized anxiety disorder, hypothyroidism, OSA, cognitive impairment    PT Comments  AxO x 2.5 improving but still present with impaired safety awarness, problem solving and multi tasking.  Easily distracted and at times required repeat directions. At times frustrated with word finding.  Prior Pt was home alone, IND, Driving and shopping.  Assisted OOB to amb to the bathroom present with balance impairments.  General transfer comment: impulsive.  Impaired self safety awareness.  Required assist for balance correction with multitasking while in bathroom performing standing peri care and donning/doffing under pants.  HIGH FALL RISK as Pt presented with delayed self correction and impaired self awareness of midline.  Drunken movements. General Gait Details: Very unsteady gait present with staggering right/left and need to steady self using hall rail.  Poor/delayed self correction response.  HIGH FALL RISK.  Impulsive with impaired self safety awareness.  Increased balance instability with turns and back steps.  I feel a little off, stated Pt. BERG balance test score 32/56 indicating 100% fall risk.  LPT has rec Pt will need ST Rehab at SNF to address balance, mobility and functional decline prior to safely returning home alone.    If plan is discharge home, recommend the following: Supervision due to cognitive status;Help with stairs or ramp for entrance;Assist for transportation;Direct supervision/assist for financial management;Direct supervision/assist for  medications management;Assistance with cooking/housework;A little help with bathing/dressing/bathroom;A little help with walking and/or transfers   Can travel by private vehicle     Yes  Equipment Recommendations  None recommended by PT    Recommendations for Other Services       Precautions / Restrictions Precautions Precautions: Fall Restrictions Weight Bearing Restrictions Per Provider Order: No     Mobility  Bed Mobility Overal bed mobility: Modified Independent             General bed mobility comments: Pt self able to perform all bed mobility.  Slightly impulsive.    Transfers Overall transfer level: Needs assistance Equipment used: 1 person hand held assist Transfers: Sit to/from Stand, Bed to chair/wheelchair/BSC Sit to Stand: Supervision, Contact guard assist Stand pivot transfers: Contact guard assist, Min assist         General transfer comment: impulsive.  Impaired self safety awareness.  Required assist for balance correction with multitasking while in bathroom performing standing peri care and donning/doffing under pants.  HIGH FALL RISK as Pt presented with delayed self correction and impaired self awareness of midline.  Drunken movements.    Ambulation/Gait Ambulation/Gait assistance: Min assist, Mod assist Gait Distance (Feet): 65 Feet Assistive device: None Gait Pattern/deviations: Step-through pattern, Decreased stride length Gait velocity: decreased     General Gait Details: Very unsteady gait present with staggering right/left and need to steady self using hall rail.  Poor/delayed self correction response.  HIGH FALL RISK.  Impulsive with impaired self safety awareness.  Increased balance instability with turns and back steps.  I feel a little off, stated Pt.   Stairs             Wheelchair Mobility     Tilt Bed    Modified  Rankin (Stroke Patients Only)       Balance Overall balance assessment: Needs assistance                                Standardized Balance Assessment Standardized Balance Assessment : Berg Balance Test Berg Balance Test Sit to Stand: Able to stand without using hands and stabilize independently Standing Unsupported: Able to stand 2 minutes with supervision Sitting with Back Unsupported but Feet Supported on Floor or Stool: Able to sit safely and securely 2 minutes Stand to Sit: Uses backs of legs against chair to control descent Transfers: Able to transfer with verbal cueing and /or supervision Standing Unsupported with Eyes Closed: Able to stand 10 seconds with supervision Standing Ubsupported with Feet Together: Needs help to attain position but able to stand for 30 seconds with feet together From Standing, Reach Forward with Outstretched Arm: Can reach forward >12 cm safely (5) From Standing Position, Pick up Object from Floor: Able to pick up shoe, needs supervision From Standing Position, Turn to Look Behind Over each Shoulder: Looks behind one side only/other side shows less weight shift Turn 360 Degrees: Able to turn 360 degrees safely one side only in 4 seconds or less Standing Unsupported, Alternately Place Feet on Step/Stool: Able to complete >2 steps/needs minimal assist Standing Unsupported, One Foot in Front: Loses balance while stepping or standing Standing on One Leg: Unable to try or needs assist to prevent fall Total Score: 32        Communication Communication Communication: No apparent difficulties  Cognition Arousal: Alert Behavior During Therapy: Impulsive, Restless   PT - Cognitive impairments: Problem solving, Safety/Judgement                       PT - Cognition Comments: AxO x 2.5 improving but still present with impaired safety awarness, problem solving and multi tasking.  Easily distracted and at times required repeat directions. At times frustrated with word finding.  Prior Pt was home alone, IND, Driving and  shopping. Following commands: Impaired Following commands impaired: Follows multi-step commands inconsistently, Follows one step commands inconsistently    Cueing Cueing Techniques: Gestural cues, Verbal cues, Tactile cues  Exercises      General Comments General comments (skin integrity, edema, etc.): BERG score 32/56 indicating 100% fall risk      Pertinent Vitals/Pain Pain Assessment Pain Assessment: Faces Faces Pain Scale: Hurts a little bit Pain Location: general stiffness all over Pain Intervention(s): Monitored during session    Home Living                          Prior Function            PT Goals (current goals can now be found in the care plan section) Progress towards PT goals: Progressing toward goals    Frequency    Min 2X/week      PT Plan      Co-evaluation              AM-PAC PT 6 Clicks Mobility   Outcome Measure  Help needed turning from your back to your side while in a flat bed without using bedrails?: None Help needed moving from lying on your back to sitting on the side of a flat bed without using bedrails?: None Help needed moving to and from a bed to a chair (including a  wheelchair)?: A Little Help needed standing up from a chair using your arms (e.g., wheelchair or bedside chair)?: A Little Help needed to walk in hospital room?: A Little Help needed climbing 3-5 steps with a railing? : A Lot 6 Click Score: 19    End of Session Equipment Utilized During Treatment: Gait belt Activity Tolerance: Patient limited by fatigue Patient left: in bed;with bed alarm set;with call bell/phone within reach;with family/visitor present Nurse Communication: Mobility status PT Visit Diagnosis: Unsteadiness on feet (R26.81);Muscle weakness (generalized) (M62.81)     Time: 8389-8364 PT Time Calculation (min) (ACUTE ONLY): 25 min  Charges:    $Gait Training: 8-22 mins $Therapeutic Activity: 8-22 mins PT General Charges $$  ACUTE PT VISIT: 1 Visit                    Katheryn Leap  PTA Acute  Rehabilitation Services Office M-F          [phone]

## 2024-07-30 NOTE — Plan of Care (Signed)
   Problem: Clinical Measurements: Goal: Will remain free from infection Outcome: Progressing   Problem: Clinical Measurements: Goal: Diagnostic test results will improve Outcome: Progressing   Problem: Clinical Measurements: Goal: Respiratory complications will improve Outcome: Progressing   Problem: Clinical Measurements: Goal: Cardiovascular complication will be avoided Outcome: Progressing

## 2024-07-30 NOTE — NC FL2 (Signed)
 [place]  MEDICAID FL2 LEVEL OF CARE FORM     IDENTIFICATION  Patient Name: Debra [name] Birthdate: [birth year] Sex: female Admission Date (Current Location): 07/26/2024  [facility] and [place] Number:  Producer, television/film/video and Address:  [facility],  [address], [place] [address]      Provider Number: [id number]  Attending Physician Name and Address:  Juvenal Harlene PENNER, DO  Relative Name and Phone Number:  [name] (sister) [phone]    Current Level of Care: Hospital Recommended Level of Care: Skilled Nursing Facility Prior Approval Number:    Date Approved/Denied:   PASRR Number: [id number]  Discharge Plan: SNF    Current Diagnoses: Patient Active Problem List   Diagnosis Date Noted   Pyelonephritis 07/26/2024   Overactive bladder 05/28/2024   Chronic idiopathic constipation 05/28/2024   Sacrodynia 04/15/2024   Pelvic mass 04/15/2024   Expressive language impairment 04/13/2024   Type 2 diabetes mellitus with hyperglycemia, without long-term current use of insulin  08/09/2022   Snapping lateral band due to ligament laxity 02/19/2022   Polyuria 01/01/2022   SUI (stress urinary incontinence, female) 01/01/2022   Trigger thumb, right thumb 09/13/2021   Pruritic dermatitis 05/25/2021   Balance disorder 05/25/2021   Dizziness 05/25/2021   Mild cognitive impairment of uncertain or unknown etiology 01/30/2021   Osteoarthritis    Vitamin D  deficiency    Urinary frequency 10/31/2020   Chronic pain of both shoulders 10/31/2020   Female pattern baldness 10/06/2020   Myalgia 10/06/2020   Urge incontinence of urine 10/06/2020   Allergies 02/03/2019   Acute vaginitis 04/22/2018   Morbid obesity 04/22/2018   Generalized anxiety disorder 04/22/2018   Obstructive sleep apnea 01/30/2018   Major depressive disorder 01/30/2018   Pansinusitis 01/30/2018   Aortic atherosclerosis 10/10/2017   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   Hyperlipidemia 04/17/2017    Anemia, iron deficiency 04/17/2017   Atypical chest pain 08/31/2014   Arm weakness 08/31/2014   Hearing loss 07/24/2012   Hypothyroidism 11/02/2010   Essential hypertension 08/31/2010   Hyperglycemia, fasting 05/04/2010   Diastolic dysfunction 02/15/2010   Back pain with radiculopathy 01/25/2010   Fatigue 01/25/2010   Tobacco use 12/01/2008   Restless legs syndrome 12/01/2008   Leg pain, bilateral 11/17/2008   Palpitations 02/12/2008   GERD (gastroesophageal reflux disease) 07/16/2007   Hiatal hernia 07/16/2007   Knee pain 07/16/2007   Low back pain 07/16/2007   Peripheral vertigo 04/30/2007   Insomnia 04/30/2007   Abdominal pain, right upper quadrant 04/30/2007    Orientation RESPIRATION BLADDER Height & Weight     Self, Situation  Normal External catheter Weight: 70.1 kg Height:  5' 2 (157.5 cm)  BEHAVIORAL SYMPTOMS/MOOD NEUROLOGICAL BOWEL NUTRITION STATUS      Continent Diet (carbohydrate modified, 1600-2000 calories)  AMBULATORY STATUS COMMUNICATION OF NEEDS Skin   Limited Assist Verbally Normal                       Personal Care Assistance Level of Assistance  Bathing, Feeding, Dressing Bathing Assistance: Limited assistance Feeding assistance: Independent Dressing Assistance: Limited assistance     Functional Limitations Info  Sight, Hearing, Speech Sight Info: Adequate Hearing Info: Adequate Speech Info: Adequate    SPECIAL CARE FACTORS FREQUENCY  PT (By licensed PT), OT (By licensed OT)     PT Frequency: 5x/week OT Frequency: 5x/week            Contractures Contractures Info: Present    Additional Factors Info  Code Status, Allergies, Insulin  Sliding Scale Code Status Info: Full Code Allergies Info: Penicillins, Codeine   Insulin  Sliding Scale Info: See MAR       Current Medications (07/30/2024):  This is the current hospital active medication list Current Facility-Administered Medications  Medication Dose Route Frequency Provider  Last Rate Last Admin   acetaminophen  (TYLENOL ) tablet 650 mg  650 mg Oral Q6H PRN Zella, Mir M, MD   650 mg at 07/27/24 9766   Or   acetaminophen  (TYLENOL ) suppository 650 mg  650 mg Rectal Q6H PRN Zella, Mir M, MD       albuterol (PROVENTIL) (2.5 MG/3ML) 0.083% nebulizer solution 2.5 mg  2.5 mg Nebulization Q2H PRN Zella, Mir M, MD       cefadroxil (DURICEF) capsule 1,000 mg  1,000 mg Oral BID Vann, Jessica U, DO   1,000 mg at 07/30/24 1024   diclofenac Sodium (VOLTAREN) 1 % topical gel 2 g  2 g Topical QID Vann, Jessica U, DO   2 g at 07/30/24 1027   donepezil  (ARICEPT ) tablet 5 mg  5 mg Oral Daily Ikramullah, Mir M, MD   5 mg at 07/30/24 1024   gabapentin  (NEURONTIN ) capsule 200 mg  200 mg Oral TID Vann, Jessica U, DO   200 mg at 07/30/24 1025   heparin injection 5,000 Units  5,000 Units Subcutaneous Q8H Zella, Mir M, MD   5,000 Units at 07/30/24 9353   hydrALAZINE (APRESOLINE) tablet 10 mg  10 mg Oral Q8H PRN [place], Camellia PARAS, DO       insulin  aspart (novoLOG) injection 0-5 Units  0-5 Units Subcutaneous QHS Zella, Mir M, MD       insulin  aspart (novoLOG) injection 0-6 Units  0-6 Units Subcutaneous TID WC [place], Eric J, DO   1 Units at 07/29/24 1331   levothyroxine  (SYNTHROID ) tablet 50 mcg  50 mcg Oral Q0600 Zella Katha HERO, MD   50 mcg at 07/30/24 9353   metoprolol  succinate (TOPROL -XL) 24 hr tablet 100 mg  100 mg Oral Daily Vann, Jessica U, DO   100 mg at 07/30/24 1025   ondansetron  (ZOFRAN ) tablet 4 mg  4 mg Oral Q6H PRN Zella, Mir M, MD       Or   ondansetron  (ZOFRAN ) injection 4 mg  4 mg Intravenous Q6H PRN Zella, Mir M, MD   4 mg at 07/27/24 2038   PARoxetine  (PAXIL ) tablet 40 mg  40 mg Oral q morning Zella, Mir M, MD   40 mg at 07/30/24 1024   pravastatin  (PRAVACHOL ) tablet 40 mg  40 mg Oral Daily Ikramullah, Mir M, MD   40 mg at 07/30/24 1025   saccharomyces boulardii (FLORASTOR) capsule 250 mg  250 mg Oral BID Vann, Jessica U, DO   250 mg at  07/30/24 1027   traZODone  (DESYREL ) tablet 25 mg  25 mg Oral QHS PRN Zella Katha HERO, MD   25 mg at 07/27/24 2039     Discharge Medications: Please see discharge summary for a list of discharge medications.  Relevant Imaging Results:  Relevant Lab Results:   Additional Information SSN [id number]  Sonda Manuella Quill, RN

## 2024-07-30 NOTE — Progress Notes (Addendum)
 PROGRESS NOTE    Debra [name]  FMW:[id number] DOB: [birth year] DOA: 07/26/2024 PCP: Antonio Cyndee Jamee JONELLE, DO    Brief Narrative:   Debra [name] is a 68 y.o. female with past medical history significant for chronic diastolic congestive heart failure, HTN, DMT2, major depressive disorder, generalized anxiety disorder, hypothyroidism, OSA, cognitive impairment who presented to [facility] ED on 07/26/2024 with generalized pain, nausea/vomiting, lethargy and near syncope.  In the ED, temperature 101.5 F rectally, HR 103, RR 22, BP 91/61, SpO2 94% on room air.  WBC 19.3, hemoglobin 10.8, platelet count 177.  Sodium 134, potassium 3.4, chloride 97, CO2 22, glucose 153, BUN 24, creat 1.36.  AST 28, ALT 16, total bilirubin 1.1.  High-sensitivity troponin less than 15.  Lactic acid 1.5.  COVID/influenza/RSV PCR negative.  Urinalysis with moderate leukocytes, positive nitrite, many bacteria, greater than 50 WBCs.  Current plan for skilled nursing facility.   Assessment & Plan:   Acute metabolic encephalopathy, POA: Improving Patient presenting with confusion, lethargy in the setting of fever, elevated WBC count, urinalysis consistent with urinary tract infection and abdominal imaging consistent with right-sided pyelonephritis.  CT head without contrast with no acute intracranial abnormality, noted chronic cerebral white matter disease stable from MRI prior year, extensive chronic suboccipital cervical spine surgery that is grossly stable since 2007. -- Supportive care, treatment as below -Delirium precautions  Severe sepsis, POA--E. coli bacteremia Urinary tract infection Right pyelonephritis Patient presenting to ED with confusion, generalized pain associate with nausea and vomiting.  Found to be febrile with temperature 101.5 F rectally, tachycardic, tachypneic with elevated WBC count of 19.3 and urinalysis consistent with urinary tract infection.  CT abdomen/pelvis with contrast with  abnormal right kidney and ureter consistent with a standing urinary tract infection/acute pyelonephritis, no other acute or inflammatory process within the abdomen/pelvis. -- WBCs trending down -- Blood culture: E. coli -- Urine culture: E. coli -- Ceftriaxone 2 g IV every 24 hours changed to oral antibiotics as white blood cell count has normalized  Hypokalemia -Replete  Chronic diastolic congestive heart failure, compensated HTN -Resume home meds as able  HLD -- Pravastatin  40 mg p.o. daily  DM2 Currently diet controlled at baseline.  6.5 on 06/22/2024, well-controlled. -- SSI for coverage -- CBGs qAC/HS  Hypothyroidism -- Levothyroxine  50 mcg p.o. daily  Generalized anxiety disorder Major depressive disorder -- Paxil  40 mg p.o. daily  Cognitive impairment Follows with neurology outpatient.  On donepezil . --Continue donepezil  5 mg p.o. daily --Delirium precautions  OSA -- Nocturnal CPAP  Weakness/debility/deconditioning: -- PT/OT evaluation-SNF placement  Hypokalemia - Replete  Of note, patient does not take Neurontin  TID at home-- takes once or twice daily per patient   DVT prophylaxis: heparin injection 5,000 Units Start: 07/26/24 1700    Code Status: Full Code Family Communication: No family present at bedside this morning- called neice  Disposition Plan:  Level of care: Med-Surg Status is: Inpatient Remains inpatient appropriate because: Await SNF placement    Consultants:  None   Subjective: Sleepy this a.m., but will wake up and answer questions  Objective: Vitals:   07/29/24 0412 07/29/24 1403 07/29/24 2048 07/30/24 0403  BP: 119/68 118/64 127/72 108/72  Pulse: 70 86 80 86  Resp: 16 17 18 18   Temp: 98.5 F (36.9 C) 99.7 F (37.6 C) 98.6 F (37 C)   TempSrc: Oral Oral Oral   SpO2: 93% 98% 92% 98%  Weight:      Height:  No intake or output data in the 24 hours ending 07/30/24 1026  Filed Weights   07/26/24 1846  Weight: 70.1  kg    Examination:  Physical Exam:  General: Appearance:     Overweight female in no acute distress     Lungs:     respirations unlabored  Heart:    Normal heart rate. .   MS:   All extremities are intact.   Neurologic:   Awake, alert       Data Reviewed: I have personally reviewed following labs and imaging studies  CBC: Recent Labs  Lab 07/26/24 0729 07/27/24 0440 07/28/24 0416 07/29/24 0356  WBC 19.3* 19.1* 19.1* 10.5  NEUTROABS 16.5*  --   --   --   HGB 10.8* 8.5* 9.2* 8.2*  HCT 31.4* 25.8* 29.4* 25.8*  MCV 83.7 86.3 87.5 86.3  PLT 177 148* 158 156   Basic Metabolic Panel: Recent Labs  Lab 07/26/24 0729 07/27/24 0440 07/28/24 0416 07/29/24 0356 07/30/24 0354  NA 134* 138 137 137 140  K 3.4* 3.0* 4.0 3.6 3.3*  CL 97* 105 106 106 108  CO2 22 21* 21* 22 22  GLUCOSE 153* 120* 103* 121* 112*  BUN 24* 24* 21 16 12   CREATININE 1.36* 1.03* 0.92 0.79 0.71  CALCIUM  9.5 8.6* 8.7* 8.4* 8.5*  MG  --  1.4* 2.3  --   --   PHOS  --   --  1.4*  --   --    GFR: Estimated Creatinine Clearance: 60.9 mL/min (by C-G formula based on SCr of 0.71 mg/dL). Liver Function Tests: Recent Labs  Lab 07/26/24 0729  AST 28  ALT 16  ALKPHOS 100  BILITOT 1.1  PROT 7.7  ALBUMIN 3.8   No results for input(s): LIPASE, AMYLASE in the last 168 hours. No results for input(s): AMMONIA in the last 168 hours. Coagulation Profile: No results for input(s): INR, PROTIME in the last 168 hours. Cardiac Enzymes: No results for input(s): CKTOTAL, CKMB, CKMBINDEX, TROPONINI in the last 168 hours. BNP (last 3 results) No results for input(s): PROBNP in the last 8760 hours. HbA1C: No results for input(s): HGBA1C in the last 72 hours. CBG: Recent Labs  Lab 07/29/24 0738 07/29/24 1125 07/29/24 1705 07/29/24 2048 07/30/24 0722  GLUCAP 93 159* 101* 148* 104*   Lipid Profile: No results for input(s): CHOL, HDL, LDLCALC, TRIG, CHOLHDL, LDLDIRECT in the  last 72 hours. Thyroid  Function Tests: No results for input(s): TSH, T4TOTAL, FREET4, T3FREE, THYROIDAB in the last 72 hours. Anemia Panel: No results for input(s): VITAMINB12, FOLATE, FERRITIN, TIBC, IRON, RETICCTPCT in the last 72 hours. Sepsis Labs: Recent Labs  Lab 07/26/24 0729 07/26/24 1211 07/26/24 1655  LATICACIDVEN 1.5 1.7 1.8    Recent Results (from the past 240 hours)  Resp panel by RT-PCR (RSV, Flu A&B, Covid) Anterior Nasal Swab     Status: None   Collection Time: 07/26/24  7:51 AM   Specimen: Anterior Nasal Swab  Result Value Ref Range Status   SARS Coronavirus 2 by RT PCR NEGATIVE NEGATIVE Final    Comment: (NOTE) SARS-CoV-2 target nucleic acids are NOT DETECTED.  The SARS-CoV-2 RNA is generally detectable in upper respiratory specimens during the acute phase of infection. The lowest concentration of SARS-CoV-2 viral copies this assay can detect is 138 copies/mL. A negative result does not preclude SARS-Cov-2 infection and should not be used as the sole basis for treatment or other patient management decisions. A negative result may occur  with  improper specimen collection/handling, submission of specimen other than nasopharyngeal swab, presence of viral mutation(s) within the areas targeted by this assay, and inadequate number of viral copies(<138 copies/mL). A negative result must be combined with clinical observations, patient history, and epidemiological information. The expected result is Negative.  Fact Sheet for Patients:  BloggerCourse.com  Fact Sheet for Healthcare Providers:  SeriousBroker.it  This test is no t yet approved or cleared by the [place]  FDA and  has been authorized for detection and/or diagnosis of SARS-CoV-2 by FDA under an Emergency Use Authorization (EUA). This EUA will remain  in effect (meaning this test can be used) for the duration of the COVID-19  declaration under Section 564(b)(1) of the Act, 21 U.S.C.section 360bbb-3(b)(1), unless the authorization is terminated  or revoked sooner.       Influenza A by PCR NEGATIVE NEGATIVE Final   Influenza B by PCR NEGATIVE NEGATIVE Final    Comment: (NOTE) The Xpert Xpress SARS-CoV-2/FLU/RSV plus assay is intended as an aid in the diagnosis of influenza from Nasopharyngeal swab specimens and should not be used as a sole basis for treatment. Nasal washings and aspirates are unacceptable for Xpert Xpress SARS-CoV-2/FLU/RSV testing.  Fact Sheet for Patients: BloggerCourse.com  Fact Sheet for Healthcare Providers: SeriousBroker.it  This test is not yet approved or cleared by the [place]  FDA and has been authorized for detection and/or diagnosis of SARS-CoV-2 by FDA under an Emergency Use Authorization (EUA). This EUA will remain in effect (meaning this test can be used) for the duration of the COVID-19 declaration under Section 564(b)(1) of the Act, 21 U.S.C. section 360bbb-3(b)(1), unless the authorization is terminated or revoked.     Resp Syncytial Virus by PCR NEGATIVE NEGATIVE Final    Comment: (NOTE) Fact Sheet for Patients: BloggerCourse.com  Fact Sheet for Healthcare Providers: SeriousBroker.it  This test is not yet approved or cleared by the [place]  FDA and has been authorized for detection and/or diagnosis of SARS-CoV-2 by FDA under an Emergency Use Authorization (EUA). This EUA will remain in effect (meaning this test can be used) for the duration of the COVID-19 declaration under Section 564(b)(1) of the Act, 21 U.S.C. section 360bbb-3(b)(1), unless the authorization is terminated or revoked.  Performed at [facility], [address] [place], [place] [address]   Culture, blood (single)     Status: Abnormal   Collection Time: 07/26/24  9:39 AM    Specimen: BLOOD RIGHT FOREARM  Result Value Ref Range Status   Specimen Description BLOOD RIGHT FOREARM  Final   Special Requests   Final    BOTTLES DRAWN AEROBIC AND ANAEROBIC Blood Culture adequate volume   Culture  Setup Time   Final    GRAM NEGATIVE RODS IN BOTH AEROBIC AND ANAEROBIC BOTTLES CRITICAL RESULT CALLED TO, READ BACK BY AND VERIFIED WITH: PHARMD M. BELL [id number] AT 0027, ADC Performed at [facility], [address], [place], [place] [address]    Culture ESCHERICHIA COLI (A)  Final   Report Status 07/28/2024 FINAL  Final   Organism ID, Bacteria ESCHERICHIA COLI  Final      Susceptibility   Escherichia coli - MIC*    AMPICILLIN <=2 SENSITIVE Sensitive     CEFAZOLIN (NON-URINE) <=1 SENSITIVE Sensitive     CEFEPIME <=0.12 SENSITIVE Sensitive     ERTAPENEM <=0.12 SENSITIVE Sensitive     CEFTRIAXONE <=0.25 SENSITIVE Sensitive     CIPROFLOXACIN  <=0.06 SENSITIVE Sensitive  GENTAMICIN <=1 SENSITIVE Sensitive     MEROPENEM <=0.25 SENSITIVE Sensitive     TRIMETH /SULFA  <=20 SENSITIVE Sensitive     AMPICILLIN/SULBACTAM <=2 SENSITIVE Sensitive     PIP/TAZO Value in next row Sensitive      <=4 SENSITIVEThis is a modified FDA-approved test that has been validated and its performance characteristics determined by the reporting laboratory.  This laboratory is certified under the Clinical Laboratory Improvement Amendments CLIA as qualified to perform high complexity clinical laboratory testing.    * ESCHERICHIA COLI  Urine Culture     Status: Abnormal   Collection Time: 07/26/24  9:39 AM   Specimen: Urine, Random  Result Value Ref Range Status   Specimen Description   Final    URINE, RANDOM Performed at [facility], [name] [place], [place] [address]    Special Requests   Final    NONE Reflexed from [id number] Performed at [facility], [address] [place], [place] [address]    Culture (A)  Final    40,000 COLONIES/mL ESCHERICHIA  COLI Two isolates with different morphologies were identified as the same organism.The most resistant organism was reported. Performed at [facility], [address], [place], [place] [address]    Report Status 07/28/2024 FINAL  Final   Organism ID, Bacteria ESCHERICHIA COLI (A)  Final      Susceptibility   Escherichia coli - MIC*    AMPICILLIN RESISTANT Resistant     CEFAZOLIN (URINE) Value in next row Resistant      >=32 RESISTANTThis is a modified FDA-approved test that has been validated and its performance characteristics determined by the reporting laboratory.  This laboratory is certified under the Clinical Laboratory Improvement Amendments CLIA as qualified to perform high complexity clinical laboratory testing.    CEFEPIME Value in next row Sensitive      >=32 RESISTANTThis is a modified FDA-approved test that has been validated and its performance characteristics determined by the reporting laboratory.  This laboratory is certified under the Clinical Laboratory Improvement Amendments CLIA as qualified to perform high complexity clinical laboratory testing.    ERTAPENEM Value in next row Sensitive      >=32 RESISTANTThis is a modified FDA-approved test that has been validated and its performance characteristics determined by the reporting laboratory.  This laboratory is certified under the Clinical Laboratory Improvement Amendments CLIA as qualified to perform high complexity clinical laboratory testing.    CEFTRIAXONE Value in next row Sensitive      >=32 RESISTANTThis is a modified FDA-approved test that has been validated and its performance characteristics determined by the reporting laboratory.  This laboratory is certified under the Clinical Laboratory Improvement Amendments CLIA as qualified to perform high complexity clinical laboratory testing.    CIPROFLOXACIN  Value in next row Sensitive      >=32 RESISTANTThis is a modified FDA-approved test that has been validated and  its performance characteristics determined by the reporting laboratory.  This laboratory is certified under the Clinical Laboratory Improvement Amendments CLIA as qualified to perform high complexity clinical laboratory testing.    GENTAMICIN Value in next row Sensitive      >=32 RESISTANTThis is a modified FDA-approved test that has been validated and its performance characteristics determined by the reporting laboratory.  This laboratory is certified under the Clinical Laboratory Improvement Amendments CLIA as qualified to perform high complexity clinical laboratory testing.    NITROFURANTOIN Value in next row Sensitive      >=  32 RESISTANTThis is a modified FDA-approved test that has been validated and its performance characteristics determined by the reporting laboratory.  This laboratory is certified under the Clinical Laboratory Improvement Amendments CLIA as qualified to perform high complexity clinical laboratory testing.    TRIMETH /SULFA  Value in next row Sensitive      >=32 RESISTANTThis is a modified FDA-approved test that has been validated and its performance characteristics determined by the reporting laboratory.  This laboratory is certified under the Clinical Laboratory Improvement Amendments CLIA as qualified to perform high complexity clinical laboratory testing.    AMPICILLIN/SULBACTAM Value in next row Sensitive      >=32 RESISTANTThis is a modified FDA-approved test that has been validated and its performance characteristics determined by the reporting laboratory.  This laboratory is certified under the Clinical Laboratory Improvement Amendments CLIA as qualified to perform high complexity clinical laboratory testing.    PIP/TAZO Value in next row Sensitive      <=4 SENSITIVEThis is a modified FDA-approved test that has been validated and its performance characteristics determined by the reporting laboratory.  This laboratory is certified under the Clinical Laboratory Improvement  Amendments CLIA as qualified to perform high complexity clinical laboratory testing.    MEROPENEM Value in next row Sensitive      <=4 SENSITIVEThis is a modified FDA-approved test that has been validated and its performance characteristics determined by the reporting laboratory.  This laboratory is certified under the Clinical Laboratory Improvement Amendments CLIA as qualified to perform high complexity clinical laboratory testing.    * 40,000 COLONIES/mL ESCHERICHIA COLI  Blood Culture ID Panel (Reflexed)     Status: Abnormal   Collection Time: 07/26/24  9:39 AM  Result Value Ref Range Status   Enterococcus faecalis NOT DETECTED NOT DETECTED Final   Enterococcus Faecium NOT DETECTED NOT DETECTED Final   Listeria monocytogenes NOT DETECTED NOT DETECTED Final   Staphylococcus species NOT DETECTED NOT DETECTED Final   Staphylococcus aureus (BCID) NOT DETECTED NOT DETECTED Final   Staphylococcus epidermidis NOT DETECTED NOT DETECTED Final   Staphylococcus lugdunensis NOT DETECTED NOT DETECTED Final   Streptococcus species NOT DETECTED NOT DETECTED Final   Streptococcus agalactiae NOT DETECTED NOT DETECTED Final   Streptococcus pneumoniae NOT DETECTED NOT DETECTED Final   Streptococcus pyogenes NOT DETECTED NOT DETECTED Final   A.calcoaceticus-baumannii NOT DETECTED NOT DETECTED Final   Bacteroides fragilis NOT DETECTED NOT DETECTED Final   Enterobacterales DETECTED (A) NOT DETECTED Final    Comment: Enterobacterales represent a large order of gram negative bacteria, not a single organism. CRITICAL RESULT CALLED TO, READ BACK BY AND VERIFIED WITH: PHARMD M. BELL [id number] AT 0027, ADC    Enterobacter cloacae complex NOT DETECTED NOT DETECTED Final   Escherichia coli DETECTED (A) NOT DETECTED Final    Comment: CRITICAL RESULT CALLED TO, READ BACK BY AND VERIFIED WITH: PHARMD M. BELL [id number] AT 0027, ADC    Klebsiella aerogenes NOT DETECTED NOT DETECTED Final   Klebsiella oxytoca NOT  DETECTED NOT DETECTED Final   Klebsiella pneumoniae NOT DETECTED NOT DETECTED Final   Proteus species NOT DETECTED NOT DETECTED Final   Salmonella species NOT DETECTED NOT DETECTED Final   Serratia marcescens NOT DETECTED NOT DETECTED Final   Haemophilus influenzae NOT DETECTED NOT DETECTED Final   Neisseria meningitidis NOT DETECTED NOT DETECTED Final   Pseudomonas aeruginosa NOT DETECTED NOT DETECTED Final   Stenotrophomonas maltophilia NOT DETECTED NOT DETECTED Final   Candida albicans NOT DETECTED NOT DETECTED Final   Candida  auris NOT DETECTED NOT DETECTED Final   Candida glabrata NOT DETECTED NOT DETECTED Final   Candida krusei NOT DETECTED NOT DETECTED Final   Candida parapsilosis NOT DETECTED NOT DETECTED Final   Candida tropicalis NOT DETECTED NOT DETECTED Final   Cryptococcus neoformans/gattii NOT DETECTED NOT DETECTED Final   CTX-M ESBL NOT DETECTED NOT DETECTED Final   Carbapenem resistance IMP NOT DETECTED NOT DETECTED Final   Carbapenem resistance KPC NOT DETECTED NOT DETECTED Final   Carbapenem resistance NDM NOT DETECTED NOT DETECTED Final   Carbapenem resist OXA 48 LIKE NOT DETECTED NOT DETECTED Final   Carbapenem resistance VIM NOT DETECTED NOT DETECTED Final    Comment: Performed at [facility], [address], [place], [place] [address]         Radiology Studies: No results found.       Scheduled Meds:  cefadroxil  1,000 mg Oral BID   diclofenac Sodium  2 g Topical QID   donepezil   5 mg Oral Daily   gabapentin   200 mg Oral TID   heparin  5,000 Units Subcutaneous Q8H   insulin  aspart  0-5 Units Subcutaneous QHS   insulin  aspart  0-6 Units Subcutaneous TID WC   levothyroxine   50 mcg Oral Q0600   metoprolol  succinate  100 mg Oral Daily   PARoxetine   40 mg Oral q morning   pravastatin   40 mg Oral Daily   saccharomyces boulardii  250 mg Oral BID   Continuous Infusions:     LOS: 4 days    Time spent: 52 minutes spent on 07/30/2024 caring  for this patient face-to-face including chart review, ordering labs/tests, documenting, discussion with nursing staff, consultants, updating family and interview/physical exam    Harlene RAYMOND Bowl, DO [facility] Available via Epic secure chat 7am-7pm After these hours, please refer to coverage provider listed on [email or web address] 07/30/2024, 10:26 AM

## 2024-07-31 DIAGNOSIS — N12 Tubulo-interstitial nephritis, not specified as acute or chronic: Secondary | ICD-10-CM | POA: Diagnosis not present

## 2024-07-31 LAB — GLUCOSE, CAPILLARY
Glucose-Capillary: 93 mg/dL (ref 70–99)
Glucose-Capillary: 151 mg/dL — ABNORMAL HIGH (ref 70–99)
Glucose-Capillary: 102 mg/dL — ABNORMAL HIGH (ref 70–99)
Glucose-Capillary: 58 mg/dL — ABNORMAL LOW (ref 70–99)
Glucose-Capillary: 100 mg/dL — ABNORMAL HIGH (ref 70–99)

## 2024-07-31 MED ORDER — OXYBUTYNIN CHLORIDE 5 MG PO TABS
2.5000 mg | ORAL_TABLET | Freq: Two times a day (BID) | ORAL | Status: DC
  Administered 2024-07-31 – 2024-08-02 (×5): 2.5 mg via ORAL
  Filled 2024-07-31 (×5): qty 1

## 2024-07-31 NOTE — Progress Notes (Signed)
 PROGRESS NOTE    Debra [name]  FMW:[id number] DOB: 11/30/1955 DOA: 07/26/2024 PCP: Antonio Cyndee Jamee JONELLE, DO    Brief Narrative:   Debra [name] is a 68 y.o. female with past medical history significant for chronic diastolic congestive heart failure, HTN, DMT2, major depressive disorder, generalized anxiety disorder, hypothyroidism, OSA, cognitive impairment who presented to [facility] ED on 07/26/2024 with generalized pain, nausea/vomiting, lethargy and near syncope.  In the ED, temperature 101.5 F rectally, HR 103, RR 22, BP 91/61, SpO2 94% on room air.  WBC 19.3, hemoglobin 10.8, platelet count 177.  Sodium 134, potassium 3.4, chloride 97, CO2 22, glucose 153, BUN 24, creat 1.36.  AST 28, ALT 16, total bilirubin 1.1.  High-sensitivity troponin less than 15.  Lactic acid 1.5.  COVID/influenza/RSV PCR negative.  Urinalysis with moderate leukocytes, positive nitrite, many bacteria, greater than 50 WBCs.  Current plan for skilled nursing facility.   Assessment & Plan:   Acute metabolic encephalopathy, POA: Improving Patient presenting with confusion, lethargy in the setting of fever, elevated WBC count, urinalysis consistent with urinary tract infection and abdominal imaging consistent with right-sided pyelonephritis.  CT head without contrast with no acute intracranial abnormality, noted chronic cerebral white matter disease stable from MRI prior year, extensive chronic suboccipital cervical spine surgery that is grossly stable since 2007. -- Supportive care, treatment as below -Delirium precautions  Severe sepsis, POA--E. coli bacteremia Urinary tract infection Right pyelonephritis Patient presenting to ED with confusion, generalized pain associate with nausea and vomiting.  Found to be febrile with temperature 101.5 F rectally, tachycardic, tachypneic with elevated WBC count of 19.3 and urinalysis consistent with urinary tract infection.  CT abdomen/pelvis with contrast with  abnormal right kidney and ureter consistent with a standing urinary tract infection/acute pyelonephritis, no other acute or inflammatory process within the abdomen/pelvis. -- WBCs trending down -- Blood culture: E. coli -- Urine culture: E. coli -- Ceftriaxone 2 g IV every 24 hours changed to oral antibiotics as white blood cell count has normalized  Hypokalemia -Replete  Chronic diastolic congestive heart failure, compensated HTN -Resume home meds as able  HLD -- Pravastatin  40 mg p.o. daily  DM2 Currently diet controlled at baseline.  6.5 on 06/22/2024, well-controlled. -- SSI for coverage -- CBGs qAC/HS  Hypothyroidism -- Levothyroxine  50 mcg p.o. daily  Generalized anxiety disorder Major depressive disorder -- Paxil  40 mg p.o. daily  Cognitive impairment Follows with neurology outpatient.  On donepezil . --Continue donepezil  5 mg p.o. daily --Delirium precautions  OSA -- Nocturnal CPAP  Weakness/debility/deconditioning: -- PT/OT evaluation-SNF placement  Hypokalemia - Replete  Of note, patient does not take Neurontin  TID at home-- takes once or twice daily per patient will adjust so she is not so sleepy   DVT prophylaxis: heparin injection 5,000 Units Start: 07/26/24 1700    Code Status: Full Code Family Communication: No family present at bedside this morning- called neice  Disposition Plan:  Level of care: Med-Surg Status is: Inpatient Remains inpatient appropriate because: Await SNF placement    Consultants:  None   Subjective: Sleepy this a.m., but will wake up and answer questions  Objective: Vitals:   07/30/24 0403 07/30/24 2010 07/31/24 0651 07/31/24 1049  BP: 108/72 (!) 157/84 (!) 154/81 134/75  Pulse: 86 (!) 106 67 81  Resp: 18 17 16 16   Temp:  98.4 F (36.9 C) 97.7 F (36.5 C) 98.1 F (36.7 C)  TempSrc:  Oral  Oral  SpO2: 98% 100% 94% 97%  Weight:  Height:        Intake/Output Summary (Last 24 hours) at 07/31/2024  1309 Last data filed at 07/31/2024 1156 Gross per 24 hour  Intake 540 ml  Output --  Net 540 ml    Filed Weights   07/26/24 1846  Weight: 70.1 kg    Examination:  Physical Exam:  General: Appearance:     Overweight female in no acute distress     Lungs:     respirations unlabored  Heart:    Normal heart rate. .   MS:   All extremities are intact.   Neurologic:   Awake, alert       Data Reviewed: I have personally reviewed following labs and imaging studies  CBC: Recent Labs  Lab 07/26/24 0729 07/27/24 0440 07/28/24 0416 07/29/24 0356  WBC 19.3* 19.1* 19.1* 10.5  NEUTROABS 16.5*  --   --   --   HGB 10.8* 8.5* 9.2* 8.2*  HCT 31.4* 25.8* 29.4* 25.8*  MCV 83.7 86.3 87.5 86.3  PLT 177 148* 158 156   Basic Metabolic Panel: Recent Labs  Lab 07/26/24 0729 07/27/24 0440 07/28/24 0416 07/29/24 0356 07/30/24 0354  NA 134* 138 137 137 140  K 3.4* 3.0* 4.0 3.6 3.3*  CL 97* 105 106 106 108  CO2 22 21* 21* 22 22  GLUCOSE 153* 120* 103* 121* 112*  BUN 24* 24* 21 16 12   CREATININE 1.36* 1.03* 0.92 0.79 0.71  CALCIUM  9.5 8.6* 8.7* 8.4* 8.5*  MG  --  1.4* 2.3  --   --   PHOS  --   --  1.4*  --   --    GFR: Estimated Creatinine Clearance: 60.9 mL/min (by C-G formula based on SCr of 0.71 mg/dL). Liver Function Tests: Recent Labs  Lab 07/26/24 0729  AST 28  ALT 16  ALKPHOS 100  BILITOT 1.1  PROT 7.7  ALBUMIN 3.8   No results for input(s): LIPASE, AMYLASE in the last 168 hours. No results for input(s): AMMONIA in the last 168 hours. Coagulation Profile: No results for input(s): INR, PROTIME in the last 168 hours. Cardiac Enzymes: No results for input(s): CKTOTAL, CKMB, CKMBINDEX, TROPONINI in the last 168 hours. BNP (last 3 results) No results for input(s): PROBNP in the last 8760 hours. HbA1C: No results for input(s): HGBA1C in the last 72 hours. CBG: Recent Labs  Lab 07/30/24 1153 07/30/24 1536 07/30/24 2024 07/31/24 0804  07/31/24 1134  GLUCAP 130* 145* 154* 93 151*   Lipid Profile: No results for input(s): CHOL, HDL, LDLCALC, TRIG, CHOLHDL, LDLDIRECT in the last 72 hours. Thyroid  Function Tests: No results for input(s): TSH, T4TOTAL, FREET4, T3FREE, THYROIDAB in the last 72 hours. Anemia Panel: No results for input(s): VITAMINB12, FOLATE, FERRITIN, TIBC, IRON, RETICCTPCT in the last 72 hours. Sepsis Labs: Recent Labs  Lab 07/26/24 0729 07/26/24 1211 07/26/24 1655  LATICACIDVEN 1.5 1.7 1.8    Recent Results (from the past 240 hours)  Resp panel by RT-PCR (RSV, Flu A&B, Covid) Anterior Nasal Swab     Status: None   Collection Time: 07/26/24  7:51 AM   Specimen: Anterior Nasal Swab  Result Value Ref Range Status   SARS Coronavirus 2 by RT PCR NEGATIVE NEGATIVE Final    Comment: (NOTE) SARS-CoV-2 target nucleic acids are NOT DETECTED.  The SARS-CoV-2 RNA is generally detectable in upper respiratory specimens during the acute phase of infection. The lowest concentration of SARS-CoV-2 viral copies this assay can detect is 138 copies/mL. A negative  result does not preclude SARS-Cov-2 infection and should not be used as the sole basis for treatment or other patient management decisions. A negative result may occur with  improper specimen collection/handling, submission of specimen other than nasopharyngeal swab, presence of viral mutation(s) within the areas targeted by this assay, and inadequate number of viral copies(<138 copies/mL). A negative result must be combined with clinical observations, patient history, and epidemiological information. The expected result is Negative.  Fact Sheet for Patients:  BloggerCourse.com  Fact Sheet for Healthcare Providers:  SeriousBroker.it  This test is no t yet approved or cleared by the [place]  FDA and  has been authorized for detection and/or diagnosis of  SARS-CoV-2 by FDA under an Emergency Use Authorization (EUA). This EUA will remain  in effect (meaning this test can be used) for the duration of the COVID-19 declaration under Section 564(b)(1) of the Act, 21 U.S.C.section 360bbb-3(b)(1), unless the authorization is terminated  or revoked sooner.       Influenza A by PCR NEGATIVE NEGATIVE Final   Influenza B by PCR NEGATIVE NEGATIVE Final    Comment: (NOTE) The Xpert Xpress SARS-CoV-2/FLU/RSV plus assay is intended as an aid in the diagnosis of influenza from Nasopharyngeal swab specimens and should not be used as a sole basis for treatment. Nasal washings and aspirates are unacceptable for Xpert Xpress SARS-CoV-2/FLU/RSV testing.  Fact Sheet for Patients: BloggerCourse.com  Fact Sheet for Healthcare Providers: SeriousBroker.it  This test is not yet approved or cleared by the [place]  FDA and has been authorized for detection and/or diagnosis of SARS-CoV-2 by FDA under an Emergency Use Authorization (EUA). This EUA will remain in effect (meaning this test can be used) for the duration of the COVID-19 declaration under Section 564(b)(1) of the Act, 21 U.S.C. section 360bbb-3(b)(1), unless the authorization is terminated or revoked.     Resp Syncytial Virus by PCR NEGATIVE NEGATIVE Final    Comment: (NOTE) Fact Sheet for Patients: BloggerCourse.com  Fact Sheet for Healthcare Providers: SeriousBroker.it  This test is not yet approved or cleared by the [place]  FDA and has been authorized for detection and/or diagnosis of SARS-CoV-2 by FDA under an Emergency Use Authorization (EUA). This EUA will remain in effect (meaning this test can be used) for the duration of the COVID-19 declaration under Section 564(b)(1) of the Act, 21 U.S.C. section 360bbb-3(b)(1), unless the authorization is terminated  or revoked.  Performed at [facility], [address] [place], [place] [address]   Culture, blood (single)     Status: Abnormal   Collection Time: 07/26/24  9:39 AM   Specimen: BLOOD RIGHT FOREARM  Result Value Ref Range Status   Specimen Description BLOOD RIGHT FOREARM  Final   Special Requests   Final    BOTTLES DRAWN AEROBIC AND ANAEROBIC Blood Culture adequate volume   Culture  Setup Time   Final    GRAM NEGATIVE RODS IN BOTH AEROBIC AND ANAEROBIC BOTTLES CRITICAL RESULT CALLED TO, READ BACK BY AND VERIFIED WITH: PHARMD M. BELL [id number] AT 0027, ADC Performed at [facility], [address], [place], [place] [address]    Culture ESCHERICHIA COLI (A)  Final   Report Status 07/28/2024 FINAL  Final   Organism ID, Bacteria ESCHERICHIA COLI  Final      Susceptibility   Escherichia coli - MIC*    AMPICILLIN <=2 SENSITIVE Sensitive     CEFAZOLIN (NON-URINE) <=1 SENSITIVE Sensitive     CEFEPIME <=0.12 SENSITIVE Sensitive  ERTAPENEM <=0.12 SENSITIVE Sensitive     CEFTRIAXONE <=0.25 SENSITIVE Sensitive     CIPROFLOXACIN  <=0.06 SENSITIVE Sensitive     GENTAMICIN <=1 SENSITIVE Sensitive     MEROPENEM <=0.25 SENSITIVE Sensitive     TRIMETH /SULFA  <=20 SENSITIVE Sensitive     AMPICILLIN/SULBACTAM <=2 SENSITIVE Sensitive     PIP/TAZO Value in next row Sensitive      <=4 SENSITIVEThis is a modified FDA-approved test that has been validated and its performance characteristics determined by the reporting laboratory.  This laboratory is certified under the Clinical Laboratory Improvement Amendments CLIA as qualified to perform high complexity clinical laboratory testing.    * ESCHERICHIA COLI  Urine Culture     Status: Abnormal   Collection Time: 07/26/24  9:39 AM   Specimen: Urine, Random  Result Value Ref Range Status   Specimen Description   Final    URINE, RANDOM Performed at [facility], [address] [place], [place] [address]    Special Requests    Final    NONE Reflexed from [id number] Performed at [facility], [address] [place], [place] [address]    Culture (A)  Final    40,000 COLONIES/mL ESCHERICHIA COLI Two isolates with different morphologies were identified as the same organism.The most resistant organism was reported. Performed at [facility], [address], [place], [place] [address]    Report Status 07/28/2024 FINAL  Final   Organism ID, Bacteria ESCHERICHIA COLI (A)  Final      Susceptibility   Escherichia coli - MIC*    AMPICILLIN RESISTANT Resistant     CEFAZOLIN (URINE) Value in next row Resistant      >=32 RESISTANTThis is a modified FDA-approved test that has been validated and its performance characteristics determined by the reporting laboratory.  This laboratory is certified under the Clinical Laboratory Improvement Amendments CLIA as qualified to perform high complexity clinical laboratory testing.    CEFEPIME Value in next row Sensitive      >=32 RESISTANTThis is a modified FDA-approved test that has been validated and its performance characteristics determined by the reporting laboratory.  This laboratory is certified under the Clinical Laboratory Improvement Amendments CLIA as qualified to perform high complexity clinical laboratory testing.    ERTAPENEM Value in next row Sensitive      >=32 RESISTANTThis is a modified FDA-approved test that has been validated and its performance characteristics determined by the reporting laboratory.  This laboratory is certified under the Clinical Laboratory Improvement Amendments CLIA as qualified to perform high complexity clinical laboratory testing.    CEFTRIAXONE Value in next row Sensitive      >=32 RESISTANTThis is a modified FDA-approved test that has been validated and its performance characteristics determined by the reporting laboratory.  This laboratory is certified under the Clinical Laboratory Improvement Amendments CLIA as qualified to perform  high complexity clinical laboratory testing.    CIPROFLOXACIN  Value in next row Sensitive      >=32 RESISTANTThis is a modified FDA-approved test that has been validated and its performance characteristics determined by the reporting laboratory.  This laboratory is certified under the Clinical Laboratory Improvement Amendments CLIA as qualified to perform high complexity clinical laboratory testing.    GENTAMICIN Value in next row Sensitive      >=32 RESISTANTThis is a modified FDA-approved test that has been validated and its performance characteristics determined by the reporting laboratory.  This laboratory is certified under the Clinical Laboratory Improvement  Amendments CLIA as qualified to perform high complexity clinical laboratory testing.    NITROFURANTOIN Value in next row Sensitive      >=32 RESISTANTThis is a modified FDA-approved test that has been validated and its performance characteristics determined by the reporting laboratory.  This laboratory is certified under the Clinical Laboratory Improvement Amendments CLIA as qualified to perform high complexity clinical laboratory testing.    TRIMETH /SULFA  Value in next row Sensitive      >=32 RESISTANTThis is a modified FDA-approved test that has been validated and its performance characteristics determined by the reporting laboratory.  This laboratory is certified under the Clinical Laboratory Improvement Amendments CLIA as qualified to perform high complexity clinical laboratory testing.    AMPICILLIN/SULBACTAM Value in next row Sensitive      >=32 RESISTANTThis is a modified FDA-approved test that has been validated and its performance characteristics determined by the reporting laboratory.  This laboratory is certified under the Clinical Laboratory Improvement Amendments CLIA as qualified to perform high complexity clinical laboratory testing.    PIP/TAZO Value in next row Sensitive      <=4 SENSITIVEThis is a modified FDA-approved test  that has been validated and its performance characteristics determined by the reporting laboratory.  This laboratory is certified under the Clinical Laboratory Improvement Amendments CLIA as qualified to perform high complexity clinical laboratory testing.    MEROPENEM Value in next row Sensitive      <=4 SENSITIVEThis is a modified FDA-approved test that has been validated and its performance characteristics determined by the reporting laboratory.  This laboratory is certified under the Clinical Laboratory Improvement Amendments CLIA as qualified to perform high complexity clinical laboratory testing.    * 40,000 COLONIES/mL ESCHERICHIA COLI  Blood Culture ID Panel (Reflexed)     Status: Abnormal   Collection Time: 07/26/24  9:39 AM  Result Value Ref Range Status   Enterococcus faecalis NOT DETECTED NOT DETECTED Final   Enterococcus Faecium NOT DETECTED NOT DETECTED Final   Listeria monocytogenes NOT DETECTED NOT DETECTED Final   Staphylococcus species NOT DETECTED NOT DETECTED Final   Staphylococcus aureus (BCID) NOT DETECTED NOT DETECTED Final   Staphylococcus epidermidis NOT DETECTED NOT DETECTED Final   Staphylococcus lugdunensis NOT DETECTED NOT DETECTED Final   Streptococcus species NOT DETECTED NOT DETECTED Final   Streptococcus agalactiae NOT DETECTED NOT DETECTED Final   Streptococcus pneumoniae NOT DETECTED NOT DETECTED Final   Streptococcus pyogenes NOT DETECTED NOT DETECTED Final   A.calcoaceticus-baumannii NOT DETECTED NOT DETECTED Final   Bacteroides fragilis NOT DETECTED NOT DETECTED Final   Enterobacterales DETECTED (A) NOT DETECTED Final    Comment: Enterobacterales represent a large order of gram negative bacteria, not a single organism. CRITICAL RESULT CALLED TO, READ BACK BY AND VERIFIED WITH: PHARMD M. BELL [id number] AT 0027, ADC    Enterobacter cloacae complex NOT DETECTED NOT DETECTED Final   Escherichia coli DETECTED (A) NOT DETECTED Final    Comment: CRITICAL RESULT  CALLED TO, READ BACK BY AND VERIFIED WITH: PHARMD M. BELL [id number] AT 0027, ADC    Klebsiella aerogenes NOT DETECTED NOT DETECTED Final   Klebsiella oxytoca NOT DETECTED NOT DETECTED Final   Klebsiella pneumoniae NOT DETECTED NOT DETECTED Final   Proteus species NOT DETECTED NOT DETECTED Final   Salmonella species NOT DETECTED NOT DETECTED Final   Serratia marcescens NOT DETECTED NOT DETECTED Final   Haemophilus influenzae NOT DETECTED NOT DETECTED Final   Neisseria meningitidis NOT DETECTED NOT DETECTED Final   Pseudomonas aeruginosa NOT  DETECTED NOT DETECTED Final   Stenotrophomonas maltophilia NOT DETECTED NOT DETECTED Final   Candida albicans NOT DETECTED NOT DETECTED Final   Candida auris NOT DETECTED NOT DETECTED Final   Candida glabrata NOT DETECTED NOT DETECTED Final   Candida krusei NOT DETECTED NOT DETECTED Final   Candida parapsilosis NOT DETECTED NOT DETECTED Final   Candida tropicalis NOT DETECTED NOT DETECTED Final   Cryptococcus neoformans/gattii NOT DETECTED NOT DETECTED Final   CTX-M ESBL NOT DETECTED NOT DETECTED Final   Carbapenem resistance IMP NOT DETECTED NOT DETECTED Final   Carbapenem resistance KPC NOT DETECTED NOT DETECTED Final   Carbapenem resistance NDM NOT DETECTED NOT DETECTED Final   Carbapenem resist OXA 48 LIKE NOT DETECTED NOT DETECTED Final   Carbapenem resistance VIM NOT DETECTED NOT DETECTED Final    Comment: Performed at [facility], [address], [place], [place] [address]         Radiology Studies: No results found.       Scheduled Meds:  cefadroxil  1,000 mg Oral BID   diclofenac Sodium  2 g Topical QID   donepezil   5 mg Oral Daily   gabapentin   400 mg Oral QHS   heparin  5,000 Units Subcutaneous Q8H   insulin  aspart  0-5 Units Subcutaneous QHS   insulin  aspart  0-6 Units Subcutaneous TID WC   levothyroxine   50 mcg Oral Q0600   metoprolol  succinate  100 mg Oral Daily   oxybutynin   2.5 mg Oral BID   PARoxetine   40 mg  Oral q morning   pravastatin   40 mg Oral Daily   saccharomyces boulardii  250 mg Oral BID   Continuous Infusions:     LOS: 5 days    Time spent: 35 minutes spent on 07/31/2024 caring for this patient face-to-face including chart review, ordering labs/tests, documenting, discussion with nursing staff, consultants, updating family and interview/physical exam    Harlene RAYMOND Bowl, DO [facility] Available via Epic secure chat 7am-7pm After these hours, please refer to coverage provider listed on [email or web address] 07/31/2024, 1:09 PM

## 2024-07-31 NOTE — Plan of Care (Signed)
  Problem: Education: Goal: Knowledge of General Education information will improve Description: Including pain rating scale, medication(s)/side effects and non-pharmacologic comfort measures Outcome: Progressing   Problem: Clinical Measurements: Goal: Ability to maintain clinical measurements within normal limits will improve Outcome: Progressing   Problem: Activity: Goal: Risk for activity intolerance will decrease Outcome: Progressing   Problem: Nutrition: Goal: Adequate nutrition will be maintained Outcome: Progressing   Problem: Coping: Goal: Level of anxiety will decrease Outcome: Progressing   Problem: Elimination: Goal: Will not experience complications related to bowel motility Outcome: Progressing   Problem: Pain Managment: Goal: General experience of comfort will improve and/or be controlled Outcome: Progressing   Problem: Safety: Goal: Ability to remain free from injury will improve Outcome: Progressing

## 2024-07-31 NOTE — Progress Notes (Signed)
 Hypoglycemic Event  CBG: 58  Treatment: Dinner tray delivered to room and encouraged Pt to eat.  Symptoms: None  Follow-up CBG: Time:17:27 CBG Result:102  Comments/MD notified: Per Dr. Vann, continue with [facility] CBG checks and discontinue sliding scale.    Debra [name] A Gomez

## 2024-07-31 NOTE — Plan of Care (Signed)
  Problem: Metabolic: Goal: Ability to maintain appropriate glucose levels will improve Outcome: Progressing   Problem: Pain Managment: Goal: General experience of comfort will improve and/or be controlled Outcome: Progressing   Problem: Elimination: Goal: Will not experience complications related to urinary retention Outcome: Progressing   Problem: Coping: Goal: Level of anxiety will decrease Outcome: Progressing   Problem: Activity: Goal: Risk for activity intolerance will decrease Outcome: Progressing

## 2024-08-01 DIAGNOSIS — A419 Sepsis, unspecified organism: Secondary | ICD-10-CM | POA: Diagnosis not present

## 2024-08-01 DIAGNOSIS — N12 Tubulo-interstitial nephritis, not specified as acute or chronic: Secondary | ICD-10-CM | POA: Diagnosis not present

## 2024-08-01 LAB — BASIC METABOLIC PANEL WITH GFR
Anion gap: 10 (ref 5–15)
BUN: 8 mg/dL (ref 8–23)
CO2: 22 mmol/L (ref 22–32)
Calcium: 8.5 mg/dL — ABNORMAL LOW (ref 8.9–10.3)
Chloride: 105 mmol/L (ref 98–111)
Creatinine, Ser: 0.75 mg/dL (ref 0.44–1.00)
GFR, Estimated: >60 mL/min (ref >60)
Glucose, Bld: 91 mg/dL (ref 70–99)
Potassium: 3.7 mmol/L (ref 3.5–5.1)
Sodium: 138 mmol/L (ref 135–145)

## 2024-08-01 LAB — GLUCOSE, CAPILLARY
Glucose-Capillary: 88 mg/dL (ref 70–99)
Glucose-Capillary: 132 mg/dL — ABNORMAL HIGH (ref 70–99)
Glucose-Capillary: 174 mg/dL — ABNORMAL HIGH (ref 70–99)
Glucose-Capillary: 112 mg/dL — ABNORMAL HIGH (ref 70–99)

## 2024-08-01 LAB — CBC
HCT: 24.7 % — ABNORMAL LOW (ref 36.0–46.0)
Hemoglobin: 8 g/dL — ABNORMAL LOW (ref 12.0–15.0)
MCH: 27.8 pg (ref 26.0–34.0)
MCHC: 32.4 g/dL (ref 30.0–36.0)
MCV: 85.8 fL (ref 80.0–100.0)
Platelets: 221 K/uL (ref 150–400)
RBC: 2.88 MIL/uL — ABNORMAL LOW (ref 3.87–5.11)
RDW: 14.1 % (ref 11.5–15.5)
WBC: 13.5 K/uL — ABNORMAL HIGH (ref 4.0–10.5)
nRBC: 0 % (ref 0.0–0.2)

## 2024-08-01 MED ORDER — TRAMADOL HCL 50 MG PO TABS
50.0000 mg | ORAL_TABLET | Freq: Once | ORAL | Status: AC
  Administered 2024-08-01: 50 mg via ORAL
  Filled 2024-08-01: qty 1

## 2024-08-01 MED ORDER — PROCHLORPERAZINE EDISYLATE 10 MG/2ML IJ SOLN
10.0000 mg | Freq: Once | INTRAMUSCULAR | Status: AC
  Administered 2024-08-01: 10 mg via INTRAVENOUS
  Filled 2024-08-01: qty 2

## 2024-08-01 NOTE — Plan of Care (Signed)
 Patient education related to her health condition is improving

## 2024-08-01 NOTE — Progress Notes (Signed)
 Around 2000 on 07/31/24, patient began complaining of abdominal pain and nausea.  RN passed zofran , but patient did not receive any relief from Zofran .  Patient complains of bilateral upper abdominal pain.  NP notified and order was received for prochlorperazine and tramadol .  Patient did find some relief to sleep overnight, and states it was effective at 0400 rounds.

## 2024-08-01 NOTE — Plan of Care (Signed)
  Problem: Clinical Measurements: Goal: Will remain free from infection Outcome: Progressing   Problem: Coping: Goal: Level of anxiety will decrease Outcome: Progressing   Problem: Elimination: Goal: Will not experience complications related to urinary retention Outcome: Progressing   Problem: Pain Managment: Goal: General experience of comfort will improve and/or be controlled Outcome: Progressing   Problem: Safety: Goal: Ability to remain free from injury will improve Outcome: Progressing   Problem: Skin Integrity: Goal: Risk for impaired skin integrity will decrease Outcome: Progressing   Problem: Health Behavior/Discharge Planning: Goal: Ability to identify and utilize available resources and services will improve Outcome: Progressing Goal: Ability to manage health-related needs will improve Outcome: Progressing   Problem: Nutritional: Goal: Maintenance of adequate nutrition will improve Outcome: Progressing

## 2024-08-01 NOTE — Progress Notes (Signed)
 PROGRESS NOTE    Debra [name]  FMW:[id number] DOB: May 29, 1956 DOA: 07/26/2024 PCP: Antonio Cyndee Jamee JONELLE, DO    Brief Narrative:   Debra [name] is a 68 y.o. female with past medical history significant for chronic diastolic congestive heart failure, HTN, DMT2, major depressive disorder, generalized anxiety disorder, hypothyroidism, OSA, cognitive impairment who presented to [facility] ED on 07/26/2024 with generalized pain, nausea/vomiting, lethargy and near syncope.  In the ED, temperature 101.5 F rectally, HR 103, RR 22, BP 91/61, SpO2 94% on room air.  WBC 19.3, hemoglobin 10.8, platelet count 177.  Sodium 134, potassium 3.4, chloride 97, CO2 22, glucose 153, BUN 24, creat 1.36.  AST 28, ALT 16, total bilirubin 1.1.  High-sensitivity troponin less than 15.  Lactic acid 1.5.  COVID/influenza/RSV PCR negative.  Urinalysis with moderate leukocytes, positive nitrite, many bacteria, greater than 50 WBCs.  Current plan for skilled nursing facility.   Assessment & Plan:   Acute metabolic encephalopathy, POA: Improving Patient presenting with confusion, lethargy in the setting of fever, elevated WBC count, urinalysis consistent with urinary tract infection and abdominal imaging consistent with right-sided pyelonephritis.  CT head without contrast with no acute intracranial abnormality, noted chronic cerebral white matter disease stable from MRI prior year, extensive chronic suboccipital cervical spine surgery that is grossly stable since 2007. -- Supportive care, treatment as below -Delirium precautions  Severe sepsis, POA--E. coli bacteremia Urinary tract infection Right pyelonephritis Patient presenting to ED with confusion, generalized pain associate with nausea and vomiting.  Found to be febrile with temperature 101.5 F rectally, tachycardic, tachypneic with elevated WBC count of 19.3 and urinalysis consistent with urinary tract infection.  CT abdomen/pelvis with contrast with  abnormal right kidney and ureter consistent with a standing urinary tract infection/acute pyelonephritis, no other acute or inflammatory process within the abdomen/pelvis. -- WBCs trending down -- Blood culture: E. coli -- Urine culture: E. coli -- Ceftriaxone  changed to oral antibiotics as white blood cell count has normalized  Hypokalemia -Replete  Chronic diastolic congestive heart failure, compensated HTN -Resume home meds as able  HLD -- Pravastatin  40 mg p.o. daily  DM2 Currently diet controlled at baseline.  6.5 on 06/22/2024, well-controlled. -- CBGs qAC/HS  Hypothyroidism -- Levothyroxine  50 mcg p.o. daily  Generalized anxiety disorder Major depressive disorder -- Paxil  40 mg p.o. daily  Cognitive impairment Follows with neurology outpatient.  On donepezil . --Continue donepezil  5 mg p.o. daily --Delirium precautions  OSA -- Nocturnal CPAP  Weakness/debility/deconditioning: -- PT/OT evaluation-SNF placement  Hypokalemia - Replete  Of note, patient does not take Neurontin  TID at home-- takes once or twice daily per patient will adjust so she is not so sleepy   DVT prophylaxis: heparin injection 5,000 Units Start: 07/26/24 1700    Code Status: Full Code Family Communication: No family present at bedside this morning- called niece 10/4  Disposition Plan:  Level of care: Med-Surg Status is: Inpatient Remains inpatient appropriate because: Await SNF placement    Consultants:  None   Subjective: Did not sleep as well last night as prior evenings  Objective: Vitals:   07/31/24 1049 07/31/24 1344 07/31/24 2137 08/01/24 0510  BP: 134/75 (!) 153/79 (!) 148/121 (!) 147/77  Pulse: 81 77 92 74  Resp: 16 19 15 15   Temp: 98.1 F (36.7 C) 98.3 F (36.8 C) 99.8 F (37.7 C) 98.8 F (37.1 C)  TempSrc: Oral Oral Oral Oral  SpO2: 97% 96% 95% 100%  Weight:  Height:        Intake/Output Summary (Last 24 hours) at 08/01/2024 1059 Last data filed at  08/01/2024 0600 Gross per 24 hour  Intake 860 ml  Output 0 ml  Net 860 ml    Filed Weights   07/26/24 1846  Weight: 70.1 kg    Examination:  Physical Exam:  General: Appearance:     Overweight female in no acute distress     Lungs:     respirations unlabored  Heart:    Normal heart rate. .   MS:   All extremities are intact.   Neurologic:   Awake, alert       Data Reviewed: I have personally reviewed following labs and imaging studies  CBC: Recent Labs  Lab 07/26/24 0729 07/27/24 0440 07/28/24 0416 07/29/24 0356 08/01/24 0408  WBC 19.3* 19.1* 19.1* 10.5 13.5*  NEUTROABS 16.5*  --   --   --   --   HGB 10.8* 8.5* 9.2* 8.2* 8.0*  HCT 31.4* 25.8* 29.4* 25.8* 24.7*  MCV 83.7 86.3 87.5 86.3 85.8  PLT 177 148* 158 156 221   Basic Metabolic Panel: Recent Labs  Lab 07/27/24 0440 07/28/24 0416 07/29/24 0356 07/30/24 0354 08/01/24 0408  NA 138 137 137 140 138  K 3.0* 4.0 3.6 3.3* 3.7  CL 105 106 106 108 105  CO2 21* 21* 22 22 22   GLUCOSE 120* 103* 121* 112* 91  BUN 24* 21 16 12 8   CREATININE 1.03* 0.92 0.79 0.71 0.75  CALCIUM  8.6* 8.7* 8.4* 8.5* 8.5*  MG 1.4* 2.3  --   --   --   PHOS  --  1.4*  --   --   --    GFR: Estimated Creatinine Clearance: 60.9 mL/min (by C-G formula based on SCr of 0.75 mg/dL). Liver Function Tests: Recent Labs  Lab 07/26/24 0729  AST 28  ALT 16  ALKPHOS 100  BILITOT 1.1  PROT 7.7  ALBUMIN 3.8   No results for input(s): LIPASE, AMYLASE in the last 168 hours. No results for input(s): AMMONIA in the last 168 hours. Coagulation Profile: No results for input(s): INR, PROTIME in the last 168 hours. Cardiac Enzymes: No results for input(s): CKTOTAL, CKMB, CKMBINDEX, TROPONINI in the last 168 hours. BNP (last 3 results) No results for input(s): PROBNP in the last 8760 hours. HbA1C: No results for input(s): HGBA1C in the last 72 hours. CBG: Recent Labs  Lab 07/31/24 1134 07/31/24 1635 07/31/24 1727  07/31/24 2140 08/01/24 0736  GLUCAP 151* 58* 102* 100* 88   Lipid Profile: No results for input(s): CHOL, HDL, LDLCALC, TRIG, CHOLHDL, LDLDIRECT in the last 72 hours. Thyroid  Function Tests: No results for input(s): TSH, T4TOTAL, FREET4, T3FREE, THYROIDAB in the last 72 hours. Anemia Panel: No results for input(s): VITAMINB12, FOLATE, FERRITIN, TIBC, IRON, RETICCTPCT in the last 72 hours. Sepsis Labs: Recent Labs  Lab 07/26/24 0729 07/26/24 1211 07/26/24 1655  LATICACIDVEN 1.5 1.7 1.8    Recent Results (from the past 240 hours)  Resp panel by RT-PCR (RSV, Flu A&B, Covid) Anterior Nasal Swab     Status: None   Collection Time: 07/26/24  7:51 AM   Specimen: Anterior Nasal Swab  Result Value Ref Range Status   SARS Coronavirus 2 by RT PCR NEGATIVE NEGATIVE Final    Comment: (NOTE) SARS-CoV-2 target nucleic acids are NOT DETECTED.  The SARS-CoV-2 RNA is generally detectable in upper respiratory specimens during the acute phase of infection. The lowest concentration of SARS-CoV-2  viral copies this assay can detect is 138 copies/mL. A negative result does not preclude SARS-Cov-2 infection and should not be used as the sole basis for treatment or other patient management decisions. A negative result may occur with  improper specimen collection/handling, submission of specimen other than nasopharyngeal swab, presence of viral mutation(s) within the areas targeted by this assay, and inadequate number of viral copies(<138 copies/mL). A negative result must be combined with clinical observations, patient history, and epidemiological information. The expected result is Negative.  Fact Sheet for Patients:  BloggerCourse.com  Fact Sheet for Healthcare Providers:  SeriousBroker.it  This test is no t yet approved or cleared by the [place]  FDA and  has been authorized for detection and/or  diagnosis of SARS-CoV-2 by FDA under an Emergency Use Authorization (EUA). This EUA will remain  in effect (meaning this test can be used) for the duration of the COVID-19 declaration under Section 564(b)(1) of the Act, 21 U.S.C.section 360bbb-3(b)(1), unless the authorization is terminated  or revoked sooner.       Influenza A by PCR NEGATIVE NEGATIVE Final   Influenza B by PCR NEGATIVE NEGATIVE Final    Comment: (NOTE) The Xpert Xpress SARS-CoV-2/FLU/RSV plus assay is intended as an aid in the diagnosis of influenza from Nasopharyngeal swab specimens and should not be used as a sole basis for treatment. Nasal washings and aspirates are unacceptable for Xpert Xpress SARS-CoV-2/FLU/RSV testing.  Fact Sheet for Patients: BloggerCourse.com  Fact Sheet for Healthcare Providers: SeriousBroker.it  This test is not yet approved or cleared by the [place]  FDA and has been authorized for detection and/or diagnosis of SARS-CoV-2 by FDA under an Emergency Use Authorization (EUA). This EUA will remain in effect (meaning this test can be used) for the duration of the COVID-19 declaration under Section 564(b)(1) of the Act, 21 U.S.C. section 360bbb-3(b)(1), unless the authorization is terminated or revoked.     Resp Syncytial Virus by PCR NEGATIVE NEGATIVE Final    Comment: (NOTE) Fact Sheet for Patients: BloggerCourse.com  Fact Sheet for Healthcare Providers: SeriousBroker.it  This test is not yet approved or cleared by the [place]  FDA and has been authorized for detection and/or diagnosis of SARS-CoV-2 by FDA under an Emergency Use Authorization (EUA). This EUA will remain in effect (meaning this test can be used) for the duration of the COVID-19 declaration under Section 564(b)(1) of the Act, 21 U.S.C. section 360bbb-3(b)(1), unless the authorization is terminated  or revoked.  Performed at [facility], [address] [place], [place] [address]   Culture, blood (single)     Status: Abnormal   Collection Time: 07/26/24  9:39 AM   Specimen: BLOOD RIGHT FOREARM  Result Value Ref Range Status   Specimen Description BLOOD RIGHT FOREARM  Final   Special Requests   Final    BOTTLES DRAWN AEROBIC AND ANAEROBIC Blood Culture adequate volume   Culture  Setup Time   Final    GRAM NEGATIVE RODS IN BOTH AEROBIC AND ANAEROBIC BOTTLES CRITICAL RESULT CALLED TO, READ BACK BY AND VERIFIED WITH: PHARMD M. BELL [id number] AT 0027, ADC Performed at [facility], [address], [place], [place] [address]    Culture ESCHERICHIA COLI (A)  Final   Report Status 07/28/2024 FINAL  Final   Organism ID, Bacteria ESCHERICHIA COLI  Final      Susceptibility   Escherichia coli - MIC*    AMPICILLIN <=2 SENSITIVE Sensitive     CEFAZOLIN (NON-URINE) <=1  SENSITIVE Sensitive     CEFEPIME <=0.12 SENSITIVE Sensitive     ERTAPENEM <=0.12 SENSITIVE Sensitive     CEFTRIAXONE <=0.25 SENSITIVE Sensitive     CIPROFLOXACIN  <=0.06 SENSITIVE Sensitive     GENTAMICIN <=1 SENSITIVE Sensitive     MEROPENEM <=0.25 SENSITIVE Sensitive     TRIMETH /SULFA  <=20 SENSITIVE Sensitive     AMPICILLIN/SULBACTAM <=2 SENSITIVE Sensitive     PIP/TAZO Value in next row Sensitive      <=4 SENSITIVEThis is a modified FDA-approved test that has been validated and its performance characteristics determined by the reporting laboratory.  This laboratory is certified under the Clinical Laboratory Improvement Amendments CLIA as qualified to perform high complexity clinical laboratory testing.    * ESCHERICHIA COLI  Urine Culture     Status: Abnormal   Collection Time: 07/26/24  9:39 AM   Specimen: Urine, Random  Result Value Ref Range Status   Specimen Description   Final    URINE, RANDOM Performed at [facility], [address] [place], [place] [address]    Special Requests    Final    NONE Reflexed from [id number] Performed at [facility], [address] [place], [place] [address]    Culture (A)  Final    40,000 COLONIES/mL ESCHERICHIA COLI Two isolates with different morphologies were identified as the same organism.The most resistant organism was reported. Performed at [facility], [address], [place], [place] [address]    Report Status 07/28/2024 FINAL  Final   Organism ID, Bacteria ESCHERICHIA COLI (A)  Final      Susceptibility   Escherichia coli - MIC*    AMPICILLIN RESISTANT Resistant     CEFAZOLIN (URINE) Value in next row Resistant      >=32 RESISTANTThis is a modified FDA-approved test that has been validated and its performance characteristics determined by the reporting laboratory.  This laboratory is certified under the Clinical Laboratory Improvement Amendments CLIA as qualified to perform high complexity clinical laboratory testing.    CEFEPIME Value in next row Sensitive      >=32 RESISTANTThis is a modified FDA-approved test that has been validated and its performance characteristics determined by the reporting laboratory.  This laboratory is certified under the Clinical Laboratory Improvement Amendments CLIA as qualified to perform high complexity clinical laboratory testing.    ERTAPENEM Value in next row Sensitive      >=32 RESISTANTThis is a modified FDA-approved test that has been validated and its performance characteristics determined by the reporting laboratory.  This laboratory is certified under the Clinical Laboratory Improvement Amendments CLIA as qualified to perform high complexity clinical laboratory testing.    CEFTRIAXONE Value in next row Sensitive      >=32 RESISTANTThis is a modified FDA-approved test that has been validated and its performance characteristics determined by the reporting laboratory.  This laboratory is certified under the Clinical Laboratory Improvement Amendments CLIA as qualified to perform  high complexity clinical laboratory testing.    CIPROFLOXACIN  Value in next row Sensitive      >=32 RESISTANTThis is a modified FDA-approved test that has been validated and its performance characteristics determined by the reporting laboratory.  This laboratory is certified under the Clinical Laboratory Improvement Amendments CLIA as qualified to perform high complexity clinical laboratory testing.    GENTAMICIN Value in next row Sensitive      >=32 RESISTANTThis is a modified FDA-approved test that has been validated and its performance characteristics determined  by the reporting laboratory.  This laboratory is certified under the Clinical Laboratory Improvement Amendments CLIA as qualified to perform high complexity clinical laboratory testing.    NITROFURANTOIN Value in next row Sensitive      >=32 RESISTANTThis is a modified FDA-approved test that has been validated and its performance characteristics determined by the reporting laboratory.  This laboratory is certified under the Clinical Laboratory Improvement Amendments CLIA as qualified to perform high complexity clinical laboratory testing.    TRIMETH /SULFA  Value in next row Sensitive      >=32 RESISTANTThis is a modified FDA-approved test that has been validated and its performance characteristics determined by the reporting laboratory.  This laboratory is certified under the Clinical Laboratory Improvement Amendments CLIA as qualified to perform high complexity clinical laboratory testing.    AMPICILLIN/SULBACTAM Value in next row Sensitive      >=32 RESISTANTThis is a modified FDA-approved test that has been validated and its performance characteristics determined by the reporting laboratory.  This laboratory is certified under the Clinical Laboratory Improvement Amendments CLIA as qualified to perform high complexity clinical laboratory testing.    PIP/TAZO Value in next row Sensitive      <=4 SENSITIVEThis is a modified FDA-approved test  that has been validated and its performance characteristics determined by the reporting laboratory.  This laboratory is certified under the Clinical Laboratory Improvement Amendments CLIA as qualified to perform high complexity clinical laboratory testing.    MEROPENEM Value in next row Sensitive      <=4 SENSITIVEThis is a modified FDA-approved test that has been validated and its performance characteristics determined by the reporting laboratory.  This laboratory is certified under the Clinical Laboratory Improvement Amendments CLIA as qualified to perform high complexity clinical laboratory testing.    * 40,000 COLONIES/mL ESCHERICHIA COLI  Blood Culture ID Panel (Reflexed)     Status: Abnormal   Collection Time: 07/26/24  9:39 AM  Result Value Ref Range Status   Enterococcus faecalis NOT DETECTED NOT DETECTED Final   Enterococcus Faecium NOT DETECTED NOT DETECTED Final   Listeria monocytogenes NOT DETECTED NOT DETECTED Final   Staphylococcus species NOT DETECTED NOT DETECTED Final   Staphylococcus aureus (BCID) NOT DETECTED NOT DETECTED Final   Staphylococcus epidermidis NOT DETECTED NOT DETECTED Final   Staphylococcus lugdunensis NOT DETECTED NOT DETECTED Final   Streptococcus species NOT DETECTED NOT DETECTED Final   Streptococcus agalactiae NOT DETECTED NOT DETECTED Final   Streptococcus pneumoniae NOT DETECTED NOT DETECTED Final   Streptococcus pyogenes NOT DETECTED NOT DETECTED Final   A.calcoaceticus-baumannii NOT DETECTED NOT DETECTED Final   Bacteroides fragilis NOT DETECTED NOT DETECTED Final   Enterobacterales DETECTED (A) NOT DETECTED Final    Comment: Enterobacterales represent a large order of gram negative bacteria, not a single organism. CRITICAL RESULT CALLED TO, READ BACK BY AND VERIFIED WITH: PHARMD M. BELL [id number] AT 0027, ADC    Enterobacter cloacae complex NOT DETECTED NOT DETECTED Final   Escherichia coli DETECTED (A) NOT DETECTED Final    Comment: CRITICAL RESULT  CALLED TO, READ BACK BY AND VERIFIED WITH: PHARMD M. BELL [id number] AT 0027, ADC    Klebsiella aerogenes NOT DETECTED NOT DETECTED Final   Klebsiella oxytoca NOT DETECTED NOT DETECTED Final   Klebsiella pneumoniae NOT DETECTED NOT DETECTED Final   Proteus species NOT DETECTED NOT DETECTED Final   Salmonella species NOT DETECTED NOT DETECTED Final   Serratia marcescens NOT DETECTED NOT DETECTED Final   Haemophilus influenzae NOT DETECTED NOT DETECTED Final  Neisseria meningitidis NOT DETECTED NOT DETECTED Final   Pseudomonas aeruginosa NOT DETECTED NOT DETECTED Final   Stenotrophomonas maltophilia NOT DETECTED NOT DETECTED Final   Candida albicans NOT DETECTED NOT DETECTED Final   Candida auris NOT DETECTED NOT DETECTED Final   Candida glabrata NOT DETECTED NOT DETECTED Final   Candida krusei NOT DETECTED NOT DETECTED Final   Candida parapsilosis NOT DETECTED NOT DETECTED Final   Candida tropicalis NOT DETECTED NOT DETECTED Final   Cryptococcus neoformans/gattii NOT DETECTED NOT DETECTED Final   CTX-M ESBL NOT DETECTED NOT DETECTED Final   Carbapenem resistance IMP NOT DETECTED NOT DETECTED Final   Carbapenem resistance KPC NOT DETECTED NOT DETECTED Final   Carbapenem resistance NDM NOT DETECTED NOT DETECTED Final   Carbapenem resist OXA 48 LIKE NOT DETECTED NOT DETECTED Final   Carbapenem resistance VIM NOT DETECTED NOT DETECTED Final    Comment: Performed at [facility], [address] [place], [place] [address]         Radiology Studies: No results found.       Scheduled Meds:  cefadroxil  1,000 mg Oral BID   diclofenac Sodium  2 g Topical QID   donepezil   5 mg Oral Daily   gabapentin   400 mg Oral QHS   heparin  5,000 Units Subcutaneous Q8H   levothyroxine   50 mcg Oral Q0600   metoprolol  succinate  100 mg Oral Daily   oxybutynin   2.5 mg Oral BID   PARoxetine   40 mg Oral q morning   pravastatin   40 mg Oral Daily   saccharomyces boulardii  250 mg Oral BID    Continuous Infusions:     LOS: 6 days    Time spent: 35 minutes spent on 08/01/2024 caring for this patient face-to-face including chart review, ordering labs/tests, documenting, discussion with nursing staff, consultants, updating family and interview/physical exam    Debra [name] Bowl, DO [facility] Available via Epic secure chat 7am-7pm After these hours, please refer to coverage provider listed on [email or web address] 08/01/2024, 10:59 AM

## 2024-08-02 DIAGNOSIS — N12 Tubulo-interstitial nephritis, not specified as acute or chronic: Secondary | ICD-10-CM | POA: Diagnosis not present

## 2024-08-02 LAB — URINALYSIS, ROUTINE W REFLEX MICROSCOPIC
Bacteria, UA: NONE SEEN
Bilirubin Urine: NEGATIVE
Glucose, UA: NEGATIVE mg/dL
Hgb urine dipstick: NEGATIVE
Ketones, ur: NEGATIVE mg/dL
Leukocytes,Ua: NEGATIVE
Nitrite: NEGATIVE
Protein, ur: NEGATIVE mg/dL
Specific Gravity, Urine: 1.005 (ref 1.005–1.030)
pH: 7 (ref 5.0–8.0)

## 2024-08-02 LAB — CBC
HCT: 23.3 % — ABNORMAL LOW (ref 36.0–46.0)
Hemoglobin: 7.9 g/dL — ABNORMAL LOW (ref 12.0–15.0)
MCH: 28.2 pg (ref 26.0–34.0)
MCHC: 33.9 g/dL (ref 30.0–36.0)
MCV: 83.2 fL (ref 80.0–100.0)
Platelets: 252 K/uL (ref 150–400)
RBC: 2.8 MIL/uL — ABNORMAL LOW (ref 3.87–5.11)
RDW: 13.9 % (ref 11.5–15.5)
WBC: 14 K/uL — ABNORMAL HIGH (ref 4.0–10.5)
nRBC: 0 % (ref 0.0–0.2)

## 2024-08-02 LAB — GLUCOSE, CAPILLARY
Glucose-Capillary: 101 mg/dL — ABNORMAL HIGH (ref 70–99)
Glucose-Capillary: 141 mg/dL — ABNORMAL HIGH (ref 70–99)
Glucose-Capillary: 120 mg/dL — ABNORMAL HIGH (ref 70–99)
Glucose-Capillary: 137 mg/dL — ABNORMAL HIGH (ref 70–99)

## 2024-08-02 MED ORDER — GABAPENTIN 100 MG PO CAPS
200.0000 mg | ORAL_CAPSULE | Freq: Every day | ORAL | Status: DC
  Administered 2024-08-02: 200 mg via ORAL
  Filled 2024-08-02: qty 2

## 2024-08-02 MED ORDER — OXYBUTYNIN CHLORIDE 5 MG PO TABS
5.0000 mg | ORAL_TABLET | Freq: Two times a day (BID) | ORAL | Status: DC
  Administered 2024-08-02 – 2024-08-03 (×2): 5 mg via ORAL
  Filled 2024-08-02 (×2): qty 1

## 2024-08-02 NOTE — Plan of Care (Signed)
  Problem: Clinical Measurements: Goal: Will remain free from infection Outcome: Progressing Goal: Diagnostic test results will improve Outcome: Progressing Goal: Respiratory complications will improve Outcome: Progressing Goal: Cardiovascular complication will be avoided Outcome: Progressing   Problem: Activity: Goal: Risk for activity intolerance will decrease Outcome: Progressing   Problem: Nutrition: Goal: Adequate nutrition will be maintained Outcome: Progressing   Problem: Coping: Goal: Level of anxiety will decrease Outcome: Progressing   Problem: Elimination: Goal: Will not experience complications related to urinary retention Outcome: Progressing   Problem: Pain Managment: Goal: General experience of comfort will improve and/or be controlled Outcome: Progressing   Problem: Safety: Goal: Ability to remain free from injury will improve Outcome: Progressing   Problem: Skin Integrity: Goal: Risk for impaired skin integrity will decrease Outcome: Progressing   Problem: Skin Integrity: Goal: Risk for impaired skin integrity will decrease Outcome: Progressing

## 2024-08-02 NOTE — TOC Progression Note (Signed)
 Transition of Care [facility]) - Progression Note    Patient Details  Name: Debra [name] MRN: [id number] Date of Birth: February 24, 1956  Transition [facility] CM/SW Contact  Heather DELENA Saltness, LCSW Phone Number: 08/02/2024, 3:43 PM  Clinical Narrative:    Pt's insurance authorization for SNF rehab at [facility] has been approved. Auth ID: [id number]. CSW notified admissions staff at [facility]. Pt to [facility]/7.   Expected Discharge Plan:  (TBD) Barriers to Discharge: Continued Medical Work up   Expected Discharge Plan and Services In-house Referral: Clinical Social Work     Living arrangements for the past 2 months: Single Family Home    Social Drivers of Health (SDOH) Interventions SDOH Screenings   Food Insecurity: No Food Insecurity (07/26/2024)  Housing: Low Risk  (07/26/2024)  Transportation Needs: No Transportation Needs (07/27/2024)  Utilities: Not At Risk (07/26/2024)  Alcohol Screen: Low Risk  (01/06/2024)  Depression (PHQ2-9): High Risk (03/17/2024)  Financial Resource Strain: Low Risk  (01/06/2024)  Physical Activity: Inactive (01/06/2024)  Social Connections: Moderately Isolated (07/26/2024)  Stress: Stress Concern Present (01/06/2024)  Tobacco Use: High Risk (07/26/2024)  Health Literacy: Adequate Health Literacy (01/06/2024)    Readmission Risk Interventions     No data to display          Signed: Heather Saltness, MSW, LCSW Clinical Social Worker Inpatient Care Management 08/02/2024 3:46 PM

## 2024-08-02 NOTE — Progress Notes (Signed)
 PROGRESS NOTE    Debra [name]  FMW:[id number] DOB: [birth year] DOA: 07/26/2024 PCP: Antonio Cyndee Jamee JONELLE, DO    Brief Narrative:   Debra [name] is a 68 y.o. female with past medical history significant for chronic diastolic congestive heart failure, HTN, DMT2, major depressive disorder, generalized anxiety disorder, hypothyroidism, OSA, cognitive impairment who presented to [facility] ED on 07/26/2024 with generalized pain, nausea/vomiting, lethargy and near syncope.  In the ED, temperature 101.5 F rectally, HR 103, RR 22, BP 91/61, SpO2 94% on room air.  WBC 19.3, hemoglobin 10.8, platelet count 177.  Sodium 134, potassium 3.4, chloride 97, CO2 22, glucose 153, BUN 24, creat 1.36.  AST 28, ALT 16, total bilirubin 1.1.  High-sensitivity troponin less than 15.  Lactic acid 1.5.  COVID/influenza/RSV PCR negative.  Urinalysis with moderate leukocytes, positive nitrite, many bacteria, greater than 50 WBCs.  Current plan for skilled nursing facility.   Assessment & Plan:   Acute metabolic encephalopathy, POA: Improving Patient presenting with confusion, lethargy in the setting of fever, elevated WBC count, urinalysis consistent with urinary tract infection and abdominal imaging consistent with right-sided pyelonephritis.  CT head without contrast with no acute intracranial abnormality, noted chronic cerebral white matter disease stable from MRI prior year, extensive chronic suboccipital cervical spine surgery that is grossly stable since 2007. -- Supportive care, treatment as below -Delirium precautions  Severe sepsis, POA--E. coli bacteremia Urinary tract infection Right pyelonephritis Patient presenting to ED with confusion, generalized pain associate with nausea and vomiting.  Found to be febrile with temperature 101.5 F rectally, tachycardic, tachypneic with elevated WBC count of 19.3 and urinalysis consistent with urinary tract infection.  CT abdomen/pelvis with contrast with  abnormal right kidney and ureter consistent with a standing urinary tract infection/acute pyelonephritis, no other acute or inflammatory process within the abdomen/pelvis. -- WBCs trending down -- Blood culture: E. coli -- Urine culture: E. coli -- Ceftriaxone  changed to oral antibiotics as white blood cell count has normalized--of note the last 2 days white blood cell count has been trending up-repeat UA shows clearing of infection.  Thankfully patient does not have a fever and seems to have stable vital signs  Hypokalemia -Replete  Chronic diastolic congestive heart failure, compensated HTN -Resume home meds as able  HLD -- Pravastatin  40 mg p.o. daily  DM2 Currently diet controlled at baseline.  6.5 on 06/22/2024, well-controlled. -- CBGs qAC/HS  Hypothyroidism -- Levothyroxine  50 mcg p.o. daily  Generalized anxiety disorder Major depressive disorder -- Paxil  40 mg p.o. daily  Cognitive impairment Follows with neurology outpatient.  On donepezil . --Continue donepezil  5 mg p.o. daily --Delirium precautions  OSA -- Nocturnal CPAP  Weakness/debility/deconditioning: -- PT/OT evaluation-SNF placement  Hypokalemia - Replete  Of note, patient does not take Neurontin  TID at home-- takes once or twice daily per patient will adjust so she is not so sleepy   DVT prophylaxis: heparin injection 5,000 Units Start: 07/26/24 1700    Code Status: Full Code Family Communication: No family present at bedside this morning- called niece 10/4  Disposition Plan:  Level of care: Med-Surg Status is: Inpatient Remains inpatient appropriate because: Await SNF placement    Consultants:  None   Subjective: Still having urgency issues  Objective: Vitals:   08/01/24 1325 08/01/24 2155 08/02/24 0618 08/02/24 1001  BP: (!) 149/61 (!) 160/83 (!) 150/84 (!) 150/84  Pulse: 65 75 69 69  Resp: 17 16 15    Temp: 98.3 F (36.8 C) 98.2 F (36.8  C) 98 F (36.7 C)   TempSrc: Oral Oral  Oral   SpO2: 98% 97% 96%   Weight:      Height:        Intake/Output Summary (Last 24 hours) at 08/02/2024 1220 Last data filed at 08/02/2024 1000 Gross per 24 hour  Intake 340 ml  Output 0 ml  Net 340 ml    Filed Weights   07/26/24 1846  Weight: 70.1 kg    Examination:  Physical Exam:  General: Appearance:     Overweight female in no acute distress     Lungs:     respirations unlabored  Heart:    Normal heart rate. .   MS:   All extremities are intact.   Neurologic:   Awake, alert       Data Reviewed: I have personally reviewed following labs and imaging studies  CBC: Recent Labs  Lab 07/27/24 0440 07/28/24 0416 07/29/24 0356 08/01/24 0408 08/02/24 0321  WBC 19.1* 19.1* 10.5 13.5* 14.0*  HGB 8.5* 9.2* 8.2* 8.0* 7.9*  HCT 25.8* 29.4* 25.8* 24.7* 23.3*  MCV 86.3 87.5 86.3 85.8 83.2  PLT 148* 158 156 221 252   Basic Metabolic Panel: Recent Labs  Lab 07/27/24 0440 07/28/24 0416 07/29/24 0356 07/30/24 0354 08/01/24 0408  NA 138 137 137 140 138  K 3.0* 4.0 3.6 3.3* 3.7  CL 105 106 106 108 105  CO2 21* 21* 22 22 22   GLUCOSE 120* 103* 121* 112* 91  BUN 24* 21 16 12 8   CREATININE 1.03* 0.92 0.79 0.71 0.75  CALCIUM  8.6* 8.7* 8.4* 8.5* 8.5*  MG 1.4* 2.3  --   --   --   PHOS  --  1.4*  --   --   --    GFR: Estimated Creatinine Clearance: 60.9 mL/min (by C-G formula based on SCr of 0.75 mg/dL). Liver Function Tests: No results for input(s): AST, ALT, ALKPHOS, BILITOT, PROT, ALBUMIN in the last 168 hours.  No results for input(s): LIPASE, AMYLASE in the last 168 hours. No results for input(s): AMMONIA in the last 168 hours. Coagulation Profile: No results for input(s): INR, PROTIME in the last 168 hours. Cardiac Enzymes: No results for input(s): CKTOTAL, CKMB, CKMBINDEX, TROPONINI in the last 168 hours. BNP (last 3 results) No results for input(s): PROBNP in the last 8760 hours. HbA1C: No results for input(s):  HGBA1C in the last 72 hours. CBG: Recent Labs  Lab 08/01/24 1151 08/01/24 1640 08/01/24 2153 08/02/24 0745 08/02/24 1146  GLUCAP 132* 174* 112* 101* 141*   Lipid Profile: No results for input(s): CHOL, HDL, LDLCALC, TRIG, CHOLHDL, LDLDIRECT in the last 72 hours. Thyroid  Function Tests: No results for input(s): TSH, T4TOTAL, FREET4, T3FREE, THYROIDAB in the last 72 hours. Anemia Panel: No results for input(s): VITAMINB12, FOLATE, FERRITIN, TIBC, IRON, RETICCTPCT in the last 72 hours. Sepsis Labs: Recent Labs  Lab 07/26/24 1655  LATICACIDVEN 1.8    Recent Results (from the past 240 hours)  Resp panel by RT-PCR (RSV, Flu A&B, Covid) Anterior Nasal Swab     Status: None   Collection Time: 07/26/24  7:51 AM   Specimen: Anterior Nasal Swab  Result Value Ref Range Status   SARS Coronavirus 2 by RT PCR NEGATIVE NEGATIVE Final    Comment: (NOTE) SARS-CoV-2 target nucleic acids are NOT DETECTED.  The SARS-CoV-2 RNA is generally detectable in upper respiratory specimens during the acute phase of infection. The lowest concentration of SARS-CoV-2 viral copies this assay can detect  is 138 copies/mL. A negative result does not preclude SARS-Cov-2 infection and should not be used as the sole basis for treatment or other patient management decisions. A negative result may occur with  improper specimen collection/handling, submission of specimen other than nasopharyngeal swab, presence of viral mutation(s) within the areas targeted by this assay, and inadequate number of viral copies(<138 copies/mL). A negative result must be combined with clinical observations, patient history, and epidemiological information. The expected result is Negative.  Fact Sheet for Patients:  BloggerCourse.com  Fact Sheet for Healthcare Providers:  SeriousBroker.it  This test is no t yet approved or cleared by the [place] FDA and  has been authorized for detection and/or diagnosis of SARS-CoV-2 by FDA under an Emergency Use Authorization (EUA). This EUA will remain  in effect (meaning this test can be used) for the duration of the COVID-19 declaration under Section 564(b)(1) of the Act, 21 U.S.C.section 360bbb-3(b)(1), unless the authorization is terminated  or revoked sooner.       Influenza A by PCR NEGATIVE NEGATIVE Final   Influenza B by PCR NEGATIVE NEGATIVE Final    Comment: (NOTE) The Xpert Xpress SARS-CoV-2/FLU/RSV plus assay is intended as an aid in the diagnosis of influenza from Nasopharyngeal swab specimens and should not be used as a sole basis for treatment. Nasal washings and aspirates are unacceptable for Xpert Xpress SARS-CoV-2/FLU/RSV testing.  Fact Sheet for Patients: BloggerCourse.com  Fact Sheet for Healthcare Providers: SeriousBroker.it  This test is not yet approved or cleared by the [place]  FDA and has been authorized for detection and/or diagnosis of SARS-CoV-2 by FDA under an Emergency Use Authorization (EUA). This EUA will remain in effect (meaning this test can be used) for the duration of the COVID-19 declaration under Section 564(b)(1) of the Act, 21 U.S.C. section 360bbb-3(b)(1), unless the authorization is terminated or revoked.     Resp Syncytial Virus by PCR NEGATIVE NEGATIVE Final    Comment: (NOTE) Fact Sheet for Patients: BloggerCourse.com  Fact Sheet for Healthcare Providers: SeriousBroker.it  This test is not yet approved or cleared by the [place]  FDA and has been authorized for detection and/or diagnosis of SARS-CoV-2 by FDA under an Emergency Use Authorization (EUA). This EUA will remain in effect (meaning this test can be used) for the duration of the COVID-19 declaration under Section 564(b)(1) of the Act, 21 U.S.C. section  360bbb-3(b)(1), unless the authorization is terminated or revoked.  Performed at [facility], [address] [place], [place] [address]   Culture, blood (single)     Status: Abnormal   Collection Time: 07/26/24  9:39 AM   Specimen: BLOOD RIGHT FOREARM  Result Value Ref Range Status   Specimen Description BLOOD RIGHT FOREARM  Final   Special Requests   Final    BOTTLES DRAWN AEROBIC AND ANAEROBIC Blood Culture adequate volume   Culture  Setup Time   Final    GRAM NEGATIVE RODS IN BOTH AEROBIC AND ANAEROBIC BOTTLES CRITICAL RESULT CALLED TO, READ BACK BY AND VERIFIED WITH: PHARMD M. BELL [id number] AT 0027, ADC Performed at [facility], [address], [place], [place] [address]    Culture ESCHERICHIA COLI (A)  Final   Report Status 07/28/2024 FINAL  Final   Organism ID, Bacteria ESCHERICHIA COLI  Final      Susceptibility   Escherichia coli - MIC*    AMPICILLIN <=2 SENSITIVE Sensitive     CEFAZOLIN (NON-URINE) <=1 SENSITIVE Sensitive  CEFEPIME <=0.12 SENSITIVE Sensitive     ERTAPENEM <=0.12 SENSITIVE Sensitive     CEFTRIAXONE <=0.25 SENSITIVE Sensitive     CIPROFLOXACIN  <=0.06 SENSITIVE Sensitive     GENTAMICIN <=1 SENSITIVE Sensitive     MEROPENEM <=0.25 SENSITIVE Sensitive     TRIMETH /SULFA  <=20 SENSITIVE Sensitive     AMPICILLIN/SULBACTAM <=2 SENSITIVE Sensitive     PIP/TAZO Value in next row Sensitive      <=4 SENSITIVEThis is a modified FDA-approved test that has been validated and its performance characteristics determined by the reporting laboratory.  This laboratory is certified under the Clinical Laboratory Improvement Amendments CLIA as qualified to perform high complexity clinical laboratory testing.    * ESCHERICHIA COLI  Urine Culture     Status: Abnormal   Collection Time: 07/26/24  9:39 AM   Specimen: Urine, Random  Result Value Ref Range Status   Specimen Description   Final    URINE, RANDOM Performed at [facility], [address] [place], [place] [address]    Special Requests   Final    NONE Reflexed from [id number] Performed at [facility], [address] [place], [place] [address]    Culture (A)  Final    40,000 COLONIES/mL ESCHERICHIA COLI Two isolates with different morphologies were identified as the same organism.The most resistant organism was reported. Performed at [facility], [address], [place], [place] [address]    Report Status 07/28/2024 FINAL  Final   Organism ID, Bacteria ESCHERICHIA COLI (A)  Final      Susceptibility   Escherichia coli - MIC*    AMPICILLIN RESISTANT Resistant     CEFAZOLIN (URINE) Value in next row Resistant      >=32 RESISTANTThis is a modified FDA-approved test that has been validated and its performance characteristics determined by the reporting laboratory.  This laboratory is certified under the Clinical Laboratory Improvement Amendments CLIA as qualified to perform high complexity clinical laboratory testing.    CEFEPIME Value in next row Sensitive      >=32 RESISTANTThis is a modified FDA-approved test that has been validated and its performance characteristics determined by the reporting laboratory.  This laboratory is certified under the Clinical Laboratory Improvement Amendments CLIA as qualified to perform high complexity clinical laboratory testing.    ERTAPENEM Value in next row Sensitive      >=32 RESISTANTThis is a modified FDA-approved test that has been validated and its performance characteristics determined by the reporting laboratory.  This laboratory is certified under the Clinical Laboratory Improvement Amendments CLIA as qualified to perform high complexity clinical laboratory testing.    CEFTRIAXONE Value in next row Sensitive      >=32 RESISTANTThis is a modified FDA-approved test that has been validated and its performance characteristics determined by the reporting laboratory.  This laboratory is certified under the Clinical  Laboratory Improvement Amendments CLIA as qualified to perform high complexity clinical laboratory testing.    CIPROFLOXACIN  Value in next row Sensitive      >=32 RESISTANTThis is a modified FDA-approved test that has been validated and its performance characteristics determined by the reporting laboratory.  This laboratory is certified under the Clinical Laboratory Improvement Amendments CLIA as qualified to perform high complexity clinical laboratory testing.    GENTAMICIN Value in next row Sensitive      >=32 RESISTANTThis is a modified FDA-approved test that has been validated and its performance characteristics determined by the reporting laboratory.  This  laboratory is certified under the Clinical Laboratory Improvement Amendments CLIA as qualified to perform high complexity clinical laboratory testing.    NITROFURANTOIN Value in next row Sensitive      >=32 RESISTANTThis is a modified FDA-approved test that has been validated and its performance characteristics determined by the reporting laboratory.  This laboratory is certified under the Clinical Laboratory Improvement Amendments CLIA as qualified to perform high complexity clinical laboratory testing.    TRIMETH /SULFA  Value in next row Sensitive      >=32 RESISTANTThis is a modified FDA-approved test that has been validated and its performance characteristics determined by the reporting laboratory.  This laboratory is certified under the Clinical Laboratory Improvement Amendments CLIA as qualified to perform high complexity clinical laboratory testing.    AMPICILLIN/SULBACTAM Value in next row Sensitive      >=32 RESISTANTThis is a modified FDA-approved test that has been validated and its performance characteristics determined by the reporting laboratory.  This laboratory is certified under the Clinical Laboratory Improvement Amendments CLIA as qualified to perform high complexity clinical laboratory testing.    PIP/TAZO Value in next row  Sensitive      <=4 SENSITIVEThis is a modified FDA-approved test that has been validated and its performance characteristics determined by the reporting laboratory.  This laboratory is certified under the Clinical Laboratory Improvement Amendments CLIA as qualified to perform high complexity clinical laboratory testing.    MEROPENEM Value in next row Sensitive      <=4 SENSITIVEThis is a modified FDA-approved test that has been validated and its performance characteristics determined by the reporting laboratory.  This laboratory is certified under the Clinical Laboratory Improvement Amendments CLIA as qualified to perform high complexity clinical laboratory testing.    * 40,000 COLONIES/mL ESCHERICHIA COLI  Blood Culture ID Panel (Reflexed)     Status: Abnormal   Collection Time: 07/26/24  9:39 AM  Result Value Ref Range Status   Enterococcus faecalis NOT DETECTED NOT DETECTED Final   Enterococcus Faecium NOT DETECTED NOT DETECTED Final   Listeria monocytogenes NOT DETECTED NOT DETECTED Final   Staphylococcus species NOT DETECTED NOT DETECTED Final   Staphylococcus aureus (BCID) NOT DETECTED NOT DETECTED Final   Staphylococcus epidermidis NOT DETECTED NOT DETECTED Final   Staphylococcus lugdunensis NOT DETECTED NOT DETECTED Final   Streptococcus species NOT DETECTED NOT DETECTED Final   Streptococcus agalactiae NOT DETECTED NOT DETECTED Final   Streptococcus pneumoniae NOT DETECTED NOT DETECTED Final   Streptococcus pyogenes NOT DETECTED NOT DETECTED Final   A.calcoaceticus-baumannii NOT DETECTED NOT DETECTED Final   Bacteroides fragilis NOT DETECTED NOT DETECTED Final   Enterobacterales DETECTED (A) NOT DETECTED Final    Comment: Enterobacterales represent a large order of gram negative bacteria, not a single organism. CRITICAL RESULT CALLED TO, READ BACK BY AND VERIFIED WITH: PHARMD M. BELL [id number] AT 0027, ADC    Enterobacter cloacae complex NOT DETECTED NOT DETECTED Final   Escherichia  coli DETECTED (A) NOT DETECTED Final    Comment: CRITICAL RESULT CALLED TO, READ BACK BY AND VERIFIED WITH: PHARMD M. BELL [id number] AT 0027, ADC    Klebsiella aerogenes NOT DETECTED NOT DETECTED Final   Klebsiella oxytoca NOT DETECTED NOT DETECTED Final   Klebsiella pneumoniae NOT DETECTED NOT DETECTED Final   Proteus species NOT DETECTED NOT DETECTED Final   Salmonella species NOT DETECTED NOT DETECTED Final   Serratia marcescens NOT DETECTED NOT DETECTED Final   Haemophilus influenzae NOT DETECTED NOT DETECTED Final   Neisseria meningitidis NOT DETECTED  NOT DETECTED Final   Pseudomonas aeruginosa NOT DETECTED NOT DETECTED Final   Stenotrophomonas maltophilia NOT DETECTED NOT DETECTED Final   Candida albicans NOT DETECTED NOT DETECTED Final   Candida auris NOT DETECTED NOT DETECTED Final   Candida glabrata NOT DETECTED NOT DETECTED Final   Candida krusei NOT DETECTED NOT DETECTED Final   Candida parapsilosis NOT DETECTED NOT DETECTED Final   Candida tropicalis NOT DETECTED NOT DETECTED Final   Cryptococcus neoformans/gattii NOT DETECTED NOT DETECTED Final   CTX-M ESBL NOT DETECTED NOT DETECTED Final   Carbapenem resistance IMP NOT DETECTED NOT DETECTED Final   Carbapenem resistance KPC NOT DETECTED NOT DETECTED Final   Carbapenem resistance NDM NOT DETECTED NOT DETECTED Final   Carbapenem resist OXA 48 LIKE NOT DETECTED NOT DETECTED Final   Carbapenem resistance VIM NOT DETECTED NOT DETECTED Final    Comment: Performed at [facility], [address] [place], [place] [address]         Radiology Studies: No results found.       Scheduled Meds:  cefadroxil  1,000 mg Oral BID   diclofenac Sodium  2 g Topical QID   donepezil   5 mg Oral Daily   gabapentin   200 mg Oral QHS   heparin  5,000 Units Subcutaneous Q8H   levothyroxine   50 mcg Oral Q0600   metoprolol  succinate  100 mg Oral Daily   oxybutynin   2.5 mg Oral BID   PARoxetine   40 mg Oral q morning    pravastatin   40 mg Oral Daily   saccharomyces boulardii  250 mg Oral BID   Continuous Infusions:     LOS: 7 days    Time spent: 35 minutes spent on 08/02/2024 caring for this patient face-to-face including chart review, ordering labs/tests, documenting, discussion with nursing staff, consultants, updating family and interview/physical exam    Harlene RAYMOND Bowl, DO [facility] Available via Epic secure chat 7am-7pm After these hours, please refer to coverage provider listed on [email or web address] 08/02/2024, 12:20 PM

## 2024-08-02 NOTE — Plan of Care (Signed)
  Problem: Education: Goal: Knowledge of General Education information will improve Description: Including pain rating scale, medication(s)/side effects and non-pharmacologic comfort measures Outcome: Progressing   Problem: Clinical Measurements: Goal: Ability to maintain clinical measurements within normal limits will improve Outcome: Progressing   Problem: Activity: Goal: Risk for activity intolerance will decrease Outcome: Progressing   Problem: Nutrition: Goal: Adequate nutrition will be maintained Outcome: Progressing   Problem: Coping: Goal: Level of anxiety will decrease Outcome: Progressing   Problem: Elimination: Goal: Will not experience complications related to bowel motility Outcome: Progressing   Problem: Pain Managment: Goal: General experience of comfort will improve and/or be controlled Outcome: Progressing   Problem: Safety: Goal: Ability to remain free from injury will improve Outcome: Progressing   Problem: Skin Integrity: Goal: Risk for impaired skin integrity will decrease Outcome: Progressing

## 2024-08-02 NOTE — Progress Notes (Signed)
 Physical Therapy Treatment Patient Details Name: Debra [name] MRN: [id number] DOB: 1956-01-03 Today's Date: 08/02/2024   History of Present Illness Patient is a 68 yo female presenting to the ED with confusion, lethargy, chest and abdominal pain and near syncopal episode on 07/26/24. CT head clear. Admitted with pyelonephritis and sepsis. PMH includes: chronic diastolic congestive heart failure, HTN, DMT2, major depressive disorder, generalized anxiety disorder, hypothyroidism, OSA, cognitive impairment    PT Comments  AxO x 2.5 improving but still present with impaired safety awarness, problem solving and multi tasking.  Easily distracted and at times required repeat directions. At times frustrated with word finding.  Prior Pt was home alone, IND, Driving and shopping.   Assisted OOB to amb to the bathroom present with balance impairments.  General transfer comment: impulsive.  Impaired self safety awareness.  Required assist for balance correction with multitasking while in bathroom performing standing peri care and donning/doffing under pants.  HIGH FALL RISK as Pt presented with delayed self correction and impaired self awareness of midline.  Drunken movements. General Gait Details: Very unsteady gait present with staggering right/left and need to steady self using hall rail.  Poor/delayed self correction response.  HIGH FALL RISK.  Impulsive with impaired self safety awareness.  Increased balance instability with turns and back steps.  I feel a little off, stated Pt. BERG balance test score 31/56 indicating 100% fall risk.   LPT has rec Pt will need ST Rehab at SNF to address balance, mobility and functional decline prior to safely returning home alone.     If plan is discharge home, recommend the following: Supervision due to cognitive status;Help with stairs or ramp for entrance;Assist for transportation;Direct supervision/assist for financial management;Direct supervision/assist for  medications management;Assistance with cooking/housework;A little help with bathing/dressing/bathroom;A little help with walking and/or transfers   Can travel by private vehicle     Yes  Equipment Recommendations  None recommended by PT    Recommendations for Other Services       Precautions / Restrictions Precautions Precautions: Fall Restrictions Weight Bearing Restrictions Per Provider Order: No     Mobility  Bed Mobility Overal bed mobility: Modified Independent             General bed mobility comments: Pt self able to perform all bed mobility.  Slightly impulsive.    Transfers Overall transfer level: Needs assistance Equipment used: 1 person hand held assist Transfers: Sit to/from Stand, Bed to chair/wheelchair/BSC Sit to Stand: Supervision, Contact guard assist Stand pivot transfers: Contact guard assist, Min assist         General transfer comment: impulsive.  Impaired self safety awareness.  Required assist for balance correction with multitasking while in bathroom performing standing peri care and donning/doffing under pants.  HIGH FALL RISK as Pt presented with delayed self correction and impaired self awareness of midline.  Drunken movements.    Ambulation/Gait Ambulation/Gait assistance: Min assist, Mod assist Gait Distance (Feet): 52 Feet Assistive device: None Gait Pattern/deviations: Step-through pattern, Decreased stride length Gait velocity: decreased     General Gait Details: Very unsteady gait present with staggering right/left and need to steady self using hall rail.  Poor/delayed self correction response.  HIGH FALL RISK.  Impulsive with impaired self safety awareness.  Increased balance instability with turns and back steps.  I feel a little off, stated Pt.   Stairs             Wheelchair Mobility     Tilt Bed  Modified Rankin (Stroke Patients Only)       Balance Overall balance assessment: Needs assistance                                Standardized Balance Assessment Standardized Balance Assessment : Berg Balance Test Berg Balance Test Sit to Stand: Able to stand  independently using hands Standing Unsupported: Able to stand 2 minutes with supervision Sitting with Back Unsupported but Feet Supported on Floor or Stool: Able to sit safely and securely 2 minutes Stand to Sit: Uses backs of legs against chair to control descent Transfers: Able to transfer with verbal cueing and /or supervision Standing Unsupported with Eyes Closed: Able to stand 10 seconds with supervision Standing Ubsupported with Feet Together: Needs help to attain position but able to stand for 30 seconds with feet together From Standing, Reach Forward with Outstretched Arm: Can reach forward >12 cm safely (5) From Standing Position, Pick up Object from Floor: Able to pick up shoe, needs supervision From Standing Position, Turn to Look Behind Over each Shoulder: Looks behind one side only/other side shows less weight shift Turn 360 Degrees: Able to turn 360 degrees safely one side only in 4 seconds or less Standing Unsupported, Alternately Place Feet on Step/Stool: Able to complete >2 steps/needs minimal assist Standing Unsupported, One Foot in Front: Loses balance while stepping or standing Standing on One Leg: Unable to try or needs assist to prevent fall Total Score: 31        Communication Communication Communication: No apparent difficulties  Cognition Arousal: Alert Behavior During Therapy: Impulsive, Restless   PT - Cognitive impairments: Problem solving, Safety/Judgement                       PT - Cognition Comments: AxO x 2.5 improving but still present with impaired safety awarness, problem solving and multi tasking.  Easily distracted and at times required repeat directions. At times frustrated with word finding.  Prior Pt was home alone, IND, Driving and shopping. Following commands:  Impaired Following commands impaired: Follows multi-step commands inconsistently, Follows one step commands inconsistently    Cueing Cueing Techniques: Gestural cues, Verbal cues, Tactile cues  Exercises      General Comments General comments (skin integrity, edema, etc.): BERG balance score 31/56 indicating 100% fall risk      Pertinent Vitals/Pain Pain Assessment Faces Pain Scale: Hurts a little bit Pain Location: general stiffness all over Pain Descriptors / Indicators: Discomfort, Grimacing, Guarding Pain Intervention(s): Monitored during session    Home Living                          Prior Function            PT Goals (current goals can now be found in the care plan section) Progress towards PT goals: Progressing toward goals    Frequency    Min 2X/week      PT Plan      Co-evaluation              AM-PAC PT 6 Clicks Mobility   Outcome Measure  Help needed turning from your back to your side while in a flat bed without using bedrails?: None Help needed moving from lying on your back to sitting on the side of a flat bed without using bedrails?: None Help needed moving to and from a bed to  a chair (including a wheelchair)?: A Little Help needed standing up from a chair using your arms (e.g., wheelchair or bedside chair)?: A Little Help needed to walk in hospital room?: A Lot Help needed climbing 3-5 steps with a railing? : A Lot 6 Click Score: 18    End of Session Equipment Utilized During Treatment: Gait belt Activity Tolerance: Patient limited by fatigue Patient left: in chair;with call bell/phone within reach;with chair alarm set Nurse Communication: Mobility status PT Visit Diagnosis: Unsteadiness on feet (R26.81);Muscle weakness (generalized) (M62.81)     Time: 8963-8947 PT Time Calculation (min) (ACUTE ONLY): 16 min  Charges:    $Gait Training: 8-22 mins PT General Charges $$ ACUTE PT VISIT: 1 Visit                      Katheryn Leap  PTA Acute  Rehabilitation Services Office M-F          [phone]

## 2024-08-02 NOTE — TOC Progression Note (Addendum)
 Transition of Care [facility]) - Progression Note    Patient Details  Name: Debra [name] MRN: [id number] Date of Birth: Aug 27, 1956  Transition of Care [facility]) CM/SW Contact  Heather DELENA Saltness, LCSW Phone Number: 08/02/2024, 11:00 AM  Clinical Narrative:     ADDENDUM  CSW met with pt at bedside and spoke with sister, [name] [phone], via speaker phone to discuss SNF bed choice. Pt and sister choose bed at [facility]. CSW notified [facility] admissions staff. Insurance authorization requested, currently pending. TOC will continue to follow.  CSW met with pt at bedside to discuss SNF bed offers and obtain pt's bed choice. CSW provided pt with list of facilities with available beds, including name of facility, location, and Medicare Star-Ratings. Pt reports she will discuss bed offers with sister before making decision. CSW will follow up with pt this afternoon to obtain bed choice.  Medicare Star-Ratings  [facility] [address] [place], [place] [address] [phone] Overall rating ?????  [facility] [address] [place], [place] [address] [phone] Overall rating ? Much below average  [facility] [address] [place], [place] [address] [phone] Overall rating ?? Much below average  [facility] [address] [place], [place] [address] [phone] Overall rating ?? Much below average   [facility] [address] [place], [place] [address] [phone] Overall rating ?? Much below average  [facility] [address] [place], [place] [address] [phone] Overall rating ?? Below average  [facility] [address] [place], [place] [address] [phone] Overall rating ???? Below average  [facility] [address] [place], [place] [address] [phone] Overall rating ? Below average   Expected Discharge Plan:  (TBD) Barriers to Discharge: Continued Medical Work up   Expected Discharge Plan and Services In-house Referral: Clinical Social Work     Living arrangements for the past 2 months: Single Family Home                   Social Drivers of Health (SDOH) Interventions SDOH Screenings   Food Insecurity: No Food Insecurity (07/26/2024)  Housing: Low Risk  (07/26/2024)  Transportation Needs: No Transportation Needs (07/27/2024)  Utilities: Not At Risk (07/26/2024)  Alcohol Screen: Low Risk  (01/06/2024)  Depression (PHQ2-9): High Risk (03/17/2024)  Financial Resource Strain: Low Risk  (01/06/2024)  Physical Activity: Inactive (01/06/2024)  Social Connections: Moderately Isolated (07/26/2024)  Stress: Stress Concern Present (01/06/2024)  Tobacco Use: High Risk (07/26/2024)  Health Literacy: Adequate Health Literacy (01/06/2024)    Readmission Risk Interventions     No data to display          Signed: Heather Saltness, MSW, LCSW Clinical Social Worker Inpatient Care Management 08/02/2024 11:04 AM

## 2024-08-03 DIAGNOSIS — N12 Tubulo-interstitial nephritis, not specified as acute or chronic: Secondary | ICD-10-CM | POA: Diagnosis not present

## 2024-08-03 LAB — CBC WITH DIFFERENTIAL/PLATELET
Abs Immature Granulocytes: 0.19 K/uL — ABNORMAL HIGH (ref 0.00–0.07)
Basophils Absolute: 0 K/uL (ref 0.0–0.1)
Basophils Relative: 0 %
Eosinophils Absolute: 0.2 K/uL (ref 0.0–0.5)
Eosinophils Relative: 1 %
HCT: 23.5 % — ABNORMAL LOW (ref 36.0–46.0)
Hemoglobin: 7.7 g/dL — ABNORMAL LOW (ref 12.0–15.0)
Immature Granulocytes: 2 %
Lymphocytes Relative: 24 %
Lymphs Abs: 3.1 K/uL (ref 0.7–4.0)
MCH: 28.3 pg (ref 26.0–34.0)
MCHC: 32.8 g/dL (ref 30.0–36.0)
MCV: 86.4 fL (ref 80.0–100.0)
Monocytes Absolute: 1 K/uL (ref 0.1–1.0)
Monocytes Relative: 8 %
Neutro Abs: 8.3 K/uL — ABNORMAL HIGH (ref 1.7–7.7)
Neutrophils Relative %: 65 %
Platelets: 289 K/uL (ref 150–400)
RBC: 2.72 MIL/uL — ABNORMAL LOW (ref 3.87–5.11)
RDW: 14.1 % (ref 11.5–15.5)
WBC: 12.8 K/uL — ABNORMAL HIGH (ref 4.0–10.5)
nRBC: 0 % (ref 0.0–0.2)

## 2024-08-03 LAB — GLUCOSE, CAPILLARY: Glucose-Capillary: 100 mg/dL — ABNORMAL HIGH (ref 70–99)

## 2024-08-03 MED ORDER — GABAPENTIN 100 MG PO CAPS: 100.0000 mg | ORAL_CAPSULE | Freq: Every day | ORAL | Status: AC

## 2024-08-03 MED ORDER — CEFADROXIL 500 MG PO CAPS: 1000.0000 mg | ORAL_CAPSULE | Freq: Two times a day (BID) | ORAL | Status: AC

## 2024-08-03 MED ORDER — DICLOFENAC SODIUM 1 % EX GEL: 2.0000 g | Freq: Four times a day (QID) | CUTANEOUS | Status: AC

## 2024-08-03 MED ORDER — ALBUTEROL SULFATE (2.5 MG/3ML) 0.083% IN NEBU: 2.5000 mg | INHALATION_SOLUTION | RESPIRATORY_TRACT | Status: AC | PRN

## 2024-08-03 NOTE — Plan of Care (Signed)

## 2024-08-03 NOTE — TOC Transition Note (Signed)
 Transition of Care [facility]) - Discharge Note   Patient Details  Name: Debra [name] MRN: [id number] Date of Birth: [birth year]  [facility]) CM/SW Contact:  Heather DELENA Saltness, LCSW Phone Number: 08/03/2024, 11:04 AM   Clinical Narrative:    Pt discharging to [facility] today for short-term SNF rehab. Pt assigned to room 202. D/C packet placed in pt's chart at RN station. RN to call report to [phone]. Pt and sister, [name], will pick pt up for transport at 12 PM. Pt and sister made aware and in agreement with discharge plan. No further TOC needs at this time.   Final next level of care: Skilled Nursing Facility Barriers to Discharge: Barriers Resolved   Patient Goals and CMS Choice Patient states their goals for this hospitalization and ongoing recovery are:: To go to [facility] CMS Medicare.gov Compare Post Acute Care list provided to:: Patient   [place] ownership interest in [facility].provided to:: Patient    Discharge Placement              Patient chooses bed at: [facility] Patient to be transferred to facility by: Pt's sister Name of family member notified: [name] Patient and family notified of of transfer: 08/03/24  Discharge Plan and Services Additional resources added to the After Visit Summary for   In-house Referral: Clinical Social Work              DME Arranged: N/A DME Agency: NA       HH Arranged: NA HH Agency: NA        Social Drivers of Health (SDOH) Interventions SDOH Screenings   Food Insecurity: No Food Insecurity (07/26/2024)  Housing: Low Risk  (07/26/2024)  Transportation Needs: No Transportation Needs (07/27/2024)  Utilities: Not At Risk (07/26/2024)  Alcohol Screen: Low Risk  (01/06/2024)  Depression (PHQ2-9): High Risk (03/17/2024)  Financial Resource Strain: Low Risk  (01/06/2024)  Physical Activity: Inactive (01/06/2024)  Social Connections: Moderately Isolated (07/26/2024)   Stress: Stress Concern Present (01/06/2024)  Tobacco Use: High Risk (07/26/2024)  Health Literacy: Adequate Health Literacy (01/06/2024)     Readmission Risk Interventions     No data to display           Signed: Heather Saltness, MSW, LCSW Clinical Social Worker Inpatient Care Management 08/03/2024 11:04 AM

## 2024-08-03 NOTE — Progress Notes (Signed)
 11:23 Called facility and gave report to [place], LPN, all questions answered. Pt is alert and oriented, not in any distress. Peripheral IVs removed. Pt discharged to SNF with all of her belongings accompanied by her sister.

## 2024-08-03 NOTE — Plan of Care (Signed)
  Problem: Clinical Measurements: Goal: Ability to maintain clinical measurements within normal limits will improve Outcome: Progressing   Problem: Activity: Goal: Risk for activity intolerance will decrease Outcome: Progressing   Problem: Nutrition: Goal: Adequate nutrition will be maintained Outcome: Progressing   Problem: Coping: Goal: Level of anxiety will decrease Outcome: Progressing   Problem: Pain Managment: Goal: General experience of comfort will improve and/or be controlled Outcome: Progressing   Problem: Safety: Goal: Ability to remain free from injury will improve Outcome: Progressing

## 2024-08-03 NOTE — Discharge Summary (Signed)
 Physician Discharge Summary  Debra [name] FMW:[id number] DOB: December 08, 1955 DOA: 07/26/2024  PCP: Antonio Cyndee Jamee JONELLE, DO  Admit date: 07/26/2024 Discharge date: 08/03/2024  Admitted From:  Discharge disposition: SNF   Recommendations for Outpatient Follow-Up:   Antibiotics through 10/9 Monitor for resolution of UTI   Discharge Diagnosis:   Principal Problem:   Pyelonephritis    Discharge Condition: Improved.  Diet recommendation: Low sodium, heart healthy.  Carbohydrate-modified.  Regular.  Wound care: None.  Code status: Full.   History of Present Illness:   Debra [name] is a 68 y.o. female with past medical history significant for chronic diastolic congestive heart failure, HTN, DMT2, major depressive disorder, generalized anxiety disorder, hypothyroidism, OSA, cognitive impairment who presented to [facility] ED on 07/26/2024 with generalized pain, nausea/vomiting, lethargy and near syncope.   In the ED, temperature 101.5 F rectally, HR 103, RR 22, BP 91/61, SpO2 94% on room air.  WBC 19.3, hemoglobin 10.8, platelet count 177.  Sodium 134, potassium 3.4, chloride 97, CO2 22, glucose 153, BUN 24, creat 1.36.  AST 28, ALT 16, total bilirubin 1.1.  High-sensitivity troponin less than 15.  Lactic acid 1.5.  COVID/influenza/RSV PCR negative.  Urinalysis with moderate leukocytes, positive nitrite, many bacteria, greater than 50 WBCs.  Current plan for skilled nursing facility.     Hospital Course by Problem:   Acute metabolic encephalopathy, POA: Improving Patient presenting with confusion, lethargy in the setting of fever, elevated WBC count, urinalysis consistent with urinary tract infection and abdominal imaging consistent with right-sided pyelonephritis.  CT head without contrast with no acute intracranial abnormality, noted chronic cerebral white matter disease stable from MRI prior year, extensive chronic suboccipital cervical spine surgery that is  grossly stable since 2007. -- Supportive care, treatment as below -Delirium precautions   Severe sepsis, POA--E. coli bacteremia Urinary tract infection Right pyelonephritis Patient presenting to ED with confusion, generalized pain associate with nausea and vomiting.  Found to be febrile with temperature 101.5 F rectally, tachycardic, tachypneic with elevated WBC count of 19.3 and urinalysis consistent with urinary tract infection.  CT abdomen/pelvis with contrast with abnormal right kidney and ureter consistent with a standing urinary tract infection/acute pyelonephritis, no other acute or inflammatory process within the abdomen/pelvis. -- WBCs trending down -- Blood culture: E. coli -- Urine culture: E. coli -- Ceftriaxone  changed to oral antibiotics as white blood cell count has normalized    Hypokalemia -Replete   Chronic diastolic congestive heart failure, compensated HTN -Resume home meds as able   HLD -- Pravastatin  40 mg p.o. daily   DM2 Currently diet controlled at baseline.  6.5 on 06/22/2024, well-controlled. -- CBGs qAC/HS   Hypothyroidism -- Levothyroxine  50 mcg p.o. daily   Generalized anxiety disorder Major depressive disorder -- Paxil  40 mg p.o. daily   Cognitive impairment Follows with neurology outpatient.  On donepezil . --Continue donepezil  5 mg p.o. daily --Delirium precautions   OSA -- Nocturnal CPAP   Weakness/debility/deconditioning: -- PT/OT evaluation-SNF placement   Hypokalemia - Repleted   Of note, patient does not take Neurontin  TID at home per niece so have adjusted dose to nightly      Medical Consultants:      Discharge Exam:   Vitals:   08/02/24 2018 08/03/24 0410  BP: 139/66 (!) 149/94  Pulse: 74 79  Resp: 17 16  Temp: 98.5 F (36.9 C) (!) 97.5 F (36.4 C)  SpO2: 97% 95%   Vitals:   08/02/24 1001  08/02/24 1257 08/02/24 2018 08/03/24 0410  BP: (!) 150/84 135/66 139/66 (!) 149/94  Pulse: 69 (!) 58 74 79  Resp:   14 17 16   Temp:  98.8 F (37.1 C) 98.5 F (36.9 C) (!) 97.5 F (36.4 C)  TempSrc:  Oral Oral Oral  SpO2:  97% 97% 95%  Weight:      Height:        General exam: Appears calm and comfortable.    The results of significant diagnostics from this hospitalization (including imaging, microbiology, ancillary and laboratory) are listed below for reference.     Procedures and Diagnostic Studies:   CT ABDOMEN PELVIS W CONTRAST Result Date: 07/26/2024 CLINICAL DATA:  68 year old female with altered mental status, lethargy, pain. EXAM: CT ABDOMEN AND PELVIS WITH CONTRAST TECHNIQUE: Multidetector CT imaging of the abdomen and pelvis was performed using the standard protocol following bolus administration of intravenous contrast. RADIATION DOSE REDUCTION: This exam was performed according to the departmental dose-optimization program which includes automated exposure control, adjustment of the mA and/or kV according to patient size and/or use of iterative reconstruction technique. CONTRAST:  80mL OMNIPAQUE  IOHEXOL  300 MG/ML  SOLN COMPARISON:  Abdomen ultrasound 09/03/2016. FINDINGS: Lower chest: Mild, symmetric dependent opacity at both lung bases most resembles atelectasis. No pleural effusion. No pericardial effusion. Borderline to mild cardiomegaly. Hepatobiliary: Negative liver and gallbladder. Pancreas: Negative. Spleen: Negative. Adrenals/Urinary Tract: Normal adrenal glands. Left kidney and left ureter appear normal. But abnormal right renal enhancement, the right kidney appears inflamed, indistinct. Right pararenal space confluent edema or inflammation (series 2, image 38). Delayed and/or striated nephrogram on that side. Urothelial thickening of the proximal right ureter. No right intrarenal calculus. Right ureter is highly indistinct, inflamed throughout its course (series 7, image 53) but does not seem abnormally dilated. And multiple soft tissue calcifications at the level of the proximal right  ureter (series 2, images 40-42) are favored to be gonadal vessel phleboliths. Multiple pelvic phleboliths also. No calculus within the bladder. Distal right ureter seems decompressed. Stomach/Bowel: Redundant large bowel, intermittently gas distended. Mild to moderate volume of retained stool also. No abnormally dilated large bowel loops. Normal appendix in the right lower quadrant series 7, image 50. No large bowel inflammation identified. Nondilated small bowel. Small fat containing umbilical hernia (series 2, image 40) with no inflammation. Decompressed stomach. No pneumoperitoneum. No free fluid. Vascular/Lymphatic: Fairly advanced Aortoiliac calcified atherosclerosis. Major arterial structures remain patent in the abdomen and pelvis despite atherosclerosis. Portal venous system is patent. No lymphadenopathy. Reproductive: Retroverted uterus with densely calcified fibroid measuring up to 8 cm diameter (sagittal image 113, series 2, image 68). Otherwise negative. Other: No pelvis free fluid. Musculoskeletal: Advanced lower thoracic endplate degeneration. Advanced lumbar facet degeneration. No acute or suspicious osseous lesion. IMPRESSION: 1. Abnormal Right kidney and ureter, constellation of findings which favor Ascending Urinary Infection and Acute Pyelonephritis, rather than obstructive uropathy (although the latter difficult to completely exclude - suspect multiple right gonadal vein phleboliths are superimposed along the course of the right ureter). Correlation with urinalysis may be helpful. 2. No other acute or inflammatory process identified in the abdomen or pelvis. Normal appendix. 3.  Aortic Atherosclerosis (ICD10-I70.0). Electronically Signed   By: VEAR Hurst M.D.   On: 07/26/2024 10:45   CT Head Wo Contrast Result Date: 07/26/2024 CLINICAL DATA:  68 year old female with altered mental status, lethargy, pain. EXAM: CT HEAD WITHOUT CONTRAST TECHNIQUE: Contiguous axial images were obtained from the base  of the skull through the vertex  without intravenous contrast. RADIATION DOSE REDUCTION: This exam was performed according to the departmental dose-optimization program which includes automated exposure control, adjustment of the mA and/or kV according to patient size and/or use of iterative reconstruction technique. COMPARISON:  Brain MRI 05/06/2023.  Head CT 08/15/2006. FINDINGS: Brain: Cerebral volume loss since the 2007 CT seems age-appropriate. No midline shift, ventriculomegaly, mass effect, evidence of mass lesion, intracranial hemorrhage or evidence of cortically based acute infarction. Patchy chronic white matter disease appears stable from the MRI last year. Maintained gray-white differentiation and no cortical encephalomalacia identified. Vascular: No suspicious intracranial vascular hyperdensity. Calcified atherosclerosis at the skull base. Skull: Partially visible extensive chronic suboccipital cervical posterior element surgery and fusion. Visible portion stable from 2007 head CT. No acute osseous abnormality identified. Sinuses/Orbits: Visualized paranasal sinuses and mastoids are stable and well aerated. Other: Postoperative changes to the left globe. No acute orbit or scalp soft tissue finding. IMPRESSION: 1. No acute intracranial abnormality. 2. Chronic cerebral white matter disease appears stable from MRI last year. 3. Extensive chronic suboccipital cervical spine surgery, that visible grossly stable since 2007. Electronically Signed   By: VEAR Hurst M.D.   On: 07/26/2024 10:20   DG Chest Port 1 View Result Date: 07/26/2024 CLINICAL DATA:  Chest pain EXAM: PORTABLE CHEST 1 VIEW COMPARISON:  05/25/2019 FINDINGS: The lungs are clear without focal pneumonia, edema, pneumothorax or pleural effusion. The cardiopericardial silhouette is within normal limits for size. No acute bony abnormality. Telemetry leads overlie the chest. IMPRESSION: No active disease. Electronically Signed   By: Camellia Candle M.D.    On: 07/26/2024 08:20     Labs:   Basic Metabolic Panel: Recent Labs  Lab 07/28/24 0416 07/29/24 0356 07/30/24 0354 08/01/24 0408  NA 137 137 140 138  K 4.0 3.6 3.3* 3.7  CL 106 106 108 105  CO2 21* 22 22 22   GLUCOSE 103* 121* 112* 91  BUN 21 16 12 8   CREATININE 0.92 0.79 0.71 0.75  CALCIUM  8.7* 8.4* 8.5* 8.5*  MG 2.3  --   --   --   PHOS 1.4*  --   --   --    GFR Estimated Creatinine Clearance: 60.9 mL/min (by C-G formula based on SCr of 0.75 mg/dL). Liver Function Tests: No results for input(s): AST, ALT, ALKPHOS, BILITOT, PROT, ALBUMIN in the last 168 hours. No results for input(s): LIPASE, AMYLASE in the last 168 hours. No results for input(s): AMMONIA in the last 168 hours. Coagulation profile No results for input(s): INR, PROTIME in the last 168 hours.  CBC: Recent Labs  Lab 07/28/24 0416 07/29/24 0356 08/01/24 0408 08/02/24 0321 08/03/24 0315  WBC 19.1* 10.5 13.5* 14.0* 12.8*  NEUTROABS  --   --   --   --  8.3*  HGB 9.2* 8.2* 8.0* 7.9* 7.7*  HCT 29.4* 25.8* 24.7* 23.3* 23.5*  MCV 87.5 86.3 85.8 83.2 86.4  PLT 158 156 221 252 289   Cardiac Enzymes: No results for input(s): CKTOTAL, CKMB, CKMBINDEX, TROPONINI in the last 168 hours. BNP: Invalid input(s): POCBNP CBG: Recent Labs  Lab 08/02/24 0745 08/02/24 1146 08/02/24 1616 08/02/24 2203 08/03/24 0731  GLUCAP 101* 141* 120* 137* 100*   D-Dimer No results for input(s): DDIMER in the last 72 hours. Hgb A1c No results for input(s): HGBA1C in the last 72 hours. Lipid Profile No results for input(s): CHOL, HDL, LDLCALC, TRIG, CHOLHDL, LDLDIRECT in the last 72 hours. Thyroid  function studies No results for input(s): TSH, T4TOTAL, T3FREE,  THYROIDAB in the last 72 hours.  Invalid input(s): FREET3 Anemia work up No results for input(s): VITAMINB12, FOLATE, FERRITIN, TIBC, IRON, RETICCTPCT in the last 72  hours. Microbiology Recent Results (from the past 240 hours)  Resp panel by RT-PCR (RSV, Flu A&B, Covid) Anterior Nasal Swab     Status: None   Collection Time: 07/26/24  7:51 AM   Specimen: Anterior Nasal Swab  Result Value Ref Range Status   SARS Coronavirus 2 by RT PCR NEGATIVE NEGATIVE Final    Comment: (NOTE) SARS-CoV-2 target nucleic acids are NOT DETECTED.  The SARS-CoV-2 RNA is generally detectable in upper respiratory specimens during the acute phase of infection. The lowest concentration of SARS-CoV-2 viral copies this assay can detect is 138 copies/mL. A negative result does not preclude SARS-Cov-2 infection and should not be used as the sole basis for treatment or other patient management decisions. A negative result may occur with  improper specimen collection/handling, submission of specimen other than nasopharyngeal swab, presence of viral mutation(s) within the areas targeted by this assay, and inadequate number of viral copies(<138 copies/mL). A negative result must be combined with clinical observations, patient history, and epidemiological information. The expected result is Negative.  Fact Sheet for Patients:  BloggerCourse.com  Fact Sheet for Healthcare Providers:  SeriousBroker.it  This test is no t yet approved or cleared by the [place]  FDA and  has been authorized for detection and/or diagnosis of SARS-CoV-2 by FDA under an Emergency Use Authorization (EUA). This EUA will remain  in effect (meaning this test can be used) for the duration of the COVID-19 declaration under Section 564(b)(1) of the Act, 21 U.S.C.section 360bbb-3(b)(1), unless the authorization is terminated  or revoked sooner.       Influenza A by PCR NEGATIVE NEGATIVE Final   Influenza B by PCR NEGATIVE NEGATIVE Final    Comment: (NOTE) The Xpert Xpress SARS-CoV-2/FLU/RSV plus assay is intended as an aid in the diagnosis of  influenza from Nasopharyngeal swab specimens and should not be used as a sole basis for treatment. Nasal washings and aspirates are unacceptable for Xpert Xpress SARS-CoV-2/FLU/RSV testing.  Fact Sheet for Patients: BloggerCourse.com  Fact Sheet for Healthcare Providers: SeriousBroker.it  This test is not yet approved or cleared by the [place]  FDA and has been authorized for detection and/or diagnosis of SARS-CoV-2 by FDA under an Emergency Use Authorization (EUA). This EUA will remain in effect (meaning this test can be used) for the duration of the COVID-19 declaration under Section 564(b)(1) of the Act, 21 U.S.C. section 360bbb-3(b)(1), unless the authorization is terminated or revoked.     Resp Syncytial Virus by PCR NEGATIVE NEGATIVE Final    Comment: (NOTE) Fact Sheet for Patients: BloggerCourse.com  Fact Sheet for Healthcare Providers: SeriousBroker.it  This test is not yet approved or cleared by the [place]  FDA and has been authorized for detection and/or diagnosis of SARS-CoV-2 by FDA under an Emergency Use Authorization (EUA). This EUA will remain in effect (meaning this test can be used) for the duration of the COVID-19 declaration under Section 564(b)(1) of the Act, 21 U.S.C. section 360bbb-3(b)(1), unless the authorization is terminated or revoked.  Performed at [facility], [address] [place], [place] [address]   Culture, blood (single)     Status: Abnormal   Collection Time: 07/26/24  9:39 AM   Specimen: BLOOD RIGHT FOREARM  Result Value Ref Range Status   Specimen Description BLOOD RIGHT FOREARM  Final   Special  Requests   Final    BOTTLES DRAWN AEROBIC AND ANAEROBIC Blood Culture adequate volume   Culture  Setup Time   Final    GRAM NEGATIVE RODS IN BOTH AEROBIC AND ANAEROBIC BOTTLES CRITICAL RESULT CALLED TO, READ BACK BY AND  VERIFIED WITH: PHARMD M. BELL [id number] AT 0027, ADC Performed at [facility], [address] [place], [place] [address]    Culture ESCHERICHIA COLI (A)  Final   Report Status 07/28/2024 FINAL  Final   Organism ID, Bacteria ESCHERICHIA COLI  Final      Susceptibility   Escherichia coli - MIC*    AMPICILLIN <=2 SENSITIVE Sensitive     CEFAZOLIN (NON-URINE) <=1 SENSITIVE Sensitive     CEFEPIME <=0.12 SENSITIVE Sensitive     ERTAPENEM <=0.12 SENSITIVE Sensitive     CEFTRIAXONE <=0.25 SENSITIVE Sensitive     CIPROFLOXACIN  <=0.06 SENSITIVE Sensitive     GENTAMICIN <=1 SENSITIVE Sensitive     MEROPENEM <=0.25 SENSITIVE Sensitive     TRIMETH /SULFA  <=20 SENSITIVE Sensitive     AMPICILLIN/SULBACTAM <=2 SENSITIVE Sensitive     PIP/TAZO Value in next row Sensitive      <=4 SENSITIVEThis is a modified FDA-approved test that has been validated and its performance characteristics determined by the reporting laboratory.  This laboratory is certified under the Clinical Laboratory Improvement Amendments CLIA as qualified to perform high complexity clinical laboratory testing.    * ESCHERICHIA COLI  Urine Culture     Status: Abnormal   Collection Time: 07/26/24  9:39 AM   Specimen: Urine, Random  Result Value Ref Range Status   Specimen Description   Final    URINE, RANDOM Performed at [facility], [address] [place], [place] [address]    Special Requests   Final    NONE Reflexed from [id number] Performed at [facility], [address] [place], [place] [address]    Culture (A)  Final    40,000 COLONIES/mL ESCHERICHIA COLI Two isolates with different morphologies were identified as the same organism.The most resistant organism was reported. Performed at [facility], [address], [place], [place] [address]    Report Status 07/28/2024 FINAL  Final   Organism ID, Bacteria ESCHERICHIA COLI (A)  Final      Susceptibility   Escherichia coli - MIC*     AMPICILLIN RESISTANT Resistant     CEFAZOLIN (URINE) Value in next row Resistant      >=32 RESISTANTThis is a modified FDA-approved test that has been validated and its performance characteristics determined by the reporting laboratory.  This laboratory is certified under the Clinical Laboratory Improvement Amendments CLIA as qualified to perform high complexity clinical laboratory testing.    CEFEPIME Value in next row Sensitive      >=32 RESISTANTThis is a modified FDA-approved test that has been validated and its performance characteristics determined by the reporting laboratory.  This laboratory is certified under the Clinical Laboratory Improvement Amendments CLIA as qualified to perform high complexity clinical laboratory testing.    ERTAPENEM Value in next row Sensitive      >=32 RESISTANTThis is a modified FDA-approved test that has been validated and its performance characteristics determined by the reporting laboratory.  This laboratory is certified under the Clinical Laboratory Improvement Amendments CLIA as qualified to perform high complexity clinical laboratory testing.    CEFTRIAXONE Value in next row Sensitive      >=32 RESISTANTThis is a modified FDA-approved test  that has been validated and its performance characteristics determined by the reporting laboratory.  This laboratory is certified under the Clinical Laboratory Improvement Amendments CLIA as qualified to perform high complexity clinical laboratory testing.    CIPROFLOXACIN  Value in next row Sensitive      >=32 RESISTANTThis is a modified FDA-approved test that has been validated and its performance characteristics determined by the reporting laboratory.  This laboratory is certified under the Clinical Laboratory Improvement Amendments CLIA as qualified to perform high complexity clinical laboratory testing.    GENTAMICIN Value in next row Sensitive      >=32 RESISTANTThis is a modified FDA-approved test that has been validated  and its performance characteristics determined by the reporting laboratory.  This laboratory is certified under the Clinical Laboratory Improvement Amendments CLIA as qualified to perform high complexity clinical laboratory testing.    NITROFURANTOIN Value in next row Sensitive      >=32 RESISTANTThis is a modified FDA-approved test that has been validated and its performance characteristics determined by the reporting laboratory.  This laboratory is certified under the Clinical Laboratory Improvement Amendments CLIA as qualified to perform high complexity clinical laboratory testing.    TRIMETH /SULFA  Value in next row Sensitive      >=32 RESISTANTThis is a modified FDA-approved test that has been validated and its performance characteristics determined by the reporting laboratory.  This laboratory is certified under the Clinical Laboratory Improvement Amendments CLIA as qualified to perform high complexity clinical laboratory testing.    AMPICILLIN/SULBACTAM Value in next row Sensitive      >=32 RESISTANTThis is a modified FDA-approved test that has been validated and its performance characteristics determined by the reporting laboratory.  This laboratory is certified under the Clinical Laboratory Improvement Amendments CLIA as qualified to perform high complexity clinical laboratory testing.    PIP/TAZO Value in next row Sensitive      <=4 SENSITIVEThis is a modified FDA-approved test that has been validated and its performance characteristics determined by the reporting laboratory.  This laboratory is certified under the Clinical Laboratory Improvement Amendments CLIA as qualified to perform high complexity clinical laboratory testing.    MEROPENEM Value in next row Sensitive      <=4 SENSITIVEThis is a modified FDA-approved test that has been validated and its performance characteristics determined by the reporting laboratory.  This laboratory is certified under the Clinical Laboratory Improvement  Amendments CLIA as qualified to perform high complexity clinical laboratory testing.    * 40,000 COLONIES/mL ESCHERICHIA COLI  Blood Culture ID Panel (Reflexed)     Status: Abnormal   Collection Time: 07/26/24  9:39 AM  Result Value Ref Range Status   Enterococcus faecalis NOT DETECTED NOT DETECTED Final   Enterococcus Faecium NOT DETECTED NOT DETECTED Final   Listeria monocytogenes NOT DETECTED NOT DETECTED Final   Staphylococcus species NOT DETECTED NOT DETECTED Final   Staphylococcus aureus (BCID) NOT DETECTED NOT DETECTED Final   Staphylococcus epidermidis NOT DETECTED NOT DETECTED Final   Staphylococcus lugdunensis NOT DETECTED NOT DETECTED Final   Streptococcus species NOT DETECTED NOT DETECTED Final   Streptococcus agalactiae NOT DETECTED NOT DETECTED Final   Streptococcus pneumoniae NOT DETECTED NOT DETECTED Final   Streptococcus pyogenes NOT DETECTED NOT DETECTED Final   A.calcoaceticus-baumannii NOT DETECTED NOT DETECTED Final   Bacteroides fragilis NOT DETECTED NOT DETECTED Final   Enterobacterales DETECTED (A) NOT DETECTED Final    Comment: Enterobacterales represent a large order of gram negative bacteria, not a single organism. CRITICAL RESULT CALLED  TO, READ BACK BY AND VERIFIED WITH: PHARMD M. BELL [id number] AT 0027, ADC    Enterobacter cloacae complex NOT DETECTED NOT DETECTED Final   Escherichia coli DETECTED (A) NOT DETECTED Final    Comment: CRITICAL RESULT CALLED TO, READ BACK BY AND VERIFIED WITH: PHARMD M. BELL [id number] AT 0027, ADC    Klebsiella aerogenes NOT DETECTED NOT DETECTED Final   Klebsiella oxytoca NOT DETECTED NOT DETECTED Final   Klebsiella pneumoniae NOT DETECTED NOT DETECTED Final   Proteus species NOT DETECTED NOT DETECTED Final   Salmonella species NOT DETECTED NOT DETECTED Final   Serratia marcescens NOT DETECTED NOT DETECTED Final   Haemophilus influenzae NOT DETECTED NOT DETECTED Final   Neisseria meningitidis NOT DETECTED NOT DETECTED Final    Pseudomonas aeruginosa NOT DETECTED NOT DETECTED Final   Stenotrophomonas maltophilia NOT DETECTED NOT DETECTED Final   Candida albicans NOT DETECTED NOT DETECTED Final   Candida auris NOT DETECTED NOT DETECTED Final   Candida glabrata NOT DETECTED NOT DETECTED Final   Candida krusei NOT DETECTED NOT DETECTED Final   Candida parapsilosis NOT DETECTED NOT DETECTED Final   Candida tropicalis NOT DETECTED NOT DETECTED Final   Cryptococcus neoformans/gattii NOT DETECTED NOT DETECTED Final   CTX-M ESBL NOT DETECTED NOT DETECTED Final   Carbapenem resistance IMP NOT DETECTED NOT DETECTED Final   Carbapenem resistance KPC NOT DETECTED NOT DETECTED Final   Carbapenem resistance NDM NOT DETECTED NOT DETECTED Final   Carbapenem resist OXA 48 LIKE NOT DETECTED NOT DETECTED Final   Carbapenem resistance VIM NOT DETECTED NOT DETECTED Final    Comment: Performed at [facility], [address] [place], [place] [address]     Discharge Instructions:   Discharge Instructions     Diet general   Complete by: As directed    Increase activity slowly   Complete by: As directed       Allergies as of 08/03/2024       Reactions   Penicillins    Codeine Rash, Other (See Comments)   dizziness        Medication List     STOP taking these medications    gabapentin  600 MG tablet Commonly known as: NEURONTIN  Replaced by: gabapentin  100 MG capsule   hydrochlorothiazide  25 MG tablet Commonly known as: HYDRODIURIL    predniSONE  10 MG tablet Commonly known as: DELTASONE        TAKE these medications    Accu-Chek Softclix Lancets lancets USE TO CHECK BLOOD GLUCOSE ONCE A DAY   albuterol (2.5 MG/3ML) 0.083% nebulizer solution Commonly known as: PROVENTIL Take 3 mLs (2.5 mg total) by nebulization every 2 (two) hours as needed for wheezing.   ammonium lactate  12 % lotion Commonly known as: LAC-HYDRIN  Apply 1 Application topically as needed for dry skin.   azelastine  0.1 % nasal  spray Commonly known as: ASTELIN  Place 2 sprays into both nostrils 2 (two) times daily. Use in each nostril as directed   Blood Glucose Monitoring Suppl Devi 1 each by Does not apply route in the morning, at noon, and at bedtime. May substitute to any manufacturer covered by patient's insurance.   cefadroxil 500 MG capsule Commonly known as: DURICEF Take 2 capsules (1,000 mg total) by mouth 2 (two) times daily.   diclofenac Sodium 1 % Gel Commonly known as: VOLTAREN Apply 2 g topically 4 (four) times daily.   donepezil  5 MG tablet Commonly known as: ARICEPT  Take 1 tablet (5 mg total) by mouth daily.  fluticasone  50 MCG/ACT nasal spray Commonly known as: FLONASE  Place 2 sprays into both nostrils daily.   gabapentin  100 MG capsule Commonly known as: NEURONTIN  Take 1 capsule (100 mg total) by mouth at bedtime. Replaces: gabapentin  600 MG tablet   levothyroxine  50 MCG tablet Commonly known as: SYNTHROID  Take 1 tablet (50 mcg total) by mouth daily.   losartan  100 MG tablet Commonly known as: COZAAR  Take 1 tablet (100 mg total) by mouth daily.   metoprolol  succinate 100 MG 24 hr tablet Commonly known as: TOPROL -XL Take 1 tablet (100 mg total) by mouth daily. Take with or immediately following a meal.   PARoxetine  40 MG tablet Commonly known as: PAXIL  Take 1 tablet (40 mg total) by mouth every morning.   pravastatin  40 MG tablet Commonly known as: PRAVACHOL  Take 1 tablet (40 mg total) by mouth daily.   saccharomyces boulardii 250 MG capsule Commonly known as: Florastor Take 1 capsule (250 mg total) by mouth 2 (two) times daily.   traZODone  50 MG tablet Commonly known as: DESYREL  Take 0.5-1 tablets (25-50 mg total) by mouth at bedtime as needed for sleep.   trospium  20 MG tablet Commonly known as: SANCTURA  Take 1 tablet (20 mg total) by mouth 2 (two) times daily.        Contact information for after-discharge care     Destination     [facility] .   Service: Skilled Nursing Contact information: [address] [place]  [address] [phone]                      Time coordinating discharge: 45 min  Signed:  Harlene RAYMOND Bowl DO  [facility] 08/03/2024, 9:27 AM

## 2024-08-04 ENCOUNTER — Other Ambulatory Visit (HOSPITAL_COMMUNITY): Payer: Self-pay

## 2024-08-04 DIAGNOSIS — R7881 Bacteremia: Secondary | ICD-10-CM | POA: Diagnosis not present

## 2024-08-04 DIAGNOSIS — G9341 Metabolic encephalopathy: Secondary | ICD-10-CM | POA: Diagnosis not present

## 2024-08-04 DIAGNOSIS — I10 Essential (primary) hypertension: Secondary | ICD-10-CM | POA: Diagnosis not present

## 2024-08-04 DIAGNOSIS — R6520 Severe sepsis without septic shock: Secondary | ICD-10-CM | POA: Diagnosis not present

## 2024-08-04 DIAGNOSIS — N390 Urinary tract infection, site not specified: Secondary | ICD-10-CM | POA: Diagnosis not present

## 2024-08-06 DIAGNOSIS — R2681 Unsteadiness on feet: Secondary | ICD-10-CM | POA: Diagnosis not present

## 2024-08-06 DIAGNOSIS — M6281 Muscle weakness (generalized): Secondary | ICD-10-CM | POA: Diagnosis not present

## 2024-08-06 DIAGNOSIS — R6520 Severe sepsis without septic shock: Secondary | ICD-10-CM | POA: Diagnosis not present

## 2024-08-06 DIAGNOSIS — G9341 Metabolic encephalopathy: Secondary | ICD-10-CM | POA: Diagnosis not present

## 2024-08-06 DIAGNOSIS — N12 Tubulo-interstitial nephritis, not specified as acute or chronic: Secondary | ICD-10-CM | POA: Diagnosis not present

## 2024-08-06 DIAGNOSIS — I10 Essential (primary) hypertension: Secondary | ICD-10-CM | POA: Diagnosis not present

## 2024-08-09 ENCOUNTER — Ambulatory Visit: Admitting: Obstetrics and Gynecology

## 2024-08-09 ENCOUNTER — Telehealth: Payer: Self-pay

## 2024-08-09 NOTE — Telephone Encounter (Signed)
 Copied from CRM [id number]. Topic: Clinical - Medical Advice >> Aug 09, 2024 12:41 PM Rea ORN wrote: Reason for CRM: Pt called to request call back from PCP regarding recent health concerns. Pt didn't elaborate but said she should know. I assume it is related to the pt's recent hospital stay from 9/29-10/7. She said she said she was surprised that she hadnt heard from PCP given what she had just been through. She is requesting a call back today at [phone]

## 2024-08-10 ENCOUNTER — Telehealth: Payer: Self-pay

## 2024-08-10 NOTE — Transitions of Care (Post Inpatient/ED Visit) (Unsigned)
   08/10/2024  Name: Debra [name] MRN: [id number] DOB: 09/10/1956  Today's TOC FU Call Status: Today's TOC FU Call Status:: Unsuccessful Call (1st Attempt) Unsuccessful Call (1st Attempt) Date: 08/10/24  Attempted to reach the patient regarding the most recent Inpatient/ED visit.  Follow Up Plan: Additional outreach attempts will be made to reach the patient to complete the Transitions of Care (Post Inpatient/ED visit) call.   Signature Julian Lemmings, LPN [facility] Nurse Health Advisor Direct Dial [phone]

## 2024-08-10 NOTE — Telephone Encounter (Signed)
 I called and was unable to leave a voicemail so I have sent a mychart message to schedule

## 2024-08-10 NOTE — Telephone Encounter (Signed)
 Pt needs hosp follow up please per [facility]

## 2024-08-11 NOTE — Transitions of Care (Post Inpatient/ED Visit) (Signed)
 08/11/2024  Name: Debra [name] MRN: [id number] DOB: 08/31/1956  Today's TOC FU Call Status: Today's TOC FU Call Status:: Successful TOC FU Call Completed Unsuccessful Call (1st Attempt) Date: 08/10/24 [facility] FU Call Complete Date: 08/11/24 Patient's Name and Date of Birth confirmed.  Transition Care Management Follow-up Telephone Call Date of Discharge: 08/09/24 Discharge Facility: Other [facility]) Name of Other (Non[facility]) Discharge Facility: [facility] Type of Discharge: Inpatient Admission Primary Inpatient Discharge Diagnosis:: pyelonephritis How have you been since you were released from the hospital?: Better Any questions or concerns?: No  Items Reviewed: Did you receive and understand the discharge instructions provided?: Yes Medications obtained,verified, and reconciled?: Yes (Medications Reviewed) Any new allergies since your discharge?: No Dietary orders reviewed?: Yes Do you have support at home?: No  Medications Reviewed Today: Medications Reviewed Today     Reviewed by Emmitt Pan, LPN (Licensed Practical Nurse) on 08/11/24 at 1044  Med List Status: <None>   Medication Order Taking? Sig Documenting Provider Last Dose Status Informant  Accu-Chek Softclix Lancets lancets [id number] Yes USE TO CHECK BLOOD GLUCOSE ONCE A DAY Antonio Meth, Yvonne R, DO  Active Self  albuterol (PROVENTIL) (2.5 MG/3ML) 0.083% nebulizer solution [id number] Yes Take 3 mLs (2.5 mg total) by nebulization every 2 (two) hours as needed for wheezing. Vann, Jessica U, DO  Active   ammonium lactate  (LAC-HYDRIN ) 12 % lotion [id number] Yes Apply 1 Application topically as needed for dry skin. Lowne Chase, Yvonne R, DO  Active Self  azelastine  (ASTELIN ) 0.1 % nasal spray [id number] Yes Place 2 sprays into both nostrils 2 (two) times daily. Use in each nostril as directed Lowne Chase, Yvonne R, DO  Active Self  Blood Glucose Monitoring Suppl DEVI [id number] Yes 1 each by Does not apply route  in the morning, at noon, and at bedtime. May substitute to any manufacturer covered by patient's insurance. Lowne Chase, Yvonne R, DO  Active Self  cefadroxil (DURICEF) 500 MG capsule [id number] Yes Take 2 capsules (1,000 mg total) by mouth 2 (two) times daily. Vann, Jessica U, DO  Active   diclofenac Sodium (VOLTAREN) 1 % GEL [id number] Yes Apply 2 g topically 4 (four) times daily. Vann, Jessica U, DO  Active   donepezil  (ARICEPT ) 5 MG tablet [id number] Yes Take 1 tablet (5 mg total) by mouth daily. Antonio Meth Jamee JONELLE, DO  Active Self           Med Note (S[place], TEENA BRAVO   Mon Jul 26, 2024  7:20 PM) Patient verified she is still taking this medication   fluticasone  (FLONASE ) 50 MCG/ACT nasal spray [id number] Yes Place 2 sprays into both nostrils daily. Lowne Chase, Yvonne R, DO  Active Self  gabapentin  (NEURONTIN ) 100 MG capsule [id number] Yes Take 1 capsule (100 mg total) by mouth at bedtime. Vann, Jessica U, DO  Active   levothyroxine  (SYNTHROID ) 50 MCG tablet [id number] Yes Take 1 tablet (50 mcg total) by mouth daily. Lowne Chase, Yvonne R, DO  Active Self  losartan  (COZAAR ) 100 MG tablet [id number] Yes Take 1 tablet (100 mg total) by mouth daily. Lowne Chase, Yvonne R, DO  Active Self  metoprolol  succinate (TOPROL -XL) 100 MG 24 hr tablet [id number] Yes Take 1 tablet (100 mg total) by mouth daily. Take with or immediately following a meal. Antonio Meth, Yvonne R, DO  Active Self  PARoxetine  (PAXIL ) 40 MG tablet [id number] Yes Take 1 tablet (40 mg total) by mouth every morning. Lowne Chase, Yvonne R, DO  Active Self  pravastatin  (PRAVACHOL ) 40 MG tablet [id number] Yes Take 1 tablet (40 mg total) by mouth daily. Antonio Meth, Jamee SAUNDERS, DO  Active Self  saccharomyces boulardii (FLORASTOR) 250 MG capsule [id number] Yes Take 1 capsule (250 mg total) by mouth 2 (two) times daily. Lowne Chase, Yvonne R, DO  Active Self  traZODone  (DESYREL ) 50 MG tablet [id number] Yes Take 0.5-1 tablets (25-50 mg total) by mouth  at bedtime as needed for sleep. Lowne Chase, Yvonne R, DO  Active Self  trospium  (SANCTURA ) 20 MG tablet [id number] Yes Take 1 tablet (20 mg total) by mouth 2 (two) times daily. Marilynne Rosaline SAILOR, MD  Active Self            Home Care and Equipment/Supplies: Were Home Health Services Ordered?: Yes Name of Home Health Agency:: unknown Has Agency set up a time to come to your home?: No Any new equipment or medical supplies ordered?: NA  Functional Questionnaire: Do you need assistance with bathing/showering or dressing?: No Do you need assistance with meal preparation?: No Do you need assistance with eating?: No Do you have difficulty maintaining continence: No Do you need assistance with getting out of bed/getting out of a chair/moving?: No Do you have difficulty managing or taking your medications?: No  Follow up appointments reviewed: PCP Follow-up appointment confirmed?: Yes Date of PCP follow-up appointment?: 08/13/24 Follow-up Provider: [facility] Follow-up appointment confirmed?: NA Do you need transportation to your follow-up appointment?: No Do you understand care options if your condition(s) worsen?: Yes-patient verbalized understanding    SIGNATURE Julian Lemmings, LPN [facility] Nurse Health Advisor Direct Dial [phone]

## 2024-08-13 ENCOUNTER — Encounter: Payer: Self-pay | Admitting: Family Medicine

## 2024-08-13 ENCOUNTER — Ambulatory Visit: Admitting: Family Medicine

## 2024-08-13 VITALS — BP 100/70 | HR 97 | Temp 97.8°F | Resp 18 | Ht 62.0 in | Wt 148.4 lb

## 2024-08-13 DIAGNOSIS — E039 Hypothyroidism, unspecified: Secondary | ICD-10-CM | POA: Diagnosis not present

## 2024-08-13 DIAGNOSIS — E1165 Type 2 diabetes mellitus with hyperglycemia: Secondary | ICD-10-CM

## 2024-08-13 DIAGNOSIS — E7800 Pure hypercholesterolemia, unspecified: Secondary | ICD-10-CM | POA: Diagnosis not present

## 2024-08-13 DIAGNOSIS — E1169 Type 2 diabetes mellitus with other specified complication: Secondary | ICD-10-CM

## 2024-08-13 DIAGNOSIS — I10 Essential (primary) hypertension: Secondary | ICD-10-CM

## 2024-08-13 DIAGNOSIS — E785 Hyperlipidemia, unspecified: Secondary | ICD-10-CM | POA: Diagnosis not present

## 2024-08-13 DIAGNOSIS — N12 Tubulo-interstitial nephritis, not specified as acute or chronic: Secondary | ICD-10-CM | POA: Diagnosis not present

## 2024-08-13 NOTE — Progress Notes (Unsigned)
 Subjective:    Patient ID: Debra [name], female    DOB: [birth year], 68 y.o.   MRN: [id number]  Chief Complaint  Patient presents with   Hospitalization Follow-up    HPI Patient is in today for f/u pyelonephritis   Discussed the use of AI scribe software for clinical note transcription with the patient, who gave verbal consent to proceed.  History of Present Illness Debra [name] is a 68 year old female who presents with persistent fatigue following a recent hospitalization for a severe kidney infection.  She has been experiencing extreme fatigue and a sense of being 'depleted of all vitamins' since her discharge from a skilled nursing facility earlier this week. She was hospitalized on September 29th for a kidney infection; she recalls being told by hospital staff that if she had waited one more day, she would not have survived. During her hospital stay, she was treated with antibiotics.  Following her discharge, she was transferred to a skilled nursing facility for rehabilitation. However, she was dissatisfied with the physical therapy provided, describing it as inadequate and not vigorous enough to help her regain her strength. She decided to leave the facility, believing she could perform similar exercises at home.  She has not been monitoring her blood sugar levels regularly due to the chaotic environment at the skilled nursing facility and misplaced equipment. She wants to improve her energy levels by walking in the park and engaging in other activities.  She has not been taking any vitamins recently as her previous supplements expired. She mentions taking calcium  supplements in the past but has not resumed them.  She receives food deliveries from a service associated with Medicare, which she refers to as 'mom's meals'. She finds the process of coordinating these services tiring, as evidenced by a recent two-hour phone call with Medicare.  No current pain, but she reports  previous back pain and generalized body pain associated with the kidney infection. She confirms receiving a flu shot recently.      Past Medical History:  Diagnosis Date   Abdominal pain, right upper quadrant 04/30/2007   Allergies 02/03/2019   Anemia, iron deficiency 04/17/2017   Aortic atherosclerosis 10/10/2017   Atypical chest pain 08/31/2014   Back pain with radiculopathy 01/25/2010   Check xray --- ? Cause of leg weakness   Chronic pain of both shoulders 10/31/2020   Cognitive deficits 2022   Constipation    DDD (degenerative disc disease)    Diastolic dysfunction 02/15/2010   Epilepsy    Childhood seziures; pt reports that she grew out of them and they have not occurred since childhood   Essential hypertension 08/31/2010   Poorly controlled will alter medications, encouraged DASH diet, minimize caffeine and obtain adequate sleep. Report concerning symptoms and follow up as directed and as needed [employer] metoprolol  Edema may be c   Expressive language impairment 04/13/2024   Fatigue 01/25/2010   Generalized anxiety disorder 04/22/2018   Stable co'nt meds   GERD (gastroesophageal reflux disease) 07/16/2007   Hearing loss 07/24/2012   Refer to hearing clinic   Hiatal hernia 07/16/2007   Hyperglycemia, fasting 05/04/2010   Hyperlipidemia 04/17/2017   Encouraged heart healthy diet, increase exercise, avoid trans fats, consider a krill oil cap daily   Hypothyroidism 11/02/2010   con't meds; Lab Results Component Value TSH 1.44 08/12/2017   Insomnia 04/30/2007   Knee pain 07/16/2007   Leg pain, bilateral 11/17/2008   Low back pain 07/16/2007  Major depressive disorder 01/30/2018   con't meds F/u counselor   Mild cognitive impairment of uncertain or unknown etiology 01/30/2021   Mixed connective tissue disease    with Raynaud's   Obstructive sleep apnea 01/30/2018   F/u with pulm; May be cause of some of her memory/concentration problems   Osteoarthritis     Osteoporosis    Palpitations 02/12/2008   Pansinusitis 01/30/2018   Peripheral vertigo 04/30/2007   Resolved rto prn   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   Plantar wart 05/06/2008   Restless legs syndrome 12/01/2008   Stomach ulcer    Swallowing difficulty    Type 2 diabetes mellitus with hyperglycemia, without long-term current use of insulin  08/09/2022   Upper respiratory tract infection 12/14/2019   Urge incontinence of urine 10/06/2020   Vaginal discharge 05/25/2021   Vitamin D  deficiency    Wound infection 10/04/2021   Wound of right ankle 09/27/2021    Past Surgical History:  Procedure Laterality Date   CARPAL TUNNEL RELEASE     Bilateral   CERVICAL SPINE SURGERY     x3   CESAREAN SECTION     x3   LEG SURGERY     Right    WRIST FRACTURE SURGERY  10/02/2015   Pt have surgery twice on the same wrist.    Family History  Problem Relation Age of Onset   Breast cancer Mother        possibly 23's   Arthritis Mother        rheumatoid   Cancer Mother        breast   Heart disease Mother 26       MI   Hyperlipidemia Mother    Thyroid  disease Mother    Depression Mother    Anxiety disorder Mother    Heart disease Father        MI   Hypertension Father    Stroke Father    Diabetes Sister    Hypertension Sister    Hyperlipidemia Sister    Memory loss Maternal Aunt    Colon cancer Maternal Grandfather    Esophageal cancer Neg Hx    Rectal cancer Neg Hx    Stomach cancer Neg Hx     Social History   Socioeconomic History   Marital status: Legally Separated    Spouse name: Not on file   Number of children: 3   Years of education: 14   Highest education level: Some college, no degree  Occupational History   Occupation: Security  Tobacco Use   Smoking status: Every Day    Current packs/day: 0.75    Average packs/day: 0.8 packs/day for 44.0 years (33.0 ttl pk-yrs)    Types: Cigarettes   Smokeless tobacco: Never  Substance and Sexual Activity   Alcohol use:  Yes    Comment: 2-3 drinks per week (beer/wine)   Drug use: Yes    Types: Methylphenidate   Sexual activity: Yes    Partners: Male    Birth control/protection: Post-menopausal  Other Topics Concern   Not on file  Social History Narrative   Lives alone   Currently working   Drinks caffeine   Two floor home   ambidextrous   Social Drivers of Health   Financial Resource Strain: Low Risk  (01/06/2024)   Overall Financial Resource Strain (CARDIA)    Difficulty of Paying Living Expenses: Not very hard  Food Insecurity: No Food Insecurity (07/26/2024)   Hunger Vital Sign    Worried About Running Out  of Food in the Last Year: Never true    Ran Out of Food in the Last Year: Never true  Transportation Needs: No Transportation Needs (07/27/2024)   PRAPARE - Administrator, Civil Service (Medical): No    Lack of Transportation (Non-Medical): No  Physical Activity: Inactive (01/06/2024)   Exercise Vital Sign    Days of Exercise per Week: 0 days    Minutes of Exercise per Session: 0 min  Stress: Stress Concern Present (01/06/2024)   [employer] of Occupational Health - Occupational Stress Questionnaire    Feeling of Stress : Very much  Social Connections: Moderately Isolated (07/26/2024)   Social Connection and Isolation Panel    Frequency of Communication with Friends and Family: More than three times a week    Frequency of Social Gatherings with Friends and Family: Once a week    Attends Religious Services: More than 4 times per year    Active Member of [employer] or Organizations: No    Attends Banker Meetings: Never    Marital Status: Separated  Intimate Partner Violence: Not At Risk (07/26/2024)   Humiliation, Afraid, Rape, and Kick questionnaire    Fear of Current or Ex-Partner: No    Emotionally Abused: No    Physically Abused: No    Sexually Abused: No    Outpatient Medications Prior to Visit  Medication Sig Dispense Refill   Accu-Chek Softclix  Lancets lancets USE TO CHECK BLOOD GLUCOSE ONCE A DAY 100 each 12   albuterol (PROVENTIL) (2.5 MG/3ML) 0.083% nebulizer solution Take 3 mLs (2.5 mg total) by nebulization every 2 (two) hours as needed for wheezing.     ammonium lactate  (LAC-HYDRIN ) 12 % lotion Apply 1 Application topically as needed for dry skin. 400 g 0   azelastine  (ASTELIN ) 0.1 % nasal spray Place 2 sprays into both nostrils 2 (two) times daily. Use in each nostril as directed 30 mL 5   Blood Glucose Monitoring Suppl DEVI 1 each by Does not apply route in the morning, at noon, and at bedtime. May substitute to any manufacturer covered by patient's insurance. 1 each 0   cefadroxil (DURICEF) 500 MG capsule Take 2 capsules (1,000 mg total) by mouth 2 (two) times daily.     diclofenac Sodium (VOLTAREN) 1 % GEL Apply 2 g topically 4 (four) times daily.     donepezil  (ARICEPT ) 5 MG tablet Take 1 tablet (5 mg total) by mouth daily. 30 tablet 11   fluticasone  (FLONASE ) 50 MCG/ACT nasal spray Place 2 sprays into both nostrils daily. 16 g 6   gabapentin  (NEURONTIN ) 100 MG capsule Take 1 capsule (100 mg total) by mouth at bedtime.     levothyroxine  (SYNTHROID ) 50 MCG tablet Take 1 tablet (50 mcg total) by mouth daily. 100 tablet 2   losartan  (COZAAR ) 100 MG tablet Take 1 tablet (100 mg total) by mouth daily. 100 tablet 2   metoprolol  succinate (TOPROL -XL) 100 MG 24 hr tablet Take 1 tablet (100 mg total) by mouth daily. Take with or immediately following a meal. 90 tablet 1   PARoxetine  (PAXIL ) 40 MG tablet Take 1 tablet (40 mg total) by mouth every morning. 90 tablet 3   pravastatin  (PRAVACHOL ) 40 MG tablet Take 1 tablet (40 mg total) by mouth daily. 90 tablet 1   saccharomyces boulardii (FLORASTOR) 250 MG capsule Take 1 capsule (250 mg total) by mouth 2 (two) times daily. 60 capsule 5   traZODone  (DESYREL ) 50 MG tablet Take 0.5-1  tablets (25-50 mg total) by mouth at bedtime as needed for sleep. 30 tablet 3   trospium  (SANCTURA ) 20 MG  tablet Take 1 tablet (20 mg total) by mouth 2 (two) times daily. 60 tablet 5   No facility-administered medications prior to visit.    Allergies  Allergen Reactions   Penicillins    Codeine Rash and Other (See Comments)    dizziness    Review of Systems  Constitutional:  Positive for malaise/fatigue. Negative for fever.  HENT:  Negative for congestion.   Eyes:  Negative for blurred vision.  Respiratory:  Negative for shortness of breath.   Cardiovascular:  Negative for chest pain, palpitations and leg swelling.  Gastrointestinal:  Negative for abdominal pain, blood in stool and nausea.  Genitourinary:  Negative for dysuria and frequency.  Musculoskeletal:  Negative for falls.  Skin:  Negative for rash.  Neurological:  Negative for dizziness, loss of consciousness and headaches.  Endo/Heme/Allergies:  Negative for environmental allergies.  Psychiatric/Behavioral:  Negative for depression. The patient is not nervous/anxious.        Objective:    Physical Exam Vitals and nursing note reviewed.  Constitutional:      General: She is not in acute distress.    Appearance: Normal appearance. She is well-developed.  HENT:     Head: Normocephalic and atraumatic.  Eyes:     General: No scleral icterus.       Right eye: No discharge.        Left eye: No discharge.  Cardiovascular:     Rate and Rhythm: Normal rate and regular rhythm.     Heart sounds: No murmur heard. Pulmonary:     Effort: Pulmonary effort is normal. No respiratory distress.     Breath sounds: Normal breath sounds.  Musculoskeletal:        General: Normal range of motion.     Cervical back: Normal range of motion and neck supple.     Right lower leg: No edema.     Left lower leg: No edema.  Skin:    General: Skin is warm and dry.  Neurological:     Mental Status: She is alert and oriented to person, place, and time.  Psychiatric:        Mood and Affect: Mood normal.        Behavior: Behavior normal.         Thought Content: Thought content normal.        Judgment: Judgment normal.     BP 100/70 (BP Location: Left Arm, Patient Position: Sitting, Cuff Size: Normal)   Pulse 97   Temp 97.8 F (36.6 C) (Oral)   Resp 18   Ht 5' 2 (1.575 m)   Wt 148 lb 6.4 oz (67.3 kg)   SpO2 100%   BMI 27.14 kg/m  Wt Readings from Last 3 Encounters:  08/13/24 148 lb 6.4 oz (67.3 kg)  07/26/24 154 lb 8.7 oz (70.1 kg)  06/22/24 151 lb 9.6 oz (68.8 kg)    Diabetic Foot Exam - Simple   No data filed    Lab Results  Component Value Date   WBC 12.8 (H) 08/03/2024   HGB 7.7 (L) 08/03/2024   HCT 23.5 (L) 08/03/2024   PLT 289 08/03/2024   GLUCOSE 91 08/01/2024   CHOL 149 06/22/2024   TRIG 57.0 06/22/2024   HDL 54.70 06/22/2024   LDLDIRECT 126.3 06/06/2011   LDLCALC 82 06/22/2024   ALT 16 07/26/2024   AST 28 07/26/2024  NA 138 08/01/2024   K 3.7 08/01/2024   CL 105 08/01/2024   CREATININE 0.75 08/01/2024   BUN 8 08/01/2024   CO2 22 08/01/2024   TSH 1.71 06/22/2024   INR 1.0 05/25/2021   HGBA1C 6.5 06/22/2024   MICROALBUR 11.7 (H) 06/22/2024    Lab Results  Component Value Date   TSH 1.71 06/22/2024   Lab Results  Component Value Date   WBC 12.8 (H) 08/03/2024   HGB 7.7 (L) 08/03/2024   HCT 23.5 (L) 08/03/2024   MCV 86.4 08/03/2024   PLT 289 08/03/2024   Lab Results  Component Value Date   NA 138 08/01/2024   K 3.7 08/01/2024   CO2 22 08/01/2024   GLUCOSE 91 08/01/2024   BUN 8 08/01/2024   CREATININE 0.75 08/01/2024   BILITOT 1.1 07/26/2024   ALKPHOS 100 07/26/2024   AST 28 07/26/2024   ALT 16 07/26/2024   PROT 7.7 07/26/2024   ALBUMIN 3.8 07/26/2024   CALCIUM  8.5 (L) 08/01/2024   ANIONGAP 10 08/01/2024   GFR 73.24 06/22/2024   Lab Results  Component Value Date   CHOL 149 06/22/2024   Lab Results  Component Value Date   HDL 54.70 06/22/2024   Lab Results  Component Value Date   LDLCALC 82 06/22/2024   Lab Results  Component Value Date   TRIG 57.0  06/22/2024   Lab Results  Component Value Date   CHOLHDL 3 06/22/2024   Lab Results  Component Value Date   HGBA1C 6.5 06/22/2024       Assessment & Plan:  Pyelonephritis Assessment & Plan: Symptoms resolved Recheck urine   Orders: -     POCT Urinalysis Dipstick (Automated)  Primary hypertension  Hypothyroidism, unspecified type Assessment & Plan: Recheck labs   Orders: -     Thyroid  Panel With TSH  Type 2 diabetes mellitus with hyperglycemia, without long-term current use of insulin  (HCC) -     Comprehensive metabolic panel with GFR -     Hemoglobin A1c -     Microalbumin / creatinine urine ratio  Hyperlipidemia associated with type 2 diabetes mellitus (HCC) -     CBC with Differential/Platelet -     Lipid panel  Pure hypercholesterolemia Assessment & Plan: Encourage heart healthy diet such as MIND or DASH diet, increase exercise, avoid trans fats, simple carbohydrates and processed foods, consider a krill or fish or flaxseed oil cap daily.     Essential hypertension Assessment & Plan: Well controlled, no changes to meds. Encouraged heart healthy diet such as the DASH diet and exercise as tolerated.     Assessment and Plan Assessment & Plan Acute pyelonephritis   She had a severe kidney infection requiring hospitalization and antibiotics. Delaying treatment could have been life-threatening. Currently, she reports no pain, indicating resolution of the acute infection. Recheck urine to ensure infection resolution and order labs to assess current health status.  Fatigue and generalized weakness post-infection   She reports significant fatigue and a feeling of vitamin depletion following the kidney infection. Recovery can take time, so a gradual increase in activity without overexertion is advised. Check vitamin levels for deficiencies and encourage gradual physical activity as tolerated.  Type 2 diabetes mellitus   She is unsure of her current blood sugar  levels due to recent routine disruptions.  General Health Maintenance   She has received her flu shot and is interested in the COVID-19 and RSV vaccines. Information was provided on where to obtain these vaccines.  Advise obtaining COVID-19 and RSV vaccines at the pharmacy.    Ory Elting R Lowne Chase, DO

## 2024-08-13 NOTE — Assessment & Plan Note (Signed)
 Well controlled, no changes to meds. Encouraged heart healthy diet such as the DASH diet and exercise as tolerated.

## 2024-08-13 NOTE — Assessment & Plan Note (Signed)
 Symptoms resolved Recheck urine

## 2024-08-13 NOTE — Assessment & Plan Note (Signed)
 Recheck labs

## 2024-08-13 NOTE — Assessment & Plan Note (Signed)
 Encourage heart healthy diet such as MIND or DASH diet, increase exercise, avoid trans fats, simple carbohydrates and processed foods, consider a krill or fish or flaxseed oil cap daily.

## 2024-08-14 ENCOUNTER — Other Ambulatory Visit: Payer: Self-pay | Admitting: Family Medicine

## 2024-08-14 DIAGNOSIS — F411 Generalized anxiety disorder: Secondary | ICD-10-CM

## 2024-08-14 LAB — MICROALBUMIN / CREATININE URINE RATIO
Creatinine, Urine: 112 mg/dL (ref 20–275)
Microalb Creat Ratio: 113 mg/g{creat} — ABNORMAL HIGH (ref <30)
Microalb, Ur: 12.7 mg/dL

## 2024-08-14 LAB — COMPREHENSIVE METABOLIC PANEL WITH GFR
AG Ratio: 1 (calc) (ref 1.0–2.5)
ALT: 16 U/L (ref 6–29)
AST: 19 U/L (ref 10–35)
Albumin: 3.8 g/dL (ref 3.6–5.1)
Alkaline phosphatase (APISO): 135 U/L (ref 37–153)
BUN: 17 mg/dL (ref 7–25)
CO2: 26 mmol/L (ref 20–32)
Calcium: 10 mg/dL (ref 8.6–10.4)
Chloride: 105 mmol/L (ref 98–110)
Creat: 0.9 mg/dL (ref 0.50–1.05)
Globulin: 3.9 g/dL — ABNORMAL HIGH (ref 1.9–3.7)
Glucose, Bld: 92 mg/dL (ref 65–99)
Potassium: 5.1 mmol/L (ref 3.5–5.3)
Sodium: 139 mmol/L (ref 135–146)
Total Bilirubin: 0.5 mg/dL (ref 0.2–1.2)
Total Protein: 7.7 g/dL (ref 6.1–8.1)
eGFR: 69 mL/min/1.73m2 (ref >=60)

## 2024-08-14 LAB — CBC WITH DIFFERENTIAL/PLATELET
Absolute Lymphocytes: 3075 {cells}/uL (ref 850–3900)
Absolute Monocytes: 722 {cells}/uL (ref 200–950)
Basophils Absolute: 33 {cells}/uL (ref 0–200)
Basophils Relative: 0.4 %
Eosinophils Absolute: 98 {cells}/uL (ref 15–500)
Eosinophils Relative: 1.2 %
HCT: 28 % — ABNORMAL LOW (ref 35.0–45.0)
Hemoglobin: 9 g/dL — ABNORMAL LOW (ref 11.7–15.5)
MCH: 28.3 pg (ref 27.0–33.0)
MCHC: 32.1 g/dL (ref 32.0–36.0)
MCV: 88.1 fL (ref 80.0–100.0)
MPV: 9.5 fL (ref 7.5–12.5)
Monocytes Relative: 8.8 %
Neutro Abs: 4272 {cells}/uL (ref 1500–7800)
Neutrophils Relative %: 52.1 %
Platelets: 539 Thousand/uL — ABNORMAL HIGH (ref 140–400)
RBC: 3.18 Million/uL — ABNORMAL LOW (ref 3.80–5.10)
RDW: 12.8 % (ref 11.0–15.0)
Total Lymphocyte: 37.5 %
WBC: 8.2 Thousand/uL (ref 3.8–10.8)

## 2024-08-14 LAB — THYROID PANEL WITH TSH
Free Thyroxine Index: 1.3 — ABNORMAL LOW (ref 1.4–3.8)
T3 Uptake: 46 % — ABNORMAL HIGH (ref 22–35)
T4, Total: 2.8 ug/dL — ABNORMAL LOW (ref 5.1–11.9)
TSH: 1.13 m[IU]/L (ref 0.40–4.50)

## 2024-08-14 LAB — HEMOGLOBIN A1C
Hgb A1c MFr Bld: 6.1 % — ABNORMAL HIGH (ref <5.7)
Mean Plasma Glucose: 128 mg/dL
eAG (mmol/L): 7.1 mmol/L

## 2024-08-14 LAB — LIPID PANEL
Cholesterol: 156 mg/dL (ref <200)
HDL: 39 mg/dL — ABNORMAL LOW (ref >=50)
LDL Cholesterol (Calc): 97 mg/dL
Non-HDL Cholesterol (Calc): 117 mg/dL (ref <130)
Total CHOL/HDL Ratio: 4 (calc) (ref <5.0)
Triglycerides: 103 mg/dL (ref <150)

## 2024-08-19 ENCOUNTER — Other Ambulatory Visit: Payer: Self-pay | Admitting: Family Medicine

## 2024-08-20 ENCOUNTER — Other Ambulatory Visit: Payer: Self-pay | Admitting: Family Medicine

## 2024-08-20 DIAGNOSIS — G8929 Other chronic pain: Secondary | ICD-10-CM

## 2024-08-20 DIAGNOSIS — I10 Essential (primary) hypertension: Secondary | ICD-10-CM

## 2024-08-22 ENCOUNTER — Ambulatory Visit: Payer: Self-pay | Admitting: Family Medicine

## 2024-08-22 ENCOUNTER — Other Ambulatory Visit: Payer: Self-pay | Admitting: Family Medicine

## 2024-08-22 DIAGNOSIS — E039 Hypothyroidism, unspecified: Secondary | ICD-10-CM

## 2024-08-23 ENCOUNTER — Ambulatory Visit: Payer: Self-pay

## 2024-08-23 NOTE — Telephone Encounter (Signed)
 LVM to call back to triage.  Message from Alfonso ORN sent at 08/23/2024 11:53 AM EDT  Reason for Triage:  pt has pulling/stinging feeling in back and urine has been very clear not pale yellow. She is requesting callback to ensure its not an infection or something that needs to be further checked. Callback: [phone]

## 2024-08-23 NOTE — Telephone Encounter (Signed)
 FYI Only or Action Required?: FYI only for provider.  Patient was last seen in primary care on 08/13/2024 by Antonio Meth, Jamee SAUNDERS, DO.  Called Nurse Triage reporting Pain.  Symptoms began a week ago.  Interventions attempted: Nothing.  Symptoms are: gradually worsening.  Triage Disposition: See Physician Within 24 Hours  Patient/caregiver understands and will follow disposition?: Yes  Reason for Disposition  More than 2 UTI's in last year  Answer Assessment - Initial Assessment Questions Recent hospital stay from 9/29 to 10/7 for pyelonephritis. Pt calling back today about pain with urination since leaving rehab facility on Monday.  1. SEVERITY: How bad is the pain?  (e.g., Scale 1-10; mild, moderate, or severe)     Mild-moderate pain, stinging  2. FREQUENCY: How many times have you had painful urination today?      3 times  3. PATTERN: Is pain present every time you urinate or just sometimes?      All the time  4. ONSET: When did the painful urination start?      After leaving rehab center last Monday  5. FEVER: Do you have a fever? If Yes, ask: What is your temperature, how was it measured, and when did it start?     No  6. PAST UTI: Have you had a urine infection before? If Yes, ask: When was the last time? and What happened that time?      Recently hospitalized for UTI that went septic  7. CAUSE: What do you think is causing the painful urination?  (e.g., UTI, scratch, Herpes sore)     Unsure.  8. OTHER SYMPTOMS: Do you have any other symptoms? (e.g., blood in urine, flank pain, genital sores, urgency, vaginal discharge)     Clear urine.   9. PREGNANCY: Is there any chance you are pregnant? When was your last menstrual period?     No  Protocols used: Urination Pain - Female-A-AH

## 2024-08-24 ENCOUNTER — Encounter: Payer: Self-pay | Admitting: Family Medicine

## 2024-08-24 ENCOUNTER — Ambulatory Visit (INDEPENDENT_AMBULATORY_CARE_PROVIDER_SITE_OTHER): Admitting: Family Medicine

## 2024-08-24 VITALS — BP 127/68 | HR 82 | Temp 97.7°F | Ht 62.0 in | Wt 151.0 lb

## 2024-08-24 DIAGNOSIS — R300 Dysuria: Secondary | ICD-10-CM

## 2024-08-24 LAB — CBC WITH DIFFERENTIAL/PLATELET
Basophils Absolute: 0 K/uL (ref 0.0–0.1)
Basophils Relative: 0.3 % (ref 0.0–3.0)
Eosinophils Absolute: 0.1 K/uL (ref 0.0–0.7)
Eosinophils Relative: 1.9 % (ref 0.0–5.0)
HCT: 28.3 % — ABNORMAL LOW (ref 36.0–46.0)
Hemoglobin: 9.4 g/dL — ABNORMAL LOW (ref 12.0–15.0)
Lymphocytes Relative: 39.5 % (ref 12.0–46.0)
Lymphs Abs: 2.7 K/uL (ref 0.7–4.0)
MCHC: 33.3 g/dL (ref 30.0–36.0)
MCV: 84.5 fl (ref 78.0–100.0)
Monocytes Absolute: 0.7 K/uL (ref 0.1–1.0)
Monocytes Relative: 10.1 % (ref 3.0–12.0)
Neutro Abs: 3.3 K/uL (ref 1.4–7.7)
Neutrophils Relative %: 48.2 % (ref 43.0–77.0)
Platelets: 277 K/uL (ref 150.0–400.0)
RBC: 3.35 Mil/uL — ABNORMAL LOW (ref 3.87–5.11)
RDW: 13.8 % (ref 11.5–15.5)
WBC: 6.9 K/uL (ref 4.0–10.5)

## 2024-08-24 LAB — POC URINALSYSI DIPSTICK (AUTOMATED)
Bilirubin, UA: NEGATIVE
Blood, UA: NEGATIVE
Glucose, UA: NEGATIVE
Ketones, UA: NEGATIVE
Nitrite, UA: NEGATIVE
Protein, UA: NEGATIVE
Spec Grav, UA: <=1.005 — AB (ref 1.010–1.025)
Urobilinogen, UA: 0.2 U/dL
pH, UA: 7.5 (ref 5.0–8.0)

## 2024-08-24 LAB — BASIC METABOLIC PANEL WITH GFR
BUN: 16 mg/dL (ref 6–23)
CO2: 31 meq/L (ref 19–32)
Calcium: 9.7 mg/dL (ref 8.4–10.5)
Chloride: 101 meq/L (ref 96–112)
Creatinine, Ser: 0.8 mg/dL (ref 0.40–1.20)
GFR: 75.36 mL/min (ref >60.00)
Glucose, Bld: 89 mg/dL (ref 70–99)
Potassium: 4.3 meq/L (ref 3.5–5.1)
Sodium: 139 meq/L (ref 135–145)

## 2024-08-24 MED ORDER — CEFTRIAXONE SODIUM 1 G IJ SOLR
1.0000 g | Freq: Once | INTRAMUSCULAR | Status: AC
  Administered 2024-08-24: 1 g via INTRAMUSCULAR

## 2024-08-24 NOTE — Progress Notes (Signed)
 Acute Office Visit  Subjective:  Patient ID: Debra [name], female    DOB: Jul 04, 1956  Age: 68 y.o. MRN: [id number]  CC:  Chief Complaint  Patient presents with   Dysuria     HPI Debra [name] is here for pain with urination.    Discussed the use of AI scribe software for clinical note transcription with the patient, who gave verbal consent to proceed.  History of Present Illness Debra [name] is a 68 year old female who presents with a burning sensation during urination.  She has been experiencing a burning sensation during urination for a few days. She was hospitalized for pyelonephritis-sepsis a few weeks ago.   There is no odor in her urine, which is described as pale yellow to clear with no cloudiness.   She stays well hydrated. She experienced slight confusion upon returning home from rehab, attributing it to a change in environment. She is aware of her occasional confusion. She has had chills but no confirmed fever, and experienced one episode of nausea last night. No blood in her urine is reported.  She was previously hospitalized for a kidney infection with positive blood cultures for E. coli. She recalls not being sent home with antibiotics, as she left the facility against medical advice due to dissatisfaction with physical therapy. She expresses concern about the recurrence of infection, questioning if it could be due to insufficient antibiotic treatment during her hospital stay.          Past Medical History:  Diagnosis Date   Abdominal pain, right upper quadrant 04/30/2007   Allergies 02/03/2019   Anemia, iron deficiency 04/17/2017   Aortic atherosclerosis 10/10/2017   Atypical chest pain 08/31/2014   Back pain with radiculopathy 01/25/2010   Check xray --- ? Cause of leg weakness   Chronic pain of both shoulders 10/31/2020   Cognitive deficits 2022   Constipation    DDD (degenerative disc disease)    Diastolic dysfunction 02/15/2010    Epilepsy    Childhood seziures; pt reports that she grew out of them and they have not occurred since childhood   Essential hypertension 08/31/2010   Poorly controlled will alter medications, encouraged DASH diet, minimize caffeine and obtain adequate sleep. Report concerning symptoms and follow up as directed and as needed [employer] metoprolol  Edema may be c   Expressive language impairment 04/13/2024   Fatigue 01/25/2010   Generalized anxiety disorder 04/22/2018   Stable co'nt meds   GERD (gastroesophageal reflux disease) 07/16/2007   Hearing loss 07/24/2012   Refer to hearing clinic   Hiatal hernia 07/16/2007   Hyperglycemia, fasting 05/04/2010   Hyperlipidemia 04/17/2017   Encouraged heart healthy diet, increase exercise, avoid trans fats, consider a krill oil cap daily   Hypothyroidism 11/02/2010   con't meds; Lab Results Component Value TSH 1.44 08/12/2017   Insomnia 04/30/2007   Knee pain 07/16/2007   Leg pain, bilateral 11/17/2008   Low back pain 07/16/2007   Major depressive disorder 01/30/2018   con't meds F/u counselor   Mild cognitive impairment of uncertain or unknown etiology 01/30/2021   Mixed connective tissue disease    with Raynaud's   Obstructive sleep apnea 01/30/2018   F/u with pulm; May be cause of some of her memory/concentration problems   Osteoarthritis    Osteoporosis    Palpitations 02/12/2008   Pansinusitis 01/30/2018   Peripheral vertigo 04/30/2007   Resolved rto prn   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   Plantar wart 05/06/2008  Restless legs syndrome 12/01/2008   Stomach ulcer    Swallowing difficulty    Type 2 diabetes mellitus with hyperglycemia, without long-term current use of insulin  08/09/2022   Upper respiratory tract infection 12/14/2019   Urge incontinence of urine 10/06/2020   Vaginal discharge 05/25/2021   Vitamin D  deficiency    Wound infection 10/04/2021   Wound of right ankle 09/27/2021    Past Surgical History:   Procedure Laterality Date   CARPAL TUNNEL RELEASE     Bilateral   CERVICAL SPINE SURGERY     x3   CESAREAN SECTION     x3   LEG SURGERY     Right    WRIST FRACTURE SURGERY  10/02/2015   Pt have surgery twice on the same wrist.    Family History  Problem Relation Age of Onset   Breast cancer Mother        possibly 71's   Arthritis Mother        rheumatoid   Cancer Mother        breast   Heart disease Mother 71       MI   Hyperlipidemia Mother    Thyroid  disease Mother    Depression Mother    Anxiety disorder Mother    Heart disease Father        MI   Hypertension Father    Stroke Father    Diabetes Sister    Hypertension Sister    Hyperlipidemia Sister    Memory loss Maternal Aunt    Colon cancer Maternal Grandfather    Esophageal cancer Neg Hx    Rectal cancer Neg Hx    Stomach cancer Neg Hx     Social History   Socioeconomic History   Marital status: Legally Separated    Spouse name: Not on file   Number of children: 3   Years of education: 14   Highest education level: Some college, no degree  Occupational History   Occupation: Security  Tobacco Use   Smoking status: Every Day    Current packs/day: 0.75    Average packs/day: 0.8 packs/day for 44.0 years (33.0 ttl pk-yrs)    Types: Cigarettes   Smokeless tobacco: Never  Substance and Sexual Activity   Alcohol use: Yes    Comment: 2-3 drinks per week (beer/wine)   Drug use: Yes    Types: Methylphenidate   Sexual activity: Yes    Partners: Male    Birth control/protection: Post-menopausal  Other Topics Concern   Not on file  Social History Narrative   Lives alone   Currently working   Drinks caffeine   Two floor home   ambidextrous   Social Drivers of Health   Financial Resource Strain: Low Risk  (01/06/2024)   Overall Financial Resource Strain (CARDIA)    Difficulty of Paying Living Expenses: Not very hard  Food Insecurity: No Food Insecurity (07/26/2024)   Hunger Vital Sign    Worried  About Running Out of Food in the Last Year: Never true    Ran Out of Food in the Last Year: Never true  Transportation Needs: No Transportation Needs (07/27/2024)   PRAPARE - Administrator, Civil Service (Medical): No    Lack of Transportation (Non-Medical): No  Physical Activity: Inactive (01/06/2024)   Exercise Vital Sign    Days of Exercise per Week: 0 days    Minutes of Exercise per Session: 0 min  Stress: Stress Concern Present (01/06/2024)   [employer] of  Occupational Health - Occupational Stress Questionnaire    Feeling of Stress : Very much  Social Connections: Moderately Isolated (07/26/2024)   Social Connection and Isolation Panel    Frequency of Communication with Friends and Family: More than three times a week    Frequency of Social Gatherings with Friends and Family: Once a week    Attends Religious Services: More than 4 times per year    Active Member of [employer] or Organizations: No    Attends Banker Meetings: Never    Marital Status: Separated  Intimate Partner Violence: Not At Risk (07/26/2024)   Humiliation, Afraid, Rape, and Kick questionnaire    Fear of Current or Ex-Partner: No    Emotionally Abused: No    Physically Abused: No    Sexually Abused: No    ROS All ROS negative except what is listed in the HPI.   Objective:   Today's Vitals: BP 127/68   Pulse 82   Temp 97.7 F (36.5 C) (Oral)   Ht 5' 2 (1.575 m)   Wt 151 lb (68.5 kg)   BMI 27.62 kg/m   Physical Exam Vitals reviewed.  Constitutional:      Appearance: Normal appearance.  Cardiovascular:     Rate and Rhythm: Normal rate and regular rhythm.     Heart sounds: Normal heart sounds.  Pulmonary:     Effort: Pulmonary effort is normal.     Breath sounds: Normal breath sounds.  Abdominal:     Tenderness: There is no abdominal tenderness. There is no right CVA tenderness, left CVA tenderness, guarding or rebound.  Skin:    General: Skin is warm and dry.   Neurological:     Mental Status: She is alert and oriented to person, place, and time.  Psychiatric:        Mood and Affect: Mood normal.        Behavior: Behavior normal.        Thought Content: Thought content normal.        Judgment: Judgment normal.          Assessment & Plan:   Problem List Items Addressed This Visit   None Visit Diagnoses       Dysuria    -  Primary UA with large leuks. Will give Rocephin injection today - She tolerated during recent hospitalization. Urine sent for culture. Adjust treatment pending results.  Stay hydrated.  Monitor for worsening symptoms. Go to the ED for severe symptoms.  Will update CBC/BMP given recent severe infection to ensure stable.    Relevant Medications   cefTRIAXone (ROCEPHIN) injection 1 g (Completed)   Other Relevant Orders   POCT Urinalysis Dipstick (Automated) (Completed)   Urine Culture   Basic metabolic panel with GFR   CBC with Differential/Platelet                 Follow-up: Return if symptoms worsen or fail to improve.   Waddell FURY Almarie, DNP, FNP-C  I,Emily Lagle,acting as a neurosurgeon for Waddell KATHEE Almarie, NP.,have documented all relevant documentation on the behalf of Waddell KATHEE Almarie, NP,as directed by  Waddell KATHEE Almarie, NP while in the presence of Waddell KATHEE Almarie, NP.   I, Waddell KATHEE Almarie, NP, have reviewed all documentation for this visit. The documentation on 08/24/2024 for the exam, diagnosis, procedures, and orders are all accurate and complete.

## 2024-08-25 LAB — URINE CULTURE
MICRO NUMBER:: 17157371
SPECIMEN QUALITY:: ADEQUATE

## 2024-08-26 ENCOUNTER — Telehealth: Payer: Self-pay | Admitting: Pharmacist

## 2024-08-26 ENCOUNTER — Telehealth: Payer: Self-pay

## 2024-08-26 ENCOUNTER — Encounter: Payer: Self-pay | Admitting: Pharmacist

## 2024-08-26 ENCOUNTER — Ambulatory Visit: Payer: Self-pay | Admitting: Family Medicine

## 2024-08-26 NOTE — Telephone Encounter (Signed)
 Copied from CRM #[id number]. Topic: Clinical - Medical Advice >> Aug 26, 2024  9:58 AM Anairis L wrote: Reason for CRM: Returning Debra [name], [place] phone call.

## 2024-08-26 NOTE — Progress Notes (Addendum)
 Pharmacy Quality Measure Review  This patient is appearing on a report for being at risk of failing the adherence measure for cholesterol (statin) medications this calendar year.   Medication: pravastatin   Last fill date: 04/18/2024 for 90 day supply  Reviewed recent refill history in Dr Annemarie database. Actual last refill date was 08/14/2024 for 90 day supply. [facility] 08/15/24). Patient has 1 refills remaining. Next appointment with PCP is not currently scheduled but last visit was 08/13/2024.    Medication: Ozempic  Last fill date: 06/19/2024 for 28 day supply  Ozempic  1mg  is no longer on her medication list - removed September 2025 - noted patient reported she was no longer taking. Patient requested refill 08/22/2024 but it was denied.   Called patient today. She states Ozempic  was not given while she was in rehab and taken off her med list. She denies any adverse effects from Ozempic  - no abdominal pain or GI side effects.  She reports that blood glucose has been up and down since she stopped Ozempic  but last A1c was at goal  Lab Results  Component Value Date   HGBA1C 6.1 (H) 08/13/2024    Wt Readings from Last 3 Encounters:  08/24/24 151 lb (68.5 kg)  08/13/24 148 lb 6.4 oz (67.3 kg)  07/26/24 154 lb 8.7 oz (70.1 kg)     Insurance report was not up to date. No action needed at this time regarding pravastatin  Reached out to PCP regarding restarting Ozempic . If restarted recommend start with either 0.25 or 0.5mg  weekly since patient has been off for about 1 months.   Madelin Ray, PharmD Clinical Pharmacist [facility]  Population Health [phone]

## 2024-08-26 NOTE — Progress Notes (Unsigned)
 Patient is requesting refill for Ozempic  1mg  weekly but it was removed from her medication list in September 2025 but patient states this was because she had not been able to get Ozempic  after she left rehab. She tried to refill but her pharmacy reported they has not refills left.  She denies any history of abdominal pain or GI upset while taking Ozempic . Denies history of pancreatitis. I was not able to find a medical reason for discontinuation. Patient reports blood glucose is up and down.   Last A1c was 6.1%. Last Ozempic  refill was 06/19/2024 so her last dose was likely around the end of September.   Lab Results  Component Value Date   HGBA1C 6.1 (H) 08/13/2024   Will check with PCP about restarting Ozempic  - would recommend restarting at 0.25mg  or 0.5mg  weekly if OK to restart. Then after 1 month can increase dose as tolerated and needed.   Madelin Ray, PharmD Clinical Pharmacist [facility]   08/27/2024 - Addendum Dr AntonioGLENWOOD Meth OK'd restarting Ozempic . Will start with 0.5mg  weekly for 4 weeks, then increase to 1mg  weekly.  Patient is aware that prescription was sent to [facility].   Madelin Ray, PharmD Clinical Pharmacist [facility]

## 2024-08-26 NOTE — Telephone Encounter (Signed)
Pt called and labs reviewed 

## 2024-08-27 ENCOUNTER — Ambulatory Visit: Payer: Self-pay

## 2024-08-27 MED ORDER — SEMAGLUTIDE (1 MG/DOSE) 4 MG/3ML ~~LOC~~ SOPN: 1.0000 mg | PEN_INJECTOR | SUBCUTANEOUS | 1 refills | Status: DC

## 2024-08-27 MED ORDER — OZEMPIC (0.25 OR 0.5 MG/DOSE) 2 MG/3ML ~~LOC~~ SOPN: 0.5000 mg | PEN_INJECTOR | SUBCUTANEOUS | 0 refills | Status: DC

## 2024-08-27 NOTE — Telephone Encounter (Signed)
 FYI Only or Action Required?: FYI only for provider: education given.  Patient was last seen in primary care on 08/24/2024 by Almarie Waddell NOVAK, NP.  Called Nurse Triage reporting Dysuria.  Symptoms began today.  Interventions attempted: Nothing.  Symptoms are: unchanged.  Triage Disposition: See Physician Within 24 Hours  Patient/caregiver understands and will follow disposition?: Yes     Copied from CRM [id number]. Topic: Clinical - Red Word Triage >> Aug 27, 2024  4:57 PM Debra [name] wrote: Red Word that prompted transfer to Nurse Triage: Patient stated she has been experiencing some things after her appointment when the nurse gave a shot Reason for Disposition  All other patients with painful urination  (Exception: [1] EITHER frequency or urgency AND [2] has on-call doctor.)  Answer Assessment - Initial Assessment Questions Pt states she was just recently seen for a UTI. Pt was concerned because today she began to have that stinging feeling again. She states not while she was peeing like usual but  afterwards. She wanted to know if anything she was doing was causing something bad to happen with the antibiotics, or anything she was doing to continue to cause these. Rn reviewed best hygiene practices to prevent UTIs, drink plenty of liquids, cranberry juice, keeping the area as dry and clean as possible. Also discussed if these continue there could be other factors to look into. Pt felt relieved after speaking with RN and was grateful for time and education.     1. SEVERITY: How bad is the pain?  (e.[name]., Scale 1-10; mild, moderate, or severe)     mild 2. FREQUENCY: How many times have you had painful urination today?      Once today 3. PATTERN: Is pain present every time you urinate or just sometimes?      once 4. ONSET: When did the painful urination start?      Earlier today 5. FEVER: Do you have a fever? If Yes, ask: What is your temperature, how was it measured, and  when did it start?     no 6. PAST UTI: Have you had a urine infection before? If Yes, ask: When was the last time? and What happened that time?      Just had rocephin shot for one  Protocols used: Urination Pain - Female-A-AH

## 2024-09-02 ENCOUNTER — Telehealth: Payer: Self-pay | Admitting: *Deleted

## 2024-09-02 NOTE — Telephone Encounter (Signed)
 Copied from CRM #[id number]. Topic: General - Other >> Sep 02, 2024  3:16 PM Dedra B wrote: Reason for CRM: Pt is requesting a call from Dr. Antonio Peacock nurse. She would not disclose what the call was regarding and just said she needed some information.

## 2024-09-03 NOTE — Telephone Encounter (Signed)
 Pt called in to follow up on call from yesterday. 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 Please call pt to advise or for more information. She also states she doesn't think she can print from [place] so she may need to pick something up from the front desk.

## 2024-09-07 ENCOUNTER — Ambulatory Visit: Attending: Physician Assistant | Admitting: Speech Pathology

## 2024-09-07 DIAGNOSIS — R41841 Cognitive communication deficit: Secondary | ICD-10-CM | POA: Insufficient documentation

## 2024-09-08 ENCOUNTER — Ambulatory Visit: Admitting: Speech Pathology

## 2024-09-08 ENCOUNTER — Encounter: Payer: Self-pay | Admitting: Speech Pathology

## 2024-09-08 DIAGNOSIS — R41841 Cognitive communication deficit: Secondary | ICD-10-CM | POA: Diagnosis present

## 2024-09-08 NOTE — Patient Instructions (Addendum)
   [employer] has a interior and spatial designer in the foyer of [employer] - bins under a counter for [employer] the house before bed and make sure lights and stove is off  Be mindful to keep your keys in the same place - it's easy to put them in your pockets without realizing it - keep them hooked to your purse

## 2024-09-08 NOTE — Therapy (Signed)
 OUTPATIENT SPEECH LANGUAGE PATHOLOGY APHASIA TREATMENT   Patient Name: Debra [name] MRN: [id number] DOB:Apr 23, 1956, 68 y.o., female Today's Date: 09/08/2024  PCP: Antonio Cyndee Jamee JONELLE, DO REFERRING PROVIDER: Dina Camie BRAVO, PA-C  END OF SESSION:  End of Session - 09/08/24 1412     Visit Number 2    Number of Visits 25    Date for Recertification  10/05/24    SLP Start Time 1410   arrived late   SLP Stop Time  1445    SLP Time Calculation (min) 35 min    Activity Tolerance Patient tolerated treatment well          Past Medical History:  Diagnosis Date   Abdominal pain, right upper quadrant 04/30/2007   Allergies 02/03/2019   Anemia, iron deficiency 04/17/2017   Aortic atherosclerosis 10/10/2017   Atypical chest pain 08/31/2014   Back pain with radiculopathy 01/25/2010   Check xray --- ? Cause of leg weakness   Chronic pain of both shoulders 10/31/2020   Cognitive deficits 2022   Constipation    DDD (degenerative disc disease)    Diastolic dysfunction 02/15/2010   Epilepsy    Childhood seziures; pt reports that she grew out of them and they have not occurred since childhood   Essential hypertension 08/31/2010   Poorly controlled will alter medications, encouraged DASH diet, minimize caffeine and obtain adequate sleep. Report concerning symptoms and follow up as directed and as needed [employer] metoprolol  Edema may be c   Expressive language impairment 04/13/2024   Fatigue 01/25/2010   Generalized anxiety disorder 04/22/2018   Stable co'nt meds   GERD (gastroesophageal reflux disease) 07/16/2007   Hearing loss 07/24/2012   Refer to hearing clinic   Hiatal hernia 07/16/2007   Hyperglycemia, fasting 05/04/2010   Hyperlipidemia 04/17/2017   Encouraged heart healthy diet, increase exercise, avoid trans fats, consider a krill oil cap daily   Hypothyroidism 11/02/2010   con't meds; Lab Results Component Value TSH 1.44 08/12/2017   Insomnia 04/30/2007   Knee  pain 07/16/2007   Leg pain, bilateral 11/17/2008   Low back pain 07/16/2007   Major depressive disorder 01/30/2018   con't meds F/u counselor   Mild cognitive impairment of uncertain or unknown etiology 01/30/2021   Mixed connective tissue disease    with Raynaud's   Obstructive sleep apnea 01/30/2018   F/u with pulm; May be cause of some of her memory/concentration problems   Osteoarthritis    Osteoporosis    Palpitations 02/12/2008   Pansinusitis 01/30/2018   Peripheral vertigo 04/30/2007   Resolved rto prn   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   Plantar wart 05/06/2008   Restless legs syndrome 12/01/2008   Stomach ulcer    Swallowing difficulty    Type 2 diabetes mellitus with hyperglycemia, without long-term current use of insulin  08/09/2022   Upper respiratory tract infection 12/14/2019   Urge incontinence of urine 10/06/2020   Vaginal discharge 05/25/2021   Vitamin D  deficiency    Wound infection 10/04/2021   Wound of right ankle 09/27/2021   Past Surgical History:  Procedure Laterality Date   CARPAL TUNNEL RELEASE     Bilateral   CERVICAL SPINE SURGERY     x3   CESAREAN SECTION     x3   LEG SURGERY     Right    WRIST FRACTURE SURGERY  10/02/2015   Pt have surgery twice on the same wrist.   Patient Active Problem List   Diagnosis Date Noted  Pyelonephritis 07/26/2024   Overactive bladder 05/28/2024   Chronic idiopathic constipation 05/28/2024   Sacrodynia 04/15/2024   Pelvic mass 04/15/2024   Expressive language impairment 04/13/2024   Type 2 diabetes mellitus with hyperglycemia, without long-term current use of insulin  08/09/2022   Snapping lateral band due to ligament laxity 02/19/2022   Polyuria 01/01/2022   SUI (stress urinary incontinence, female) 01/01/2022   Trigger thumb, right thumb 09/13/2021   Pruritic dermatitis 05/25/2021   Balance disorder 05/25/2021   Dizziness 05/25/2021   Mild cognitive impairment of uncertain or unknown etiology  01/30/2021   Osteoarthritis    Vitamin D  deficiency    Urinary frequency 10/31/2020   Chronic pain of both shoulders 10/31/2020   Female pattern baldness 10/06/2020   Myalgia 10/06/2020   Urge incontinence of urine 10/06/2020   Allergies 02/03/2019   Acute vaginitis 04/22/2018   Morbid obesity 04/22/2018   Generalized anxiety disorder 04/22/2018   Obstructive sleep apnea 01/30/2018   Major depressive disorder 01/30/2018   Pansinusitis 01/30/2018   Aortic atherosclerosis 10/10/2017   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   Hyperlipidemia 04/17/2017   Anemia, iron deficiency 04/17/2017   Atypical chest pain 08/31/2014   Arm weakness 08/31/2014   Hearing loss 07/24/2012   Hypothyroidism 11/02/2010   Essential hypertension 08/31/2010   Hyperglycemia, fasting 05/04/2010   Diastolic dysfunction 02/15/2010   Back pain with radiculopathy 01/25/2010   Fatigue 01/25/2010   Tobacco use 12/01/2008   Restless legs syndrome 12/01/2008   Leg pain, bilateral 11/17/2008   Palpitations 02/12/2008   GERD (gastroesophageal reflux disease) 07/16/2007   Hiatal hernia 07/16/2007   Knee pain 07/16/2007   Low back pain 07/16/2007   Peripheral vertigo 04/30/2007   Insomnia 04/30/2007   Abdominal pain, right upper quadrant 04/30/2007    ONSET DATE: 04/27/2024 (referral date)  REFERRING DIAG:  Diagnosis  R47.9 (ICD-10-CM) - Speech disturbance, unspecified type    THERAPY DIAG:  Cognitive communication deficit  Rationale for Evaluation and Treatment: Rehabilitation  SUBJECTIVE:   SUBJECTIVE STATEMENT: [name] returns after hospitalization 07/26/24-08/03/24 with kidney infection and sepsis Pt accompanied by: self  PERTINENT HISTORY: From neuro consult: Debra [name] is a very pleasant 68 y.o. RH female with a history of iron deficiency anemia, chronic back pain, GAD, MDD, hearing loss, hypertension, hyperlipidemia, RLS, GERD, DM2, vitamin D  deficiency, hypothyroidism and a diagnosis of mild  cognitive impairment likely of multiple etiologies expressive language impairment presenting today in follow-up for evaluation of memory loss. Patient was on donepezil  5 mg daily, lost her prescription, instructed to refill it. She lives alone, misses bills and medicines,  getting lost when driving. Discussed with her moving to assisted living for better surveillance. She is not interested in this change at this time.    PAIN:  Are you having pain? Yes, chronic, generalized  FALLS: Has patient fallen in last 6 months?  No  LIVING ENVIRONMENT: Lives with: lives alone Lives in: House/apartment  PLOF:  Level of assistance: Independent with ADLs, Independent with IADLs Employment: Part-time employment as needed security for [employer]  PATIENT GOALS: To be able to say what I want to  OBJECTIVE:  Note: Objective measures were completed at Evaluation unless otherwise noted.  DIAGNOSTIC FINDINGS: Neurospych consult: Ms. Pippins completed a comprehensive neuropsychological evaluation on 04/13/2024. Please refer to that encounter for the full report and recommendations. Briefly, results suggested primary deficits surrounding expressive language (confrontation naming and semantic fluency in particular) and executive functioning. Weaknesses were also exhibited across processing speed and encoding/retrieval aspects  of verbal memory. Relative to her previous evaluation in April 2022, decline was exhibited across executive functioning, particularly cognitive flexibility and abstract reasoning. Performances across other assessed domains exhibited relative stability over time. The etiology for ongoing dysfunction remains uncertain. There remains the potential for chronic medical factors, moderate microvascular ischemic disease as seen on her most recent brain MRI, untreated sleep apnea, chronic pain, and fairly prominent psychiatric distress to be playing an active role in her testing profile and clinical  presentation. Medication side effects, especially caused by paroxetine /Paxil , metoprolol  succinate/Toprol , prednisone , and hydrocodone , may also be playing an active role. 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 Relative stability over the past 2-3 years is encouraging and should limit these neurological concerns at the present time. However, continued monitoring of her outward language abilities will be extremely important moving forward.                                                                                                                                TREATMENT DATE:   Next: continue with bill management using budget sheet   09/08/24: She returns after hospitalization with sepsis. Ashly got a new pill dance movement psychotherapist and she is using the correct corresponding day to recall meds daily - she reports this is successful to recall meds daily. She is using her phone to add appointments to reminders on her phone. She has started working on de-cluttering her kitchen - she has gathered lawyer of magazines to donate this week. She continues to have difficulty recalling bills. Initiated designer, fashion/clothing by listing her bills with occasional min questioning cues to list monthly bills.  07/13/24: Evaluation completed. Initiated training in compensatory strategies for recall. The days have worn off of her pill organizer - she has tried to use marker but it wears off. She uses the organizer out of order. Instructed her to get another new pill organizer and use the correct day of the week when taking pills. Initiated strategy of using an appointment book as she has difficulty recalling an appointment when it is 6 months away - she uses My Chart for upcoming appointments however some of her specialty doctors don't use My Chart. Due to endorsed clutter and difficulty getting  started on organizing clutter, we generated strategy of using a timer and working for 30 minutes 5/7 days on reducing clutter.     PATIENT EDUCATION: Education details: See treatment, See Patient Instructions, compensations for aphasia and cognition Person educated: Patient Education method: Explanation, Demonstration, Verbal cues, and Handouts Education comprehension: verbal cues required and needs further education   GOALS: Goals reviewed with patient? Yes  SHORT TERM GOALS: Target date: 08/24/24  Pt will use external aids to recall meds over 1 week Baseline: Goal status: ONGOING  2.  Pt will use appointment book or other external aid to recall and manage appointments Baseline:  Goal status: ONGOING  3 Pt will carryover organization system to recall and manage bills with rare min A. Baseline:  Goal status: ONGOING  4.  Pt will reduce clutter in her kitchen by 50% subjectively to support cognition by using timer and schedule Baseline:  Goal status: ONGOING  5.  Pt will use compensations for aphasia in structured language task 4/5 opportunities with occasional min A Baseline:  Goal status: ONGOING   LONG TERM GOALS: Target date: 10/05/24  Pt will recall meds and bills with rare min A over 2 weeks Baseline:  Goal status: ONGOING  2.  Pt will use compensations for aphasia 3/4 opportunities in conversation with occasional min A Baseline:  Goal status: ONGOING  3.  Pt will use external aids/agenda to recall and manage appointments Baseline:  Goal status: ONGOING  4.  Pt will reduce clutter in main living area by 50% using strategies of time and schedule Baseline:  Goal status: ONGOING   ASSESSMENT:  CLINICAL IMPRESSION: Patient is a 68 y.o. female who was seen today for mild cognitive impairment and mild aphasia. She reports difficulty recalling meds and bills. She also reports difficulty keeping organized and managing clutter in the home. She states is  difficulty to initiate reducing clutter and she feels overwhelmed with the amount of clutter thorough out the home. She is known to us  from prior course of ST where she can for 2 visits, then again in Feb of 2025, however she states she doesn't recall ever being in the ST office and denies prior ST indicating poor memory. She is getting lost and drove around for a while looking for this building. I fear she is high risk of being scammed. I recommend skilled ST to maximize communication and cognition for safety and independence.    OBJECTIVE IMPAIRMENTS: include attention, memory, executive functioning, and aphasia. These impairments are limiting patient from managing medications, managing appointments, managing finances, household responsibilities, ADLs/IADLs, and effectively communicating at home and in community. Factors affecting potential to achieve goals and functional outcome are ability to learn/carryover information and family/community support. Patient will benefit from skilled SLP services to address above impairments and improve overall function.  REHAB POTENTIAL: Good  PLAN:  SLP FREQUENCY: 1-2x/week  SLP DURATION: 12 weeks  PLANNED INTERVENTIONS: Language facilitation, Environmental controls, Cueing hierachy, Cognitive reorganization, Internal/external aids, Functional tasks, Multimodal communication approach, SLP instruction and feedback, Compensatory strategies, Patient/family education, and [id number] Treatment of speech (30 or 45 min)     Doratha Mcswain, Leita Caldron, CCC-SLP 09/08/2024, 2:58 PM

## 2024-09-09 ENCOUNTER — Telehealth: Payer: Self-pay | Admitting: Family Medicine

## 2024-09-09 NOTE — Telephone Encounter (Signed)
 Pt called back for an update because she has not heard back. She asked to speak with pcp's nurse.Please call and advise.

## 2024-09-09 NOTE — Telephone Encounter (Signed)
 Copied from CRM [id number]. Topic: General - Other >> Sep 08, 2024  4:50 PM Wess RAMAN wrote: Reason for CRM: Patient would like her ED paperwork printed and a note from Dr. Antonio stating she is good to return to work.  Callback #: [id number]

## 2024-09-10 ENCOUNTER — Other Ambulatory Visit (INDEPENDENT_AMBULATORY_CARE_PROVIDER_SITE_OTHER)

## 2024-09-10 DIAGNOSIS — E039 Hypothyroidism, unspecified: Secondary | ICD-10-CM

## 2024-09-10 LAB — COMPREHENSIVE METABOLIC PANEL WITH GFR
ALT: 14 U/L (ref 0–35)
AST: 20 U/L (ref 0–37)
Albumin: 4.2 g/dL (ref 3.5–5.2)
Alkaline Phosphatase: 104 U/L (ref 39–117)
BUN: 25 mg/dL — ABNORMAL HIGH (ref 6–23)
CO2: 32 meq/L (ref 19–32)
Calcium: 10 mg/dL (ref 8.4–10.5)
Chloride: 101 meq/L (ref 96–112)
Creatinine, Ser: 0.84 mg/dL (ref 0.40–1.20)
GFR: 71.05 mL/min (ref >60.00)
Glucose, Bld: 103 mg/dL — ABNORMAL HIGH (ref 70–99)
Potassium: 4.1 meq/L (ref 3.5–5.1)
Sodium: 137 meq/L (ref 135–145)
Total Bilirubin: 0.3 mg/dL (ref 0.2–1.2)
Total Protein: 7.9 g/dL (ref 6.0–8.3)

## 2024-09-10 NOTE — Telephone Encounter (Signed)
 Pt walked in. Letter printed off for patient.

## 2024-09-13 LAB — THYROID PANEL WITH TSH
Free Thyroxine Index: 1.2 — ABNORMAL LOW (ref 1.4–3.8)
T3 Uptake: 44 % — ABNORMAL HIGH (ref 22–35)
T4, Total: 2.8 ug/dL — ABNORMAL LOW (ref 5.1–11.9)
TSH: 1.76 m[IU]/L (ref 0.40–4.50)

## 2024-09-13 LAB — THYROID PEROXIDASE ANTIBODY: Thyroperoxidase Ab SerPl-aCnc: <1 [IU]/mL (ref <9)

## 2024-09-14 ENCOUNTER — Encounter: Payer: Self-pay | Admitting: Speech Pathology

## 2024-09-14 ENCOUNTER — Ambulatory Visit: Admitting: Speech Pathology

## 2024-09-14 DIAGNOSIS — R41841 Cognitive communication deficit: Secondary | ICD-10-CM | POA: Diagnosis not present

## 2024-09-14 NOTE — Therapy (Signed)
 OUTPATIENT SPEECH LANGUAGE PATHOLOGY APHASIA TREATMENT   Patient Name: Debra [name] MRN: [id number] DOB:10-18-1956, 68 y.o., female Today's Date: 09/14/2024  PCP: Debra Cyndee Jamee JONELLE, DO REFERRING PROVIDER: Dina Camie BRAVO, PA-C  END OF SESSION:  End of Session - 09/14/24 1418     Visit Number 3    Number of Visits 25    Date for Recertification  10/05/24    SLP Start Time 1408   arrived late   SLP Stop Time  1445    SLP Time Calculation (min) 37 min    Activity Tolerance Patient tolerated treatment well          Past Medical History:  Diagnosis Date   Abdominal pain, right upper quadrant 04/30/2007   Allergies 02/03/2019   Anemia, iron deficiency 04/17/2017   Aortic atherosclerosis 10/10/2017   Atypical chest pain 08/31/2014   Back pain with radiculopathy 01/25/2010   Check xray --- ? Cause of leg weakness   Chronic pain of both shoulders 10/31/2020   Cognitive deficits 2022   Constipation    DDD (degenerative disc disease)    Diastolic dysfunction 02/15/2010   Epilepsy    Childhood seziures; pt reports that she grew out of them and they have not occurred since childhood   Essential hypertension 08/31/2010   Poorly controlled will alter medications, encouraged DASH diet, minimize caffeine and obtain adequate sleep. Report concerning symptoms and follow up as directed and as needed [employer] metoprolol  Edema may be c   Expressive language impairment 04/13/2024   Fatigue 01/25/2010   Generalized anxiety disorder 04/22/2018   Stable co'nt meds   GERD (gastroesophageal reflux disease) 07/16/2007   Hearing loss 07/24/2012   Refer to hearing clinic   Hiatal hernia 07/16/2007   Hyperglycemia, fasting 05/04/2010   Hyperlipidemia 04/17/2017   Encouraged heart healthy diet, increase exercise, avoid trans fats, consider a krill oil cap daily   Hypothyroidism 11/02/2010   con't meds; Lab Results Component Value TSH 1.44 08/12/2017   Insomnia 04/30/2007   Knee  pain 07/16/2007   Leg pain, bilateral 11/17/2008   Low back pain 07/16/2007   Major depressive disorder 01/30/2018   con't meds F/u counselor   Mild cognitive impairment of uncertain or unknown etiology 01/30/2021   Mixed connective tissue disease    with Raynaud's   Obstructive sleep apnea 01/30/2018   F/u with pulm; May be cause of some of her memory/concentration problems   Osteoarthritis    Osteoporosis    Palpitations 02/12/2008   Pansinusitis 01/30/2018   Peripheral vertigo 04/30/2007   Resolved rto prn   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   Plantar wart 05/06/2008   Restless legs syndrome 12/01/2008   Stomach ulcer    Swallowing difficulty    Type 2 diabetes mellitus with hyperglycemia, without long-term current use of insulin  08/09/2022   Upper respiratory tract infection 12/14/2019   Urge incontinence of urine 10/06/2020   Vaginal discharge 05/25/2021   Vitamin D  deficiency    Wound infection 10/04/2021   Wound of right ankle 09/27/2021   Past Surgical History:  Procedure Laterality Date   CARPAL TUNNEL RELEASE     Bilateral   CERVICAL SPINE SURGERY     x3   CESAREAN SECTION     x3   LEG SURGERY     Right    WRIST FRACTURE SURGERY  10/02/2015   Pt have surgery twice on the same wrist.   Patient Active Problem List   Diagnosis Date Noted  Pyelonephritis 07/26/2024   Overactive bladder 05/28/2024   Chronic idiopathic constipation 05/28/2024   Sacrodynia 04/15/2024   Pelvic mass 04/15/2024   Expressive language impairment 04/13/2024   Type 2 diabetes mellitus with hyperglycemia, without long-term current use of insulin  08/09/2022   Snapping lateral band due to ligament laxity 02/19/2022   Polyuria 01/01/2022   SUI (stress urinary incontinence, female) 01/01/2022   Trigger thumb, right thumb 09/13/2021   Pruritic dermatitis 05/25/2021   Balance disorder 05/25/2021   Dizziness 05/25/2021   Mild cognitive impairment of uncertain or unknown etiology  01/30/2021   Osteoarthritis    Vitamin D  deficiency    Urinary frequency 10/31/2020   Chronic pain of both shoulders 10/31/2020   Female pattern baldness 10/06/2020   Myalgia 10/06/2020   Urge incontinence of urine 10/06/2020   Allergies 02/03/2019   Acute vaginitis 04/22/2018   Morbid obesity 04/22/2018   Generalized anxiety disorder 04/22/2018   Obstructive sleep apnea 01/30/2018   Major depressive disorder 01/30/2018   Pansinusitis 01/30/2018   Aortic atherosclerosis 10/10/2017   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   Hyperlipidemia 04/17/2017   Anemia, iron deficiency 04/17/2017   Atypical chest pain 08/31/2014   Arm weakness 08/31/2014   Hearing loss 07/24/2012   Hypothyroidism 11/02/2010   Essential hypertension 08/31/2010   Hyperglycemia, fasting 05/04/2010   Diastolic dysfunction 02/15/2010   Back pain with radiculopathy 01/25/2010   Fatigue 01/25/2010   Tobacco use 12/01/2008   Restless legs syndrome 12/01/2008   Leg pain, bilateral 11/17/2008   Palpitations 02/12/2008   GERD (gastroesophageal reflux disease) 07/16/2007   Hiatal hernia 07/16/2007   Knee pain 07/16/2007   Low back pain 07/16/2007   Peripheral vertigo 04/30/2007   Insomnia 04/30/2007   Abdominal pain, right upper quadrant 04/30/2007    ONSET DATE: 04/27/2024 (referral date)  REFERRING DIAG:  Diagnosis  R47.9 (ICD-10-CM) - Speech disturbance, unspecified type    THERAPY DIAG:  Cognitive communication deficit  Rationale for Evaluation and Treatment: Rehabilitation  SUBJECTIVE:   SUBJECTIVE STATEMENT: Debra [name] returns after hospitalization 07/26/24-08/03/24 with kidney infection and sepsis Pt accompanied by: self  PERTINENT HISTORY: From neuro consult: Debra [name] is a very pleasant 68 y.o. RH female with a history of iron deficiency anemia, chronic back pain, GAD, MDD, hearing loss, hypertension, hyperlipidemia, RLS, GERD, DM2, vitamin D  deficiency, hypothyroidism and a diagnosis of mild  cognitive impairment likely of multiple etiologies expressive language impairment presenting today in follow-up for evaluation of memory loss. Patient was on donepezil  5 mg daily, lost her prescription, instructed to refill it. She lives alone, misses bills and medicines,  getting lost when driving. Discussed with her moving to assisted living for better surveillance. She is not interested in this change at this time.    PAIN:  Are you having pain? Yes, chronic, generalized  FALLS: Has patient fallen in last 6 months?  No  LIVING ENVIRONMENT: Lives with: lives alone Lives in: House/apartment  PLOF:  Level of assistance: Independent with ADLs, Independent with IADLs Employment: Part-time employment as needed security for [employer]  PATIENT GOALS: To be able to say what I want to  OBJECTIVE:  Note: Objective measures were completed at Evaluation unless otherwise noted.  DIAGNOSTIC FINDINGS: Neurospych consult: Ms. Guest completed a comprehensive neuropsychological evaluation on 04/13/2024. Please refer to that encounter for the full report and recommendations. Briefly, results suggested primary deficits surrounding expressive language (confrontation naming and semantic fluency in particular) and executive functioning. Weaknesses were also exhibited across processing speed and encoding/retrieval aspects  of verbal memory. Relative to her previous evaluation in April 2022, decline was exhibited across executive functioning, particularly cognitive flexibility and abstract reasoning. Performances across other assessed domains exhibited relative stability over time. The etiology for ongoing dysfunction remains uncertain. There remains the potential for chronic medical factors, moderate microvascular ischemic disease as seen on her most recent brain MRI, untreated sleep apnea, chronic pain, and fairly prominent psychiatric distress to be playing an active role in her testing profile and clinical  presentation. Medication side effects, especially caused by paroxetine /Paxil , metoprolol  succinate/Toprol , prednisone , and hydrocodone , may also be playing an active role. 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 Relative stability over the past 2-3 years is encouraging and should limit these neurological concerns at the present time. However, continued monitoring of her outward language abilities will be extremely important moving forward.                                                                                                                                TREATMENT DATE:   09/14/24: Davyn has continued to reduce clutter, this week getting books off of the floor and on a shelf and has plan to donate items/magazines. Today, we targeted bill organization to aid in recall of bill payments. We generated list of monthly bills with occasional min A to ID bills, carrier/company, monthly amount. She did not recall due dates despite cues of beginning, middle or end of month. She is to fill in due dates for [facility] - to help recall bills and plan a budget to pay them. She continues to successfully mange meds with new pill organizer. She is using her phone to manage her appointments independently. She was surprised to learn Thanksgiving is next week and became overwhelmed with tasks she needs to complete before house guests arrive. With frequent questioning cues, we generated to do list and she Id'd priorities, however, she required frequent mod verbal cues and question cues to problem solve for more complex tasks, for example, where to find a book shelf fully assembled, then how to get the shelf into her house.   09/08/24: She returns after hospitalization with sepsis. Briza got a new pill dance movement psychotherapist and she is using the correct corresponding day to recall meds daily - she reports  this is successful to recall meds daily. She is using her phone to add appointments to reminders on her phone. She has started working on de-cluttering her kitchen - she has gathered lawyer of magazines to donate this week. She continues to have difficulty recalling bills. Initiated designer, fashion/clothing by listing her bills with occasional min questioning cues to list monthly bills.  07/13/24: Evaluation completed. Initiated training in compensatory strategies for recall. The days have worn off of her pill organizer - she has tried to use marker but it wears off. She uses the organizer out of order. Instructed  her to get another new pill organizer and use the correct day of the week when taking pills. Initiated strategy of using an appointment book as she has difficulty recalling an appointment when it is 6 months away - she uses My Chart for upcoming appointments however some of her specialty doctors don't use My Chart. Due to endorsed clutter and difficulty getting started on organizing clutter, we generated strategy of using a timer and working for 30 minutes 5/7 days on reducing clutter.     PATIENT EDUCATION: Education details: See treatment, See Patient Instructions, compensations for aphasia and cognition Person educated: Patient Education method: Explanation, Demonstration, Verbal cues, and Handouts Education comprehension: verbal cues required and needs further education   GOALS: Goals reviewed with patient? Yes  SHORT TERM GOALS: Target date: 08/24/24  Pt will use external aids to recall meds over 1 week Baseline: Goal status: MET  2.  Pt will use appointment book or other external aid to recall and manage appointments Baseline:  Goal status: MET  3 Pt will carryover organization system to recall and manage bills with rare min A. Baseline:  Goal status: NOT MET  4.  Pt will reduce clutter in her kitchen by 50% subjectively to support cognition by using timer and  schedule Baseline:  Goal status: MET  5.  Pt will use compensations for aphasia in structured language task 4/5 opportunities with occasional min A Baseline:  Goal status: MET   LONG TERM GOALS: Target date: 10/05/24  Pt will recall meds and bills with rare min A over 2 weeks Baseline:  Goal status: ONGOING  2.  Pt will use compensations for aphasia 3/4 opportunities in conversation with occasional min A Baseline:  Goal status: ONGOING  3.  Pt will use external aids/agenda to recall and manage appointments Baseline:  Goal status: ONGOING  4.  Pt will reduce clutter in main living area by 50% using strategies of time and schedule Baseline:  Goal status:ONGOING   ASSESSMENT:  CLINICAL IMPRESSION: Patient is a 68 y.o. female who was seen today for mild cognitive impairment and mild aphasia. She reports difficulty recalling meds and bills. She also reports difficulty keeping organized and managing clutter in the home. She states is difficulty to initiate reducing clutter and she feels overwhelmed with the amount of clutter thorough out the home. She is known to us  from prior course of ST where she can for 2 visits, then again in Feb of 2025, however she states she doesn't recall ever being in the ST office and denies prior ST indicating poor memory. She is getting lost and drove around for a while looking for this building. I fear she is high risk of being scammed. I recommend skilled ST to maximize communication and cognition for safety and independence.    OBJECTIVE IMPAIRMENTS: include attention, memory, executive functioning, and aphasia. These impairments are limiting patient from managing medications, managing appointments, managing finances, household responsibilities, ADLs/IADLs, and effectively communicating at home and in community. Factors affecting potential to achieve goals and functional outcome are ability to learn/carryover information and family/community support.  Patient will benefit from skilled SLP services to address above impairments and improve overall function.  REHAB POTENTIAL: Good  PLAN:  SLP FREQUENCY: 1-2x/week  SLP DURATION: 12 weeks  PLANNED INTERVENTIONS: Language facilitation, Environmental controls, Cueing hierachy, Cognitive reorganization, Internal/external aids, Functional tasks, Multimodal communication approach, SLP instruction and feedback, Compensatory strategies, Patient/family education, and [id number] Treatment of speech (30 or 45 min)  Lexie Morini, Leita Caldron, CCC-SLP 09/14/2024, 2:22 PM

## 2024-09-15 ENCOUNTER — Encounter: Admitting: Speech Pathology

## 2024-09-16 ENCOUNTER — Ambulatory Visit: Payer: Self-pay | Admitting: Family Medicine

## 2024-09-16 ENCOUNTER — Other Ambulatory Visit: Payer: Self-pay | Admitting: Family Medicine

## 2024-09-16 ENCOUNTER — Ambulatory Visit: Admitting: Family Medicine

## 2024-09-16 DIAGNOSIS — E039 Hypothyroidism, unspecified: Secondary | ICD-10-CM

## 2024-09-17 ENCOUNTER — Telehealth (HOSPITAL_BASED_OUTPATIENT_CLINIC_OR_DEPARTMENT_OTHER): Payer: Self-pay | Admitting: Pharmacist

## 2024-09-17 ENCOUNTER — Other Ambulatory Visit: Payer: Self-pay | Admitting: Pharmacist

## 2024-09-17 MED ORDER — LEVOTHYROXINE SODIUM 75 MCG PO TABS: 75.0000 ug | ORAL_TABLET | Freq: Every day | ORAL | 0 refills | Status: DC

## 2024-09-17 NOTE — Telephone Encounter (Signed)
 Attempt was made to contact patient by phone today for follow up by Clinical Pharmacist regarding Ozempic  dose and diabetes.  Unable to reach patient. LM on VM with my contact number [phone].

## 2024-09-29 ENCOUNTER — Encounter: Admitting: Speech Pathology

## 2024-09-30 ENCOUNTER — Telehealth: Payer: Self-pay | Admitting: Pharmacist

## 2024-09-30 NOTE — Progress Notes (Signed)
 Pharmacy Quality Measure Review  This patient is appearing on a report for being at risk of failing the adherence measure for diabetes medications this calendar year.   Medication: Ozempic  0.5mg   Last fill date: 08/30/2024 for 28 day supply  She is due to increase to 1mg  weekly dose. Called patient and she has not picked up the 1mg  dose yet but states she is due to take a dose today. She thought she had 1 dose left but she is completely out.    Lab Results  Component Value Date   HGBA1C 6.1 (H) 08/13/2024    Wt Readings from Last 3 Encounters:  08/24/24 151 lb (68.5 kg)  08/13/24 148 lb 6.4 oz (67.3 kg)  07/26/24 154 lb 8.7 oz (70.1 kg)     Called [facility] and coordinated refill of Ozempic  1mg  - filled today and patient will pick up later today.   Madelin Ray, PharmD Clinical Pharmacist [facility]  Population Health [phone]

## 2024-10-05 ENCOUNTER — Ambulatory Visit

## 2024-10-06 ENCOUNTER — Encounter: Admitting: Speech Pathology

## 2024-10-12 ENCOUNTER — Ambulatory Visit: Attending: Physician Assistant | Admitting: Speech Pathology

## 2024-10-27 NOTE — Progress Notes (Incomplete)
 "   Mild Cognitive Impairment of unclear etiology   Debra [name] is a very pleasant 68 y.o. RH female with a history of iron deficiency anemia, chronic back pain, GAD, MDD, hearing loss, hypertension, hyperlipidemia, RLS, GERD, DM2, vitamin D  deficiency, hypothyroidism and a diagnosis of mild cognitive impairment likely of multiple etiologies expressive language impairment  presenting today in follow-up for evaluation of memory loss. Patient is on donepezil  5 mg daily but it is unclear her adherence to the medication.  Most recent metabolic PET scan a 125 was negative for Alzheimer's disease or FTD.  She was last seen on 04/27/2024.  Memory is***.  MMSE today is  /30.  She continues to smoke.  Mood is anxious.  Patient continues to live alone, misses bills, medicines, getting lost when driving.  Discussed with her the role of assisted living for better surveillance and she is not interested in changing at this time.  She is here alone.   Follow-up in 1 year Recommend resuming donepezil  5 mg daily, side effects discussed.  Goal of 10 mg daily if if compliant with the medication. Repeat neuropsych evaluation in 12 to 18 months for diagnostic clarity Repeat neuropsych evaluation for diagnostic clarity and disease trajectory*** Recommend speech therapy for speech difficulties Recommend using CPAP for OSA Continue management of iron deficiency anemia Highly recommend resuming counseling for MDD and GAD Continue PT for chronic pain Recommend checking with audiologist for decreased hearing in an effort to improve comprehension Tobacco cessation counseled Recommend good control of cardiovascular risk factors Monitor driving***     Discussed the use of AI scribe software for clinical note transcription with the patient, who gave verbal consent to proceed.  History of Present Illness       Any changes in memory since last visit? May be worse.  She has some difficulty remembering recent  conversations and names, but not different from prior. She has not been taking the medication , at least 4 months. repeats oneself?  Denies, but she has to ask people to repeat a question. Disoriented when walking into a room?  Patient denies   Misplacing objects?  Patient denies   Wandering behavior?   Denies. Any personality changes since last visit?  As before, she has been going through significant amount of stress.  She is yet to seek counseling I misplaced the numbers. Any worsening depression?:  She has a history of depression and anxiety, has not been following with counseling.  Hallucinations or paranoia? Sometimes I think I see a child but it is not, may be a spirit Seizures?   Denies.    Any sleep changes?  Does not sleep very well has a history of insomnia.  Takes trazodone  with some relief. She has vivid dreams but denies dream reenactment.  Denies sleepwalking.    Sleep apnea?   denies   Any hygiene concerns?   Denies.   Independent of bathing and dressing?  Endorsed  Does the patient needs help with medications? Patient is in charge, admits to missed doses  Who is in charge of the finances?  Patient is in charge, missing bills      Any changes in appetite?  denies.  Admits not drinking enough water    Patient have trouble swallowing?  Denies.   Does the patient cook?  Yes, she does forget her own recipes.   Any kitchen accidents such as leaving the stove on?   Denies.   Any headaches?    Denies.  Vision changes? Denies. Chronic pain?  Denies.   Ambulates with difficulty?  She admits to being deconditioned, trying to exercise, she walks in the park sometimes   Recent falls or head injuries?  Fell on the tailbone 4 weeks ago so it hurts a little, waiting to heal.  Unilateral weakness, numbness or tingling?  Denies.   Any tremors?  Denies.   Any anosmia?    Denies.   Any incontinence of urine?  Urge incontinence, overactive bladder wears pads Any bowel dysfunction?   Occasional constipation  patient lives alone.   Does the patient drive?  Endorsed, sometimes he needs GPS.She got lost 2 weeks ago  Tobacco?  About 1 pack a day    MRI of the brain 02/2023, personally reviewed, without disproportionate lobar atrophy, but with moderate small vessel ischemic disease but without acute findings.   Initial visit 03/13/2023 How long did patient have memory difficulties?  For several years, worse over the last year .Patient has some difficulty remembering recent conversations and people names. It is worse after Covid 2 years ago.  I was tested in the past for ADD, may have a slight but not a lot repeats oneself?  Denies repeating.  It' s me the one that is asking them to repeat .  Disoriented when walking into a room?  Patient denies except occasionally not remembering what patient came to the room for.    Leaving objects in unusual places?  I tried to get rid of stuff.  I have been trying to declog my house . Wandering behavior?  denies   Any personality changes since last visit?  Patient denies   Any history of depression?:  Endorsed, she has a history of depression and anxiety, followed a counselor but have not seen them in a while . Hallucinations or paranoia?  P I have seen something run across but turn my head and there is no one, not often .   Seizures?   I had epilepsy as a child   Any sleep changes?  She has insomnia . Does not sleep well, wakes up during the night, yesterday she slept 3 hrs. Takes Requip that helps.  Sometimes she has vivid dreams, denies  REM behavior or sleepwalking   Sleep apnea? Endorsed, years ago I got one, but I stopped using it for years the CPAP is sitting out there.    Any hygiene concerns?  Patient denies   Independent of bathing and dressing?  Endorsed  Does the patient needs help with medications?  Patient is in charge, misses doses   Who is in charge of the finances?  Patient is in charge, has missed bills in  the past      Any changes in appetite?  I am on something for my DM, Ozempic , to lose weight . Not drinking enough water  Patient have trouble swallowing? denies   Does the patient cook?  Yes. Forgets common recipes  Any kitchen accidents such as leaving the stove on? denies   Any headaches?   denies   Chronic back pain ? Endorsed, takes pain meds did physical therapy for a while , It was rough Ambulates with difficulty?  Chronic, Sometimes I lose the strength in my legs.  Recent falls or head injuries? Endorsed, sometimes my legs are weaker. I am to attend PT for strength Vision changes?  She had recent cataract surgery it did not help much  Unilateral weakness, numbness or tingling? denies   Any tremors?  denies   Any anosmia?  denies   Any incontinence of urine?  She has a history of urge incontinence. Wears pads  Any bowel dysfunction?  Some constipation    Patient lives alone  History of heavy alcohol intake?  Denies  history of heavy tobacco use?  Trying to quit, uses nicotine  patch. Was doing 1/2 ppd but stopped 4 weeks ago  Family history of dementia? Aunt has dementia unknown type Does patient drive? Endorsed, sometimes I have to use my GPS   Neuropsych evaluation 04/13/2024 , Dr. Richie briefly, results suggested primary deficits surrounding expressive language (confrontation naming and semantic fluency in particular) and executive functioning. Weaknesses were also exhibited across processing speed and encoding/retrieval aspects of verbal memory. Relative to her previous evaluation in April 2022, decline was exhibited across executive functioning, particularly cognitive flexibility and abstract reasoning. Performances across other assessed domains exhibited relative stability over time. The etiology for ongoing dysfunction remains uncertain. There remains the potential for chronic medical factors, moderate microvascular ischemic disease as seen on her most recent brain MRI,  untreated sleep apnea, chronic pain, and fairly prominent psychiatric distress to be playing an active role in her testing profile and clinical presentation. Medication side effects, especially caused by paroxetine /Paxil , metoprolol  succinate/Toprol , prednisone , and hydrocodone , may also be playing an active role. 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 Relative stability over the past 2-3 years is encouraging and should limit these neurological concerns at the present time. However, continued monitoring of her outward language abilities will be extremely important moving forward.       Past Medical History:  Diagnosis Date   Abdominal pain, right upper quadrant 04/30/2007   Allergies 02/03/2019   Anemia, iron deficiency 04/17/2017   Aortic atherosclerosis 10/10/2017   Atypical chest pain 08/31/2014   Back pain with radiculopathy 01/25/2010   Check xray --- ? Cause of leg weakness   Chronic pain of both shoulders 10/31/2020   Cognitive deficits 2022   Constipation    DDD (degenerative disc disease)    Diastolic dysfunction 02/15/2010   Epilepsy    Childhood seziures; pt reports that she grew out of them and they have not occurred since childhood   Essential hypertension 08/31/2010   Poorly controlled will alter medications, encouraged DASH diet, minimize caffeine and obtain adequate sleep. Report concerning symptoms and follow up as directed and as needed [employer] metoprolol  Edema may be c   Expressive language impairment 04/13/2024   Fatigue 01/25/2010   Generalized anxiety disorder 04/22/2018   Stable co'nt meds   GERD (gastroesophageal reflux disease) 07/16/2007   Hearing loss 07/24/2012   Refer to hearing clinic   Hiatal hernia 07/16/2007   Hyperglycemia, fasting 05/04/2010   Hyperlipidemia 04/17/2017   Encouraged heart healthy diet, increase  exercise, avoid trans fats, consider a krill oil cap daily   Hypothyroidism 11/02/2010   con't meds; Lab Results Component Value TSH 1.44 08/12/2017   Insomnia 04/30/2007   Knee pain 07/16/2007   Leg pain, bilateral 11/17/2008   Low back pain 07/16/2007   Major depressive disorder 01/30/2018   con't meds F/u counselor   Mild cognitive impairment of uncertain or unknown etiology 01/30/2021   Mixed connective tissue disease    with Raynaud's   Obstructive sleep apnea 01/30/2018   F/u with pulm; May be cause of some of her memory/concentration problems   Osteoarthritis    Osteoporosis  Palpitations 02/12/2008   Pansinusitis 01/30/2018   Peripheral vertigo 04/30/2007   Resolved rto prn   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   Plantar wart 05/06/2008   Restless legs syndrome 12/01/2008   Stomach ulcer    Swallowing difficulty    Type 2 diabetes mellitus with hyperglycemia, without long-term current use of insulin  08/09/2022   Upper respiratory tract infection 12/14/2019   Urge incontinence of urine 10/06/2020   Vaginal discharge 05/25/2021   Vitamin D  deficiency    Wound infection 10/04/2021   Wound of right ankle 09/27/2021     Past Surgical History:  Procedure Laterality Date   CARPAL TUNNEL RELEASE     Bilateral   CERVICAL SPINE SURGERY     x3   CESAREAN SECTION     x3   LEG SURGERY     Right    WRIST FRACTURE SURGERY  10/02/2015   Pt have surgery twice on the same wrist.         Objective:     PHYSICAL EXAMINATION:    VITALS:  There were no vitals filed for this visit.  GEN:  The patient appears stated age and is in NAD. HEENT:  Normocephalic, atraumatic.   Neurological examination:  General: NAD, well-groomed, appears stated age. Orientation: The patient is alert. Oriented to person, place and not to date.*** Cranial nerves: There is good facial symmetry.The speech is not fluent and clear. No aphasia or dysarthria. Fund of knowledge is appropriate. Recent  memory impaired and remote memory is normal.  Attention and concentration are normal.  Able to name objects and repeat phrases.  Hearing is intact to conversational tone ***.   Delayed recall *** Sensation: Sensation is intact to light touch throughout Motor: Strength is at least antigravity x4. DTR's 2/4 in UE/LE      03/13/2023   12:00 PM  Montreal Cognitive Assessment   Visuospatial/ Executive (0/5) 4  Naming (0/3) 3  Attention: Read list of digits (0/2) 2  Attention: Read list of letters (0/1) 1  Attention: Serial 7 subtraction starting at 100 (0/3) 1  Language: Repeat phrase (0/2) 1  Language : Fluency (0/1) 0  Abstraction (0/2) 1  Delayed Recall (0/5) 3  Orientation (0/6) 6  Total 22  Adjusted Score (based on education) 22       01/06/2024    3:31 PM 12/05/2023    2:00 PM  MMSE - Mini Mental State Exam  Not completed: Unable to complete   Orientation to time  5  Orientation to Place  5  Registration  3  Attention/ Calculation  5  Recall  2  Language- name 2 objects  2  Language- repeat  1  Language- follow 3 step command  3  Language- read & follow direction  1  Write a sentence  1  Copy design  1  Total score  29      Movement examination: Tone: There is normal tone in the UE/LE Abnormal movements:  no tremor.  No myoclonus.  No asterixis.   Coordination:  There is no decremation with RAM's. Normal finger to nose  Gait and Station: The patient has no difficulty arising out of a deep-seated chair without the use of the hands. The patient's stride length is good.  Gait is cautious and narrow.   Thank you for allowing us  the opportunity to participate in the care of this nice patient. Please do not hesitate to contact us  for any questions or concerns.   Total time spent  on today's visit was *** minutes dedicated to this patient today, preparing to see patient, examining the patient, ordering tests and/or medications and counseling the patient, documenting clinical  information in the EHR or other health record, independently interpreting results and communicating results to the patient/family, discussing treatment and goals, answering patient's questions and coordinating care.  Cc:  Antonio Cyndee Jamee JONELLE ROSALEA Camie [facility] 10/27/2024 7:46 AM      "

## 2024-10-29 ENCOUNTER — Ambulatory Visit: Admitting: Physician Assistant

## 2024-10-29 ENCOUNTER — Encounter: Payer: Self-pay | Admitting: Physician Assistant

## 2024-11-07 ENCOUNTER — Other Ambulatory Visit: Payer: Self-pay | Admitting: Family Medicine

## 2024-11-07 DIAGNOSIS — I10 Essential (primary) hypertension: Secondary | ICD-10-CM

## 2024-11-11 NOTE — Progress Notes (Unsigned)
 No chief complaint on file.   Debra [name] is a 69 y.o. female here for evaluation of {Desc; right/left/bilateral:5002} headache.  HA start in morning--- CPAP or teeth grining???  PMHx- DDD, GAD, GERD, Hypothyroidism, Mild cognitive impairment, OA, Osteoporosis, Type 2 DM  She has Hx of HA  Treatment:  Aura:  Palliation: *** Provocation: *** Associated symptoms: {Symptoms; headache:16515} Denies: Gum chewing, jaw pain, injury, {Symptoms; headache:16515} Currently with headache? {yes/no:20286} Failed therapies: {THERAPIES; HEADACHE:18921}  There were no vitals taken for this visit. General: awake, alert, appearing stated age Eyes: PERRLA, EOMi Heart: RRR, no murmurs, no bruits Lungs: CTAB, no accessory muscle use Neuro: CN 2-12 intact, no cerebellar signs, DTR's equal and symmetry, no clonus MSK: 5/5 strength throughout, normal gait, no TTP over posterior cervical triangle or paraspinal cervical musculature Psych: Age appropriate judgment and insight, mood and affect normal  No diagnosis found.  Orders as above. Follow up in 4 weeks. The patient voiced understanding and agreement to the plan.  Harlene LITTIE Jolly, DNP, AGNP-C 5:34 PM 11/11/24

## 2024-11-12 ENCOUNTER — Ambulatory Visit (INDEPENDENT_AMBULATORY_CARE_PROVIDER_SITE_OTHER): Admitting: Student

## 2024-11-12 ENCOUNTER — Encounter: Payer: Self-pay | Admitting: Student

## 2024-11-12 VITALS — BP 136/76 | HR 69 | Temp 98.2°F | Ht 62.0 in | Wt 151.0 lb

## 2024-11-12 DIAGNOSIS — R350 Frequency of micturition: Secondary | ICD-10-CM

## 2024-11-12 DIAGNOSIS — I10 Essential (primary) hypertension: Secondary | ICD-10-CM

## 2024-11-12 LAB — POC URINALSYSI DIPSTICK (AUTOMATED)
Bilirubin, UA: NEGATIVE
Blood, UA: NEGATIVE
Glucose, UA: NEGATIVE
Ketones, UA: NEGATIVE
Nitrite, UA: NEGATIVE
Protein, UA: NEGATIVE
Spec Grav, UA: 1.01
Urobilinogen, UA: 0.2 U/dL
pH, UA: 6

## 2024-11-12 NOTE — Progress Notes (Signed)
 Chief Complaint  Patient presents with   Headache   Urinary Frequency    X1 week    Debra [name] is a 69 y.o. female here for possible UTI.  The patient presents with urinary symptoms consistent with a urinary tract infection, including dysuria, urinary frequency, and urgency that began 5 ago.  She denies fever, chills, flank pain, nausea, vomiting, or vaginal discharge.  Reports occasional HA in the mornings. Denies dizziness, vision changes, N/V. She has had HA in the past, has not tried anything OTC for HA.  Patient denies fever, chills, SOB, CP, palpitations, dyspnea, edema, HA, vision changes, N/V/D, abdominal pain, rash, weight changes, and recent illness or hospitalizations.     Past Medical History:  Diagnosis Date   Abdominal pain, right upper quadrant 04/30/2007   Allergies 02/03/2019   Anemia, iron deficiency 04/17/2017   Aortic atherosclerosis 10/10/2017   Atypical chest pain 08/31/2014   Back pain with radiculopathy 01/25/2010   Check xray --- ? Cause of leg weakness   Chronic pain of both shoulders 10/31/2020   Cognitive deficits 2022   Constipation    DDD (degenerative disc disease)    Diastolic dysfunction 02/15/2010   Epilepsy    Childhood seziures; pt reports that she grew out of them and they have not occurred since childhood   Essential hypertension 08/31/2010   Poorly controlled will alter medications, encouraged DASH diet, minimize caffeine and obtain adequate sleep. Report concerning symptoms and follow up as directed and as needed [employer] metoprolol  Edema may be c   Expressive language impairment 04/13/2024   Fatigue 01/25/2010   Generalized anxiety disorder 04/22/2018   Stable co'nt meds   GERD (gastroesophageal reflux disease) 07/16/2007   Hearing loss 07/24/2012   Refer to hearing clinic   Hiatal hernia 07/16/2007   Hyperglycemia, fasting 05/04/2010   Hyperlipidemia 04/17/2017   Encouraged heart healthy diet, increase exercise, avoid  trans fats, consider a krill oil cap daily   Hypothyroidism 11/02/2010   con't meds; Lab Results Component Value TSH 1.44 08/12/2017   Insomnia 04/30/2007   Knee pain 07/16/2007   Leg pain, bilateral 11/17/2008   Low back pain 07/16/2007   Major depressive disorder 01/30/2018   con't meds F/u counselor   Mild cognitive impairment of uncertain or unknown etiology 01/30/2021   Mixed connective tissue disease    with Raynaud's   Obstructive sleep apnea 01/30/2018   F/u with pulm; May be cause of some of her memory/concentration problems   Osteoarthritis    Osteoporosis    Palpitations 02/12/2008   Pansinusitis 01/30/2018   Peripheral vertigo 04/30/2007   Resolved rto prn   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   Plantar wart 05/06/2008   Restless legs syndrome 12/01/2008   Stomach ulcer    Swallowing difficulty    Type 2 diabetes mellitus with hyperglycemia, without long-term current use of insulin  08/09/2022   Upper respiratory tract infection 12/14/2019   Urge incontinence of urine 10/06/2020   Vaginal discharge 05/25/2021   Vitamin D  deficiency    Wound infection 10/04/2021   Wound of right ankle 09/27/2021     BP 136/76   Pulse 69   Temp 98.2 F (36.8 C)   Ht 5' 2 (1.575 m)   Wt 151 lb (68.5 kg)   SpO2 100%   BMI 27.62 kg/m  General: Awake, alert, appears stated age Heart: RRR Lungs: CTAB, normal respiratory effort, no accessory muscle usage Abd: BS+, soft, NT, ND, no masses or organomegaly MSK: No  CVA tenderness,  Psych: Age appropriate judgment and insight  Essential hypertension  Urinary frequency POCT urine, Urine culture pending Stay hydrated. Seek immediate care if pt starts to develop fevers, new/worsening symptoms, uncontrollable N/V. F/u prn. The patient voiced understanding and agreement to the plan.  Headache Encouraged increased hydration, 64 ounces of clear fluids daily. Minimize alcohol and caffeine. Eat small frequent meals with lean proteins and  complex carbs. Avoid high and low blood sugars. Get adequate sleep, 7-8 hours a night. Needs exercise daily preferably in the morning.    Harlene LITTIE Jolly, DNP, AGNP-C 11/12/24 2:31 PM

## 2024-11-18 ENCOUNTER — Other Ambulatory Visit: Payer: Self-pay | Admitting: Family Medicine

## 2024-11-19 ENCOUNTER — Other Ambulatory Visit: Payer: Self-pay | Admitting: Family Medicine

## 2024-11-23 ENCOUNTER — Other Ambulatory Visit: Payer: Self-pay | Admitting: Family Medicine

## 2024-11-23 ENCOUNTER — Telehealth: Payer: Self-pay

## 2024-11-23 NOTE — Telephone Encounter (Signed)
 Copied from CRM #[id number]. Topic: Clinical - Lab/Test Results >> Nov 23, 2024  3:59 PM Debra [name] wrote: Reason for CRM: Patient called in stating she had additional questions about her lab work and the abnormals she has seen on her test.  CB: [id number]

## 2024-11-23 NOTE — Telephone Encounter (Signed)
 Copied from CRM #[id number]. Topic: Clinical - Medication Refill >> Nov 23, 2024  3:55 PM Mercedes MATSU wrote: Medication: pravastatin  (PRAVACHOL ) 40 MG tablet  levothyroxine  (SYNTHROID ) 75 MCG tablet   Has the patient contacted their pharmacy? Yes (Agent: If no, request that the patient contact the pharmacy for the refill. If patient does not wish to contact the pharmacy document the reason why and proceed with request.) (Agent: If yes, when and what did the pharmacy advise?)  This is the patient's preferred pharmacy:  [id number] - [address], [place] - [address] [place] [address] Phone: [phone] Fax: [phone]  [facility] - [place], [place] - [address] [place] [address] Phone: [phone] Fax: [phone]  Is this the correct pharmacy for this prescription? Yes If no, delete pharmacy and type the correct one.   Has the prescription been filled recently? Yes  Is the patient out of the medication? Yes  Has the patient been seen for an appointment in the last year OR does the patient have an upcoming appointment? Yes  Can we respond through MyChart? Yes  Agent: Please be advised that Rx refills may take up to 3 business days. We ask that you follow-up with your pharmacy.

## 2024-11-24 ENCOUNTER — Other Ambulatory Visit: Payer: Self-pay

## 2024-11-24 DIAGNOSIS — R350 Frequency of micturition: Secondary | ICD-10-CM

## 2024-11-24 DIAGNOSIS — R300 Dysuria: Secondary | ICD-10-CM

## 2024-11-24 MED ORDER — LEVOTHYROXINE SODIUM 75 MCG PO TABS: 75.0000 ug | ORAL_TABLET | Freq: Every day | ORAL | 0 refills | Status: AC

## 2024-11-24 MED ORDER — NITROFURANTOIN MONOHYD MACRO 100 MG PO CAPS: 100.0000 mg | ORAL_CAPSULE | Freq: Two times a day (BID) | ORAL | 0 refills | Status: AC

## 2024-11-24 MED ORDER — PRAVASTATIN SODIUM 40 MG PO TABS: 40.0000 mg | ORAL_TABLET | Freq: Every day | ORAL | 1 refills | Status: AC
